# Patient Record
Sex: Female | Born: 1955 | Race: Black or African American | Hispanic: No | Marital: Single | Smoking: Former smoker
Health system: Southern US, Community
[De-identification: ages and names within clinical notes are randomized; demographics above are authoritative.]

## PROBLEM LIST (undated history)

## (undated) DIAGNOSIS — I5020 Unspecified systolic (congestive) heart failure: Secondary | ICD-10-CM

## (undated) DIAGNOSIS — K759 Inflammatory liver disease, unspecified: Secondary | ICD-10-CM

## (undated) DIAGNOSIS — H02829 Cysts of unspecified eye, unspecified eyelid: Secondary | ICD-10-CM

## (undated) DIAGNOSIS — Z992 Dependence on renal dialysis: Secondary | ICD-10-CM

## (undated) DIAGNOSIS — I429 Cardiomyopathy, unspecified: Secondary | ICD-10-CM

## (undated) DIAGNOSIS — W3400XA Accidental discharge from unspecified firearms or gun, initial encounter: Secondary | ICD-10-CM

## (undated) DIAGNOSIS — N186 End stage renal disease: Secondary | ICD-10-CM

## (undated) DIAGNOSIS — I4891 Unspecified atrial fibrillation: Secondary | ICD-10-CM

## (undated) DIAGNOSIS — M1990 Unspecified osteoarthritis, unspecified site: Secondary | ICD-10-CM

## (undated) DIAGNOSIS — M87051 Idiopathic aseptic necrosis of right femur: Secondary | ICD-10-CM

## (undated) DIAGNOSIS — I209 Angina pectoris, unspecified: Secondary | ICD-10-CM

## (undated) DIAGNOSIS — I10 Essential (primary) hypertension: Secondary | ICD-10-CM

## (undated) DIAGNOSIS — R011 Cardiac murmur, unspecified: Secondary | ICD-10-CM

## (undated) DIAGNOSIS — G8929 Other chronic pain: Secondary | ICD-10-CM

## (undated) DIAGNOSIS — G43909 Migraine, unspecified, not intractable, without status migrainosus: Secondary | ICD-10-CM

## (undated) DIAGNOSIS — F419 Anxiety disorder, unspecified: Secondary | ICD-10-CM

## (undated) DIAGNOSIS — E785 Hyperlipidemia, unspecified: Secondary | ICD-10-CM

## (undated) DIAGNOSIS — IMO0001 Reserved for inherently not codable concepts without codable children: Secondary | ICD-10-CM

## (undated) DIAGNOSIS — J40 Bronchitis, not specified as acute or chronic: Secondary | ICD-10-CM

## (undated) DIAGNOSIS — K219 Gastro-esophageal reflux disease without esophagitis: Secondary | ICD-10-CM

## (undated) DIAGNOSIS — G4730 Sleep apnea, unspecified: Secondary | ICD-10-CM

## (undated) DIAGNOSIS — R569 Unspecified convulsions: Secondary | ICD-10-CM

## (undated) DIAGNOSIS — D649 Anemia, unspecified: Secondary | ICD-10-CM

## (undated) DIAGNOSIS — M549 Dorsalgia, unspecified: Secondary | ICD-10-CM

## (undated) DIAGNOSIS — I639 Cerebral infarction, unspecified: Secondary | ICD-10-CM

## (undated) DIAGNOSIS — Z5189 Encounter for other specified aftercare: Secondary | ICD-10-CM

## (undated) HISTORY — DX: Unspecified systolic (congestive) heart failure: I50.20

## (undated) HISTORY — PX: TOTAL KNEE ARTHROPLASTY: SHX125

## (undated) HISTORY — DX: End stage renal disease: N18.6

## (undated) HISTORY — PX: PARTIAL KNEE ARTHROPLASTY: SHX2174

## (undated) HISTORY — PX: EYE SURGERY: SHX253

## (undated) HISTORY — DX: Idiopathic aseptic necrosis of right femur: M87.051

## (undated) HISTORY — PX: KNEE ARTHROPLASTY: SHX992

## (undated) HISTORY — PX: AMPUTATION FINGER / THUMB: SUR24

## (undated) HISTORY — PX: BACK SURGERY: SHX140

## (undated) HISTORY — DX: Cerebral infarction, unspecified: I63.9

## (undated) HISTORY — DX: Dependence on renal dialysis: Z99.2

---

## 1978-10-04 DIAGNOSIS — W3400XA Accidental discharge from unspecified firearms or gun, initial encounter: Secondary | ICD-10-CM

## 1978-10-04 HISTORY — DX: Accidental discharge from unspecified firearms or gun, initial encounter: W34.00XA

## 1979-06-05 HISTORY — PX: OTHER SURGICAL HISTORY: SHX169

## 1979-10-05 HISTORY — PX: PARTIAL HYSTERECTOMY: SHX80

## 2004-10-04 HISTORY — PX: OTHER SURGICAL HISTORY: SHX169

## 2006-02-22 ENCOUNTER — Emergency Department (HOSPITAL_COMMUNITY): Admission: EM | Admit: 2006-02-22 | Discharge: 2006-02-22 | Payer: Self-pay | Admitting: Emergency Medicine

## 2006-10-04 DIAGNOSIS — I639 Cerebral infarction, unspecified: Secondary | ICD-10-CM

## 2006-10-04 HISTORY — DX: Cerebral infarction, unspecified: I63.9

## 2007-07-18 ENCOUNTER — Emergency Department (HOSPITAL_COMMUNITY): Admission: EM | Admit: 2007-07-18 | Discharge: 2007-07-18 | Payer: Self-pay | Admitting: Emergency Medicine

## 2008-02-10 ENCOUNTER — Emergency Department (HOSPITAL_COMMUNITY): Admission: EM | Admit: 2008-02-10 | Discharge: 2008-02-10 | Payer: Self-pay | Admitting: Emergency Medicine

## 2008-12-02 ENCOUNTER — Emergency Department (HOSPITAL_COMMUNITY): Admission: EM | Admit: 2008-12-02 | Discharge: 2008-12-02 | Payer: Self-pay | Admitting: Emergency Medicine

## 2008-12-10 ENCOUNTER — Emergency Department (HOSPITAL_COMMUNITY): Admission: EM | Admit: 2008-12-10 | Discharge: 2008-12-10 | Payer: Self-pay | Admitting: Emergency Medicine

## 2009-01-28 ENCOUNTER — Observation Stay (HOSPITAL_COMMUNITY): Admission: EM | Admit: 2009-01-28 | Discharge: 2009-01-29 | Payer: Self-pay | Admitting: Emergency Medicine

## 2009-03-19 ENCOUNTER — Emergency Department: Payer: Self-pay | Admitting: Emergency Medicine

## 2009-03-29 ENCOUNTER — Inpatient Hospital Stay: Payer: Self-pay | Admitting: Internal Medicine

## 2009-04-29 ENCOUNTER — Emergency Department (HOSPITAL_COMMUNITY): Admission: EM | Admit: 2009-04-29 | Discharge: 2009-04-29 | Payer: Self-pay | Admitting: Emergency Medicine

## 2009-05-05 ENCOUNTER — Ambulatory Visit (HOSPITAL_COMMUNITY): Admission: RE | Admit: 2009-05-05 | Discharge: 2009-05-05 | Payer: Self-pay | Admitting: Family Medicine

## 2009-05-12 ENCOUNTER — Encounter: Payer: Self-pay | Admitting: Family Medicine

## 2009-06-25 ENCOUNTER — Ambulatory Visit: Payer: Self-pay | Admitting: Internal Medicine

## 2009-06-25 ENCOUNTER — Ambulatory Visit: Payer: Self-pay | Admitting: Orthopedic Surgery

## 2009-06-26 ENCOUNTER — Ambulatory Visit: Payer: Self-pay | Admitting: Orthopedic Surgery

## 2009-10-17 ENCOUNTER — Ambulatory Visit: Payer: Self-pay | Admitting: Orthopedic Surgery

## 2009-10-23 ENCOUNTER — Inpatient Hospital Stay: Payer: Self-pay | Admitting: Orthopedic Surgery

## 2010-01-10 ENCOUNTER — Inpatient Hospital Stay: Payer: Self-pay | Admitting: Internal Medicine

## 2010-01-13 ENCOUNTER — Ambulatory Visit: Payer: Self-pay | Admitting: Cardiovascular Disease

## 2010-02-02 ENCOUNTER — Ambulatory Visit: Payer: Self-pay | Admitting: Orthopedic Surgery

## 2010-02-06 ENCOUNTER — Encounter: Admission: RE | Admit: 2010-02-06 | Discharge: 2010-02-06 | Payer: Self-pay | Admitting: Orthopedic Surgery

## 2010-04-27 ENCOUNTER — Emergency Department (HOSPITAL_COMMUNITY): Admission: EM | Admit: 2010-04-27 | Discharge: 2010-04-27 | Payer: Self-pay | Admitting: Emergency Medicine

## 2010-06-10 ENCOUNTER — Emergency Department: Payer: Self-pay | Admitting: Emergency Medicine

## 2010-07-02 ENCOUNTER — Observation Stay: Payer: Self-pay | Admitting: Internal Medicine

## 2010-07-09 ENCOUNTER — Emergency Department: Payer: Self-pay | Admitting: Emergency Medicine

## 2010-07-26 ENCOUNTER — Emergency Department: Payer: Self-pay | Admitting: Emergency Medicine

## 2010-07-29 ENCOUNTER — Ambulatory Visit: Payer: Self-pay | Admitting: Orthopedic Surgery

## 2010-08-03 ENCOUNTER — Ambulatory Visit: Payer: Self-pay | Admitting: Anesthesiology

## 2010-08-04 ENCOUNTER — Inpatient Hospital Stay: Payer: Self-pay | Admitting: Orthopedic Surgery

## 2010-08-06 LAB — PATHOLOGY REPORT

## 2010-10-04 DIAGNOSIS — I639 Cerebral infarction, unspecified: Secondary | ICD-10-CM

## 2010-10-04 HISTORY — DX: Cerebral infarction, unspecified: I63.9

## 2010-10-11 ENCOUNTER — Inpatient Hospital Stay: Payer: Self-pay | Admitting: Internal Medicine

## 2010-10-17 LAB — PATHOLOGY REPORT

## 2010-10-20 ENCOUNTER — Emergency Department: Payer: Self-pay | Admitting: Unknown Physician Specialty

## 2010-10-24 ENCOUNTER — Emergency Department (HOSPITAL_COMMUNITY)
Admission: EM | Admit: 2010-10-24 | Discharge: 2010-10-24 | Payer: Self-pay | Source: Home / Self Care | Admitting: Emergency Medicine

## 2010-10-25 ENCOUNTER — Emergency Department: Payer: Self-pay | Admitting: Emergency Medicine

## 2010-10-27 LAB — POCT CARDIAC MARKERS
CKMB, poc: <1 ng/mL — ABNORMAL LOW (ref 1.0–8.0)
CKMB, poc: <1 ng/mL — ABNORMAL LOW (ref 1.0–8.0)
Myoglobin, poc: 124 ng/mL (ref 12–200)
Troponin i, poc: <0.05 ng/mL (ref 0.00–0.09)

## 2010-10-27 LAB — BASIC METABOLIC PANEL
BUN: 7 mg/dL (ref 6–23)
Chloride: 108 mEq/L (ref 96–112)
Creatinine, Ser: 1.09 mg/dL (ref 0.4–1.2)
GFR calc Af Amer: >60 mL/min (ref >60)

## 2010-10-27 LAB — DIFFERENTIAL
Eosinophils Relative: 1 % (ref 0–5)
Lymphocytes Relative: 20 % (ref 12–46)
Lymphs Abs: 2.3 10*3/uL (ref 0.7–4.0)
Monocytes Absolute: 0.7 10*3/uL (ref 0.1–1.0)
Neutro Abs: 8.4 10*3/uL — ABNORMAL HIGH (ref 1.7–7.7)

## 2010-10-27 LAB — CBC
HCT: 39.4 % (ref 36.0–46.0)
Hemoglobin: 13.2 g/dL (ref 12.0–15.0)
Platelets: 266 10*3/uL (ref 150–400)
RBC: 5.06 MIL/uL (ref 3.87–5.11)

## 2010-11-04 ENCOUNTER — Emergency Department: Payer: Self-pay | Admitting: Emergency Medicine

## 2011-01-09 LAB — LIPID PANEL
HDL: 49 mg/dL (ref >39)
Total CHOL/HDL Ratio: 2.1 RATIO
Triglycerides: 63 mg/dL (ref <150)
VLDL: 13 mg/dL (ref 0–40)

## 2011-01-09 LAB — COMPREHENSIVE METABOLIC PANEL
ALT: 15 U/L (ref 0–35)
AST: 24 U/L (ref 0–37)
Albumin: 4.2 g/dL (ref 3.5–5.2)
CO2: 30 mEq/L (ref 19–32)
Calcium: 9.6 mg/dL (ref 8.4–10.5)
Creatinine, Ser: 1.11 mg/dL (ref 0.4–1.2)
GFR calc Af Amer: >60 mL/min (ref >60)
GFR calc non Af Amer: 51 mL/min — ABNORMAL LOW (ref >60)
Sodium: 142 mEq/L (ref 135–145)
Total Protein: 8 g/dL (ref 6.0–8.3)

## 2011-01-09 LAB — TSH: TSH: 0.993 u[IU]/mL (ref 0.350–4.500)

## 2011-01-09 LAB — CBC
MCHC: 33.3 g/dL (ref 30.0–36.0)
MCV: 80.4 fL (ref 78.0–100.0)
Platelets: 173 10*3/uL (ref 150–400)
RBC: 4.58 MIL/uL (ref 3.87–5.11)

## 2011-01-13 LAB — DIFFERENTIAL
Basophils Relative: 1 % (ref 0–1)
Eosinophils Relative: 2 % (ref 0–5)
Monocytes Absolute: 0.1 10*3/uL (ref 0.1–1.0)
Monocytes Relative: 3 % (ref 3–12)
Neutro Abs: 2.8 10*3/uL (ref 1.7–7.7)

## 2011-01-13 LAB — CBC
HCT: 31 % — ABNORMAL LOW (ref 36.0–46.0)
HCT: 34.3 % — ABNORMAL LOW (ref 36.0–46.0)
Hemoglobin: 10.3 g/dL — ABNORMAL LOW (ref 12.0–15.0)
MCV: 79.7 fL (ref 78.0–100.0)
Platelets: 196 10*3/uL (ref 150–400)
RDW: 17 % — ABNORMAL HIGH (ref 11.5–15.5)
WBC: 4.6 10*3/uL (ref 4.0–10.5)
WBC: 4.8 10*3/uL (ref 4.0–10.5)

## 2011-01-13 LAB — COMPREHENSIVE METABOLIC PANEL
AST: 23 U/L (ref 0–37)
Albumin: 3.7 g/dL (ref 3.5–5.2)
Alkaline Phosphatase: 71 U/L (ref 39–117)
Alkaline Phosphatase: 78 U/L (ref 39–117)
BUN: 12 mg/dL (ref 6–23)
BUN: 14 mg/dL (ref 6–23)
CO2: 26 mEq/L (ref 19–32)
Chloride: 109 mEq/L (ref 96–112)
Chloride: 110 mEq/L (ref 96–112)
GFR calc Af Amer: >60 mL/min (ref >60)
GFR calc non Af Amer: 58 mL/min — ABNORMAL LOW (ref >60)
Glucose, Bld: 105 mg/dL — ABNORMAL HIGH (ref 70–99)
Potassium: 3.4 mEq/L — ABNORMAL LOW (ref 3.5–5.1)
Potassium: 3.8 mEq/L (ref 3.5–5.1)
Sodium: 142 mEq/L (ref 135–145)
Total Bilirubin: 0.2 mg/dL — ABNORMAL LOW (ref 0.3–1.2)
Total Bilirubin: 0.5 mg/dL (ref 0.3–1.2)
Total Protein: 7.2 g/dL (ref 6.0–8.3)

## 2011-01-13 LAB — POCT CARDIAC MARKERS
Myoglobin, poc: 57.8 ng/mL (ref 12–200)
Myoglobin, poc: 63.9 ng/mL (ref 12–200)
Troponin i, poc: <0.05 ng/mL (ref 0.00–0.09)
Troponin i, poc: <0.05 ng/mL (ref 0.00–0.09)
Troponin i, poc: <0.05 ng/mL (ref 0.00–0.09)

## 2011-01-13 LAB — TROPONIN I: Troponin I: <0.01 ng/mL (ref 0.00–0.06)

## 2011-01-14 LAB — DIFFERENTIAL
Eosinophils Absolute: 0 10*3/uL (ref 0.0–0.7)
Lymphocytes Relative: 8 % — ABNORMAL LOW (ref 12–46)
Lymphs Abs: 0.7 10*3/uL (ref 0.7–4.0)
Neutro Abs: 7 10*3/uL (ref 1.7–7.7)
Neutrophils Relative %: 87 % — ABNORMAL HIGH (ref 43–77)

## 2011-01-14 LAB — POCT CARDIAC MARKERS
CKMB, poc: <1 ng/mL — ABNORMAL LOW (ref 1.0–8.0)
Myoglobin, poc: 122 ng/mL (ref 12–200)

## 2011-01-14 LAB — CBC
MCV: 80.7 fL (ref 78.0–100.0)
Platelets: 156 10*3/uL (ref 150–400)
WBC: 8.1 10*3/uL (ref 4.0–10.5)

## 2011-01-22 ENCOUNTER — Emergency Department: Payer: Self-pay | Admitting: Emergency Medicine

## 2011-01-25 ENCOUNTER — Observation Stay: Payer: Self-pay | Admitting: Internal Medicine

## 2011-02-16 NOTE — Discharge Summary (Signed)
NAMESURYA, Audrey Hill           ACCOUNT NO.:  [id number]   MEDICAL RECORD NO.:  [id number]          PATIENT TYPE:  OBV   LOCATION:  3033                         FACILITY:  [facility]   PHYSICIAN:  Helen Hashimoto, MD    DATE OF BIRTH:  09/09/1956   DATE OF ADMISSION:  01/27/2009  DATE OF DISCHARGE:  01/29/2009                               DISCHARGE SUMMARY   DISCHARGE DIAGNOSES:  1. Neck pain most likely secondary to musculoskeletal causes.  2. Depression.  3. Hyperlipidemia.  4. Hypertension.  5. Gastroesophageal reflux disease.  6. History of asthma.   DISCHARGE MEDICATIONS:  1. Vicodin 1-2 tablets p.o. q.4 hours as needed.  2. Prilosec 20 mg once a day.  3. Singulair 10 mg p.o. once daily.  4. Lisinopril same medication same dose taken at home.  5. Lipitor same dose taken at home.  6. Lopressor same dose taken at home.  7. Zoloft same dose taken at home.   CONSULTATIONS:  None.   PROCEDURES:  None.   DIAGNOSTIC STUDIES:  1. Chest x-ray on January 27, 2009 showed cardiomegaly without acute      disease.  2. CT scan of the head without contrast showed no acute problem.  3. MRI of the cervical spine without contrast showed mild to moderate      cervical facet hypertrophy, most pronounced at C3-C4 and C4-C5 and      mild disk degeneration for age and mild-to-moderate multifactorial      neural foraminal stenosis most pronounced at C5 on the left      bilaterally and on C6.  Follow up with primary doctor within 1-2      weeks.   COURSE OF HOSPITALIZATION:  Neck pain.  This is a 55 year old female who  presented to the hospital with diffuse body aches, especially in her  neck.  The patient was admitted to the hospital for further evaluation.  She was started on pain medicine.  The patient improved during her  hospitalization.  At the present time at the time of discharge her pain  is around 2/10 compared to 9/10 at the time of discharge.  The patient  underwent MRI of the  neck and results were mentioned above.  Her current  neck pain might be related to the cervical hypertrophy seen in her MRI.  At the present time, we will treat with oral Vicodin as needed in  addition to physical therapy to her neck.  I discussed those findings  with her and advised her to follow with her primary doctor for further  assessment.   Otherwise, other medical conditions remained stable during this  hospitalization.  The patient was continued on all preadmission  medications.  She was given prescription for 20 tablets of Vicodin..  The patient does not know most of the doses of her medications.  I tried  to get the number of her pharmacy but she does not know which ones she  goes to.  She does not have any bottles with her medications.  At the  present time, I just advised her to continue her current medications in  the same way that prescribed by her physician.   TOTAL ASSESSMENT TIME:  40 minutes.      Helen Hashimoto, MD  Electronically Signed     NAE/MEDQ  D:  01/29/2009  T:  01/29/2009  Job:  [id number]

## 2011-02-16 NOTE — H&P (Signed)
NAMEJAYLINN, CICCARELLI NO.:  [id number]   MEDICAL RECORD NO.:  [id number]          PATIENT TYPE:  OBV   LOCATION:  3033                         FACILITY:  [facility]   PHYSICIAN:  Orpah Melter, MD       DATE OF BIRTH:  Jul 12, 1956   DATE OF ADMISSION:  01/27/2009  DATE OF DISCHARGE:                              HISTORY & PHYSICAL   The patient is unassigned.  She is out of town visiting a relative.   HISTORY OF PRESENT ILLNESS:  The patient is a 55 year old female with  complaints of left neck, left chest, and left leg pain x48 hours prior  to admission.  The patient also reports left leg weakness and  paresthesias intermittently in the 1-2 months prior to admission.  The  patient did have a previous incident of chest pain last year, which was  similar to this, although did not involve her leg.  At that time, the  patient was thought to be suffering from gastroesophageal reflux disease  and therefore did not undergo stress testing.  The patient states they  thought about it.  The patient has had negative enzymes, negative EKG,  negative chest x-ray with the exception of cardiomegaly.  Her symptoms  are more consistent with a radiculopathy than acute coronary syndrome.  I do not believe the patient is suffering from acute coronary syndrome.  The patient will be admitted for observation and to receive a cervical  MRI in a.m.   PAST MEDICAL HISTORY:  1. Obesity.  2. Depression.  3. Prior back surgery in the lumbar area.  4. Gastroesophageal reflux disease.  5. Hyperlipidemia.  6. Hypertension.  7. Anxiety.  8. Asthma.   SOCIAL HISTORY:  The patient continues to smoke and is a current smoker,  but denies alcohol or drug use.   FAMILY HISTORY:  Not available.   REVIEW OF SYSTEMS:  All other comprehensive review of systems are  negative.   MEDICATIONS:  The patient reports allergies to ASPIRIN, which causes GI  upset and NITROGLYCERIN, which causes swelling.   CURRENT MEDICATIONS:  1. Prilosec 20 mg p.o. daily.  2. Singulair 10 mg p.o. daily.  3. Lisinopril.  4. Lipitor.  5. Lopressor.  6. Zoloft, those dosages and frequencies or not available.   PHYSICAL EXAMINATION:  GENERAL:  This is a morbidly obese black female,  currently in no apparent distress.  VITAL SIGNS:  Blood pressure 130/65, heart rate 62, respiratory rate 22,  and temperature 97.  HEENT:  No jugular venous distention or lymphadenopathy.  Oropharynx is  clear.  Mucous membranes pink and moist.  TMs clear bilaterally.  Pupils  equal and reactive to light and accommodation.  Extraocular muscles are  intact.  CARDIOVASCULAR:  Regular rate and rhythm without murmurs, rubs, or  gallops.  PULMONARY:  Lungs are clear to auscultation bilaterally.  ABDOMEN:  Obese, nontender, and nondistended without hepatosplenomegaly.  Bowel sounds are present.  She has no rebound or guarding.  EXTREMITIES:  She has no clubbing, cyanosis, or edema.  She has good  peripheral pulses in dorsalis pedis and radial arteries.  She  is able to  move all extremities.  NEUROLOGIC:  Cranial nerves II-XII are grossly intact.  She has no focal  neurological deficit.  She does have slightly increased reflexes at the  patella with left in comparison to right; however, she has no evidence  of clonus and she has no evidence of weakness.   STUDIES:  1. EKG shows normal sinus rhythm with increased QTc interval and      premature ventricular contraction.  2. Chest x-ray shows no acute disease with the exception of chronic      cardiomegaly.  3. CT of the head shows no acute disease.   LABORATORY DATA:  White count 4.8, H and H 11/34, MCV 80, platelets 196.  Sodium 142, potassium 3.8, chloride 110, bicarb 28, BUN 14, creatinine  0.9, glucose 112.  Troponin I less than 0.05.   ASSESSMENT AND PLAN:  1. Neck, chest, and leg pain x48 hours prior to admission.  I do not      believe this represents acute coronary  syndrome.  We will check      cervical spine in a.m.  2. Depression.  We will hold Zoloft till dosage is available.  The      patient does not know what that is.  3. Hyperlipidemia.  We will hold statin therapy until a.m. until      dosage and frequency are available.  4. Hypertension.  We will hold her Lopressor and lisinopril until a.m.  5. Gastroesophageal reflux disease.  We will give the patient      Protonix.  6. Asthma.  Continue Singulair.   DISPOSITION:  Likely discharge in a.m. after MRI.  I believe the patient  is fixating predominantly on her pain symptoms, which seem to be out of  proportion to examination and physical findings, and this may be a  secondary reason why the patient wishes to be admitted.  We will  reevaluate after further testing in a.m.   H and P was constructed by reviewing past medical history, conferring  with emergency medical room physician, and reviewing the emergency  medical record.  Time spent 1 hour.      Orpah Melter, MD  Electronically Signed     JF/MEDQ  D:  01/28/2009  T:  01/28/2009  Job:  [id number]

## 2011-02-22 ENCOUNTER — Other Ambulatory Visit: Payer: Self-pay | Admitting: Family Medicine

## 2011-02-22 DIAGNOSIS — Z1231 Encounter for screening mammogram for malignant neoplasm of breast: Secondary | ICD-10-CM

## 2011-02-26 ENCOUNTER — Ambulatory Visit: Payer: Self-pay

## 2011-03-09 ENCOUNTER — Ambulatory Visit
Admission: RE | Admit: 2011-03-09 | Discharge: 2011-03-09 | Disposition: A | Discharge status: Home / Self Care | Payer: Medicare Other | Source: Ambulatory Visit | Attending: Family Medicine | Admitting: Family Medicine

## 2011-03-09 DIAGNOSIS — Z1231 Encounter for screening mammogram for malignant neoplasm of breast: Secondary | ICD-10-CM

## 2011-07-05 ENCOUNTER — Ambulatory Visit: Payer: Self-pay | Admitting: Orthopedic Surgery

## 2011-07-15 LAB — I-STAT 8, (EC8 V) (CONVERTED LAB)
Acid-Base Excess: 2
Chloride: 108
HCT: 38
Potassium: 4.3
pH, Ven: 7.524 — ABNORMAL HIGH

## 2011-07-15 LAB — POCT CARDIAC MARKERS
Operator id: 272551
Troponin i, poc: <0.05

## 2011-07-15 LAB — D-DIMER, QUANTITATIVE: D-Dimer, Quant: <0.22

## 2011-07-15 LAB — POCT I-STAT CREATININE: Creatinine, Ser: 1.1

## 2011-08-23 ENCOUNTER — Other Ambulatory Visit: Payer: Self-pay

## 2011-08-23 ENCOUNTER — Encounter: Payer: Self-pay | Admitting: *Deleted

## 2011-08-23 ENCOUNTER — Inpatient Hospital Stay (HOSPITAL_COMMUNITY)
Admission: EM | Admit: 2011-08-23 | Discharge: 2011-08-24 | DRG: 313 | Disposition: A | Discharge status: Home / Self Care | Payer: Medicare Other | Attending: Family Medicine | Admitting: Family Medicine

## 2011-08-23 ENCOUNTER — Emergency Department (HOSPITAL_COMMUNITY): Payer: Medicare Other

## 2011-08-23 DIAGNOSIS — R072 Precordial pain: Principal | ICD-10-CM | POA: Diagnosis present

## 2011-08-23 DIAGNOSIS — H02829 Cysts of unspecified eye, unspecified eyelid: Secondary | ICD-10-CM

## 2011-08-23 DIAGNOSIS — E785 Hyperlipidemia, unspecified: Secondary | ICD-10-CM | POA: Insufficient documentation

## 2011-08-23 DIAGNOSIS — I10 Essential (primary) hypertension: Secondary | ICD-10-CM | POA: Diagnosis present

## 2011-08-23 DIAGNOSIS — F419 Anxiety disorder, unspecified: Secondary | ICD-10-CM | POA: Insufficient documentation

## 2011-08-23 DIAGNOSIS — F17200 Nicotine dependence, unspecified, uncomplicated: Secondary | ICD-10-CM

## 2011-08-23 DIAGNOSIS — R0602 Shortness of breath: Secondary | ICD-10-CM

## 2011-08-23 DIAGNOSIS — J45909 Unspecified asthma, uncomplicated: Secondary | ICD-10-CM | POA: Insufficient documentation

## 2011-08-23 DIAGNOSIS — E039 Hypothyroidism, unspecified: Secondary | ICD-10-CM | POA: Diagnosis present

## 2011-08-23 DIAGNOSIS — E669 Obesity, unspecified: Secondary | ICD-10-CM

## 2011-08-23 DIAGNOSIS — M25569 Pain in unspecified knee: Secondary | ICD-10-CM | POA: Diagnosis present

## 2011-08-23 DIAGNOSIS — R569 Unspecified convulsions: Secondary | ICD-10-CM | POA: Insufficient documentation

## 2011-08-23 DIAGNOSIS — K219 Gastro-esophageal reflux disease without esophagitis: Secondary | ICD-10-CM | POA: Insufficient documentation

## 2011-08-23 DIAGNOSIS — R0789 Other chest pain: Secondary | ICD-10-CM | POA: Diagnosis present

## 2011-08-23 DIAGNOSIS — K759 Inflammatory liver disease, unspecified: Secondary | ICD-10-CM | POA: Insufficient documentation

## 2011-08-23 DIAGNOSIS — Z79899 Other long term (current) drug therapy: Secondary | ICD-10-CM

## 2011-08-23 HISTORY — DX: Migraine, unspecified, not intractable, without status migrainosus: G43.909

## 2011-08-23 HISTORY — DX: Anemia, unspecified: D64.9

## 2011-08-23 HISTORY — DX: Essential (primary) hypertension: I10

## 2011-08-23 HISTORY — DX: Cysts of unspecified eye, unspecified eyelid: H02.829

## 2011-08-23 HISTORY — DX: Encounter for other specified aftercare: Z51.89

## 2011-08-23 HISTORY — DX: Gastro-esophageal reflux disease without esophagitis: K21.9

## 2011-08-23 HISTORY — DX: Inflammatory liver disease, unspecified: K75.9

## 2011-08-23 HISTORY — DX: Anxiety disorder, unspecified: F41.9

## 2011-08-23 HISTORY — DX: Bronchitis, not specified as acute or chronic: J40

## 2011-08-23 HISTORY — DX: Reserved for inherently not codable concepts without codable children: IMO0001

## 2011-08-23 HISTORY — DX: Hyperlipidemia, unspecified: E78.5

## 2011-08-23 HISTORY — DX: Unspecified convulsions: R56.9

## 2011-08-23 HISTORY — DX: Accidental discharge from unspecified firearms or gun, initial encounter: W34.00XA

## 2011-08-23 HISTORY — DX: Angina pectoris, unspecified: I20.9

## 2011-08-23 LAB — CARDIAC PANEL(CRET KIN+CKTOT+MB+TROPI)
CK, MB: 2.1 ng/mL (ref 0.3–4.0)
Total CK: 198 U/L — ABNORMAL HIGH (ref 7–177)
Troponin I: <0.3 ng/mL (ref <0.30)

## 2011-08-23 LAB — POCT I-STAT, CHEM 8
BUN: 8 mg/dL (ref 6–23)
Calcium, Ion: 1.16 mmol/L (ref 1.12–1.32)
Chloride: 107 mEq/L (ref 96–112)
Creatinine, Ser: 0.9 mg/dL (ref 0.50–1.10)
Glucose, Bld: 77 mg/dL (ref 70–99)

## 2011-08-23 LAB — CBC
HCT: 36.8 % (ref 36.0–46.0)
MCV: 80 fL (ref 78.0–100.0)
RBC: 4.6 MIL/uL (ref 3.87–5.11)
WBC: 4.8 10*3/uL (ref 4.0–10.5)

## 2011-08-23 LAB — DIFFERENTIAL
Eosinophils Relative: 2 % (ref 0–5)
Lymphocytes Relative: 39 % (ref 12–46)
Lymphs Abs: 1.9 10*3/uL (ref 0.7–4.0)
Monocytes Absolute: 0.2 10*3/uL (ref 0.1–1.0)
Monocytes Relative: 5 % (ref 3–12)

## 2011-08-23 MED ORDER — FUROSEMIDE 20 MG PO TABS
20.0000 mg | ORAL_TABLET | Freq: Every day | ORAL | Status: DC
  Administered 2011-08-24: 20 mg via ORAL
  Filled 2011-08-23: qty 1

## 2011-08-23 MED ORDER — SODIUM CHLORIDE 0.9 % IJ SOLN: 3.0000 mL | INTRAMUSCULAR | Status: DC | PRN

## 2011-08-23 MED ORDER — SODIUM CHLORIDE 0.9 % IV SOLN
250.0000 mL | INTRAVENOUS | Status: DC
  Administered 2011-08-23: 250 mL via INTRAVENOUS

## 2011-08-23 MED ORDER — METOPROLOL TARTRATE 1 MG/ML IV SOLN: 5.0000 mg | INTRAVENOUS | Status: DC | PRN

## 2011-08-23 MED ORDER — POTASSIUM CHLORIDE CRYS ER 20 MEQ PO TBCR
40.0000 meq | EXTENDED_RELEASE_TABLET | Freq: Once | ORAL | Status: AC
  Administered 2011-08-23: 40 meq via ORAL
  Filled 2011-08-23: qty 1

## 2011-08-23 MED ORDER — MORPHINE SULFATE 4 MG/ML IJ SOLN: 4.0000 mg | Freq: Once | INTRAMUSCULAR | Status: DC

## 2011-08-23 MED ORDER — SODIUM CHLORIDE 0.9 % IJ SOLN
3.0000 mL | Freq: Two times a day (BID) | INTRAMUSCULAR | Status: DC
  Administered 2011-08-23 – 2011-08-24 (×2): 3 mL via INTRAVENOUS

## 2011-08-23 MED ORDER — AMLODIPINE BESYLATE 10 MG PO TABS
10.0000 mg | ORAL_TABLET | Freq: Every day | ORAL | Status: DC
  Administered 2011-08-24: 10 mg via ORAL
  Filled 2011-08-23: qty 1

## 2011-08-23 MED ORDER — HEPARIN BOLUS VIA INFUSION
4000.0000 [IU] | Freq: Once | INTRAVENOUS | Status: AC
  Administered 2011-08-23: 4000 [IU] via INTRAVENOUS
  Filled 2011-08-23: qty 4000

## 2011-08-23 MED ORDER — NITROGLYCERIN 2 % TD OINT: 0.5000 [in_us] | TOPICAL_OINTMENT | Freq: Four times a day (QID) | TRANSDERMAL | Status: DC

## 2011-08-23 MED ORDER — SIMVASTATIN 20 MG PO TABS
20.0000 mg | ORAL_TABLET | Freq: Every day | ORAL | Status: DC
  Filled 2011-08-23: qty 1

## 2011-08-23 MED ORDER — ONDANSETRON HCL 4 MG/2ML IJ SOLN
4.0000 mg | Freq: Four times a day (QID) | INTRAMUSCULAR | Status: DC | PRN
  Administered 2011-08-24 (×2): 4 mg via INTRAVENOUS
  Filled 2011-08-23 (×2): qty 2

## 2011-08-23 MED ORDER — TEMAZEPAM 15 MG PO CAPS
15.0000 mg | ORAL_CAPSULE | Freq: Every evening | ORAL | Status: DC | PRN
  Administered 2011-08-23: 15 mg via ORAL
  Filled 2011-08-23: qty 1

## 2011-08-23 MED ORDER — ONDANSETRON HCL 4 MG/2ML IJ SOLN
4.0000 mg | INTRAMUSCULAR | Status: DC | PRN
  Administered 2011-08-23: 4 mg via INTRAVENOUS
  Filled 2011-08-23: qty 2

## 2011-08-23 MED ORDER — MORPHINE SULFATE 4 MG/ML IJ SOLN
4.0000 mg | INTRAMUSCULAR | Status: DC | PRN
  Administered 2011-08-23 – 2011-08-24 (×4): 4 mg via INTRAVENOUS
  Filled 2011-08-23 (×4): qty 1

## 2011-08-23 MED ORDER — MORPHINE SULFATE 4 MG/ML IJ SOLN
6.0000 mg | INTRAMUSCULAR | Status: AC | PRN
  Administered 2011-08-23: 6 mg via INTRAVENOUS
  Administered 2011-08-23: 4 mg via INTRAVENOUS
  Filled 2011-08-23: qty 1
  Filled 2011-08-23: qty 2

## 2011-08-23 MED ORDER — POTASSIUM CHLORIDE 20 MEQ PO PACK: 40.0000 meq | PACK | Freq: Once | ORAL | Status: DC

## 2011-08-23 MED ORDER — HEPARIN (PORCINE) IN NACL 100-0.45 UNIT/ML-% IJ SOLN
1000.0000 [IU]/h | INTRAMUSCULAR | Status: DC
  Administered 2011-08-23 (×2): 1000 [IU]/h via INTRAVENOUS
  Filled 2011-08-23: qty 250

## 2011-08-23 MED ORDER — NICOTINE 7 MG/24HR TD PT24
7.0000 mg | MEDICATED_PATCH | Freq: Every day | TRANSDERMAL | Status: DC
  Administered 2011-08-23 – 2011-08-24 (×2): 7 mg via TRANSDERMAL
  Filled 2011-08-23 (×2): qty 1

## 2011-08-23 MED ORDER — ACETAMINOPHEN 325 MG PO TABS
650.0000 mg | ORAL_TABLET | ORAL | Status: DC | PRN
  Filled 2011-08-23: qty 2

## 2011-08-23 MED ORDER — GI COCKTAIL ~~LOC~~
30.0000 mL | Freq: Once | ORAL | Status: AC
  Administered 2011-08-23: 30 mL via ORAL
  Filled 2011-08-23: qty 30

## 2011-08-23 NOTE — ED Notes (Signed)
Pt here with c/o left knee pain that started last night and has become worse.  Pt reports knee replacement 1 yr pain and ago.  Since her arrival, pt also c/o center chest pain as well as sob.  Pt demies n/v and diaphoresis.  Pt rates pain 8/10.
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
## 2011-08-23 NOTE — ED Notes (Signed)
To ed for eval of cp and left knee pain. Pt was coming to the hospital for the knee pain when she became sob and her chest started to hurt.

## 2011-08-23 NOTE — ED Notes (Signed)
Pt. Placed on pulse ox and heart monitor
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
## 2011-08-23 NOTE — Progress Notes (Signed)
*  PRELIMINARY RESULTS*  Left Lower Extremity Venous Duplex has been performed. No obvious DVT seen in Left leg.  Landry Mellow 08/23/2011, 2:05 PM

## 2011-08-23 NOTE — ED Provider Notes (Signed)
History     CSN: [id number] Arrival date & time: 08/23/2011 11:41 AM   First MD Initiated Contact with Patient 08/23/11 1207      Chief Complaint  Patient presents with  . Chest Pain  . Knee Pain    (Consider location/radiation/quality/duration/timing/severity/associated sxs/prior treatment) HPI Comments: The patient presents with chief complaint of chest pain with a history significant of hypertension, hyperlipidemia, and smoking x30 years. Patient states her chest pain started earlier today about an hour before arrival. The pain has been constant and squeezing in nature. The pain radiates down her left arm. The patient states she's never had pain like this before. Patient denies having a cardiac workup in the past. She's never had a stress test or echo. Patient's primary care doctor is Dr. Kennon Holter but the patient does not have a cardiologist. Patient reports new onset of shortness of breath, dyspnea on exertion, diaphoresis but denies palpitations, syncope, PND, hemoptysis, abdominal pain or history of COPD. She also denies a history of unilateral leg swelling any peripheral edema and history of clotting. In addition the patient complains of left knee pain. 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 She states that she's had pain since the surgery however in addition to this she is having some lower leg calf pain that is sharp. Patient has no other chief complaints.  Patient is a 55 y.o. female presenting with chest pain and knee pain. The history is provided by the patient.  Chest Pain Primary symptoms include shortness of breath. Pertinent negatives for primary symptoms include no fever, no fatigue, no cough, no wheezing, no palpitations, no abdominal pain, no nausea, no vomiting and no dizziness.  Pertinent negatives for associated symptoms include no diaphoresis.    Knee Pain Associated symptoms include chest pain. Pertinent negatives include no abdominal pain, chills, congestion,  coughing, diaphoresis, fatigue, fever, headaches, myalgias, nausea, neck pain or vomiting.    Past Medical History  Diagnosis Date  . Hypertension     History reviewed. No pertinent past surgical history.  History reviewed. No pertinent family history.  History  Substance Use Topics  . Smoking status: Not on file  . Smokeless tobacco: Not on file  . Alcohol Use:     OB History    Grav Para Term Preterm Abortions TAB SAB Ect Mult Living                  Review of Systems  Constitutional: Positive for activity change. Negative for fever, chills, diaphoresis, fatigue and unexpected weight change.  HENT: Negative for congestion, neck pain and neck stiffness.   Eyes: Negative for visual disturbance.  Respiratory: Positive for shortness of breath. Negative for apnea, cough, chest tightness, wheezing and stridor.   Cardiovascular: Positive for chest pain. Negative for palpitations and leg swelling.  Gastrointestinal: Negative for nausea, vomiting, abdominal pain, diarrhea and blood in stool.  Genitourinary: Negative for dysuria, urgency, hematuria and flank pain.  Musculoskeletal: Positive for gait problem. Negative for myalgias and back pain.  Skin: Negative for pallor.  Neurological: Negative for dizziness, syncope, light-headedness and headaches.  Hematological: Does not bruise/bleed easily.  All other systems reviewed and are negative.    Allergies  Benadryl; Ibuprofen; and Nitroglycerin  Home Medications   Current Outpatient Rx  Name Route Sig Dispense Refill  . AMLODIPINE BESYLATE 10 MG PO TABS Oral Take 10 mg by mouth daily.      . FUROSEMIDE 20 MG PO TABS Oral Take 20 mg by mouth  daily.      Marland Kitchen NABUMETONE 500 MG PO TABS Oral Take 500 mg by mouth 2 (two) times daily.      Marland Kitchen OMEPRAZOLE 20 MG PO CPDR Oral Take 20 mg by mouth daily.      Marland Kitchen PRAVASTATIN SODIUM 40 MG PO TABS Oral Take 40 mg by mouth daily.      Marland Kitchen TEMAZEPAM 15 MG PO CAPS Oral Take 15 mg by mouth at  bedtime as needed. For sleep     . TRAMADOL HCL 50 MG PO TABS Oral Take 50 mg by mouth at bedtime as needed. For sleep. Maximum dose= 8 tablets per day       BP 143/70  Pulse 84  Temp(Src) 98.4 F (36.9 C) (Oral)  Resp 15  SpO2 97%  Physical Exam  Nursing note and vitals reviewed. Constitutional: She is oriented to person, place, and time. She appears well-developed and well-nourished. No distress.  HENT:  Head: Normocephalic and atraumatic.  Eyes: Conjunctivae and EOM are normal.       Normal appearance  Neck: Normal range of motion.  Cardiovascular: Normal rate, regular rhythm, normal heart sounds and intact distal pulses.   Pulmonary/Chest: Effort normal and breath sounds normal. No respiratory distress. She exhibits no tenderness.  Abdominal: Soft. Bowel sounds are normal. She exhibits no distension. There is no tenderness. There is no guarding.  Musculoskeletal: She exhibits no edema and no tenderness.       Left knee: She exhibits decreased range of motion. She exhibits no swelling, no effusion and no ecchymosis. tenderness found.       Legs:      2+ Dorsalis Pedis pulses  Neurological: She is alert and oriented to person, place, and time.  Skin: Skin is warm and dry. No rash noted. She is not diaphoretic.  Psychiatric: She has a normal mood and affect. Her behavior is normal.    ED Course  Procedures (including critical care time)   Labs Reviewed  CBC  DIFFERENTIAL  TROPONIN I  I-STAT TROPONIN I  I-STAT, CHEM 8   Dg Chest 2 View  08/23/2011  *RADIOLOGY REPORT*  Clinical Data: Chest pain and weakness.  CHEST - 2 VIEW  Comparison: 10/24/2010.  Findings: Trachea is midline.  Heart is mildly enlarged, stable. Lungs are clear.  No pleural fluid.  Metallic densities project over the upper chest, as before.  IMPRESSION: No acute findings.  Original Report Authenticated By: Luretha Rued, M.D.   Patient's blood hemolyzed in the lab 2 times before being able to results  of the troponin and the i-STAT chem 8. This is prolonged the patient's ED visit and my ability to admit the patient. This has been discussed with many ladders while the lab downstairs.  Note the patient has a family history significant for multiple cardiac ischemic attacks. Her mother had her first heart attack at 70 being killed by a massive MI at 84 and her brother age 74 just had a triple bypass surgery  Patient is currently hemodynamically stable, not in pain, and has no complaints  Admit pt to family practice; Spoke w Dr.Williamson telemetry bed, CP/SOB, in pt   MDM  Chest pain  SOB        [place], [place] 08/23/11 1757

## 2011-08-23 NOTE — ED Notes (Signed)
Ordered pt a dinner tray, heart healthy

## 2011-08-24 ENCOUNTER — Other Ambulatory Visit: Payer: Self-pay

## 2011-08-24 DIAGNOSIS — E669 Obesity, unspecified: Secondary | ICD-10-CM

## 2011-08-24 DIAGNOSIS — R0789 Other chest pain: Secondary | ICD-10-CM

## 2011-08-24 LAB — CBC
HCT: 34.1 % — ABNORMAL LOW (ref 36.0–46.0)
Hemoglobin: 11.2 g/dL — ABNORMAL LOW (ref 12.0–15.0)
MCH: 26.6 pg (ref 26.0–34.0)
MCHC: 32.8 g/dL (ref 30.0–36.0)

## 2011-08-24 LAB — CARDIAC PANEL(CRET KIN+CKTOT+MB+TROPI)
CK, MB: 2.3 ng/mL (ref 0.3–4.0)
CK, MB: 2.3 ng/mL (ref 0.3–4.0)
Relative Index: 1.4 (ref 0.0–2.5)
Total CK: 180 U/L — ABNORMAL HIGH (ref 7–177)

## 2011-08-24 LAB — DRUGS OF ABUSE SCREEN W/O ALC, ROUTINE URINE
Amphetamine Screen, Ur: NEGATIVE
Benzodiazepines.: NEGATIVE
Marijuana Metabolite: NEGATIVE
Methadone: NEGATIVE
Opiate Screen, Urine: POSITIVE — AB
Phencyclidine (PCP): NEGATIVE

## 2011-08-24 LAB — BASIC METABOLIC PANEL
BUN: 13 mg/dL (ref 6–23)
Chloride: 106 mEq/L (ref 96–112)
Glucose, Bld: 99 mg/dL (ref 70–99)
Potassium: 3.4 mEq/L — ABNORMAL LOW (ref 3.5–5.1)

## 2011-08-24 LAB — TSH: TSH: 4.797 u[IU]/mL — ABNORMAL HIGH (ref 0.350–4.500)

## 2011-08-24 LAB — LIPID PANEL
Cholesterol: 170 mg/dL (ref 0–200)
VLDL: 36 mg/dL (ref 0–40)

## 2011-08-24 LAB — HEMOGLOBIN A1C: Hgb A1c MFr Bld: 5.7 % — ABNORMAL HIGH (ref <5.7)

## 2011-08-24 NOTE — Discharge Summary (Signed)
I have seen and examined this patient. I have discussed with Dr Williamson.  I agree with their findings and plans as documented in their discharge note for today.  

## 2011-08-24 NOTE — Consult Note (Signed)
Pt smokes 5 cigarettes per day. She is in contemplation stage and  says she'll eventually quit on her own but not setting a quit date now. Pt voices understanding of risk factors. Referred to 1-800 quit now for f/u and support. Discussed oral fixation substitutes, second hand smoke and in home smoking policy. Reviewed and gave pt Written education/contact information.

## 2011-08-24 NOTE — Progress Notes (Signed)
Daily Progress Note Audrey Hill. Maricela Bo, M.D., M.B.A  Family Medicine PGY-1     Subjective: Denies chest pain, shortness of breath; Endorses left knee pain that is acute on chronic, mild nausea; patient not using supplemental oxygen   Objective: Vital signs in last 24 hours: Temp:  [98.1 F (36.7 C)-98.4 F (36.9 C)] 98.1 F (36.7 C) (11/20 0515) Pulse Rate:  [69-85] 69  (11/20 0515) Resp:  [15-20] 20  (11/20 0515) BP: (114-145)/(64-98) 135/79 mmHg (11/20 0515) SpO2:  [94 %-100 %] 94 % (11/20 0515) Weight:  [222 lb 0.1 oz (100.7 kg)-222 lb 3.6 oz (100.8 kg)] 222 lb 0.1 oz (100.7 kg) (11/20 0515) Weight change:  Last BM Date: 08/22/11   Intake/Output from previous day: 11/19 0701 - 11/20 0700 In: 422.3 [P.O.:240; I.V.:182.3] Out: -  Intake/Output this shift:    General appearance: alert, cooperative, no distress and mildly obese Resp: clear to auscultation bilaterally Chest wall: no tenderness Cardio: regular rate and rhythm, S1, S2 normal, no murmur, click, rub or gallop GI: soft, non-tender; bowel sounds normal; no masses,  no organomegaly Extremities: edema none and left knee with large lateral scar, crepitus, and pain with AROM, no edema or erythema or redness Throat: no thyromegaly or nodules appreciated   Lab Results:  Basename 08/24/11 0640 08/23/11 1711 08/23/11 1247  WBC 4.1 -- 4.8  HGB 11.2* 13.3 --  HCT 34.1* 39.0 --  PLT 200 -- 222   Trop I < 0.30 x 3 TSH: 4.797 - high  UDS - pending  HbA1C - 5.7  Lipid Panel     Component Value Date/Time   CHOL 170 08/24/2011 0640   TRIG 182* 08/24/2011 0640   HDL 56 08/24/2011 0640   CHOLHDL 3.0 08/24/2011 0640   VLDL 36 08/24/2011 0640   LDLCALC 78 08/24/2011 0640      Studies/Results:  EKG from 11/19 - regular rate, occassional PVC's, non-specific ST changes  EKG 08/24/11 - rate 64 BPM. Sinus rhythm, LVH, non-specific ST changes   Medications:  Scheduled:   . amLODipine  10 mg Oral Daily  .  furosemide  20 mg Oral Daily  . gi cocktail  30 mL Oral Once  . heparin  4,000 Units Intravenous Once  . nicotine  7 mg Transdermal Daily  . potassium chloride  40 mEq Oral Once  . simvastatin  20 mg Oral q1800  . sodium chloride  3 mL Intravenous Q12H  . DISCONTD:  morphine injection  4 mg Intravenous Once  . DISCONTD: nitroGLYCERIN  0.5 inch Topical Q6H  . DISCONTD: potassium chloride  40 mEq Oral Once   [id number], metoprolol, morphine injection, morphine injection, ondansetron (ZOFRAN) IV, sodium chloride, temazepam, DISCONTD: ondansetron  Assessment/Plan: 55 year old female with multiple cardiac disease risk factors who presented after acute-onset crushing chest pain that is being evaluated to ACS.   1. Chest Pain: negative troponin x 3, no evidence of MI on EKG; pain likely angina; [place] 10 year  risk of developing CV disease is approximately 12%; patient likely needs outpatient cardiology follow-up   - D/C heparin drip  - d/c morphine  2. Hypothyroidism: consider starting levothyroxine  3. HTN - continue home doses of amlodipine and lasix; Metoprolol PRN for SBP > 180; HTN well controlled 4. HLD - continue home crestor ; lipids WNL  5. Left Knee Pain - likely long standing 2/2 surgery, encouraged to follow-up with ortho surgeon in [place]  6. Dispo - likely d/c this afternoon after cardiology follow-up  established      LOS: 1 day   Dewain Penning 08/24/2011, 7:57 AM

## 2011-08-24 NOTE — Progress Notes (Signed)
D/c pt's tele, d/c pt's IV. Patient verbalizes understanding of discharge instructions and medications.___________________________________________________________________________________________________D. Owens Shark RN

## 2011-08-24 NOTE — Discharge Summary (Signed)
Physician Discharge Summary  Patient ID: Audrey Hill MRN: [id number] DOB/AGE: Mar 09, 1956 55 y.o. PCP: Lindwood Qua, M.D. Cardiologist: Lendell Caprice, M.D.    Admit date: 08/23/2011 Discharge date: 08/24/2011  Admission Diagnoses: Chest Pain   Discharge Diagnoses:  Principal Problem:  *Chest pain, midsternal Active Problems:  Hypertension  Hyperlipidemia  Active smoker  Obesity (BMI 30-39.9)   Discharged Condition: good  Hospital Course: Audrey Hill is a 55 year old female who had acute onset chest pain that radiated to her jaw, left arm and left leg. It was accompanied by shortness of breath, diaphoresis, and vomiting. She denies syncope, but says she stumbled. She came to the ED at [facility] and was treated wit morphine for pain, but was not given nitroglycerin or aspirin secondary to allergies. An ECG did not show any evidence of acute ischemia. An spiral CT ruled out pulmonary embolism. The patient was admitted to the family medicine teaching service and treated with an IV heparin drip given persistent chest pain. Additionally, the patient was given a GI cocktail. A work-up for acute coronary syndrome included Troponin I negative x 3. A follow-up ultrasound demonstrated lateral ST wave inversions. Her lipid panel was within normal limits. However, her TSH was elevated to 4.797. A free T-4 was ordered, but no results was present upon discharge. The patient also met with a smoking cessation counselor. Audrey Hill was pain free upon hospital day 2 and was appropriate for discharge.   Given her concerning presentation as well as her risk facts of obesity, hypertension, hyperlipidemia, 35 pack year smoking history, family history of MI, and possible hypothyroidism, it is appropriate that Audrey Hill follow-up with a cardiologist. She has been scheduled to see Dr. Irish Lack at [facility] on 09/10/11. She will follow-up with Dr. Kennon Holter. However, if he is not available, then she  call [facility] to schedule follow-up.   Consults: Smoking Cessation Counselor   Significant Diagnostic Studies:  Troponin I , 0.30 TSH: 4.797 Free T-4: Pending (needs follow-up)   Lower Extremity Dopplers Summary: No evidence of deep vein or superficial thrombosis involving the left lower extremity and right common femoral vein.  CT ANGIOGRAPHY CHEST WITH CONTRAST   IMPRESSION:  1. No pulmonary emboli.  2. Cardiomegaly.   Treatments: please read above   Discharge Exam: Blood pressure 123/72, pulse 69, temperature 98.3 F (36.8 C), temperature source Oral, resp. rate 18, height 5\' 7"  (1.702 m), weight 222 lb 0.1 oz (100.7 kg), SpO2 98.00%. General appearance: alert, cooperative, no distress and mildly obese  Resp: clear to auscultation bilaterally  Chest wall: no tenderness  Cardio: regular rate and rhythm, S1, S2 normal, no murmur, click, rub or gallop  GI: soft, non-tender; bowel sounds normal; no masses, no organomegaly  Extremities: edema none and left knee with large lateral scar, crepitus, and pain with AROM, no edema or erythema or redness  Throat: no thyromegaly or nodules appreciated   Disposition:   Discharge Orders    Future Orders Please Complete By Expires   Diet - low sodium heart healthy      Increase activity slowly        Current Discharge Medication List    CONTINUE these medications which have NOT CHANGED   Details  amLODipine (NORVASC) 10 MG tablet Take 10 mg by mouth daily.      furosemide (LASIX) 20 MG tablet Take 20 mg by mouth daily.      omeprazole (PRILOSEC) 20 MG capsule Take 20 mg by mouth  daily.      pravastatin (PRAVACHOL) 40 MG tablet Take 40 mg by mouth daily.      traMADol (ULTRAM) 50 MG tablet Take 50 mg by mouth at bedtime as needed. For sleep. Maximum dose= 8 tablets per day     temazepam (RESTORIL) 15 MG capsule Take 15 mg by mouth at bedtime as needed. For sleep       STOP taking these medications      nabumetone (RELAFEN) 500 MG tablet        Follow-up Information    Follow up with [facility]. Make an appointment in 2 weeks.   Contact information:   Individual [address] [address] [place] [phone]       Follow up with [facility] on 09/10/2011. (appt at 10:00 AM - if you cannot make it, then please cancel)    Contact information:   [place] [phone]         Signed: Dewain Penning 08/24/2011, 3:57 PM

## 2011-08-24 NOTE — ED Provider Notes (Signed)
Medical screening examination/treatment/procedure(s) were performed by non-physician practitioner and as supervising physician I was immediately available for consultation/collaboration.  Jasper Riling. Alvino Chapel, MD 08/24/11 AN:[id number]

## 2011-08-28 LAB — OPIATE, QUANTITATIVE, URINE
Hydrocodone: NEGATIVE NG/ML
Hydromorphone GC/MS Conf: NEGATIVE NG/ML
Oxycodone, ur: NEGATIVE NG/ML
Oxymorphone: NEGATIVE NG/ML

## 2011-09-14 ENCOUNTER — Other Ambulatory Visit: Payer: Self-pay | Admitting: Interventional Cardiology

## 2011-09-14 ENCOUNTER — Encounter (HOSPITAL_COMMUNITY): Payer: Self-pay

## 2011-09-15 ENCOUNTER — Encounter (HOSPITAL_COMMUNITY): Admission: RE | Payer: Self-pay | Source: Ambulatory Visit

## 2011-09-15 ENCOUNTER — Ambulatory Visit (HOSPITAL_COMMUNITY)
Admission: RE | Admit: 2011-09-15 | Payer: Medicare Other | Source: Ambulatory Visit | Admitting: Interventional Cardiology

## 2011-09-15 ENCOUNTER — Encounter (HOSPITAL_COMMUNITY): Payer: Self-pay | Admitting: General Practice

## 2011-09-15 ENCOUNTER — Ambulatory Visit (HOSPITAL_COMMUNITY)
Admission: RE | Admit: 2011-09-15 | Discharge: 2011-09-16 | Disposition: A | Discharge status: Home / Self Care | Payer: Medicare Other | Source: Ambulatory Visit | Attending: Interventional Cardiology | Admitting: Interventional Cardiology

## 2011-09-15 ENCOUNTER — Encounter (HOSPITAL_COMMUNITY)
Admission: RE | Disposition: A | Discharge status: Home / Self Care | Payer: Self-pay | Source: Ambulatory Visit | Attending: Interventional Cardiology

## 2011-09-15 DIAGNOSIS — F17200 Nicotine dependence, unspecified, uncomplicated: Secondary | ICD-10-CM | POA: Insufficient documentation

## 2011-09-15 DIAGNOSIS — G8929 Other chronic pain: Secondary | ICD-10-CM | POA: Insufficient documentation

## 2011-09-15 DIAGNOSIS — E669 Obesity, unspecified: Secondary | ICD-10-CM | POA: Insufficient documentation

## 2011-09-15 DIAGNOSIS — I10 Essential (primary) hypertension: Secondary | ICD-10-CM | POA: Insufficient documentation

## 2011-09-15 DIAGNOSIS — R079 Chest pain, unspecified: Secondary | ICD-10-CM | POA: Insufficient documentation

## 2011-09-15 DIAGNOSIS — Z23 Encounter for immunization: Secondary | ICD-10-CM | POA: Insufficient documentation

## 2011-09-15 HISTORY — DX: Cardiac murmur, unspecified: R01.1

## 2011-09-15 HISTORY — DX: Unspecified osteoarthritis, unspecified site: M19.90

## 2011-09-15 HISTORY — PX: CARDIAC CATHETERIZATION: SHX172

## 2011-09-15 HISTORY — PX: LEFT HEART CATHETERIZATION WITH CORONARY ANGIOGRAM: SHX5451

## 2011-09-15 SURGERY — LEFT HEART CATHETERIZATION WITH CORONARY ANGIOGRAM
Anesthesia: LOCAL

## 2011-09-15 SURGERY — LEFT HEART CATHETERIZATION WITH CORONARY ANGIOGRAM
Anesthesia: Moderate Sedation

## 2011-09-15 MED ORDER — MORPHINE SULFATE 2 MG/ML IJ SOLN
1.0000 mg | INTRAMUSCULAR | Status: DC | PRN
  Administered 2011-09-15 (×2): 1 mg via INTRAVENOUS

## 2011-09-15 MED ORDER — INFLUENZA VIRUS VACC SPLIT PF IM SUSP
0.5000 mL | INTRAMUSCULAR | Status: AC
  Administered 2011-09-16: 0.5 mL via INTRAMUSCULAR
  Filled 2011-09-15: qty 0.5

## 2011-09-15 MED ORDER — PANTOPRAZOLE SODIUM 40 MG PO TBEC
40.0000 mg | DELAYED_RELEASE_TABLET | Freq: Every day | ORAL | Status: DC
  Administered 2011-09-15 – 2011-09-16 (×2): 40 mg via ORAL
  Filled 2011-09-15: qty 1

## 2011-09-15 MED ORDER — PNEUMOCOCCAL VAC POLYVALENT 25 MCG/0.5ML IJ INJ
0.5000 mL | INJECTION | INTRAMUSCULAR | Status: AC
  Administered 2011-09-16: 0.5 mL via INTRAMUSCULAR
  Filled 2011-09-15: qty 0.5

## 2011-09-15 MED ORDER — MORPHINE SULFATE 4 MG/ML IJ SOLN
INTRAMUSCULAR | Status: AC
  Filled 2011-09-15: qty 1

## 2011-09-15 MED ORDER — LIDOCAINE HCL (PF) 1 % IJ SOLN
INTRAMUSCULAR | Status: AC
  Filled 2011-09-15: qty 30

## 2011-09-15 MED ORDER — MORPHINE SULFATE 2 MG/ML IJ SOLN: 1.0000 mg | INTRAMUSCULAR | Status: DC | PRN

## 2011-09-15 MED ORDER — SODIUM CHLORIDE 0.9 % IJ SOLN: 3.0000 mL | INTRAMUSCULAR | Status: DC | PRN

## 2011-09-15 MED ORDER — HEPARIN (PORCINE) IN NACL 2-0.9 UNIT/ML-% IJ SOLN
INTRAMUSCULAR | Status: AC
  Filled 2011-09-15: qty 2000

## 2011-09-15 MED ORDER — SODIUM CHLORIDE 0.9 % IJ SOLN: 3.0000 mL | Freq: Two times a day (BID) | INTRAMUSCULAR | Status: DC

## 2011-09-15 MED ORDER — SODIUM CHLORIDE 0.9 % IV SOLN: 250.0000 mL | INTRAVENOUS | Status: DC | PRN

## 2011-09-15 MED ORDER — OXYCODONE-ACETAMINOPHEN 5-325 MG PO TABS
1.0000 | ORAL_TABLET | ORAL | Status: DC | PRN
  Administered 2011-09-15 – 2011-09-16 (×4): 1 via ORAL
  Filled 2011-09-15 (×4): qty 1

## 2011-09-15 MED ORDER — LOSARTAN POTASSIUM 25 MG PO TABS
25.0000 mg | ORAL_TABLET | Freq: Every day | ORAL | Status: DC
  Administered 2011-09-15 – 2011-09-16 (×2): 25 mg via ORAL
  Filled 2011-09-15 (×2): qty 1

## 2011-09-15 MED ORDER — ONDANSETRON HCL 4 MG/2ML IJ SOLN
4.0000 mg | Freq: Four times a day (QID) | INTRAMUSCULAR | Status: DC | PRN
  Administered 2011-09-16: 4 mg via INTRAVENOUS
  Filled 2011-09-15: qty 2

## 2011-09-15 MED ORDER — NITROGLYCERIN 0.2 MG/ML ON CALL CATH LAB
INTRAVENOUS | Status: AC
  Filled 2011-09-15: qty 1

## 2011-09-15 MED ORDER — ONDANSETRON HCL 4 MG/2ML IJ SOLN: 4.0000 mg | Freq: Four times a day (QID) | INTRAMUSCULAR | Status: DC | PRN

## 2011-09-15 MED ORDER — SODIUM CHLORIDE 0.9 % IV SOLN: 1.0000 mL/kg/h | INTRAVENOUS | Status: AC

## 2011-09-15 MED ORDER — AMLODIPINE BESYLATE 10 MG PO TABS
10.0000 mg | ORAL_TABLET | Freq: Every day | ORAL | Status: DC
  Administered 2011-09-15 – 2011-09-16 (×2): 10 mg via ORAL
  Filled 2011-09-15 (×2): qty 1

## 2011-09-15 MED ORDER — SIMVASTATIN 5 MG PO TABS
5.0000 mg | ORAL_TABLET | Freq: Every day | ORAL | Status: DC
  Administered 2011-09-15: 5 mg via ORAL
  Filled 2011-09-15 (×2): qty 1

## 2011-09-15 MED ORDER — DIAZEPAM 5 MG PO TABS
5.0000 mg | ORAL_TABLET | ORAL | Status: AC
  Administered 2011-09-15: 5 mg via ORAL
  Filled 2011-09-15: qty 1

## 2011-09-15 MED ORDER — MIDAZOLAM HCL 2 MG/2ML IJ SOLN
INTRAMUSCULAR | Status: AC
  Filled 2011-09-15: qty 2

## 2011-09-15 MED ORDER — FENTANYL CITRATE 0.05 MG/ML IJ SOLN
INTRAMUSCULAR | Status: AC
  Filled 2011-09-15: qty 2

## 2011-09-15 MED ORDER — TEMAZEPAM 15 MG PO CAPS
15.0000 mg | ORAL_CAPSULE | Freq: Every evening | ORAL | Status: DC | PRN
  Administered 2011-09-15: 15 mg via ORAL
  Filled 2011-09-15: qty 1

## 2011-09-15 MED ORDER — SODIUM CHLORIDE 0.9 % IV SOLN
INTRAVENOUS | Status: DC
  Administered 2011-09-15: 08:00:00 via INTRAVENOUS

## 2011-09-15 MED ORDER — SODIUM CHLORIDE 0.9 % IV SOLN: 1.0000 mL/kg/h | INTRAVENOUS | Status: DC

## 2011-09-15 NOTE — H&P (Signed)
  Date of Initial H&P: 09/10/11 History reviewed, patient examined, no change in status, stable for cath.  Patient with persistent chest pain.

## 2011-09-15 NOTE — Op Note (Signed)
PROCEDURE:  Left heart catheterization with selective coronary angiography, left ventriculogram.  INDICATIONS:    The risks, benefits, and details of the procedure were explained to the patient.  The patient verbalized understanding and wanted to proceed.  Informed written consent was obtained.  PROCEDURE TECHNIQUE:  After Xylocaine anesthesia a 29F sheath was placed in the right femoral artery with a single anterior needle wall stick.   Left coronary angiography was done using a Judkins L4 guide catheter.  Right coronary angiography was done using a Judkins R4 guide catheter.  Left ventriculography was done using a pigtail catheter.  The catheter was withdrawn to the abdominal aorta a power injection of contrast was done in the AP projection to image the abdominal aorta.   CONTRAST:  Total of 90 cc.  COMPLICATIONS:  None.    HEMODYNAMICS:  Aortic pressure was 156/87, mean aortic pressure 116; LV pressure was 154/8; LVEDP 11.  There was no gradient between the left ventricle and aorta.    ANGIOGRAPHIC DATA:   The left main coronary artery is a large vessel and widely patent..  The left anterior descending artery is a large vessel which wraps around the apex.  There are 2 medium-sized diagonals both of which are widely patent.  The LAD is also quite tortuous. .  The left circumflex artery is a large, codominant vessel which is quite tortuous.  There is a medium-sized OM 1 which appears patent.  There is a medium-sized OM 2 which is widely patent.  There is a large OM 3 which is widely patent.  The right coronary artery is large, dominant vessel which is widely patent.  The posterior lateral artery is very small.  The posterior descending artery is a large artery which is widely patent.  LEFT VENTRICULOGRAM:  Left ventricular angiogram was done in the 30 RAO projection and revealed normal left ventricular wall motion and systolic function with an estimated ejection fraction of 60 %.  LVEDP was  11 mmHg.  ABDOMINAL AORTOGRAM: No abdominal aortic aneurysm.  Single, bilateral renal arteries which appear widely patent.  The aortoiliac bifurcation appears widely patent.  IMPRESSIONS:  1. Normal left main coronary artery. 2. Normal left anterior descending artery and its branches. 3. Normal left circumflex artery and its branches. 4. Normal right coronary artery. 5. Normal left ventricular systolic function.  LVEDP 11 mmHg.  Ejection fraction 60 %. 6.  Tortuous coronary arteries. 7.  No abdominal aortic aneurysm or renal artery stenosis.  RECOMMENDATION:  She needs to stop smoking and continue with aggressive risk factor modification.  She will be discharged later today.

## 2011-09-16 LAB — CBC
HCT: 32.8 % — ABNORMAL LOW (ref 36.0–46.0)
Hemoglobin: 10.7 g/dL — ABNORMAL LOW (ref 12.0–15.0)
MCV: 82.4 fL (ref 78.0–100.0)
RBC: 3.98 MIL/uL (ref 3.87–5.11)
WBC: 4.3 10*3/uL (ref 4.0–10.5)

## 2011-09-16 LAB — BASIC METABOLIC PANEL
BUN: 14 mg/dL (ref 6–23)
CO2: 25 mEq/L (ref 19–32)
Chloride: 108 mEq/L (ref 96–112)
Creatinine, Ser: 0.88 mg/dL (ref 0.50–1.10)
Glucose, Bld: 120 mg/dL — ABNORMAL HIGH (ref 70–99)

## 2011-09-16 NOTE — Progress Notes (Signed)
Clinical Social Worker assisted patient with receiving bus passes needed to return home.   Leandro Reasoner MSW, [place]

## 2011-09-16 NOTE — Progress Notes (Signed)
Pt states she smokes 1/4 ppd and has made a decision to quit on [birth year] next [birth year]. Pt in action stage and eager to quit. Congratulated pt on her decision to quit. Referred to 1-800 quit now for f/u and support. Discussed oral fixation substitutes, second hand smoke and in home smoking policy. Reviewed and gave pt Written education/contact information.

## 2011-09-16 NOTE — Discharge Summary (Addendum)
Patient ID: Audrey Hill MRN: [id number] DOB/AGE: 05-17-1956 55 y.o.  Admit date: 09/15/2011 Discharge date: 09/16/2011  Primary Discharge Diagnosis chest pain, Secondary Discharge Diagnosis  hypertension, obesity, chronic pain  Significant Diagnostic Studies: angiography: Cardiac cath showing no significant coronary artery disease: 09/15/11.  Normal left ventricular function  Consults: none  Hospital Course: Patient underwent diagnostic cardiac catheterization.  There were no significant abnormalities found.  The procedure was uncomplicated.  She stated that she could not get a ride home due to her daughter's car breaking down.  We obtained a cab voucher for her.  She then stated she had no one that could stay over with her.  She would not state and anyone else's house.  She wanted to spend one night in the hospital.  While in the hospital, she had persistent knee pain.  She took Percocet and also received morphine for pain.  She has been on Percocet at home.  However Tylenol is listed as an allergy for her.  Due to the fact that she was tolerating a Percocet at home, we continued Percocet in the hospital.  The following day, her groin was stable.  Lab results were stable.  She requested additional pain medicines but was willing to go home.  She stated that she did not have any more pain medicines at home.  Blood pressure was stable while she was in the hospital.  A prescription for 20 Percocet (7.5/500) tablets was given to the patient.   Discharge Exam: Blood pressure 117/72, pulse 57, temperature 98.5 F (36.9 C), temperature source Oral, resp. rate 20, height 5\' 6"  (1.676 m), weight 104.327 kg (230 lb), SpO2 98.00%.   RRR CTA bilaterally Soft, NT, ND, obese No right groin hematoma 3+ right posterior tibial pulse Labs:   Lab Results  Component Value Date   WBC 4.3 09/16/2011   HGB 10.7* 09/16/2011   HCT 32.8* 09/16/2011   MCV 82.4 09/16/2011   PLT 198 09/16/2011    Lab  09/16/11 0547  NA 140  K 3.5  CL 108  CO2 25  BUN 14  CREATININE 0.88  CALCIUM 8.5  PROT --  BILITOT --  ALKPHOS --  ALT --  AST --  GLUCOSE 120*   Lab Results  Component Value Date   CKTOTAL 163 08/24/2011   CKMB 2.3 08/24/2011   TROPONINI <0.30 08/24/2011    Lab Results  Component Value Date   CHOL 170 08/24/2011   CHOL  Value: 105        ATP III CLASSIFICATION:  <200     mg/dL   Desirable  200-239  mg/dL   Borderline High  >=240    mg/dL   High        05/05/2009   Lab Results  Component Value Date   HDL 56 08/24/2011   HDL 49 05/05/2009   Lab Results  Component Value Date   LDLCALC 78 08/24/2011   LDLCALC  Value: 43        Total Cholesterol/HDL:CHD Risk Coronary Heart Disease Risk Table                     Men   Women  1/2 Average Risk   3.4   3.3  Average Risk       5.0   4.4  2 X Average Risk   9.6   7.1  3 X Average Risk  23.4   11.0        Use the calculated  Patient Ratio above and the CHD Risk Table to determine the patient's CHD Risk.        ATP III CLASSIFICATION (LDL):  <100     mg/dL   Optimal  100-129  mg/dL   Near or Above                    Optimal  130-159  mg/dL   Borderline  160-189  mg/dL   High  >190     mg/dL   Very High 05/05/2009   Lab Results  Component Value Date   TRIG 182* 08/24/2011   TRIG 63 05/05/2009   Lab Results  Component Value Date   CHOLHDL 3.0 08/24/2011   CHOLHDL 2.1 05/05/2009   No results found for this basename: LDLDIRECT       FOLLOW UP PLANS AND APPOINTMENTS  Current Discharge Medication List    CONTINUE these medications which have NOT CHANGED   Details  amLODipine (NORVASC) 10 MG tablet Take 10 mg by mouth daily.      furosemide (LASIX) 20 MG tablet Take 20 mg by mouth daily.      losartan (COZAAR) 25 MG tablet Take 25 mg by mouth daily.      omeprazole (PRILOSEC) 20 MG capsule Take 20 mg by mouth daily.      pravastatin (PRAVACHOL) 40 MG tablet Take 40 mg by mouth daily.      oxyCODONE-acetaminophen (PERCOCET)  7.5-500 MG per tablet Take 1 tablet by mouth every 4 (four) hours as needed.      temazepam (RESTORIL) 15 MG capsule Take 15 mg by mouth at bedtime as needed. For sleep        Follow-up Information    Follow up with Audrey Hill S. in 2 weeks. (groin check with Audrey Hill)    Contact information:   [address] [place] [phone]          BRING ALL MEDICATIONS WITH YOU TO FOLLOW UP APPOINTMENTS  Time spent with patient to include physician time: 25 minutes Signed: Maliyah Hill S. 09/16/2011, 8:36 AM

## 2011-09-16 NOTE — Progress Notes (Signed)
   CARE MANAGEMENT NOTE 09/16/2011  Patient:  ELIABETH, PLAUTZ   Account Number:  [id number]  Date Initiated:  09/16/2011  Documentation initiated by:  GRAVES-BIGELOW,Militza Devery  Subjective/Objective Assessment:   Pt admitted  with angina. Planned for d/c today.     Action/Plan:   Pt needed bus pass for d/c and SW provided that for pt.   Anticipated DC Date:  09/16/2011   Anticipated DC Plan:  HOME/SELF CARE  In-house referral  Clinical Social Worker      DC Planning Services  CM consult      Choice offered to / List presented to:             Status of service:  Completed, signed off Medicare Important Message given?   (If response is "NO", the following Medicare IM given date fields will be blank) Date Medicare IM given:   Date Additional Medicare IM given:    Discharge Disposition:  HOME/SELF CARE  Per UR Regulation:    Comments:

## 2011-10-14 ENCOUNTER — Emergency Department (HOSPITAL_COMMUNITY)
Admission: EM | Admit: 2011-10-14 | Discharge: 2011-10-14 | Disposition: A | Discharge status: Home / Self Care | Payer: Medicare Other | Attending: Emergency Medicine | Admitting: Emergency Medicine

## 2011-10-14 ENCOUNTER — Emergency Department (HOSPITAL_COMMUNITY): Payer: Medicare Other

## 2011-10-14 ENCOUNTER — Other Ambulatory Visit: Payer: Self-pay

## 2011-10-14 ENCOUNTER — Encounter (HOSPITAL_COMMUNITY): Payer: Self-pay

## 2011-10-14 DIAGNOSIS — R079 Chest pain, unspecified: Secondary | ICD-10-CM | POA: Insufficient documentation

## 2011-10-14 DIAGNOSIS — R0602 Shortness of breath: Secondary | ICD-10-CM | POA: Insufficient documentation

## 2011-10-14 DIAGNOSIS — K219 Gastro-esophageal reflux disease without esophagitis: Secondary | ICD-10-CM | POA: Insufficient documentation

## 2011-10-14 DIAGNOSIS — R05 Cough: Secondary | ICD-10-CM | POA: Insufficient documentation

## 2011-10-14 DIAGNOSIS — R110 Nausea: Secondary | ICD-10-CM | POA: Insufficient documentation

## 2011-10-14 DIAGNOSIS — J069 Acute upper respiratory infection, unspecified: Secondary | ICD-10-CM

## 2011-10-14 DIAGNOSIS — I10 Essential (primary) hypertension: Secondary | ICD-10-CM | POA: Insufficient documentation

## 2011-10-14 DIAGNOSIS — G40909 Epilepsy, unspecified, not intractable, without status epilepticus: Secondary | ICD-10-CM | POA: Insufficient documentation

## 2011-10-14 DIAGNOSIS — J45909 Unspecified asthma, uncomplicated: Secondary | ICD-10-CM | POA: Insufficient documentation

## 2011-10-14 DIAGNOSIS — E785 Hyperlipidemia, unspecified: Secondary | ICD-10-CM | POA: Insufficient documentation

## 2011-10-14 DIAGNOSIS — Z79899 Other long term (current) drug therapy: Secondary | ICD-10-CM | POA: Insufficient documentation

## 2011-10-14 DIAGNOSIS — R059 Cough, unspecified: Secondary | ICD-10-CM | POA: Insufficient documentation

## 2011-10-14 DIAGNOSIS — M1990 Unspecified osteoarthritis, unspecified site: Secondary | ICD-10-CM | POA: Insufficient documentation

## 2011-10-14 LAB — DIFFERENTIAL
Basophils Absolute: 0 10*3/uL (ref 0.0–0.1)
Basophils Relative: 0 % (ref 0–1)
Eosinophils Relative: 0 % (ref 0–5)
Lymphocytes Relative: 13 % (ref 12–46)
Monocytes Absolute: 0.4 10*3/uL (ref 0.1–1.0)
Monocytes Relative: 6 % (ref 3–12)

## 2011-10-14 LAB — COMPREHENSIVE METABOLIC PANEL
AST: 25 U/L (ref 0–37)
BUN: 14 mg/dL (ref 6–23)
CO2: 30 mEq/L (ref 19–32)
Calcium: 9.1 mg/dL (ref 8.4–10.5)
Creatinine, Ser: 0.94 mg/dL (ref 0.50–1.10)
GFR calc non Af Amer: 66 mL/min — ABNORMAL LOW (ref >90)

## 2011-10-14 LAB — CBC
HCT: 36.6 % (ref 36.0–46.0)
MCHC: 33.9 g/dL (ref 30.0–36.0)
MCV: 80.8 fL (ref 78.0–100.0)
RDW: 14.6 % (ref 11.5–15.5)

## 2011-10-14 LAB — TROPONIN I: Troponin I: <0.3 ng/mL (ref <0.30)

## 2011-10-14 MED ORDER — IPRATROPIUM BROMIDE 0.02 % IN SOLN
RESPIRATORY_TRACT | Status: AC
  Administered 2011-10-14: 0.5 mg via RESPIRATORY_TRACT
  Filled 2011-10-14: qty 2.5

## 2011-10-14 MED ORDER — PROMETHAZINE-DM 6.25-15 MG/5ML PO SYRP: 5.0000 mL | ORAL_SOLUTION | Freq: Four times a day (QID) | ORAL | Status: AC | PRN

## 2011-10-14 MED ORDER — ALBUTEROL SULFATE (5 MG/ML) 0.5% IN NEBU
INHALATION_SOLUTION | RESPIRATORY_TRACT | Status: AC
  Administered 2011-10-14: 2.5 mg via RESPIRATORY_TRACT
  Filled 2011-10-14: qty 1

## 2011-10-14 MED ORDER — ALBUTEROL SULFATE HFA 108 (90 BASE) MCG/ACT IN AERS
2.0000 | INHALATION_SPRAY | RESPIRATORY_TRACT | Status: DC | PRN
  Administered 2011-10-14: 2 via RESPIRATORY_TRACT
  Filled 2011-10-14: qty 6.7

## 2011-10-14 MED ORDER — IPRATROPIUM BROMIDE 0.02 % IN SOLN
0.5000 mg | Freq: Once | RESPIRATORY_TRACT | Status: AC
  Administered 2011-10-14: 0.5 mg via RESPIRATORY_TRACT
  Filled 2011-10-14: qty 2.5

## 2011-10-14 MED ORDER — ALBUTEROL SULFATE (5 MG/ML) 0.5% IN NEBU
5.0000 mg | INHALATION_SOLUTION | Freq: Once | RESPIRATORY_TRACT | Status: AC
  Administered 2011-10-14: 5 mg via RESPIRATORY_TRACT
  Filled 2011-10-14: qty 1

## 2011-10-14 MED ORDER — ALBUTEROL SULFATE (5 MG/ML) 0.5% IN NEBU
5.0000 mg | INHALATION_SOLUTION | Freq: Once | RESPIRATORY_TRACT | Status: AC
  Administered 2011-10-14: 2.5 mg via RESPIRATORY_TRACT
  Filled 2011-10-14: qty 0.5
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
## 2011-10-14 NOTE — ED Notes (Signed)
Pt states that for the past few days she has been having a severe cough, shortness of breath, and chest pain from extensive coughing. Pt has audible wheezing in triage.

## 2011-10-14 NOTE — ED Notes (Signed)
Respiratory notified of pt. SOB and distress.

## 2011-11-23 ENCOUNTER — Encounter: Payer: Self-pay | Admitting: Vascular Surgery

## 2011-11-24 ENCOUNTER — Other Ambulatory Visit (INDEPENDENT_AMBULATORY_CARE_PROVIDER_SITE_OTHER): Payer: Medicare Other | Admitting: Vascular Surgery

## 2011-11-24 ENCOUNTER — Ambulatory Visit (INDEPENDENT_AMBULATORY_CARE_PROVIDER_SITE_OTHER): Payer: Medicare Other | Admitting: Vascular Surgery

## 2011-11-24 ENCOUNTER — Encounter: Payer: Self-pay | Admitting: Vascular Surgery

## 2011-11-24 VITALS — BP 155/96 | HR 85 | Resp 16 | Ht 66.0 in | Wt 241.0 lb

## 2011-11-24 DIAGNOSIS — I6529 Occlusion and stenosis of unspecified carotid artery: Secondary | ICD-10-CM

## 2011-11-24 NOTE — Assessment & Plan Note (Signed)
Carotid duplex scan in our office shows a less than 39% carotid stenosis bilaterally. The slightly higher velocities seen and [place] may have been the external carotid artery and not the internal carotid artery on the left. We did show some moderate left external carotid artery stenosis on the left which is not clinically significant. I do not think her symptoms can be attributed to carotid disease. She's having neck pain and some paresthesias in her arms and could potentially have some cervical disc disease. In addition she's had some migraine headaches which I explained would not be related to carotid disease. I have ordered a follow up carotid duplex scan in 1 year and I'll see her back at that time. I've explained it for headaches persisted her neck pain persists consideration could be given to a neurologic evaluation.
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
## 2011-11-25 NOTE — Procedures (Unsigned)
CAROTID DUPLEX EXAM  INDICATION:  Carotid stenosis.  HISTORY: Diabetes:  No Cardiac:  Heart murmur Hypertension:  Yes Smoking:  Currently Previous Surgery:  No carotid intervention. CV History:  History of TIA, currently experiencing right-sided weakness. Amaurosis Fugax No, Paresthesias Yes, Hemiparesis No                                      RIGHT             LEFT Brachial systolic pressure:         148               148 Brachial Doppler waveforms:         WNL               WNL Vertebral direction of flow:        Antegrade         Antegrade DUPLEX VELOCITIES (cm/sec) CCA peak systolic                   93                [id number] ECA peak systolic                   92                [id number] ICA peak systolic                   58                78 ICA end diastolic                   27                30 PLAQUE MORPHOLOGY:                  Heterogenous      Heterogenous PLAQUE AMOUNT:                      Mild              Mild PLAQUE LOCATION:                    CCA/ICA/ECA       CCA/ICA/ECA  IMPRESSION:  Bilateral internal carotid artery stenosis present in the 1% to 39% range.  Bilateral external carotid arteries are patent.  Bilateral vertebral arteries are patent and antegrade.  ___________________________________________ Judeth Cornfield. Scot Dock, M.D.  SH/MEDQ  D:  11/24/2011  T:  11/24/2011  Job:  [id number]

## 2011-12-13 ENCOUNTER — Emergency Department (HOSPITAL_COMMUNITY): Payer: Medicare Other

## 2011-12-13 ENCOUNTER — Emergency Department (HOSPITAL_COMMUNITY)
Admission: EM | Admit: 2011-12-13 | Discharge: 2011-12-13 | Disposition: A | Discharge status: Home / Self Care | Payer: Medicare Other | Attending: Emergency Medicine | Admitting: Emergency Medicine

## 2011-12-13 ENCOUNTER — Encounter (HOSPITAL_COMMUNITY): Payer: Self-pay

## 2011-12-13 ENCOUNTER — Other Ambulatory Visit: Payer: Self-pay

## 2011-12-13 DIAGNOSIS — M25579 Pain in unspecified ankle and joints of unspecified foot: Secondary | ICD-10-CM | POA: Insufficient documentation

## 2011-12-13 DIAGNOSIS — W19XXXA Unspecified fall, initial encounter: Secondary | ICD-10-CM

## 2011-12-13 DIAGNOSIS — I10 Essential (primary) hypertension: Secondary | ICD-10-CM | POA: Insufficient documentation

## 2011-12-13 DIAGNOSIS — M129 Arthropathy, unspecified: Secondary | ICD-10-CM | POA: Insufficient documentation

## 2011-12-13 DIAGNOSIS — W010XXA Fall on same level from slipping, tripping and stumbling without subsequent striking against object, initial encounter: Secondary | ICD-10-CM | POA: Insufficient documentation

## 2011-12-13 DIAGNOSIS — R110 Nausea: Secondary | ICD-10-CM | POA: Insufficient documentation

## 2011-12-13 DIAGNOSIS — Z79899 Other long term (current) drug therapy: Secondary | ICD-10-CM | POA: Insufficient documentation

## 2011-12-13 DIAGNOSIS — R071 Chest pain on breathing: Secondary | ICD-10-CM | POA: Insufficient documentation

## 2011-12-13 DIAGNOSIS — E785 Hyperlipidemia, unspecified: Secondary | ICD-10-CM | POA: Insufficient documentation

## 2011-12-13 DIAGNOSIS — J45909 Unspecified asthma, uncomplicated: Secondary | ICD-10-CM | POA: Insufficient documentation

## 2011-12-13 DIAGNOSIS — M79609 Pain in unspecified limb: Secondary | ICD-10-CM | POA: Insufficient documentation

## 2011-12-13 DIAGNOSIS — M25569 Pain in unspecified knee: Secondary | ICD-10-CM | POA: Insufficient documentation

## 2011-12-13 DIAGNOSIS — F411 Generalized anxiety disorder: Secondary | ICD-10-CM | POA: Insufficient documentation

## 2011-12-13 DIAGNOSIS — Z8673 Personal history of transient ischemic attack (TIA), and cerebral infarction without residual deficits: Secondary | ICD-10-CM | POA: Insufficient documentation

## 2011-12-13 DIAGNOSIS — M25559 Pain in unspecified hip: Secondary | ICD-10-CM | POA: Insufficient documentation

## 2011-12-13 DIAGNOSIS — R42 Dizziness and giddiness: Secondary | ICD-10-CM | POA: Insufficient documentation

## 2011-12-13 DIAGNOSIS — K219 Gastro-esophageal reflux disease without esophagitis: Secondary | ICD-10-CM | POA: Insufficient documentation

## 2011-12-13 DIAGNOSIS — M542 Cervicalgia: Secondary | ICD-10-CM | POA: Insufficient documentation

## 2011-12-13 LAB — BASIC METABOLIC PANEL
CO2: 26 mEq/L (ref 19–32)
Calcium: 9.6 mg/dL (ref 8.4–10.5)
Creatinine, Ser: 0.88 mg/dL (ref 0.50–1.10)
GFR calc Af Amer: 83 mL/min — ABNORMAL LOW (ref >90)
Sodium: 143 mEq/L (ref 135–145)

## 2011-12-13 LAB — DIFFERENTIAL
Basophils Absolute: 0 10*3/uL (ref 0.0–0.1)
Basophils Relative: 0 % (ref 0–1)
Eosinophils Relative: 3 % (ref 0–5)
Lymphocytes Relative: 32 % (ref 12–46)
Monocytes Absolute: 0.2 10*3/uL (ref 0.1–1.0)
Neutro Abs: 2.7 10*3/uL (ref 1.7–7.7)

## 2011-12-13 LAB — POCT I-STAT TROPONIN I: Troponin i, poc: 0 ng/mL (ref 0.00–0.08)

## 2011-12-13 LAB — CBC
MCHC: 34.4 g/dL (ref 30.0–36.0)
MCV: 79.7 fL (ref 78.0–100.0)
Platelets: 188 10*3/uL (ref 150–400)
RDW: 14.7 % (ref 11.5–15.5)
WBC: 4.5 10*3/uL (ref 4.0–10.5)

## 2011-12-13 MED ORDER — ASPIRIN 81 MG PO CHEW
324.0000 mg | CHEWABLE_TABLET | Freq: Once | ORAL | Status: DC
  Filled 2011-12-13: qty 4

## 2011-12-13 MED ORDER — HYDROCODONE-ACETAMINOPHEN 5-325 MG PO TABS
1.0000 | ORAL_TABLET | Freq: Once | ORAL | Status: AC
  Administered 2011-12-13: 1 via ORAL
  Filled 2011-12-13: qty 1

## 2011-12-13 MED ORDER — MORPHINE SULFATE 4 MG/ML IJ SOLN
4.0000 mg | Freq: Once | INTRAMUSCULAR | Status: AC
  Administered 2011-12-13: 4 mg via INTRAVENOUS
  Filled 2011-12-13: qty 1

## 2011-12-13 NOTE — ED Notes (Signed)
Patient remains on monitor and resting with NAD at this time.

## 2011-12-13 NOTE — ED Notes (Signed)
EDP at bedside  
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
## 2011-12-13 NOTE — ED Notes (Signed)
Patient transported to x-ray. ?

## 2011-12-13 NOTE — ED Notes (Signed)
Patient remains on monitor and denies cheat pain.

## 2011-12-13 NOTE — Discharge Instructions (Signed)
Read instructions below for reasons to return to the Emergency Department. It is recommended that your follow up with your Primary Care Doctor in regards to today's visit. If you do not have a doctor, use the resource guide listed below to help you find one. Begin taking over the counter Prilosec or Zegrid as directed.   Chest Pain (Nonspecific)  HOME CARE INSTRUCTIONS  For the next few days, avoid physical activities that bring on chest pain. Continue physical activities as directed.  Do not smoke cigarettes or drink alcohol until your symptoms are gone.  Only take over-the-counter or prescription medicine for pain, discomfort, or fever as directed by your caregiver.  Follow your caregiver's suggestions for further testing if your chest pain does not go away.  Keep any follow-up appointments you made. If you do not go to an appointment, you could develop lasting (chronic) problems with pain. If there is any problem keeping an appointment, you must call to reschedule.  SEEK MEDICAL CARE IF:  You think you are having problems from the medicine you are taking. Read your medicine instructions carefully.  Your chest pain does not go away, even after treatment.  You develop a rash with blisters on your chest.  SEEK IMMEDIATE MEDICAL CARE IF:  You have increased chest pain or pain that spreads to your arm, neck, jaw, back, or belly (abdomen).  You develop shortness of breath, an increasing cough, or you are coughing up blood.  You have severe back or abdominal pain, feel sick to your stomach (nauseous) or throw up (vomit).  You develop severe weakness, fainting, or chills.  You have an oral temperature above 102 F (38.9 C), not controlled by medicine.   THIS IS AN EMERGENCY. Do not wait to see if the pain will go away. Get medical help at once. Call your local emergency services (911 in [place] Do not drive yourself to the hospital.   RESOURCE GUIDE  Dental Problems  Patients with  Medicaid: [facility]                     [facility] [address]                                           [address] Phone:  [phone]                                                  Phone:  [phone]  If unable to pay or uninsured, contact:  [facility] or [facility] to become qualified for the adult dental clinic.  Chronic Pain Problems Contact [facility]  [phone] Patients need to be referred by their primary care doctor.  Insufficient Money for Medicine Contact [employer]:  call "211" or [facility] 271-5999.  No Primary Care Doctor Call [facility]  832-8000 Other agencies that provide inexpensive medical care    [facility]  832-8035    [facility]  832-7272    [facility]  [phone]    Women's Clinic  832-4777    [facility]  [phone]    [facility]  [phone]  Psychological Services   [facility]  [phone] [facility]  [phone] [facility]   800 853-5163 (emergency services 641-4993)  Substance Abuse Resources [facility]  336-882-2125 [facility] 336-784-9470 [facility] 336-285-9073 [facility] 336-845-3988 Residential & Outpatient Substance Abuse Program  800-659-3381  Abuse/Neglect [place] Child Abuse Hotline (336) 641-3795 [place] Child Abuse Hotline 800-378-5315 (After Hours)  Emergency Shelter [facility] (336) 271-5985  [facility] (336) 275-9566 [facility] (704) 372-4663  MRSA Hotline #:   832-7006    [place] Resources  [facility] [place]     [employer]                          [facility] [address] [place]                       [address]      [address]  [place]                                                 [place]                            [place] Phone:  [phone]                                   Phone:  [phone]                 Phone:  [phone]  [facility] Phone:  [phone]  [place] Child Abuse Hotline (336) 342-1394 (336) 342-3537 (After Hours)   
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
## 2011-12-19 NOTE — ED Provider Notes (Signed)
Medical screening examination/treatment/procedure(s) were conducted as a shared visit with non-physician practitioner(s) and myself.  I personally evaluated the patient during the encounter.  56 year old female with chest pain. Onset of pain was after a fall. Consider ACS but doubt. Patient recently had a cardiac catheterization this past December. Workup today pre-unremarkable. The patient is appropriate for outpatient followup. Return precautions were discussed. Doubt infectious. Doubt pulmonary embolism.  Virgel Manifold, MD 12/19/11 1143

## 2012-08-24 ENCOUNTER — Institutional Professional Consult (permissible substitution): Payer: Medicare Other | Admitting: Internal Medicine

## 2012-08-29 ENCOUNTER — Institutional Professional Consult (permissible substitution): Payer: Medicare Other | Admitting: Internal Medicine

## 2012-08-30 ENCOUNTER — Ambulatory Visit (INDEPENDENT_AMBULATORY_CARE_PROVIDER_SITE_OTHER): Payer: Medicare Other | Admitting: Internal Medicine

## 2012-08-30 ENCOUNTER — Encounter: Payer: Self-pay | Admitting: Internal Medicine

## 2012-08-30 VITALS — BP 140/90 | HR 95 | Temp 98.1°F | Ht 66.0 in | Wt 248.2 lb

## 2012-08-30 DIAGNOSIS — F17200 Nicotine dependence, unspecified, uncomplicated: Secondary | ICD-10-CM

## 2012-08-30 DIAGNOSIS — R059 Cough, unspecified: Secondary | ICD-10-CM

## 2012-08-30 DIAGNOSIS — R05 Cough: Secondary | ICD-10-CM

## 2012-08-30 MED ORDER — OXYCODONE-ACETAMINOPHEN 5-325 MG PO TABS: 1.0000 | ORAL_TABLET | ORAL | Status: DC | PRN

## 2012-08-30 MED ORDER — OMEPRAZOLE 20 MG PO CPDR: DELAYED_RELEASE_CAPSULE | ORAL | Status: DC

## 2012-08-30 MED ORDER — BUDESONIDE-FORMOTEROL FUMARATE 80-4.5 MCG/ACT IN AERO: 2.0000 | INHALATION_SPRAY | Freq: Two times a day (BID) | RESPIRATORY_TRACT | Status: DC

## 2012-08-30 NOTE — Assessment & Plan Note (Signed)
>   3 min discussion  I emphasized that although we never turn away smokers from the pulmonary clinic, we do ask that they understand that the recommendations that we make  won't work nearly as well in the presence of continued cigarette exposure.  In fact, we may very well  reach a point where we can't promise to help the patient if he/she can't quit smoking. (We can and will promise to try to help, we just can't promise what we recommend will really work - especially if she can't afford the medicines, which she says is a concern, but can still afford the cigarettes)

## 2012-08-30 NOTE — Patient Instructions (Addendum)
Stop advair and only symbicort 80 2 puffs every 12 hours if needed for breathing  Work on inhaler technique:  relax and gently blow all the way out then take a nice smooth deep breath back in, triggering the inhaler at same time you start breathing in.  Hold for up to 5 seconds if you can.  Rinse and gargle with water when done   If your mouth or throat starts to bother you,   I suggest you time the inhaler to your dental care and after using the inhaler(s) brush teeth and tongue with a baking soda containing toothpaste and when you rinse this out, gargle with it first to see if this helps your mouth and throat.     Try prilosec 20mg   Take 30-60 min before first meal of the day and Pepcid 20 mg one bedtime until return  GERD (REFLUX)  is an extremely common cause of respiratory symptoms, many times with no significant heartburn at all.    It can be treated with medication, but also with lifestyle changes including avoidance of late meals, excessive alcohol, smoking cessation, and avoid fatty foods, chocolate, peppermint, colas, red wine, and acidic juices such as orange juice.  NO MINT OR MENTHOL PRODUCTS SO NO COUGH DROPS  USE SUGARLESS CANDY INSTEAD (jolley ranchers or Stover's)  NO OIL BASED VITAMINS - use powdered substitutes.    Take delsym two tsp every 12 hours and supplement if needed with  roxicet one every  4 hours to suppress the urge to cough. Swallowing water or using ice chips/non mint and menthol containing candies (such as lifesavers or sugarless jolly ranchers) are also effective.  You should rest your voice and avoid activities that you know make you cough.  Once you have eliminated the cough for 3 straight days try reducing the roxicet first,  then the delsym as tolerated.    Please schedule a follow up office visit in 3 weeks, sooner if needed.

## 2012-08-30 NOTE — Progress Notes (Signed)
  Subjective:    Patient ID: Audrey Hill, female    DOB: 05/13/1956  MRN: [id number]  HPI  72 yobf smoker with new onset persistent cough starting winter of 2013 referred 08/30/2012 by Dr Kennon Holter to pulmonary clinic.   08/30/2012 1st pulmonary eval cc indolent onset persistent cough x a year comes and goes in terms of severity much worse x 3 weeks then breathing got worseand  not really better even p prednisone.  Worse when lie down at night, wakes up coughing > min green mucus, occ blood tinged > to ER at [facility] with neg CT chest first of November. Has gen bilateral ant cp ontly with coughing.- on advair but not helping either, best is hydromet but can't afford it.  Only really sob when coughing.  No assoc obvious HB or unusual exposure hx  Sleeping ok without nocturnal  or early am exacerbation  of respiratory  c/o's or need for noct saba. Also denies any obvious fluctuation of symptoms with weather or environmental changes or other aggravating or alleviating factors except as outlined above   Review of Systems  Constitutional: Negative for fever, chills and unexpected weight change.  HENT: Positive for congestion, sore throat, sneezing and dental problem. Negative for ear pain, nosebleeds, rhinorrhea, trouble swallowing, voice change, postnasal drip and sinus pressure.   Eyes: Negative for visual disturbance.  Respiratory: Positive for cough and shortness of breath. Negative for choking.   Cardiovascular: Negative for chest pain and leg swelling.  Gastrointestinal: Negative for vomiting, abdominal pain and diarrhea.  Genitourinary: Negative for difficulty urinating.  Musculoskeletal: Positive for arthralgias.  Skin: Negative for rash.  Neurological: Negative for tremors, syncope and headaches.  Hematological: Does not bruise/bleed easily.       Objective:   Physical Exam  Amb obese hoarse bf nad Wt Readings from Last 3 Encounters:  08/30/12 248 lb 3.2 oz (112.583 kg)  11/24/11  241 lb (109.317 kg)  09/15/11 230 lb (104.327 kg)     Gen: No acute distress. Well nourished and well groomed.  Neurological: Alert and oriented to person, place, and time. Coordination normal.  Head: Normocephalic and atraumatic.  Eyes: Conjunctivae are normal. Pupils are equal, round, and reactive to light. No scleral icterus.  Neck: Normal range of motion. Neck supple. No tracheal deviation or thyromegaly present. No cervical lymphadenopathy.  Cardiovascular: Normal rate, regular rhythm, normal heart sounds and intact distal pulses. Exam reveals no gallop and no friction rub. No murmur heard.  Respiratory: Effort normal. No respiratory distress. No chest wall tenderness. Breath sounds normal. No wheezes, rales or rhonchi.  GI: Soft. Bowel sounds are normal. The abdomen is soft and nontender. There is no rebound and no guarding.  Musculoskeletal: Normal range of motion. Extremities are nontender.  Skin: Skin is warm and dry. No rash noted. No diaphoresis. No erythema. No pallor. No clubbing, cyanosis, or edema.  Psychiatric: Normal mood and affect. Behavior is normal. Judgment and thought content normal.    CTa [facility] Nov 2013 reported nl > requested copy 08/30/2012       Assessment & Plan:

## 2012-08-30 NOTE — Assessment & Plan Note (Addendum)
The most common causes of chronic cough in immunocompetent adults include the following: upper airway cough syndrome (UACS), previously referred to as postnasal drip syndrome (PNDS), which is caused by variety of rhinosinus conditions; (2) asthma; (3) GERD; (4) chronic bronchitis from cigarette smoking or other inhaled environmental irritants; (5) nonasthmatic eosinophilic bronchitis; and (6) bronchiectasis.   These conditions, singly or in combination, have accounted for up to 94% of the causes of chronic cough in prospective studies.   Other conditions have constituted no >6% of the causes in prospective studies These have included bronchogenic carcinoma, chronic interstitial pneumonia, sarcoidosis, left ventricular failure, ACEI-induced cough, and aspiration from a condition associated with pharyngeal dysfunction. Chronic cough is often simultaneously caused by more than one condition. A single cause has been found from 38 to 82% of the time, multiple causes from 18 to 62%. Multiply caused cough has been the result of three diseases up to 42% of the time.   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   These are the same pts (now being labeled as having "irritable larynx syndrome" by some cough centers) who not infrequently have a history of having failed to tolerate ace inhibitors,  dry powder inhalers or biphosphonates or report having atypical reflux symptoms that don't respond to standard doses of PPI , and are easily confused as having aecopd or asthma  flares by even experienced allergists/ pulmonologists.   For now max rx for gerd/cyclical cough then regroup  In case she does have an asthmatic component with cough aggravated by dpi try symbicort 80 2 q12h prn   The proper method of use, as well as anticipated side effects, of a metered-dose inhaler are discussed and demonstrated to the patient. Improved effectiveness after extensive coaching during this visit to a level of approximately  75%

## 2012-09-19 ENCOUNTER — Encounter: Payer: Self-pay | Admitting: Internal Medicine

## 2012-09-19 ENCOUNTER — Ambulatory Visit (INDEPENDENT_AMBULATORY_CARE_PROVIDER_SITE_OTHER): Payer: Medicare Other | Admitting: Internal Medicine

## 2012-09-19 VITALS — BP 140/98 | HR 107 | Temp 98.1°F | Ht 66.0 in | Wt 247.6 lb

## 2012-09-19 DIAGNOSIS — R059 Cough, unspecified: Secondary | ICD-10-CM

## 2012-09-19 DIAGNOSIS — J45909 Unspecified asthma, uncomplicated: Secondary | ICD-10-CM

## 2012-09-19 DIAGNOSIS — R05 Cough: Secondary | ICD-10-CM

## 2012-09-19 MED ORDER — MEPERIDINE HCL 50 MG PO TABS: 50.0000 mg | ORAL_TABLET | ORAL | Status: DC | PRN

## 2012-09-19 NOTE — Progress Notes (Signed)
Subjective:    Patient ID: Audrey Hill, female    DOB: 06-01-1956  MRN: [id number]  HPI  83 yobf smoker with new onset persistent cough starting winter of 2013 referred 08/30/2012 by Dr Kennon Holter to pulmonary clinic.   08/30/2012 1st pulmonary eval cc indolent onset persistent cough x a year comes and goes in terms of severity much worse x 3 weeks then breathing got worse and  not really better even p prednisone.  Worse when lie down at night, wakes up coughing > min green mucus, occ blood tinged > to ER at [facility] with neg CT chest first of November. Has gen bilateral ant cp only with coughing.- on advair but not helping either, best is hydromet but can't afford it.  Only really sob when coughing.   rec Stop advair and only symbicort 80 2 puffs every 12 hours if needed for breathing Work on inhaler technique .    Try prilosec 20mg   Take 30-60 min before first meal of the day and Pepcid 20 mg one bedtime until return GERD  Diet reviewed  Take delsym two tsp every 12 hours and supplement if needed with  roxicet one every  4 hours to suppress the urge to cough.    09/19/2012 f/u ov/Jaquelyne Firkus cc confused with inhalers, no change cough, not sure she followed any of the above instructions, requesting narcotics for cough. No purulent sections, cough worse at hs   Sleeping ok without  early am exacerbation  of respiratory  c/o's or need for noct saba. Also denies any obvious fluctuation of symptoms with weather or environmental changes or other aggravating or alleviating factors except as outlined above   ROS  The following are not active complaints unless bolded sore throat, dysphagia, dental problems, itching, sneezing,  nasal congestion or excess/ purulent secretions, ear ache,   fever, chills, sweats, unintended wt loss, pleuritic or exertional cp, hemoptysis,  orthopnea pnd or leg swelling, presyncope, palpitations, heartburn, abdominal pain, anorexia, nausea, vomiting, diarrhea  or change in bowel or  urinary habits, change in stools or urine, dysuria,hematuria,  rash, arthralgias, visual complaints, headache, numbness weakness or ataxia or problems with walking or coordination,  change in mood/affect or memory.            Objective:   Physical Exam  Amb obese min hoarse bf nad 09/19/2012 247 Wt Readings from Last 3 Encounters:  08/30/12 248 lb 3.2 oz (112.583 kg)  11/24/11 241 lb (109.317 kg)  09/15/11 230 lb (104.327 kg)     Gen: No acute distress. Well nourished and well groomed.  Neurological: Alert and oriented to person, place, and time. Coordination normal.  Head: Normocephalic and atraumatic.  Eyes: Conjunctivae are normal. Pupils are equal, round, and reactive to light. No scleral icterus.  Neck: Normal range of motion. Neck supple. No tracheal deviation or thyromegaly present. No cervical lymphadenopathy.  Cardiovascular: Normal rate, regular rhythm, normal heart sounds and intact distal pulses. Exam reveals no gallop and no friction rub. No murmur heard.  Respiratory: Effort normal. No respiratory distress. No chest wall tenderness. Breath sounds normal. No wheezes, rales or rhonchi.  GI: Soft. Bowel sounds are normal. The abdomen is soft and nontender. There is no rebound and no guarding.  Musculoskeletal: Normal range of motion. Extremities are nontender.  Skin: Skin is warm and dry. No rash noted. No diaphoresis. No erythema. No pallor. No clubbing, cyanosis, or edema.  Psychiatric: Normal mood and affect. Behavior is normal. Judgment and thought content normal.  CTa [facility] Nov 2013 reported nl > requested copy 08/30/2012 and again 09/19/2012       Assessment & Plan:

## 2012-09-19 NOTE — Patient Instructions (Addendum)
Please see patient coordinator before you leave today  to schedule sinus ct and get your ct report of chest from [facility]   Work on inhaler technique:  relax and gently blow all the way out then take a nice smooth deep breath back in, triggering the inhaler at same time you start breathing in.  Hold for up to 5 seconds if you can.  Rinse and gargle with water when done   If your mouth or throat starts to bother you,   I suggest you time the inhaler to your dental care and after using the inhaler(s) brush teeth and tongue with a baking soda containing toothpaste and when you rinse this out, gargle with it first to see if this helps your mouth and throat.     For cough or wheeze or shortness of breath > symbicort 80 2 every 12 hours  If still coughing on delsym 2 tsp every 12 hours ok to supplement with demerol 50 mg up to one every 4 hours but may cause drowsiness   See Tammy NP w/in 2 weeks with all your medications, even over the counter meds, separated in two separate bags, the ones you take no matter what vs the ones you stop once you feel better and take only as needed when you feel you need them.   Tammy  will generate for you a new user friendly medication calendar that will put [place] all on the same page re: your medication use.     Without this process, it simply isn't possible to assure that we are providing  your outpatient care  with  the attention to detail we feel you deserve.   If we cannot assure that you're getting that kind of care,  then we cannot manage your problem effectively from this clinic.  Once you have seen Tammy and we are sure that we're all on the same page with your medication use she will arrange follow up with me.

## 2012-09-19 NOTE — Assessment & Plan Note (Signed)
Clinical dx only - hfa 75% p extensive coaching 09/19/2012

## 2012-09-19 NOTE — Assessment & Plan Note (Signed)
Most likely this is  Classic Upper airway cough syndrome, so named because it's frequently impossible to sort out how much is  CR/sinusitis with freq throat clearing (which can be related to primary GERD)   vs  causing  secondary (" extra esophageal")  GERD from wide swings in gastric pressure that occur with throat clearing, often  promoting self use of mint and menthol lozenges that reduce the lower esophageal sphincter tone and exacerbate the problem further in a cyclical fashion.   These are the same pts (now being labeled as having "irritable larynx syndrome" by some cough centers) who not infrequently have a history of having failed to tolerate ace inhibitors,  dry powder inhalers or biphosphonates or report having atypical reflux symptoms that don't respond to standard doses of PPI , and are easily confused as having aecopd or asthma flares by even experienced allergists/ pulmonologists.  Also explained  The standardized cough guidelines recently published in Chest by Lissa Morales in 2006  are a multiple step process (up to 12!) , not a single office visit,  and are intended  to address this problem logically,  with an alogrithm dependent on response to empiric treatment at  each progressive step  to determine a specific diagnosis with  minimal addtional testing needed. Therefore if compliance is an issue or can't be accurately verified then it's very unlikely the standard evaluation and treatment will be successful here.    Furthermore, response to therapy (other than acute cough suppression, which should only be used short term with avoidance of narcotic containing cough syrups if possible), can be a gradual process for which the patient may not receive immediate benefit.  Unlike going to an eye doctor where the right rx is almost always the first one and is immediately effective, this is almost never the case in the management of chronic cough syndromes and the patient needs to commit up front  to compliance with recommendations and have the patience to wait out a response for up to 6 weeks of therapy directed at the likely underlying problem(s).    For now no change rx  And return w/in two weeks for a trust but verify ov = med reconciliation.  To keep things simple, I have asked the patient to first separate medicines that are perceived as maintenance, that is to be taken daily "no matter what", from those medicines that are taken on only on an as-needed basis and I have given the patient examples of both, and then return to see our NP to generate a  detailed  medication calendar which should be followed until the next physician sees the patient and updates it.

## 2012-09-20 ENCOUNTER — Encounter: Payer: Self-pay | Admitting: Internal Medicine

## 2012-09-22 ENCOUNTER — Inpatient Hospital Stay: Admission: RE | Admit: 2012-09-22 | Payer: Medicare Other | Source: Ambulatory Visit

## 2012-09-29 ENCOUNTER — Ambulatory Visit (INDEPENDENT_AMBULATORY_CARE_PROVIDER_SITE_OTHER)
Admission: RE | Admit: 2012-09-29 | Discharge: 2012-09-29 | Disposition: A | Discharge status: Home / Self Care | Payer: Medicare Other | Source: Ambulatory Visit | Attending: Internal Medicine | Admitting: Internal Medicine

## 2012-09-29 ENCOUNTER — Encounter: Payer: Self-pay | Admitting: Internal Medicine

## 2012-09-29 DIAGNOSIS — R05 Cough: Secondary | ICD-10-CM

## 2012-10-03 NOTE — Progress Notes (Signed)
Quick Note:  Called, spoke with pt. Informed her of CT results and recs per Dr. Melvyn Novas. She verbalized understanding and voiced no further questions or concerns at this time. She is aware of pending OV with TP on Jan 6 at 11 am. ______

## 2012-10-09 ENCOUNTER — Ambulatory Visit (INDEPENDENT_AMBULATORY_CARE_PROVIDER_SITE_OTHER): Payer: Medicare Other | Admitting: Adult Health

## 2012-10-09 ENCOUNTER — Encounter: Payer: Self-pay | Admitting: Adult Health

## 2012-10-09 VITALS — BP 104/64 | HR 97 | Temp 97.1°F | Ht 66.0 in | Wt 247.4 lb

## 2012-10-09 DIAGNOSIS — R059 Cough, unspecified: Secondary | ICD-10-CM

## 2012-10-09 DIAGNOSIS — R05 Cough: Secondary | ICD-10-CM

## 2012-10-09 MED ORDER — MEPERIDINE HCL 50 MG PO TABS: 50.0000 mg | ORAL_TABLET | ORAL | Status: DC | PRN

## 2012-10-09 NOTE — Patient Instructions (Addendum)
Follow med calendar closely and bring to each visit  follow up Dr. Melvyn Novas  In 6 weeks and As needed

## 2012-10-12 ENCOUNTER — Encounter: Payer: Self-pay | Admitting: Adult Health

## 2012-10-12 NOTE — Progress Notes (Signed)
Subjective:    Patient ID: Unknown Audrey Hill, female    DOB: 1956-05-28  MRN: [id number]  HPI  22 yobf smoker with new onset persistent cough starting winter of 2013 referred 08/30/2012 by Dr Kennon Holter to pulmonary clinic.   08/30/2012 1st pulmonary eval cc indolent onset persistent cough x a year comes and goes in terms of severity much worse x 3 weeks then breathing got worse and  not really better even p prednisone.  Worse when lie down at night, wakes up coughing > min green mucus, occ blood tinged > to ER at [facility] with neg CT chest first of November. Has gen bilateral ant cp only with coughing.- on advair but not helping either, best is hydromet but can't afford it.  Only really sob when coughing.   rec Stop advair and only symbicort 80 2 puffs every 12 hours if needed for breathing Work on inhaler technique .    Try prilosec 20mg   Take 30-60 min before first meal of the day and Pepcid 20 mg one bedtime until return GERD  Diet reviewed  Take delsym two tsp every 12 hours and supplement if needed with  roxicet one every  4 hours to suppress the urge to cough.    09/19/2012 f/u ov/Wert cc confused with inhalers, no change cough, not sure she followed any of the above instructions, requesting narcotics for cough. No purulent sections, cough worse at hs  >cough suppression , CT sinus neg   10/10/11 Follow up and medication calendar  new med calendar - pt brought her daily meds with her today.   Informs up she  was in [facility] for influenza and bronchitis - still c/o hoarseness, SOB/burning in chest at night.. Records requested.  We reviewed all her medications and organized them into a medication calendar with patient education. Patient does request additional Demerol for cough control as she said this helped her. The most of all. She denies any hemoptysis, orthopnea, PND, or leg swelling  ROS :  Constitutional:   No  weight loss, night sweats,  Fevers, chills,  +fatigue, or   lassitude.  HEENT:   No headaches,  Difficulty swallowing,  Tooth/dental problems, or  Sore throat,                No sneezing, itching, ear ache,  +nasal congestion, post nasal drip,   CV:  No chest pain,  Orthopnea, PND, swelling in lower extremities, anasarca, dizziness, palpitations, syncope.   GI  No heartburn, indigestion, abdominal pain, nausea, vomiting, diarrhea, change in bowel habits, loss of appetite, bloody stools.   Resp:    No coughing up of blood.   No chest wall deformity  Skin: no rash or lesions.  GU: no dysuria, change in color of urine, no urgency or frequency.  No flank pain, no hematuria   MS:  No joint pain or swelling.  No decreased range of motion.  No back pain.  Psych:  No change in mood or affect. No depression or anxiety.  No memory loss.            Objective:   Physical Exam  Amb obese min hoarse bf nad  Gen: No acute distress. Well nourished and well groomed.  Neurological: Alert and oriented to person, place, and time. Coordination normal.  Head: Normocephalic and atraumatic.  Eyes: Conjunctivae are normal. Pupils are equal, round, and reactive to light. No scleral icterus.  Neck: Normal range of motion. Neck supple. No tracheal deviation or thyromegaly  present. No cervical lymphadenopathy.  Cardiovascular: Normal rate, regular rhythm, normal heart sounds and intact distal pulses. Exam reveals no gallop and no friction rub. No murmur heard.  Respiratory: Effort normal. No respiratory distress. No chest wall tenderness. Breath sounds normal. No wheezes, rales or rhonchi.  GI: Soft. Bowel sounds are normal. The abdomen is soft and nontender. There is no rebound and no guarding.  Musculoskeletal: Normal range of motion. Extremities are nontender.  Skin: Skin is warm and dry. No rash noted. No diaphoresis. No erythema. No pallor. No clubbing, cyanosis, or edema.  Psychiatric: Normal mood and affect. Behavior is normal. Judgment and thought  content normal.    CTa [facility] Nov 2013 reported nl > requested copy 08/30/2012 and again 09/19/2012  CT sinus 09/2012 neg for acute infxn      Assessment & Plan:

## 2012-10-12 NOTE — Assessment & Plan Note (Signed)
Continue on current regimen. For cough control. Demerol was refilled.  Patient's medications were reviewed today and patient education was given. Computerized medication calendar was adjusted/completed

## 2012-10-18 ENCOUNTER — Telehealth: Payer: Self-pay | Admitting: Internal Medicine

## 2012-10-18 NOTE — Telephone Encounter (Signed)
Prednisone 10 mg take  4 each am x 2 days,   2 each am x 2 days,  1 each am x2days and stop then ov at end of ov

## 2012-10-18 NOTE — Telephone Encounter (Signed)
Called and spoke with pt and she stated that she has had a non productive cough with some wheezing  x 2 days that delsym has not been helping.  Denies any fever or any other symptoms.  Pt is requesting that something be called to the pharmacy.  MW please advise. Thanks   Allergies  Allergen Reactions  . Ibuprofen (Ibuprofen) Other (See Comments)    Upset gi  . Nitroglycerin Hives  . Aspirin Rash

## 2012-10-19 MED ORDER — PREDNISONE 10 MG PO TABS: ORAL_TABLET | ORAL | Status: DC

## 2012-10-19 NOTE — Telephone Encounter (Signed)
Pt aware of MW recs. Nothing further was needed

## 2012-10-24 ENCOUNTER — Telehealth: Payer: Self-pay | Admitting: Internal Medicine

## 2012-10-24 NOTE — Telephone Encounter (Signed)
I spoke with pt. She stated she is currently over at [facility]. She stated she had to be rushed there for increase SOB and cough. She is not getting any better and does not geel like they are helping her at all there. Feels like she is just getting worse. The doctors does not seems to be listening to her nor wanting to help her. Pt is requesting a transfer to [facility] or [facility]. Please advise Dr. Melvyn Novas thanks

## 2012-10-24 NOTE — Telephone Encounter (Signed)
She will need to speak to her doctors there to request it and if on a floor a [facility] will be admitted to our hospital service called [facility] (the hospital service at [facility] knows how to accomplish this transfer if she'll request it) and I will see in consultation

## 2012-10-24 NOTE — Telephone Encounter (Signed)
lmomtcb at # provided above to inform pt of below

## 2012-10-25 ENCOUNTER — Ambulatory Visit: Payer: Medicare Other | Admitting: Internal Medicine

## 2012-10-25 NOTE — Telephone Encounter (Signed)
The pt is aware of MW recs and she says that Dr. Glade Lloyd was called in to see her yesterday. He will be doing some tests but she says her daughter would still like for her to be transferred to [facility]. The pt and daughter will speak with the physicians caring for her at [place] about this matter.

## 2012-11-10 ENCOUNTER — Ambulatory Visit (INDEPENDENT_AMBULATORY_CARE_PROVIDER_SITE_OTHER): Payer: Medicare Other | Admitting: Internal Medicine

## 2012-11-10 ENCOUNTER — Encounter: Payer: Self-pay | Admitting: Internal Medicine

## 2012-11-10 VITALS — BP 160/100 | HR 101 | Temp 97.0°F | Ht 66.0 in | Wt 251.6 lb

## 2012-11-10 DIAGNOSIS — F17200 Nicotine dependence, unspecified, uncomplicated: Secondary | ICD-10-CM

## 2012-11-10 DIAGNOSIS — R05 Cough: Secondary | ICD-10-CM

## 2012-11-10 NOTE — Patient Instructions (Addendum)
Stop all smoking now  Add Pepcid 20 mg one at bedtime   Delsym 2 tsp every 12 hours if coughing   GERD (REFLUX)  is an extremely common cause of respiratory symptoms, many times with no significant heartburn at all.    It can be treated with medication, but also with lifestyle changes including avoidance of late meals, excessive alcohol, smoking cessation, and avoid fatty foods, chocolate, peppermint, colas, red wine, and acidic juices such as orange juice.  NO MINT OR MENTHOL PRODUCTS SO NO COUGH DROPS  USE SUGARLESS CANDY INSTEAD (jolley ranchers or Stover's)  NO OIL BASED VITAMINS - use powdered substitutes.    See Tammy NP w/in 2 weeks with all your medications, even over the counter meds, separated in two separate bags, the ones you take no matter what vs the ones you stop once you feel better and take only as needed when you feel you need them.   Tammy  will generate for you a new user friendly medication calendar that will put [place] all on the same page re: your medication use.     Without this process, it simply isn't possible to assure that we are providing  your outpatient care  with  the attention to detail we feel you deserve.   If we cannot assure that you're getting that kind of care,  then we cannot manage your problem effectively from this clinic.  Once you have seen Tammy and we are sure that we're all on the same page with your medication use she will arrange follow up with me with PFTs

## 2012-11-10 NOTE — Progress Notes (Signed)
Subjective:    Patient ID: Audrey Hill, female    DOB: 22-Mar-1956  MRN: [id number]  HPI  61 yobf smoker with new onset persistent cough starting winter of 2013 referred 08/30/2012 by Dr Kennon Holter to pulmonary clinic.   08/30/2012 1st pulmonary eval cc indolent onset persistent cough x a year comes and goes in terms of severity much worse x 3 weeks then breathing got worse and  not really better even p prednisone.  Worse when lie down at night, wakes up coughing > min green mucus, occ blood tinged > to ER at [facility] with neg CT chest first of November. Has gen bilateral ant cp only with coughing.- on advair but not helping either, best is hydromet but can't afford it.  Only really sob when coughing.   rec Stop advair and only symbicort 80 2 puffs every 12 hours if needed for breathing Work on inhaler technique .    Try prilosec 20mg   Take 30-60 min before first meal of the day and Pepcid 20 mg one bedtime until return GERD  Diet reviewed  Take delsym two tsp every 12 hours and supplement if needed with  roxicet one every  4 hours to suppress the urge to cough.    09/19/2012 f/u ov/Wert cc confused with inhalers, no change cough, not sure she followed any of the above instructions, requesting narcotics for cough. No purulent sections, cough worse at hs  >cough suppression , CT sinus neg   10/10/11 Follow up and medication calendar  new med calendar - pt brought her daily meds with her today.   Informs up she  was in [facility] for influenza and bronchitis - still c/o hoarseness, SOB/burning in chest at night.. Records requested.  We reviewed all her medications and organized them into a medication calendar with patient education. Patient does request additional Demerol for cough control as she said this helped her. The most of all. She denies any hemoptysis, orthopnea, PND, or leg swelling rec No change in medications > follow med calendar and bring to each ov    11/10/2012 f/u ov/Wert  Still  smoking cc no change in symptoms off prednisone x 3 weeks dry cough worse when lie down assoc with generalized chest aching only better with narcotics, says she's using med calendar but didn't bring as instructed.  No obvious daytime variabilty to chronic cough or cp or chest tightness, subjective wheeze overt sinus or hb symptoms. No unusual exp hx or h/o childhood pna/ asthma or premature birth to her knowledge.   . Also denies any obvious fluctuation of symptoms with weather or environmental changes or other aggravating or alleviating factors except as outlined above    ROS  The following are not active complaints unless bolded sore throat, dysphagia, dental problems, itching, sneezing,  nasal congestion or excess/ purulent secretions, ear ache,   fever, chills, sweats, unintended wt loss, pleuritic or exertional cp, hemoptysis,  orthopnea pnd or leg swelling, presyncope, palpitations, heartburn, abdominal pain, anorexia, nausea, vomiting, diarrhea  or change in bowel or urinary habits, change in stools or urine, dysuria,hematuria,  rash, arthralgias, visual complaints, headache, numbness weakness or ataxia or problems with walking or coordination,  change in mood/affect or memory.                     Objective:   Physical Exam  Wt Readings from Last 3 Encounters:  11/10/12 251 lb 9.6 oz (114.125 kg)  10/09/12 247 lb 6.4 oz (112.22 kg)  09/19/12 247 lb 9.6 oz (112.311 kg)    Amb obese min hoarse bf nad  Gen: No acute distress. Well nourished and well groomed.  Neurological: Alert and oriented to person, place, and time. Coordination normal.  Head: Normocephalic and atraumatic.  Eyes: Conjunctivae are normal. Pupils are equal, round, and reactive to light. No scleral icterus.  Neck: Normal range of motion. Neck supple. No tracheal deviation or thyromegaly present. No cervical lymphadenopathy.  Cardiovascular: Normal rate, regular rhythm, normal heart sounds and intact distal  pulses. Exam reveals no gallop and no friction rub. No murmur heard.  Respiratory: Effort normal. No respiratory distress. No chest wall tenderness. Breath sounds normal. No wheezes, rales or rhonchi.  GI: Soft. Bowel sounds are normal. The abdomen is soft and nontender. There is no rebound and no guarding.  Musculoskeletal: Normal range of motion. Extremities are nontender.  Skin: Skin is warm and dry. No rash noted. No diaphoresis. No erythema. No pallor. No clubbing, cyanosis, or edema.  Psychiatric: Normal mood and affect. Behavior is normal. Judgment and thought content normal.    CTa [facility] Aug 13 2012 > report in epic copied:  Neg pe/ no abnormality CT sinus 09/2012 neg for acute infxn      Assessment & Plan:

## 2012-11-11 ENCOUNTER — Telehealth: Payer: Self-pay | Admitting: Critical Care Medicine

## 2012-11-11 NOTE — Assessment & Plan Note (Addendum)
>   3 min  > 3 min discussion  I emphasized that although we never turn away smokers from the pulmonary clinic, we do ask that they understand that the recommendations that we make  won't work nearly as well in the presence of continued cigarette exposure.  In fact, we may very well  reach a point where we can't promise to help the patient if he/she can't quit smoking. (We can and will promise to try to help, we just can't promise what we recommend will really work - and also not willing to use narcotic cough meds in an active smoker with cough - the equivalent of "two wrongs does not make a right"

## 2012-11-11 NOTE — Assessment & Plan Note (Addendum)
-   sinus ct 09/29/2012 > Minimal mucosal thickening involving lateral aspect of left maxillary sinus with small oval density which may reflect small retention cyst or polyp but is very small. No other evidence of sinusitis. No air fluid levels are seen in the paranasal sinuses  Symptoms are markedly disproportionate to objective findings and not clear this is even  a lung problem but pt does appear to have difficult airway management issues.  DDX of  difficult airways managment all start with A and  include Adherence, Ace Inhibitors, Acid Reflux, Active Sinus Disease, Alpha 1 Antitripsin deficiency, Anxiety masquerading as Airways dz,  ABPA,  allergy(esp in young), Aspiration (esp in elderly), Adverse effects of DPI,  Active smokers, plus two Bs  = Bronchiectasis and Beta blocker use..and one C= CHF  Adherence is always the initial "prime suspect" and is a multilayered concern that requires a "trust but verify" approach in every patient - starting with knowing how to use medications, especially inhalers, correctly, keeping up with refills and understanding the fundamental difference between maintenance and prns vs those medications only taken for a very short course and then stopped and not refilled. Tried to do this via med calendar but didn't bring it so need a trust but verify approach here.  ? Acid reflux > max rx  Plus diet reviewed  Active smoking > discussed separately  No further narcotics recommended at this point

## 2012-11-11 NOTE — Telephone Encounter (Signed)
Received phone call from patient c/o of ongoing pain associated with chronic cough.  Patient states the cough is dry, nonproductive and unrelenting.  She was just seen by Dr. Melvyn Novas on 2/7 for this c/o and at that time the demerol was d/ced and she was prescribed delsyn and pepcid.  She states she has been using these regularly but this does not help the pain.  She describes the pain as coughing that causes her head to hurt radiating to her neck and shoulders.  She states the pain has been ongoing for 3 weeks.  Tylenol does not help.  She has no fevers/chills/N/V or chest pain.  She does state she has some wheezing.  On the phone the patient never coughed, talked in complete sentences and had no audible wheezing.   Assessment:  Chronic cough/Pain syndrome Plan:  I offered ultram for pain but the pain stated this medication causes N/V.  At that point I stated I could not call in narcotics for pain and if she felt she needed those she would need to go to the emergency room.  She stated she would go there for an evaluation.

## 2012-11-20 ENCOUNTER — Ambulatory Visit: Payer: Medicare Other | Admitting: Internal Medicine

## 2012-11-21 ENCOUNTER — Encounter: Payer: Self-pay | Admitting: Vascular Surgery

## 2012-11-22 ENCOUNTER — Other Ambulatory Visit: Payer: Self-pay | Admitting: *Deleted

## 2012-11-22 ENCOUNTER — Encounter: Payer: Self-pay | Admitting: Neurosurgery

## 2012-11-22 ENCOUNTER — Ambulatory Visit (INDEPENDENT_AMBULATORY_CARE_PROVIDER_SITE_OTHER): Payer: Medicare Other | Admitting: Neurosurgery

## 2012-11-22 ENCOUNTER — Other Ambulatory Visit (INDEPENDENT_AMBULATORY_CARE_PROVIDER_SITE_OTHER): Payer: Medicare Other | Admitting: *Deleted

## 2012-11-22 VITALS — BP 138/86 | HR 96 | Resp 16 | Ht 66.0 in | Wt 243.0 lb

## 2012-11-22 DIAGNOSIS — I6529 Occlusion and stenosis of unspecified carotid artery: Secondary | ICD-10-CM

## 2012-11-22 DIAGNOSIS — M79605 Pain in left leg: Secondary | ICD-10-CM | POA: Insufficient documentation
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
## 2012-11-24 ENCOUNTER — Encounter: Payer: Self-pay | Admitting: Adult Health

## 2012-11-24 ENCOUNTER — Ambulatory Visit (INDEPENDENT_AMBULATORY_CARE_PROVIDER_SITE_OTHER): Payer: Medicare Other | Admitting: Adult Health

## 2012-11-24 ENCOUNTER — Telehealth: Payer: Self-pay | Admitting: Internal Medicine

## 2012-11-24 VITALS — BP 146/92 | HR 93 | Temp 98.4°F

## 2012-11-24 MED ORDER — AMOXICILLIN-POT CLAVULANATE 875-125 MG PO TABS: 1.0000 | ORAL_TABLET | Freq: Two times a day (BID) | ORAL | Status: AC

## 2012-11-24 NOTE — Patient Instructions (Addendum)
Begin Augmentin 875mg  Twice daily  For 10 days  Mucinex DM Twice daily  As needed  Cough/congestion  Saline nasal rinses As needed   follow up Dr. Melvyn Novas  In 6 weeks with PFT  Please contact office for sooner follow up if symptoms do not improve or worsen or seek emergency care

## 2012-11-24 NOTE — Progress Notes (Signed)
Subjective:    Patient ID: Audrey Hill, female    DOB: [birth year]  MRN: [id number]  HPI  40 yobf smoker with new onset persistent cough starting winter of 2013 referred 08/30/2012 by Dr Kennon Holter to pulmonary clinic.   08/30/2012 1st pulmonary eval cc indolent onset persistent cough x a year comes and goes in terms of severity much worse x 3 weeks then breathing got worse and  not really better even p prednisone.  Worse when lie down at night, wakes up coughing > min green mucus, occ blood tinged > to ER at [facility] with neg CT chest first of November. Has gen bilateral ant cp only with coughing.- on advair but not helping either, best is hydromet but can't afford it.  Only really sob when coughing.   rec Stop advair and only symbicort 80 2 puffs every 12 hours if needed for breathing Work on inhaler technique .    Try prilosec 20mg   Take 30-60 min before first meal of the day and Pepcid 20 mg one bedtime until return GERD  Diet reviewed  Take delsym two tsp every 12 hours and supplement if needed with  roxicet one every  4 hours to suppress the urge to cough.    09/19/2012 f/u ov/Wert cc confused with inhalers, no change cough, not sure she followed any of the above instructions, requesting narcotics for cough. No purulent sections, cough worse at hs  >cough suppression , CT sinus neg   10/10/11 Follow up and medication calendar  new med calendar - pt brought her daily meds with her today.   Informs up she  was in [facility] for influenza and bronchitis - still c/o hoarseness, SOB/burning in chest at night.. Records requested.  We reviewed all her medications and organized them into a medication calendar with patient education. Patient does request additional Demerol for cough control as she said this helped her. The most of all. She denies any hemoptysis, orthopnea, PND, or leg swelling rec No change in medications > follow med calendar and bring to each ov    11/10/2012 f/u ov/Wert  Still  smoking cc no change in symptoms off prednisone x 3 weeks dry cough worse when lie down assoc with generalized chest aching only better with narcotics, says she's using med calendar but didn't bring as instructed.  No obvious daytime variabilty to chronic cough or cp or chest tightness, subjective wheeze overt sinus or hb symptoms. No unusual exp hx or h/o childhood pna/ asthma or premature birth to her knowledge.   .   11/24/2012 Follow up and med review     Patient returns for a two-week followup and medication review. unfortunately patient did not make her medications and review today. Patient complains that she has sinus congestion, sinus pain, and pressure along with discolored mucus, with green, thick discharge Patient request a refill of her off OxyContin. I explained that we would not be able to refill her narcotic request. Today. Patient denies any hemoptysis, orthopnea, PND, or leg swelling.     ROS:  Constitutional:   No  weight loss, night sweats,  Fevers, chills, fatigue, or  lassitude.  HEENT:   No headaches,  Difficulty swallowing,  Tooth/dental problems, or  Sore throat,                No sneezing, itching, ear ache,  +nasal congestion, post nasal drip,   CV:  No chest pain,  Orthopnea, PND, swelling in lower extremities, anasarca, dizziness, palpitations, syncope.  GI  No heartburn, indigestion, abdominal pain, nausea, vomiting, diarrhea, change in bowel habits, loss of appetite, bloody stools.   Resp:   No chest wall deformity  Skin: no rash or lesions.  GU: no dysuria, change in color of urine, no urgency or frequency.  No flank pain, no hematuria   MS:  No joint pain or swelling.  No decreased range of motion.  No back pain.  Psych:  No change in mood or affect. No depression or anxiety.  No memory loss.               Objective:   Physical Exam     Amb obese min hoarse bf nad  Gen: No acute distress. Well nourished and well groomed.   Neurological: Alert and oriented to person, place, and time. Coordination normal.  Head: Normocephalic and atraumatic. Max sinus tenderness  Eyes: Conjunctivae are normal. Pupils are equal, round, and reactive to light. No scleral icterus.  Neck: Normal range of motion. Neck supple. No tracheal deviation or thyromegaly present. No cervical lymphadenopathy.  Cardiovascular: Normal rate, regular rhythm, normal heart sounds and intact distal pulses. Exam reveals no gallop and no friction rub. No murmur heard.  Respiratory: Effort normal. No respiratory distress. No chest wall tenderness. Breath sounds normal. No wheezes, rales or rhonchi.  GI: Soft. Bowel sounds are normal. The abdomen is soft and nontender. There is no rebound and no guarding.  Musculoskeletal: Normal range of motion. Extremities are nontender.  Skin: Skin is warm and dry. No rash noted. No diaphoresis. No erythema. No pallor. No clubbing, cyanosis, or edema.  Psychiatric: Normal mood and affect. Behavior is normal. Judgment and thought content normal.    CTa [facility] Aug 13 2012 > report in epic copied:  Neg pe/ no abnormality CT sinus 09/2012 neg for acute infxn      Assessment & Plan:

## 2012-11-24 NOTE — Assessment & Plan Note (Signed)
Mild flare w/ sinusitis    Plan  Begin Augmentin 875mg  Twice daily  For 10 days  Mucinex DM Twice daily  As needed  Cough/congestion  Saline nasal rinses As needed   follow up Dr. Melvyn Novas  In 6 weeks with PFT  Please contact office for sooner follow up if symptoms do not improve or worsen or seek emergency care  No narcotics given

## 2012-11-24 NOTE — Telephone Encounter (Signed)
Spoke with pt's daughter She states that the pt needs something to help with her cough She was just seen by TP today, and has not started on any meds that TP rec as of yet I went over all of the AVS instructions with her and advised she needs to get started on her abx asap If gets worse over the w/e go to ED and if she is not improving at all, should give [place] a call next wk She verbalized understanding and states nothing further needed

## 2013-01-05 ENCOUNTER — Ambulatory Visit: Payer: Medicare Other | Admitting: Internal Medicine

## 2013-01-15 ENCOUNTER — Ambulatory Visit: Payer: Self-pay | Admitting: Pain Medicine

## 2013-02-06 ENCOUNTER — Ambulatory Visit: Payer: Medicare Other | Admitting: Adult Health

## 2013-02-06 ENCOUNTER — Telehealth: Payer: Self-pay | Admitting: Internal Medicine

## 2013-02-06 NOTE — Telephone Encounter (Signed)
i spoke with pt. She is scheduled to come in and see TP at 2:45. Nothing further was needed

## 2013-02-14 ENCOUNTER — Encounter (HOSPITAL_COMMUNITY): Payer: Self-pay | Admitting: Emergency Medicine

## 2013-02-14 ENCOUNTER — Emergency Department (HOSPITAL_COMMUNITY): Payer: Medicare Other

## 2013-02-14 ENCOUNTER — Encounter: Payer: Self-pay | Admitting: Adult Health

## 2013-02-14 ENCOUNTER — Emergency Department (HOSPITAL_COMMUNITY)
Admission: EM | Admit: 2013-02-14 | Discharge: 2013-02-14 | Disposition: A | Discharge status: Home / Self Care | Payer: Medicare Other | Attending: Emergency Medicine | Admitting: Emergency Medicine

## 2013-02-14 ENCOUNTER — Ambulatory Visit (INDEPENDENT_AMBULATORY_CARE_PROVIDER_SITE_OTHER): Payer: Medicare Other | Admitting: Adult Health

## 2013-02-14 VITALS — BP 122/74 | HR 85 | Temp 98.0°F | Ht 64.5 in | Wt 246.0 lb

## 2013-02-14 DIAGNOSIS — Z8739 Personal history of other diseases of the musculoskeletal system and connective tissue: Secondary | ICD-10-CM | POA: Insufficient documentation

## 2013-02-14 DIAGNOSIS — I10 Essential (primary) hypertension: Secondary | ICD-10-CM | POA: Insufficient documentation

## 2013-02-14 DIAGNOSIS — Z765 Malingerer [conscious simulation]: Secondary | ICD-10-CM

## 2013-02-14 DIAGNOSIS — Z87891 Personal history of nicotine dependence: Secondary | ICD-10-CM | POA: Insufficient documentation

## 2013-02-14 DIAGNOSIS — Z8639 Personal history of other endocrine, nutritional and metabolic disease: Secondary | ICD-10-CM | POA: Insufficient documentation

## 2013-02-14 DIAGNOSIS — J45909 Unspecified asthma, uncomplicated: Secondary | ICD-10-CM

## 2013-02-14 DIAGNOSIS — M542 Cervicalgia: Secondary | ICD-10-CM | POA: Insufficient documentation

## 2013-02-14 DIAGNOSIS — Z7902 Long term (current) use of antithrombotics/antiplatelets: Secondary | ICD-10-CM | POA: Insufficient documentation

## 2013-02-14 DIAGNOSIS — Z8673 Personal history of transient ischemic attack (TIA), and cerebral infarction without residual deficits: Secondary | ICD-10-CM | POA: Insufficient documentation

## 2013-02-14 DIAGNOSIS — G40909 Epilepsy, unspecified, not intractable, without status epilepticus: Secondary | ICD-10-CM | POA: Insufficient documentation

## 2013-02-14 DIAGNOSIS — R011 Cardiac murmur, unspecified: Secondary | ICD-10-CM | POA: Insufficient documentation

## 2013-02-14 DIAGNOSIS — Z8709 Personal history of other diseases of the respiratory system: Secondary | ICD-10-CM | POA: Insufficient documentation

## 2013-02-14 DIAGNOSIS — K219 Gastro-esophageal reflux disease without esophagitis: Secondary | ICD-10-CM | POA: Insufficient documentation

## 2013-02-14 DIAGNOSIS — Z79899 Other long term (current) drug therapy: Secondary | ICD-10-CM | POA: Insufficient documentation

## 2013-02-14 DIAGNOSIS — F411 Generalized anxiety disorder: Secondary | ICD-10-CM | POA: Insufficient documentation

## 2013-02-14 DIAGNOSIS — R071 Chest pain on breathing: Secondary | ICD-10-CM | POA: Insufficient documentation

## 2013-02-14 DIAGNOSIS — Z862 Personal history of diseases of the blood and blood-forming organs and certain disorders involving the immune mechanism: Secondary | ICD-10-CM | POA: Insufficient documentation

## 2013-02-14 DIAGNOSIS — F1910 Other psychoactive substance abuse, uncomplicated: Secondary | ICD-10-CM | POA: Insufficient documentation

## 2013-02-14 DIAGNOSIS — Z8679 Personal history of other diseases of the circulatory system: Secondary | ICD-10-CM | POA: Insufficient documentation

## 2013-02-14 DIAGNOSIS — R0789 Other chest pain: Secondary | ICD-10-CM

## 2013-02-14 LAB — COMPREHENSIVE METABOLIC PANEL
AST: 18 U/L (ref 0–37)
Albumin: 3.7 g/dL (ref 3.5–5.2)
Chloride: 108 mEq/L (ref 96–112)
Creatinine, Ser: 0.91 mg/dL (ref 0.50–1.10)
Potassium: 3.6 mEq/L (ref 3.5–5.1)
Total Bilirubin: 0.3 mg/dL (ref 0.3–1.2)
Total Protein: 6.8 g/dL (ref 6.0–8.3)

## 2013-02-14 LAB — APTT: aPTT: 30 seconds (ref 24–37)

## 2013-02-14 LAB — CBC WITH DIFFERENTIAL/PLATELET
Basophils Absolute: 0 10*3/uL (ref 0.0–0.1)
Basophils Relative: 0 % (ref 0–1)
Eosinophils Absolute: 0.1 10*3/uL (ref 0.0–0.7)
MCH: 26 pg (ref 26.0–34.0)
MCHC: 33.2 g/dL (ref 30.0–36.0)
Monocytes Absolute: 0.4 10*3/uL (ref 0.1–1.0)
Neutro Abs: 2.6 10*3/uL (ref 1.7–7.7)
Neutrophils Relative %: 52 % (ref 43–77)
RDW: 13.9 % (ref 11.5–15.5)

## 2013-02-14 LAB — TROPONIN I: Troponin I: <0.3 ng/mL (ref <0.30)

## 2013-02-14 LAB — PROTIME-INR
INR: 0.92 (ref 0.00–1.49)
Prothrombin Time: 12.3 seconds (ref 11.6–15.2)

## 2013-02-14 MED ORDER — CYCLOBENZAPRINE HCL 10 MG PO TABS: 10.0000 mg | ORAL_TABLET | Freq: Three times a day (TID) | ORAL | Status: DC | PRN

## 2013-02-14 MED ORDER — FENTANYL CITRATE 0.05 MG/ML IJ SOLN
50.0000 ug | Freq: Once | INTRAMUSCULAR | Status: AC
  Administered 2013-02-14: 14:00:00 via INTRAVENOUS
  Filled 2013-02-14: qty 2

## 2013-02-14 MED ORDER — CEFDINIR 300 MG PO CAPS: 300.0000 mg | ORAL_CAPSULE | Freq: Two times a day (BID) | ORAL | Status: DC

## 2013-02-14 MED ORDER — CYCLOBENZAPRINE HCL 10 MG PO TABS
10.0000 mg | ORAL_TABLET | Freq: Once | ORAL | Status: AC
  Administered 2013-02-14: 10 mg via ORAL
  Filled 2013-02-14: qty 1

## 2013-02-14 NOTE — Patient Instructions (Addendum)
Omnicef Twice daily  For 7 days -take with food.  Mucinex DM Twice daily  As needed  Cough/congestion  Follow up with Family doctor, Dr. Kennon Holter regarding pain management issues.  Follow up Dr. Melvyn Novas  In 6 weeks with PFT  Please contact office for sooner follow up if symptoms do not improve or worsen or seek emergency care

## 2013-02-14 NOTE — ED Notes (Signed)
Pt here for c/o chest pain x2 days with increase pain pt thought it was gas. pain radiates down lt arm pt stated take she took some tylenol no relief.Pt has an allergy to AsA positive for n/v sweating,dizziness,

## 2013-02-14 NOTE — Progress Notes (Signed)
*  PRELIMINARY RESULTS* Vascular Ultrasound Lower extremity venous duplex has been completed.  Preliminary findings: Negative for DVT.  Landry Mellow, RDMS, RVT  02/14/2013, 1:51 PM

## 2013-02-14 NOTE — Progress Notes (Signed)
Subjective:    Patient ID: Audrey Hill, female    DOB: 1956/07/10  MRN: [id number]  HPI 57 yobf smoker with new onset persistent cough starting winter of 2013 referred 08/30/2012 by Dr Kennon Holter to pulmonary clinic.   08/30/2012 1st pulmonary eval cc indolent onset persistent cough x a year comes and goes in terms of severity much worse x 3 weeks then breathing got worse and  not really better even p prednisone.  Worse when lie down at night, wakes up coughing > min green mucus, occ blood tinged > to ER at [facility] with neg CT chest first of November. Has gen bilateral ant cp only with coughing.- on advair but not helping either, best is hydromet but can't afford it.  Only really sob when coughing.   rec Stop advair and only symbicort 80 2 puffs every 12 hours if needed for breathing Work on inhaler technique .    Try prilosec 20mg   Take 30-60 min before first meal of the day and Pepcid 20 mg one bedtime until return GERD  Diet reviewed  Take delsym two tsp every 12 hours and supplement if needed with  roxicet one every  4 hours to suppress the urge to cough.    09/19/2012 f/u ov/Wert cc confused with inhalers, no change cough, not sure she followed any of the above instructions, requesting narcotics for cough. No purulent sections, cough worse at hs  >cough suppression , CT sinus neg   10/10/11 Follow up and medication calendar  new med calendar - pt brought her daily meds with her today.   Informs up she  was in [facility] for influenza and bronchitis - still c/o hoarseness, SOB/burning in chest at night.. Records requested.  We reviewed all her medications and organized them into a medication calendar with patient education. Patient does request additional Demerol for cough control as she said this helped her. The most of all. She denies any hemoptysis, orthopnea, PND, or leg swelling rec No change in medications > follow med calendar and bring to each ov    11/10/2012 f/u ov/Wert  Still  smoking cc no change in symptoms off prednisone x 3 weeks dry cough worse when lie down assoc with generalized chest aching only better with narcotics, says she's using med calendar but didn't bring as instructed.     11/24/2012 Follow up and med review     Patient returns for a two-week followup and medication review. Unfortunately patient did not bring her medications and review today. Patient complains that she has sinus congestion, sinus pain, and pressure along with discolored mucus, with green, thick discharge Patient request a refill of her off OxyContin. I explained that we would not be able to refill her narcotic request. Today. Patient denies any hemoptysis, orthopnea, PND, or leg swelling. >>augmentin x 10 d   02/14/2013 Acute OV  Complains of prod cough with green mucus, increased SOB, wheezing, chest tightness, sweats x2days.  Denies hemoptysis , chest pain, edema, orthopnea, fever or calf pain.  Pt did not return for follow up with PFT as recommended. Discussed importance of follow up and  Need for pulmonary fxn test. She said she will reschedule.     She complains of neck and ear pain. No ear drainage or change in hearing. No neck stiffness or fever. No rash. She request pain medications.  I offered rx for cough control -she became very angry -demanding that pain meds be given.  I explained that we would be glad  to care for her respiratory symptoms but did not feel narcotics were indicated for today's visit. Suggested we get her an appointment with her PCP for pain management.  She said she would go to ER for pain meds  On return to exam room to discuss discharge instructions, pt had left office.    ROS:  Constitutional:   No  weight loss, night sweats,  Fevers, chills, fatigue, or  lassitude.  HEENT:   No headaches,  Difficulty swallowing,  Tooth/dental problems, or  Sore throat,                No sneezing, itching, ear ache,  +nasal congestion, post nasal drip,   CV:   No chest pain,  Orthopnea, PND, swelling in lower extremities, anasarca, dizziness, palpitations, syncope.   GI  No heartburn, indigestion, abdominal pain, nausea, vomiting, diarrhea, change in bowel habits, loss of appetite, bloody stools.   Resp:   No chest wall deformity  Skin: no rash or lesions.  GU: no dysuria, change in color of urine, no urgency or frequency.  No flank pain, no hematuria   MS:     No back pain.+neck pain   Psych:  No change in mood or affect. No depression or anxiety.  No memory loss.               Objective:   Physical Exam     Amb obese min hoarse bf nad  Gen: No acute distress. Well nourished Neurological: Alert and oriented to person, place, and time. Coordination normal.  Head: Normocephalic and atraumatic. Non tender sinus tenderness EAC clear, TM normal  Eyes: Conjunctivae are normal. Pupils are equal, round, and reactive to light. No scleral icterus.  Neck: Normal range of motion. Neck supple. No tracheal deviation or thyromegaly present. No cervical lymphadenopathy. No deformity noted.  Cardiovascular: Normal rate, regular rhythm, normal heart sounds and intact distal pulses. Exam reveals no gallop and no friction rub. No murmur heard.  Respiratory: Effort normal. No respiratory distress. No chest wall tenderness. Breath sounds normal. No wheezes, rales or rhonchi.  GI: Soft. Bowel sounds are normal. The abdomen is soft and nontender. There is no rebound and no guarding.  Musculoskeletal: Normal range of motion. Extremities are nontender.  Skin: Skin is warm and dry. No rash noted. No diaphoresis. No erythema. No pallor. No clubbing, cyanosis, or edema.  Psychiatric: angry , no focal deficits noted. .    CTa [facility] Aug 13 2012 > report in epic copied:  Neg pe/ no abnormality CT sinus 09/2012 neg for acute infxn      Assessment & Plan:
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
## 2013-02-15 NOTE — Assessment & Plan Note (Addendum)
URI w/ out apparent asthma flare  Needs to return for PFT  Advised for PCP regarding pain management  Will discuss with Dr. Melvyn Novas  Regarding narcotic requests.   Plan  Omnicef Twice daily  For 7 days -take with food.  Mucinex DM Twice daily  As needed  Cough/congestion  Follow up with Family doctor, Dr. Kennon Holter regarding pain management issues.  Follow up Dr. Melvyn Novas  In 6 weeks with PFT  Please contact office for sooner follow up if symptoms do not improve or worsen or seek emergency care

## 2013-03-05 ENCOUNTER — Ambulatory Visit: Payer: Self-pay | Admitting: Orthopedic Surgery

## 2013-03-05 LAB — CBC
HCT: 38 % (ref 35.0–47.0)
MCH: 26.8 pg (ref 26.0–34.0)
MCHC: 33.9 g/dL (ref 32.0–36.0)
MCV: 79 fL — ABNORMAL LOW (ref 80–100)
RBC: 4.8 10*6/uL (ref 3.80–5.20)
RDW: 15.4 % — ABNORMAL HIGH (ref 11.5–14.5)

## 2013-03-05 LAB — PROTIME-INR
INR: 0.9
Prothrombin Time: 12 secs (ref 11.5–14.7)

## 2013-03-05 LAB — BASIC METABOLIC PANEL
Calcium, Total: 9.1 mg/dL (ref 8.5–10.1)
Creatinine: 0.9 mg/dL (ref 0.60–1.30)
EGFR (African American): >60
EGFR (Non-African Amer.): >60
Osmolality: 287 (ref 275–301)
Potassium: 3.5 mmol/L (ref 3.5–5.1)
Sodium: 144 mmol/L (ref 136–145)

## 2013-03-05 LAB — APTT: Activated PTT: 30 secs (ref 23.6–35.9)

## 2013-03-20 ENCOUNTER — Inpatient Hospital Stay: Payer: Self-pay | Admitting: Orthopedic Surgery

## 2013-03-20 LAB — URINALYSIS, COMPLETE
Blood: NEGATIVE
Glucose,UR: NEGATIVE mg/dL (ref 0–75)
Nitrite: NEGATIVE
Ph: 5 (ref 4.5–8.0)
Protein: NEGATIVE
Specific Gravity: 1.015 (ref 1.003–1.030)
Squamous Epithelial: 3
WBC UR: <1 /HPF (ref 0–5)

## 2013-03-21 LAB — BASIC METABOLIC PANEL
Chloride: 110 mmol/L — ABNORMAL HIGH (ref 98–107)
Co2: 27 mmol/L (ref 21–32)
EGFR (African American): >60
EGFR (Non-African Amer.): 54 — ABNORMAL LOW
Potassium: 3.6 mmol/L (ref 3.5–5.1)
Sodium: 142 mmol/L (ref 136–145)

## 2013-03-21 LAB — PLATELET COUNT: Platelet: 198 10*3/uL (ref 150–440)

## 2013-03-22 LAB — URINALYSIS, COMPLETE
Leukocyte Esterase: NEGATIVE
Nitrite: NEGATIVE
Protein: 30
WBC UR: 3 /HPF (ref 0–5)

## 2013-03-23 LAB — CBC WITH DIFFERENTIAL/PLATELET
Basophil %: 0.8 %
Eosinophil #: 0.1 10*3/uL (ref 0.0–0.7)
HCT: 32.8 % — ABNORMAL LOW (ref 35.0–47.0)
HGB: 11 g/dL — ABNORMAL LOW (ref 12.0–16.0)
Lymphocyte #: 1.3 10*3/uL (ref 1.0–3.6)
MCH: 26.7 pg (ref 26.0–34.0)
MCHC: 33.6 g/dL (ref 32.0–36.0)
MCV: 80 fL (ref 80–100)
Monocyte %: 9 %
Neutrophil %: 67 %
Platelet: 173 10*3/uL (ref 150–440)
RBC: 4.12 10*6/uL (ref 3.80–5.20)
RDW: 15.6 % — ABNORMAL HIGH (ref 11.5–14.5)

## 2013-03-23 LAB — TROPONIN I
Troponin-I: 0.04 ng/mL
Troponin-I: 0.04 ng/mL

## 2013-03-24 LAB — URINE CULTURE

## 2013-03-26 LAB — COMPREHENSIVE METABOLIC PANEL
Anion Gap: 5 — ABNORMAL LOW (ref 7–16)
BUN: 7 mg/dL (ref 7–18)
Bilirubin,Total: 0.4 mg/dL (ref 0.2–1.0)
Calcium, Total: 8.8 mg/dL (ref 8.5–10.1)
Co2: 32 mmol/L (ref 21–32)
Creatinine: 1.07 mg/dL (ref 0.60–1.30)
EGFR (Non-African Amer.): 57 — ABNORMAL LOW
Glucose: 148 mg/dL — ABNORMAL HIGH (ref 65–99)
Potassium: 3.1 mmol/L — ABNORMAL LOW (ref 3.5–5.1)
SGOT(AST): 43 U/L — ABNORMAL HIGH (ref 15–37)
Total Protein: 6.6 g/dL (ref 6.4–8.2)

## 2013-05-10 ENCOUNTER — Encounter: Payer: Self-pay | Admitting: Gastroenterology

## 2013-05-10 ENCOUNTER — Other Ambulatory Visit: Payer: Self-pay | Admitting: Family Medicine

## 2013-05-10 DIAGNOSIS — Z1231 Encounter for screening mammogram for malignant neoplasm of breast: Secondary | ICD-10-CM

## 2013-05-23 ENCOUNTER — Ambulatory Visit: Payer: Medicare Other

## 2013-05-24 ENCOUNTER — Ambulatory Visit: Payer: Self-pay | Admitting: Orthopedic Surgery

## 2013-06-18 ENCOUNTER — Ambulatory Visit (INDEPENDENT_AMBULATORY_CARE_PROVIDER_SITE_OTHER): Payer: Medicare Other | Admitting: Gastroenterology

## 2013-06-18 ENCOUNTER — Emergency Department (HOSPITAL_COMMUNITY): Payer: Medicare Other

## 2013-06-18 ENCOUNTER — Telehealth: Payer: Self-pay

## 2013-06-18 ENCOUNTER — Encounter: Payer: Self-pay | Admitting: Gastroenterology

## 2013-06-18 ENCOUNTER — Emergency Department (HOSPITAL_COMMUNITY)
Admission: EM | Admit: 2013-06-18 | Discharge: 2013-06-18 | Disposition: A | Discharge status: Home / Self Care | Payer: Medicare Other | Attending: Emergency Medicine | Admitting: Emergency Medicine

## 2013-06-18 ENCOUNTER — Encounter (HOSPITAL_COMMUNITY): Payer: Self-pay

## 2013-06-18 VITALS — BP 116/70 | HR 88 | Ht 66.0 in | Wt 237.4 lb

## 2013-06-18 DIAGNOSIS — W010XXA Fall on same level from slipping, tripping and stumbling without subsequent striking against object, initial encounter: Secondary | ICD-10-CM | POA: Insufficient documentation

## 2013-06-18 DIAGNOSIS — Z8669 Personal history of other diseases of the nervous system and sense organs: Secondary | ICD-10-CM | POA: Insufficient documentation

## 2013-06-18 DIAGNOSIS — S59909A Unspecified injury of unspecified elbow, initial encounter: Secondary | ICD-10-CM | POA: Insufficient documentation

## 2013-06-18 DIAGNOSIS — I10 Essential (primary) hypertension: Secondary | ICD-10-CM | POA: Insufficient documentation

## 2013-06-18 DIAGNOSIS — Z862 Personal history of diseases of the blood and blood-forming organs and certain disorders involving the immune mechanism: Secondary | ICD-10-CM | POA: Insufficient documentation

## 2013-06-18 DIAGNOSIS — Z1211 Encounter for screening for malignant neoplasm of colon: Secondary | ICD-10-CM | POA: Insufficient documentation

## 2013-06-18 DIAGNOSIS — F17200 Nicotine dependence, unspecified, uncomplicated: Secondary | ICD-10-CM | POA: Insufficient documentation

## 2013-06-18 DIAGNOSIS — F411 Generalized anxiety disorder: Secondary | ICD-10-CM | POA: Insufficient documentation

## 2013-06-18 DIAGNOSIS — Z7902 Long term (current) use of antithrombotics/antiplatelets: Secondary | ICD-10-CM | POA: Insufficient documentation

## 2013-06-18 DIAGNOSIS — Z8673 Personal history of transient ischemic attack (TIA), and cerebral infarction without residual deficits: Secondary | ICD-10-CM | POA: Insufficient documentation

## 2013-06-18 DIAGNOSIS — M79631 Pain in right forearm: Secondary | ICD-10-CM

## 2013-06-18 DIAGNOSIS — K219 Gastro-esophageal reflux disease without esophagitis: Secondary | ICD-10-CM | POA: Insufficient documentation

## 2013-06-18 DIAGNOSIS — Z79899 Other long term (current) drug therapy: Secondary | ICD-10-CM | POA: Insufficient documentation

## 2013-06-18 DIAGNOSIS — Z8739 Personal history of other diseases of the musculoskeletal system and connective tissue: Secondary | ICD-10-CM | POA: Insufficient documentation

## 2013-06-18 DIAGNOSIS — Z7189 Other specified counseling: Secondary | ICD-10-CM | POA: Insufficient documentation

## 2013-06-18 DIAGNOSIS — E785 Hyperlipidemia, unspecified: Secondary | ICD-10-CM | POA: Insufficient documentation

## 2013-06-18 DIAGNOSIS — Y929 Unspecified place or not applicable: Secondary | ICD-10-CM | POA: Insufficient documentation

## 2013-06-18 DIAGNOSIS — M25562 Pain in left knee: Secondary | ICD-10-CM

## 2013-06-18 DIAGNOSIS — S6990XA Unspecified injury of unspecified wrist, hand and finger(s), initial encounter: Secondary | ICD-10-CM | POA: Insufficient documentation

## 2013-06-18 DIAGNOSIS — S8990XA Unspecified injury of unspecified lower leg, initial encounter: Secondary | ICD-10-CM | POA: Insufficient documentation

## 2013-06-18 DIAGNOSIS — Z0000 Encounter for general adult medical examination without abnormal findings: Secondary | ICD-10-CM | POA: Insufficient documentation

## 2013-06-18 DIAGNOSIS — R011 Cardiac murmur, unspecified: Secondary | ICD-10-CM | POA: Insufficient documentation

## 2013-06-18 DIAGNOSIS — J45909 Unspecified asthma, uncomplicated: Secondary | ICD-10-CM | POA: Insufficient documentation

## 2013-06-18 DIAGNOSIS — Y9389 Activity, other specified: Secondary | ICD-10-CM | POA: Insufficient documentation

## 2013-06-18 DIAGNOSIS — Z8619 Personal history of other infectious and parasitic diseases: Secondary | ICD-10-CM | POA: Insufficient documentation

## 2013-06-18 MED ORDER — ONDANSETRON HCL 4 MG PO TABS: 4.0000 mg | ORAL_TABLET | Freq: Four times a day (QID) | ORAL | Status: DC

## 2013-06-18 MED ORDER — HYDROCODONE-ACETAMINOPHEN 5-325 MG PO TABS: 1.0000 | ORAL_TABLET | ORAL | Status: DC | PRN

## 2013-06-18 MED ORDER — HYDROCODONE-ACETAMINOPHEN 5-325 MG PO TABS
1.0000 | ORAL_TABLET | Freq: Once | ORAL | Status: AC
  Administered 2013-06-18: 1 via ORAL
  Filled 2013-06-18: qty 1

## 2013-06-18 MED ORDER — MOVIPREP 100 G PO SOLR: 1.0000 | Freq: Once | ORAL | Status: DC

## 2013-06-18 NOTE — ED Provider Notes (Signed)
Medical screening examination/treatment/procedure(s) were performed by non-physician practitioner and as supervising physician I was immediately available for consultation/collaboration.   Charles B. Karle Starch, MD 06/18/13 2011
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
## 2013-06-18 NOTE — Telephone Encounter (Signed)
06/18/2013    RE: Audrey Hill DOB: Jun 24, 1956 MRN: [id number]   Dear Dr. Kennon Holter,    We have scheduled the above patient for an endoscopic procedure. Our records show that she is on anticoagulation therapy.   Please advise as to how long the patient may come off her therapy of Plavix prior to the procedure, which is scheduled for 07/02/2013.  Please fax back/ or route the completed form to [facility] at [phone].   Sincerely,  Phillis Haggis
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
## 2013-06-18 NOTE — Telephone Encounter (Signed)
  06/18/2013    RE: Lorae Ibsen DOB: 1955/11/19 MRN: [id number]   Dear Dr. Kennon Holter,    We have scheduled the above patient for an endoscopic procedure. Our records show that she is on anticoagulation therapy.   Please advise as to how long the patient may come off her therapy of Plaavix prior to the procedure, which is scheduled for 07/02/2013.  Please fax back/ or route the completed form to [facility] at [phone].   Sincerely,  Phillis Haggis

## 2013-06-18 NOTE — Patient Instructions (Addendum)
You have been given a separate informational sheet regarding your tobacco use, the importance of quitting and local resources to help you quit. You have been scheduled for a colonoscopy with propofol. Please follow written instructions given to you at your visit today.  Please pick up your prep kit at the pharmacy within the next 1-3 days. If you use inhalers (even only as needed), please bring them with you on the day of your procedure. Your physician has requested that you go to www.startemmi.com and enter the access code given to you at your visit today. This web site gives a general overview about your procedure. However, you should still follow specific instructions given to you by our office regarding your preparation for the procedure. 
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
## 2013-07-02 ENCOUNTER — Encounter: Payer: Medicare Other | Admitting: Gastroenterology

## 2013-07-17 ENCOUNTER — Encounter: Payer: Self-pay | Admitting: Gastroenterology

## 2013-07-17 ENCOUNTER — Ambulatory Visit (AMBULATORY_SURGERY_CENTER): Payer: Medicare Other | Admitting: Gastroenterology

## 2013-07-17 VITALS — BP 157/87 | HR 72 | Temp 96.4°F | Resp 28 | Ht 66.0 in | Wt 251.0 lb

## 2013-07-17 DIAGNOSIS — K648 Other hemorrhoids: Secondary | ICD-10-CM

## 2013-07-17 DIAGNOSIS — Z1211 Encounter for screening for malignant neoplasm of colon: Secondary | ICD-10-CM

## 2013-07-17 MED ORDER — SODIUM CHLORIDE 0.9 % IV SOLN: 500.0000 mL | INTRAVENOUS | Status: DC

## 2013-07-17 NOTE — Patient Instructions (Addendum)
Resume Plavix today  Discharge instructions given with verbal understanding. Handouts on hemorrhoids and a high fiber diet. Resume previous medications. YOU HAD AN ENDOSCOPIC PROCEDURE TODAY AT [facility]: Refer to the procedure report that was given to you for any specific questions about what was found during the examination.  If the procedure report does not answer your questions, please call your gastroenterologist to clarify.  If you requested that your care partner not be given the details of your procedure findings, then the procedure report has been included in a sealed envelope for you to review at your convenience later.  YOU SHOULD EXPECT: Some feelings of bloating in the abdomen. Passage of more gas than usual.  Walking can help get rid of the air that was put into your GI tract during the procedure and reduce the bloating. If you had a lower endoscopy (such as a colonoscopy or flexible sigmoidoscopy) you may notice spotting of blood in your stool or on the toilet paper. If you underwent a bowel prep for your procedure, then you may not have a normal bowel movement for a few days.  DIET: Your first meal following the procedure should be a light meal and then it is ok to progress to your normal diet.  A half-sandwich or bowl of soup is an example of a good first meal.  Heavy or fried foods are harder to digest and may make you feel nauseous or bloated.  Likewise meals heavy in dairy and vegetables can cause extra gas to form and this can also increase the bloating.  Drink plenty of fluids but you should avoid alcoholic beverages for 24 hours.  ACTIVITY: Your care partner should take you home directly after the procedure.  You should plan to take it easy, moving slowly for the rest of the day.  You can resume normal activity the day after the procedure however you should NOT DRIVE or use heavy machinery for 24 hours (because of the sedation medicines used during the test).     SYMPTOMS TO REPORT IMMEDIATELY: A gastroenterologist can be reached at any hour.  During normal business hours, 8:30 AM to 5:00 PM Monday through Friday, call [phone].  After hours and on weekends, please call the GI answering service at [phone] who will take a message and have the physician on call contact you.   Following lower endoscopy (colonoscopy or flexible sigmoidoscopy):  Excessive amounts of blood in the stool  Significant tenderness or worsening of abdominal pains  Swelling of the abdomen that is new, acute  Fever of 100F or higher  FOLLOW UP: If any biopsies were taken you will be contacted by phone or by letter within the next 1-3 weeks.  Call your gastroenterologist if you have not heard about the biopsies in 3 weeks.  Our staff will call the home number listed on your records the next business day following your procedure to check on you and address any questions or concerns that you may have at that time regarding the information given to you following your procedure. This is a courtesy call and so if there is no answer at the home number and we have not heard from you through the emergency physician on call, we will assume that you have returned to your regular daily activities without incident.  SIGNATURES/CONFIDENTIALITY: You and/or your care partner have signed paperwork which will be entered into your electronic medical record.  These signatures attest to the fact that that the  information above on your After Visit Summary has been reviewed and is understood.  Full responsibility of the confidentiality of this discharge information lies with you and/or your care-partner.
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
## 2013-07-17 NOTE — Op Note (Signed)
[facility]  [address]. [place], [id number]   COLONOSCOPY PROCEDURE REPORT  PATIENT: Audrey, Hill  MR#: [id number] BIRTHDATE: 07/14/1956 , 56  yrs. old GENDER: Female ENDOSCOPIST: Inda Castle, MD REFERRED [id number] Blount, M.D. PROCEDURE DATE:  07/17/2013 PROCEDURE:   Colonoscopy, diagnostic First Screening Colonoscopy - Avg.  risk and is 50 yrs.  old or older Yes.  Prior Negative Screening - Now for repeat screening. N/A  History of Adenoma - Now for follow-up colonoscopy & has been > or = to 3 yrs.  N/A  Polyps Removed Today? No.  Recommend repeat exam, <10 yrs? No. ASA CLASS:   Class II INDICATIONS:average risk screening. MEDICATIONS: MAC sedation, administered by CRNA and propofol (Diprivan) 300mg  IV  DESCRIPTION OF PROCEDURE:   After the risks benefits and alternatives of the procedure were thoroughly explained, informed consent was obtained.  A digital rectal exam revealed no abnormalities of the rectum.   The [id number]  endoscope was introduced through the anus and advanced to the cecum, which was identified by both the appendix and ileocecal valve. No adverse events experienced.   The quality of the prep was Suprep good  The instrument was then slowly withdrawn as the colon was fully examined.      COLON FINDINGS: Internal hemorrhoids were found.   The colon was otherwise normal.  There was no diverticulosis, inflammation, polyps or cancers unless previously stated.  Retroflexed views revealed no abnormalities. The time to cecum=2 minutes 56 seconds. Withdrawal time=7 minutes 22 seconds.  The scope was withdrawn and the procedure completed. COMPLICATIONS: There were no complications.  ENDOSCOPIC IMPRESSION: 1.   Internal hemorrhoids 2.   The colon was otherwise normal  RECOMMENDATIONS: Continue current colorectal screening recommendations for "routine risk" patients with a repeat colonoscopy in 10 years.   eSigned:   Inda Castle, MD 07/17/2013 3:20 PM   cc:   PATIENT NAME:  Audrey, Hill MR#: [id number]

## 2013-07-18 ENCOUNTER — Telehealth: Payer: Self-pay | Admitting: *Deleted

## 2013-07-18 NOTE — Telephone Encounter (Signed)
No answer, left message to call if questions or concerns. 

## 2013-09-29 ENCOUNTER — Emergency Department (HOSPITAL_COMMUNITY): Payer: Medicare Other

## 2013-09-29 ENCOUNTER — Inpatient Hospital Stay (HOSPITAL_COMMUNITY)
Admission: EM | Admit: 2013-09-29 | Discharge: 2013-10-01 | DRG: 202 | Disposition: A | Discharge status: Home / Self Care | Payer: Medicare Other | Attending: Internal Medicine | Admitting: Internal Medicine

## 2013-09-29 ENCOUNTER — Encounter (HOSPITAL_COMMUNITY): Payer: Self-pay | Admitting: Emergency Medicine

## 2013-09-29 DIAGNOSIS — K759 Inflammatory liver disease, unspecified: Secondary | ICD-10-CM

## 2013-09-29 DIAGNOSIS — I10 Essential (primary) hypertension: Secondary | ICD-10-CM | POA: Diagnosis present

## 2013-09-29 DIAGNOSIS — Z79899 Other long term (current) drug therapy: Secondary | ICD-10-CM

## 2013-09-29 DIAGNOSIS — R509 Fever, unspecified: Secondary | ICD-10-CM | POA: Diagnosis present

## 2013-09-29 DIAGNOSIS — K219 Gastro-esophageal reflux disease without esophagitis: Secondary | ICD-10-CM | POA: Diagnosis present

## 2013-09-29 DIAGNOSIS — Z7902 Long term (current) use of antithrombotics/antiplatelets: Secondary | ICD-10-CM

## 2013-09-29 DIAGNOSIS — J45901 Unspecified asthma with (acute) exacerbation: Principal | ICD-10-CM | POA: Diagnosis present

## 2013-09-29 DIAGNOSIS — E785 Hyperlipidemia, unspecified: Secondary | ICD-10-CM | POA: Diagnosis present

## 2013-09-29 DIAGNOSIS — J111 Influenza due to unidentified influenza virus with other respiratory manifestations: Secondary | ICD-10-CM | POA: Diagnosis present

## 2013-09-29 DIAGNOSIS — R059 Cough, unspecified: Secondary | ICD-10-CM | POA: Diagnosis present

## 2013-09-29 DIAGNOSIS — R5381 Other malaise: Secondary | ICD-10-CM | POA: Diagnosis present

## 2013-09-29 DIAGNOSIS — R531 Weakness: Secondary | ICD-10-CM

## 2013-09-29 DIAGNOSIS — M129 Arthropathy, unspecified: Secondary | ICD-10-CM | POA: Diagnosis present

## 2013-09-29 DIAGNOSIS — F17200 Nicotine dependence, unspecified, uncomplicated: Secondary | ICD-10-CM | POA: Diagnosis present

## 2013-09-29 DIAGNOSIS — R569 Unspecified convulsions: Secondary | ICD-10-CM | POA: Diagnosis present

## 2013-09-29 DIAGNOSIS — R05 Cough: Secondary | ICD-10-CM | POA: Diagnosis present

## 2013-09-29 DIAGNOSIS — J189 Pneumonia, unspecified organism: Secondary | ICD-10-CM | POA: Diagnosis present

## 2013-09-29 DIAGNOSIS — J45909 Unspecified asthma, uncomplicated: Secondary | ICD-10-CM | POA: Diagnosis present

## 2013-09-29 DIAGNOSIS — R6889 Other general symptoms and signs: Secondary | ICD-10-CM

## 2013-09-29 DIAGNOSIS — Z96659 Presence of unspecified artificial knee joint: Secondary | ICD-10-CM

## 2013-09-29 DIAGNOSIS — F419 Anxiety disorder, unspecified: Secondary | ICD-10-CM | POA: Diagnosis present

## 2013-09-29 DIAGNOSIS — Z8673 Personal history of transient ischemic attack (TIA), and cerebral infarction without residual deficits: Secondary | ICD-10-CM

## 2013-09-29 DIAGNOSIS — S68118A Complete traumatic metacarpophalangeal amputation of other finger, initial encounter: Secondary | ICD-10-CM

## 2013-09-29 LAB — POCT I-STAT TROPONIN I: Troponin i, poc: 0.01 ng/mL (ref 0.00–0.08)

## 2013-09-29 LAB — CBC WITH DIFFERENTIAL/PLATELET
Basophils Absolute: 0 10*3/uL (ref 0.0–0.1)
Basophils Relative: 0 % (ref 0–1)
HCT: 36.3 % (ref 36.0–46.0)
Lymphocytes Relative: 30 % (ref 12–46)
Lymphs Abs: 2 10*3/uL (ref 0.7–4.0)
MCHC: 34.2 g/dL (ref 30.0–36.0)
Monocytes Relative: 4 % (ref 3–12)
Neutro Abs: 4.5 10*3/uL (ref 1.7–7.7)
Neutrophils Relative %: 66 % (ref 43–77)
RBC: 4.56 MIL/uL (ref 3.87–5.11)
RDW: 14.8 % (ref 11.5–15.5)
WBC: 6.8 10*3/uL (ref 4.0–10.5)

## 2013-09-29 LAB — POCT I-STAT, CHEM 8
BUN: 13 mg/dL (ref 6–23)
Chloride: 108 mEq/L (ref 96–112)
Creatinine, Ser: 1.1 mg/dL (ref 0.50–1.10)
HCT: 40 % (ref 36.0–46.0)
Potassium: 3.1 mEq/L — ABNORMAL LOW (ref 3.5–5.1)
Sodium: 146 mEq/L — ABNORMAL HIGH (ref 135–145)
TCO2: 24 mmol/L (ref 0–100)

## 2013-09-29 MED ORDER — TEMAZEPAM 15 MG PO CAPS
15.0000 mg | ORAL_CAPSULE | Freq: Every evening | ORAL | Status: DC | PRN
  Administered 2013-09-30 (×2): 15 mg via ORAL
  Filled 2013-09-29 (×2): qty 1

## 2013-09-29 MED ORDER — MORPHINE SULFATE 4 MG/ML IJ SOLN
4.0000 mg | Freq: Once | INTRAMUSCULAR | Status: AC
  Administered 2013-09-29: 4 mg via INTRAVENOUS
  Filled 2013-09-29: qty 1

## 2013-09-29 MED ORDER — CLOPIDOGREL BISULFATE 75 MG PO TABS
75.0000 mg | ORAL_TABLET | Freq: Every day | ORAL | Status: DC
  Administered 2013-09-30 – 2013-10-01 (×2): 75 mg via ORAL
  Filled 2013-09-29 (×2): qty 1

## 2013-09-29 MED ORDER — ALBUTEROL (5 MG/ML) CONTINUOUS INHALATION SOLN
10.0000 mg/h | INHALATION_SOLUTION | Freq: Once | RESPIRATORY_TRACT | Status: AC
  Administered 2013-09-29: 10 mg/h via RESPIRATORY_TRACT
  Filled 2013-09-29: qty 40

## 2013-09-29 MED ORDER — METHYLPREDNISOLONE SODIUM SUCC 125 MG IJ SOLR
60.0000 mg | Freq: Two times a day (BID) | INTRAMUSCULAR | Status: DC
  Administered 2013-09-30: 60 mg via INTRAVENOUS
  Filled 2013-09-29 (×3): qty 0.96

## 2013-09-29 MED ORDER — HYDROCODONE-ACETAMINOPHEN 5-325 MG PO TABS
1.0000 | ORAL_TABLET | Freq: Four times a day (QID) | ORAL | Status: DC | PRN
  Administered 2013-09-30 – 2013-10-01 (×5): 2 via ORAL
  Filled 2013-09-29 (×5): qty 2

## 2013-09-29 MED ORDER — TEMAZEPAM 7.5 MG PO CAPS: 7.5000 mg | ORAL_CAPSULE | Freq: Every evening | ORAL | Status: DC | PRN

## 2013-09-29 MED ORDER — ONDANSETRON HCL 4 MG/2ML IJ SOLN
4.0000 mg | Freq: Once | INTRAMUSCULAR | Status: AC
  Administered 2013-09-29: 4 mg via INTRAVENOUS
  Filled 2013-09-29: qty 2

## 2013-09-29 MED ORDER — LOSARTAN POTASSIUM 25 MG PO TABS
25.0000 mg | ORAL_TABLET | Freq: Every day | ORAL | Status: DC
  Administered 2013-09-30 – 2013-10-01 (×2): 25 mg via ORAL
  Filled 2013-09-29 (×2): qty 1

## 2013-09-29 MED ORDER — ALBUTEROL SULFATE (5 MG/ML) 0.5% IN NEBU
5.0000 mg | INHALATION_SOLUTION | Freq: Once | RESPIRATORY_TRACT | Status: AC
  Administered 2013-09-29: 5 mg via RESPIRATORY_TRACT
  Filled 2013-09-29: qty 1

## 2013-09-29 MED ORDER — HEPARIN SODIUM (PORCINE) 5000 UNIT/ML IJ SOLN
5000.0000 [IU] | Freq: Three times a day (TID) | INTRAMUSCULAR | Status: DC
  Administered 2013-09-30: 5000 [IU] via SUBCUTANEOUS
  Filled 2013-09-29 (×4): qty 1

## 2013-09-29 MED ORDER — SIMVASTATIN 20 MG PO TABS
20.0000 mg | ORAL_TABLET | Freq: Every day | ORAL | Status: DC
  Administered 2013-09-30: 20 mg via ORAL
  Filled 2013-09-29 (×2): qty 1

## 2013-09-29 MED ORDER — AMLODIPINE BESYLATE 10 MG PO TABS
10.0000 mg | ORAL_TABLET | Freq: Every day | ORAL | Status: DC
  Administered 2013-09-30 – 2013-10-01 (×2): 10 mg via ORAL
  Filled 2013-09-29 (×2): qty 1

## 2013-09-29 MED ORDER — IPRATROPIUM BROMIDE 0.02 % IN SOLN
0.5000 mg | Freq: Once | RESPIRATORY_TRACT | Status: AC
  Administered 2013-09-29: 0.5 mg via RESPIRATORY_TRACT
  Filled 2013-09-29: qty 2.5

## 2013-09-29 MED ORDER — GABAPENTIN 300 MG PO CAPS
300.0000 mg | ORAL_CAPSULE | Freq: Three times a day (TID) | ORAL | Status: DC | PRN
  Administered 2013-09-30: 300 mg via ORAL
  Filled 2013-09-29 (×2): qty 1

## 2013-09-29 MED ORDER — OSELTAMIVIR PHOSPHATE 75 MG PO CAPS
75.0000 mg | ORAL_CAPSULE | Freq: Two times a day (BID) | ORAL | Status: DC
  Administered 2013-09-30 (×2): 75 mg via ORAL
  Filled 2013-09-29 (×3): qty 1

## 2013-09-29 MED ORDER — POTASSIUM CHLORIDE CRYS ER 20 MEQ PO TBCR
40.0000 meq | EXTENDED_RELEASE_TABLET | Freq: Once | ORAL | Status: AC
  Administered 2013-09-29: 40 meq via ORAL
  Filled 2013-09-29: qty 2

## 2013-09-29 NOTE — ED Notes (Signed)
Pt states feeling bad for two days,  Pt has pain behind left ear,  Pt is tearful  ,  Expiratory wheezing,  Feels warm to touch,  But afebrile

## 2013-09-29 NOTE — ED Notes (Signed)
While ambulating pt O2 was 86%.  Pt remained with unsteady gait.  Upheld by with 2 assist.  Audible wheezing
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
## 2013-09-29 NOTE — ED Notes (Signed)
Pt states she still feels horrible,  Pt is on cell phone upon entering room,  Pt begins to moan and rock back and forth,  Pt advised of admission

## 2013-09-29 NOTE — ED Notes (Signed)
She c/o cough/various aches x 3 days.  She has had Albuterol/Atrovent h.h.n. En route; also, she received IV Solu Medrol en route to hospital.
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
## 2013-09-29 NOTE — ED Notes (Signed)
Pt was not able to ambulate.   Pt did not have a steady gait.  Pt was not able to verbally communicate.   Pt could not focus while walking. Had to hold pt up to get back to bed.

## 2013-09-30 ENCOUNTER — Encounter (HOSPITAL_COMMUNITY): Payer: Self-pay

## 2013-09-30 DIAGNOSIS — R5381 Other malaise: Secondary | ICD-10-CM

## 2013-09-30 DIAGNOSIS — K219 Gastro-esophageal reflux disease without esophagitis: Secondary | ICD-10-CM

## 2013-09-30 LAB — INFLUENZA PANEL BY PCR (TYPE A & B)
H1N1 flu by pcr: NOT DETECTED
Influenza A By PCR: NEGATIVE
Influenza B By PCR: NEGATIVE

## 2013-09-30 MED ORDER — HYDROMORPHONE HCL PF 1 MG/ML IJ SOLN
0.5000 mg | Freq: Four times a day (QID) | INTRAMUSCULAR | Status: DC | PRN
  Administered 2013-09-30 – 2013-10-01 (×4): 0.5 mg via INTRAVENOUS
  Filled 2013-09-30 (×4): qty 1

## 2013-09-30 MED ORDER — LEVOFLOXACIN IN D5W 750 MG/150ML IV SOLN
750.0000 mg | INTRAVENOUS | Status: DC
  Administered 2013-09-30: 750 mg via INTRAVENOUS
  Filled 2013-09-30 (×2): qty 150

## 2013-09-30 MED ORDER — OXYCODONE HCL 5 MG PO TABS
5.0000 mg | ORAL_TABLET | Freq: Once | ORAL | Status: AC
  Administered 2013-09-30: 5 mg via ORAL
  Filled 2013-09-30: qty 1

## 2013-09-30 MED ORDER — ALBUTEROL SULFATE (2.5 MG/3ML) 0.083% IN NEBU
5.0000 mg | INHALATION_SOLUTION | RESPIRATORY_TRACT | Status: DC
  Administered 2013-09-30: 5 mg via RESPIRATORY_TRACT
  Administered 2013-09-30: 16:00:00 via RESPIRATORY_TRACT
  Administered 2013-10-01 (×4): 5 mg via RESPIRATORY_TRACT
  Filled 2013-09-30 (×2): qty 6
  Filled 2013-09-30: qty 1
  Filled 2013-09-30 (×2): qty 6
  Filled 2013-09-30: qty 1
  Filled 2013-09-30: qty 6
  Filled 2013-09-30 (×2): qty 1

## 2013-09-30 MED ORDER — METHYLPREDNISOLONE SODIUM SUCC 125 MG IJ SOLR
60.0000 mg | INTRAMUSCULAR | Status: DC
  Administered 2013-10-01: 60 mg via INTRAVENOUS
  Filled 2013-09-30 (×2): qty 0.96

## 2013-09-30 MED ORDER — IPRATROPIUM BROMIDE 0.02 % IN SOLN
0.5000 mg | RESPIRATORY_TRACT | Status: DC
  Administered 2013-09-30 – 2013-10-01 (×6): 0.5 mg via RESPIRATORY_TRACT
  Filled 2013-09-30 (×6): qty 2.5

## 2013-09-30 NOTE — Progress Notes (Signed)
[facility] PROGRESS NOTE  Audrey Hill [id number] DOB: 03-15-1956 DOA: 09/29/2013 PCP: Elizabeth Palau, MD  Brief narrative: 57 y.o. female with past medical history of asthma, GERD who presented to [facility] ED 09/29/2013 with fever, cough, generalzied malaise and congestion for past 1-2 days prior to this admission. Pt was started empirically on tamiflu but her influenza PCR was negative. Her oxygen saturation was 94% on room air on admission and T max was 73F. She was given nebulizer treatments in ED but continued to feel short of breath and had wheezing.   Assessment/Plan:  Principal Problem:   Influenza-like illness - Influenza PCR negative - D/C tamiflu - continue supportive care with IV fluids, antiemetics Active Problems:   Community acquired pneumonia - has low grade fever, productive cough and h/o asthma - will treat with Levaquin for total of 7 days   Acute asthma exacerbation - continue bronchodilator treatments - continue solumedrol once a day 60 mg IV - oxygen support via nasal canula to keep O2 saturation above 90%   Hypertension - continue losartan and norvasc   Hyperlipidemia - continue statin therapy   H/O Stroke - on plavix  Code Status: full code Family Communication: no family at the bedside Disposition Plan: home when stable   Leisa Lenz, MD  [facility] Pager [phone]  If 7PM-7AM, please contact night-coverage [email or web address] Password [email or web address] 09/30/2013, 7:18 AM   LOS: 1 day   Consultants:  None   Procedures:  None   Antibiotics:  Tamiflu 09/29/2013 --> 09/30/2013   HPI/Subjective: No acute overnight events.  Objective: Filed Vitals:   09/29/13 1949 09/30/13 0014 09/30/13 0059 09/30/13 0551  BP: 149/80 135/67 133/65 123/58  Pulse: 90 93 85 82  Temp: 99 F (37.2 C) 98.3 F (36.8 C) 98.3 F (36.8 C) 98.3 F (36.8 C)  TempSrc: Oral Oral Oral Oral  Resp: 21 21 28 16   Height:   5\' 6"  (1.676 m)   Weight:    102.2 kg (225 lb 5 oz)   SpO2: 95% 94% 98% 97%   No intake or output data in the 24 hours ending 09/30/13 0718  Exam:   General:  Pt is alert, follows commands appropriately, not in acute distress  Cardiovascular: Regular rate and rhythm, S1/S2, no murmurs, no rubs, no gallops  Respiratory: inspiratory crackles, coarse breath sounds, wheezing in upper lung lobes   Abdomen: Soft, non tender, non distended, bowel sounds present, no guarding  Extremities: No edema, pulses DP and PT palpable bilaterally  Neuro: Grossly nonfocal  Data Reviewed: Basic Metabolic Panel:  Recent Labs Lab 09/29/13 1954  NA 146*  K 3.1*  CL 108  GLUCOSE 110*  BUN 13  CREATININE 1.10   Liver Function Tests: No results found for this basename: AST, ALT, ALKPHOS, BILITOT, PROT, ALBUMIN,  in the last 168 hours No results found for this basename: LIPASE, AMYLASE,  in the last 168 hours No results found for this basename: AMMONIA,  in the last 168 hours CBC:  Recent Labs Lab 09/29/13 1930 09/29/13 1954  WBC 6.8  --   NEUTROABS 4.5  --   HGB 12.4 13.6  HCT 36.3 40.0  MCV 79.6  --   PLT 196  --    Cardiac Enzymes: No results found for this basename: CKTOTAL, CKMB, CKMBINDEX, TROPONINI,  in the last 168 hours BNP: No components found with this basename: POCBNP,  CBG: No results found for this basename: GLUCAP,  in the last 168 hours  No results found  for this or any previous visit (from the past 240 hour(s)).   Studies: Dg Chest 2 View 09/29/2013    IMPRESSION: No acute cardiopulmonary disease.   Electronically Signed   By: Lajean Manes M.D.   On: 09/29/2013 19:52    Scheduled Meds: . amLODipine  10 mg Oral Daily  . clopidogrel  75 mg Oral Daily  . losartan  25 mg Oral Daily  . methylPREDNISolone  60 mg Intravenous Q24H  . simvastatin  20 mg Oral q1800

## 2013-09-30 NOTE — ED Provider Notes (Signed)
Medical screening examination/treatment/procedure(s) were performed by non-physician practitioner and as supervising physician I was immediately available for consultation/collaboration.  EKG Interpretation   None         Blanchard Kelch, MD 09/30/13 [phone]

## 2013-10-01 LAB — BASIC METABOLIC PANEL
BUN: 16 mg/dL (ref 6–23)
CO2: 25 mEq/L (ref 19–32)
Calcium: 8.8 mg/dL (ref 8.4–10.5)
Chloride: 107 mEq/L (ref 96–112)
Creatinine, Ser: 0.94 mg/dL (ref 0.50–1.10)
GFR calc Af Amer: 75 mL/min — ABNORMAL LOW (ref >90)
GFR calc non Af Amer: 65 mL/min — ABNORMAL LOW (ref >90)
Glucose, Bld: 130 mg/dL — ABNORMAL HIGH (ref 70–99)
Potassium: 3.5 mEq/L (ref 3.5–5.1)

## 2013-10-01 LAB — CBC
HCT: 33.4 % — ABNORMAL LOW (ref 36.0–46.0)
Hemoglobin: 11.1 g/dL — ABNORMAL LOW (ref 12.0–15.0)
Platelets: 197 10*3/uL (ref 150–400)
RBC: 4.2 MIL/uL (ref 3.87–5.11)
WBC: 8.4 10*3/uL (ref 4.0–10.5)

## 2013-10-01 MED ORDER — GUAIFENESIN 100 MG/5ML PO SOLN: 200.0000 mg | ORAL | Status: DC | PRN

## 2013-10-01 MED ORDER — ALBUTEROL SULFATE HFA 108 (90 BASE) MCG/ACT IN AERS: 2.0000 | INHALATION_SPRAY | Freq: Four times a day (QID) | RESPIRATORY_TRACT | Status: DC | PRN

## 2013-10-01 MED ORDER — PREDNISONE 50 MG PO TABS: ORAL_TABLET | ORAL | Status: DC

## 2013-10-01 MED ORDER — DOCUSATE SODIUM 100 MG PO CAPS
100.0000 mg | ORAL_CAPSULE | Freq: Two times a day (BID) | ORAL | Status: DC
  Administered 2013-10-01: 100 mg via ORAL
  Filled 2013-10-01 (×2): qty 1

## 2013-10-01 MED ORDER — HYDROCODONE-ACETAMINOPHEN 5-325 MG PO TABS: 1.0000 | ORAL_TABLET | Freq: Four times a day (QID) | ORAL | Status: DC | PRN

## 2013-10-01 MED ORDER — DM-GUAIFENESIN ER 30-600 MG PO TB12
1.0000 | ORAL_TABLET | Freq: Two times a day (BID) | ORAL | Status: DC
  Administered 2013-10-01: 1 via ORAL
  Filled 2013-10-01 (×2): qty 1

## 2013-10-01 MED ORDER — DSS 100 MG PO CAPS: 100.0000 mg | ORAL_CAPSULE | Freq: Two times a day (BID) | ORAL | Status: DC

## 2013-10-01 MED ORDER — DM-GUAIFENESIN ER 30-600 MG PO TB12: 1.0000 | ORAL_TABLET | Freq: Two times a day (BID) | ORAL | Status: DC | PRN

## 2013-10-01 NOTE — Care Management Note (Signed)
   CARE MANAGEMENT NOTE 10/01/2013  Patient:  KELCI, SCHOENBACHLER   Account Number:  [id number]  Date Initiated:  10/01/2013  Documentation initiated by:  Blayn Whetsell  Subjective/Objective Assessment:   57 yo female admitted with Influenza-like illness and CAP.PCP: Elizabeth Palau, MD     Action/Plan:   Home when stable   Anticipated DC Date:  10/01/2013   Anticipated DC Plan:  [place]  CM consult      Choice offered to / List presented to:  NA   DME arranged  NA      DME agency  NA     [place] arranged  NA      [place] agency  NA   Status of service:  Completed, signed off Medicare Important Message given?   (If response is "NO", the following Medicare IM given date fields will be blank) Date Medicare IM given:   Date Additional Medicare IM given:    Discharge Disposition:    Per UR Regulation:  Reviewed for med. necessity/level of care/duration of stay  If discussed at [place] of Stay Meetings, dates discussed:    Comments:  10/01/13 1333 Diva Lemberger,MSN,RN [id number] Chart reviewed for utilization of services. Identified PCP and pharmacy. No needs identified.

## 2013-10-01 NOTE — Discharge Summary (Signed)
Physician Discharge Summary  Audrey Hill [id number] DOB: October 15, 1955 DOA: 09/29/2013  PCP: Elizabeth Palau, MD  Admit date: 09/29/2013 Discharge date: 10/01/2013  Recommendations for Outpatient Follow-up:  1. Please note that your test was negative for influenza 2. You do not need antibiotics at the time of discharge as your chest x ray did not reveal pneumonia 3. Please get rest until your viral illness resolves 4. Take prednisone for 5 days on discharge, 50 mg a day and then stop. This is for asthma exacerbation 5. Take Proventil inhaler as needed every 4-6 hours for shortness of breath 6. Follow up with PCP in 1-2 weeks after discharge to make sure symptoms are controlled   Discharge Diagnoses:  Principal Problem:   Influenza-like illness Active Problems:   Hypertension   Hyperlipidemia   GERD (gastroesophageal reflux disease)    Discharge Condition: medically stable for discharge home today  Diet recommendation: as tolerated  History of present illness:  57 y.o. female with past medical history of asthma, GERD who presented to [facility] ED 09/29/2013 with fever, cough, generalzied malaise and congestion for past 1-2 days prior to this admission. Pt was started empirically on tamiflu but her influenza PCR was negative. Her oxygen saturation was 94% on room air on admission and T max was 58F. She was given nebulizer treatments in ED but continued to feel short of breath and had wheezing.   Assessment/Plan:   Principal Problem:  Influenza-like illness  - Influenza PCR negative  - D/C'ed tamiflu   Active Problems:  Community acquired pneumonia  - we gave 1 dose of Levaquin but no findings on CXR for pneumonia so will d/c Levaquin today  Acute asthma exacerbation  - given bronchodilator treatments in hospital; will prescribe Proventil on discharge - solumedrol in hospital; prednisone for 5 days on discharge  Hypertension  - continue losartan and norvasc   Hyperlipidemia  - continue statin therapy  H/O Stroke  - on plavix   Code Status: full code  Family Communication: no family at the bedside  Disposition Plan: home today  Consultants:  None  Procedures:  None  Antibiotics:  Tamiflu 09/29/2013 --> 09/30/2013     Signed:  Leisa Lenz, MD  [facility] 10/01/2013, 10:32 AM  Pager #: [phone]    Discharge Exam: Filed Vitals:   10/01/13 0510  BP: 131/67  Pulse: 84  Temp: 98.2 F (36.8 C)  Resp: 20   Filed Vitals:   09/30/13 2035 10/01/13 0403 10/01/13 0510 10/01/13 0804  BP: 129/71  131/67   Pulse: 82  84   Temp: 98.2 F (36.8 C)  98.2 F (36.8 C)   TempSrc: Oral  Oral   Resp: 18  20   Height:      Weight:      SpO2: 99% 98% 98% 93%    General: Pt is alert, follows commands appropriately, not in acute distress Cardiovascular: Regular rate and rhythm, S1/S2 +, no murmurs, no rubs, no gallops Respiratory: wheezing in upper lung lobes Abdominal: Soft, non tender, non distended, bowel sounds +, no guarding Extremities: no edema, no cyanosis, pulses palpable bilaterally DP and PT Neuro: Grossly nonfocal  Discharge Instructions  Discharge Orders   Future Appointments Provider Department Dept Phone   11/28/2013 11:00 AM [facility] [place] [phone]   11/28/2013 12:00 PM Sharmon Leyden Nickel, NP [facility] [place] [phone]   Future Orders Complete By Expires   Call MD for:  difficulty breathing, headache or  visual disturbances  As directed    Call MD for:  persistant dizziness or light-headedness  As directed    Call MD for:  persistant nausea and vomiting  As directed    Call MD for:  severe uncontrolled pain  As directed    Diet - low sodium heart healthy  As directed    Discharge instructions  As directed    Comments:     1. Please note that your test was negative for influenza 2. You do not need antibiotics at the time of discharge  as your chest x ray did not reveal pneumonia 3. Please get rest until your viral illness resolves 4. Take prednisone for 5 days on discharge, 50 mg a day and then stop. This is for asthma exacerbation 5. Take Proventil inhaler as needed every 4-6 hours for shortness of breath 6. Follow up with PCP in 1-2 weeks after discharge to make sure symptoms are controlled.   Increase activity slowly  As directed        Medication List         albuterol 108 (90 BASE) MCG/ACT inhaler  Commonly known as:  PROVENTIL HFA;VENTOLIN HFA  Inhale 2 puffs into the lungs every 6 (six) hours as needed for wheezing or shortness of breath.     amLODipine 10 MG tablet  Commonly known as:  NORVASC  Take 10 mg by mouth daily.     clopidogrel 75 MG tablet  Commonly known as:  PLAVIX  Take 75 mg by mouth daily.     dextromethorphan-guaiFENesin 30-600 MG per 12 hr tablet  Commonly known as:  MUCINEX DM  Take 1 tablet by mouth 2 (two) times daily as needed for cough.     DSS 100 MG Caps  Take 100 mg by mouth 2 (two) times daily.     gabapentin 300 MG capsule  Commonly known as:  NEURONTIN  Take 300 mg by mouth 3 (three) times daily as needed (pain).     HYDROcodone-acetaminophen 5-325 MG per tablet  Commonly known as:  NORCO/VICODIN  Take 1-2 tablets by mouth every 6 (six) hours as needed for moderate pain.     losartan 25 MG tablet  Commonly known as:  COZAAR  Take 25 mg by mouth daily.     pravastatin 40 MG tablet  Commonly known as:  PRAVACHOL  Take 40 mg by mouth at bedtime.     predniSONE 50 MG tablet  Commonly known as:  DELTASONE  Take 50 mg a day for 5 days and then stop     temazepam 7.5 MG capsule  Commonly known as:  RESTORIL  Take 7.5 mg by mouth at bedtime as needed for sleep.           Follow-up Information   Follow up with Elizabeth Palau, MD. Schedule an appointment as soon as possible for a visit in 2 weeks.   Specialty:  Family Medicine   Contact information:    [place] [address] [phone]        The results of significant diagnostics from this hospitalization (including imaging, microbiology, ancillary and laboratory) are listed below for reference.    Significant Diagnostic Studies: Dg Chest 2 View  09/29/2013   CLINICAL DATA:  Cough.  Aches.  EXAM: CHEST  2 VIEW  COMPARISON:  08/07/2013  FINDINGS: Lung volumes are low. Allowing for this, lungs are clear. No pleural effusion or pneumothorax.  The cardiac silhouette is  mildly enlarged. Mediastinum is normal in contour.  The bony thorax is intact.  IMPRESSION: No acute cardiopulmonary disease.   Electronically Signed   By: Lajean Manes M.D.   On: 09/29/2013 19:52    Microbiology: No results found for this or any previous visit (from the past 240 hour(s)).   Labs: Basic Metabolic Panel:  Recent Labs Lab 09/29/13 1954 10/01/13 0423  NA 146* 140  K 3.1* 3.5  CL 108 107  CO2  --  25  GLUCOSE 110* 130*  BUN 13 16  CREATININE 1.10 0.94  CALCIUM  --  8.8   Liver Function Tests: No results found for this basename: AST, ALT, ALKPHOS, BILITOT, PROT, ALBUMIN,  in the last 168 hours No results found for this basename: LIPASE, AMYLASE,  in the last 168 hours No results found for this basename: AMMONIA,  in the last 168 hours CBC:  Recent Labs Lab 09/29/13 1930 09/29/13 1954 10/01/13 0423  WBC 6.8  --  8.4  NEUTROABS 4.5  --   --   HGB 12.4 13.6 11.1*  HCT 36.3 40.0 33.4*  MCV 79.6  --  79.5  PLT 196  --  197   Cardiac Enzymes: No results found for this basename: CKTOTAL, CKMB, CKMBINDEX, TROPONINI,  in the last 168 hours BNP: BNP (last 3 results) No results found for this basename: PROBNP,  in the last 8760 hours CBG: No results found for this basename: GLUCAP,  in the last 168 hours  Time coordinating discharge: Over 30 minutes

## 2013-10-01 NOTE — Progress Notes (Signed)
Nutrition Brief Note  Patient identified on the Malnutrition Screening Tool (MST) Report  Wt Readings from Last 15 Encounters:  09/30/13 225 lb 5 oz (102.2 kg)  07/17/13 251 lb (113.853 kg)  06/18/13 237 lb 6.4 oz (107.684 kg)  02/14/13 246 lb (111.585 kg)  11/22/12 243 lb (110.224 kg)  11/10/12 251 lb 9.6 oz (114.125 kg)  10/09/12 247 lb 6.4 oz (112.22 kg)  09/19/12 247 lb 9.6 oz (112.311 kg)  08/30/12 248 lb 3.2 oz (112.583 kg)  11/24/11 241 lb (109.317 kg)  09/15/11 230 lb (104.327 kg)  09/15/11 230 lb (104.327 kg)  08/24/11 222 lb 0.1 oz (100.7 kg)    Body mass index is 36.38 kg/(m^2). Patient meets criteria for Obesity based on current BMI. Pt asleep at time of visit. Weight history shows 21 lb weight loss since May 2014- 9% weight loss is not significant.  Current diet order is Regular, patient is consuming approximately 100% of meals at this time. Labs and medications reviewed.   No nutrition interventions warranted at this time. If nutrition issues arise, please consult RD.   Pryor Ochoa RD, LDN Inpatient Clinical Dietitian Pager: [phone] After Hours Pager: [phone]'

## 2013-11-27 ENCOUNTER — Encounter: Payer: Self-pay | Admitting: Family

## 2013-11-28 ENCOUNTER — Other Ambulatory Visit (HOSPITAL_COMMUNITY): Payer: Medicare Other

## 2013-11-28 ENCOUNTER — Ambulatory Visit: Payer: Medicare Other | Admitting: Family

## 2013-12-26 ENCOUNTER — Ambulatory Visit: Payer: Medicare Other | Admitting: Family

## 2013-12-26 ENCOUNTER — Other Ambulatory Visit (HOSPITAL_COMMUNITY): Payer: Medicare Other

## 2014-02-12 ENCOUNTER — Ambulatory Visit: Payer: Medicare Other | Attending: Anesthesiology | Admitting: Rehabilitation

## 2014-02-12 DIAGNOSIS — M545 Low back pain, unspecified: Secondary | ICD-10-CM | POA: Diagnosis not present

## 2014-02-12 DIAGNOSIS — I10 Essential (primary) hypertension: Secondary | ICD-10-CM | POA: Diagnosis not present

## 2014-02-12 DIAGNOSIS — Z9889 Other specified postprocedural states: Secondary | ICD-10-CM | POA: Diagnosis not present

## 2014-02-12 DIAGNOSIS — IMO0001 Reserved for inherently not codable concepts without codable children: Secondary | ICD-10-CM | POA: Diagnosis not present

## 2014-02-20 ENCOUNTER — Ambulatory Visit: Payer: Medicare Other | Admitting: Rehabilitation

## 2014-02-20 DIAGNOSIS — IMO0001 Reserved for inherently not codable concepts without codable children: Secondary | ICD-10-CM | POA: Diagnosis not present

## 2014-03-18 ENCOUNTER — Emergency Department (HOSPITAL_BASED_OUTPATIENT_CLINIC_OR_DEPARTMENT_OTHER)
Admission: EM | Admit: 2014-03-18 | Discharge: 2014-03-18 | Disposition: A | Discharge status: Home / Self Care | Payer: Medicare Other | Attending: Emergency Medicine | Admitting: Emergency Medicine

## 2014-03-18 ENCOUNTER — Emergency Department (HOSPITAL_BASED_OUTPATIENT_CLINIC_OR_DEPARTMENT_OTHER): Payer: Medicare Other

## 2014-03-18 DIAGNOSIS — F411 Generalized anxiety disorder: Secondary | ICD-10-CM | POA: Insufficient documentation

## 2014-03-18 DIAGNOSIS — E785 Hyperlipidemia, unspecified: Secondary | ICD-10-CM | POA: Insufficient documentation

## 2014-03-18 DIAGNOSIS — Z8719 Personal history of other diseases of the digestive system: Secondary | ICD-10-CM | POA: Insufficient documentation

## 2014-03-18 DIAGNOSIS — R079 Chest pain, unspecified: Secondary | ICD-10-CM | POA: Insufficient documentation

## 2014-03-18 DIAGNOSIS — I10 Essential (primary) hypertension: Secondary | ICD-10-CM | POA: Insufficient documentation

## 2014-03-18 DIAGNOSIS — F17200 Nicotine dependence, unspecified, uncomplicated: Secondary | ICD-10-CM | POA: Insufficient documentation

## 2014-03-18 DIAGNOSIS — R51 Headache: Secondary | ICD-10-CM

## 2014-03-18 DIAGNOSIS — Z7902 Long term (current) use of antithrombotics/antiplatelets: Secondary | ICD-10-CM | POA: Insufficient documentation

## 2014-03-18 DIAGNOSIS — J45909 Unspecified asthma, uncomplicated: Secondary | ICD-10-CM | POA: Insufficient documentation

## 2014-03-18 DIAGNOSIS — M129 Arthropathy, unspecified: Secondary | ICD-10-CM | POA: Insufficient documentation

## 2014-03-18 DIAGNOSIS — Z8673 Personal history of transient ischemic attack (TIA), and cerebral infarction without residual deficits: Secondary | ICD-10-CM | POA: Insufficient documentation

## 2014-03-18 DIAGNOSIS — G43909 Migraine, unspecified, not intractable, without status migrainosus: Secondary | ICD-10-CM | POA: Insufficient documentation

## 2014-03-18 DIAGNOSIS — I209 Angina pectoris, unspecified: Secondary | ICD-10-CM | POA: Insufficient documentation

## 2014-03-18 DIAGNOSIS — Z79899 Other long term (current) drug therapy: Secondary | ICD-10-CM | POA: Insufficient documentation

## 2014-03-18 DIAGNOSIS — Z87828 Personal history of other (healed) physical injury and trauma: Secondary | ICD-10-CM | POA: Insufficient documentation

## 2014-03-18 DIAGNOSIS — R011 Cardiac murmur, unspecified: Secondary | ICD-10-CM | POA: Insufficient documentation

## 2014-03-18 DIAGNOSIS — R519 Headache, unspecified: Secondary | ICD-10-CM

## 2014-03-18 DIAGNOSIS — Z8669 Personal history of other diseases of the nervous system and sense organs: Secondary | ICD-10-CM | POA: Insufficient documentation

## 2014-03-18 DIAGNOSIS — Z7901 Long term (current) use of anticoagulants: Secondary | ICD-10-CM | POA: Insufficient documentation

## 2014-03-18 DIAGNOSIS — Z862 Personal history of diseases of the blood and blood-forming organs and certain disorders involving the immune mechanism: Secondary | ICD-10-CM | POA: Insufficient documentation

## 2014-03-18 DIAGNOSIS — G40909 Epilepsy, unspecified, not intractable, without status epilepticus: Secondary | ICD-10-CM | POA: Insufficient documentation

## 2014-03-18 LAB — PROTIME-INR
INR: 2.03 — ABNORMAL HIGH (ref 0.00–1.49)
PROTHROMBIN TIME: 22.3 s — AB (ref 11.6–15.2)

## 2014-03-18 LAB — COMPREHENSIVE METABOLIC PANEL
ALBUMIN: 3.6 g/dL (ref 3.5–5.2)
ALT: 18 U/L (ref 0–35)
AST: 25 U/L (ref 0–37)
Alkaline Phosphatase: 94 U/L (ref 39–117)
BUN: 15 mg/dL (ref 6–23)
CHLORIDE: 110 meq/L (ref 96–112)
CO2: 25 mEq/L (ref 19–32)
CREATININE: 1.2 mg/dL — AB (ref 0.50–1.10)
Calcium: 9.1 mg/dL (ref 8.4–10.5)
GFR calc Af Amer: 56 mL/min — ABNORMAL LOW (ref >90)
GFR calc non Af Amer: 48 mL/min — ABNORMAL LOW (ref >90)
GLUCOSE: 132 mg/dL — AB (ref 70–99)
Potassium: 3.7 mEq/L (ref 3.7–5.3)
Sodium: 146 mEq/L (ref 137–147)
Total Bilirubin: 0.2 mg/dL — ABNORMAL LOW (ref 0.3–1.2)
Total Protein: 7.1 g/dL (ref 6.0–8.3)

## 2014-03-18 LAB — CBC WITH DIFFERENTIAL/PLATELET
BASOS PCT: 0 % (ref 0–1)
Basophils Absolute: 0 10*3/uL (ref 0.0–0.1)
EOS ABS: 0.1 10*3/uL (ref 0.0–0.7)
Eosinophils Relative: 2 % (ref 0–5)
HEMATOCRIT: 35.9 % — AB (ref 36.0–46.0)
HEMOGLOBIN: 12 g/dL (ref 12.0–15.0)
LYMPHS ABS: 1.6 10*3/uL (ref 0.7–4.0)
Lymphocytes Relative: 34 % (ref 12–46)
MCH: 26.4 pg (ref 26.0–34.0)
MCHC: 33.4 g/dL (ref 30.0–36.0)
MCV: 78.9 fL (ref 78.0–100.0)
MONO ABS: 0.3 10*3/uL (ref 0.1–1.0)
MONOS PCT: 7 % (ref 3–12)
Neutro Abs: 2.6 10*3/uL (ref 1.7–7.7)
Neutrophils Relative %: 56 % (ref 43–77)
Platelets: 200 10*3/uL (ref 150–400)
RBC: 4.55 MIL/uL (ref 3.87–5.11)
RDW: 15 % (ref 11.5–15.5)
WBC: 4.6 10*3/uL (ref 4.0–10.5)

## 2014-03-18 LAB — TROPONIN I: Troponin I: <0.3 ng/mL (ref <0.30)

## 2014-03-18 MED ORDER — OXYCODONE-ACETAMINOPHEN 5-325 MG PO TABS: 2.0000 | ORAL_TABLET | ORAL | Status: DC | PRN

## 2014-03-18 MED ORDER — OXYCODONE-ACETAMINOPHEN 5-325 MG PO TABS
2.0000 | ORAL_TABLET | Freq: Once | ORAL | Status: AC
  Administered 2014-03-18: 2 via ORAL
  Filled 2014-03-18: qty 2

## 2014-03-18 MED ORDER — DIPHENHYDRAMINE HCL 50 MG/ML IJ SOLN
25.0000 mg | Freq: Once | INTRAMUSCULAR | Status: AC
  Administered 2014-03-18: 25 mg via INTRAMUSCULAR
  Filled 2014-03-18: qty 1

## 2014-03-18 MED ORDER — METOCLOPRAMIDE HCL 5 MG/ML IJ SOLN
10.0000 mg | Freq: Once | INTRAMUSCULAR | Status: AC
  Administered 2014-03-18: 10 mg via INTRAMUSCULAR
  Filled 2014-03-18: qty 2

## 2014-03-18 NOTE — ED Notes (Signed)
Headache starting 0300 while sleeping, woke her from sleeping. Also c/o intermittent chest pain with dizziness.

## 2014-03-18 NOTE — Discharge Instructions (Signed)
Migraine Headache A migraine headache is an intense, throbbing pain on one or both sides of your head. A migraine can last for 30 minutes to several hours. CAUSES  The exact cause of a migraine headache is not always known. However, a migraine may be caused when nerves in the brain become irritated and release chemicals that cause inflammation. This causes pain. Certain things may also trigger migraines, such as:  Alcohol.  Smoking.  Stress.  Menstruation.  Aged cheeses.  Foods or drinks that contain nitrates, glutamate, aspartame, or tyramine.  Lack of sleep.  Chocolate.  Caffeine.  Hunger.  Physical exertion.  Fatigue.  Medicines used to treat chest pain (nitroglycerine), birth control pills, estrogen, and some blood pressure medicines. SIGNS AND SYMPTOMS  Pain on one or both sides of your head.  Pulsating or throbbing pain.  Severe pain that prevents daily activities.  Pain that is aggravated by any physical activity.  Nausea, vomiting, or both.  Dizziness.  Pain with exposure to bright lights, loud noises, or activity.  General sensitivity to bright lights, loud noises, or smells. Before you get a migraine, you may get warning signs that a migraine is coming (aura). An aura may include:  Seeing flashing lights.  Seeing bright spots, halos, or zig-zag lines.  Having tunnel vision or blurred vision.  Having feelings of numbness or tingling.  Having trouble talking.  Having muscle weakness. DIAGNOSIS  A migraine headache is often diagnosed based on:  Symptoms.  Physical exam.  A CT scan or MRI of your head. These imaging tests cannot diagnose migraines, but they can help rule out other causes of headaches. TREATMENT Medicines may be given for pain and nausea. Medicines can also be given to help prevent recurrent migraines.  HOME CARE INSTRUCTIONS  Only take over-the-counter or prescription medicines for pain or discomfort as directed by your  health care provider. The use of long-term narcotics is not recommended.  Lie down in a dark, quiet room when you have a migraine.  Keep a journal to find out what may trigger your migraine headaches. For example, write down:  What you eat and drink.  How much sleep you get.  Any change to your diet or medicines.  Limit alcohol consumption.  Quit smoking if you smoke.  Get 7 9 hours of sleep, or as recommended by your health care provider.  Limit stress.  Keep lights dim if bright lights bother you and make your migraines worse. SEEK IMMEDIATE MEDICAL CARE IF:   Your migraine becomes severe.  You have a fever.  You have a stiff neck.  You have vision loss.  You have muscular weakness or loss of muscle control.  You start losing your balance or have trouble walking.  You feel faint or pass out.  You have severe symptoms that are different from your first symptoms. MAKE SURE YOU:   Understand these instructions.  Will watch your condition.  Will get help right away if you are not doing well or get worse. Document Released: 09/20/2005 Document Revised: 07/11/2013 Document Reviewed: 05/28/2013 [employer] Patient Information 2014 [employer].  
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
## 2014-03-18 NOTE — ED Notes (Signed)
D/c home with ride- directed to pharmacy to pick prescriptions

## 2014-03-18 NOTE — ED Provider Notes (Signed)
Medical screening examination/treatment/procedure(s) were conducted as a shared visit with non-physician practitioner(s) and myself.  I personally evaluated the patient during the encounter.   EKG Interpretation None      Patient here with headache. Sudden onset, improving. No mental status changes, no neurological changes. Has CP also - has cards f/u tomorrow. Headache improved after vicodin and reglan/benadryl. Ambulating without difficulty. CT normal, doubt SAH. Stable for discharge.  Osvaldo Shipper, MD 03/18/14 [phone]

## 2014-04-01 ENCOUNTER — Encounter (HOSPITAL_BASED_OUTPATIENT_CLINIC_OR_DEPARTMENT_OTHER): Payer: Self-pay | Admitting: Emergency Medicine

## 2014-04-01 ENCOUNTER — Emergency Department (HOSPITAL_BASED_OUTPATIENT_CLINIC_OR_DEPARTMENT_OTHER)
Admission: EM | Admit: 2014-04-01 | Discharge: 2014-04-01 | Disposition: A | Discharge status: Home / Self Care | Payer: Medicare Other | Attending: Emergency Medicine | Admitting: Emergency Medicine

## 2014-04-01 DIAGNOSIS — R011 Cardiac murmur, unspecified: Secondary | ICD-10-CM | POA: Insufficient documentation

## 2014-04-01 DIAGNOSIS — S1096XA Insect bite of unspecified part of neck, initial encounter: Secondary | ICD-10-CM | POA: Diagnosis not present

## 2014-04-01 DIAGNOSIS — Z8619 Personal history of other infectious and parasitic diseases: Secondary | ICD-10-CM | POA: Insufficient documentation

## 2014-04-01 DIAGNOSIS — M129 Arthropathy, unspecified: Secondary | ICD-10-CM | POA: Diagnosis not present

## 2014-04-01 DIAGNOSIS — Z7901 Long term (current) use of anticoagulants: Secondary | ICD-10-CM | POA: Insufficient documentation

## 2014-04-01 DIAGNOSIS — G43909 Migraine, unspecified, not intractable, without status migrainosus: Secondary | ICD-10-CM | POA: Diagnosis not present

## 2014-04-01 DIAGNOSIS — Z8673 Personal history of transient ischemic attack (TIA), and cerebral infarction without residual deficits: Secondary | ICD-10-CM | POA: Insufficient documentation

## 2014-04-01 DIAGNOSIS — F411 Generalized anxiety disorder: Secondary | ICD-10-CM | POA: Insufficient documentation

## 2014-04-01 DIAGNOSIS — I10 Essential (primary) hypertension: Secondary | ICD-10-CM | POA: Diagnosis not present

## 2014-04-01 DIAGNOSIS — Y9389 Activity, other specified: Secondary | ICD-10-CM | POA: Diagnosis not present

## 2014-04-01 DIAGNOSIS — Z79899 Other long term (current) drug therapy: Secondary | ICD-10-CM | POA: Insufficient documentation

## 2014-04-01 DIAGNOSIS — Z862 Personal history of diseases of the blood and blood-forming organs and certain disorders involving the immune mechanism: Secondary | ICD-10-CM | POA: Insufficient documentation

## 2014-04-01 DIAGNOSIS — E785 Hyperlipidemia, unspecified: Secondary | ICD-10-CM | POA: Insufficient documentation

## 2014-04-01 DIAGNOSIS — Y9289 Other specified places as the place of occurrence of the external cause: Secondary | ICD-10-CM | POA: Insufficient documentation

## 2014-04-01 DIAGNOSIS — J45909 Unspecified asthma, uncomplicated: Secondary | ICD-10-CM | POA: Insufficient documentation

## 2014-04-01 DIAGNOSIS — Z8719 Personal history of other diseases of the digestive system: Secondary | ICD-10-CM | POA: Insufficient documentation

## 2014-04-01 DIAGNOSIS — G40909 Epilepsy, unspecified, not intractable, without status epilepticus: Secondary | ICD-10-CM | POA: Insufficient documentation

## 2014-04-01 DIAGNOSIS — Z7902 Long term (current) use of antithrombotics/antiplatelets: Secondary | ICD-10-CM | POA: Diagnosis not present

## 2014-04-01 DIAGNOSIS — Z87891 Personal history of nicotine dependence: Secondary | ICD-10-CM | POA: Insufficient documentation

## 2014-04-01 DIAGNOSIS — W57XXXA Bitten or stung by nonvenomous insect and other nonvenomous arthropods, initial encounter: Secondary | ICD-10-CM | POA: Diagnosis present

## 2014-04-01 MED ORDER — DIPHENHYDRAMINE HCL 25 MG PO CAPS
25.0000 mg | ORAL_CAPSULE | Freq: Once | ORAL | Status: AC
  Administered 2014-04-01: 25 mg via ORAL
  Filled 2014-04-01: qty 1
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
## 2014-04-01 NOTE — ED Provider Notes (Signed)
Medical screening examination/treatment/procedure(s) were performed by non-physician practitioner and as supervising physician I was immediately available for consultation/collaboration.   EKG Interpretation None       Threasa Beards, MD 04/01/14 1526

## 2014-04-01 NOTE — ED Notes (Signed)
Thinks she got bite by spider on right side of face.

## 2014-04-01 NOTE — Discharge Instructions (Signed)
Follow up as discussed for increased redness or warmth Insect Bite Mosquitoes, flies, fleas, bedbugs, and other insects can bite. Insect bites are different from insect stings. The bite may be red, puffy (swollen), and itchy for 2 to 4 days. Most bites get better on their own. HOME CARE   Do not scratch the bite.  Keep the bite clean and dry. Wash the bite with soap and water.  Put ice on the bite.  Put ice in a plastic bag.  Place a towel between your skin and the bag.  Leave the ice on for 20 minutes, 4 times a day. Do this for the first 2 to 3 days, or as told by your doctor.  You may use medicated lotions or creams to lessen itching as told by your doctor.  Only take medicines as told by your doctor.  If you are given medicines (antibiotics), take them as told. Finish them even if you start to feel better. You may need a tetanus shot if:  You cannot remember when you had your last tetanus shot.  You have never had a tetanus shot.  The injury broke your skin. If you need a tetanus shot and you choose not to have one, you may get tetanus. Sickness from tetanus can be serious. GET HELP RIGHT AWAY IF:   You have more pain, redness, or puffiness.  You see a red line on the skin coming from the bite.  You have a fever.  You have joint pain.  You have a headache or neck pain.  You feel weak.  You have a rash.  You have chest pain, or you are short of breath.  You have belly (abdominal) pain.  You feel sick to your stomach (nauseous) or throw up (vomit).  You feel very tired or sleepy. MAKE SURE YOU:   Understand these instructions.  Will watch your condition.  Will get help right away if you are not doing well or get worse. Document Released: 09/17/2000 Document Revised: 12/13/2011 Document Reviewed: 04/21/2011 [facility] Patient Information 2015 [place], [place]. This information is not intended to replace advice given to you by your health care provider. Make  sure you discuss any questions you have with your health care provider.

## 2014-05-09 ENCOUNTER — Encounter: Payer: Self-pay | Admitting: *Deleted

## 2014-05-16 ENCOUNTER — Emergency Department (HOSPITAL_BASED_OUTPATIENT_CLINIC_OR_DEPARTMENT_OTHER): Payer: Medicare Other

## 2014-05-16 ENCOUNTER — Encounter (HOSPITAL_BASED_OUTPATIENT_CLINIC_OR_DEPARTMENT_OTHER): Payer: Self-pay | Admitting: Emergency Medicine

## 2014-05-16 ENCOUNTER — Emergency Department (HOSPITAL_BASED_OUTPATIENT_CLINIC_OR_DEPARTMENT_OTHER)
Admission: EM | Admit: 2014-05-16 | Discharge: 2014-05-17 | Disposition: A | Discharge status: Home / Self Care | Payer: Medicare Other | Attending: Emergency Medicine | Admitting: Emergency Medicine

## 2014-05-16 DIAGNOSIS — R0789 Other chest pain: Secondary | ICD-10-CM | POA: Diagnosis not present

## 2014-05-16 DIAGNOSIS — I209 Angina pectoris, unspecified: Secondary | ICD-10-CM | POA: Diagnosis not present

## 2014-05-16 DIAGNOSIS — J45909 Unspecified asthma, uncomplicated: Secondary | ICD-10-CM | POA: Insufficient documentation

## 2014-05-16 DIAGNOSIS — Z7902 Long term (current) use of antithrombotics/antiplatelets: Secondary | ICD-10-CM | POA: Insufficient documentation

## 2014-05-16 DIAGNOSIS — Z8669 Personal history of other diseases of the nervous system and sense organs: Secondary | ICD-10-CM | POA: Diagnosis not present

## 2014-05-16 DIAGNOSIS — IMO0002 Reserved for concepts with insufficient information to code with codable children: Secondary | ICD-10-CM | POA: Insufficient documentation

## 2014-05-16 DIAGNOSIS — M129 Arthropathy, unspecified: Secondary | ICD-10-CM | POA: Diagnosis not present

## 2014-05-16 DIAGNOSIS — Z9889 Other specified postprocedural states: Secondary | ICD-10-CM | POA: Insufficient documentation

## 2014-05-16 DIAGNOSIS — Z8719 Personal history of other diseases of the digestive system: Secondary | ICD-10-CM | POA: Diagnosis not present

## 2014-05-16 DIAGNOSIS — R079 Chest pain, unspecified: Secondary | ICD-10-CM | POA: Diagnosis present

## 2014-05-16 DIAGNOSIS — Z87828 Personal history of other (healed) physical injury and trauma: Secondary | ICD-10-CM | POA: Diagnosis not present

## 2014-05-16 DIAGNOSIS — Z7901 Long term (current) use of anticoagulants: Secondary | ICD-10-CM | POA: Diagnosis not present

## 2014-05-16 DIAGNOSIS — R011 Cardiac murmur, unspecified: Secondary | ICD-10-CM | POA: Insufficient documentation

## 2014-05-16 DIAGNOSIS — Z79899 Other long term (current) drug therapy: Secondary | ICD-10-CM | POA: Diagnosis not present

## 2014-05-16 DIAGNOSIS — E785 Hyperlipidemia, unspecified: Secondary | ICD-10-CM | POA: Insufficient documentation

## 2014-05-16 DIAGNOSIS — I10 Essential (primary) hypertension: Secondary | ICD-10-CM | POA: Diagnosis not present

## 2014-05-16 DIAGNOSIS — Z8673 Personal history of transient ischemic attack (TIA), and cerebral infarction without residual deficits: Secondary | ICD-10-CM | POA: Diagnosis not present

## 2014-05-16 LAB — COMPREHENSIVE METABOLIC PANEL
ALBUMIN: 4 g/dL (ref 3.5–5.2)
ALT: 29 U/L (ref 0–35)
ANION GAP: 12 (ref 5–15)
AST: 33 U/L (ref 0–37)
Alkaline Phosphatase: 76 U/L (ref 39–117)
BUN: 12 mg/dL (ref 6–23)
CHLORIDE: 107 meq/L (ref 96–112)
CO2: 25 mEq/L (ref 19–32)
Calcium: 9.8 mg/dL (ref 8.4–10.5)
Creatinine, Ser: 1 mg/dL (ref 0.50–1.10)
GFR calc Af Amer: 70 mL/min — ABNORMAL LOW (ref >90)
GFR calc non Af Amer: 60 mL/min — ABNORMAL LOW (ref >90)
Glucose, Bld: 101 mg/dL — ABNORMAL HIGH (ref 70–99)
POTASSIUM: 3.5 meq/L — AB (ref 3.7–5.3)
SODIUM: 144 meq/L (ref 137–147)
TOTAL PROTEIN: 8.1 g/dL (ref 6.0–8.3)
Total Bilirubin: 0.6 mg/dL (ref 0.3–1.2)

## 2014-05-16 LAB — CBC
HCT: 36.3 % (ref 36.0–46.0)
HEMOGLOBIN: 12.1 g/dL (ref 12.0–15.0)
MCH: 25.6 pg — ABNORMAL LOW (ref 26.0–34.0)
MCHC: 33.3 g/dL (ref 30.0–36.0)
MCV: 76.7 fL — ABNORMAL LOW (ref 78.0–100.0)
Platelets: 223 10*3/uL (ref 150–400)
RBC: 4.73 MIL/uL (ref 3.87–5.11)
RDW: 15.4 % (ref 11.5–15.5)
WBC: 6.3 10*3/uL (ref 4.0–10.5)

## 2014-05-16 LAB — TROPONIN I

## 2014-05-16 NOTE — ED Notes (Signed)
Patient transported to X-ray 

## 2014-05-16 NOTE — ED Notes (Signed)
Pt c/o chest pain that began 30 minutes to 1 hour ago. Pt very vague with description and answers "yep" to all questions regarding CP. Pt also c/o HA.

## 2014-05-17 DIAGNOSIS — R0789 Other chest pain: Secondary | ICD-10-CM | POA: Diagnosis not present

## 2014-05-17 LAB — PROTIME-INR
INR: 1.68 — AB (ref 0.00–1.49)
Prothrombin Time: 19.8 seconds — ABNORMAL HIGH (ref 11.6–15.2)

## 2014-05-17 LAB — TROPONIN I: Troponin I: <0.3 ng/mL (ref <0.30)

## 2014-05-17 MED ORDER — MORPHINE SULFATE 4 MG/ML IJ SOLN
4.0000 mg | Freq: Once | INTRAMUSCULAR | Status: AC
  Administered 2014-05-17: 4 mg via INTRAVENOUS
  Filled 2014-05-17: qty 1

## 2014-05-17 NOTE — Discharge Instructions (Signed)
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
## 2014-05-28 ENCOUNTER — Encounter (HOSPITAL_BASED_OUTPATIENT_CLINIC_OR_DEPARTMENT_OTHER): Payer: Self-pay | Admitting: Emergency Medicine

## 2014-05-28 ENCOUNTER — Emergency Department (HOSPITAL_BASED_OUTPATIENT_CLINIC_OR_DEPARTMENT_OTHER): Payer: Medicare Other

## 2014-05-28 ENCOUNTER — Emergency Department (HOSPITAL_BASED_OUTPATIENT_CLINIC_OR_DEPARTMENT_OTHER)
Admission: EM | Admit: 2014-05-28 | Discharge: 2014-05-29 | Disposition: A | Discharge status: Home / Self Care | Payer: Medicare Other | Attending: Emergency Medicine | Admitting: Emergency Medicine

## 2014-05-28 DIAGNOSIS — R011 Cardiac murmur, unspecified: Secondary | ICD-10-CM | POA: Diagnosis not present

## 2014-05-28 DIAGNOSIS — Z862 Personal history of diseases of the blood and blood-forming organs and certain disorders involving the immune mechanism: Secondary | ICD-10-CM | POA: Insufficient documentation

## 2014-05-28 DIAGNOSIS — Z79899 Other long term (current) drug therapy: Secondary | ICD-10-CM | POA: Diagnosis not present

## 2014-05-28 DIAGNOSIS — Z87891 Personal history of nicotine dependence: Secondary | ICD-10-CM | POA: Insufficient documentation

## 2014-05-28 DIAGNOSIS — F411 Generalized anxiety disorder: Secondary | ICD-10-CM | POA: Insufficient documentation

## 2014-05-28 DIAGNOSIS — I10 Essential (primary) hypertension: Secondary | ICD-10-CM | POA: Insufficient documentation

## 2014-05-28 DIAGNOSIS — J45909 Unspecified asthma, uncomplicated: Secondary | ICD-10-CM | POA: Insufficient documentation

## 2014-05-28 DIAGNOSIS — E785 Hyperlipidemia, unspecified: Secondary | ICD-10-CM | POA: Diagnosis not present

## 2014-05-28 DIAGNOSIS — Z87828 Personal history of other (healed) physical injury and trauma: Secondary | ICD-10-CM | POA: Insufficient documentation

## 2014-05-28 DIAGNOSIS — R071 Chest pain on breathing: Secondary | ICD-10-CM | POA: Diagnosis not present

## 2014-05-28 DIAGNOSIS — Z9889 Other specified postprocedural states: Secondary | ICD-10-CM | POA: Insufficient documentation

## 2014-05-28 DIAGNOSIS — R110 Nausea: Secondary | ICD-10-CM | POA: Insufficient documentation

## 2014-05-28 DIAGNOSIS — R079 Chest pain, unspecified: Secondary | ICD-10-CM | POA: Insufficient documentation

## 2014-05-28 DIAGNOSIS — Z7901 Long term (current) use of anticoagulants: Secondary | ICD-10-CM | POA: Diagnosis not present

## 2014-05-28 DIAGNOSIS — Z8669 Personal history of other diseases of the nervous system and sense organs: Secondary | ICD-10-CM | POA: Diagnosis not present

## 2014-05-28 DIAGNOSIS — Z8673 Personal history of transient ischemic attack (TIA), and cerebral infarction without residual deficits: Secondary | ICD-10-CM | POA: Diagnosis not present

## 2014-05-28 DIAGNOSIS — R0789 Other chest pain: Secondary | ICD-10-CM

## 2014-05-28 DIAGNOSIS — G43909 Migraine, unspecified, not intractable, without status migrainosus: Secondary | ICD-10-CM | POA: Diagnosis not present

## 2014-05-28 DIAGNOSIS — Z7902 Long term (current) use of antithrombotics/antiplatelets: Secondary | ICD-10-CM | POA: Diagnosis not present

## 2014-05-28 HISTORY — DX: Unspecified atrial fibrillation: I48.91

## 2014-05-28 LAB — CBC WITH DIFFERENTIAL/PLATELET
BASOS ABS: 0 10*3/uL (ref 0.0–0.1)
Basophils Relative: 0 % (ref 0–1)
EOS PCT: 3 % (ref 0–5)
Eosinophils Absolute: 0.1 10*3/uL (ref 0.0–0.7)
HCT: 36.5 % (ref 36.0–46.0)
HEMOGLOBIN: 12.2 g/dL (ref 12.0–15.0)
LYMPHS ABS: 1.6 10*3/uL (ref 0.7–4.0)
Lymphocytes Relative: 31 % (ref 12–46)
MCH: 25.7 pg — ABNORMAL LOW (ref 26.0–34.0)
MCHC: 33.4 g/dL (ref 30.0–36.0)
MCV: 76.8 fL — AB (ref 78.0–100.0)
MONO ABS: 0.4 10*3/uL (ref 0.1–1.0)
MONOS PCT: 7 % (ref 3–12)
NEUTROS ABS: 3.1 10*3/uL (ref 1.7–7.7)
Neutrophils Relative %: 59 % (ref 43–77)
Platelets: 210 10*3/uL (ref 150–400)
RBC: 4.75 MIL/uL (ref 3.87–5.11)
RDW: 15 % (ref 11.5–15.5)
WBC: 5.2 10*3/uL (ref 4.0–10.5)

## 2014-05-28 NOTE — ED Notes (Signed)
MD at bedside. 

## 2014-05-28 NOTE — ED Notes (Signed)
Patient transported to X-ray via wheelchair per tech.
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
## 2014-05-28 NOTE — ED Notes (Signed)
Pt. Is reporting chest pain on Left and Right side of chest.  Pt. Reports nausea and no vomiting.  Pt. Reports her R arm is numb and she feels heaviness in her chest.

## 2014-05-29 ENCOUNTER — Emergency Department (HOSPITAL_BASED_OUTPATIENT_CLINIC_OR_DEPARTMENT_OTHER): Payer: Medicare Other

## 2014-05-29 ENCOUNTER — Encounter (HOSPITAL_BASED_OUTPATIENT_CLINIC_OR_DEPARTMENT_OTHER): Payer: Self-pay | Admitting: Emergency Medicine

## 2014-05-29 DIAGNOSIS — R071 Chest pain on breathing: Secondary | ICD-10-CM | POA: Diagnosis not present

## 2014-05-29 LAB — BASIC METABOLIC PANEL
Anion gap: 12 (ref 5–15)
BUN: 12 mg/dL (ref 6–23)
CHLORIDE: 104 meq/L (ref 96–112)
CO2: 26 meq/L (ref 19–32)
Calcium: 9.6 mg/dL (ref 8.4–10.5)
Creatinine, Ser: 0.9 mg/dL (ref 0.50–1.10)
GFR, EST AFRICAN AMERICAN: 80 mL/min — AB (ref >90)
GFR, EST NON AFRICAN AMERICAN: 69 mL/min — AB (ref >90)
GLUCOSE: 111 mg/dL — AB (ref 70–99)
Potassium: 3.6 mEq/L — ABNORMAL LOW (ref 3.7–5.3)
Sodium: 142 mEq/L (ref 137–147)

## 2014-05-29 LAB — TROPONIN I: Troponin I: <0.3 ng/mL (ref <0.30)

## 2014-05-29 LAB — PROTIME-INR
INR: 1.81 — ABNORMAL HIGH (ref 0.00–1.49)
PROTHROMBIN TIME: 21 s — AB (ref 11.6–15.2)

## 2014-05-29 MED ORDER — IOHEXOL 350 MG/ML SOLN
80.0000 mL | Freq: Once | INTRAVENOUS | Status: AC | PRN
  Administered 2014-05-29: 80 mL via INTRAVENOUS

## 2014-05-29 MED ORDER — MORPHINE SULFATE 2 MG/ML IJ SOLN
2.0000 mg | Freq: Once | INTRAMUSCULAR | Status: AC
  Administered 2014-05-29: 2 mg via INTRAVENOUS

## 2014-05-29 MED ORDER — ERYTHROMYCIN 5 MG/GM OP OINT: TOPICAL_OINTMENT | OPHTHALMIC | Status: DC

## 2014-05-29 MED ORDER — OXYCODONE-ACETAMINOPHEN 5-325 MG PO TABS
2.0000 | ORAL_TABLET | Freq: Once | ORAL | Status: AC
  Administered 2014-05-29: 2 via ORAL
  Filled 2014-05-29: qty 2

## 2014-05-29 MED ORDER — FLUTICASONE PROPIONATE 50 MCG/ACT NA SUSP: 2.0000 | Freq: Every day | NASAL | Status: DC

## 2014-05-29 MED ORDER — OXYCODONE-ACETAMINOPHEN 5-325 MG PO TABS: 1.0000 | ORAL_TABLET | Freq: Four times a day (QID) | ORAL | Status: DC | PRN

## 2014-05-29 MED ORDER — MORPHINE SULFATE 2 MG/ML IJ SOLN
INTRAMUSCULAR | Status: AC
  Filled 2014-05-29: qty 1

## 2014-05-29 MED ORDER — WARFARIN SODIUM 5 MG PO TABS
5.0000 mg | ORAL_TABLET | Freq: Once | ORAL | Status: AC
  Administered 2014-05-29: 5 mg via ORAL
  Filled 2014-05-29: qty 1

## 2014-05-29 MED ORDER — MORPHINE SULFATE 2 MG/ML IJ SOLN
2.0000 mg | Freq: Once | INTRAMUSCULAR | Status: AC
  Administered 2014-05-29: 2 mg via INTRAVENOUS
  Filled 2014-05-29: qty 1

## 2014-05-29 NOTE — Discharge Instructions (Signed)

## 2014-05-29 NOTE — ED Provider Notes (Signed)
345 called by flow manager to contact [facility] pharmacist regarding patient.   349 am Spoke with pharmacist at [facility]' on [address] in [place].  Pharmacist does not want to fill rx for percocet as patient filled RX for #120 percocet 10/325 prescribed by Dr. Melina Fiddler of [facility] on 05/22/14.  RX to be destroyed.    EDP had not seen this RX prior to prescribing the medication.    Carlisle Beers, MD 05/29/14 [phone]

## 2014-05-29 NOTE — ED Notes (Signed)
Patient transported to CT via stretcher per tech. 

## 2014-06-13 ENCOUNTER — Encounter (HOSPITAL_BASED_OUTPATIENT_CLINIC_OR_DEPARTMENT_OTHER): Payer: Self-pay | Admitting: Emergency Medicine

## 2014-06-13 DIAGNOSIS — Z8673 Personal history of transient ischemic attack (TIA), and cerebral infarction without residual deficits: Secondary | ICD-10-CM | POA: Diagnosis not present

## 2014-06-13 DIAGNOSIS — K219 Gastro-esophageal reflux disease without esophagitis: Secondary | ICD-10-CM | POA: Diagnosis not present

## 2014-06-13 DIAGNOSIS — G43909 Migraine, unspecified, not intractable, without status migrainosus: Secondary | ICD-10-CM | POA: Insufficient documentation

## 2014-06-13 DIAGNOSIS — Z7901 Long term (current) use of anticoagulants: Secondary | ICD-10-CM | POA: Diagnosis not present

## 2014-06-13 DIAGNOSIS — R51 Headache: Secondary | ICD-10-CM | POA: Insufficient documentation

## 2014-06-13 DIAGNOSIS — G8929 Other chronic pain: Secondary | ICD-10-CM | POA: Insufficient documentation

## 2014-06-13 DIAGNOSIS — F411 Generalized anxiety disorder: Secondary | ICD-10-CM | POA: Insufficient documentation

## 2014-06-13 DIAGNOSIS — Z79899 Other long term (current) drug therapy: Secondary | ICD-10-CM | POA: Diagnosis not present

## 2014-06-13 DIAGNOSIS — J45909 Unspecified asthma, uncomplicated: Secondary | ICD-10-CM | POA: Insufficient documentation

## 2014-06-13 DIAGNOSIS — Z87891 Personal history of nicotine dependence: Secondary | ICD-10-CM | POA: Insufficient documentation

## 2014-06-13 DIAGNOSIS — R011 Cardiac murmur, unspecified: Secondary | ICD-10-CM | POA: Insufficient documentation

## 2014-06-13 DIAGNOSIS — Z8739 Personal history of other diseases of the musculoskeletal system and connective tissue: Secondary | ICD-10-CM | POA: Diagnosis not present

## 2014-06-13 DIAGNOSIS — E785 Hyperlipidemia, unspecified: Secondary | ICD-10-CM | POA: Diagnosis not present

## 2014-06-13 DIAGNOSIS — Z87828 Personal history of other (healed) physical injury and trauma: Secondary | ICD-10-CM | POA: Insufficient documentation

## 2014-06-13 DIAGNOSIS — I10 Essential (primary) hypertension: Secondary | ICD-10-CM | POA: Insufficient documentation

## 2014-06-13 NOTE — ED Notes (Signed)
Headache all day

## 2014-06-14 ENCOUNTER — Emergency Department (HOSPITAL_BASED_OUTPATIENT_CLINIC_OR_DEPARTMENT_OTHER)
Admission: EM | Admit: 2014-06-14 | Discharge: 2014-06-14 | Disposition: A | Discharge status: Home / Self Care | Payer: Medicare Other | Attending: Emergency Medicine | Admitting: Emergency Medicine

## 2014-06-14 ENCOUNTER — Encounter (HOSPITAL_BASED_OUTPATIENT_CLINIC_OR_DEPARTMENT_OTHER): Payer: Self-pay | Admitting: Emergency Medicine

## 2014-06-14 DIAGNOSIS — R519 Headache, unspecified: Secondary | ICD-10-CM

## 2014-06-14 DIAGNOSIS — G43909 Migraine, unspecified, not intractable, without status migrainosus: Secondary | ICD-10-CM | POA: Diagnosis not present

## 2014-06-14 DIAGNOSIS — R51 Headache: Secondary | ICD-10-CM

## 2014-06-14 HISTORY — DX: Other chronic pain: G89.29

## 2014-06-14 HISTORY — DX: Dorsalgia, unspecified: M54.9

## 2014-06-14 MED ORDER — DIPHENHYDRAMINE HCL 50 MG/ML IJ SOLN
25.0000 mg | Freq: Once | INTRAMUSCULAR | Status: AC
  Administered 2014-06-14: 25 mg via INTRAVENOUS
  Filled 2014-06-14: qty 1

## 2014-06-14 MED ORDER — METOCLOPRAMIDE HCL 5 MG/ML IJ SOLN
10.0000 mg | Freq: Once | INTRAMUSCULAR | Status: AC
  Administered 2014-06-14: 10 mg via INTRAVENOUS
  Filled 2014-06-14: qty 2

## 2014-06-14 MED ORDER — SODIUM CHLORIDE 0.9 % IV BOLUS (SEPSIS)
1000.0000 mL | Freq: Once | INTRAVENOUS | Status: AC
  Administered 2014-06-14: 1000 mL via INTRAVENOUS

## 2014-06-14 NOTE — ED Notes (Signed)
States took 2 tylenol today and a percocet about 6 hrs ago

## 2014-06-14 NOTE — ED Notes (Signed)
Pt c/o ha x 2 hours w n/v  Has not taken anything for pain
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
## 2014-09-11 ENCOUNTER — Encounter (HOSPITAL_COMMUNITY): Payer: Self-pay | Admitting: Interventional Cardiology

## 2014-09-14 ENCOUNTER — Encounter (HOSPITAL_BASED_OUTPATIENT_CLINIC_OR_DEPARTMENT_OTHER): Payer: Self-pay | Admitting: *Deleted

## 2014-09-14 ENCOUNTER — Emergency Department (HOSPITAL_BASED_OUTPATIENT_CLINIC_OR_DEPARTMENT_OTHER)
Admission: EM | Admit: 2014-09-14 | Discharge: 2014-09-15 | Disposition: A | Discharge status: Home / Self Care | Payer: Medicare Other | Source: Home / Self Care | Attending: Emergency Medicine | Admitting: Emergency Medicine

## 2014-09-14 ENCOUNTER — Emergency Department (HOSPITAL_BASED_OUTPATIENT_CLINIC_OR_DEPARTMENT_OTHER): Payer: Medicare Other

## 2014-09-14 ENCOUNTER — Emergency Department (HOSPITAL_BASED_OUTPATIENT_CLINIC_OR_DEPARTMENT_OTHER)
Admission: EM | Admit: 2014-09-14 | Discharge: 2014-09-14 | Disposition: A | Discharge status: Home / Self Care | Payer: Medicare Other | Attending: Emergency Medicine | Admitting: Emergency Medicine

## 2014-09-14 ENCOUNTER — Encounter (HOSPITAL_BASED_OUTPATIENT_CLINIC_OR_DEPARTMENT_OTHER): Payer: Self-pay | Admitting: Emergency Medicine

## 2014-09-14 DIAGNOSIS — Z862 Personal history of diseases of the blood and blood-forming organs and certain disorders involving the immune mechanism: Secondary | ICD-10-CM | POA: Insufficient documentation

## 2014-09-14 DIAGNOSIS — I209 Angina pectoris, unspecified: Secondary | ICD-10-CM | POA: Diagnosis not present

## 2014-09-14 DIAGNOSIS — J45901 Unspecified asthma with (acute) exacerbation: Secondary | ICD-10-CM

## 2014-09-14 DIAGNOSIS — J45909 Unspecified asthma, uncomplicated: Secondary | ICD-10-CM | POA: Insufficient documentation

## 2014-09-14 DIAGNOSIS — G43909 Migraine, unspecified, not intractable, without status migrainosus: Secondary | ICD-10-CM | POA: Diagnosis not present

## 2014-09-14 DIAGNOSIS — G8929 Other chronic pain: Secondary | ICD-10-CM | POA: Insufficient documentation

## 2014-09-14 DIAGNOSIS — Z79899 Other long term (current) drug therapy: Secondary | ICD-10-CM

## 2014-09-14 DIAGNOSIS — Z87828 Personal history of other (healed) physical injury and trauma: Secondary | ICD-10-CM

## 2014-09-14 DIAGNOSIS — Z8673 Personal history of transient ischemic attack (TIA), and cerebral infarction without residual deficits: Secondary | ICD-10-CM | POA: Insufficient documentation

## 2014-09-14 DIAGNOSIS — F419 Anxiety disorder, unspecified: Secondary | ICD-10-CM | POA: Insufficient documentation

## 2014-09-14 DIAGNOSIS — D689 Coagulation defect, unspecified: Secondary | ICD-10-CM | POA: Diagnosis not present

## 2014-09-14 DIAGNOSIS — Z7902 Long term (current) use of antithrombotics/antiplatelets: Secondary | ICD-10-CM | POA: Insufficient documentation

## 2014-09-14 DIAGNOSIS — Z9889 Other specified postprocedural states: Secondary | ICD-10-CM | POA: Insufficient documentation

## 2014-09-14 DIAGNOSIS — E785 Hyperlipidemia, unspecified: Secondary | ICD-10-CM | POA: Insufficient documentation

## 2014-09-14 DIAGNOSIS — R079 Chest pain, unspecified: Secondary | ICD-10-CM

## 2014-09-14 DIAGNOSIS — R51 Headache: Secondary | ICD-10-CM

## 2014-09-14 DIAGNOSIS — R011 Cardiac murmur, unspecified: Secondary | ICD-10-CM | POA: Insufficient documentation

## 2014-09-14 DIAGNOSIS — I10 Essential (primary) hypertension: Secondary | ICD-10-CM | POA: Insufficient documentation

## 2014-09-14 DIAGNOSIS — Z87891 Personal history of nicotine dependence: Secondary | ICD-10-CM | POA: Insufficient documentation

## 2014-09-14 DIAGNOSIS — I4891 Unspecified atrial fibrillation: Secondary | ICD-10-CM | POA: Insufficient documentation

## 2014-09-14 DIAGNOSIS — R519 Headache, unspecified: Secondary | ICD-10-CM

## 2014-09-14 DIAGNOSIS — G40909 Epilepsy, unspecified, not intractable, without status epilepticus: Secondary | ICD-10-CM | POA: Insufficient documentation

## 2014-09-14 DIAGNOSIS — R52 Pain, unspecified: Secondary | ICD-10-CM

## 2014-09-14 DIAGNOSIS — Z8719 Personal history of other diseases of the digestive system: Secondary | ICD-10-CM | POA: Insufficient documentation

## 2014-09-14 DIAGNOSIS — M1990 Unspecified osteoarthritis, unspecified site: Secondary | ICD-10-CM | POA: Insufficient documentation

## 2014-09-14 DIAGNOSIS — Z7901 Long term (current) use of anticoagulants: Secondary | ICD-10-CM

## 2014-09-14 DIAGNOSIS — K219 Gastro-esophageal reflux disease without esophagitis: Secondary | ICD-10-CM

## 2014-09-14 LAB — CBC WITH DIFFERENTIAL/PLATELET
BASOS ABS: 0 10*3/uL (ref 0.0–0.1)
Basophils Relative: 0 % (ref 0–1)
Eosinophils Absolute: 0 10*3/uL (ref 0.0–0.7)
Eosinophils Relative: 0 % (ref 0–5)
HCT: 37.4 % (ref 36.0–46.0)
Hemoglobin: 12.4 g/dL (ref 12.0–15.0)
LYMPHS PCT: 13 % (ref 12–46)
Lymphs Abs: 1.5 10*3/uL (ref 0.7–4.0)
MCH: 25.2 pg — ABNORMAL LOW (ref 26.0–34.0)
MCHC: 33.2 g/dL (ref 30.0–36.0)
MCV: 76 fL — AB (ref 78.0–100.0)
Monocytes Absolute: 0.7 10*3/uL (ref 0.1–1.0)
Monocytes Relative: 6 % (ref 3–12)
NEUTROS PCT: 81 % — AB (ref 43–77)
Neutro Abs: 8.8 10*3/uL — ABNORMAL HIGH (ref 1.7–7.7)
PLATELETS: 304 10*3/uL (ref 150–400)
RBC: 4.92 MIL/uL (ref 3.87–5.11)
RDW: 16.1 % — AB (ref 11.5–15.5)
WBC: 11 10*3/uL — AB (ref 4.0–10.5)

## 2014-09-14 LAB — COMPREHENSIVE METABOLIC PANEL
ALK PHOS: 75 U/L (ref 39–117)
ALT: 29 U/L (ref 0–35)
AST: 37 U/L (ref 0–37)
Albumin: 4.1 g/dL (ref 3.5–5.2)
Anion gap: 17 — ABNORMAL HIGH (ref 5–15)
BUN: 20 mg/dL (ref 6–23)
CO2: 21 mEq/L (ref 19–32)
Calcium: 9.2 mg/dL (ref 8.4–10.5)
Chloride: 105 mEq/L (ref 96–112)
Creatinine, Ser: 1 mg/dL (ref 0.50–1.10)
GFR calc Af Amer: 70 mL/min — ABNORMAL LOW (ref >90)
GFR calc non Af Amer: 60 mL/min — ABNORMAL LOW (ref >90)
Glucose, Bld: 134 mg/dL — ABNORMAL HIGH (ref 70–99)
POTASSIUM: 3.9 meq/L (ref 3.7–5.3)
SODIUM: 143 meq/L (ref 137–147)
TOTAL PROTEIN: 8.3 g/dL (ref 6.0–8.3)
Total Bilirubin: 0.3 mg/dL (ref 0.3–1.2)

## 2014-09-14 LAB — PROTIME-INR
INR: 2.02 — ABNORMAL HIGH (ref 0.00–1.49)
PROTHROMBIN TIME: 22.9 s — AB (ref 11.6–15.2)

## 2014-09-14 LAB — TROPONIN I: Troponin I: <0.3 ng/mL (ref <0.30)

## 2014-09-14 LAB — PRO B NATRIURETIC PEPTIDE: PRO B NATRI PEPTIDE: 136.1 pg/mL — AB (ref 0–125)

## 2014-09-14 MED ORDER — SODIUM CHLORIDE 0.9 % IV BOLUS (SEPSIS)
1000.0000 mL | Freq: Once | INTRAVENOUS | Status: AC
  Administered 2014-09-14: 1000 mL via INTRAVENOUS

## 2014-09-14 MED ORDER — METOCLOPRAMIDE HCL 5 MG/ML IJ SOLN
10.0000 mg | Freq: Once | INTRAMUSCULAR | Status: AC
  Administered 2014-09-14: 10 mg via INTRAVENOUS
  Filled 2014-09-14: qty 2

## 2014-09-14 MED ORDER — DIPHENHYDRAMINE HCL 50 MG/ML IJ SOLN
25.0000 mg | Freq: Once | INTRAMUSCULAR | Status: AC
  Administered 2014-09-14: 25 mg via INTRAVENOUS
  Filled 2014-09-14: qty 1

## 2014-09-14 MED ORDER — DEXAMETHASONE SODIUM PHOSPHATE 10 MG/ML IJ SOLN
10.0000 mg | Freq: Once | INTRAMUSCULAR | Status: AC
  Administered 2014-09-14: 10 mg via INTRAVENOUS
  Filled 2014-09-14: qty 1

## 2014-09-14 MED ORDER — METOCLOPRAMIDE HCL 5 MG/ML IJ SOLN
10.0000 mg | Freq: Once | INTRAMUSCULAR | Status: AC
Start: 2014-09-14 — End: 2014-09-14
  Administered 2014-09-14: 10 mg via INTRAVENOUS
  Filled 2014-09-14: qty 2

## 2014-09-14 MED ORDER — DIPHENHYDRAMINE HCL 50 MG/ML IJ SOLN
12.5000 mg | Freq: Once | INTRAMUSCULAR | Status: AC
  Administered 2014-09-14: 12.5 mg via INTRAVENOUS
  Filled 2014-09-14: qty 1

## 2014-09-14 MED ORDER — CLOPIDOGREL BISULFATE 75 MG PO TABS: 75.0000 mg | ORAL_TABLET | Freq: Once | ORAL | Status: DC

## 2014-09-14 MED ORDER — PROMETHAZINE HCL 25 MG/ML IJ SOLN
12.5000 mg | Freq: Once | INTRAMUSCULAR | Status: AC
  Administered 2014-09-14: 12.5 mg via INTRAVENOUS
  Filled 2014-09-14: qty 1

## 2014-09-14 MED ORDER — MORPHINE SULFATE 4 MG/ML IJ SOLN
4.0000 mg | Freq: Once | INTRAMUSCULAR | Status: AC
  Administered 2014-09-14: 4 mg via INTRAVENOUS
  Filled 2014-09-14: qty 1

## 2014-09-14 NOTE — ED Notes (Signed)
Initial vital sign/blood pressure was a with large cuff to the left arm, I got reading of 193/132.  I took BP again with smaller cuff at left forearm with result of 220/117. Nurse made aware.

## 2014-09-14 NOTE — Discharge Instructions (Signed)
New your medications as before.  Return to the emergency department if you develop any new and concerning symptoms.   General Headache Without Cause A headache is pain or discomfort felt around the head or neck area. The specific cause of a headache may not be found. There are many causes and types of headaches. A few common ones are:  Tension headaches.  Migraine headaches.  Cluster headaches.  Chronic daily headaches. HOME CARE INSTRUCTIONS   Keep all follow-up appointments with your caregiver or any specialist referral.  Only take over-the-counter or prescription medicines for pain or discomfort as directed by your caregiver.  Lie down in a dark, quiet room when you have a headache.  Keep a headache journal to find out what may trigger your migraine headaches. For example, write down:  What you eat and drink.  How much sleep you get.  Any change to your diet or medicines.  Try massage or other relaxation techniques.  Put ice packs or heat on the head and neck. Use these 3 to 4 times per day for 15 to 20 minutes each time, or as needed.  Limit stress.  Sit up straight, and do not tense your muscles.  Quit smoking if you smoke.  Limit alcohol use.  Decrease the amount of caffeine you drink, or stop drinking caffeine.  Eat and sleep on a regular schedule.  Get 7 to 9 hours of sleep, or as recommended by your caregiver.  Keep lights dim if bright lights bother you and make your headaches worse. SEEK MEDICAL CARE IF:   You have problems with the medicines you were prescribed.  Your medicines are not working.  You have a change from the usual headache.  You have nausea or vomiting. SEEK IMMEDIATE MEDICAL CARE IF:   Your headache becomes severe.  You have a fever.  You have a stiff neck.  You have loss of vision.  You have muscular weakness or loss of muscle control.  You start losing your balance or have trouble walking.  You feel faint or pass  out.  You have severe symptoms that are different from your first symptoms. MAKE SURE YOU:   Understand these instructions.  Will watch your condition.  Will get help right away if you are not doing well or get worse. Document Released: 09/20/2005 Document Revised: 12/13/2011 Document Reviewed: 10/06/2011 [facility] Patient Information 2015 [place], [place]. This information is not intended to replace advice given to you by your health care provider. Make sure you discuss any questions you have with your health care provider.

## 2014-09-14 NOTE — ED Notes (Signed)
Patient stated in waiting room, that she had just developed chest pain. She stated she has pain to center of chest, left arm numbness, back and jaw pain along with head ache. I took patient by wheelchair to triage, then completed ECG as nurse triaged patient. I took patient to room, took vitals, then brought blood samples to lab for nurse Kaila.
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
## 2014-09-14 NOTE — ED Notes (Addendum)
Was here yesterday for HA. Here tonight for HA and also reports CP onset PTA, radiation down L arm, nausea, sob and dizziness. Took night ly meds and tylenol PTA. "Cannot take nig or ASA".  Alert, NAD, calm, interactive, speech clear. HA 10/10. CP 8/10. States, "took her Plavix this am and her warfarin tonight".

## 2014-09-14 NOTE — ED Notes (Signed)
Severe HA since 3pm. "Its got my whole body feeling crazy."

## 2014-09-15 ENCOUNTER — Encounter (HOSPITAL_BASED_OUTPATIENT_CLINIC_OR_DEPARTMENT_OTHER): Payer: Self-pay | Admitting: Emergency Medicine

## 2014-09-15 LAB — URINALYSIS, ROUTINE W REFLEX MICROSCOPIC
Bilirubin Urine: NEGATIVE
GLUCOSE, UA: NEGATIVE mg/dL
HGB URINE DIPSTICK: NEGATIVE
Ketones, ur: NEGATIVE mg/dL
Leukocytes, UA: NEGATIVE
Nitrite: NEGATIVE
PH: 5.5 (ref 5.0–8.0)
Protein, ur: NEGATIVE mg/dL
SPECIFIC GRAVITY, URINE: 1.011 (ref 1.005–1.030)
Urobilinogen, UA: 0.2 mg/dL (ref 0.0–1.0)

## 2014-09-15 LAB — TROPONIN I: Troponin I: <0.3 ng/mL (ref <0.30)

## 2014-09-15 LAB — RAPID URINE DRUG SCREEN, HOSP PERFORMED
AMPHETAMINES: NOT DETECTED
BENZODIAZEPINES: NOT DETECTED
Barbiturates: NOT DETECTED
Cocaine: NOT DETECTED
OPIATES: NOT DETECTED
Tetrahydrocannabinol: NOT DETECTED

## 2014-09-15 MED ORDER — PROMETHAZINE HCL 12.5 MG RE SUPP: 12.5000 mg | Freq: Three times a day (TID) | RECTAL | Status: DC | PRN

## 2014-09-15 MED ORDER — DIVALPROEX SODIUM 250 MG PO DR TAB
500.0000 mg | DELAYED_RELEASE_TABLET | Freq: Two times a day (BID) | ORAL | Status: DC
  Administered 2014-09-15: 500 mg via ORAL
  Filled 2014-09-15: qty 2

## 2014-09-16 ENCOUNTER — Encounter (HOSPITAL_COMMUNITY): Payer: Self-pay | Admitting: Emergency Medicine

## 2014-09-16 ENCOUNTER — Emergency Department (HOSPITAL_COMMUNITY)
Admission: EM | Admit: 2014-09-16 | Discharge: 2014-09-16 | Disposition: A | Discharge status: Home / Self Care | Payer: Medicare Other | Attending: Emergency Medicine | Admitting: Emergency Medicine

## 2014-09-16 DIAGNOSIS — G40909 Epilepsy, unspecified, not intractable, without status epilepticus: Secondary | ICD-10-CM | POA: Diagnosis not present

## 2014-09-16 DIAGNOSIS — E785 Hyperlipidemia, unspecified: Secondary | ICD-10-CM | POA: Insufficient documentation

## 2014-09-16 DIAGNOSIS — G8929 Other chronic pain: Secondary | ICD-10-CM | POA: Diagnosis not present

## 2014-09-16 DIAGNOSIS — I4891 Unspecified atrial fibrillation: Secondary | ICD-10-CM | POA: Diagnosis not present

## 2014-09-16 DIAGNOSIS — R011 Cardiac murmur, unspecified: Secondary | ICD-10-CM | POA: Diagnosis not present

## 2014-09-16 DIAGNOSIS — F419 Anxiety disorder, unspecified: Secondary | ICD-10-CM | POA: Insufficient documentation

## 2014-09-16 DIAGNOSIS — Z862 Personal history of diseases of the blood and blood-forming organs and certain disorders involving the immune mechanism: Secondary | ICD-10-CM | POA: Insufficient documentation

## 2014-09-16 DIAGNOSIS — Z87891 Personal history of nicotine dependence: Secondary | ICD-10-CM | POA: Diagnosis not present

## 2014-09-16 DIAGNOSIS — G44209 Tension-type headache, unspecified, not intractable: Secondary | ICD-10-CM | POA: Insufficient documentation

## 2014-09-16 DIAGNOSIS — J45909 Unspecified asthma, uncomplicated: Secondary | ICD-10-CM | POA: Insufficient documentation

## 2014-09-16 DIAGNOSIS — Z8719 Personal history of other diseases of the digestive system: Secondary | ICD-10-CM | POA: Diagnosis not present

## 2014-09-16 DIAGNOSIS — M1990 Unspecified osteoarthritis, unspecified site: Secondary | ICD-10-CM | POA: Insufficient documentation

## 2014-09-16 DIAGNOSIS — Z9889 Other specified postprocedural states: Secondary | ICD-10-CM | POA: Diagnosis not present

## 2014-09-16 DIAGNOSIS — R51 Headache: Secondary | ICD-10-CM | POA: Diagnosis present

## 2014-09-16 DIAGNOSIS — I10 Essential (primary) hypertension: Secondary | ICD-10-CM | POA: Insufficient documentation

## 2014-09-16 DIAGNOSIS — Z7902 Long term (current) use of antithrombotics/antiplatelets: Secondary | ICD-10-CM | POA: Diagnosis not present

## 2014-09-16 DIAGNOSIS — Z8673 Personal history of transient ischemic attack (TIA), and cerebral infarction without residual deficits: Secondary | ICD-10-CM | POA: Diagnosis not present

## 2014-09-16 DIAGNOSIS — I209 Angina pectoris, unspecified: Secondary | ICD-10-CM | POA: Insufficient documentation

## 2014-09-16 DIAGNOSIS — Z79899 Other long term (current) drug therapy: Secondary | ICD-10-CM | POA: Insufficient documentation

## 2014-09-16 DIAGNOSIS — Z7901 Long term (current) use of anticoagulants: Secondary | ICD-10-CM | POA: Diagnosis not present

## 2014-09-16 MED ORDER — SODIUM CHLORIDE 0.9 % IV BOLUS (SEPSIS)
500.0000 mL | Freq: Once | INTRAVENOUS | Status: AC
  Administered 2014-09-16: 500 mL via INTRAVENOUS

## 2014-09-16 MED ORDER — ONDANSETRON HCL 4 MG/2ML IJ SOLN
4.0000 mg | Freq: Once | INTRAMUSCULAR | Status: AC
  Administered 2014-09-16: 4 mg via INTRAVENOUS
  Filled 2014-09-16: qty 2

## 2014-09-16 MED ORDER — PROCHLORPERAZINE EDISYLATE 5 MG/ML IJ SOLN
10.0000 mg | Freq: Once | INTRAMUSCULAR | Status: AC
  Administered 2014-09-16: 10 mg via INTRAVENOUS
  Filled 2014-09-16: qty 2

## 2014-09-16 MED ORDER — MORPHINE SULFATE 4 MG/ML IJ SOLN
4.0000 mg | Freq: Once | INTRAMUSCULAR | Status: AC
  Administered 2014-09-16: 4 mg via INTRAVENOUS
  Filled 2014-09-16: qty 1

## 2014-09-16 NOTE — ED Notes (Signed)
Pt has a hx of hypertension. Pt has had a headache x3 days . Pt just started complaining of chest pressure. Pt was seen at [facility] with similar complaints.  EMS 199/108 EMS states no neuro deficiencies. Patient is on blood thinners.
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
## 2014-09-16 NOTE — Discharge Instructions (Signed)
Your MRI is ordered for the morning - come back at the correct time - nurse will tell you when to come back - your doctor will have to follow up your results with you.  Please call your doctor for a followup appointment within 24-48 hours. When you talk to your doctor please let them know that you were seen in the emergency department and have them acquire all of your records so that they can discuss the findings with you and formulate a treatment plan to fully care for your new and ongoing problems.

## 2014-09-17 ENCOUNTER — Inpatient Hospital Stay (HOSPITAL_COMMUNITY): Admit: 2014-09-17 | Payer: Medicare Other

## 2014-10-15 ENCOUNTER — Encounter (HOSPITAL_BASED_OUTPATIENT_CLINIC_OR_DEPARTMENT_OTHER): Payer: Self-pay | Admitting: *Deleted

## 2014-10-15 ENCOUNTER — Emergency Department (HOSPITAL_BASED_OUTPATIENT_CLINIC_OR_DEPARTMENT_OTHER): Payer: Medicare Other

## 2014-10-15 ENCOUNTER — Emergency Department (HOSPITAL_BASED_OUTPATIENT_CLINIC_OR_DEPARTMENT_OTHER)
Admission: EM | Admit: 2014-10-15 | Discharge: 2014-10-16 | Disposition: A | Discharge status: Home / Self Care | Payer: Medicare Other | Attending: Emergency Medicine | Admitting: Emergency Medicine

## 2014-10-15 DIAGNOSIS — M1990 Unspecified osteoarthritis, unspecified site: Secondary | ICD-10-CM | POA: Insufficient documentation

## 2014-10-15 DIAGNOSIS — R079 Chest pain, unspecified: Secondary | ICD-10-CM | POA: Diagnosis not present

## 2014-10-15 DIAGNOSIS — I209 Angina pectoris, unspecified: Secondary | ICD-10-CM | POA: Diagnosis not present

## 2014-10-15 DIAGNOSIS — Z8673 Personal history of transient ischemic attack (TIA), and cerebral infarction without residual deficits: Secondary | ICD-10-CM | POA: Insufficient documentation

## 2014-10-15 DIAGNOSIS — Z8619 Personal history of other infectious and parasitic diseases: Secondary | ICD-10-CM | POA: Diagnosis not present

## 2014-10-15 DIAGNOSIS — F419 Anxiety disorder, unspecified: Secondary | ICD-10-CM | POA: Diagnosis not present

## 2014-10-15 DIAGNOSIS — I10 Essential (primary) hypertension: Secondary | ICD-10-CM | POA: Insufficient documentation

## 2014-10-15 DIAGNOSIS — Z79899 Other long term (current) drug therapy: Secondary | ICD-10-CM | POA: Diagnosis not present

## 2014-10-15 DIAGNOSIS — J45901 Unspecified asthma with (acute) exacerbation: Secondary | ICD-10-CM | POA: Diagnosis not present

## 2014-10-15 DIAGNOSIS — Z862 Personal history of diseases of the blood and blood-forming organs and certain disorders involving the immune mechanism: Secondary | ICD-10-CM | POA: Insufficient documentation

## 2014-10-15 DIAGNOSIS — R51 Headache: Secondary | ICD-10-CM

## 2014-10-15 DIAGNOSIS — Z7902 Long term (current) use of antithrombotics/antiplatelets: Secondary | ICD-10-CM | POA: Diagnosis not present

## 2014-10-15 DIAGNOSIS — G8929 Other chronic pain: Secondary | ICD-10-CM | POA: Insufficient documentation

## 2014-10-15 DIAGNOSIS — Z9889 Other specified postprocedural states: Secondary | ICD-10-CM | POA: Insufficient documentation

## 2014-10-15 DIAGNOSIS — K219 Gastro-esophageal reflux disease without esophagitis: Secondary | ICD-10-CM | POA: Insufficient documentation

## 2014-10-15 DIAGNOSIS — E785 Hyperlipidemia, unspecified: Secondary | ICD-10-CM | POA: Insufficient documentation

## 2014-10-15 DIAGNOSIS — Z87828 Personal history of other (healed) physical injury and trauma: Secondary | ICD-10-CM | POA: Diagnosis not present

## 2014-10-15 DIAGNOSIS — R519 Headache, unspecified: Secondary | ICD-10-CM

## 2014-10-15 DIAGNOSIS — R011 Cardiac murmur, unspecified: Secondary | ICD-10-CM | POA: Insufficient documentation

## 2014-10-15 DIAGNOSIS — I4891 Unspecified atrial fibrillation: Secondary | ICD-10-CM | POA: Insufficient documentation

## 2014-10-15 DIAGNOSIS — Z87891 Personal history of nicotine dependence: Secondary | ICD-10-CM | POA: Diagnosis not present

## 2014-10-15 DIAGNOSIS — G40909 Epilepsy, unspecified, not intractable, without status epilepticus: Secondary | ICD-10-CM | POA: Insufficient documentation

## 2014-10-15 LAB — CBC
HCT: 38.7 % (ref 36.0–46.0)
Hemoglobin: 12.8 g/dL (ref 12.0–15.0)
MCH: 25.3 pg — ABNORMAL LOW (ref 26.0–34.0)
MCHC: 33.1 g/dL (ref 30.0–36.0)
MCV: 76.6 fL — ABNORMAL LOW (ref 78.0–100.0)
Platelets: 274 10*3/uL (ref 150–400)
RBC: 5.05 MIL/uL (ref 3.87–5.11)
RDW: 15.1 % (ref 11.5–15.5)
WBC: 6.8 10*3/uL (ref 4.0–10.5)

## 2014-10-15 LAB — COMPREHENSIVE METABOLIC PANEL
ALT: 29 U/L (ref 0–35)
AST: 34 U/L (ref 0–37)
Albumin: 4.3 g/dL (ref 3.5–5.2)
Alkaline Phosphatase: 72 U/L (ref 39–117)
Anion gap: 8 (ref 5–15)
BUN: 14 mg/dL (ref 6–23)
CALCIUM: 9 mg/dL (ref 8.4–10.5)
CO2: 26 mmol/L (ref 19–32)
CREATININE: 0.94 mg/dL (ref 0.50–1.10)
Chloride: 108 mEq/L (ref 96–112)
GFR calc non Af Amer: 65 mL/min — ABNORMAL LOW (ref >90)
GFR, EST AFRICAN AMERICAN: 75 mL/min — AB (ref >90)
GLUCOSE: 167 mg/dL — AB (ref 70–99)
Potassium: 3.1 mmol/L — ABNORMAL LOW (ref 3.5–5.1)
SODIUM: 142 mmol/L (ref 135–145)
TOTAL PROTEIN: 7.8 g/dL (ref 6.0–8.3)
Total Bilirubin: 0.7 mg/dL (ref 0.3–1.2)

## 2014-10-15 LAB — TROPONIN I: Troponin I: <0.03 ng/mL (ref <0.031)

## 2014-10-15 MED ORDER — OXYCODONE-ACETAMINOPHEN 5-325 MG PO TABS
1.0000 | ORAL_TABLET | Freq: Once | ORAL | Status: AC
  Administered 2014-10-15: 1 via ORAL
  Filled 2014-10-15: qty 1

## 2014-10-15 MED ORDER — METOCLOPRAMIDE HCL 5 MG/ML IJ SOLN
10.0000 mg | Freq: Once | INTRAMUSCULAR | Status: AC
  Administered 2014-10-16: 10 mg via INTRAVENOUS
  Filled 2014-10-15: qty 2

## 2014-10-15 MED ORDER — DIPHENHYDRAMINE HCL 50 MG/ML IJ SOLN
25.0000 mg | Freq: Once | INTRAMUSCULAR | Status: AC
  Administered 2014-10-16: 25 mg via INTRAVENOUS
  Filled 2014-10-15: qty 1

## 2014-10-15 NOTE — ED Notes (Signed)
Pt c/o CP which radiates down left arm  x 2 days also c/o SOB dizziness
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
## 2014-10-16 DIAGNOSIS — R079 Chest pain, unspecified: Secondary | ICD-10-CM | POA: Diagnosis not present

## 2014-10-16 LAB — TROPONIN I: Troponin I: <0.03 ng/mL (ref <0.031)

## 2014-10-16 NOTE — Discharge Instructions (Signed)
Hypertension °Hypertension, commonly called high blood pressure, is when the force of blood pumping through your arteries is too strong. Your arteries are the blood vessels that carry blood from your heart throughout your body. A blood pressure reading consists of a higher number over a lower number, such as 110/72. The higher number (systolic) is the pressure inside your arteries when your heart pumps. The lower number (diastolic) is the pressure inside your arteries when your heart relaxes. Ideally you want your blood pressure below 120/80. °Hypertension forces your heart to work harder to pump blood. Your arteries may become narrow or stiff. Having hypertension puts you at risk for heart disease, stroke, and other problems.  °RISK FACTORS °Some risk factors for high blood pressure are controllable. Others are not.  °Risk factors you cannot control include:  °· Race. You may be at higher risk if you are African American. °· Age. Risk increases with age. °· Gender. Men are at higher risk than women before age 45 years. After age 65, women are at higher risk than men. °Risk factors you can control include: °· Not getting enough exercise or physical activity. °· Being overweight. °· Getting too much fat, sugar, calories, or salt in your diet. °· Drinking too much alcohol. °SIGNS AND SYMPTOMS °Hypertension does not usually cause signs or symptoms. Extremely high blood pressure (hypertensive crisis) may cause headache, anxiety, shortness of breath, and nosebleed. °DIAGNOSIS  °To check if you have hypertension, your health care provider will measure your blood pressure while you are seated, with your arm held at the level of your heart. It should be measured at least twice using the same arm. Certain conditions can cause a difference in blood pressure between your right and left arms. A blood pressure reading that is higher than normal on one occasion does not mean that you need treatment. If one blood pressure reading  is high, ask your health care provider about having it checked again. °TREATMENT  °Treating high blood pressure includes making lifestyle changes and possibly taking medicine. Living a healthy lifestyle can help lower high blood pressure. You may need to change some of your habits. °Lifestyle changes may include: °· Following the DASH diet. This diet is high in fruits, vegetables, and whole grains. It is low in salt, red meat, and added sugars. °· Getting at least 2½ hours of brisk physical activity every week. °· Losing weight if necessary. °· Not smoking. °· Limiting alcoholic beverages. °· Learning ways to reduce stress. ° If lifestyle changes are not enough to get your blood pressure under control, your health care provider may prescribe medicine. You may need to take more than one. Work closely with your health care provider to understand the risks and benefits. °HOME CARE INSTRUCTIONS °· Have your blood pressure rechecked as directed by your health care provider.   °· Take medicines only as directed by your health care provider. Follow the directions carefully. Blood pressure medicines must be taken as prescribed. The medicine does not work as well when you skip doses. Skipping doses also puts you at risk for problems.   °· Do not smoke.   °· Monitor your blood pressure at home as directed by your health care provider.  °SEEK MEDICAL CARE IF:  °· You think you are having a reaction to medicines taken. °· You have recurrent headaches or feel dizzy. °· You have swelling in your ankles. °· You have trouble with your vision. °SEEK IMMEDIATE MEDICAL CARE IF: °· You develop a severe headache or confusion. °·   You have unusual weakness, numbness, or feel faint.  You have severe chest or abdominal pain.  You vomit repeatedly.  You have trouble breathing. MAKE SURE YOU:   Understand these instructions.  Will watch your condition.  Will get help right away if you are not doing well or get worse. Document  Released: 09/20/2005 Document Revised: 02/04/2014 Document Reviewed: 07/13/2013 [facility] Patient Information 2015 [place], [place]. This information is not intended to replace advice given to you by your health care provider. Make sure you discuss any questions you have with your health care provider. General Headache Without Cause A headache is pain or discomfort felt around the head or neck area. The specific cause of a headache may not be found. There are many causes and types of headaches. A few common ones are:  Tension headaches.  Migraine headaches.  Cluster headaches.  Chronic daily headaches. HOME CARE INSTRUCTIONS   Keep all follow-up appointments with your caregiver or any specialist referral.  Only take over-the-counter or prescription medicines for pain or discomfort as directed by your caregiver.  Lie down in a dark, quiet room when you have a headache.  Keep a headache journal to find out what may trigger your migraine headaches. For example, write down:  What you eat and drink.  How much sleep you get.  Any change to your diet or medicines.  Try massage or other relaxation techniques.  Put ice packs or heat on the head and neck. Use these 3 to 4 times per day for 15 to 20 minutes each time, or as needed.  Limit stress.  Sit up straight, and do not tense your muscles.  Quit smoking if you smoke.  Limit alcohol use.  Decrease the amount of caffeine you drink, or stop drinking caffeine.  Eat and sleep on a regular schedule.  Get 7 to 9 hours of sleep, or as recommended by your caregiver.  Keep lights dim if bright lights bother you and make your headaches worse. SEEK MEDICAL CARE IF:   You have problems with the medicines you were prescribed.  Your medicines are not working.  You have a change from the usual headache.  You have nausea or vomiting. SEEK IMMEDIATE MEDICAL CARE IF:   Your headache becomes severe.  You have a fever.  You have a  stiff neck.  You have loss of vision.  You have muscular weakness or loss of muscle control.  You start losing your balance or have trouble walking.  You feel faint or pass out.  You have severe symptoms that are different from your first symptoms. MAKE SURE YOU:   Understand these instructions.  Will watch your condition.  Will get help right away if you are not doing well or get worse. Document Released: 09/20/2005 Document Revised: 12/13/2011 Document Reviewed: 10/06/2011 [facility] Patient Information 2015 [place], [place]. This information is not intended to replace advice given to you by your health care provider. Make sure you discuss any questions you have with your health care provider. Chest Pain (Nonspecific) It is often hard to give a specific diagnosis for the cause of chest pain. There is always a chance that your pain could be related to something serious, such as a heart attack or a blood clot in the lungs. You need to follow up with your health care provider for further evaluation. CAUSES   Heartburn.  Pneumonia or bronchitis.  Anxiety or stress.  Inflammation around your heart (pericarditis) or lung (pleuritis or pleurisy).  A blood  clot in the lung.  A collapsed lung (pneumothorax). It can develop suddenly on its own (spontaneous pneumothorax) or from trauma to the chest.  Shingles infection (herpes zoster virus). The chest wall is composed of bones, muscles, and cartilage. Any of these can be the source of the pain.  The bones can be bruised by injury.  The muscles or cartilage can be strained by coughing or overwork.  The cartilage can be affected by inflammation and become sore (costochondritis). DIAGNOSIS  Lab tests or other studies may be needed to find the cause of your pain. Your health care provider may have you take a test called an ambulatory electrocardiogram (ECG). An ECG records your heartbeat patterns over a 24-hour period. You may also have  other tests, such as:  Transthoracic echocardiogram (TTE). During echocardiography, sound waves are used to evaluate how blood flows through your heart.  Transesophageal echocardiogram (TEE).  Cardiac monitoring. This allows your health care provider to monitor your heart rate and rhythm in real time.  Holter monitor. This is a portable device that records your heartbeat and can help diagnose heart arrhythmias. It allows your health care provider to track your heart activity for several days, if needed.  Stress tests by exercise or by giving medicine that makes the heart beat faster. TREATMENT   Treatment depends on what may be causing your chest pain. Treatment may include:  Acid blockers for heartburn.  Anti-inflammatory medicine.  Pain medicine for inflammatory conditions.  Antibiotics if an infection is present.  You may be advised to change lifestyle habits. This includes stopping smoking and avoiding alcohol, caffeine, and chocolate.  You may be advised to keep your head raised (elevated) when sleeping. This reduces the chance of acid going backward from your stomach into your esophagus. Most of the time, nonspecific chest pain will improve within 2-3 days with rest and mild pain medicine.  HOME CARE INSTRUCTIONS   If antibiotics were prescribed, take them as directed. Finish them even if you start to feel better.  For the next few days, avoid physical activities that bring on chest pain. Continue physical activities as directed.  Do not use any tobacco products, including cigarettes, chewing tobacco, or electronic cigarettes.  Avoid drinking alcohol.  Only take medicine as directed by your health care provider.  Follow your health care provider's suggestions for further testing if your chest pain does not go away.  Keep any follow-up appointments you made. If you do not go to an appointment, you could develop lasting (chronic) problems with pain. If there is any  problem keeping an appointment, call to reschedule. SEEK MEDICAL CARE IF:   Your chest pain does not go away, even after treatment.  You have a rash with blisters on your chest.  You have a fever. SEEK IMMEDIATE MEDICAL CARE IF:   You have increased chest pain or pain that spreads to your arm, neck, jaw, back, or abdomen.  You have shortness of breath.  You have an increasing cough, or you cough up blood.  You have severe back or abdominal pain.  You feel nauseous or vomit.  You have severe weakness.  You faint.  You have chills. This is an emergency. Do not wait to see if the pain will go away. Get medical help at once. Call your local emergency services (911 in [place] Do not drive yourself to the hospital. MAKE SURE YOU:   Understand these instructions.  Will watch your condition.  Will get help right away  if you are not doing well or get worse. Document Released: 06/30/2005 Document Revised: 09/25/2013 Document Reviewed: 04/25/2008 [facility] Patient Information 2015 [place], [place]. This information is not intended to replace advice given to you by your health care provider. Make sure you discuss any questions you have with your health care provider.

## 2014-10-21 ENCOUNTER — Encounter (HOSPITAL_BASED_OUTPATIENT_CLINIC_OR_DEPARTMENT_OTHER): Payer: Self-pay | Admitting: *Deleted

## 2014-10-21 ENCOUNTER — Emergency Department (HOSPITAL_BASED_OUTPATIENT_CLINIC_OR_DEPARTMENT_OTHER): Payer: Medicare Other

## 2014-10-21 ENCOUNTER — Emergency Department (HOSPITAL_BASED_OUTPATIENT_CLINIC_OR_DEPARTMENT_OTHER)
Admission: EM | Admit: 2014-10-21 | Discharge: 2014-10-22 | Disposition: A | Discharge status: Home / Self Care | Payer: Medicare Other | Attending: Emergency Medicine | Admitting: Emergency Medicine

## 2014-10-21 DIAGNOSIS — M1990 Unspecified osteoarthritis, unspecified site: Secondary | ICD-10-CM | POA: Diagnosis not present

## 2014-10-21 DIAGNOSIS — G8929 Other chronic pain: Secondary | ICD-10-CM | POA: Insufficient documentation

## 2014-10-21 DIAGNOSIS — Z7901 Long term (current) use of anticoagulants: Secondary | ICD-10-CM | POA: Diagnosis not present

## 2014-10-21 DIAGNOSIS — I4891 Unspecified atrial fibrillation: Secondary | ICD-10-CM | POA: Diagnosis not present

## 2014-10-21 DIAGNOSIS — Z8673 Personal history of transient ischemic attack (TIA), and cerebral infarction without residual deficits: Secondary | ICD-10-CM | POA: Insufficient documentation

## 2014-10-21 DIAGNOSIS — R112 Nausea with vomiting, unspecified: Secondary | ICD-10-CM | POA: Diagnosis not present

## 2014-10-21 DIAGNOSIS — R51 Headache: Secondary | ICD-10-CM | POA: Insufficient documentation

## 2014-10-21 DIAGNOSIS — R011 Cardiac murmur, unspecified: Secondary | ICD-10-CM | POA: Diagnosis not present

## 2014-10-21 DIAGNOSIS — Z79899 Other long term (current) drug therapy: Secondary | ICD-10-CM | POA: Diagnosis not present

## 2014-10-21 DIAGNOSIS — F419 Anxiety disorder, unspecified: Secondary | ICD-10-CM | POA: Insufficient documentation

## 2014-10-21 DIAGNOSIS — Z7902 Long term (current) use of antithrombotics/antiplatelets: Secondary | ICD-10-CM | POA: Insufficient documentation

## 2014-10-21 DIAGNOSIS — I10 Essential (primary) hypertension: Secondary | ICD-10-CM | POA: Diagnosis not present

## 2014-10-21 DIAGNOSIS — E785 Hyperlipidemia, unspecified: Secondary | ICD-10-CM | POA: Insufficient documentation

## 2014-10-21 DIAGNOSIS — M542 Cervicalgia: Secondary | ICD-10-CM | POA: Insufficient documentation

## 2014-10-21 DIAGNOSIS — J45909 Unspecified asthma, uncomplicated: Secondary | ICD-10-CM | POA: Insufficient documentation

## 2014-10-21 DIAGNOSIS — Z87828 Personal history of other (healed) physical injury and trauma: Secondary | ICD-10-CM | POA: Diagnosis not present

## 2014-10-21 DIAGNOSIS — R079 Chest pain, unspecified: Secondary | ICD-10-CM | POA: Diagnosis not present

## 2014-10-21 DIAGNOSIS — R519 Headache, unspecified: Secondary | ICD-10-CM

## 2014-10-21 LAB — BASIC METABOLIC PANEL
ANION GAP: 3 — AB (ref 5–15)
BUN: 15 mg/dL (ref 6–23)
CO2: 32 mmol/L (ref 19–32)
Calcium: 8.5 mg/dL (ref 8.4–10.5)
Chloride: 106 mEq/L (ref 96–112)
Creatinine, Ser: 1.04 mg/dL (ref 0.50–1.10)
GFR calc Af Amer: 66 mL/min — ABNORMAL LOW (ref >90)
GFR, EST NON AFRICAN AMERICAN: 57 mL/min — AB (ref >90)
GLUCOSE: 95 mg/dL (ref 70–99)
Potassium: 3.5 mmol/L (ref 3.5–5.1)
Sodium: 141 mmol/L (ref 135–145)

## 2014-10-21 LAB — CBC
HEMATOCRIT: 35.2 % — AB (ref 36.0–46.0)
HEMOGLOBIN: 11.1 g/dL — AB (ref 12.0–15.0)
MCH: 24.9 pg — ABNORMAL LOW (ref 26.0–34.0)
MCHC: 31.5 g/dL (ref 30.0–36.0)
MCV: 78.9 fL (ref 78.0–100.0)
PLATELETS: 240 10*3/uL (ref 150–400)
RBC: 4.46 MIL/uL (ref 3.87–5.11)
RDW: 15.1 % (ref 11.5–15.5)
WBC: 4.4 10*3/uL (ref 4.0–10.5)

## 2014-10-21 LAB — TROPONIN I: Troponin I: <0.03 ng/mL (ref <0.031)

## 2014-10-21 LAB — PROTIME-INR
INR: 1.98 — ABNORMAL HIGH (ref 0.00–1.49)
PROTHROMBIN TIME: 22.5 s — AB (ref 11.6–15.2)

## 2014-10-21 MED ORDER — HYDRALAZINE HCL 25 MG PO TABS
25.0000 mg | ORAL_TABLET | Freq: Three times a day (TID) | ORAL | Status: DC
  Filled 2014-10-21: qty 1

## 2014-10-21 MED ORDER — DIPHENHYDRAMINE HCL 50 MG/ML IJ SOLN
25.0000 mg | Freq: Once | INTRAMUSCULAR | Status: AC
  Administered 2014-10-21: 25 mg via INTRAVENOUS
  Filled 2014-10-21: qty 1

## 2014-10-21 MED ORDER — GABAPENTIN 300 MG PO CAPS: 300.0000 mg | ORAL_CAPSULE | Freq: Three times a day (TID) | ORAL | Status: DC

## 2014-10-21 MED ORDER — CLOPIDOGREL BISULFATE 75 MG PO TABS: 75.0000 mg | ORAL_TABLET | Freq: Every day | ORAL | Status: DC

## 2014-10-21 MED ORDER — DILTIAZEM HCL ER COATED BEADS 120 MG PO CP24: 240.0000 mg | ORAL_CAPSULE | Freq: Every day | ORAL | Status: DC

## 2014-10-21 MED ORDER — MAGNESIUM SULFATE 2 GM/50ML IV SOLN
2.0000 g | Freq: Once | INTRAVENOUS | Status: AC
  Administered 2014-10-21: 2 g via INTRAVENOUS
  Filled 2014-10-21: qty 50

## 2014-10-21 MED ORDER — WARFARIN SODIUM 5 MG PO TABS: 5.0000 mg | ORAL_TABLET | Freq: Once | ORAL | Status: DC

## 2014-10-21 MED ORDER — KETOROLAC TROMETHAMINE 30 MG/ML IJ SOLN
30.0000 mg | Freq: Once | INTRAMUSCULAR | Status: AC
  Administered 2014-10-21: 30 mg via INTRAVENOUS
  Filled 2014-10-21: qty 1

## 2014-10-21 MED ORDER — METOCLOPRAMIDE HCL 5 MG/ML IJ SOLN
10.0000 mg | Freq: Once | INTRAMUSCULAR | Status: AC
  Administered 2014-10-21: 10 mg via INTRAVENOUS
  Filled 2014-10-21: qty 2

## 2014-10-21 NOTE — ED Notes (Addendum)
Dizziness, lightheaded last night. Headache. She vomited last night. This am she continued with dizziness when she woke up. Chest pain started 10 minutes ago.

## 2014-10-21 NOTE — ED Notes (Signed)
Patient transported to CT 

## 2014-10-21 NOTE — ED Notes (Signed)
Patient transported to X-ray 
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
## 2014-10-22 DIAGNOSIS — R079 Chest pain, unspecified: Secondary | ICD-10-CM | POA: Diagnosis not present

## 2014-10-22 LAB — TROPONIN I: Troponin I: <0.03 ng/mL (ref <0.031)

## 2014-10-22 MED ORDER — WARFARIN SODIUM 5 MG PO TABS
5.0000 mg | ORAL_TABLET | Freq: Once | ORAL | Status: AC
  Administered 2014-10-22: 5 mg via ORAL
  Filled 2014-10-22: qty 1

## 2014-10-22 NOTE — ED Provider Notes (Signed)
  Physical Exam  BP 130/67 mmHg  Pulse 65  Temp(Src) 97.9 F (36.6 C) (Oral)  Resp 17  Ht 5\' 6"  (1.676 m)  Wt 240 lb (108.863 kg)  BMI 38.76 kg/m2  SpO2 94%  Physical Exam  Cardiovascular:  Normal cardiac exam, no murmurs rubs or gallops, normal pulses, no tachycardia or arrhythmia  Pulmonary/Chest:  No wheezing rhonchi or rales, normal work of breathing, speaks in full sentences    ED Course  Procedures  MDM The patient was reexamined, she has not had any significant pain other than fleeting pain while she is laying down, she denies any exertional symptoms, the EKGs were reviewed over the last 2 days as well as over the last several years dating back to her totally normal heart catheterization in 2012. I find abnormal T waves on almost all of them including the inferior leads and some of the precordial leads. Her repeat EKG at 10:12 AM this morning shows no significant changes, she is pain-free, she is low risk given recent heart catheterizations in the last several years and has a cardiologist with whom she can follow-up with in the community and already has an appointment within the coming 1-2 weeks. At this time the patient appears stable for discharge. She has had multiple troponin which of all been normal.  ED ECG REPORT  I personally interpreted this EKG   Date: 10/22/2014   Rate: 73  Rhythm: normal sinus rhythm  QRS Axis: normal  Intervals: normal  ST/T Wave abnormalities: nonspecific T wave changes  Conduction Disutrbances:none  Narrative Interpretation:   Old EKG Reviewed: unchanged       Audrey Acosta, MD 10/22/14 1019

## 2014-10-22 NOTE — ED Notes (Signed)
Report received, pt care assumed. Pt sleeping with resp deep and regular.

## 2014-10-22 NOTE — Discharge Instructions (Signed)
Your caregiver has diagnosed you as having chest pain that is nonspecific for one problem. This means that after looking at you and examining you and ordering tests (such as blood work, chest x-rays and EKG), your caregiver does not believe that the problem is serious enough to need watching in the hospital. This judgment is often made after testing shows no acute heart attack and you are at low risk for sudden acute heart condition. Chest pain comes from many different causes. ° °Seek immediate medical attention if: ° °You have severe chest pain, especially if the pain is crushing or pressure-like and spreads to the arms, back, neck, or jaw, or if you have sweating, nausea, shortness of breath. This is an emergency. Don't wait to see if the pain will go away. Get medical help at once. Call 911 immediately. Do not drive herself to the hospital. ° °Your chest pain gets worse and does not go away with rest. ° °You have an attack of chest pain lasting longer than usual, despite rest and treatment with the medications your caregiver has prescribed ° °You awaken from sleep with chest pain or shortness of breath. ° °You feel faint or dizzy ° °You have chest pain not typical of your usual pain for which you originally saw your caregiver. ° °You must have a repeat evaluation within 24 hours for a recheck of your heart.  Please call your doctor this morning to schedule this appointment. If you do not have a family doctor, please see the list of doctors below. ° ° °

## 2014-11-12 ENCOUNTER — Emergency Department (HOSPITAL_BASED_OUTPATIENT_CLINIC_OR_DEPARTMENT_OTHER)
Admission: EM | Admit: 2014-11-12 | Discharge: 2014-11-12 | Disposition: A | Discharge status: Home / Self Care | Payer: Medicare Other | Source: Home / Self Care | Attending: Emergency Medicine | Admitting: Emergency Medicine

## 2014-11-12 ENCOUNTER — Encounter (HOSPITAL_BASED_OUTPATIENT_CLINIC_OR_DEPARTMENT_OTHER): Payer: Self-pay | Admitting: *Deleted

## 2014-11-12 ENCOUNTER — Emergency Department (HOSPITAL_BASED_OUTPATIENT_CLINIC_OR_DEPARTMENT_OTHER): Payer: Medicare Other

## 2014-11-12 DIAGNOSIS — R011 Cardiac murmur, unspecified: Secondary | ICD-10-CM | POA: Insufficient documentation

## 2014-11-12 DIAGNOSIS — Z87891 Personal history of nicotine dependence: Secondary | ICD-10-CM | POA: Insufficient documentation

## 2014-11-12 DIAGNOSIS — J45909 Unspecified asthma, uncomplicated: Secondary | ICD-10-CM

## 2014-11-12 DIAGNOSIS — I4891 Unspecified atrial fibrillation: Secondary | ICD-10-CM | POA: Insufficient documentation

## 2014-11-12 DIAGNOSIS — F419 Anxiety disorder, unspecified: Secondary | ICD-10-CM | POA: Insufficient documentation

## 2014-11-12 DIAGNOSIS — R1084 Generalized abdominal pain: Secondary | ICD-10-CM

## 2014-11-12 DIAGNOSIS — Z79899 Other long term (current) drug therapy: Secondary | ICD-10-CM | POA: Insufficient documentation

## 2014-11-12 DIAGNOSIS — Z7901 Long term (current) use of anticoagulants: Secondary | ICD-10-CM | POA: Insufficient documentation

## 2014-11-12 DIAGNOSIS — I10 Essential (primary) hypertension: Secondary | ICD-10-CM | POA: Insufficient documentation

## 2014-11-12 DIAGNOSIS — N390 Urinary tract infection, site not specified: Secondary | ICD-10-CM | POA: Insufficient documentation

## 2014-11-12 DIAGNOSIS — I209 Angina pectoris, unspecified: Secondary | ICD-10-CM

## 2014-11-12 DIAGNOSIS — G43909 Migraine, unspecified, not intractable, without status migrainosus: Secondary | ICD-10-CM | POA: Insufficient documentation

## 2014-11-12 DIAGNOSIS — Z7902 Long term (current) use of antithrombotics/antiplatelets: Secondary | ICD-10-CM

## 2014-11-12 DIAGNOSIS — Z87828 Personal history of other (healed) physical injury and trauma: Secondary | ICD-10-CM

## 2014-11-12 DIAGNOSIS — Z862 Personal history of diseases of the blood and blood-forming organs and certain disorders involving the immune mechanism: Secondary | ICD-10-CM | POA: Insufficient documentation

## 2014-11-12 DIAGNOSIS — K219 Gastro-esophageal reflux disease without esophagitis: Secondary | ICD-10-CM | POA: Insufficient documentation

## 2014-11-12 DIAGNOSIS — G8929 Other chronic pain: Secondary | ICD-10-CM

## 2014-11-12 DIAGNOSIS — Z8673 Personal history of transient ischemic attack (TIA), and cerebral infarction without residual deficits: Secondary | ICD-10-CM | POA: Insufficient documentation

## 2014-11-12 DIAGNOSIS — Z8619 Personal history of other infectious and parasitic diseases: Secondary | ICD-10-CM

## 2014-11-12 DIAGNOSIS — G40909 Epilepsy, unspecified, not intractable, without status epilepticus: Secondary | ICD-10-CM | POA: Insufficient documentation

## 2014-11-12 DIAGNOSIS — M1990 Unspecified osteoarthritis, unspecified site: Secondary | ICD-10-CM

## 2014-11-12 DIAGNOSIS — R112 Nausea with vomiting, unspecified: Secondary | ICD-10-CM | POA: Insufficient documentation

## 2014-11-12 DIAGNOSIS — Z9889 Other specified postprocedural states: Secondary | ICD-10-CM | POA: Insufficient documentation

## 2014-11-12 DIAGNOSIS — R197 Diarrhea, unspecified: Secondary | ICD-10-CM

## 2014-11-12 DIAGNOSIS — K2980 Duodenitis without bleeding: Secondary | ICD-10-CM | POA: Diagnosis not present

## 2014-11-12 DIAGNOSIS — E785 Hyperlipidemia, unspecified: Secondary | ICD-10-CM | POA: Insufficient documentation

## 2014-11-12 LAB — CBC WITH DIFFERENTIAL/PLATELET
BASOS PCT: 0 % (ref 0–1)
Basophils Absolute: 0 10*3/uL (ref 0.0–0.1)
EOS ABS: 0.1 10*3/uL (ref 0.0–0.7)
EOS PCT: 1 % (ref 0–5)
HCT: 43.9 % (ref 36.0–46.0)
Hemoglobin: 14.6 g/dL (ref 12.0–15.0)
Lymphocytes Relative: 16 % (ref 12–46)
Lymphs Abs: 1.5 10*3/uL (ref 0.7–4.0)
MCH: 25 pg — ABNORMAL LOW (ref 26.0–34.0)
MCHC: 33.3 g/dL (ref 30.0–36.0)
MCV: 75.2 fL — AB (ref 78.0–100.0)
MONO ABS: 0.3 10*3/uL (ref 0.1–1.0)
Monocytes Relative: 4 % (ref 3–12)
Neutro Abs: 7.2 10*3/uL (ref 1.7–7.7)
Neutrophils Relative %: 79 % — ABNORMAL HIGH (ref 43–77)
Platelets: 330 10*3/uL (ref 150–400)
RBC: 5.84 MIL/uL — ABNORMAL HIGH (ref 3.87–5.11)
RDW: 15 % (ref 11.5–15.5)
WBC: 9.1 10*3/uL (ref 4.0–10.5)

## 2014-11-12 LAB — COMPREHENSIVE METABOLIC PANEL
ALK PHOS: 75 U/L (ref 39–117)
ALT: 31 U/L (ref 0–35)
AST: 33 U/L (ref 0–37)
Albumin: 5 g/dL (ref 3.5–5.2)
Anion gap: 10 (ref 5–15)
BUN: 13 mg/dL (ref 6–23)
CHLORIDE: 106 mmol/L (ref 96–112)
CO2: 26 mmol/L (ref 19–32)
CREATININE: 1.08 mg/dL (ref 0.50–1.10)
Calcium: 9.7 mg/dL (ref 8.4–10.5)
GFR, EST AFRICAN AMERICAN: 63 mL/min — AB (ref >90)
GFR, EST NON AFRICAN AMERICAN: 55 mL/min — AB (ref >90)
Glucose, Bld: 129 mg/dL — ABNORMAL HIGH (ref 70–99)
Potassium: 3.3 mmol/L — ABNORMAL LOW (ref 3.5–5.1)
SODIUM: 142 mmol/L (ref 135–145)
Total Bilirubin: 0.6 mg/dL (ref 0.3–1.2)
Total Protein: 9.4 g/dL — ABNORMAL HIGH (ref 6.0–8.3)

## 2014-11-12 LAB — URINALYSIS, ROUTINE W REFLEX MICROSCOPIC
GLUCOSE, UA: NEGATIVE mg/dL
Ketones, ur: NEGATIVE mg/dL
Nitrite: NEGATIVE
PH: 8 (ref 5.0–8.0)
Protein, ur: NEGATIVE mg/dL
Specific Gravity, Urine: 1.006 (ref 1.005–1.030)
Urobilinogen, UA: 0.2 mg/dL (ref 0.0–1.0)

## 2014-11-12 LAB — URINE MICROSCOPIC-ADD ON

## 2014-11-12 LAB — I-STAT CG4 LACTIC ACID, ED: Lactic Acid, Venous: 1.15 mmol/L (ref 0.5–2.0)

## 2014-11-12 LAB — LIPASE, BLOOD: LIPASE: 47 U/L (ref 11–59)

## 2014-11-12 MED ORDER — IOHEXOL 300 MG/ML  SOLN
25.0000 mL | Freq: Once | INTRAMUSCULAR | Status: AC | PRN
  Administered 2014-11-12: 25 mL via ORAL

## 2014-11-12 MED ORDER — MORPHINE SULFATE 2 MG/ML IJ SOLN
2.0000 mg | Freq: Once | INTRAMUSCULAR | Status: AC
  Administered 2014-11-12: 2 mg via INTRAVENOUS
  Filled 2014-11-12: qty 1

## 2014-11-12 MED ORDER — SODIUM CHLORIDE 0.9 % IV SOLN
1000.0000 mL | INTRAVENOUS | Status: DC
  Administered 2014-11-12: 1000 mL via INTRAVENOUS

## 2014-11-12 MED ORDER — ONDANSETRON HCL 4 MG/2ML IJ SOLN
4.0000 mg | Freq: Once | INTRAMUSCULAR | Status: AC
  Administered 2014-11-12: 4 mg via INTRAVENOUS
  Filled 2014-11-12: qty 2

## 2014-11-12 MED ORDER — SODIUM CHLORIDE 0.9 % IV SOLN
1000.0000 mL | Freq: Once | INTRAVENOUS | Status: AC
  Administered 2014-11-12: 1000 mL via INTRAVENOUS

## 2014-11-12 MED ORDER — OXYCODONE-ACETAMINOPHEN 5-325 MG PO TABS: 2.0000 | ORAL_TABLET | ORAL | Status: DC | PRN

## 2014-11-12 MED ORDER — HYDROMORPHONE HCL 1 MG/ML IJ SOLN
0.5000 mg | Freq: Once | INTRAMUSCULAR | Status: AC
  Administered 2014-11-12: 0.5 mg via INTRAVENOUS
  Filled 2014-11-12: qty 1

## 2014-11-12 MED ORDER — IOHEXOL 300 MG/ML  SOLN
100.0000 mL | Freq: Once | INTRAMUSCULAR | Status: AC | PRN
  Administered 2014-11-12: 100 mL via INTRAVENOUS

## 2014-11-12 MED ORDER — CEFTRIAXONE SODIUM 1 G IJ SOLR
INTRAMUSCULAR | Status: AC
  Filled 2014-11-12: qty 10

## 2014-11-12 MED ORDER — CEPHALEXIN 500 MG PO CAPS: 500.0000 mg | ORAL_CAPSULE | Freq: Four times a day (QID) | ORAL | Status: DC

## 2014-11-12 MED ORDER — DEXTROSE 5 % IV SOLN
1.0000 g | INTRAVENOUS | Status: DC
  Administered 2014-11-12: 1 g via INTRAVENOUS

## 2014-11-12 NOTE — Discharge Instructions (Signed)
Abdominal Pain °Many things can cause abdominal pain. Usually, abdominal pain is not caused by a disease and will improve without treatment. It can often be observed and treated at home. Your health care provider will do a physical exam and possibly order blood tests and X-rays to help determine the seriousness of your pain. However, in many cases, more time must pass before a clear cause of the pain can be found. Before that point, your health care provider may not know if you need more testing or further treatment. °HOME CARE INSTRUCTIONS  °Monitor your abdominal pain for any changes. The following actions may help to alleviate any discomfort you are experiencing: °· Only take over-the-counter or prescription medicines as directed by your health care provider. °· Do not take laxatives unless directed to do so by your health care provider. °· Try a clear liquid diet (broth, tea, or water) as directed by your health care provider. Slowly move to a bland diet as tolerated. °SEEK MEDICAL CARE IF: °· You have unexplained abdominal pain. °· You have abdominal pain associated with nausea or diarrhea. °· You have pain when you urinate or have a bowel movement. °· You experience abdominal pain that wakes you in the night. °· You have abdominal pain that is worsened or improved by eating food. °· You have abdominal pain that is worsened with eating fatty foods. °· You have a fever. °SEEK IMMEDIATE MEDICAL CARE IF:  °· Your pain does not go away within 2 hours. °· You keep throwing up (vomiting). °· Your pain is felt only in portions of the abdomen, such as the right side or the left lower portion of the abdomen. °· You pass bloody or black tarry stools. °MAKE SURE YOU: °· Understand these instructions.   °· Will watch your condition.   °· Will get help right away if you are not doing well or get worse.   °Document Released: 06/30/2005 Document Revised: 09/25/2013 Document Reviewed: 05/30/2013 °[employer]® Patient Information  ©2015 [employer]. This information is not intended to replace advice given to you by your health care provider. Make sure you discuss any questions you have with your health care provider. °Urinary Tract Infection °Urinary tract infections (UTIs) can develop anywhere along your urinary tract. Your urinary tract is your body's drainage system for removing wastes and extra water. Your urinary tract includes two kidneys, two ureters, a bladder, and a urethra. Your kidneys are a pair of bean-shaped organs. Each kidney is about the size of your fist. They are located below your ribs, one on each side of your spine. °CAUSES °Infections are caused by microbes, which are microscopic organisms, including fungi, viruses, and bacteria. These organisms are so small that they can only be seen through a microscope. Bacteria are the microbes that most commonly cause UTIs. °SYMPTOMS  °Symptoms of UTIs may vary by age and gender of the patient and by the location of the infection. Symptoms in young women typically include a frequent and intense urge to urinate and a painful, burning feeling in the bladder or urethra during urination. Older women and men are more likely to be tired, shaky, and weak and have muscle aches and abdominal pain. A fever may mean the infection is in your kidneys. Other symptoms of a kidney infection include pain in your back or sides below the ribs, nausea, and vomiting. °DIAGNOSIS °To diagnose a UTI, your caregiver will ask you about your symptoms. Your caregiver also will ask to provide a urine sample. The urine sample   will be tested for bacteria and white blood cells. White blood cells are made by your body to help fight infection. °TREATMENT  °Typically, UTIs can be treated with medication. Because most UTIs are caused by a bacterial infection, they usually can be treated with the use of antibiotics. The choice of antibiotic and length of treatment depend on your symptoms and the type of bacteria  causing your infection. °HOME CARE INSTRUCTIONS °· If you were prescribed antibiotics, take them exactly as your caregiver instructs you. Finish the medication even if you feel better after you have only taken some of the medication. °· Drink enough water and fluids to keep your urine clear or pale yellow. °· Avoid caffeine, tea, and carbonated beverages. They tend to irritate your bladder. °· Empty your bladder often. Avoid holding urine for long periods of time. °· Empty your bladder before and after sexual intercourse. °· After a bowel movement, women should cleanse from front to back. Use each tissue only once. °SEEK MEDICAL CARE IF:  °· You have back pain. °· You develop a fever. °· Your symptoms do not begin to resolve within 3 days. °SEEK IMMEDIATE MEDICAL CARE IF:  °· You have severe back pain or lower abdominal pain. °· You develop chills. °· You have nausea or vomiting. °· You have continued burning or discomfort with urination. °MAKE SURE YOU:  °· Understand these instructions. °· Will watch your condition. °· Will get help right away if you are not doing well or get worse. °Document Released: 06/30/2005 Document Revised: 03/21/2012 Document Reviewed: 10/29/2011 °ExitCare® Patient Information ©2015 [employer]. This information is not intended to replace advice given to you by your health care provider. Make sure you discuss any questions you have with your health care provider. ° °
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
## 2014-11-12 NOTE — ED Notes (Signed)
Pt c/o abd pain with n/v/d x 12 hrs

## 2014-11-13 ENCOUNTER — Inpatient Hospital Stay (HOSPITAL_BASED_OUTPATIENT_CLINIC_OR_DEPARTMENT_OTHER)
Admission: EM | Admit: 2014-11-13 | Discharge: 2014-11-18 | DRG: 392 | Disposition: A | Discharge status: Home / Self Care | Payer: Medicare Other | Attending: Internal Medicine | Admitting: Internal Medicine

## 2014-11-13 ENCOUNTER — Encounter (HOSPITAL_BASED_OUTPATIENT_CLINIC_OR_DEPARTMENT_OTHER): Payer: Self-pay | Admitting: *Deleted

## 2014-11-13 DIAGNOSIS — I10 Essential (primary) hypertension: Secondary | ICD-10-CM | POA: Diagnosis present

## 2014-11-13 DIAGNOSIS — R519 Headache, unspecified: Secondary | ICD-10-CM

## 2014-11-13 DIAGNOSIS — N179 Acute kidney failure, unspecified: Secondary | ICD-10-CM | POA: Diagnosis present

## 2014-11-13 DIAGNOSIS — I482 Chronic atrial fibrillation: Secondary | ICD-10-CM | POA: Diagnosis not present

## 2014-11-13 DIAGNOSIS — Z7902 Long term (current) use of antithrombotics/antiplatelets: Secondary | ICD-10-CM

## 2014-11-13 DIAGNOSIS — Z888 Allergy status to other drugs, medicaments and biological substances status: Secondary | ICD-10-CM

## 2014-11-13 DIAGNOSIS — Z90710 Acquired absence of both cervix and uterus: Secondary | ICD-10-CM

## 2014-11-13 DIAGNOSIS — F419 Anxiety disorder, unspecified: Secondary | ICD-10-CM | POA: Diagnosis present

## 2014-11-13 DIAGNOSIS — Z803 Family history of malignant neoplasm of breast: Secondary | ICD-10-CM | POA: Diagnosis not present

## 2014-11-13 DIAGNOSIS — A084 Viral intestinal infection, unspecified: Secondary | ICD-10-CM | POA: Diagnosis present

## 2014-11-13 DIAGNOSIS — Z885 Allergy status to narcotic agent status: Secondary | ICD-10-CM | POA: Diagnosis not present

## 2014-11-13 DIAGNOSIS — R609 Edema, unspecified: Secondary | ICD-10-CM | POA: Diagnosis present

## 2014-11-13 DIAGNOSIS — K920 Hematemesis: Secondary | ICD-10-CM | POA: Diagnosis present

## 2014-11-13 DIAGNOSIS — Z792 Long term (current) use of antibiotics: Secondary | ICD-10-CM

## 2014-11-13 DIAGNOSIS — Z886 Allergy status to analgesic agent status: Secondary | ICD-10-CM

## 2014-11-13 DIAGNOSIS — D649 Anemia, unspecified: Secondary | ICD-10-CM | POA: Diagnosis present

## 2014-11-13 DIAGNOSIS — M549 Dorsalgia, unspecified: Secondary | ICD-10-CM | POA: Diagnosis present

## 2014-11-13 DIAGNOSIS — N390 Urinary tract infection, site not specified: Secondary | ICD-10-CM | POA: Diagnosis present

## 2014-11-13 DIAGNOSIS — J45909 Unspecified asthma, uncomplicated: Secondary | ICD-10-CM | POA: Diagnosis present

## 2014-11-13 DIAGNOSIS — Z87891 Personal history of nicotine dependence: Secondary | ICD-10-CM | POA: Diagnosis not present

## 2014-11-13 DIAGNOSIS — E785 Hyperlipidemia, unspecified: Secondary | ICD-10-CM | POA: Diagnosis present

## 2014-11-13 DIAGNOSIS — Z96652 Presence of left artificial knee joint: Secondary | ICD-10-CM | POA: Diagnosis present

## 2014-11-13 DIAGNOSIS — R110 Nausea: Secondary | ICD-10-CM

## 2014-11-13 DIAGNOSIS — J4520 Mild intermittent asthma, uncomplicated: Secondary | ICD-10-CM | POA: Diagnosis not present

## 2014-11-13 DIAGNOSIS — Z833 Family history of diabetes mellitus: Secondary | ICD-10-CM | POA: Diagnosis not present

## 2014-11-13 DIAGNOSIS — I4891 Unspecified atrial fibrillation: Secondary | ICD-10-CM | POA: Diagnosis present

## 2014-11-13 DIAGNOSIS — R112 Nausea with vomiting, unspecified: Secondary | ICD-10-CM | POA: Diagnosis present

## 2014-11-13 DIAGNOSIS — Z79891 Long term (current) use of opiate analgesic: Secondary | ICD-10-CM | POA: Diagnosis not present

## 2014-11-13 DIAGNOSIS — R0789 Other chest pain: Secondary | ICD-10-CM | POA: Diagnosis present

## 2014-11-13 DIAGNOSIS — Z79899 Other long term (current) drug therapy: Secondary | ICD-10-CM | POA: Diagnosis not present

## 2014-11-13 DIAGNOSIS — K2980 Duodenitis without bleeding: Secondary | ICD-10-CM | POA: Diagnosis present

## 2014-11-13 DIAGNOSIS — T45515A Adverse effect of anticoagulants, initial encounter: Secondary | ICD-10-CM | POA: Diagnosis present

## 2014-11-13 DIAGNOSIS — E860 Dehydration: Secondary | ICD-10-CM | POA: Diagnosis present

## 2014-11-13 DIAGNOSIS — I480 Paroxysmal atrial fibrillation: Secondary | ICD-10-CM | POA: Diagnosis not present

## 2014-11-13 DIAGNOSIS — E669 Obesity, unspecified: Secondary | ICD-10-CM | POA: Diagnosis present

## 2014-11-13 DIAGNOSIS — R1013 Epigastric pain: Secondary | ICD-10-CM | POA: Diagnosis not present

## 2014-11-13 DIAGNOSIS — K219 Gastro-esophageal reflux disease without esophagitis: Secondary | ICD-10-CM | POA: Diagnosis present

## 2014-11-13 DIAGNOSIS — R51 Headache: Secondary | ICD-10-CM

## 2014-11-13 DIAGNOSIS — G8929 Other chronic pain: Secondary | ICD-10-CM | POA: Diagnosis present

## 2014-11-13 DIAGNOSIS — Z8249 Family history of ischemic heart disease and other diseases of the circulatory system: Secondary | ICD-10-CM

## 2014-11-13 DIAGNOSIS — Z8673 Personal history of transient ischemic attack (TIA), and cerebral infarction without residual deficits: Secondary | ICD-10-CM | POA: Diagnosis not present

## 2014-11-13 DIAGNOSIS — J329 Chronic sinusitis, unspecified: Secondary | ICD-10-CM | POA: Diagnosis present

## 2014-11-13 DIAGNOSIS — M1990 Unspecified osteoarthritis, unspecified site: Secondary | ICD-10-CM | POA: Diagnosis present

## 2014-11-13 DIAGNOSIS — Z7901 Long term (current) use of anticoagulants: Secondary | ICD-10-CM | POA: Diagnosis not present

## 2014-11-13 DIAGNOSIS — G2581 Restless legs syndrome: Secondary | ICD-10-CM | POA: Diagnosis present

## 2014-11-13 DIAGNOSIS — I481 Persistent atrial fibrillation: Secondary | ICD-10-CM | POA: Diagnosis not present

## 2014-11-13 DIAGNOSIS — R748 Abnormal levels of other serum enzymes: Secondary | ICD-10-CM | POA: Diagnosis present

## 2014-11-13 DIAGNOSIS — R197 Diarrhea, unspecified: Secondary | ICD-10-CM

## 2014-11-13 DIAGNOSIS — R1011 Right upper quadrant pain: Secondary | ICD-10-CM

## 2014-11-13 DIAGNOSIS — K210 Gastro-esophageal reflux disease with esophagitis: Secondary | ICD-10-CM

## 2014-11-13 LAB — COMPREHENSIVE METABOLIC PANEL
ALBUMIN: 4.5 g/dL (ref 3.5–5.2)
ALT: 25 U/L (ref 0–35)
ANION GAP: 4 — AB (ref 5–15)
AST: 22 U/L (ref 0–37)
Alkaline Phosphatase: 71 U/L (ref 39–117)
BUN: 27 mg/dL — AB (ref 6–23)
CALCIUM: 8.7 mg/dL (ref 8.4–10.5)
CO2: 25 mmol/L (ref 19–32)
CREATININE: 2 mg/dL — AB (ref 0.50–1.10)
Chloride: 110 mmol/L (ref 96–112)
GFR calc Af Amer: 30 mL/min — ABNORMAL LOW (ref >90)
GFR calc non Af Amer: 26 mL/min — ABNORMAL LOW (ref >90)
Glucose, Bld: 88 mg/dL (ref 70–99)
Potassium: 3.5 mmol/L (ref 3.5–5.1)
Sodium: 139 mmol/L (ref 135–145)
Total Bilirubin: 0.2 mg/dL — ABNORMAL LOW (ref 0.3–1.2)
Total Protein: 8.4 g/dL — ABNORMAL HIGH (ref 6.0–8.3)

## 2014-11-13 LAB — CBC WITH DIFFERENTIAL/PLATELET
Basophils Absolute: 0 10*3/uL (ref 0.0–0.1)
Basophils Relative: 0 % (ref 0–1)
EOS PCT: 2 % (ref 0–5)
Eosinophils Absolute: 0.1 10*3/uL (ref 0.0–0.7)
HEMATOCRIT: 39.1 % (ref 36.0–46.0)
Hemoglobin: 12.7 g/dL (ref 12.0–15.0)
Lymphocytes Relative: 36 % (ref 12–46)
Lymphs Abs: 2.3 10*3/uL (ref 0.7–4.0)
MCH: 25.1 pg — ABNORMAL LOW (ref 26.0–34.0)
MCHC: 32.5 g/dL (ref 30.0–36.0)
MCV: 77.4 fL — ABNORMAL LOW (ref 78.0–100.0)
MONO ABS: 0.4 10*3/uL (ref 0.1–1.0)
Monocytes Relative: 7 % (ref 3–12)
Neutro Abs: 3.5 10*3/uL (ref 1.7–7.7)
Neutrophils Relative %: 55 % (ref 43–77)
Platelets: 269 10*3/uL (ref 150–400)
RBC: 5.05 MIL/uL (ref 3.87–5.11)
RDW: 15.1 % (ref 11.5–15.5)
WBC: 6.4 10*3/uL (ref 4.0–10.5)

## 2014-11-13 LAB — LIPASE, BLOOD: Lipase: 62 U/L — ABNORMAL HIGH (ref 11–59)

## 2014-11-13 LAB — TROPONIN I: Troponin I: <0.03 ng/mL (ref <0.031)

## 2014-11-13 LAB — PROTIME-INR
INR: 1.9 — ABNORMAL HIGH (ref 0.00–1.49)
Prothrombin Time: 21.8 seconds — ABNORMAL HIGH (ref 11.6–15.2)

## 2014-11-13 LAB — OCCULT BLOOD X 1 CARD TO LAB, STOOL: FECAL OCCULT BLD: NEGATIVE

## 2014-11-13 MED ORDER — SUCRALFATE 1 GM/10ML PO SUSP
1.0000 g | Freq: Three times a day (TID) | ORAL | Status: DC
  Administered 2014-11-13 – 2014-11-17 (×14): 1 g via ORAL
  Filled 2014-11-13 (×19): qty 10

## 2014-11-13 MED ORDER — HYDRALAZINE HCL 25 MG PO TABS
25.0000 mg | ORAL_TABLET | Freq: Three times a day (TID) | ORAL | Status: DC
Start: 2014-11-13 — End: 2014-11-18
  Administered 2014-11-13 – 2014-11-18 (×13): 25 mg via ORAL
  Filled 2014-11-13 (×16): qty 1

## 2014-11-13 MED ORDER — HYDROMORPHONE HCL 1 MG/ML IJ SOLN
1.0000 mg | INTRAMUSCULAR | Status: DC | PRN
  Administered 2014-11-13 – 2014-11-15 (×9): 1 mg via INTRAVENOUS
  Filled 2014-11-13 (×9): qty 1

## 2014-11-13 MED ORDER — ONDANSETRON HCL 4 MG/2ML IJ SOLN
4.0000 mg | Freq: Once | INTRAMUSCULAR | Status: AC
  Administered 2014-11-13: 4 mg via INTRAVENOUS
  Filled 2014-11-13: qty 2

## 2014-11-13 MED ORDER — SODIUM CHLORIDE 0.9 % IV SOLN
INTRAVENOUS | Status: DC
  Administered 2014-11-13 – 2014-11-17 (×6): via INTRAVENOUS

## 2014-11-13 MED ORDER — GABAPENTIN 300 MG PO CAPS
300.0000 mg | ORAL_CAPSULE | Freq: Three times a day (TID) | ORAL | Status: DC
  Administered 2014-11-13 – 2014-11-18 (×13): 300 mg via ORAL
  Filled 2014-11-13 (×17): qty 1

## 2014-11-13 MED ORDER — FENTANYL CITRATE 0.05 MG/ML IJ SOLN
50.0000 ug | Freq: Once | INTRAMUSCULAR | Status: AC
  Administered 2014-11-13: 50 ug via INTRAVENOUS
  Filled 2014-11-13: qty 2

## 2014-11-13 MED ORDER — SODIUM CHLORIDE 0.9 % IV BOLUS (SEPSIS)
1000.0000 mL | Freq: Once | INTRAVENOUS | Status: AC
  Administered 2014-11-13: 1000 mL via INTRAVENOUS

## 2014-11-13 MED ORDER — ONDANSETRON HCL 4 MG/2ML IJ SOLN
4.0000 mg | Freq: Three times a day (TID) | INTRAMUSCULAR | Status: DC | PRN
  Administered 2014-11-14 (×2): 4 mg via INTRAVENOUS
  Filled 2014-11-13 (×3): qty 2

## 2014-11-13 MED ORDER — PANTOPRAZOLE SODIUM 40 MG IV SOLR
40.0000 mg | Freq: Two times a day (BID) | INTRAVENOUS | Status: DC
Start: 2014-11-13 — End: 2014-11-17
  Administered 2014-11-13 – 2014-11-16 (×7): 40 mg via INTRAVENOUS
  Filled 2014-11-13 (×9): qty 40

## 2014-11-13 MED ORDER — OXYCODONE-ACETAMINOPHEN 5-325 MG PO TABS
2.0000 | ORAL_TABLET | ORAL | Status: DC | PRN
  Administered 2014-11-14 – 2014-11-15 (×4): 2 via ORAL
  Filled 2014-11-13 (×4): qty 2

## 2014-11-13 MED ORDER — PANTOPRAZOLE SODIUM 40 MG IV SOLR
40.0000 mg | Freq: Once | INTRAVENOUS | Status: AC
  Administered 2014-11-13: 40 mg via INTRAVENOUS
  Filled 2014-11-13: qty 40

## 2014-11-13 MED ORDER — SODIUM CHLORIDE 0.9 % IJ SOLN
3.0000 mL | Freq: Two times a day (BID) | INTRAMUSCULAR | Status: DC
  Administered 2014-11-13 – 2014-11-16 (×3): 3 mL via INTRAVENOUS

## 2014-11-13 MED ORDER — PRAVASTATIN SODIUM 20 MG PO TABS
20.0000 mg | ORAL_TABLET | Freq: Every day | ORAL | Status: DC
  Administered 2014-11-13 – 2014-11-18 (×6): 20 mg via ORAL
  Filled 2014-11-13 (×7): qty 1

## 2014-11-13 MED ORDER — DILTIAZEM HCL ER COATED BEADS 240 MG PO CP24
240.0000 mg | ORAL_CAPSULE | Freq: Every day | ORAL | Status: DC
  Administered 2014-11-13 – 2014-11-18 (×6): 240 mg via ORAL
  Filled 2014-11-13 (×6): qty 1

## 2014-11-13 MED ORDER — CEPHALEXIN 500 MG PO CAPS
500.0000 mg | ORAL_CAPSULE | Freq: Four times a day (QID) | ORAL | Status: DC
  Administered 2014-11-13 – 2014-11-14 (×5): 500 mg via ORAL
  Filled 2014-11-13 (×8): qty 1

## 2014-11-13 MED ORDER — TEMAZEPAM 7.5 MG PO CAPS
7.5000 mg | ORAL_CAPSULE | Freq: Every evening | ORAL | Status: DC | PRN
  Administered 2014-11-13 – 2014-11-17 (×4): 7.5 mg via ORAL
  Filled 2014-11-13 (×4): qty 1

## 2014-11-13 NOTE — ED Notes (Signed)
Pt placed on cardiac monitor 

## 2014-11-13 NOTE — ED Notes (Signed)
Dr. Regenia Skeeter at bedside to collect occult blood sample.
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
## 2014-11-13 NOTE — ED Notes (Signed)
Pt was seen here yesterday and dx with UTI. She was placed on antibiotics. She sts she is no better today and is now vomiting and diarrhea. She sts the pain is radiating from her abdomen up into her chest. She reported no chest discomfort to EMS but is doing so now in triage.

## 2014-11-14 ENCOUNTER — Encounter (HOSPITAL_COMMUNITY): Payer: Self-pay | Admitting: *Deleted

## 2014-11-14 ENCOUNTER — Inpatient Hospital Stay (HOSPITAL_COMMUNITY): Payer: Medicare Other

## 2014-11-14 ENCOUNTER — Encounter (HOSPITAL_COMMUNITY)
Admission: EM | Disposition: A | Discharge status: Home / Self Care | Payer: Self-pay | Source: Home / Self Care | Attending: Internal Medicine

## 2014-11-14 DIAGNOSIS — R748 Abnormal levels of other serum enzymes: Secondary | ICD-10-CM

## 2014-11-14 DIAGNOSIS — K2980 Duodenitis without bleeding: Secondary | ICD-10-CM

## 2014-11-14 DIAGNOSIS — I4891 Unspecified atrial fibrillation: Secondary | ICD-10-CM

## 2014-11-14 HISTORY — DX: Duodenitis without bleeding: K29.80

## 2014-11-14 HISTORY — PX: ESOPHAGOGASTRODUODENOSCOPY: SHX5428

## 2014-11-14 LAB — CBC
HCT: 37.1 % (ref 36.0–46.0)
HCT: 34.2 % — ABNORMAL LOW (ref 36.0–46.0)
HEMATOCRIT: 34.7 % — AB (ref 36.0–46.0)
Hemoglobin: 12.1 g/dL (ref 12.0–15.0)
Hemoglobin: 10.9 g/dL — ABNORMAL LOW (ref 12.0–15.0)
Hemoglobin: 11 g/dL — ABNORMAL LOW (ref 12.0–15.0)
MCH: 25.9 pg — AB (ref 26.0–34.0)
MCH: 24.9 pg — ABNORMAL LOW (ref 26.0–34.0)
MCH: 25.6 pg — ABNORMAL LOW (ref 26.0–34.0)
MCHC: 32.6 g/dL (ref 30.0–36.0)
MCHC: 31.4 g/dL (ref 30.0–36.0)
MCHC: 32.2 g/dL (ref 30.0–36.0)
MCV: 79.3 fL (ref 78.0–100.0)
MCV: 79.4 fL (ref 78.0–100.0)
MCV: 79.5 fL (ref 78.0–100.0)
PLATELETS: 251 10*3/uL (ref 150–400)
PLATELETS: 228 10*3/uL (ref 150–400)
Platelets: 219 10*3/uL (ref 150–400)
RBC: 4.68 MIL/uL (ref 3.87–5.11)
RBC: 4.37 MIL/uL (ref 3.87–5.11)
RBC: 4.3 MIL/uL (ref 3.87–5.11)
RDW: 15 % (ref 11.5–15.5)
RDW: 14.9 % (ref 11.5–15.5)
RDW: 15 % (ref 11.5–15.5)
WBC: 6.2 10*3/uL (ref 4.0–10.5)
WBC: 5.1 10*3/uL (ref 4.0–10.5)
WBC: 4.9 10*3/uL (ref 4.0–10.5)

## 2014-11-14 LAB — CREATININE, URINE, RANDOM: CREATININE, URINE: 148.5 mg/dL

## 2014-11-14 LAB — TYPE AND SCREEN
ABO/RH(D): O POS
Antibody Screen: NEGATIVE

## 2014-11-14 LAB — COMPREHENSIVE METABOLIC PANEL
ALBUMIN: 3.5 g/dL (ref 3.5–5.2)
ALK PHOS: 55 U/L (ref 39–117)
ALT: 19 U/L (ref 0–35)
ANION GAP: 4 — AB (ref 5–15)
AST: 20 U/L (ref 0–37)
BILIRUBIN TOTAL: 0.6 mg/dL (ref 0.3–1.2)
BUN: 21 mg/dL (ref 6–23)
CO2: 23 mmol/L (ref 19–32)
CREATININE: 1.5 mg/dL — AB (ref 0.50–1.10)
Calcium: 8.3 mg/dL — ABNORMAL LOW (ref 8.4–10.5)
Chloride: 115 mmol/L — ABNORMAL HIGH (ref 96–112)
GFR, EST AFRICAN AMERICAN: 43 mL/min — AB (ref >90)
GFR, EST NON AFRICAN AMERICAN: 37 mL/min — AB (ref >90)
GLUCOSE: 92 mg/dL (ref 70–99)
Potassium: 3.4 mmol/L — ABNORMAL LOW (ref 3.5–5.1)
Sodium: 142 mmol/L (ref 135–145)
Total Protein: 6.4 g/dL (ref 6.0–8.3)

## 2014-11-14 LAB — ABO/RH: ABO/RH(D): O POS

## 2014-11-14 LAB — GLUCOSE, CAPILLARY: GLUCOSE-CAPILLARY: 84 mg/dL (ref 70–99)

## 2014-11-14 LAB — TROPONIN I
Troponin I: <0.03 ng/mL (ref <0.031)
Troponin I: <0.03 ng/mL (ref <0.031)

## 2014-11-14 LAB — SODIUM, URINE, RANDOM: Sodium, Ur: 38 mEq/L

## 2014-11-14 LAB — PROTIME-INR
INR: 2.12 — AB (ref 0.00–1.49)
Prothrombin Time: 24 seconds — ABNORMAL HIGH (ref 11.6–15.2)

## 2014-11-14 LAB — APTT: aPTT: 37 seconds (ref 24–37)

## 2014-11-14 SURGERY — EGD (ESOPHAGOGASTRODUODENOSCOPY)
Anesthesia: Moderate Sedation

## 2014-11-14 MED ORDER — MIDAZOLAM HCL 10 MG/2ML IJ SOLN
INTRAMUSCULAR | Status: AC
  Filled 2014-11-14: qty 2

## 2014-11-14 MED ORDER — SODIUM CHLORIDE 0.9 % IV SOLN
INTRAVENOUS | Status: DC
  Administered 2014-11-14: 500 mL via INTRAVENOUS

## 2014-11-14 MED ORDER — MIDAZOLAM HCL 10 MG/2ML IJ SOLN
INTRAMUSCULAR | Status: DC | PRN
  Administered 2014-11-14: 2 mg via INTRAVENOUS
  Administered 2014-11-14: 1 mg via INTRAVENOUS

## 2014-11-14 MED ORDER — DIPHENHYDRAMINE HCL 50 MG/ML IJ SOLN
INTRAMUSCULAR | Status: AC
  Filled 2014-11-14: qty 1

## 2014-11-14 MED ORDER — FENTANYL CITRATE 0.05 MG/ML IJ SOLN
INTRAMUSCULAR | Status: AC
  Filled 2014-11-14: qty 2

## 2014-11-14 MED ORDER — CARVEDILOL 12.5 MG PO TABS
12.5000 mg | ORAL_TABLET | Freq: Two times a day (BID) | ORAL | Status: DC
  Administered 2014-11-14 – 2014-11-18 (×7): 12.5 mg via ORAL
  Filled 2014-11-14 (×8): qty 1

## 2014-11-14 MED ORDER — DIPHENHYDRAMINE HCL 50 MG/ML IJ SOLN
INTRAMUSCULAR | Status: DC | PRN
  Administered 2014-11-14: 25 mg via INTRAVENOUS

## 2014-11-14 MED ORDER — FENTANYL CITRATE 0.05 MG/ML IJ SOLN
INTRAMUSCULAR | Status: DC | PRN
  Administered 2014-11-14: 25 ug via INTRAVENOUS

## 2014-11-14 MED ORDER — BUTAMBEN-TETRACAINE-BENZOCAINE 2-2-14 % EX AERO
INHALATION_SPRAY | CUTANEOUS | Status: DC | PRN
  Administered 2014-11-14: 2 via TOPICAL

## 2014-11-14 NOTE — Evaluation (Signed)
Physical Therapy Evaluation Patient Details Name: Audrey Hill MRN: [id number] DOB: 1956-07-21 Today's Date: 11/14/2014   History of Present Illness  Pt admitted with Nausea, vomiting, diarrhea, abdominal pain, hematemesis, increased urinary frequency  Clinical Impression  On eval, pt was Min guard for mobility-able to ambulate ~50 feet with straight cane. Pt reports not feeling well. Recommend daily mobility/ambulation with nursing supervision    Follow Up Recommendations No PT follow up    Equipment Recommendations  None recommended by PT    Recommendations for Other Services OT consult     Precautions / Restrictions Precautions Precautions: None Restrictions Weight Bearing Restrictions: No      Mobility  Bed Mobility Overal bed mobility: Needs Assistance Bed Mobility: Supine to Sit     Supine to sit: Modified independent (Device/Increase time)     General bed mobility comments: back to bed  Transfers Overall transfer level: Needs assistance Equipment used: Rolling walker (2 wheeled) Transfers: Sit to/from Stand Sit to Stand: Min guard         General transfer comment: clsoe guard for  safety  Ambulation/Gait Ambulation/Gait assistance: Min guard Ambulation Distance (Feet): 50 Feet Assistive device: Straight cane       General Gait Details: close guard for safety. No LOB  Stairs            Wheelchair Mobility    Modified Rankin (Stroke Patients Only)       Balance                                             Pertinent Vitals/Pain Pain Assessment: Faces Pain Score: 4  Faces Pain Scale: Hurts even more Pain Location: abdomen Pain Descriptors / Indicators: Sore Pain Intervention(s): Monitored during session    Home Living Family/patient expects to be discharged to:: Private residence Living Arrangements: Children Available Help at Discharge: Family Type of Home: Apartment Home Access: Stairs to enter    Technical brewer of Steps: 2 Home Layout: One level        Prior Function Level of Independence: Independent               Hand Dominance        Extremity/Trunk Assessment   Upper Extremity Assessment: Defer to OT evaluation           Lower Extremity Assessment: Generalized weakness      Cervical / Trunk Assessment: Normal  Communication   Communication: No difficulties  Cognition Arousal/Alertness: Awake/alert Behavior During Therapy: WFL for tasks assessed/performed Overall Cognitive Status: Within Functional Limits for tasks assessed                      General Comments      Exercises        Assessment/Plan    PT Assessment Patient needs continued PT services  PT Diagnosis Generalized weakness;Acute pain;Difficulty walking   PT Problem List Decreased activity tolerance;Decreased balance;Decreased mobility;Obesity;Pain;Decreased strength  PT Treatment Interventions DME instruction;Gait training;Functional mobility training;Therapeutic activities;Therapeutic exercise;Patient/family education;Balance training   PT Goals (Current goals can be found in the Care Plan section) Acute Rehab PT Goals Patient Stated Goal: back home to apartment PT Goal Formulation: With patient Time For Goal Achievement: 11/28/14 Potential to Achieve Goals: Good    Frequency Min 3X/week   Barriers to discharge        Co-evaluation  End of Session   Activity Tolerance: Patient tolerated treatment well Patient left: in bed;with call bell/phone within reach           Time: 1117-1131 PT Time Calculation (min) (ACUTE ONLY): 14 min   Charges:   PT Evaluation $Initial PT Evaluation Tier I: 1 Procedure     PT G Codes:        Audrey Hill, MPT Pager: [phone]

## 2014-11-14 NOTE — Op Note (Signed)
[facility] [place], [address]   ENDOSCOPY PROCEDURE REPORT  PATIENT: Audrey Hill, Audrey Hill  MR#: [id number] BIRTHDATE: 1956/07/22 , 58  yrs. old GENDER: female ENDOSCOPIST: Lafayette Dragon, MD REFERRED BY:  Dr Wynelle Cleveland PROCEDURE DATE:  11/14/2014 PROCEDURE:  EGD w/ biopsy ASA CLASS:     Class II INDICATIONS:  hematemesis, vomiting, epigastric abdominal pain, and acute nausea and vomiting with the low volume hematemesis. Hemoglobin 12.7.  Patient has been on Coumadin INR 2.1.  History of alcohol use.  Patient has been recently in the emergency room for urinary tract infection and has been on K flax.  She is Hemoccult-negative. MEDICATIONS: Fentanyl 25 mcg IV, Benadryl 25 mg IV, and Versed 4 mg IV TOPICAL ANESTHETIC: Cetacaine Spray  DESCRIPTION OF PROCEDURE: After the risks benefits and alternatives of the procedure were thoroughly explained, informed consent was obtained.  The Pentax Gastroscope [id number] endoscope was introduced through the mouth and advanced to the second portion of the duodenum , Without limitations.  The instrument was slowly withdrawn as the mucosa was fully examined.    Esophagus: proximal and mid esophageal mucosa was normal. There was no evidence of esophagitis. In distal esophagus squamocolumnar junction was unremarkable. There were no esophageal varices,no evidence of Mallory-Weiss tear  Stomach: there was small amount of bile along the greater curvature of the stomach. Gastric folds were normal. Gastric outlet was unremarkable. Reflexion of endoscope revealed normal fundus and cardia  Duodenum: there were multiple erosions in the duodenal bulb and descending duodenum consistent with moderately severe duodenitis. There was no active bleeding and no evidence of duodenal outlet obstruction. Biopsies were obtained from duodenal bulb to rule out H. pylori[          The scope was then withdrawn from the patient and the  procedure completed.  COMPLICATIONS: There were no immediate complications.  ENDOSCOPIC IMPRESSION: 1. nothing to account for hematemesis. No varices esophagitis or Mallory-Weiss tear 2. Moderately severe duodenitis. Rule out H. pylori rule out peptic rule out alcohol induced that is post biopsies.  RECOMMENDATIONS: 1. resume Coumadin 2. Use anti-emetics to keep the patient from vomiting 3. Advance diet 4. Continue PPI 5. Await results of the H. pylori and duodenal biopsies  REPEAT EXAM: for EGD pending biopsy results.  eSigned:  Lafayette Dragon, MD 11/14/2014 3:53 PM    CC:  PATIENT NAME:  Audrey Hill, Audrey Hill MR#: [id number]
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
## 2014-11-14 NOTE — Progress Notes (Addendum)
[facility] Progress Note   Audrey Hill [id number] DOB: Oct 30, 1955 DOA: 11/13/2014 PCP: Elizabeth Palau, MD  Brief narrative: Audrey Hill is a 59 y.o. female with past medical history of atrial fibrillation on Coumadin, hypertension, hyperlipidemia, GERD, asthma, hepatitis, hx of alcohol induced seizure, history of stroke without deficit, who presents with nausea, vomiting, diarrhea, abdominal pain, hematemesis and chest discomfort.   Subjective: No further GI symptoms. Will undergo an EGD today.   Assessment/Plan: Principal Problem:   Hematemesis- anemia (likely chronic)  - possible mallory weiss tear in setting of persistant vomiting with an elevated INR due to coumadin use - she does have a h/o GERD listed in the chartg but is not on a PPI at home - NPO for now until EGD later today - note stool guaiac was negative - has had a minor drop in Hb which could be attributed to dilution  Active Problems:    Nausea vomiting and diarrhea - suspecting gastroenteritis- symptoms appear to be resolved - diet per GI -will be decided after EGD today  Chest discomfort - likely secondary to vomiting and has resolved - troponin normal - EKG unchanged from prior with inverted T waves in 2,3, aVf, V5 and V6    A-fib CHA2DS2-VASc Score is 4 - in sinus rhythm- cont Cardizem and Coreg - coumadin on hold due to hematemesis   UTI - on Keflex as outpt - started on 2/9-     AKI (acute kidney injury) - due to dehydration- improving with IVF    Elevated lipase - likely a result of above mentioned GI issues and not due to acute pancreatitis    Hypertension - cont CArdizem, Coreg and Hydalazine   Code Status: full code Family Communication:  Disposition Plan: home in 1-2 days DVT prophylaxis: on Coumadin with elevated INR  Consultants: GI  Procedures: ECG today  Antibiotics: Anti-infectives    Start     Dose/Rate Route Frequency Ordered Stop    11/13/14 2200  cephALEXin (KEFLEX) capsule 500 mg     500 mg Oral 4 times daily 11/13/14 2023           Objective: Filed Weights   11/13/14 1446  Weight: 113.399 kg (250 lb)    Intake/Output Summary (Last 24 hours) at 11/14/14 1142 Last data filed at 11/14/14 0809  Gross per 24 hour  Intake 208.33 ml  Output    950 ml  Net -741.67 ml     Vitals Filed Vitals:   11/13/14 1858 11/13/14 2215 11/14/14 0542 11/14/14 1000  BP: 134/68 108/38 118/53 127/56  Pulse: 52 61 55   Temp: 98.1 F (36.7 C) 97.7 F (36.5 C) 98.4 F (36.9 C)   TempSrc: Oral Oral Oral   Resp: 18 18 20    Height:      Weight:      SpO2: 97% 94% 96%     Exam: General: AAO x3, No acute respiratory distress Lungs: Clear to auscultation bilaterally without wheezes or crackles Cardiovascular: Regular rate and rhythm without murmur gallop or rub normal S1 and S2 Abdomen: mild tenderness in upper abdomen, nondistended, soft, bowel sounds positive, no rebound, no ascites, no appreciable mass Extremities: No significant cyanosis, clubbing, or edema bilateral lower extremities  Data Reviewed: Basic Metabolic Panel:  Recent Labs Lab 11/12/14 1810 11/13/14 1606 11/14/14 0254  NA 142 139 142  K 3.3* 3.5 3.4*  CL 106 110 115*  CO2 26 25 23   GLUCOSE 129* 88 92  BUN 13 27*  21  CREATININE 1.08 2.00* 1.50*  CALCIUM 9.7 8.7 8.3*   Liver Function Tests:  Recent Labs Lab 11/12/14 1810 11/13/14 1606 11/14/14 0254  AST 33 22 20  ALT 31 25 19   ALKPHOS 75 71 55  BILITOT 0.6 0.2* 0.6  PROT 9.4* 8.4* 6.4  ALBUMIN 5.0 4.5 3.5    Recent Labs Lab 11/12/14 1810 11/13/14 1606  LIPASE 47 62*   No results for input(s): AMMONIA in the last 168 hours. CBC:  Recent Labs Lab 11/12/14 1810 11/13/14 1606 11/13/14 2024 11/14/14 0224 11/14/14 0810  WBC 9.1 6.4 6.2 5.1 4.9  NEUTROABS 7.2 3.5  --   --   --   HGB 14.6 12.7 12.1 10.9* 11.0*  HCT 43.9 39.1 37.1 34.7* 34.2*  MCV 75.2* 77.4* 79.3 79.4 79.5   PLT 330 269 251 228 219   Cardiac Enzymes:  Recent Labs Lab 11/13/14 1606 11/13/14 2024 11/14/14 0224 11/14/14 0810  TROPONINI <0.03 <0.03 <0.03 <0.03   BNP (last 3 results) No results for input(s): BNP in the last 8760 hours.  ProBNP (last 3 results)  Recent Labs  09/14/14 2240  PROBNP 136.1*    CBG: No results for input(s): GLUCAP in the last 168 hours.  No results found for this or any previous visit (from the past 240 hour(s)).   Studies:  Recent x-ray studies have been reviewed in detail by the Attending Physician  Scheduled Meds:  Scheduled Meds: . cephALEXin  500 mg Oral QID  . diltiazem  240 mg Oral Daily  . gabapentin  300 mg Oral TID  . hydrALAZINE  25 mg Oral TID  . pantoprazole (PROTONIX) IV  40 mg Intravenous Q12H  . pravastatin  20 mg Oral Daily  . sodium chloride  3 mL Intravenous Q12H  . sucralfate  1 g Oral TID WC & HS   Continuous Infusions: . sodium chloride 125 mL/hr at 11/14/14 0500    Time spent on care of this patient: 58min   Imogen Maddalena, MD 11/14/2014, 11:42 AM  LOS: 1 day   [facility] Office  [phone] Pager - Text Page per [email or web address]  If 7PM-7AM, please contact night-coverage [email or web address]

## 2014-11-14 NOTE — Evaluation (Signed)
Occupational Therapy Evaluation Patient Details Name: Audrey Hill MRN: [id number] DOB: Feb 06, 1956 Today's Date: 11/14/2014    History of Present Illness Pt admitted with Nausea, vomiting, diarrhea, abdominal pain, hematemesis, increased urinary frequency   Clinical Impression   Pt admitted with n/v. Pt currently with functional limitations due to the deficits listed below (see OT Problem List).  Pt will benefit from skilled OT to increase their safety and independence with ADL and functional mobility for ADL to facilitate discharge to venue listed below.      Follow Up Recommendations  No OT follow up    Equipment Recommendations  None recommended by OT       Precautions / Restrictions Precautions Precautions: None Restrictions Weight Bearing Restrictions: No      Mobility Bed Mobility Overal bed mobility: Needs Assistance Bed Mobility: Supine to Sit     Supine to sit: Min assist        Transfers Overall transfer level: Needs assistance Equipment used: 1 person hand held assist Transfers: Sit to/from Stand Sit to Stand: Min assist                   ADL Overall ADL's : Needs assistance/impaired     Grooming: Sitting;Set up   Upper Body Bathing: Set up;Sitting   Lower Body Bathing: Moderate assistance;Sit to/from stand   Upper Body Dressing : Set up;Sitting   Lower Body Dressing: Sit to/from stand;Moderate assistance   Toilet Transfer: Minimal assistance Toilet Transfer Details (indicate cue type and reason): bed to chair     Tub/ Shower Transfer: Moderate assistance   Functional mobility during ADLs: Minimal assistance                 Pertinent Vitals/Pain Pain Assessment: 0-10 Pain Score: 4  Pain Location: abdomen Pain Descriptors / Indicators: Sore Pain Intervention(s): Repositioned;Premedicated before session     Hand Dominance     Extremity/Trunk Assessment Upper Extremity Assessment Upper Extremity Assessment:  Generalized weakness           Communication Communication Communication: No difficulties   Cognition Arousal/Alertness: Awake/alert Behavior During Therapy: WFL for tasks assessed/performed Overall Cognitive Status: Within Functional Limits for tasks assessed                                Home Living Family/patient expects to be discharged to:: Private residence Living Arrangements: Children Available Help at Discharge: Family Type of Home: Apartment Home Access: Stairs to enter Technical brewer of Steps: 2   Home Layout: One level     Bathroom Shower/Tub: Teacher, early years/pre: Standard                Prior Functioning/Environment Level of Independence: Independent             OT Diagnosis: Generalized weakness   OT Problem List: Decreased strength;Decreased activity tolerance   OT Treatment/Interventions: Self-care/ADL training;DME and/or AE instruction;Patient/family education    OT Goals(Current goals can be found in the care plan section) Acute Rehab OT Goals Patient Stated Goal: back home to apartment OT Goal Formulation: With patient Time For Goal Achievement: 11/28/14 Potential to Achieve Goals: Good  OT Frequency: Min 2X/week   Barriers to D/C:            Co-evaluation              End of Session Nurse Communication: Mobility status  Activity Tolerance: Patient  tolerated treatment well Patient left: in chair;with call bell/phone within reach   Time: 0957-1025 OT Time Calculation (min): 28 min Charges:  OT General Charges $OT Visit: 1 Procedure OT Evaluation $Initial OT Evaluation Tier I: 1 Procedure OT Treatments $Self Care/Home Management : 8-22 mins G-Codes:    Audrey Hill D 2014/12/13, 10:31 AM

## 2014-11-14 NOTE — Care Management Note (Addendum)
    Page 1 of 1   11/18/2014     12:04:28 PM CARE MANAGEMENT NOTE 11/18/2014  Patient:  Audrey Hill, Audrey Hill   Account Number:  [id number]  Date Initiated:  11/14/2014  Documentation initiated by:  Dessa Phi  Subjective/Objective Assessment:   59 y/o f admitted w/Hematemesis.     Action/Plan:   From home.   Anticipated DC Date:  11/18/2014   Anticipated DC Plan:  [place]  CM consult      Choice offered to / List presented to:             Status of service:  Completed, signed off Medicare Important Message given?   (If response is "NO", the following Medicare IM given date fields will be blank) Date Medicare IM given:   Medicare IM given by:   Date Additional Medicare IM given:   Additional Medicare IM given by:    Discharge Disposition:  HOME/SELF CARE  Per UR Regulation:  Reviewed for med. necessity/level of care/duration of stay  If discussed at [place] of Stay Meetings, dates discussed:    Comments:  11/18/14 Dessa Phi RN BSN NCM [phone] d/c home no needs or orders.  11/14/14 Dessa Phi RN BSN NCM [id number] egd today.No anticipated d/c needs.

## 2014-11-15 ENCOUNTER — Encounter (HOSPITAL_COMMUNITY): Payer: Self-pay | Admitting: Internal Medicine

## 2014-11-15 DIAGNOSIS — K2980 Duodenitis without bleeding: Principal | ICD-10-CM

## 2014-11-15 DIAGNOSIS — R1013 Epigastric pain: Secondary | ICD-10-CM | POA: Insufficient documentation

## 2014-11-15 DIAGNOSIS — I481 Persistent atrial fibrillation: Secondary | ICD-10-CM

## 2014-11-15 LAB — CBC
HCT: 32.6 % — ABNORMAL LOW (ref 36.0–46.0)
HCT: 34.7 % — ABNORMAL LOW (ref 36.0–46.0)
HCT: 31.2 % — ABNORMAL LOW (ref 36.0–46.0)
HCT: 31.4 % — ABNORMAL LOW (ref 36.0–46.0)
HEMOGLOBIN: 11 g/dL — AB (ref 12.0–15.0)
HEMOGLOBIN: 9.9 g/dL — AB (ref 12.0–15.0)
Hemoglobin: 10.3 g/dL — ABNORMAL LOW (ref 12.0–15.0)
Hemoglobin: 10 g/dL — ABNORMAL LOW (ref 12.0–15.0)
MCH: 25.1 pg — ABNORMAL LOW (ref 26.0–34.0)
MCH: 25.2 pg — AB (ref 26.0–34.0)
MCH: 25.4 pg — ABNORMAL LOW (ref 26.0–34.0)
MCH: 24.6 pg — AB (ref 26.0–34.0)
MCHC: 31.6 g/dL (ref 30.0–36.0)
MCHC: 31.7 g/dL (ref 30.0–36.0)
MCHC: 32.1 g/dL (ref 30.0–36.0)
MCHC: 31.5 g/dL (ref 30.0–36.0)
MCV: 79.3 fL (ref 78.0–100.0)
MCV: 79.6 fL (ref 78.0–100.0)
MCV: 79.2 fL (ref 78.0–100.0)
MCV: 78.1 fL (ref 78.0–100.0)
PLATELETS: 207 10*3/uL (ref 150–400)
Platelets: 211 10*3/uL (ref 150–400)
Platelets: 206 10*3/uL (ref 150–400)
Platelets: 195 10*3/uL (ref 150–400)
RBC: 4.11 MIL/uL (ref 3.87–5.11)
RBC: 4.36 MIL/uL (ref 3.87–5.11)
RBC: 3.94 MIL/uL (ref 3.87–5.11)
RBC: 4.02 MIL/uL (ref 3.87–5.11)
RDW: 14.8 % (ref 11.5–15.5)
RDW: 14.8 % (ref 11.5–15.5)
RDW: 14.7 % (ref 11.5–15.5)
RDW: 14.6 % (ref 11.5–15.5)
WBC: 5.1 10*3/uL (ref 4.0–10.5)
WBC: 4.6 10*3/uL (ref 4.0–10.5)
WBC: 4.6 10*3/uL (ref 4.0–10.5)
WBC: 4.2 10*3/uL (ref 4.0–10.5)

## 2014-11-15 LAB — BASIC METABOLIC PANEL
Anion gap: 6 (ref 5–15)
BUN: 13 mg/dL (ref 6–23)
CALCIUM: 8.1 mg/dL — AB (ref 8.4–10.5)
CHLORIDE: 115 mmol/L — AB (ref 96–112)
CO2: 20 mmol/L (ref 19–32)
Creatinine, Ser: 1.16 mg/dL — ABNORMAL HIGH (ref 0.50–1.10)
GFR calc non Af Amer: 50 mL/min — ABNORMAL LOW (ref >90)
GFR, EST AFRICAN AMERICAN: 58 mL/min — AB (ref >90)
GLUCOSE: 117 mg/dL — AB (ref 70–99)
POTASSIUM: 3.9 mmol/L (ref 3.5–5.1)
Sodium: 141 mmol/L (ref 135–145)

## 2014-11-15 LAB — HEPATIC FUNCTION PANEL
ALBUMIN: 3.5 g/dL (ref 3.5–5.2)
ALK PHOS: 55 U/L (ref 39–117)
ALT: 19 U/L (ref 0–35)
AST: 23 U/L (ref 0–37)
Bilirubin, Direct: 0.2 mg/dL (ref 0.0–0.5)
Indirect Bilirubin: 0.5 mg/dL (ref 0.3–0.9)
TOTAL PROTEIN: 6.4 g/dL (ref 6.0–8.3)
Total Bilirubin: 0.7 mg/dL (ref 0.3–1.2)

## 2014-11-15 LAB — URINE CULTURE
COLONY COUNT: NO GROWTH
CULTURE: NO GROWTH

## 2014-11-15 LAB — GLUCOSE, CAPILLARY: GLUCOSE-CAPILLARY: 82 mg/dL (ref 70–99)

## 2014-11-15 LAB — CLOSTRIDIUM DIFFICILE BY PCR: Toxigenic C. Difficile by PCR: NEGATIVE

## 2014-11-15 LAB — LIPASE, BLOOD: Lipase: 64 U/L — ABNORMAL HIGH (ref 11–59)

## 2014-11-15 MED ORDER — ONDANSETRON HCL 4 MG/2ML IJ SOLN
4.0000 mg | Freq: Four times a day (QID) | INTRAMUSCULAR | Status: DC | PRN
  Administered 2014-11-15 – 2014-11-16 (×2): 4 mg via INTRAVENOUS
  Filled 2014-11-15 (×3): qty 2

## 2014-11-15 MED ORDER — WARFARIN - PHARMACIST DOSING INPATIENT
Freq: Every day | Status: DC
  Administered 2014-11-15: 1
  Administered 2014-11-16: 18:00:00

## 2014-11-15 MED ORDER — LEVOFLOXACIN IN D5W 250 MG/50ML IV SOLN
250.0000 mg | Freq: Every day | INTRAVENOUS | Status: DC
  Administered 2014-11-15: 250 mg via INTRAVENOUS
  Filled 2014-11-15 (×2): qty 50

## 2014-11-15 MED ORDER — HYDROMORPHONE HCL 2 MG/ML IJ SOLN
2.0000 mg | INTRAMUSCULAR | Status: DC | PRN
  Administered 2014-11-15 – 2014-11-18 (×7): 2 mg via INTRAVENOUS
  Filled 2014-11-15 (×7): qty 1

## 2014-11-15 MED ORDER — LEVOFLOXACIN 250 MG PO TABS: 250.0000 mg | ORAL_TABLET | Freq: Every day | ORAL | Status: DC

## 2014-11-15 MED ORDER — WARFARIN SODIUM 7.5 MG PO TABS
7.5000 mg | ORAL_TABLET | Freq: Once | ORAL | Status: AC
  Administered 2014-11-15: 7.5 mg via ORAL
  Filled 2014-11-15 (×2): qty 1

## 2014-11-15 MED ORDER — ONDANSETRON HCL 4 MG/2ML IJ SOLN
4.0000 mg | Freq: Once | INTRAMUSCULAR | Status: AC
  Administered 2014-11-15: 4 mg via INTRAVENOUS
  Filled 2014-11-15: qty 2

## 2014-11-15 NOTE — Progress Notes (Signed)
Pt c/o "achy head and my rt eye aches". Swelling noted to lower outer aspect of rt eye. Cold pack offered but pt deferred at this time. Dr Wynelle Cleveland paged and made aware. No new orders at present. Pt has drifted off to sleep and appears comfortable as present. Will monitor.
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
## 2014-11-15 NOTE — Progress Notes (Signed)
[facility] Progress Note   Audrey Hill [id number] DOB: [birth year] DOA: 11/13/2014 PCP: Elizabeth Palau, MD  Brief narrative: Audrey Hill is a 59 y.o. female with past medical history of atrial fibrillation on Coumadin, hypertension, hyperlipidemia, GERD, asthma, hepatitis, hx of alcohol induced seizure, history of stroke without deficit, who presents with nausea, vomiting, diarrhea, abdominal pain, hematemesis and chest discomfort.   Subjective: Continues to have severe nausea, low grade fevers and body aches. No diarrhea.   Assessment/Plan: Principal Problem:   Hematemesis- anemia (likely chronic)  - n setting of persistant vomiting with an elevated INR due to coumadin use - she does have a h/o GERD listed in the chartg but is not on a PPI at home -  EGD reveals duodenal erosion- biopsy pending cont PPI - note stool guaiac was negative - has had a minor drop in Hb from 12 to 11 which could be attributed to dilution  Active Problems:    Nausea vomiting and diarrhea - suspecting gastroenteritis- symptoms persist- cont liquid diet and conservative management   Chest discomfort - likely secondary to vomiting and has resolved - troponin normal - EKG unchanged from prior with inverted T waves in 2,3, aVf, V5 and V6    A-fib CHA2DS2-VASc Score is 4 - in sinus rhythm- cont Cardizem and Coreg - coumadin can be resumed per GI recommendation- I have resumed it today  UTI - on Keflex as outpt - started on 2/9- now asking for IV antibiotic as she is having difficulty tolerating orals- will change to levaquin IV- culture result negative as she never had a culture sent on 2/9 and the culture from 2/11 is sterile as she was already on antibiotics     AKI (acute kidney injury) - due to dehydration- improving with IVF    Elevated lipase -mild elevated at 64-  likely a result of above mentioned GI issues and not due to acute pancreatitis    Hypertension -  cont CArdizem, Coreg and Hydalazine   Code Status: full code Family Communication:  Disposition Plan: home in 1-2 days DVT prophylaxis: on Coumadin with elevated INR  Consultants: GI  Procedures: ECG today  Antibiotics: Anti-infectives    Start     Dose/Rate Route Frequency Ordered Stop   11/15/14 1030  Levofloxacin (LEVAQUIN) IVPB 250 mg     250 mg 50 mL/hr over 60 Minutes Intravenous Daily 11/15/14 0919     11/15/14 1000  levofloxacin (LEVAQUIN) tablet 250 mg  Status:  Discontinued     250 mg Oral Daily 11/15/14 0914 11/15/14 0919   11/13/14 2200  cephALEXin (KEFLEX) capsule 500 mg  Status:  Discontinued     500 mg Oral 4 times daily 11/13/14 2023 11/15/14 0914         Objective: Filed Weights   11/13/14 1446  Weight: 113.399 kg (250 lb)    Intake/Output Summary (Last 24 hours) at 11/15/14 1011 Last data filed at 11/15/14 0744  Gross per 24 hour  Intake   2605 ml  Output   1350 ml  Net   1255 ml     Vitals Filed Vitals:   11/14/14 1620 11/14/14 2220 11/15/14 0501 11/15/14 0821  BP:  114/52 125/49 165/76  Pulse:  69 75 78  Temp:  98 F (36.7 C) 99.6 F (37.6 C) 100.2 F (37.9 C)  TempSrc:  Oral Oral Oral  Resp:  22 23 20   Height:      Weight:      SpO2:  99% 91% 96% 100%    Exam: General: AAO x3, No acute respiratory distress Lungs: Clear to auscultation bilaterally without wheezes or crackles Cardiovascular: Regular rate and rhythm without murmur gallop or rub normal S1 and S2 Abdomen: mild tenderness in upper abdomen, nondistended, soft, bowel sounds positive, no rebound, no ascites, no appreciable mass Extremities: No significant cyanosis, clubbing, or edema bilateral lower extremities  Data Reviewed: Basic Metabolic Panel:  Recent Labs Lab 11/12/14 1810 11/13/14 1606 11/14/14 0254 11/15/14 0445  NA 142 139 142 141  K 3.3* 3.5 3.4* 3.9  CL 106 110 115* 115*  CO2 26 25 23 20   GLUCOSE 129* 88 92 117*  BUN 13 27* 21 13  CREATININE 1.08  2.00* 1.50* 1.16*  CALCIUM 9.7 8.7 8.3* 8.1*   Liver Function Tests:  Recent Labs Lab 11/12/14 1810 11/13/14 1606 11/14/14 0254 11/15/14 0445  AST 33 22 20 23   ALT 31 25 19 19   ALKPHOS 75 71 55 55  BILITOT 0.6 0.2* 0.6 0.7  PROT 9.4* 8.4* 6.4 6.4  ALBUMIN 5.0 4.5 3.5 3.5    Recent Labs Lab 11/12/14 1810 11/13/14 1606 11/15/14 0445  LIPASE 47 62* 64*   No results for input(s): AMMONIA in the last 168 hours. CBC:  Recent Labs Lab 11/12/14 1810 11/13/14 1606 11/13/14 2024 11/14/14 0224 11/14/14 0810 11/14/14 2326 11/15/14 0445  WBC 9.1 6.4 6.2 5.1 4.9 5.1 4.6  NEUTROABS 7.2 3.5  --   --   --   --   --   HGB 14.6 12.7 12.1 10.9* 11.0* 10.3* 11.0*  HCT 43.9 39.1 37.1 34.7* 34.2* 32.6* 34.7*  MCV 75.2* 77.4* 79.3 79.4 79.5 79.3 79.6  PLT 330 269 251 228 219 207 211   Cardiac Enzymes:  Recent Labs Lab 11/13/14 1606 11/13/14 2024 11/14/14 0224 11/14/14 0810  TROPONINI <0.03 <0.03 <0.03 <0.03   BNP (last 3 results) No results for input(s): BNP in the last 8760 hours.  ProBNP (last 3 results)  Recent Labs  09/14/14 2240  PROBNP 136.1*    CBG:  Recent Labs Lab 11/14/14 1219 11/15/14 0815  GLUCAP 84 82    Recent Results (from the past 240 hour(s))  Culture, blood (routine x 2)     Status: None (Preliminary result)   Collection Time: 11/13/14  8:24 PM  Result Value Ref Range Status   Specimen Description BLOOD RIGHT WRIST  Final   Special Requests BOTTLES DRAWN AEROBIC ONLY 2CC  Final   Culture   Final           BLOOD CULTURE RECEIVED NO GROWTH TO DATE CULTURE WILL BE HELD FOR 5 DAYS BEFORE ISSUING A FINAL NEGATIVE REPORT Performed at [facility]    Report Status PENDING  Incomplete  Culture, blood (routine x 2)     Status: None (Preliminary result)   Collection Time: 11/13/14  8:29 PM  Result Value Ref Range Status   Specimen Description BLOOD RIGHT ARM  Final   Special Requests BOTTLES DRAWN AEROBIC AND ANAEROBIC 8CC  Final    Culture   Final           BLOOD CULTURE RECEIVED NO GROWTH TO DATE CULTURE WILL BE HELD FOR 5 DAYS BEFORE ISSUING A FINAL NEGATIVE REPORT Performed at [facility]    Report Status PENDING  Incomplete  Urine culture     Status: None   Collection Time: 11/13/14  9:30 PM  Result Value Ref Range Status   Specimen Description URINE,  RANDOM  Final   Special Requests NONE  Final   Colony Count NO GROWTH Performed at [facility]   Final   Culture NO GROWTH Performed at [facility]   Final   Report Status 11/15/2014 FINAL  Final     Studies:  Recent x-ray studies have been reviewed in detail by the Attending Physician  Scheduled Meds:  Scheduled Meds: . carvedilol  12.5 mg Oral BID WC  . diltiazem  240 mg Oral Daily  . gabapentin  300 mg Oral TID  . hydrALAZINE  25 mg Oral TID  . levofloxacin (LEVAQUIN) IV  250 mg Intravenous Daily  . pantoprazole (PROTONIX) IV  40 mg Intravenous Q12H  . pravastatin  20 mg Oral Daily  . sodium chloride  3 mL Intravenous Q12H  . sucralfate  1 g Oral TID WC & HS  . warfarin  7.5 mg Oral ONCE-1800  . Warfarin - Pharmacist Dosing Inpatient   Does not apply q1800   Continuous Infusions: . sodium chloride 125 mL/hr at 11/15/14 0150    Time spent on care of this patient: 28min   Jiovanni Heeter, MD 11/15/2014, 10:11 AM  LOS: 2 days   [facility] Office  [phone] Pager - Text Page per [email or web address]  If 7PM-7AM, please contact night-coverage [email or web address]

## 2014-11-15 NOTE — Progress Notes (Addendum)
Pt continues to c/o abdominal pain ,nausea and decrease appetite. EGD yesterday showed duodenitis, not likely to account for her pain. Ultrsound of the upper abdomen normal gall bladder. Pancreas appears normal on the CT scan but lipase has been elevated. I wonder if patient  having low grade pancreatitis. Will recheck amylase/lipase in am..

## 2014-11-16 ENCOUNTER — Inpatient Hospital Stay (HOSPITAL_COMMUNITY): Payer: Medicare Other

## 2014-11-16 DIAGNOSIS — I482 Chronic atrial fibrillation: Secondary | ICD-10-CM

## 2014-11-16 LAB — CBC
HEMATOCRIT: 33.5 % — AB (ref 36.0–46.0)
Hemoglobin: 10.6 g/dL — ABNORMAL LOW (ref 12.0–15.0)
MCH: 25 pg — ABNORMAL LOW (ref 26.0–34.0)
MCHC: 31.6 g/dL (ref 30.0–36.0)
MCV: 79 fL (ref 78.0–100.0)
PLATELETS: 221 10*3/uL (ref 150–400)
RBC: 4.24 MIL/uL (ref 3.87–5.11)
RDW: 14.7 % (ref 11.5–15.5)
WBC: 3.7 10*3/uL — ABNORMAL LOW (ref 4.0–10.5)

## 2014-11-16 LAB — BASIC METABOLIC PANEL
Anion gap: 3 — ABNORMAL LOW (ref 5–15)
BUN: 6 mg/dL (ref 6–23)
CO2: 26 mmol/L (ref 19–32)
CREATININE: 0.95 mg/dL (ref 0.50–1.10)
Calcium: 8.4 mg/dL (ref 8.4–10.5)
Chloride: 115 mmol/L — ABNORMAL HIGH (ref 96–112)
GFR calc Af Amer: 74 mL/min — ABNORMAL LOW (ref >90)
GFR calc non Af Amer: 64 mL/min — ABNORMAL LOW (ref >90)
Glucose, Bld: 104 mg/dL — ABNORMAL HIGH (ref 70–99)
Potassium: 3.8 mmol/L (ref 3.5–5.1)
Sodium: 144 mmol/L (ref 135–145)

## 2014-11-16 LAB — URINALYSIS, ROUTINE W REFLEX MICROSCOPIC
Bilirubin Urine: NEGATIVE
Glucose, UA: NEGATIVE mg/dL
Hgb urine dipstick: NEGATIVE
Ketones, ur: NEGATIVE mg/dL
LEUKOCYTES UA: NEGATIVE
Nitrite: NEGATIVE
PH: 5 (ref 5.0–8.0)
PROTEIN: NEGATIVE mg/dL
SPECIFIC GRAVITY, URINE: 1.011 (ref 1.005–1.030)
UROBILINOGEN UA: 0.2 mg/dL (ref 0.0–1.0)

## 2014-11-16 LAB — LIPASE, BLOOD: Lipase: 29 U/L (ref 11–59)

## 2014-11-16 LAB — GLUCOSE, CAPILLARY: Glucose-Capillary: 102 mg/dL — ABNORMAL HIGH (ref 70–99)

## 2014-11-16 LAB — TROPONIN I: Troponin I: <0.03 ng/mL (ref <0.031)

## 2014-11-16 LAB — PROTIME-INR
INR: 1.97 — ABNORMAL HIGH (ref 0.00–1.49)
Prothrombin Time: 22.6 seconds — ABNORMAL HIGH (ref 11.6–15.2)

## 2014-11-16 LAB — AMYLASE: Amylase: 101 U/L (ref 0–105)

## 2014-11-16 MED ORDER — FLUTICASONE PROPIONATE 50 MCG/ACT NA SUSP
2.0000 | Freq: Every day | NASAL | Status: DC
  Administered 2014-11-16 – 2014-11-18 (×3): 2 via NASAL
  Filled 2014-11-16: qty 16

## 2014-11-16 MED ORDER — OXYCODONE HCL 5 MG PO TABS
10.0000 mg | ORAL_TABLET | Freq: Four times a day (QID) | ORAL | Status: DC | PRN
  Administered 2014-11-16 – 2014-11-18 (×5): 10 mg via ORAL
  Filled 2014-11-16 (×5): qty 2

## 2014-11-16 MED ORDER — OXYCODONE-ACETAMINOPHEN 5-325 MG PO TABS: 2.0000 | ORAL_TABLET | Freq: Four times a day (QID) | ORAL | Status: DC

## 2014-11-16 MED ORDER — OXYCODONE HCL 5 MG PO TABS: 10.0000 mg | ORAL_TABLET | Freq: Four times a day (QID) | ORAL | Status: DC | PRN

## 2014-11-16 MED ORDER — WARFARIN SODIUM 7.5 MG PO TABS
7.5000 mg | ORAL_TABLET | Freq: Once | ORAL | Status: AC
  Administered 2014-11-16: 7.5 mg via ORAL
  Filled 2014-11-16: qty 1

## 2014-11-16 MED ORDER — OXYMETAZOLINE HCL 0.05 % NA SOLN: 1.0000 | NASAL | Status: DC

## 2014-11-16 MED ORDER — OXYMETAZOLINE HCL 0.05 % NA SOLN: 1.0000 | Freq: Two times a day (BID) | NASAL | Status: DC

## 2014-11-16 MED ORDER — HYDROMORPHONE HCL 2 MG/ML IJ SOLN
2.0000 mg | Freq: Once | INTRAMUSCULAR | Status: AC
  Administered 2014-11-16: 2 mg via INTRAVENOUS

## 2014-11-16 MED ORDER — OXYMETAZOLINE HCL 0.05 % NA SOLN
1.0000 | Freq: Two times a day (BID) | NASAL | Status: DC
  Administered 2014-11-16 – 2014-11-18 (×5): 1 via NASAL
  Filled 2014-11-16: qty 15

## 2014-11-16 MED ORDER — LORATADINE 10 MG PO TABS
10.0000 mg | ORAL_TABLET | Freq: Every day | ORAL | Status: DC
  Administered 2014-11-16 – 2014-11-18 (×3): 10 mg via ORAL
  Filled 2014-11-16 (×3): qty 1

## 2014-11-16 MED ORDER — POLYETHYLENE GLYCOL 3350 17 G PO PACK
17.0000 g | PACK | Freq: Every day | ORAL | Status: DC
  Administered 2014-11-16 – 2014-11-18 (×3): 17 g via ORAL
  Filled 2014-11-16 (×3): qty 1

## 2014-11-16 MED ORDER — ACETAMINOPHEN 325 MG PO TABS
650.0000 mg | ORAL_TABLET | ORAL | Status: DC
  Administered 2014-11-16 – 2014-11-18 (×13): 650 mg via ORAL
  Filled 2014-11-16 (×22): qty 2

## 2014-11-16 NOTE — Progress Notes (Signed)
Daily Rounding Note  11/16/2014, 9:14 AM  LOS: 3 days   SUBJECTIVE:       Multifocal pain, neck, head, back, thighs.  Her back/thigh pain is such that it is hard to sit to urinate.  Some nausea, no emesis. Soft, formed stool x one yesterday, none today.   Restless legs.    OBJECTIVE:         Vital signs in last 24 hours:    Temp:  [98.2 F (36.8 C)-98.9 F (37.2 C)] 98.4 F (36.9 C) (02/13 0500) Pulse Rate:  [68-77] 72 (02/13 0849) Resp:  [14-18] 18 (02/13 0849) BP: (123-168)/(54-85) 135/61 mmHg (02/13 0849) SpO2:  [95 %-99 %] 97 % (02/13 0849) Last BM Date: 11/15/14 Filed Weights   11/13/14 1446  Weight: 250 lb (113.399 kg)   General: anxious, moaning at times.  Able to redirect pt   Heart: RRR Chest: clear bil.  No sob or cough Abdomen: soft, NT, ND, obese.  Active BS  Extremities: no edema, + obese Neuro/Psych:  Anxious, tremors of LE.  No limb weakness.   Intake/Output from previous day: 02/12 0701 - 02/13 0700 In: 3167.1 [P.O.:240; I.V.:2877.1; IV Piggyback:50] Out: [id number] [Urine:5110]  Intake/Output this shift:    Lab Results:  Recent Labs  11/15/14 1040 11/15/14 1639 11/16/14 0555  WBC 4.6 4.2 3.7*  HGB 10.0* 9.9* 10.6*  HCT 31.2* 31.4* 33.5*  PLT 206 195 221   BMET  Recent Labs  11/13/14 1606 11/14/14 0254 11/15/14 0445  NA 139 142 141  K 3.5 3.4* 3.9  CL 110 115* 115*  CO2 25 23 20   GLUCOSE 88 92 117*  BUN 27* 21 13  CREATININE 2.00* 1.50* 1.16*  CALCIUM 8.7 8.3* 8.1*   LFT  Recent Labs  11/13/14 1606 11/14/14 0254 11/15/14 0445  PROT 8.4* 6.4 6.4  ALBUMIN 4.5 3.5 3.5  AST 22 20 23   ALT 25 19 19   ALKPHOS 71 55 55  BILITOT 0.2* 0.6 0.7  BILIDIR  --   --  0.2  IBILI  --   --  0.5   Lipase     Component Value Date/Time   LIPASE 29 11/16/2014 0555   Amylase    Component Value Date/Time   AMYLASE 101 11/16/2014 0555     PT/INR  Recent Labs  11/14/14 0254  11/16/14 0555  LABPROT 24.0* 22.6*  INR 2.12* 1.97*   Hepatitis Panel No results for input(s): HEPBSAG, HCVAB, HEPAIGM, HEPBIGM in the last 72 hours.  Studies/Results: US Abdomen Complete  11/15/2014   CLINICAL DATA:  Abdominal pain, nausea, vomiting and diarrhea since 11/12/2014.  EXAM: ULTRASOUND ABDOMEN COMPLETE  COMPARISON:  CT abdomen and pelvis 11/12/2014.  FINDINGS: Gallbladder: No gallstones or wall thickening visualized. No sonographic Murphy sign noted.  Common bile duct: Diameter: 0.5 cm.  Liver: No focal lesion identified. Within normal limits in parenchymal echogenicity.  IVC: No abnormality visualized.  Pancreas: Visualized portion unremarkable.  Spleen: Size and appearance within normal limits. Small cysts or hemangiomas in the spleen seen on prior CT scan are not visible on this examination.  Right Kidney: Length: 11.3 cm. Echogenicity within normal limits. No mass or hydronephrosis visualized.  Left Kidney: Length: 14.3 cm. Echogenicity within normal limits. 8.4 cm simple cyst is seen as on CT scan. No hydronephrosis visualized.  Abdominal aorta: No aneurysm visualized.  Other findings: None.  IMPRESSION: Negative for gallstones.  No acute abnormality.   Electronically Signed  By: Inge Rise M.D.   On: 11/15/2014 09:02   Scheduled Meds: . acetaminophen  650 mg Oral Q4H  . carvedilol  12.5 mg Oral BID WC  . diltiazem  240 mg Oral Daily  . fluticasone  2 spray Each Nare Daily  . gabapentin  300 mg Oral TID  . hydrALAZINE  25 mg Oral TID  . levofloxacin (LEVAQUIN) IV  250 mg Intravenous Daily  . loratadine  10 mg Oral Daily  . oxymetazoline  1 spray Each Nare Q4H  . pantoprazole (PROTONIX) IV  40 mg Intravenous Q12H  . polyethylene glycol  17 g Oral Daily  . pravastatin  20 mg Oral Daily  . sodium chloride  3 mL Intravenous Q12H  . sucralfate  1 g Oral TID WC & HS  . warfarin  7.5 mg Oral ONCE-1800  . Warfarin - Pharmacist Dosing Inpatient   Does not apply q1800    Continuous Infusions: . sodium chloride 125 mL/hr at 11/15/14 1143   PRN Meds:.HYDROmorphone (DILAUDID) injection, ondansetron, oxyCODONE, temazepam   ASSESMENT:   *  N/V/D, abdominal pain, hematemesis.  Duodenitis on 11/14/14 EGD.  On BID IV PPI, Carafate qid.  Lipase elevated to 62 but now normal.  Normal looking pancreas on CT.  C diff negative.  Difficult to sort out pt who has chronic pain, takes 10 of percocet q 6 hours, neurontin, at home.  Pain is all over.   Has ruled out for ACS with multiple negative troponins.   Think anxiety and underlying psych issues are playing prominent role here Also may have narcotics induced gastroparesis.   *  ARF, improved.   *  "UTI" 2/9, treated with Keflex initially, now on Levaquin: day 5 total abx. . No growth on urine or blood clx of 2/10. The initial U/A with few bacteria, 3 to 6 WBCs, negative nitrites, no leukocytes.   *  Anemia.  FOBT negative on 11/13/14.   *  Chronic Coumadin, hx afib and CVA .  INR subtherapeutic.   PLAN   *  Stop abx. Start scheduled oxy per home routine.  *  Stop Carafate. Continue the PPI.  *  Stop contact precautions, no diarrhea/c diff negative *  Heart diet. *  Consider antianxiety meds or psych eval.     Azucena Freed  11/16/2014, 9:14 AM Pager: [phone]

## 2014-11-16 NOTE — Progress Notes (Signed)
Evening dose of Coreg held. Pt is SB with HR 57-60.
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
## 2014-11-16 NOTE — Progress Notes (Signed)
[facility] Progress Note   Audrey Hill [id number] DOB: [birth year] DOA: 11/13/2014 PCP: Elizabeth Palau, MD  Brief narrative: Audrey Hill is a 59 y.o. female with past medical history of atrial fibrillation on Coumadin, hypertension, hyperlipidemia, GERD, asthma, hepatitis, hx of alcohol induced seizure, history of stroke without deficit, who presents with nausea, vomiting, diarrhea, abdominal pain, hematemesis and chest discomfort.   Subjective: Nausea improved- started having swelling of right lower eyelid and a "wooshing" sound in her left ear yesterday. She has a headache and pain in her face (points to maxillary areas bilaterally ) and continues to have body aches.   Assessment/Plan: Principal Problem:   Hematemesis- anemia (likely chronic)  - in setting of persistant vomiting with an elevated INR due to coumadin use - she does have a h/o GERD listed in the chart but is not on a PPI at home -  EGD reveals duodenal erosion- biopsy reveals chronic duodenitis -cont PPI - note stool guaiac was negative - has had a minor drop in Hb from 12 to 11 which could be attributed to dilution  Active Problems:    Nausea vomiting and diarrhea - suspecting gastroenteritis- symptoms improving- advance to solids today   Swelling right eye - noted to have lower eyelid edema, have taken pictures and sent to Dr Prudencio Burly who is on call for opthalmology today- he feels this is a non-urgent issue- he suspects she may be developing a chalazion and recommends warm compresses-  allergies is also in the differentia and he recommends an antihistamine- if no improvement seen by Monday, the patient can see him in the office.   Headache/ body aches/ left ear fullness and pain in b/l maxilla  -  possibly due to a viral infection vs sinusitis vs allergies - will cont conservative management - added Claritin in addition to pain medications - try afrin and Nasonex for sinus pain and ear  complaints.   Chest discomfort - likely secondary to vomiting - troponin normal - EKG unchanged from prior with inverted T waves in 2,3, aVf, V5 and V6    A-fib CHA2DS2-VASc Score is 4 - in sinus rhythm- cont Cardizem and Coreg - coumadin can be resumed per GI recommendation- I have resumed it today  UTI - on Keflex as outpt - started on 2/9- now asking for IV antibiotic as she is having difficulty tolerating orals-  changed to levaquin IV- culture result negative as she never had a culture sent on 2/9 and the culture from 2/11 is sterile as she was already on antibiotics when it was obtained    AKI (acute kidney injury) - due to dehydration- improving with IVF    Elevated lipase -mild elevated at 64-  likely a result of above mentioned GI issues and not due to acute pancreatitis - lipase has normalized today    Hypertension - cont CArdizem, Coreg and Hydalazine   Code Status: full code Family Communication:  Disposition Plan: home in 1-2 days DVT prophylaxis: on Coumadin   Consultants: GI  Procedures: ECG today  Antibiotics: Anti-infectives    Start     Dose/Rate Route Frequency Ordered Stop   11/15/14 1030  Levofloxacin (LEVAQUIN) IVPB 250 mg  Status:  Discontinued     250 mg 50 mL/hr over 60 Minutes Intravenous Daily 11/15/14 0919 11/16/14 0925   11/15/14 1000  levofloxacin (LEVAQUIN) tablet 250 mg  Status:  Discontinued     250 mg Oral Daily 11/15/14 0914 11/15/14 0919   11/13/14 2200  cephALEXin (KEFLEX) capsule 500 mg  Status:  Discontinued     500 mg Oral 4 times daily 11/13/14 2023 11/15/14 0914         Objective: Filed Weights   11/13/14 1446  Weight: 113.399 kg (250 lb)    Intake/Output Summary (Last 24 hours) at 11/16/14 0947 Last data filed at 11/16/14 0700  Gross per 24 hour  Intake 3167.08 ml  Output   4610 ml  Net -1442.92 ml     Vitals Filed Vitals:   11/15/14 2100 11/16/14 0404 11/16/14 0500 11/16/14 0849  BP: 147/68 147/77 168/85  135/61  Pulse: 71 68 77 72  Temp: 98.9 F (37.2 C) 98.2 F (36.8 C) 98.4 F (36.9 C)   TempSrc: Oral Oral Oral   Resp: 18 18 18 18   Height:      Weight:      SpO2: 99% 99% 99% 97%    Exam: General: AAO x3, No acute respiratory distress, swelling or right lower eyelid noted, no conjunctival abnormalities Lungs: Clear to auscultation bilaterally without wheezes or crackles Cardiovascular: Regular rate and rhythm without murmur gallop or rub normal S1 and S2 Abdomen: mild tenderness in upper abdomen, nondistended, soft, bowel sounds positive, no rebound, no ascites, no appreciable mass Extremities: No significant cyanosis, clubbing, or edema bilateral lower extremities  Data Reviewed: Basic Metabolic Panel:  Recent Labs Lab 11/12/14 1810 11/13/14 1606 11/14/14 0254 11/15/14 0445  NA 142 139 142 141  K 3.3* 3.5 3.4* 3.9  CL 106 110 115* 115*  CO2 26 25 23 20   GLUCOSE 129* 88 92 117*  BUN 13 27* 21 13  CREATININE 1.08 2.00* 1.50* 1.16*  CALCIUM 9.7 8.7 8.3* 8.1*   Liver Function Tests:  Recent Labs Lab 11/12/14 1810 11/13/14 1606 11/14/14 0254 11/15/14 0445  AST 33 22 20 23   ALT 31 25 19 19   ALKPHOS 75 71 55 55  BILITOT 0.6 0.2* 0.6 0.7  PROT 9.4* 8.4* 6.4 6.4  ALBUMIN 5.0 4.5 3.5 3.5    Recent Labs Lab 11/12/14 1810 11/13/14 1606 11/15/14 0445 11/16/14 0555  LIPASE 47 62* 64* 29  AMYLASE  --   --   --  101   No results for input(s): AMMONIA in the last 168 hours. CBC:  Recent Labs Lab 11/12/14 1810 11/13/14 1606  11/14/14 2326 11/15/14 0445 11/15/14 1040 11/15/14 1639 11/16/14 0555  WBC 9.1 6.4  < > 5.1 4.6 4.6 4.2 3.7*  NEUTROABS 7.2 3.5  --   --   --   --   --   --   HGB 14.6 12.7  < > 10.3* 11.0* 10.0* 9.9* 10.6*  HCT 43.9 39.1  < > 32.6* 34.7* 31.2* 31.4* 33.5*  MCV 75.2* 77.4*  < > 79.3 79.6 79.2 78.1 79.0  PLT 330 269  < > 207 211 206 195 221  < > = values in this interval not displayed. Cardiac Enzymes:  Recent Labs Lab  11/13/14 1606 11/13/14 2024 11/14/14 0224 11/14/14 0810 11/16/14 0555  TROPONINI <0.03 <0.03 <0.03 <0.03 <0.03   BNP (last 3 results) No results for input(s): BNP in the last 8760 hours.  ProBNP (last 3 results)  Recent Labs  09/14/14 2240  PROBNP 136.1*    CBG:  Recent Labs Lab 11/14/14 1219 11/15/14 0815  GLUCAP 84 82    Recent Results (from the past 240 hour(s))  Culture, blood (routine x 2)     Status: None (Preliminary result)   Collection Time: 11/13/14  8:24 PM  Result Value Ref Range Status   Specimen Description BLOOD RIGHT WRIST  Final   Special Requests BOTTLES DRAWN AEROBIC ONLY 2CC  Final   Culture   Final           BLOOD CULTURE RECEIVED NO GROWTH TO DATE CULTURE WILL BE HELD FOR 5 DAYS BEFORE ISSUING A FINAL NEGATIVE REPORT Performed at [facility]    Report Status PENDING  Incomplete  Culture, blood (routine x 2)     Status: None (Preliminary result)   Collection Time: 11/13/14  8:29 PM  Result Value Ref Range Status   Specimen Description BLOOD RIGHT ARM  Final   Special Requests BOTTLES DRAWN AEROBIC AND ANAEROBIC 8CC  Final   Culture   Final           BLOOD CULTURE RECEIVED NO GROWTH TO DATE CULTURE WILL BE HELD FOR 5 DAYS BEFORE ISSUING A FINAL NEGATIVE REPORT Performed at [facility]    Report Status PENDING  Incomplete  Urine culture     Status: None   Collection Time: 11/13/14  9:30 PM  Result Value Ref Range Status   Specimen Description URINE, RANDOM  Final   Special Requests NONE  Final   Colony Count NO GROWTH Performed at [facility]   Final   Culture NO GROWTH Performed at [facility]   Final   Report Status 11/15/2014 FINAL  Final  Clostridium Difficile by PCR     Status: None   Collection Time: 11/15/14  2:07 PM  Result Value Ref Range Status   C difficile by pcr NEGATIVE NEGATIVE Final    Comment: Performed at [facility]     Studies:  Recent x-ray studies have been  reviewed in detail by the Attending Physician  Scheduled Meds:  Scheduled Meds: . acetaminophen  650 mg Oral Q4H  . carvedilol  12.5 mg Oral BID WC  . diltiazem  240 mg Oral Daily  . fluticasone  2 spray Each Nare Daily  . gabapentin  300 mg Oral TID  . hydrALAZINE  25 mg Oral TID  . loratadine  10 mg Oral Daily  . oxyCODONE-acetaminophen  2 tablet Oral 4 times per day  . oxymetazoline  1 spray Each Nare Q4H  . pantoprazole (PROTONIX) IV  40 mg Intravenous Q12H  . polyethylene glycol  17 g Oral Daily  . pravastatin  20 mg Oral Daily  . sodium chloride  3 mL Intravenous Q12H  . sucralfate  1 g Oral TID WC & HS  . warfarin  7.5 mg Oral ONCE-1800  . Warfarin - Pharmacist Dosing Inpatient   Does not apply q1800   Continuous Infusions: . sodium chloride 125 mL/hr at 11/15/14 1143    Time spent on care of this patient: 2min   Roczen Waymire, MD 11/16/2014, 9:47 AM  LOS: 3 days   [facility] Office  [phone] Pager - Text Page per [email or web address]  If 7PM-7AM, please contact night-coverage [email or web address]

## 2014-11-17 ENCOUNTER — Inpatient Hospital Stay (HOSPITAL_COMMUNITY): Payer: Medicare Other

## 2014-11-17 ENCOUNTER — Encounter (HOSPITAL_COMMUNITY): Payer: Self-pay | Admitting: Radiology

## 2014-11-17 DIAGNOSIS — R1013 Epigastric pain: Secondary | ICD-10-CM

## 2014-11-17 LAB — PROTIME-INR
INR: 1.8 — ABNORMAL HIGH (ref 0.00–1.49)
Prothrombin Time: 21.1 seconds — ABNORMAL HIGH (ref 11.6–15.2)

## 2014-11-17 LAB — BASIC METABOLIC PANEL
Anion gap: 3 — ABNORMAL LOW (ref 5–15)
BUN: 8 mg/dL (ref 6–23)
CO2: 27 mmol/L (ref 19–32)
Calcium: 8.5 mg/dL (ref 8.4–10.5)
Chloride: 111 mmol/L (ref 96–112)
Creatinine, Ser: 0.88 mg/dL (ref 0.50–1.10)
GFR calc Af Amer: 81 mL/min — ABNORMAL LOW (ref >90)
GFR, EST NON AFRICAN AMERICAN: 70 mL/min — AB (ref >90)
Glucose, Bld: 116 mg/dL — ABNORMAL HIGH (ref 70–99)
Potassium: 3.6 mmol/L (ref 3.5–5.1)
Sodium: 141 mmol/L (ref 135–145)

## 2014-11-17 LAB — GLUCOSE, CAPILLARY: Glucose-Capillary: 103 mg/dL — ABNORMAL HIGH (ref 70–99)

## 2014-11-17 MED ORDER — SODIUM CHLORIDE 0.9 % IV SOLN: INTRAVENOUS | Status: DC

## 2014-11-17 MED ORDER — ONDANSETRON HCL 4 MG/2ML IJ SOLN
4.0000 mg | Freq: Four times a day (QID) | INTRAMUSCULAR | Status: DC
  Administered 2014-11-17 – 2014-11-18 (×4): 4 mg via INTRAVENOUS
  Filled 2014-11-17 (×4): qty 2

## 2014-11-17 MED ORDER — PANTOPRAZOLE SODIUM 40 MG PO TBEC
40.0000 mg | DELAYED_RELEASE_TABLET | Freq: Every day | ORAL | Status: DC
  Administered 2014-11-17 – 2014-11-18 (×2): 40 mg via ORAL
  Filled 2014-11-17 (×2): qty 1

## 2014-11-17 MED ORDER — WARFARIN SODIUM 10 MG PO TABS
10.0000 mg | ORAL_TABLET | Freq: Once | ORAL | Status: AC
  Administered 2014-11-17: 10 mg via ORAL
  Filled 2014-11-17: qty 1

## 2014-11-17 MED ORDER — ONDANSETRON HCL 4 MG/2ML IJ SOLN: 8.0000 mg | Freq: Four times a day (QID) | INTRAMUSCULAR | Status: DC

## 2014-11-17 NOTE — Progress Notes (Addendum)
[facility] Progress Note   Audrey Hill [id number] DOB: Jun 16, 1956 DOA: 11/13/2014 PCP: Elizabeth Palau, MD  Brief narrative: Audrey Hill is a 59 y.o. female with past medical history of atrial fibrillation on Coumadin, hypertension, hyperlipidemia, GERD, asthma, hepatitis, hx of alcohol induced seizure, history of stroke without deficit, who presents with nausea, vomiting, diarrhea, abdominal pain, hematemesis and chest discomfort.   Subjective: Still having a great deal of body aches and a headache. feels she is not ready to go home- needs the IV Dilaudid to keep her pain controlled.   Assessment/Plan: Principal Problem:   Hematemesis- anemia (likely chronic)  - in setting of persistant vomiting with an elevated INR due to coumadin use - she does have a h/o GERD listed in the chart but is not on a PPI at home -  EGD reveals duodenal erosion- biopsy reveals chronic duodenitis -cont PPI - note stool guaiac was negative - has had a minor drop in Hb from 12 to 11 which could be attributed to dilution  Active Problems:    Nausea vomiting and diarrhea - suspecting gastroenteritis- symptoms improving- advanced to solids- tolerating a diet now   Swelling right eye - noted to have lower eyelid edema, have taken pictures and sent to Dr Prudencio Burly who is on call for opthalmology today- he feels this is a non-urgent issue- he suspects she may be developing a chalazion and recommends warm compresses-  allergies is also in the differentia and he recommends an antihistamine- if no improvement seen by Monday, the patient can see him in the office.  - on exam today, the swelling is noted to be improving  Headache/ body aches/ left ear fullness and pain in b/l maxilla  -  possibly due to a viral infection vs sinusitis vs allergies - will cont conservative management - added Claritin in addition to pain medications - cont afrin and Nasonex for sinus pain and ear complaints.   - as patient is essentially begging for Dilaudid to not be discontinued as it is the only medication that will control her pain, I will continue it for now - obtain CT head for ongoing headache.   Chest discomfort - likely secondary to vomiting - troponin normal - EKG unchanged from prior with inverted T waves in 2,3, aVf, V5 and V6    A-fib CHA2DS2-VASc Score is 4 - in sinus rhythm- cont Cardizem and Coreg - coumadin can be resumed per GI recommendation- I have resumed it today  UTI - on Keflex as outpt - started on 2/9- now asking for IV antibiotic as she is having difficulty tolerating orals-  changed to levaquin IV- culture result negative as she never had a culture sent on 2/9 and the culture from 2/11 is sterile as she was already on antibiotics when it was obtained - repeat UA negative- Levaquin discontinued    AKI (acute kidney injury) - due to dehydration- improving with IVF    Elevated lipase -mild elevated at 64-  likely a result of above mentioned GI issues and not due to acute pancreatitis - lipase has normalized today    Hypertension - cont CArdizem, Coreg and Hydalazine   Code Status: full code Family Communication:  Disposition Plan: home in 1-2 days DVT prophylaxis: on Coumadin   Consultants: GI  Procedures: ECG today  Antibiotics: Anti-infectives    Start     Dose/Rate Route Frequency Ordered Stop   11/15/14 1030  Levofloxacin (LEVAQUIN) IVPB 250 mg  Status:  Discontinued  250 mg 50 mL/hr over 60 Minutes Intravenous Daily 11/15/14 0919 11/16/14 0925   11/15/14 1000  levofloxacin (LEVAQUIN) tablet 250 mg  Status:  Discontinued     250 mg Oral Daily 11/15/14 0914 11/15/14 0919   11/13/14 2200  cephALEXin (KEFLEX) capsule 500 mg  Status:  Discontinued     500 mg Oral 4 times daily 11/13/14 2023 11/15/14 0914         Objective: Filed Weights   11/13/14 1446  Weight: 113.399 kg (250 lb)    Intake/Output Summary (Last 24 hours) at 11/17/14  1145 Last data filed at 11/17/14 1125  Gross per 24 hour  Intake   2600 ml  Output   4802 ml  Net  -2202 ml     Vitals Filed Vitals:   11/16/14 1758 11/16/14 2100 11/17/14 0431 11/17/14 0959  BP: 124/50 134/62 144/73 152/80  Pulse: 57 64 63   Temp:  98.4 F (36.9 C) 98 F (36.7 C)   TempSrc:  Oral Oral   Resp: 14 20 20    Height:      Weight:      SpO2: 96% 98% 94%     Exam: General: AAO x3, No acute respiratory distress, swelling or right lower eyelid noted, no conjunctival abnormalities Lungs: Clear to auscultation bilaterally without wheezes or crackles Cardiovascular: Regular rate and rhythm without murmur gallop or rub normal S1 and S2 Abdomen: mild tenderness in upper abdomen, nondistended, soft, bowel sounds positive, no rebound, no ascites, no appreciable mass Extremities: No significant cyanosis, clubbing, or edema bilateral lower extremities  Data Reviewed: Basic Metabolic Panel:  Recent Labs Lab 11/13/14 1606 11/14/14 0254 11/15/14 0445 11/16/14 0908 11/17/14 0439  NA 139 142 141 144 141  K 3.5 3.4* 3.9 3.8 3.6  CL 110 115* 115* 115* 111  CO2 25 23 20 26 27   GLUCOSE 88 92 117* 104* 116*  BUN 27* 21 13 6 8   CREATININE 2.00* 1.50* 1.16* 0.95 0.88  CALCIUM 8.7 8.3* 8.1* 8.4 8.5   Liver Function Tests:  Recent Labs Lab 11/12/14 1810 11/13/14 1606 11/14/14 0254 11/15/14 0445  AST 33 22 20 23   ALT 31 25 19 19   ALKPHOS 75 71 55 55  BILITOT 0.6 0.2* 0.6 0.7  PROT 9.4* 8.4* 6.4 6.4  ALBUMIN 5.0 4.5 3.5 3.5    Recent Labs Lab 11/12/14 1810 11/13/14 1606 11/15/14 0445 11/16/14 0555  LIPASE 47 62* 64* 29  AMYLASE  --   --   --  101   No results for input(s): AMMONIA in the last 168 hours. CBC:  Recent Labs Lab 11/12/14 1810 11/13/14 1606  11/14/14 2326 11/15/14 0445 11/15/14 1040 11/15/14 1639 11/16/14 0555  WBC 9.1 6.4  < > 5.1 4.6 4.6 4.2 3.7*  NEUTROABS 7.2 3.5  --   --   --   --   --   --   HGB 14.6 12.7  < > 10.3* 11.0* 10.0*  9.9* 10.6*  HCT 43.9 39.1  < > 32.6* 34.7* 31.2* 31.4* 33.5*  MCV 75.2* 77.4*  < > 79.3 79.6 79.2 78.1 79.0  PLT 330 269  < > 207 211 206 195 221  < > = values in this interval not displayed. Cardiac Enzymes:  Recent Labs Lab 11/13/14 1606 11/13/14 2024 11/14/14 0224 11/14/14 0810 11/16/14 0555  TROPONINI <0.03 <0.03 <0.03 <0.03 <0.03   BNP (last 3 results) No results for input(s): BNP in the last 8760 hours.  ProBNP (last 3 results)  Recent Labs  09/14/14 2240  PROBNP 136.1*    CBG:  Recent Labs Lab 11/14/14 1219 11/15/14 0815 11/16/14 0756  GLUCAP 84 82 102*    Recent Results (from the past 240 hour(s))  Culture, blood (routine x 2)     Status: None (Preliminary result)   Collection Time: 11/13/14  8:24 PM  Result Value Ref Range Status   Specimen Description BLOOD RIGHT WRIST  Final   Special Requests BOTTLES DRAWN AEROBIC ONLY 2CC  Final   Culture   Final           BLOOD CULTURE RECEIVED NO GROWTH TO DATE CULTURE WILL BE HELD FOR 5 DAYS BEFORE ISSUING A FINAL NEGATIVE REPORT Performed at [facility]    Report Status PENDING  Incomplete  Culture, blood (routine x 2)     Status: None (Preliminary result)   Collection Time: 11/13/14  8:29 PM  Result Value Ref Range Status   Specimen Description BLOOD RIGHT ARM  Final   Special Requests BOTTLES DRAWN AEROBIC AND ANAEROBIC 8CC  Final   Culture   Final           BLOOD CULTURE RECEIVED NO GROWTH TO DATE CULTURE WILL BE HELD FOR 5 DAYS BEFORE ISSUING A FINAL NEGATIVE REPORT Performed at [facility]    Report Status PENDING  Incomplete  Urine culture     Status: None   Collection Time: 11/13/14  9:30 PM  Result Value Ref Range Status   Specimen Description URINE, RANDOM  Final   Special Requests NONE  Final   Colony Count NO GROWTH Performed at [facility]   Final   Culture NO GROWTH Performed at [facility]   Final   Report Status 11/15/2014 FINAL  Final   Clostridium Difficile by PCR     Status: None   Collection Time: 11/15/14  2:07 PM  Result Value Ref Range Status   C difficile by pcr NEGATIVE NEGATIVE Final    Comment: Performed at [facility]     Studies:  Recent x-ray studies have been reviewed in detail by the Attending Physician  Scheduled Meds:  Scheduled Meds: . acetaminophen  650 mg Oral Q4H  . carvedilol  12.5 mg Oral BID WC  . diltiazem  240 mg Oral Daily  . fluticasone  2 spray Each Nare Daily  . gabapentin  300 mg Oral TID  . hydrALAZINE  25 mg Oral TID  . loratadine  10 mg Oral Daily  . ondansetron  4 mg Intravenous 4 times per day  . oxymetazoline  1 spray Each Nare BID  . pantoprazole  40 mg Oral Daily  . polyethylene glycol  17 g Oral Daily  . pravastatin  20 mg Oral Daily  . sodium chloride  3 mL Intravenous Q12H  . warfarin  10 mg Oral ONCE-1800  . Warfarin - Pharmacist Dosing Inpatient   Does not apply q1800   Continuous Infusions: . sodium chloride 50 mL/hr at 11/17/14 1003    Time spent on care of this patient: 36min   Dariane Natzke, MD 11/17/2014, 11:45 AM  LOS: 4 days   [facility] Office  [phone] Pager - Text Page per [email or web address]  If 7PM-7AM, please contact night-coverage [email or web address]

## 2014-11-17 NOTE — Progress Notes (Signed)
ANTICOAGULATION CONSULT NOTE - follow up  Pharmacy Consult for warfarin Indication: atrial fibrillation  Allergies  Allergen Reactions  . Ultram [Tramadol] Nausea And Vomiting  . Ibuprofen Other (See Comments)    Upset gi  . Nitroglycerin Hives  . Norvasc [Amlodipine Besylate] Swelling  . Aspirin Rash  . Lisinopril Rash    Patient Measurements: Height: 5\' 6"  (167.6 cm) Weight: 250 lb (113.399 kg) IBW/kg (Calculated) : 59.3   Vital Signs: Temp: 98 F (36.7 C) (02/14 0431) Temp Source: Oral (02/14 0431) BP: 144/73 mmHg (02/14 0431) Pulse Rate: 63 (02/14 0431)  Labs:  Recent Labs  11/15/14 0445 11/15/14 1040 11/15/14 1639 11/16/14 0555 11/16/14 0908 11/17/14 0439  HGB 11.0* 10.0* 9.9* 10.6*  --   --   HCT 34.7* 31.2* 31.4* 33.5*  --   --   PLT 211 206 195 221  --   --   LABPROT  --   --   --  22.6*  --  21.1*  INR  --   --   --  1.97*  --  1.80*  CREATININE 1.16*  --   --   --  0.95 0.88  TROPONINI  --   --   --  <0.03  --   --     Estimated Creatinine Clearance: 86.8 mL/min (by C-G formula based on Cr of 0.88).   Medications:  Scheduled:  . acetaminophen  650 mg Oral Q4H  . carvedilol  12.5 mg Oral BID WC  . diltiazem  240 mg Oral Daily  . fluticasone  2 spray Each Nare Daily  . gabapentin  300 mg Oral TID  . hydrALAZINE  25 mg Oral TID  . loratadine  10 mg Oral Daily  . oxymetazoline  1 spray Each Nare BID  . pantoprazole (PROTONIX) IV  40 mg Intravenous Q12H  . polyethylene glycol  17 g Oral Daily  . pravastatin  20 mg Oral Daily  . sodium chloride  3 mL Intravenous Q12H  . sucralfate  1 g Oral TID WC & HS  . Warfarin - Pharmacist Dosing Inpatient   Does not apply q1800    Assessment: 59 y.o. female presented 2/10 with N/V/D, abdominal pain, hematemesis and chest discomfort.  PMH includes atrial fibrillation on warfarin, hypertension, hyperlipidemia, GERD, asthma, hepatitis, alcohol induced seizure, and stroke w/o residual deficit.  Warfarin was  initially held for hematemesis.  EGD was w/o bleeding and GI recommends restarting anticoagulation.  Pharmacy was consulted to resume dosing on 2/12.  Home regimen: 7.5mg  M,W,F,Sat and 5mg  Tue,Thur,Sun (Last dose 2/9 PTA) INR 1.9 on admission  Today, 11/17/2014 :  INR 1.8, Subtherapeutic   Held doses on 2/10-2/11  CBC: Hgb 10.6, Plt 221 (2/13)  No bleeding or complications reported  Diet: Cardiac  Drug interactions: None (Levaquin given 2/12 - 2/13)   Goal of Therapy:  INR 2-3   Plan:   Warfarin 10 mg po x 1 @ 1800 Boosted dose today, likely return to PTA dosage when INR > 2  Daily INR   CBC at least q72h  For discharge, recommend resuming home regimen with INR recheck in 3-4 days.   Gretta Arab PharmD, BCPS Pager [phone] 11/17/2014 9:04 AM

## 2014-11-17 NOTE — Progress Notes (Signed)
   Subjective  Says  She is having lot of pain,  tolarated diet, asking for Dilaudid IV   Objective  Gi evaluation included EGD, ultrasound, CT scan, labs- mild elevation of amylase. Pt's complains are out of proportion to objective findigns. Will stop IV Dilaudid, use oral meds.  Vital signs in last 24 hours: Temp:  [98 F (36.7 C)-98.4 F (36.9 C)] 98 F (36.7 C) (02/14 0431) Pulse Rate:  [57-67] 63 (02/14 0431) Resp:  [14-20] 20 (02/14 0431) BP: (124-165)/(50-79) 144/73 mmHg (02/14 0431) SpO2:  [94 %-98 %] 94 % (02/14 0431) Last BM Date: 11/16/14 General:    AA female icomplaining of  Body pains Heart:  Regular rate and rhythm; no murmurs Lungs: Respirations even and unlabored, lungs CTA bilaterally Abdomen:  Soft,diffuse mildnondistended. Normal bowel sounds. Extremities:  Without edema. Neurologic:  Alert and oriented,  grossly normal neurologically. Psych:  Cooperative. Appears mildly distressed  Intake/Output from previous day: 02/13 0701 - 02/14 0700 In: 2360 [P.O.:360; I.V.:2000] Out: 4252 [Urine:4250; Stool:2] Intake/Output this shift:    Lab Results:  Recent Labs  11/15/14 1040 11/15/14 1639 11/16/14 0555  WBC 4.6 4.2 3.7*  HGB 10.0* 9.9* 10.6*  HCT 31.2* 31.4* 33.5*  PLT 206 195 221   BMET  Recent Labs  11/15/14 0445 11/16/14 0908 11/17/14 0439  NA 141 144 141  K 3.9 3.8 3.6  CL 115* 115* 111  CO2 20 26 27   GLUCOSE 117* 104* 116*  BUN 13 6 8   CREATININE 1.16* 0.95 0.88  CALCIUM 8.1* 8.4 8.5   LFT  Recent Labs  11/15/14 0445  PROT 6.4  ALBUMIN 3.5  AST 23  ALT 19  ALKPHOS 55  BILITOT 0.7  BILIDIR 0.2  IBILI 0.5   PT/INR  Recent Labs  11/16/14 0555 11/17/14 0439  LABPROT 22.6* 21.1*  INR 1.97* 1.80*    Studies/Results: Dg Abd 1 View  11/16/2014   CLINICAL DATA:  Right upper quadrant abdominal pain this morning. No nausea or vomiting.  EXAM: ABDOMEN - 1 VIEW  COMPARISON:  CT of the abdomen and pelvis 11/12/2014  FINDINGS:  There is minimal residual contrast in the colon following recent CT exam. Bowel gas pattern is nonobstructive. There is minimal degenerative change in the spine.  IMPRESSION: Nonobstructive bowel gas pattern.   Electronically Signed   By: Nolon Nations M.D.   On: 11/16/2014 14:00    Assessment / Plan:     Principal Problem:   Hematemesis Active Problems:   Hypertension   Hyperlipidemia   GERD (gastroesophageal reflux disease)   Asthma, chronic   Nausea vomiting and diarrhea   History of stroke   A-fib   AKI (acute kidney injury)   Elevated lipase   Duodenitis   Epigastric abdominal pain  Pt may have reached maximum hospital benefit. D/C IV Dilaudid Continue Protonix po Oral pain meds  no further GI work up. Will sign off   LOS: 4 days   Delfin Edis  11/17/2014, 9:53 AM

## 2014-11-18 LAB — CBC
HEMATOCRIT: 33.3 % — AB (ref 36.0–46.0)
HEMOGLOBIN: 10.7 g/dL — AB (ref 12.0–15.0)
MCH: 25.3 pg — AB (ref 26.0–34.0)
MCHC: 32.1 g/dL (ref 30.0–36.0)
MCV: 78.7 fL (ref 78.0–100.0)
Platelets: 228 10*3/uL (ref 150–400)
RBC: 4.23 MIL/uL (ref 3.87–5.11)
RDW: 14.6 % (ref 11.5–15.5)
WBC: 3.9 10*3/uL — ABNORMAL LOW (ref 4.0–10.5)

## 2014-11-18 LAB — PROTIME-INR
INR: 2.07 — AB (ref 0.00–1.49)
PROTHROMBIN TIME: 23.4 s — AB (ref 11.6–15.2)

## 2014-11-18 LAB — GLUCOSE, CAPILLARY: GLUCOSE-CAPILLARY: 79 mg/dL (ref 70–99)

## 2014-11-18 MED ORDER — PANTOPRAZOLE SODIUM 40 MG PO TBEC: 40.0000 mg | DELAYED_RELEASE_TABLET | Freq: Every day | ORAL | Status: DC

## 2014-11-18 MED ORDER — WARFARIN SODIUM 7.5 MG PO TABS
7.5000 mg | ORAL_TABLET | Freq: Once | ORAL | Status: DC
  Filled 2014-11-18: qty 1

## 2014-11-18 MED ORDER — OXYCODONE-ACETAMINOPHEN 5-325 MG PO TABS: 1.0000 | ORAL_TABLET | ORAL | Status: DC | PRN

## 2014-11-18 MED ORDER — OXYCODONE HCL 5 MG PO TABS
10.0000 mg | ORAL_TABLET | Freq: Once | ORAL | Status: AC
  Administered 2014-11-18: 10 mg via ORAL
  Filled 2014-11-18: qty 2

## 2014-11-18 MED ORDER — LORATADINE 10 MG PO TABS: 10.0000 mg | ORAL_TABLET | Freq: Every day | ORAL | Status: DC | PRN

## 2014-11-18 MED ORDER — ONDANSETRON HCL 4 MG PO TABS: 4.0000 mg | ORAL_TABLET | Freq: Three times a day (TID) | ORAL | Status: DC | PRN

## 2014-11-18 MED ORDER — HYDROMORPHONE HCL 2 MG/ML IJ SOLN
2.0000 mg | Freq: Once | INTRAMUSCULAR | Status: AC
Start: 2014-11-18 — End: 2014-11-18
  Administered 2014-11-18: 2 mg via INTRAVENOUS
  Filled 2014-11-18: qty 1
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
## 2014-11-18 NOTE — Progress Notes (Signed)
ANTICOAGULATION CONSULT NOTE - follow up  Pharmacy Consult for warfarin Indication: atrial fibrillation  Allergies  Allergen Reactions  . Ultram [Tramadol] Nausea And Vomiting  . Ibuprofen Other (See Comments)    Upset gi  . Nitroglycerin Hives  . Norvasc [Amlodipine Besylate] Swelling  . Aspirin Rash  . Lisinopril Rash    Patient Measurements: Height: 5\' 6"  (167.6 cm) Weight: 250 lb (113.399 kg) IBW/kg (Calculated) : 59.3   Vital Signs: Temp: 98.7 F (37.1 C) (02/15 0557) Temp Source: Oral (02/15 0557) BP: 152/79 mmHg (02/15 0557) Pulse Rate: 63 (02/15 0557)  Labs:  Recent Labs  11/15/14 1639 11/16/14 0555 11/16/14 0908 11/17/14 0439 11/18/14 0438  HGB 9.9* 10.6*  --   --  10.7*  HCT 31.4* 33.5*  --   --  33.3*  PLT 195 221  --   --  228  LABPROT  --  22.6*  --  21.1* 23.4*  INR  --  1.97*  --  1.80* 2.07*  CREATININE  --   --  0.95 0.88  --   TROPONINI  --  <0.03  --   --   --     Estimated Creatinine Clearance: 86.8 mL/min (by C-G formula based on Cr of 0.88).   Medications:  Scheduled:  . acetaminophen  650 mg Oral Q4H  . carvedilol  12.5 mg Oral BID WC  . diltiazem  240 mg Oral Daily  . fluticasone  2 spray Each Nare Daily  . gabapentin  300 mg Oral TID  . hydrALAZINE  25 mg Oral TID  . loratadine  10 mg Oral Daily  . ondansetron  4 mg Intravenous 4 times per day  . oxymetazoline  1 spray Each Nare BID  . pantoprazole  40 mg Oral Daily  . polyethylene glycol  17 g Oral Daily  . pravastatin  20 mg Oral Daily  . sodium chloride  3 mL Intravenous Q12H  . Warfarin - Pharmacist Dosing Inpatient   Does not apply q1800    Assessment: 59 y.o. female presented 2/10 with N/V/D, abdominal pain, hematemesis and chest discomfort.  PMH includes atrial fibrillation on warfarin, hypertension, hyperlipidemia, GERD, asthma, hepatitis, alcohol induced seizure, and stroke w/o residual deficit.  Warfarin was initially held for hematemesis.  EGD was w/o bleeding  but revealed doudenal erosion.  GI recommends restarting anticoagulation.  Pharmacy was consulted to resume dosing on 2/12.  Home regimen: 7.5mg  M,W,F,Sat and 5mg  Tue,Thur,Sun (Last dose 2/9 PTA) INR 1.9 on admission  Today, 11/18/2014 :  INR 2.07 (therapeutic) - boosted dose of 10mg  given last evening  Held doses on 2/10-2/11  CBC: Hgb 10.7, WNL  No bleeding or complications reported  Diet: Cardiac  Drug interactions: None (Levaquin given 2/12 - 2/13)  Goal of Therapy:  INR 2-3   Plan:   Warfarin 7.5mg  po x 1 @ 1800  Daily INR   For discharge, recommend resuming home regimen with INR recheck in 3-4 days.  Doreene Eland, PharmD, BCPS.   Pager: [phone] 11/18/2014 8:55 AM

## 2014-11-20 LAB — CULTURE, BLOOD (ROUTINE X 2)
Culture: NO GROWTH
Culture: NO GROWTH

## 2014-12-15 ENCOUNTER — Other Ambulatory Visit: Payer: Self-pay | Admitting: Internal Medicine

## 2015-01-24 NOTE — Discharge Summary (Signed)
PATIENT NAME:  Audrey Hill, Audrey Hill MR#:  [id number] DATE OF BIRTH:  1955/11/02  DATE OF ADMISSION:  03/20/2013 DATE OF DISCHARGE:    ADMITTING DIAGNOSIS: Unstable left total knee.   DISCHARGE DIAGNOSIS: Unstable left total knee.  OPERATION: On 03/20/2013, she had a left revision tibial polyethylene component.   SURGEON: Hessie Knows, MD.   ANESTHESIA: Spinal.   ESTIMATED BLOOD LOSS: 250 mL.  TOURNIQUET TIME: 37 minutes at 300 mmHg.  IMPLANTS: Stryker #4 PS, 19 mm.   COMPLICATIONS: None.   HISTORY: Audrey Hill is a 59 year old African American female, who underwent a left total knee and began having instability. She consented to revision of her knee with tibial polyethylene insert revision.   PHYSICAL EXAMINATION:  GENERAL: Well-developed, well-nourished. Alert and x3. No acute distress.  NEUROLOGIC: Sensation intact in the left lower extremity.  HEENT: Normocephalic, atraumatic. PERRLA. Mallampati score 2. She has upper dentures.  CARDIOVASCULAR: No edema or effusion of the left lower extremity. Left DP pulse +2.  HEART: Regular rate and rhythm. No murmurs.  RESPIRATORY: Lungs clear to auscultation. No wheezes, rhonchi or crackles. Chest expansion symmetric bilaterally.  ABDOMEN: No hepatomegaly. No splenomegaly. Soft. Nontender throughout entire exam and positive bowel sounds in all four quadrants.  EXTREMITEIS: Left knee range of motion 0 to 110. She is tender to palpation over the  medial and lateral joint lines and patellar tendon. She has slight antalgic gait from her left knee and using a cane in clinic. Hip flexion muscle strength 5/5. Skin incision consistent with previous total knee arthroplasty is well-healed without surrounding erythema or drainage.   HOSPITAL COURSE: After initial admission on 03/20/2013, she underwent a revision of her left total knee. Her postoperative day 1, 03/21/2013, her hemoglobin was 11.1 and physical therapy was begun on this day. On  postoperative day 2, 03/22/2013, she continued with physical therapy. but had significant pain. Adjustments were made to her pain medicines including the addition of Nucynta. On postoperative day 3, 03/23/2013, she began complaining of chest pain overnight. Medicine was consulted. She had a chest x-ray. EKG showed sinus tachycardia with PACs. She had urinalysis and chemistries. On postoperative day 4, she continued to complain of mild chest pain and had slow progress with physical therapy. On postoperative day 5, her chest pain was not as severe as it was the previous couple of days. She continues to have slow progress with physical therapy. On postoperative day 6, 03/26/2013, she continued to have slow progress with physical therapy. No complaints of chest pain. She is stable and ready for discharge. Per medicine note from 03/25/2013, she is stable to be and ready to be discharged to rehab.   CONDITION AT DISCHARGE: Stable.   DISPOSITION: The patient was sent to rehab.   DISCHARGE INSTRUCTIONS: The patient will follow up in [place] in 2 weeks for staple removal. She will do physical therapy and may be weight-bearing as tolerated on the left lower extremity. Her diet is regular. TED hose knee-high bilaterally. Dressing will be changed when she returns to clinic.   DISCHARGE MEDICATIONS:  1.  Pravastatin 20 mg 1 tab p.o. at bedtime.  2.  Clopidogrel 75 mg 1 tab p.o. once daily.  3.  Amlodipine 10 mg 1 tab p.o. q.a.m.  4.  Losartan 25 mg 1 tab p.o. q.a.m.  5.  Omeprazole 20 mg delayed release 1 tab p.o. q.a.m.  6.  Temazepam 22.5 mg 1 cap p.o. at bedtime.  7.  Gabapentin 300 mg 1  cap p.o. t.i.d. p.r.n. pain.  8.  Oxycodone 5 mg 1 tab p.o. q.4 hours p.r.n. pain.  9.  Tapentadol 50 mg 1 tab p.o. q.4 hours p.r.n. pain.  10. Magnesium hydroxide 8% oral suspension 30 mL p.o. b.i.d. p.r.n. constipation.  11. Enoxaparin 40 mg subcutaneous once daily x14 days.  12. Bisacodyl 10 mg  rectal suppository 1 suppository per rectum once daily p.r.n. constipation.  13. Methocarbamol 1 tab p.o. q.6 hours p.r.n. pain.   ____________________________ Charlene Cowdrey M. Tretha Sciara, NP amb:aw D: 03/26/2013 07:57:08 ET T: 03/26/2013 08:18:16 ET JOB#: [id number]  cc: Wilberth Damon M. Tretha Sciara, NP, <Dictator> Kem Kays Gabor Lusk FNP ELECTRONICALLY SIGNED 04/10/2013 13:07
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
## 2015-01-24 NOTE — Op Note (Signed)
PATIENT NAME:  Audrey Hill, Audrey Hill MR#:  [id number] DATE OF BIRTH:  March 27, 1956  DATE OF PROCEDURE:  03/20/2013  PREOPERATIVE DIAGNOSIS: Unstable left total knee.   POSTOPERATIVE DIAGNOSIS: Unstable left total knee.   PROCEDURE: Revision left knee, tibial component.   ANESTHESIA: Spinal.   SURGEON: Laurene Footman, M.D.   ASSISTANT: Rachelle Hora, PA-C.   DESCRIPTION OF PROCEDURE: The patient was brought to the operating room and after adequate anesthesia was obtained, the left leg was prepped and draped in the usual sterile fashion. After patient identification and timeout procedures were completed, the leg was exsanguinated with an Esmarch and the tourniquet raised to 300 mmHg. The prior arthrotomy was opened with a medial arthrotomy skin incision followed by deep incision and removal of prior Ethibond sutures. Inspection revealed some mild synovitis, particularly impinging into the joint, and it was apparent that there was some synovitis which was impinging in the femorotibial articulation. The proximal tibia was adequately exposed for removal of the implant. After removal of the implant, a 19 mm trial was inserted and there was excellent stability. The knee was then infiltrated with Exparel to aid in postop analgesia in the periarticular surfaces. The 19 mm PS4 Stryker insert was then placed and locked into position with full extension and flexion, excellent patellofemoral tracking and excellent stability in mid flexion and extension. The arthrotomy was then repaired using a heavy quill suture with additional Ethibond medial to the patella to help maintain alignment, 2-0 quill subcutaneously, staples, Xeroform, 4 x 4's, Webril, Ace wrap and Polar Care device, and the patient was sent to the recovery room in stable condition.   ESTIMATED BLOOD LOSS: 10 mL.   COMPLICATIONS: None.   SPECIMENS: None. Prior implant was not defective and was discarded.   CONDITION: To recovery room stable.     ____________________________ Laurene Footman, MD mjm:gb D: 03/20/2013 22:34:10 ET T: 03/20/2013 23:40:43 ET JOB#: [id number]  cc: Laurene Footman, MD, <Dictator> Laurene Footman MD ELECTRONICALLY SIGNED 03/21/2013 8:25

## 2015-05-09 ENCOUNTER — Emergency Department (HOSPITAL_BASED_OUTPATIENT_CLINIC_OR_DEPARTMENT_OTHER): Payer: Medicare Other

## 2015-05-09 ENCOUNTER — Emergency Department (HOSPITAL_BASED_OUTPATIENT_CLINIC_OR_DEPARTMENT_OTHER)
Admission: EM | Admit: 2015-05-09 | Discharge: 2015-05-10 | Disposition: A | Discharge status: Home / Self Care | Payer: Medicare Other | Attending: Emergency Medicine | Admitting: Emergency Medicine

## 2015-05-09 DIAGNOSIS — I4891 Unspecified atrial fibrillation: Secondary | ICD-10-CM | POA: Diagnosis not present

## 2015-05-09 DIAGNOSIS — Y9289 Other specified places as the place of occurrence of the external cause: Secondary | ICD-10-CM | POA: Diagnosis not present

## 2015-05-09 DIAGNOSIS — J45909 Unspecified asthma, uncomplicated: Secondary | ICD-10-CM | POA: Diagnosis not present

## 2015-05-09 DIAGNOSIS — W109XXA Fall (on) (from) unspecified stairs and steps, initial encounter: Secondary | ICD-10-CM | POA: Insufficient documentation

## 2015-05-09 DIAGNOSIS — M25521 Pain in right elbow: Secondary | ICD-10-CM

## 2015-05-09 DIAGNOSIS — E785 Hyperlipidemia, unspecified: Secondary | ICD-10-CM | POA: Insufficient documentation

## 2015-05-09 DIAGNOSIS — Z8739 Personal history of other diseases of the musculoskeletal system and connective tissue: Secondary | ICD-10-CM | POA: Insufficient documentation

## 2015-05-09 DIAGNOSIS — K219 Gastro-esophageal reflux disease without esophagitis: Secondary | ICD-10-CM | POA: Diagnosis not present

## 2015-05-09 DIAGNOSIS — S4991XA Unspecified injury of right shoulder and upper arm, initial encounter: Secondary | ICD-10-CM | POA: Insufficient documentation

## 2015-05-09 DIAGNOSIS — W19XXXA Unspecified fall, initial encounter: Secondary | ICD-10-CM

## 2015-05-09 DIAGNOSIS — Z862 Personal history of diseases of the blood and blood-forming organs and certain disorders involving the immune mechanism: Secondary | ICD-10-CM | POA: Insufficient documentation

## 2015-05-09 DIAGNOSIS — Z8619 Personal history of other infectious and parasitic diseases: Secondary | ICD-10-CM | POA: Diagnosis not present

## 2015-05-09 DIAGNOSIS — Y9301 Activity, walking, marching and hiking: Secondary | ICD-10-CM | POA: Diagnosis not present

## 2015-05-09 DIAGNOSIS — G8929 Other chronic pain: Secondary | ICD-10-CM | POA: Diagnosis not present

## 2015-05-09 DIAGNOSIS — G40909 Epilepsy, unspecified, not intractable, without status epilepticus: Secondary | ICD-10-CM | POA: Diagnosis not present

## 2015-05-09 DIAGNOSIS — I10 Essential (primary) hypertension: Secondary | ICD-10-CM | POA: Diagnosis not present

## 2015-05-09 DIAGNOSIS — Y998 Other external cause status: Secondary | ICD-10-CM | POA: Diagnosis not present

## 2015-05-09 DIAGNOSIS — M545 Low back pain, unspecified: Secondary | ICD-10-CM

## 2015-05-09 DIAGNOSIS — S3992XA Unspecified injury of lower back, initial encounter: Secondary | ICD-10-CM | POA: Diagnosis not present

## 2015-05-09 DIAGNOSIS — R011 Cardiac murmur, unspecified: Secondary | ICD-10-CM | POA: Insufficient documentation

## 2015-05-09 DIAGNOSIS — Z87891 Personal history of nicotine dependence: Secondary | ICD-10-CM | POA: Diagnosis not present

## 2015-05-09 DIAGNOSIS — Z8659 Personal history of other mental and behavioral disorders: Secondary | ICD-10-CM | POA: Insufficient documentation

## 2015-05-09 DIAGNOSIS — G43909 Migraine, unspecified, not intractable, without status migrainosus: Secondary | ICD-10-CM | POA: Diagnosis not present

## 2015-05-09 DIAGNOSIS — Z8673 Personal history of transient ischemic attack (TIA), and cerebral infarction without residual deficits: Secondary | ICD-10-CM | POA: Diagnosis not present

## 2015-05-09 DIAGNOSIS — Z79899 Other long term (current) drug therapy: Secondary | ICD-10-CM | POA: Insufficient documentation

## 2015-05-09 DIAGNOSIS — S59901A Unspecified injury of right elbow, initial encounter: Secondary | ICD-10-CM | POA: Insufficient documentation

## 2015-05-09 DIAGNOSIS — Z7901 Long term (current) use of anticoagulants: Secondary | ICD-10-CM | POA: Insufficient documentation

## 2015-05-09 MED ORDER — HYDROMORPHONE HCL 1 MG/ML IJ SOLN
1.0000 mg | Freq: Once | INTRAMUSCULAR | Status: AC
  Administered 2015-05-09: 1 mg via INTRAMUSCULAR
  Filled 2015-05-09: qty 1

## 2015-05-09 NOTE — ED Notes (Signed)
Pt reports fall to concrete surface causing pain to right arm and elbow reports Hx of chronic back pain and felt back give out causing fall today
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
## 2015-05-09 NOTE — ED Notes (Signed)
Pt states back gave out causing her to fall,  C/o rt arm, shoulder and elbow pain  No deformity noted

## 2015-05-10 DIAGNOSIS — S3992XA Unspecified injury of lower back, initial encounter: Secondary | ICD-10-CM | POA: Diagnosis not present

## 2015-05-10 MED ORDER — HYDROCODONE-ACETAMINOPHEN 5-325 MG PO TABS
1.0000 | ORAL_TABLET | Freq: Once | ORAL | Status: AC
  Administered 2015-05-10: 1 via ORAL
  Filled 2015-05-10: qty 1

## 2015-05-10 NOTE — Discharge Instructions (Signed)

## 2015-06-18 ENCOUNTER — Encounter (HOSPITAL_BASED_OUTPATIENT_CLINIC_OR_DEPARTMENT_OTHER): Payer: Self-pay | Admitting: Emergency Medicine

## 2015-06-18 ENCOUNTER — Emergency Department (HOSPITAL_BASED_OUTPATIENT_CLINIC_OR_DEPARTMENT_OTHER)
Admission: EM | Admit: 2015-06-18 | Discharge: 2015-06-18 | Disposition: A | Discharge status: Home / Self Care | Payer: Medicare Other | Attending: Emergency Medicine | Admitting: Emergency Medicine

## 2015-06-18 ENCOUNTER — Emergency Department (HOSPITAL_BASED_OUTPATIENT_CLINIC_OR_DEPARTMENT_OTHER): Payer: Medicare Other

## 2015-06-18 DIAGNOSIS — Z8619 Personal history of other infectious and parasitic diseases: Secondary | ICD-10-CM | POA: Insufficient documentation

## 2015-06-18 DIAGNOSIS — R0789 Other chest pain: Secondary | ICD-10-CM | POA: Insufficient documentation

## 2015-06-18 DIAGNOSIS — M1990 Unspecified osteoarthritis, unspecified site: Secondary | ICD-10-CM | POA: Insufficient documentation

## 2015-06-18 DIAGNOSIS — G43909 Migraine, unspecified, not intractable, without status migrainosus: Secondary | ICD-10-CM | POA: Insufficient documentation

## 2015-06-18 DIAGNOSIS — Z79899 Other long term (current) drug therapy: Secondary | ICD-10-CM | POA: Insufficient documentation

## 2015-06-18 DIAGNOSIS — I4891 Unspecified atrial fibrillation: Secondary | ICD-10-CM | POA: Diagnosis not present

## 2015-06-18 DIAGNOSIS — Z7901 Long term (current) use of anticoagulants: Secondary | ICD-10-CM | POA: Diagnosis not present

## 2015-06-18 DIAGNOSIS — Z8669 Personal history of other diseases of the nervous system and sense organs: Secondary | ICD-10-CM | POA: Diagnosis not present

## 2015-06-18 DIAGNOSIS — G40909 Epilepsy, unspecified, not intractable, without status epilepticus: Secondary | ICD-10-CM | POA: Insufficient documentation

## 2015-06-18 DIAGNOSIS — R011 Cardiac murmur, unspecified: Secondary | ICD-10-CM | POA: Diagnosis not present

## 2015-06-18 DIAGNOSIS — Z8673 Personal history of transient ischemic attack (TIA), and cerebral infarction without residual deficits: Secondary | ICD-10-CM | POA: Diagnosis not present

## 2015-06-18 DIAGNOSIS — F419 Anxiety disorder, unspecified: Secondary | ICD-10-CM | POA: Diagnosis not present

## 2015-06-18 DIAGNOSIS — Z87828 Personal history of other (healed) physical injury and trauma: Secondary | ICD-10-CM | POA: Insufficient documentation

## 2015-06-18 DIAGNOSIS — R079 Chest pain, unspecified: Secondary | ICD-10-CM | POA: Diagnosis present

## 2015-06-18 DIAGNOSIS — I209 Angina pectoris, unspecified: Secondary | ICD-10-CM | POA: Insufficient documentation

## 2015-06-18 DIAGNOSIS — E785 Hyperlipidemia, unspecified: Secondary | ICD-10-CM | POA: Diagnosis not present

## 2015-06-18 DIAGNOSIS — G8929 Other chronic pain: Secondary | ICD-10-CM | POA: Diagnosis not present

## 2015-06-18 DIAGNOSIS — R1013 Epigastric pain: Secondary | ICD-10-CM | POA: Insufficient documentation

## 2015-06-18 DIAGNOSIS — M5412 Radiculopathy, cervical region: Secondary | ICD-10-CM

## 2015-06-18 DIAGNOSIS — Z87891 Personal history of nicotine dependence: Secondary | ICD-10-CM | POA: Diagnosis not present

## 2015-06-18 DIAGNOSIS — I10 Essential (primary) hypertension: Secondary | ICD-10-CM | POA: Insufficient documentation

## 2015-06-18 DIAGNOSIS — K219 Gastro-esophageal reflux disease without esophagitis: Secondary | ICD-10-CM | POA: Diagnosis not present

## 2015-06-18 DIAGNOSIS — J45909 Unspecified asthma, uncomplicated: Secondary | ICD-10-CM | POA: Diagnosis not present

## 2015-06-18 LAB — BASIC METABOLIC PANEL
ANION GAP: 5 (ref 5–15)
BUN: 18 mg/dL (ref 6–20)
CHLORIDE: 109 mmol/L (ref 101–111)
CO2: 28 mmol/L (ref 22–32)
Calcium: 8.6 mg/dL — ABNORMAL LOW (ref 8.9–10.3)
Creatinine, Ser: 1.42 mg/dL — ABNORMAL HIGH (ref 0.44–1.00)
GFR calc non Af Amer: 39 mL/min — ABNORMAL LOW (ref >60)
GFR, EST AFRICAN AMERICAN: 45 mL/min — AB (ref >60)
GLUCOSE: 101 mg/dL — AB (ref 65–99)
POTASSIUM: 3.6 mmol/L (ref 3.5–5.1)
Sodium: 142 mmol/L (ref 135–145)

## 2015-06-18 LAB — TROPONIN I: Troponin I: <0.03 ng/mL (ref <0.031)

## 2015-06-18 LAB — CBC
HEMATOCRIT: 34.9 % — AB (ref 36.0–46.0)
HEMOGLOBIN: 11.5 g/dL — AB (ref 12.0–15.0)
MCH: 26.6 pg (ref 26.0–34.0)
MCHC: 33 g/dL (ref 30.0–36.0)
MCV: 80.8 fL (ref 78.0–100.0)
Platelets: 237 10*3/uL (ref 150–400)
RBC: 4.32 MIL/uL (ref 3.87–5.11)
RDW: 14.8 % (ref 11.5–15.5)
WBC: 5.4 10*3/uL (ref 4.0–10.5)

## 2015-06-18 LAB — DIFFERENTIAL
Basophils Absolute: 0 10*3/uL (ref 0.0–0.1)
Basophils Relative: 0 %
Eosinophils Absolute: 0.1 10*3/uL (ref 0.0–0.7)
Eosinophils Relative: 2 %
LYMPHS ABS: 1.8 10*3/uL (ref 0.7–4.0)
LYMPHS PCT: 34 %
MONO ABS: 0.5 10*3/uL (ref 0.1–1.0)
Monocytes Relative: 8 %
NEUTROS ABS: 3 10*3/uL (ref 1.7–7.7)
Neutrophils Relative %: 56 %

## 2015-06-18 LAB — BRAIN NATRIURETIC PEPTIDE: B NATRIURETIC PEPTIDE 5: 13.9 pg/mL (ref 0.0–100.0)

## 2015-06-18 LAB — URINALYSIS, ROUTINE W REFLEX MICROSCOPIC
Bilirubin Urine: NEGATIVE
Glucose, UA: NEGATIVE mg/dL
Hgb urine dipstick: NEGATIVE
KETONES UR: NEGATIVE mg/dL
LEUKOCYTES UA: NEGATIVE
NITRITE: NEGATIVE
PH: 5.5 (ref 5.0–8.0)
PROTEIN: NEGATIVE mg/dL
Specific Gravity, Urine: 1.015 (ref 1.005–1.030)
Urobilinogen, UA: 0.2 mg/dL (ref 0.0–1.0)

## 2015-06-18 MED ORDER — ONDANSETRON HCL 8 MG PO TABS: 8.0000 mg | ORAL_TABLET | Freq: Three times a day (TID) | ORAL | Status: DC | PRN

## 2015-06-18 MED ORDER — ONDANSETRON HCL 4 MG/2ML IJ SOLN
4.0000 mg | Freq: Once | INTRAMUSCULAR | Status: AC
  Administered 2015-06-18: 4 mg via INTRAVENOUS
  Filled 2015-06-18: qty 2

## 2015-06-18 MED ORDER — FENTANYL CITRATE (PF) 100 MCG/2ML IJ SOLN
100.0000 ug | Freq: Once | INTRAMUSCULAR | Status: AC
  Administered 2015-06-18: 100 ug via INTRAVENOUS
  Filled 2015-06-18: qty 2

## 2015-06-18 MED ORDER — LORAZEPAM 1 MG PO TABS
1.0000 mg | ORAL_TABLET | Freq: Once | ORAL | Status: AC
  Administered 2015-06-18: 1 mg via ORAL
  Filled 2015-06-18: qty 1

## 2015-06-18 MED ORDER — OXYCODONE-ACETAMINOPHEN 5-325 MG PO TABS: 1.0000 | ORAL_TABLET | Freq: Four times a day (QID) | ORAL | Status: DC | PRN

## 2015-06-18 NOTE — ED Notes (Signed)
Patient states that she woke up about an hour ago with a HA and chest pain to her generalized chest. Patient reports that she has had right arm numbness intermittently x weeks. Patient reports that she is SOB, and dizzy and lightheaded upon waking up with the headache.
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
## 2015-07-30 ENCOUNTER — Emergency Department (HOSPITAL_BASED_OUTPATIENT_CLINIC_OR_DEPARTMENT_OTHER): Payer: Medicare Other

## 2015-07-30 ENCOUNTER — Emergency Department (HOSPITAL_BASED_OUTPATIENT_CLINIC_OR_DEPARTMENT_OTHER)
Admission: EM | Admit: 2015-07-30 | Discharge: 2015-07-30 | Disposition: A | Discharge status: Home / Self Care | Payer: Medicare Other | Attending: Emergency Medicine | Admitting: Emergency Medicine

## 2015-07-30 ENCOUNTER — Encounter (HOSPITAL_BASED_OUTPATIENT_CLINIC_OR_DEPARTMENT_OTHER): Payer: Self-pay

## 2015-07-30 DIAGNOSIS — Z79899 Other long term (current) drug therapy: Secondary | ICD-10-CM | POA: Diagnosis not present

## 2015-07-30 DIAGNOSIS — E785 Hyperlipidemia, unspecified: Secondary | ICD-10-CM | POA: Insufficient documentation

## 2015-07-30 DIAGNOSIS — R1010 Upper abdominal pain, unspecified: Secondary | ICD-10-CM | POA: Diagnosis present

## 2015-07-30 DIAGNOSIS — Z7901 Long term (current) use of anticoagulants: Secondary | ICD-10-CM | POA: Diagnosis not present

## 2015-07-30 DIAGNOSIS — G40909 Epilepsy, unspecified, not intractable, without status epilepticus: Secondary | ICD-10-CM | POA: Insufficient documentation

## 2015-07-30 DIAGNOSIS — G43909 Migraine, unspecified, not intractable, without status migrainosus: Secondary | ICD-10-CM | POA: Insufficient documentation

## 2015-07-30 DIAGNOSIS — M1990 Unspecified osteoarthritis, unspecified site: Secondary | ICD-10-CM | POA: Diagnosis not present

## 2015-07-30 DIAGNOSIS — R1013 Epigastric pain: Secondary | ICD-10-CM | POA: Insufficient documentation

## 2015-07-30 DIAGNOSIS — I4891 Unspecified atrial fibrillation: Secondary | ICD-10-CM | POA: Insufficient documentation

## 2015-07-30 DIAGNOSIS — Z862 Personal history of diseases of the blood and blood-forming organs and certain disorders involving the immune mechanism: Secondary | ICD-10-CM | POA: Diagnosis not present

## 2015-07-30 DIAGNOSIS — J45909 Unspecified asthma, uncomplicated: Secondary | ICD-10-CM | POA: Insufficient documentation

## 2015-07-30 DIAGNOSIS — Z8619 Personal history of other infectious and parasitic diseases: Secondary | ICD-10-CM | POA: Insufficient documentation

## 2015-07-30 DIAGNOSIS — R630 Anorexia: Secondary | ICD-10-CM | POA: Diagnosis not present

## 2015-07-30 DIAGNOSIS — Z9889 Other specified postprocedural states: Secondary | ICD-10-CM | POA: Insufficient documentation

## 2015-07-30 DIAGNOSIS — K219 Gastro-esophageal reflux disease without esophagitis: Secondary | ICD-10-CM | POA: Insufficient documentation

## 2015-07-30 DIAGNOSIS — R112 Nausea with vomiting, unspecified: Secondary | ICD-10-CM | POA: Diagnosis not present

## 2015-07-30 DIAGNOSIS — Z8673 Personal history of transient ischemic attack (TIA), and cerebral infarction without residual deficits: Secondary | ICD-10-CM | POA: Insufficient documentation

## 2015-07-30 DIAGNOSIS — Z87828 Personal history of other (healed) physical injury and trauma: Secondary | ICD-10-CM | POA: Insufficient documentation

## 2015-07-30 DIAGNOSIS — Z87891 Personal history of nicotine dependence: Secondary | ICD-10-CM | POA: Diagnosis not present

## 2015-07-30 DIAGNOSIS — I209 Angina pectoris, unspecified: Secondary | ICD-10-CM | POA: Insufficient documentation

## 2015-07-30 DIAGNOSIS — I10 Essential (primary) hypertension: Secondary | ICD-10-CM | POA: Insufficient documentation

## 2015-07-30 DIAGNOSIS — R011 Cardiac murmur, unspecified: Secondary | ICD-10-CM | POA: Insufficient documentation

## 2015-07-30 DIAGNOSIS — R059 Cough, unspecified: Secondary | ICD-10-CM

## 2015-07-30 DIAGNOSIS — G8929 Other chronic pain: Secondary | ICD-10-CM | POA: Insufficient documentation

## 2015-07-30 DIAGNOSIS — R05 Cough: Secondary | ICD-10-CM

## 2015-07-30 DIAGNOSIS — F419 Anxiety disorder, unspecified: Secondary | ICD-10-CM | POA: Insufficient documentation

## 2015-07-30 LAB — CBC WITH DIFFERENTIAL/PLATELET
Basophils Absolute: 0 10*3/uL (ref 0.0–0.1)
Basophils Relative: 0 %
EOS ABS: 0.1 10*3/uL (ref 0.0–0.7)
EOS PCT: 2 %
HCT: 35.8 % — ABNORMAL LOW (ref 36.0–46.0)
Hemoglobin: 11.6 g/dL — ABNORMAL LOW (ref 12.0–15.0)
LYMPHS ABS: 1.5 10*3/uL (ref 0.7–4.0)
Lymphocytes Relative: 30 %
MCH: 25.8 pg — AB (ref 26.0–34.0)
MCHC: 32.4 g/dL (ref 30.0–36.0)
MCV: 79.6 fL (ref 78.0–100.0)
MONO ABS: 0.4 10*3/uL (ref 0.1–1.0)
MONOS PCT: 8 %
NEUTROS ABS: 2.9 10*3/uL (ref 1.7–7.7)
NEUTROS PCT: 60 %
PLATELETS: 239 10*3/uL (ref 150–400)
RBC: 4.5 MIL/uL (ref 3.87–5.11)
RDW: 14.4 % (ref 11.5–15.5)
WBC: 4.8 10*3/uL (ref 4.0–10.5)

## 2015-07-30 LAB — COMPREHENSIVE METABOLIC PANEL
ALT: 14 U/L (ref 14–54)
ANION GAP: 2 — AB (ref 5–15)
AST: 23 U/L (ref 15–41)
Albumin: 3.8 g/dL (ref 3.5–5.0)
Alkaline Phosphatase: 52 U/L (ref 38–126)
BUN: 9 mg/dL (ref 6–20)
CHLORIDE: 112 mmol/L — AB (ref 101–111)
CO2: 27 mmol/L (ref 22–32)
CREATININE: 0.9 mg/dL (ref 0.44–1.00)
Calcium: 8.5 mg/dL — ABNORMAL LOW (ref 8.9–10.3)
Glucose, Bld: 95 mg/dL (ref 65–99)
Potassium: 3.7 mmol/L (ref 3.5–5.1)
SODIUM: 141 mmol/L (ref 135–145)
TOTAL PROTEIN: 7.2 g/dL (ref 6.5–8.1)
Total Bilirubin: 0.6 mg/dL (ref 0.3–1.2)

## 2015-07-30 LAB — LIPASE, BLOOD: Lipase: 24 U/L (ref 11–51)

## 2015-07-30 LAB — URINALYSIS, ROUTINE W REFLEX MICROSCOPIC
BILIRUBIN URINE: NEGATIVE
GLUCOSE, UA: NEGATIVE mg/dL
Hgb urine dipstick: NEGATIVE
KETONES UR: NEGATIVE mg/dL
LEUKOCYTES UA: NEGATIVE
NITRITE: NEGATIVE
PH: 7.5 (ref 5.0–8.0)
Protein, ur: NEGATIVE mg/dL
SPECIFIC GRAVITY, URINE: 1.011 (ref 1.005–1.030)
Urobilinogen, UA: 0.2 mg/dL (ref 0.0–1.0)

## 2015-07-30 MED ORDER — ONDANSETRON HCL 4 MG/2ML IJ SOLN
4.0000 mg | Freq: Once | INTRAMUSCULAR | Status: AC
  Administered 2015-07-30: 4 mg via INTRAVENOUS
  Filled 2015-07-30: qty 2

## 2015-07-30 MED ORDER — GI COCKTAIL ~~LOC~~
30.0000 mL | Freq: Once | ORAL | Status: AC
  Administered 2015-07-30: 30 mL via ORAL
  Filled 2015-07-30: qty 30

## 2015-07-30 MED ORDER — ALBUTEROL SULFATE HFA 108 (90 BASE) MCG/ACT IN AERS
2.0000 | INHALATION_SPRAY | RESPIRATORY_TRACT | Status: DC | PRN
  Administered 2015-07-30: 2 via RESPIRATORY_TRACT
  Filled 2015-07-30: qty 6.7

## 2015-07-30 MED ORDER — IPRATROPIUM BROMIDE 0.02 % IN SOLN
0.5000 mg | Freq: Once | RESPIRATORY_TRACT | Status: AC
  Administered 2015-07-30: 0.5 mg via RESPIRATORY_TRACT
  Filled 2015-07-30: qty 2.5

## 2015-07-30 MED ORDER — MORPHINE SULFATE (PF) 4 MG/ML IV SOLN
6.0000 mg | Freq: Once | INTRAVENOUS | Status: AC
  Administered 2015-07-30: 6 mg via INTRAVENOUS
  Filled 2015-07-30: qty 2

## 2015-07-30 MED ORDER — OMEPRAZOLE 20 MG PO CPDR: DELAYED_RELEASE_CAPSULE | ORAL | Status: DC

## 2015-07-30 MED ORDER — ALBUTEROL SULFATE (2.5 MG/3ML) 0.083% IN NEBU
5.0000 mg | INHALATION_SOLUTION | Freq: Once | RESPIRATORY_TRACT | Status: AC
  Administered 2015-07-30: 5 mg via RESPIRATORY_TRACT
  Filled 2015-07-30: qty 6

## 2015-07-30 NOTE — ED Notes (Signed)
Pt trying to establish ride before given pain meds

## 2015-07-30 NOTE — ED Notes (Signed)
Pa  at bedside. 

## 2015-07-30 NOTE — ED Notes (Signed)
Patient transported to X-ray 
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
## 2015-07-30 NOTE — Discharge Instructions (Signed)
Please read and follow all provided instructions.  Your diagnoses today include:  1. Epigastric abdominal pain   2. Cough     Tests performed today include:  Blood counts and electrolytes  Blood tests to check liver and kidney function  Blood tests to check pancreas function  Urine test to look for infection  X-ray of your chest and abdomen - no signs of a blockage or pneumonia  Vital signs. See below for your results today.   Medications prescribed:   Omeprazole (Prilosec) - stomach acid reducer  This medication can be found over-the-counter   Albuterol inhaler - medication that opens up your airway  Use inhaler as follows: 1-2 puffs with spacer every 4 hours as needed for wheezing, cough, or shortness of breath.   Take any prescribed medications only as directed.  Home care instructions:   Follow any educational materials contained in this packet.  Follow-up instructions: Please follow-up with your primary care provider in the next 3 days for further evaluation of your symptoms.    Return instructions:  SEEK IMMEDIATE MEDICAL ATTENTION IF:  The pain does not go away or becomes severe   A temperature above 101F develops   Repeated vomiting occurs (multiple episodes)   The pain becomes localized to portions of the abdomen. The right side could possibly be appendicitis. In an adult, the left lower portion of the abdomen could be colitis or diverticulitis.   Blood is being passed in stools or vomit (bright red or black tarry stools)   You develop chest pain, difficulty breathing, dizziness or fainting, or become confused, poorly responsive, or inconsolable (young children)  If you have any other emergent concerns regarding your health  Additional Information: Abdominal (belly) pain can be caused by many things. Your caregiver performed an examination and possibly ordered blood/urine tests and imaging (CT scan, x-rays, ultrasound). Many cases can be observed  and treated at home after initial evaluation in the emergency department. Even though you are being discharged home, abdominal pain can be unpredictable. Therefore, you need a repeated exam if your pain does not resolve, returns, or worsens. Most patients with abdominal pain don't have to be admitted to the hospital or have surgery, but serious problems like appendicitis and gallbladder attacks can start out as nonspecific pain. Many abdominal conditions cannot be diagnosed in one visit, so follow-up evaluations are very important.  Your vital signs today were: BP 195/123 mmHg   Pulse 78   Temp(Src) 98.5 F (36.9 C) (Oral)   Resp 18   Ht 5\' 6"  (1.676 m)   Wt 250 lb (113.399 kg)   BMI 40.37 kg/m2   SpO2 98% If your blood pressure (bp) was elevated above 135/85 this visit, please have this repeated by your doctor within one month. --------------

## 2015-07-30 NOTE — ED Notes (Signed)
Family at bedside. 

## 2015-07-30 NOTE — ED Notes (Signed)
C/o abd pain x "couple days"-vomited x 1 diarrhea x 2-NAD

## 2015-08-17 ENCOUNTER — Emergency Department (HOSPITAL_BASED_OUTPATIENT_CLINIC_OR_DEPARTMENT_OTHER)
Admission: EM | Admit: 2015-08-17 | Discharge: 2015-08-17 | Disposition: A | Discharge status: Home / Self Care | Payer: Medicare Other | Attending: Emergency Medicine | Admitting: Emergency Medicine

## 2015-08-17 ENCOUNTER — Emergency Department (HOSPITAL_BASED_OUTPATIENT_CLINIC_OR_DEPARTMENT_OTHER): Payer: Medicare Other

## 2015-08-17 ENCOUNTER — Encounter (HOSPITAL_BASED_OUTPATIENT_CLINIC_OR_DEPARTMENT_OTHER): Payer: Self-pay | Admitting: Emergency Medicine

## 2015-08-17 DIAGNOSIS — Z862 Personal history of diseases of the blood and blood-forming organs and certain disorders involving the immune mechanism: Secondary | ICD-10-CM | POA: Insufficient documentation

## 2015-08-17 DIAGNOSIS — R011 Cardiac murmur, unspecified: Secondary | ICD-10-CM | POA: Insufficient documentation

## 2015-08-17 DIAGNOSIS — F419 Anxiety disorder, unspecified: Secondary | ICD-10-CM | POA: Insufficient documentation

## 2015-08-17 DIAGNOSIS — E785 Hyperlipidemia, unspecified: Secondary | ICD-10-CM | POA: Insufficient documentation

## 2015-08-17 DIAGNOSIS — R51 Headache: Secondary | ICD-10-CM | POA: Diagnosis present

## 2015-08-17 DIAGNOSIS — Z8739 Personal history of other diseases of the musculoskeletal system and connective tissue: Secondary | ICD-10-CM | POA: Insufficient documentation

## 2015-08-17 DIAGNOSIS — J45909 Unspecified asthma, uncomplicated: Secondary | ICD-10-CM | POA: Diagnosis not present

## 2015-08-17 DIAGNOSIS — Z9889 Other specified postprocedural states: Secondary | ICD-10-CM | POA: Diagnosis not present

## 2015-08-17 DIAGNOSIS — Z79899 Other long term (current) drug therapy: Secondary | ICD-10-CM | POA: Diagnosis not present

## 2015-08-17 DIAGNOSIS — R519 Headache, unspecified: Secondary | ICD-10-CM

## 2015-08-17 DIAGNOSIS — M1990 Unspecified osteoarthritis, unspecified site: Secondary | ICD-10-CM | POA: Diagnosis not present

## 2015-08-17 DIAGNOSIS — G40909 Epilepsy, unspecified, not intractable, without status epilepticus: Secondary | ICD-10-CM | POA: Diagnosis not present

## 2015-08-17 DIAGNOSIS — G43909 Migraine, unspecified, not intractable, without status migrainosus: Secondary | ICD-10-CM | POA: Insufficient documentation

## 2015-08-17 DIAGNOSIS — Z85828 Personal history of other malignant neoplasm of skin: Secondary | ICD-10-CM | POA: Diagnosis not present

## 2015-08-17 DIAGNOSIS — G8929 Other chronic pain: Secondary | ICD-10-CM | POA: Insufficient documentation

## 2015-08-17 DIAGNOSIS — Z8673 Personal history of transient ischemic attack (TIA), and cerebral infarction without residual deficits: Secondary | ICD-10-CM | POA: Diagnosis not present

## 2015-08-17 DIAGNOSIS — Z87891 Personal history of nicotine dependence: Secondary | ICD-10-CM | POA: Insufficient documentation

## 2015-08-17 DIAGNOSIS — Z7901 Long term (current) use of anticoagulants: Secondary | ICD-10-CM | POA: Insufficient documentation

## 2015-08-17 DIAGNOSIS — K219 Gastro-esophageal reflux disease without esophagitis: Secondary | ICD-10-CM | POA: Diagnosis not present

## 2015-08-17 DIAGNOSIS — I10 Essential (primary) hypertension: Secondary | ICD-10-CM | POA: Insufficient documentation

## 2015-08-17 DIAGNOSIS — I209 Angina pectoris, unspecified: Secondary | ICD-10-CM | POA: Insufficient documentation

## 2015-08-17 MED ORDER — SODIUM CHLORIDE 0.9 % IV BOLUS (SEPSIS)
1000.0000 mL | Freq: Once | INTRAVENOUS | Status: AC
  Administered 2015-08-17: 1000 mL via INTRAVENOUS

## 2015-08-17 MED ORDER — KETOROLAC TROMETHAMINE 30 MG/ML IJ SOLN
30.0000 mg | Freq: Once | INTRAMUSCULAR | Status: AC
  Administered 2015-08-17: 30 mg via INTRAVENOUS
  Filled 2015-08-17: qty 1

## 2015-08-17 MED ORDER — DIPHENHYDRAMINE HCL 50 MG/ML IJ SOLN
25.0000 mg | Freq: Once | INTRAMUSCULAR | Status: AC
  Administered 2015-08-17: 25 mg via INTRAVENOUS
  Filled 2015-08-17: qty 1

## 2015-08-17 MED ORDER — PROCHLORPERAZINE EDISYLATE 5 MG/ML IJ SOLN
10.0000 mg | Freq: Once | INTRAMUSCULAR | Status: AC
  Administered 2015-08-17: 10 mg via INTRAVENOUS
  Filled 2015-08-17: qty 2

## 2015-08-17 NOTE — Discharge Instructions (Signed)
°  Take 4 over the counter ibuprofen tablets 3 times a day or 2 over-the-counter naproxen tablets twice a day for pain. Do not take these medicines if you do not have a headache.   General Headache Without Cause A headache is pain or discomfort felt around the head or neck area. There are many causes and types of headaches. In some cases, the cause may not be found.  HOME CARE  Managing Pain  Take over-the-counter and prescription medicines only as told by your doctor.  Lie down in a dark, quiet room when you have a headache.  If directed, apply ice to the head and neck area:  Put ice in a plastic bag.  Place a towel between your skin and the bag.  Leave the ice on for 20 minutes, 2-3 times per day.  Use a heating pad or hot shower to apply heat to the head and neck area as told by your doctor.  Keep lights dim if bright lights bother you or make your headaches worse. Eating and Drinking  Eat meals on a regular schedule.  Lessen how much alcohol you drink.  Lessen how much caffeine you drink, or stop drinking caffeine. General Instructions  Keep all follow-up visits as told by your doctor. This is important.  Keep a journal to find out if certain things bring on headaches. For example, write down:  What you eat and drink.  How much sleep you get.  Any change to your diet or medicines.  Relax by getting a massage or doing other relaxing activities.  Lessen stress.  Sit up straight. Do not tighten (tense) your muscles.  Do not use tobacco products. This includes cigarettes, chewing tobacco, or e-cigarettes. If you need help quitting, ask your doctor.  Exercise regularly as told by your doctor.  Get enough sleep. This often means 7-9 hours of sleep. GET HELP IF:  Your symptoms are not helped by medicine.  You have a headache that feels different than the other headaches.  You feel sick to your stomach (nauseous) or you throw up (vomit).  You have a fever. GET  HELP RIGHT AWAY IF:   Your headache becomes really bad.  You keep throwing up.  You have a stiff neck.  You have trouble seeing.  You have trouble speaking.  You have pain in the eye or ear.  Your muscles are weak or you lose muscle control.  You lose your balance or have trouble walking.  You feel like you will pass out (faint) or you pass out.  You have confusion.   This information is not intended to replace advice given to you by your health care provider. Make sure you discuss any questions you have with your health care provider.   Document Released: 06/29/2008 Document Revised: 06/11/2015 Document Reviewed: 01/13/2015 Elsevier Interactive Patient Education [employer].

## 2015-08-17 NOTE — ED Notes (Signed)
Pt in c/o migraine onset 20-30 minutes ago. Negative FAST exam. Neuro intact.
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
## 2015-08-25 ENCOUNTER — Encounter: Payer: Self-pay | Admitting: Neurology

## 2015-08-25 ENCOUNTER — Ambulatory Visit (INDEPENDENT_AMBULATORY_CARE_PROVIDER_SITE_OTHER): Payer: Medicare Other | Admitting: Neurology

## 2015-08-25 VITALS — BP 172/90 | HR 78 | Ht 66.0 in | Wt 257.0 lb

## 2015-08-25 DIAGNOSIS — I10 Essential (primary) hypertension: Secondary | ICD-10-CM | POA: Diagnosis not present

## 2015-08-25 DIAGNOSIS — R51 Headache: Secondary | ICD-10-CM

## 2015-08-25 DIAGNOSIS — E785 Hyperlipidemia, unspecified: Secondary | ICD-10-CM

## 2015-08-25 DIAGNOSIS — G43019 Migraine without aura, intractable, without status migrainosus: Secondary | ICD-10-CM | POA: Diagnosis not present

## 2015-08-25 DIAGNOSIS — Z8673 Personal history of transient ischemic attack (TIA), and cerebral infarction without residual deficits: Secondary | ICD-10-CM | POA: Diagnosis not present

## 2015-08-25 DIAGNOSIS — R519 Headache, unspecified: Secondary | ICD-10-CM

## 2015-08-25 MED ORDER — PROPRANOLOL HCL ER 80 MG PO CP24: 80.0000 mg | ORAL_CAPSULE | Freq: Every day | ORAL | Status: DC
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
## 2015-08-25 NOTE — Patient Instructions (Signed)
1.  For headache, start Inderal LA (propranolol ER) 80mg  daily.  Monitor for lightheadedness.  Call in 4 weeks with update and we can increase dose if needed. 2.  At earliest onset of headache, take ibuprofen, naproxen or Excedrin. 3.  Limit use of pain relievers to no more than 2 days out of the week 4.  Will check lipid panel and Sed Rate 5.  Will check MRI and MRA of head 6.  Follow up with Dr. Kennon Holter for blood pressure 7.  Keep headache diary 8.  Follow up in 3 months.

## 2015-08-25 NOTE — Progress Notes (Signed)
Chart forwarded.  

## 2015-09-10 ENCOUNTER — Telehealth: Payer: Self-pay

## 2015-09-10 NOTE — Telephone Encounter (Signed)
Spoke with patient. Patient verbalized understanding. Asking about MRIs. Contacted [facility] for MRI. Scheduled. They will contact pt to let her know/ask screening questions.

## 2015-09-10 NOTE — Telephone Encounter (Signed)
-----   Message from Pieter Partridge, DO sent at 09/10/2015 11:29 AM EST ----- LDL (bad cholesterol) is 105.  I would like this less than 70.  I recommend increasing pravastatin from 20mg  to 40mg  daily

## 2015-09-13 ENCOUNTER — Ambulatory Visit (HOSPITAL_BASED_OUTPATIENT_CLINIC_OR_DEPARTMENT_OTHER): Payer: Medicare Other | Attending: Neurology

## 2015-09-13 ENCOUNTER — Ambulatory Visit (HOSPITAL_BASED_OUTPATIENT_CLINIC_OR_DEPARTMENT_OTHER): Payer: Medicare Other

## 2015-10-03 ENCOUNTER — Encounter: Payer: Self-pay | Admitting: Neurology

## 2015-11-03 ENCOUNTER — Encounter (HOSPITAL_COMMUNITY): Payer: Self-pay | Admitting: Emergency Medicine

## 2015-11-03 ENCOUNTER — Emergency Department (HOSPITAL_COMMUNITY): Payer: Medicare Other

## 2015-11-03 ENCOUNTER — Emergency Department (HOSPITAL_COMMUNITY)
Admission: EM | Admit: 2015-11-03 | Discharge: 2015-11-03 | Disposition: A | Discharge status: Home / Self Care | Payer: Medicare Other | Attending: Emergency Medicine | Admitting: Emergency Medicine

## 2015-11-03 DIAGNOSIS — I209 Angina pectoris, unspecified: Secondary | ICD-10-CM | POA: Diagnosis not present

## 2015-11-03 DIAGNOSIS — R011 Cardiac murmur, unspecified: Secondary | ICD-10-CM | POA: Insufficient documentation

## 2015-11-03 DIAGNOSIS — J45909 Unspecified asthma, uncomplicated: Secondary | ICD-10-CM | POA: Insufficient documentation

## 2015-11-03 DIAGNOSIS — B349 Viral infection, unspecified: Secondary | ICD-10-CM | POA: Insufficient documentation

## 2015-11-03 DIAGNOSIS — Z79899 Other long term (current) drug therapy: Secondary | ICD-10-CM | POA: Diagnosis not present

## 2015-11-03 DIAGNOSIS — Z87891 Personal history of nicotine dependence: Secondary | ICD-10-CM | POA: Diagnosis not present

## 2015-11-03 DIAGNOSIS — G8929 Other chronic pain: Secondary | ICD-10-CM | POA: Insufficient documentation

## 2015-11-03 DIAGNOSIS — K219 Gastro-esophageal reflux disease without esophagitis: Secondary | ICD-10-CM | POA: Diagnosis not present

## 2015-11-03 DIAGNOSIS — R079 Chest pain, unspecified: Secondary | ICD-10-CM | POA: Diagnosis present

## 2015-11-03 DIAGNOSIS — E785 Hyperlipidemia, unspecified: Secondary | ICD-10-CM | POA: Insufficient documentation

## 2015-11-03 DIAGNOSIS — F419 Anxiety disorder, unspecified: Secondary | ICD-10-CM | POA: Insufficient documentation

## 2015-11-03 DIAGNOSIS — Z8619 Personal history of other infectious and parasitic diseases: Secondary | ICD-10-CM | POA: Insufficient documentation

## 2015-11-03 DIAGNOSIS — I10 Essential (primary) hypertension: Secondary | ICD-10-CM | POA: Diagnosis not present

## 2015-11-03 DIAGNOSIS — Z8673 Personal history of transient ischemic attack (TIA), and cerebral infarction without residual deficits: Secondary | ICD-10-CM | POA: Insufficient documentation

## 2015-11-03 LAB — CBC WITH DIFFERENTIAL/PLATELET
Basophils Absolute: 0 10*3/uL (ref 0.0–0.1)
Basophils Relative: 1 %
EOS ABS: 0.1 10*3/uL (ref 0.0–0.7)
EOS PCT: 2 %
HCT: 37.1 % (ref 36.0–46.0)
Hemoglobin: 12.3 g/dL (ref 12.0–15.0)
LYMPHS ABS: 1.3 10*3/uL (ref 0.7–4.0)
Lymphocytes Relative: 34 %
MCH: 26.1 pg (ref 26.0–34.0)
MCHC: 33.2 g/dL (ref 30.0–36.0)
MCV: 78.6 fL (ref 78.0–100.0)
MONO ABS: 0.4 10*3/uL (ref 0.1–1.0)
MONOS PCT: 12 %
Neutro Abs: 1.9 10*3/uL (ref 1.7–7.7)
Neutrophils Relative %: 51 %
Platelets: 207 10*3/uL (ref 150–400)
RBC: 4.72 MIL/uL (ref 3.87–5.11)
RDW: 15 % (ref 11.5–15.5)
WBC: 3.7 10*3/uL — AB (ref 4.0–10.5)

## 2015-11-03 LAB — TROPONIN I: Troponin I: <0.03 ng/mL (ref <0.031)

## 2015-11-03 LAB — COMPREHENSIVE METABOLIC PANEL
ALK PHOS: 50 U/L (ref 38–126)
ALT: 16 U/L (ref 14–54)
AST: 22 U/L (ref 15–41)
Albumin: 3.7 g/dL (ref 3.5–5.0)
Anion gap: 11 (ref 5–15)
BUN: 8 mg/dL (ref 6–20)
CALCIUM: 9.1 mg/dL (ref 8.9–10.3)
CO2: 24 mmol/L (ref 22–32)
CREATININE: 1.04 mg/dL — AB (ref 0.44–1.00)
Chloride: 110 mmol/L (ref 101–111)
GFR calc non Af Amer: 57 mL/min — ABNORMAL LOW (ref >60)
GLUCOSE: 101 mg/dL — AB (ref 65–99)
Potassium: 3.4 mmol/L — ABNORMAL LOW (ref 3.5–5.1)
SODIUM: 145 mmol/L (ref 135–145)
Total Bilirubin: 0.6 mg/dL (ref 0.3–1.2)
Total Protein: 6.9 g/dL (ref 6.5–8.1)

## 2015-11-03 LAB — LIPASE, BLOOD: Lipase: 31 U/L (ref 11–51)

## 2015-11-03 LAB — PROTIME-INR
INR: 2.99 — AB (ref 0.00–1.49)
PROTHROMBIN TIME: 30.5 s — AB (ref 11.6–15.2)

## 2015-11-03 MED ORDER — OXYCODONE-ACETAMINOPHEN 5-325 MG PO TABS
1.0000 | ORAL_TABLET | Freq: Once | ORAL | Status: AC
  Administered 2015-11-03: 1 via ORAL
  Filled 2015-11-03: qty 1

## 2015-11-03 MED ORDER — BENZONATATE 100 MG PO CAPS: 100.0000 mg | ORAL_CAPSULE | Freq: Three times a day (TID) | ORAL | Status: DC

## 2015-11-03 MED ORDER — IPRATROPIUM-ALBUTEROL 0.5-2.5 (3) MG/3ML IN SOLN
3.0000 mL | Freq: Once | RESPIRATORY_TRACT | Status: AC
  Administered 2015-11-03: 3 mL via RESPIRATORY_TRACT
  Filled 2015-11-03: qty 3

## 2015-11-03 MED ORDER — GI COCKTAIL ~~LOC~~
30.0000 mL | Freq: Once | ORAL | Status: AC
  Administered 2015-11-03: 30 mL via ORAL
  Filled 2015-11-03: qty 30

## 2015-11-03 NOTE — ED Notes (Signed)
Pt ambulated in hallway without assistance. Pt O2 remained 99-100% and hr 78-85 bpm. Pt states that she felt SOB while walking ant that pain worsened with walking.

## 2015-11-03 NOTE — ED Notes (Signed)
MD at bedside. 
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
## 2015-11-03 NOTE — Discharge Instructions (Signed)
Your INR was 2.99 today.  Please contact your coumadin clinic for adjustments to your dosing.  Drink plenty of fluids.     Viral Infections A viral infection can be caused by different types of viruses.Most viral infections are not serious and resolve on their own. However, some infections may cause severe symptoms and may lead to further complications. SYMPTOMS Viruses can frequently cause:  Minor sore throat.  Aches and pains.  Headaches.  Runny nose.  Different types of rashes.  Watery eyes.  Tiredness.  Cough.  Loss of appetite.  Gastrointestinal infections, resulting in nausea, vomiting, and diarrhea. These symptoms do not respond to antibiotics because the infection is not caused by bacteria. However, you might catch a bacterial infection following the viral infection. This is sometimes called a "superinfection." Symptoms of such a bacterial infection may include:  Worsening sore throat with pus and difficulty swallowing.  Swollen neck glands.  Chills and a high or persistent fever.  Severe headache.  Tenderness over the sinuses.  Persistent overall ill feeling (malaise), muscle aches, and tiredness (fatigue).  Persistent cough.  Yellow, green, or brown mucus production with coughing. HOME CARE INSTRUCTIONS   Only take over-the-counter or prescription medicines for pain, discomfort, diarrhea, or fever as directed by your caregiver.  Drink enough water and fluids to keep your urine clear or pale yellow. Sports drinks can provide valuable electrolytes, sugars, and hydration.  Get plenty of rest and maintain proper nutrition. Soups and broths with crackers or rice are fine. SEEK IMMEDIATE MEDICAL CARE IF:   You have severe headaches, shortness of breath, chest pain, neck pain, or an unusual rash.  You have uncontrolled vomiting, diarrhea, or you are unable to keep down fluids.  You or your child has an oral temperature above 102 F (38.9 C), not  controlled by medicine.  Your baby is older than 3 months with a rectal temperature of 102 F (38.9 C) or higher.  Your baby is 51 months old or younger with a rectal temperature of 100.4 F (38 C) or higher. MAKE SURE YOU:   Understand these instructions.  Will watch your condition.  Will get help right away if you are not doing well or get worse.   This information is not intended to replace advice given to you by your health care provider. Make sure you discuss any questions you have with your health care provider.   Document Released: 06/30/2005 Document Revised: 12/13/2011 Document Reviewed: 02/26/2015 Elsevier Interactive Patient Education [employer].

## 2015-11-03 NOTE — ED Notes (Signed)
Provided patient with a meal bag.

## 2015-11-03 NOTE — ED Notes (Signed)
DUPNEB complete

## 2015-11-03 NOTE — ED Notes (Signed)
Pt sts mid sternal CP worse with inspiration and cough; pt sts some blood tinged sputum today

## 2015-11-26 IMAGING — CT CT HEAD W/O CM
1 series · 16 of 30 positions shown, 20 images · non-contrast
Comparison: 03/18/2014

CLINICAL DATA: Severe headaches since 3 p.m..

EXAM:
CT HEAD WITHOUT CONTRAST
TECHNIQUE: Contiguous axial images were obtained from the base of the skull
through the vertex without intravenous contrast.

[Series 2: head 4.8 h37s · axial · 0.47mm/px · z∈[+1074,+1230]mm · 16 of 36 slices shown, 20 images]
[im 2/36  brain]
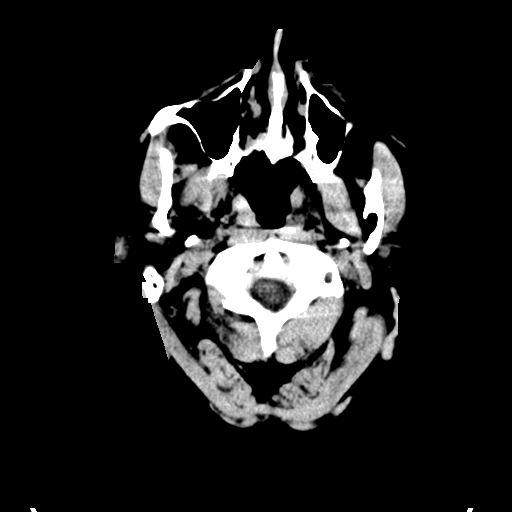
[im 2/36  bone]
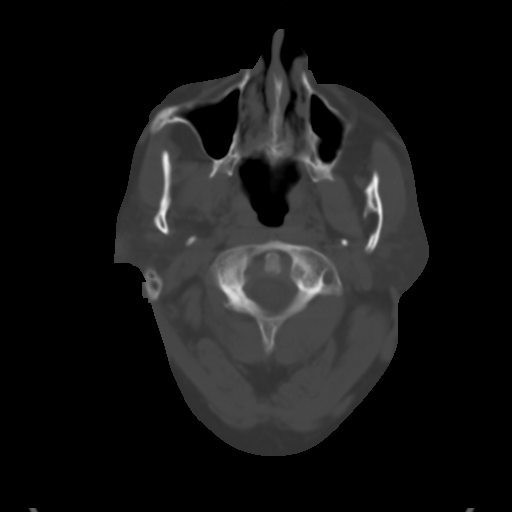
[im 4/36  brain]
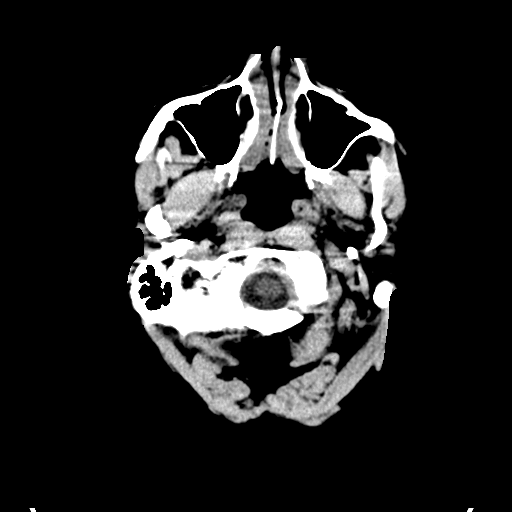
[im 7/36  brain]
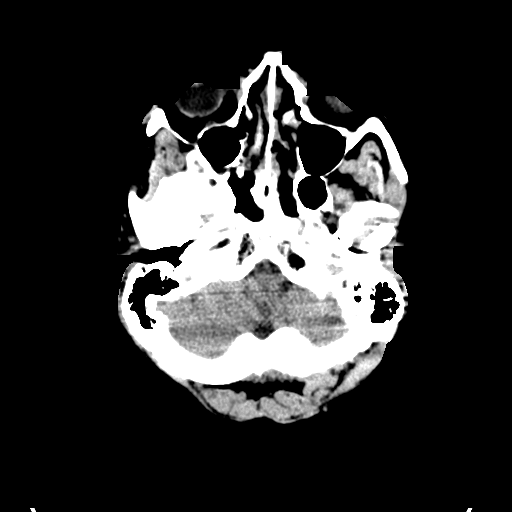
[im 9/36  brain]
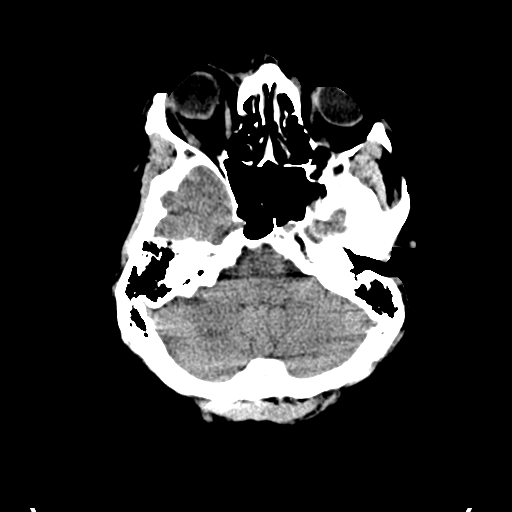
[im 10/36  brain]
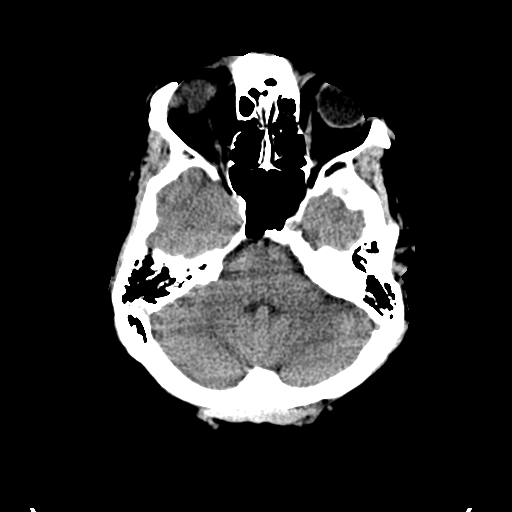
[im 10/36  bone]
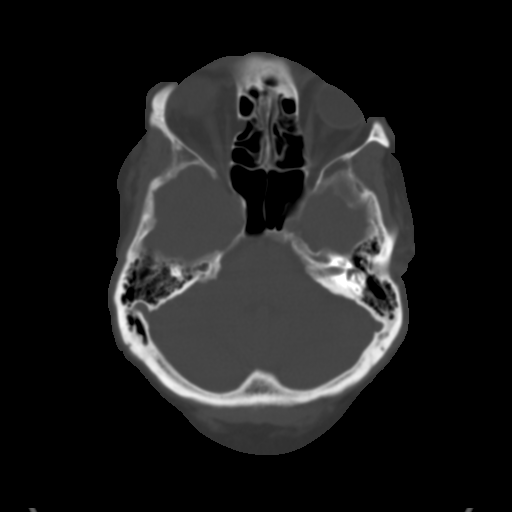
[im 13/36  brain]
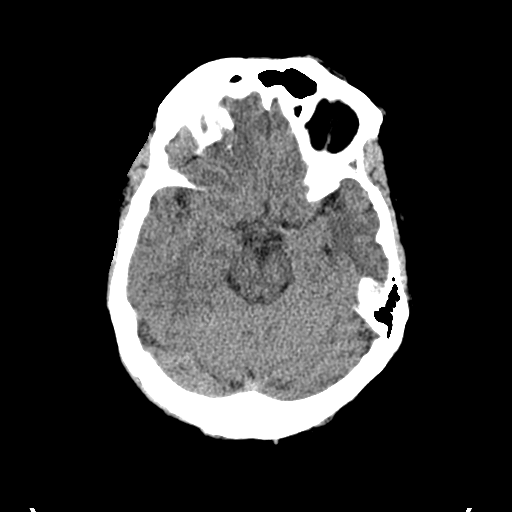
[im 15/36  brain]
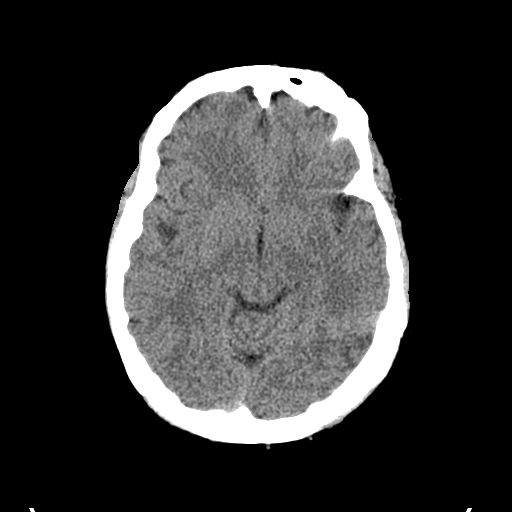
[im 17/36  brain]
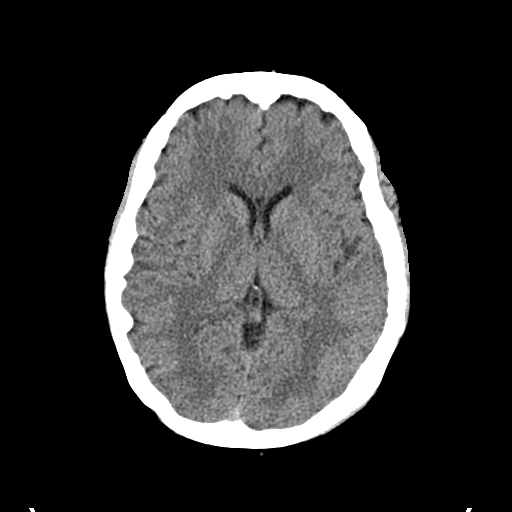
[im 19/36  brain]
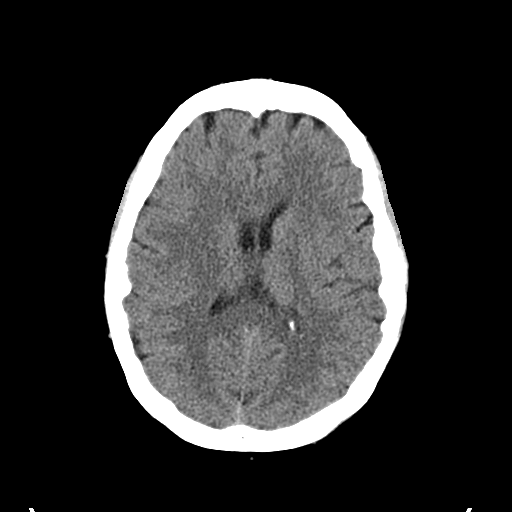
[im 19/36  bone]
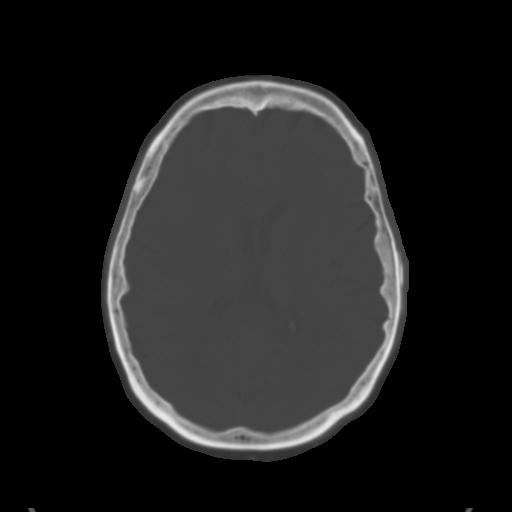
[im 21/36  brain]
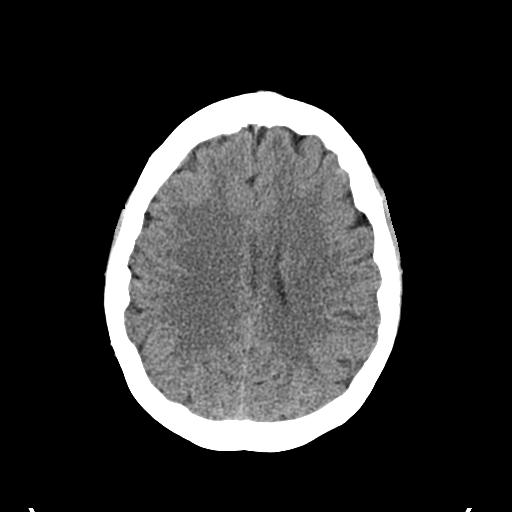
[im 23/36  brain]
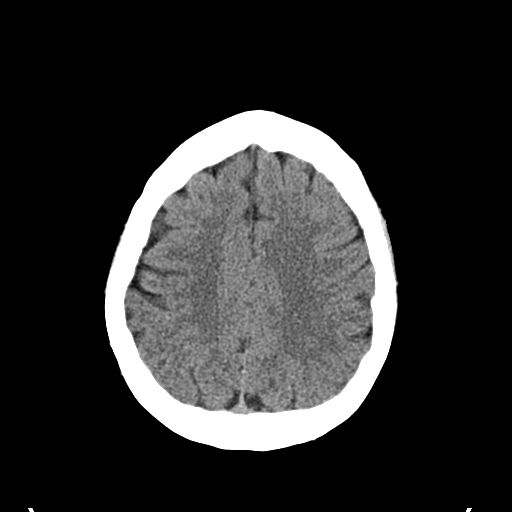
[im 26/36  brain]
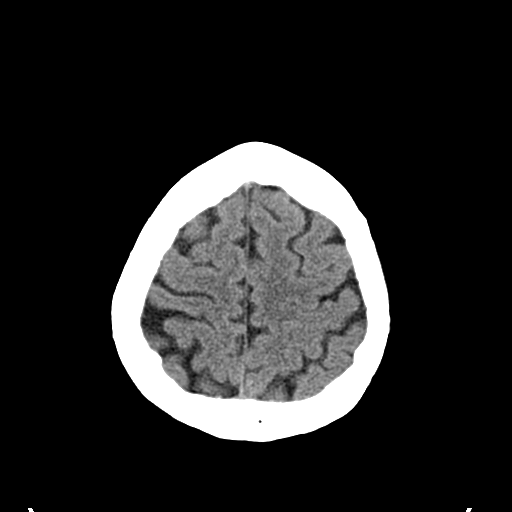
[im 27/36  brain]
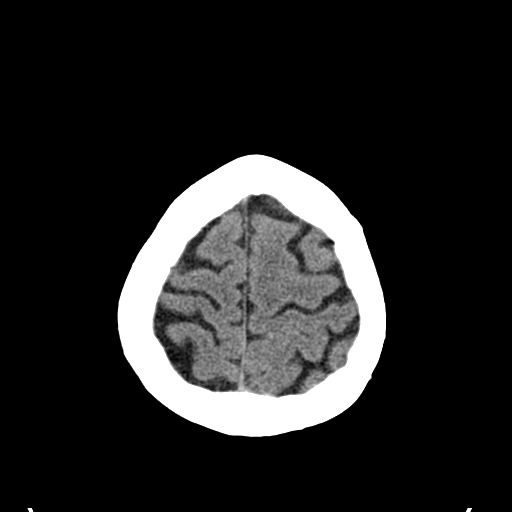
[im 27/36  bone]
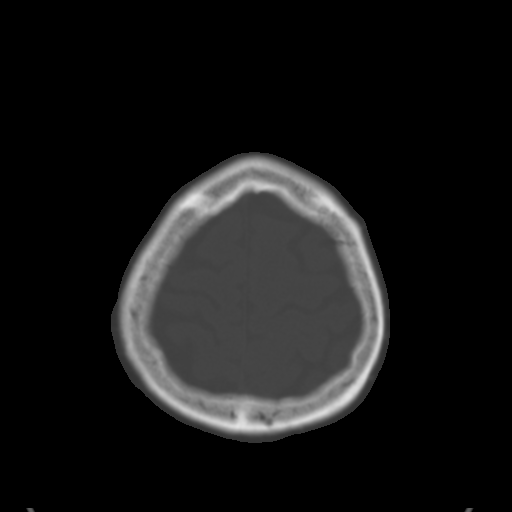
[im 29/36  brain]
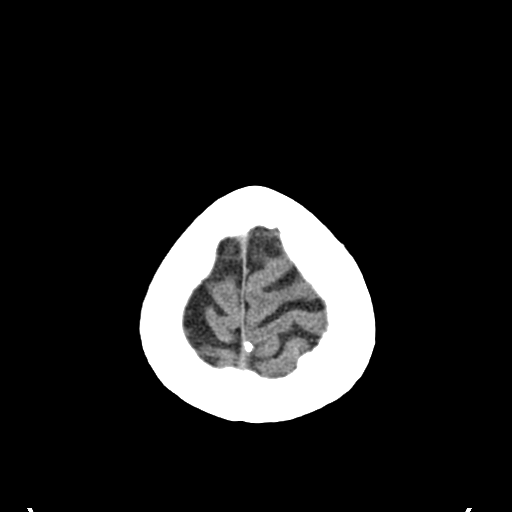
[im 32/36  brain]
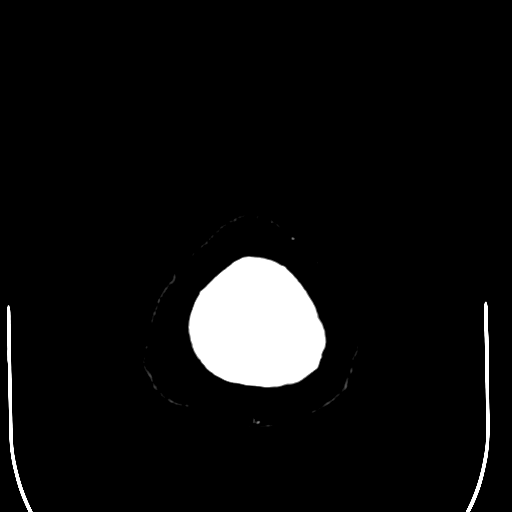
[im 34/36  brain]
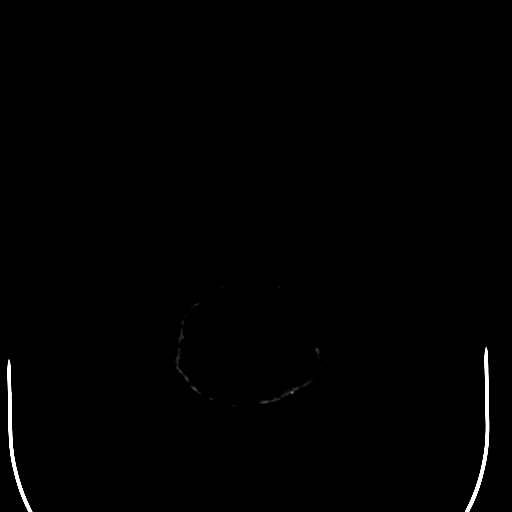

[16 of 30 positions shown; findings below may reference images not displayed]

FINDINGS: The ventricles, cisterns and other CSF spaces are within normal.
There is no mass, mass effect, shift of midline structures or acute
hemorrhage. No evidence of acute infarction. Remaining bones and
soft tissues are within normal.
IMPRESSION: No acute findings.

## 2015-12-22 ENCOUNTER — Ambulatory Visit: Payer: Medicare Other | Admitting: Neurology

## 2016-01-26 IMAGING — US US ABDOMEN COMPLETE
1 series · 14 of 25 positions shown · non-contrast
Comparison: CT abdomen and pelvis 11/12/2014.

CLINICAL DATA: Abdominal pain, nausea, vomiting and diarrhea since
11/12/2014.

EXAM:
ULTRASOUND ABDOMEN COMPLETE

[Series 1: us abdomen complete · 0.22mm/px · 14 of 95 slices shown]
[im 1/95]
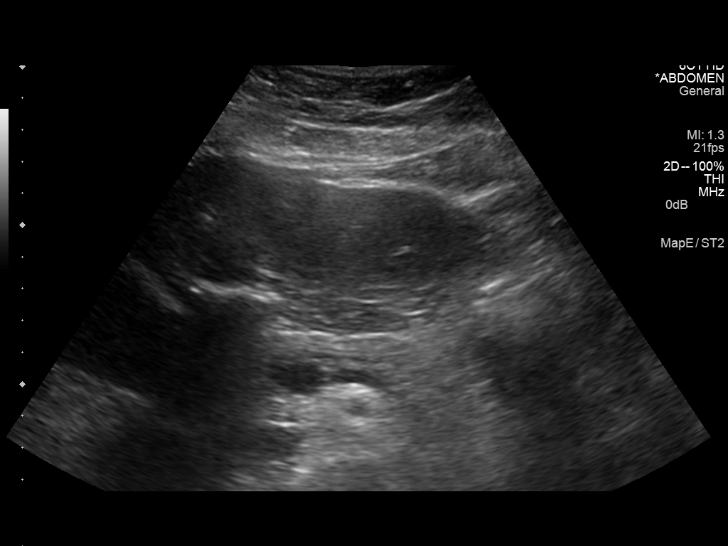
[im 8/95]
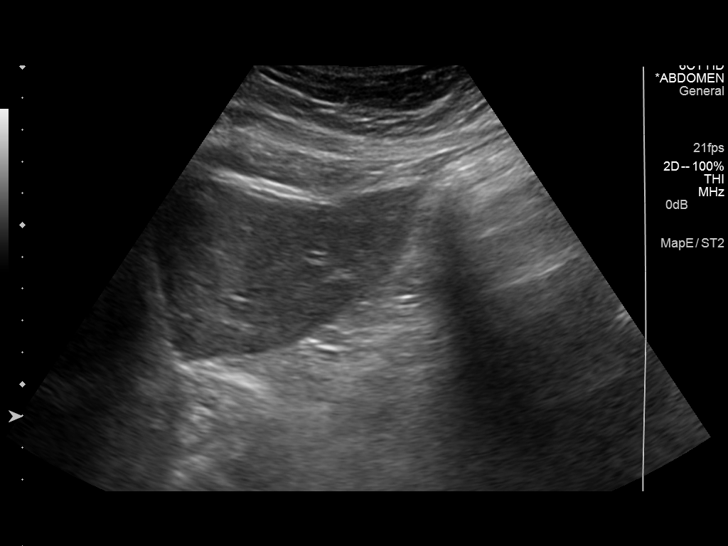
[im 16/95]
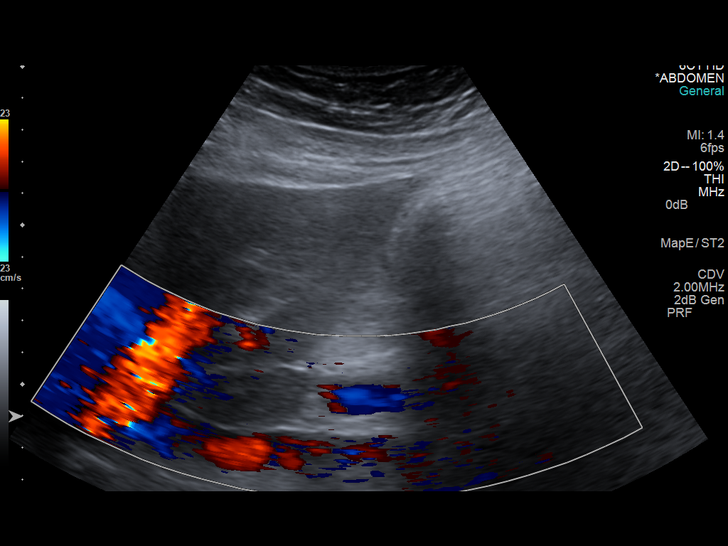
[im 24/95]
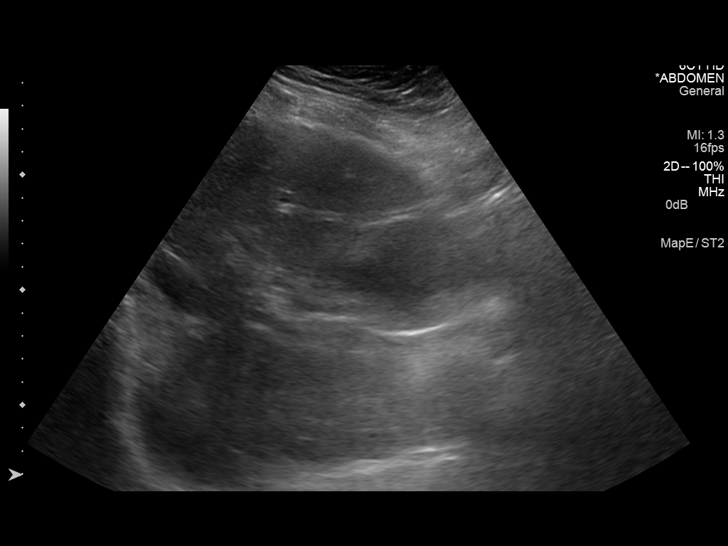
[im 32/95]
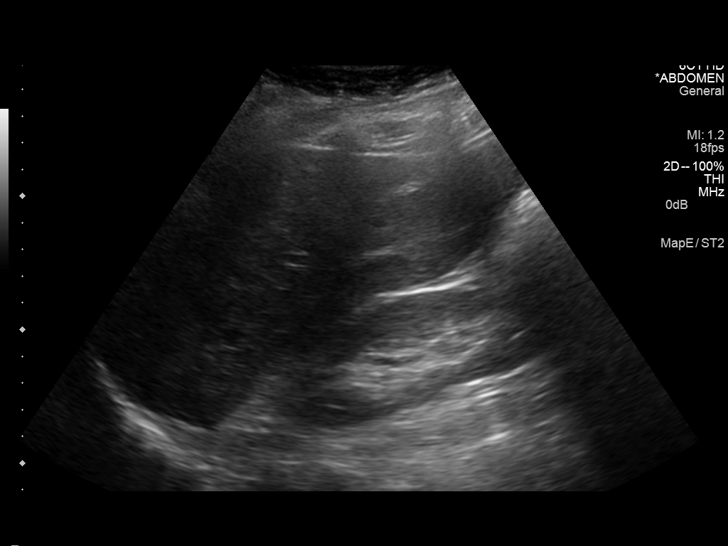
[im 36/95]
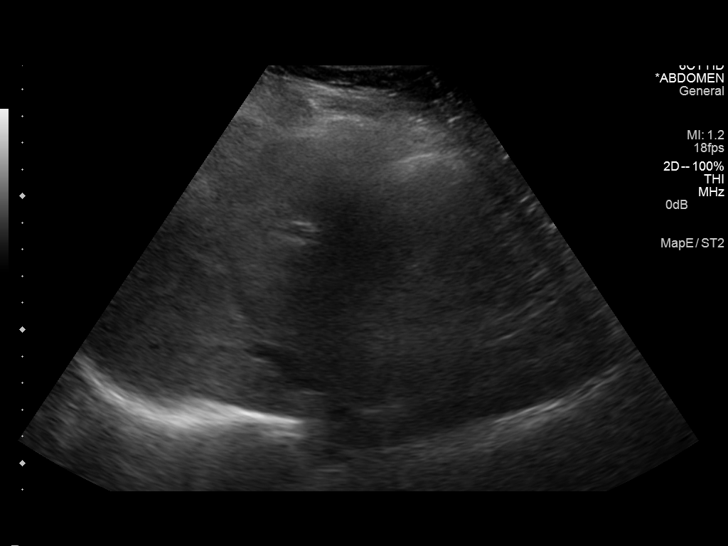
[im 44/95]
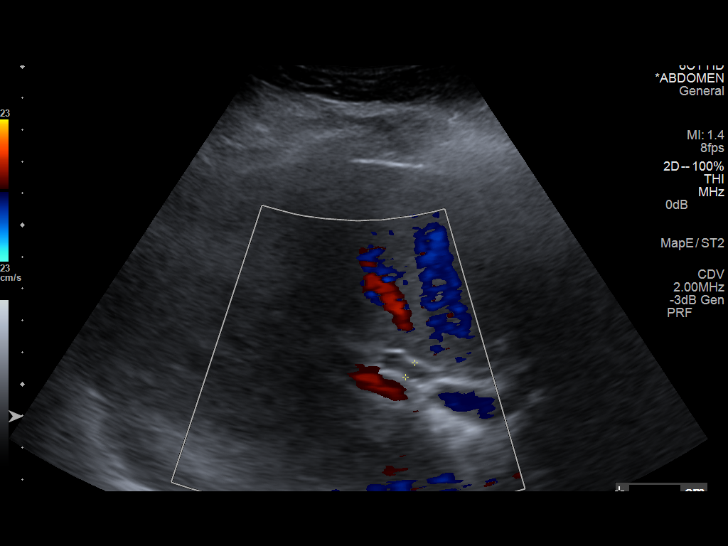
[im 51/95]
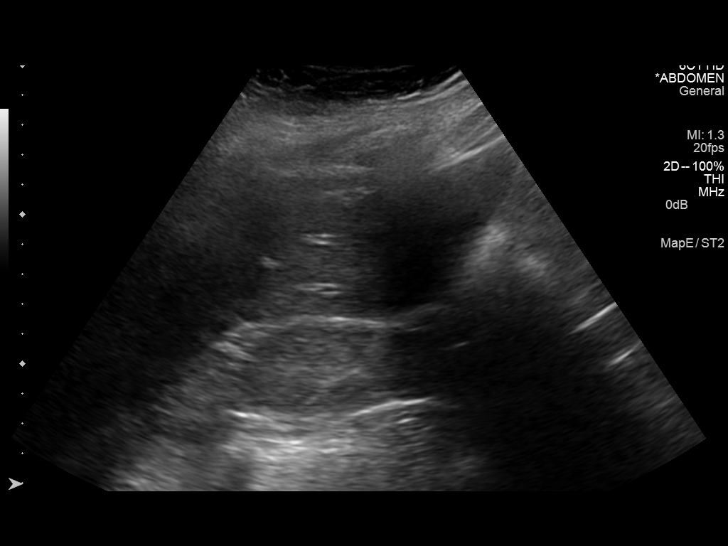
[im 59/95]
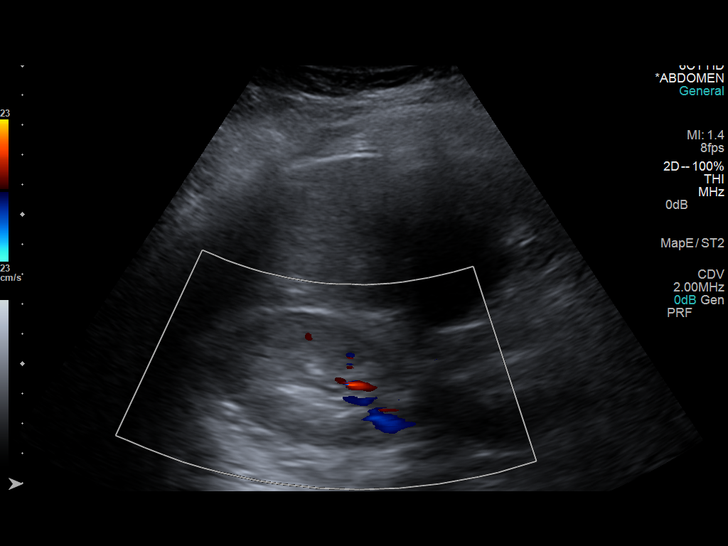
[im 63/95]
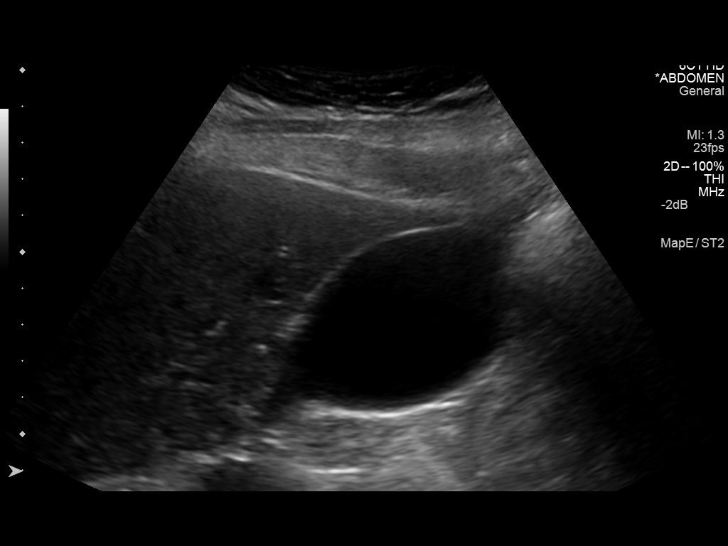
[im 71/95]
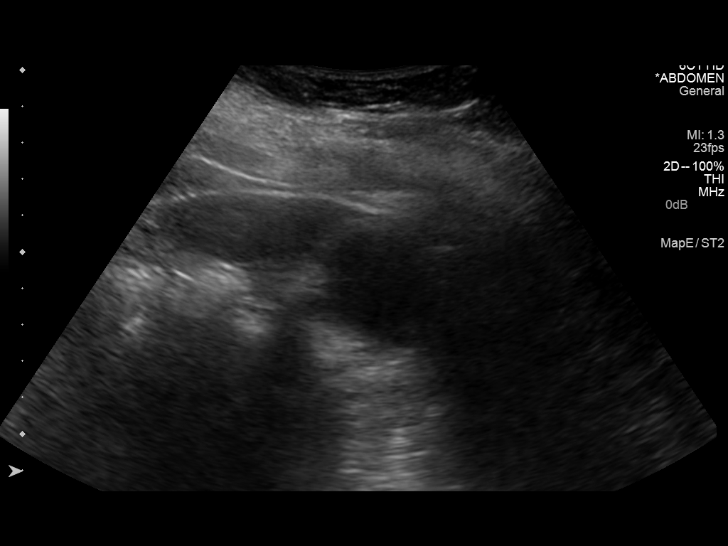
[im 79/95]
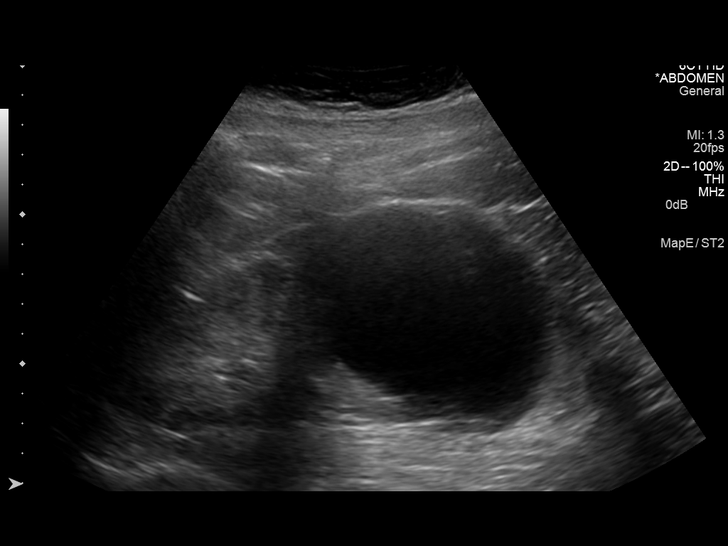
[im 87/95]
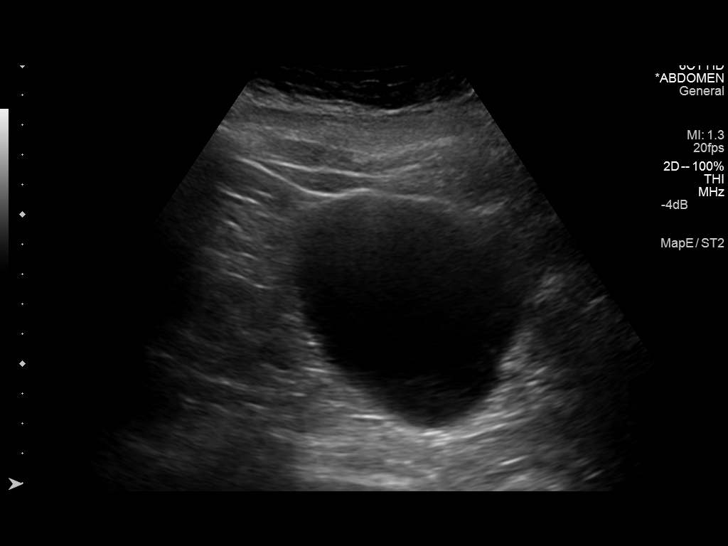
[im 95/95]
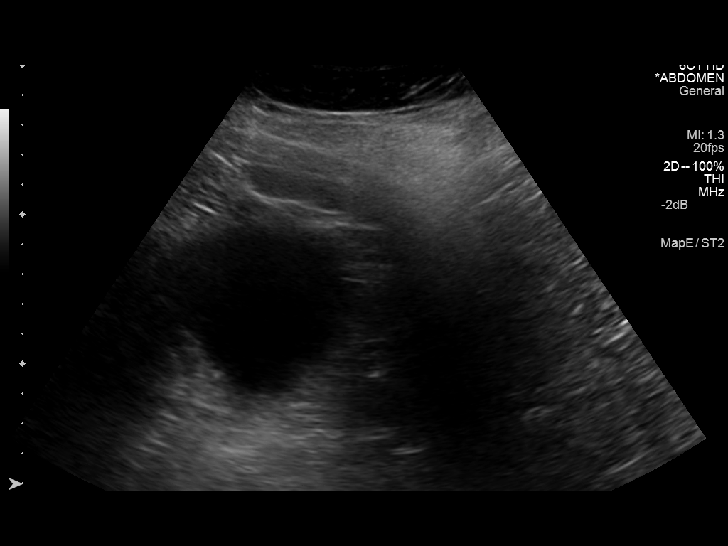

[14 of 25 positions shown; findings below may reference images not displayed]

FINDINGS: Gallbladder: No gallstones or wall thickening visualized. No
sonographic Murphy sign noted.

Common bile duct: Diameter: 0.5 cm.

Liver: No focal lesion identified. Within normal limits in
parenchymal echogenicity.

IVC: No abnormality visualized.

Pancreas: Visualized portion unremarkable.

Spleen: Size and appearance within normal limits. Small cysts or
hemangiomas in the spleen seen on prior CT scan are not visible on
this examination.

Right Kidney: Length: 11.3 cm. Echogenicity within normal limits. No
mass or hydronephrosis visualized.

Left Kidney: Length: 14.3 cm. Echogenicity within normal limits.
cm simple cyst is seen as on CT scan. No hydronephrosis visualized.

Abdominal aorta: No aneurysm visualized.

Other findings: None.
IMPRESSION: Negative for gallstones.  No acute abnormality.

## 2016-01-28 IMAGING — CR DG ABDOMEN 1V
1 series · 2 of 2 positions shown · non-contrast
Comparison: CT of the abdomen and pelvis 11/12/2014

CLINICAL DATA: Right upper quadrant abdominal pain this morning. No
nausea or vomiting.

EXAM:
ABDOMEN - 1 VIEW

[Series 1: ap (kub) · U · 2 of 2 slices shown]
[im 1/2]
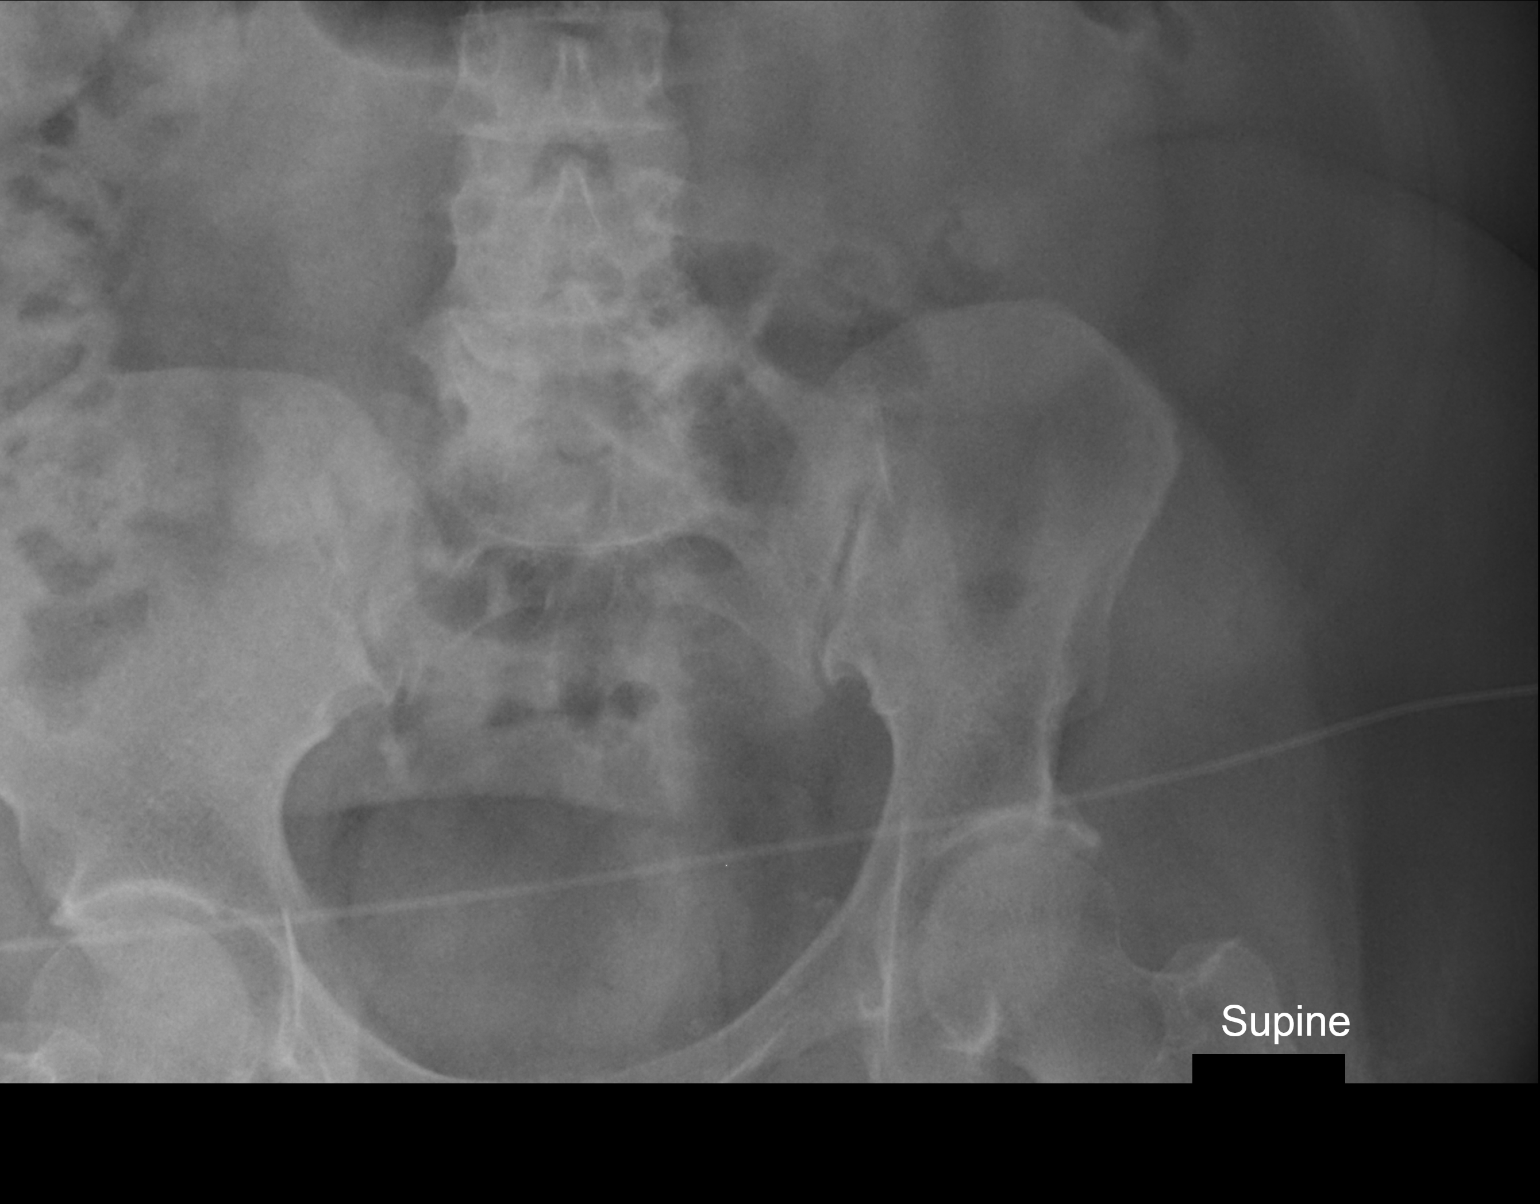
[im 2/2]
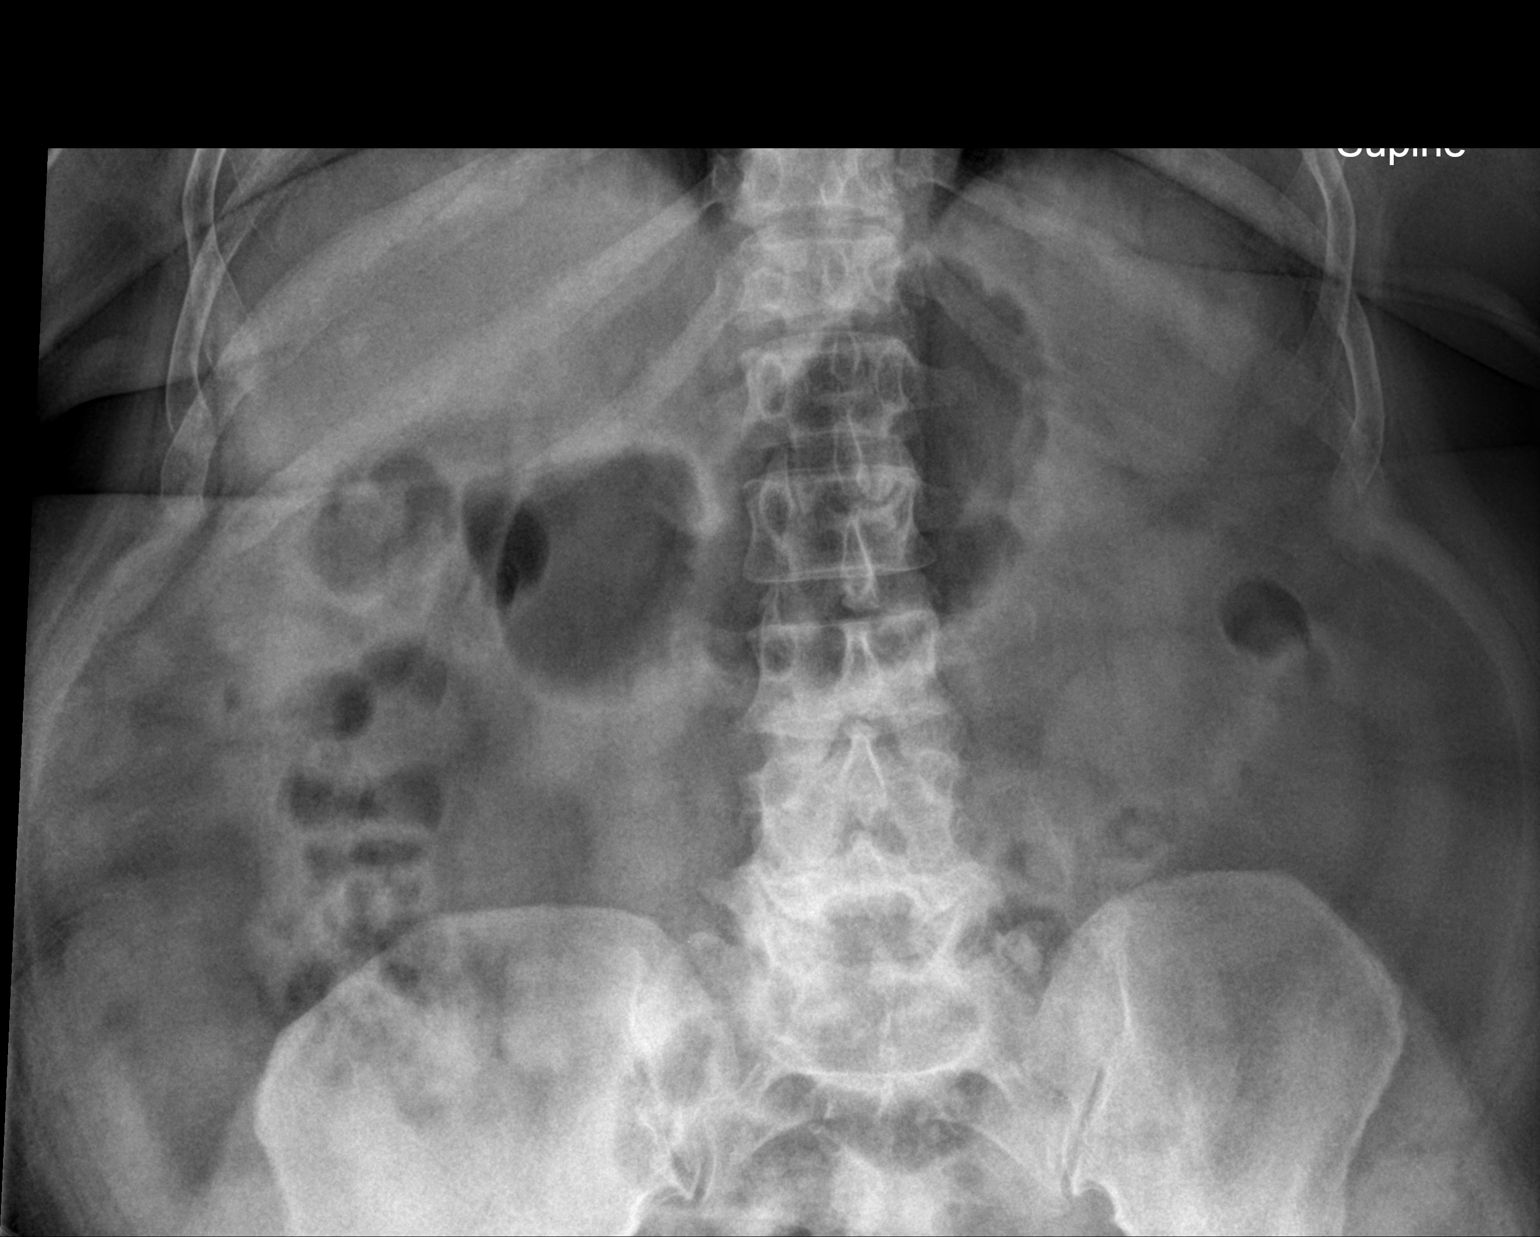

[2 of 2 positions shown; findings below may reference images not displayed]

FINDINGS: There is minimal residual contrast in the colon following recent CT
exam. Bowel gas pattern is nonobstructive. There is minimal
degenerative change in the spine.
IMPRESSION: Nonobstructive bowel gas pattern.

## 2016-02-05 DIAGNOSIS — T8484XA Pain due to internal orthopedic prosthetic devices, implants and grafts, initial encounter: Secondary | ICD-10-CM | POA: Diagnosis not present

## 2016-02-05 DIAGNOSIS — M25562 Pain in left knee: Secondary | ICD-10-CM | POA: Diagnosis not present

## 2016-02-05 DIAGNOSIS — Z96652 Presence of left artificial knee joint: Secondary | ICD-10-CM | POA: Diagnosis not present

## 2016-02-20 DIAGNOSIS — Z79891 Long term (current) use of opiate analgesic: Secondary | ICD-10-CM | POA: Diagnosis not present

## 2016-02-20 DIAGNOSIS — G541 Lumbosacral plexus disorders: Secondary | ICD-10-CM | POA: Diagnosis not present

## 2016-02-20 DIAGNOSIS — M5137 Other intervertebral disc degeneration, lumbosacral region: Secondary | ICD-10-CM | POA: Diagnosis not present

## 2016-02-20 DIAGNOSIS — M25512 Pain in left shoulder: Secondary | ICD-10-CM | POA: Diagnosis not present

## 2016-03-03 DIAGNOSIS — I10 Essential (primary) hypertension: Secondary | ICD-10-CM | POA: Diagnosis not present

## 2016-03-03 DIAGNOSIS — E7800 Pure hypercholesterolemia, unspecified: Secondary | ICD-10-CM | POA: Diagnosis not present

## 2016-03-03 DIAGNOSIS — Z7901 Long term (current) use of anticoagulants: Secondary | ICD-10-CM | POA: Diagnosis not present

## 2016-03-03 DIAGNOSIS — K219 Gastro-esophageal reflux disease without esophagitis: Secondary | ICD-10-CM | POA: Diagnosis not present

## 2016-03-03 DIAGNOSIS — I4891 Unspecified atrial fibrillation: Secondary | ICD-10-CM | POA: Diagnosis not present

## 2016-03-22 DIAGNOSIS — Z79891 Long term (current) use of opiate analgesic: Secondary | ICD-10-CM | POA: Diagnosis not present

## 2016-03-22 DIAGNOSIS — M79621 Pain in right upper arm: Secondary | ICD-10-CM | POA: Diagnosis not present

## 2016-03-22 DIAGNOSIS — M25552 Pain in left hip: Secondary | ICD-10-CM | POA: Diagnosis not present

## 2016-03-22 DIAGNOSIS — M25562 Pain in left knee: Secondary | ICD-10-CM | POA: Diagnosis not present

## 2016-03-22 DIAGNOSIS — G894 Chronic pain syndrome: Secondary | ICD-10-CM | POA: Diagnosis not present

## 2016-03-22 DIAGNOSIS — M79601 Pain in right arm: Secondary | ICD-10-CM | POA: Diagnosis not present

## 2016-03-25 DIAGNOSIS — Z96652 Presence of left artificial knee joint: Secondary | ICD-10-CM | POA: Diagnosis not present

## 2016-03-25 DIAGNOSIS — M5416 Radiculopathy, lumbar region: Secondary | ICD-10-CM | POA: Diagnosis not present

## 2016-03-25 DIAGNOSIS — T8484XA Pain due to internal orthopedic prosthetic devices, implants and grafts, initial encounter: Secondary | ICD-10-CM | POA: Diagnosis not present

## 2016-04-16 DIAGNOSIS — Z8673 Personal history of transient ischemic attack (TIA), and cerebral infarction without residual deficits: Secondary | ICD-10-CM | POA: Diagnosis not present

## 2016-04-16 DIAGNOSIS — K449 Diaphragmatic hernia without obstruction or gangrene: Secondary | ICD-10-CM | POA: Diagnosis not present

## 2016-04-16 DIAGNOSIS — D7389 Other diseases of spleen: Secondary | ICD-10-CM | POA: Diagnosis not present

## 2016-04-16 DIAGNOSIS — N281 Cyst of kidney, acquired: Secondary | ICD-10-CM | POA: Diagnosis not present

## 2016-04-16 DIAGNOSIS — Z7901 Long term (current) use of anticoagulants: Secondary | ICD-10-CM | POA: Diagnosis not present

## 2016-04-16 DIAGNOSIS — I517 Cardiomegaly: Secondary | ICD-10-CM | POA: Diagnosis not present

## 2016-04-16 DIAGNOSIS — I10 Essential (primary) hypertension: Secondary | ICD-10-CM | POA: Diagnosis not present

## 2016-04-16 DIAGNOSIS — R55 Syncope and collapse: Secondary | ICD-10-CM | POA: Diagnosis not present

## 2016-04-16 DIAGNOSIS — Z888 Allergy status to other drugs, medicaments and biological substances status: Secondary | ICD-10-CM | POA: Diagnosis not present

## 2016-04-16 DIAGNOSIS — Z87891 Personal history of nicotine dependence: Secondary | ICD-10-CM | POA: Diagnosis not present

## 2016-04-16 DIAGNOSIS — R1011 Right upper quadrant pain: Secondary | ICD-10-CM | POA: Diagnosis not present

## 2016-04-16 DIAGNOSIS — M47816 Spondylosis without myelopathy or radiculopathy, lumbar region: Secondary | ICD-10-CM | POA: Diagnosis not present

## 2016-04-16 DIAGNOSIS — R109 Unspecified abdominal pain: Secondary | ICD-10-CM | POA: Diagnosis not present

## 2016-04-16 DIAGNOSIS — R1084 Generalized abdominal pain: Secondary | ICD-10-CM | POA: Diagnosis not present

## 2016-04-16 DIAGNOSIS — I739 Peripheral vascular disease, unspecified: Secondary | ICD-10-CM | POA: Diagnosis not present

## 2016-04-16 DIAGNOSIS — Z886 Allergy status to analgesic agent status: Secondary | ICD-10-CM | POA: Diagnosis not present

## 2016-04-16 DIAGNOSIS — K5730 Diverticulosis of large intestine without perforation or abscess without bleeding: Secondary | ICD-10-CM | POA: Diagnosis not present

## 2016-04-16 DIAGNOSIS — R1013 Epigastric pain: Secondary | ICD-10-CM | POA: Diagnosis not present

## 2016-04-16 DIAGNOSIS — Z885 Allergy status to narcotic agent status: Secondary | ICD-10-CM | POA: Diagnosis not present

## 2016-04-16 DIAGNOSIS — R112 Nausea with vomiting, unspecified: Secondary | ICD-10-CM | POA: Diagnosis not present

## 2016-04-20 DIAGNOSIS — Z79891 Long term (current) use of opiate analgesic: Secondary | ICD-10-CM | POA: Diagnosis not present

## 2016-04-20 DIAGNOSIS — G894 Chronic pain syndrome: Secondary | ICD-10-CM | POA: Diagnosis not present

## 2016-04-20 DIAGNOSIS — M25562 Pain in left knee: Secondary | ICD-10-CM | POA: Diagnosis not present

## 2016-04-20 DIAGNOSIS — M79601 Pain in right arm: Secondary | ICD-10-CM | POA: Diagnosis not present

## 2016-04-20 DIAGNOSIS — M79621 Pain in right upper arm: Secondary | ICD-10-CM | POA: Diagnosis not present

## 2016-04-23 DIAGNOSIS — M4727 Other spondylosis with radiculopathy, lumbosacral region: Secondary | ICD-10-CM | POA: Diagnosis not present

## 2016-04-23 DIAGNOSIS — M4726 Other spondylosis with radiculopathy, lumbar region: Secondary | ICD-10-CM | POA: Diagnosis not present

## 2016-04-23 DIAGNOSIS — M5116 Intervertebral disc disorders with radiculopathy, lumbar region: Secondary | ICD-10-CM | POA: Diagnosis not present

## 2016-04-23 DIAGNOSIS — M5117 Intervertebral disc disorders with radiculopathy, lumbosacral region: Secondary | ICD-10-CM | POA: Diagnosis not present

## 2016-04-23 DIAGNOSIS — M25562 Pain in left knee: Secondary | ICD-10-CM | POA: Diagnosis not present

## 2016-04-23 DIAGNOSIS — Z9889 Other specified postprocedural states: Secondary | ICD-10-CM | POA: Diagnosis not present

## 2016-04-23 DIAGNOSIS — Z96652 Presence of left artificial knee joint: Secondary | ICD-10-CM | POA: Diagnosis not present

## 2016-04-24 DIAGNOSIS — Z888 Allergy status to other drugs, medicaments and biological substances status: Secondary | ICD-10-CM | POA: Diagnosis not present

## 2016-04-24 DIAGNOSIS — I739 Peripheral vascular disease, unspecified: Secondary | ICD-10-CM | POA: Diagnosis not present

## 2016-04-24 DIAGNOSIS — R609 Edema, unspecified: Secondary | ICD-10-CM | POA: Diagnosis not present

## 2016-04-24 DIAGNOSIS — Z886 Allergy status to analgesic agent status: Secondary | ICD-10-CM | POA: Diagnosis not present

## 2016-04-24 DIAGNOSIS — Z7901 Long term (current) use of anticoagulants: Secondary | ICD-10-CM | POA: Diagnosis not present

## 2016-04-24 DIAGNOSIS — M47815 Spondylosis without myelopathy or radiculopathy, thoracolumbar region: Secondary | ICD-10-CM | POA: Diagnosis not present

## 2016-04-24 DIAGNOSIS — Z8673 Personal history of transient ischemic attack (TIA), and cerebral infarction without residual deficits: Secondary | ICD-10-CM | POA: Diagnosis not present

## 2016-04-24 DIAGNOSIS — Z8249 Family history of ischemic heart disease and other diseases of the circulatory system: Secondary | ICD-10-CM | POA: Diagnosis not present

## 2016-04-24 DIAGNOSIS — I10 Essential (primary) hypertension: Secondary | ICD-10-CM | POA: Diagnosis not present

## 2016-04-24 DIAGNOSIS — R0789 Other chest pain: Secondary | ICD-10-CM | POA: Diagnosis not present

## 2016-04-24 DIAGNOSIS — Z87891 Personal history of nicotine dependence: Secondary | ICD-10-CM | POA: Diagnosis not present

## 2016-04-24 DIAGNOSIS — M7989 Other specified soft tissue disorders: Secondary | ICD-10-CM | POA: Diagnosis not present

## 2016-04-24 DIAGNOSIS — E870 Hyperosmolality and hypernatremia: Secondary | ICD-10-CM | POA: Diagnosis not present

## 2016-04-24 DIAGNOSIS — Z780 Asymptomatic menopausal state: Secondary | ICD-10-CM | POA: Diagnosis not present

## 2016-04-24 DIAGNOSIS — Z885 Allergy status to narcotic agent status: Secondary | ICD-10-CM | POA: Diagnosis not present

## 2016-04-24 DIAGNOSIS — Z79899 Other long term (current) drug therapy: Secondary | ICD-10-CM | POA: Diagnosis not present

## 2016-04-24 DIAGNOSIS — R0902 Hypoxemia: Secondary | ICD-10-CM | POA: Diagnosis not present

## 2016-04-24 DIAGNOSIS — R0602 Shortness of breath: Secondary | ICD-10-CM | POA: Diagnosis not present

## 2016-04-24 DIAGNOSIS — R079 Chest pain, unspecified: Secondary | ICD-10-CM | POA: Diagnosis not present

## 2016-04-24 DIAGNOSIS — I482 Chronic atrial fibrillation: Secondary | ICD-10-CM | POA: Diagnosis not present

## 2016-04-24 DIAGNOSIS — I480 Paroxysmal atrial fibrillation: Secondary | ICD-10-CM | POA: Diagnosis not present

## 2016-04-24 DIAGNOSIS — E7800 Pure hypercholesterolemia, unspecified: Secondary | ICD-10-CM | POA: Diagnosis not present

## 2016-04-25 DIAGNOSIS — E870 Hyperosmolality and hypernatremia: Secondary | ICD-10-CM | POA: Diagnosis not present

## 2016-04-25 DIAGNOSIS — I482 Chronic atrial fibrillation: Secondary | ICD-10-CM | POA: Diagnosis not present

## 2016-04-25 DIAGNOSIS — R079 Chest pain, unspecified: Secondary | ICD-10-CM | POA: Diagnosis not present

## 2016-04-26 DIAGNOSIS — I482 Chronic atrial fibrillation: Secondary | ICD-10-CM | POA: Diagnosis not present

## 2016-04-26 DIAGNOSIS — E870 Hyperosmolality and hypernatremia: Secondary | ICD-10-CM | POA: Diagnosis not present

## 2016-04-28 DIAGNOSIS — Z5181 Encounter for therapeutic drug level monitoring: Secondary | ICD-10-CM | POA: Diagnosis not present

## 2016-04-28 DIAGNOSIS — I4891 Unspecified atrial fibrillation: Secondary | ICD-10-CM | POA: Diagnosis not present

## 2016-04-28 DIAGNOSIS — Z7901 Long term (current) use of anticoagulants: Secondary | ICD-10-CM | POA: Diagnosis not present

## 2016-04-28 DIAGNOSIS — Z79899 Other long term (current) drug therapy: Secondary | ICD-10-CM | POA: Diagnosis not present

## 2016-04-28 DIAGNOSIS — Z8673 Personal history of transient ischemic attack (TIA), and cerebral infarction without residual deficits: Secondary | ICD-10-CM | POA: Diagnosis not present

## 2016-04-28 DIAGNOSIS — R600 Localized edema: Secondary | ICD-10-CM | POA: Diagnosis not present

## 2016-04-30 DIAGNOSIS — I10 Essential (primary) hypertension: Secondary | ICD-10-CM | POA: Diagnosis not present

## 2016-04-30 DIAGNOSIS — R600 Localized edema: Secondary | ICD-10-CM | POA: Diagnosis not present

## 2016-04-30 DIAGNOSIS — Z7901 Long term (current) use of anticoagulants: Secondary | ICD-10-CM | POA: Diagnosis not present

## 2016-04-30 DIAGNOSIS — E7800 Pure hypercholesterolemia, unspecified: Secondary | ICD-10-CM | POA: Diagnosis not present

## 2016-04-30 DIAGNOSIS — I482 Chronic atrial fibrillation: Secondary | ICD-10-CM | POA: Diagnosis not present

## 2016-05-12 DIAGNOSIS — M5416 Radiculopathy, lumbar region: Secondary | ICD-10-CM | POA: Diagnosis not present

## 2016-05-12 DIAGNOSIS — T8484XA Pain due to internal orthopedic prosthetic devices, implants and grafts, initial encounter: Secondary | ICD-10-CM | POA: Diagnosis not present

## 2016-05-12 DIAGNOSIS — Z96659 Presence of unspecified artificial knee joint: Secondary | ICD-10-CM | POA: Diagnosis not present

## 2016-05-25 DIAGNOSIS — R9431 Abnormal electrocardiogram [ECG] [EKG]: Secondary | ICD-10-CM | POA: Diagnosis not present

## 2016-05-25 DIAGNOSIS — I2510 Atherosclerotic heart disease of native coronary artery without angina pectoris: Secondary | ICD-10-CM | POA: Diagnosis not present

## 2016-05-25 DIAGNOSIS — Z7901 Long term (current) use of anticoagulants: Secondary | ICD-10-CM | POA: Diagnosis not present

## 2016-05-25 DIAGNOSIS — R0789 Other chest pain: Secondary | ICD-10-CM | POA: Diagnosis not present

## 2016-05-25 DIAGNOSIS — I517 Cardiomegaly: Secondary | ICD-10-CM | POA: Diagnosis not present

## 2016-05-25 DIAGNOSIS — K219 Gastro-esophageal reflux disease without esophagitis: Secondary | ICD-10-CM | POA: Diagnosis not present

## 2016-05-25 DIAGNOSIS — R079 Chest pain, unspecified: Secondary | ICD-10-CM | POA: Diagnosis not present

## 2016-05-25 DIAGNOSIS — I4891 Unspecified atrial fibrillation: Secondary | ICD-10-CM | POA: Diagnosis not present

## 2016-05-25 DIAGNOSIS — Z886 Allergy status to analgesic agent status: Secondary | ICD-10-CM | POA: Diagnosis not present

## 2016-05-25 DIAGNOSIS — Z888 Allergy status to other drugs, medicaments and biological substances status: Secondary | ICD-10-CM | POA: Diagnosis not present

## 2016-05-25 DIAGNOSIS — Z79899 Other long term (current) drug therapy: Secondary | ICD-10-CM | POA: Diagnosis not present

## 2016-05-25 DIAGNOSIS — E785 Hyperlipidemia, unspecified: Secondary | ICD-10-CM | POA: Diagnosis not present

## 2016-05-31 DIAGNOSIS — Z79891 Long term (current) use of opiate analgesic: Secondary | ICD-10-CM | POA: Diagnosis not present

## 2016-05-31 DIAGNOSIS — M79621 Pain in right upper arm: Secondary | ICD-10-CM | POA: Diagnosis not present

## 2016-05-31 DIAGNOSIS — G894 Chronic pain syndrome: Secondary | ICD-10-CM | POA: Diagnosis not present

## 2016-05-31 DIAGNOSIS — M79601 Pain in right arm: Secondary | ICD-10-CM | POA: Diagnosis not present

## 2016-05-31 DIAGNOSIS — M25562 Pain in left knee: Secondary | ICD-10-CM | POA: Diagnosis not present

## 2016-06-14 DIAGNOSIS — M25462 Effusion, left knee: Secondary | ICD-10-CM | POA: Diagnosis not present

## 2016-06-14 DIAGNOSIS — Z96652 Presence of left artificial knee joint: Secondary | ICD-10-CM | POA: Diagnosis not present

## 2016-06-14 DIAGNOSIS — T310 Burns involving less than 10% of body surface: Secondary | ICD-10-CM | POA: Diagnosis not present

## 2016-06-14 DIAGNOSIS — M79631 Pain in right forearm: Secondary | ICD-10-CM | POA: Diagnosis not present

## 2016-06-14 DIAGNOSIS — M25562 Pain in left knee: Secondary | ICD-10-CM | POA: Diagnosis not present

## 2016-06-14 DIAGNOSIS — T23301A Burn of third degree of right hand, unspecified site, initial encounter: Secondary | ICD-10-CM | POA: Diagnosis not present

## 2016-06-14 DIAGNOSIS — M19021 Primary osteoarthritis, right elbow: Secondary | ICD-10-CM | POA: Diagnosis not present

## 2016-06-14 DIAGNOSIS — M19011 Primary osteoarthritis, right shoulder: Secondary | ICD-10-CM | POA: Diagnosis not present

## 2016-06-14 DIAGNOSIS — M25421 Effusion, right elbow: Secondary | ICD-10-CM | POA: Diagnosis not present

## 2016-06-14 DIAGNOSIS — S59911A Unspecified injury of right forearm, initial encounter: Secondary | ICD-10-CM | POA: Diagnosis not present

## 2016-06-14 DIAGNOSIS — M25511 Pain in right shoulder: Secondary | ICD-10-CM | POA: Diagnosis not present

## 2016-06-15 DIAGNOSIS — R450 Nervousness: Secondary | ICD-10-CM | POA: Diagnosis not present

## 2016-06-15 DIAGNOSIS — Z86711 Personal history of pulmonary embolism: Secondary | ICD-10-CM | POA: Diagnosis not present

## 2016-06-15 DIAGNOSIS — R21 Rash and other nonspecific skin eruption: Secondary | ICD-10-CM | POA: Diagnosis not present

## 2016-06-15 DIAGNOSIS — R51 Headache: Secondary | ICD-10-CM | POA: Diagnosis not present

## 2016-06-15 DIAGNOSIS — Z8673 Personal history of transient ischemic attack (TIA), and cerebral infarction without residual deficits: Secondary | ICD-10-CM | POA: Diagnosis not present

## 2016-06-15 DIAGNOSIS — Z7901 Long term (current) use of anticoagulants: Secondary | ICD-10-CM | POA: Diagnosis not present

## 2016-06-15 DIAGNOSIS — H5713 Ocular pain, bilateral: Secondary | ICD-10-CM | POA: Diagnosis not present

## 2016-06-16 DIAGNOSIS — S8992XA Unspecified injury of left lower leg, initial encounter: Secondary | ICD-10-CM | POA: Diagnosis not present

## 2016-06-16 DIAGNOSIS — T8484XA Pain due to internal orthopedic prosthetic devices, implants and grafts, initial encounter: Secondary | ICD-10-CM | POA: Diagnosis not present

## 2016-06-16 DIAGNOSIS — Z96659 Presence of unspecified artificial knee joint: Secondary | ICD-10-CM | POA: Diagnosis not present

## 2016-06-16 DIAGNOSIS — M5416 Radiculopathy, lumbar region: Secondary | ICD-10-CM | POA: Diagnosis not present

## 2016-06-29 DIAGNOSIS — H539 Unspecified visual disturbance: Secondary | ICD-10-CM | POA: Diagnosis not present

## 2016-06-29 DIAGNOSIS — I10 Essential (primary) hypertension: Secondary | ICD-10-CM | POA: Diagnosis not present

## 2016-06-29 DIAGNOSIS — Z1231 Encounter for screening mammogram for malignant neoplasm of breast: Secondary | ICD-10-CM | POA: Diagnosis not present

## 2016-06-29 DIAGNOSIS — Z0000 Encounter for general adult medical examination without abnormal findings: Secondary | ICD-10-CM | POA: Diagnosis not present

## 2016-06-29 DIAGNOSIS — Z1239 Encounter for other screening for malignant neoplasm of breast: Secondary | ICD-10-CM | POA: Diagnosis not present

## 2016-06-29 DIAGNOSIS — Z23 Encounter for immunization: Secondary | ICD-10-CM | POA: Diagnosis not present

## 2016-06-30 DIAGNOSIS — G894 Chronic pain syndrome: Secondary | ICD-10-CM | POA: Diagnosis not present

## 2016-06-30 DIAGNOSIS — M5416 Radiculopathy, lumbar region: Secondary | ICD-10-CM | POA: Diagnosis not present

## 2016-06-30 DIAGNOSIS — Z79891 Long term (current) use of opiate analgesic: Secondary | ICD-10-CM | POA: Diagnosis not present

## 2016-06-30 DIAGNOSIS — M79601 Pain in right arm: Secondary | ICD-10-CM | POA: Diagnosis not present

## 2016-06-30 DIAGNOSIS — M25511 Pain in right shoulder: Secondary | ICD-10-CM | POA: Diagnosis not present

## 2016-07-06 DIAGNOSIS — E876 Hypokalemia: Secondary | ICD-10-CM | POA: Diagnosis not present

## 2016-07-06 DIAGNOSIS — Z8673 Personal history of transient ischemic attack (TIA), and cerebral infarction without residual deficits: Secondary | ICD-10-CM | POA: Diagnosis not present

## 2016-07-06 DIAGNOSIS — E870 Hyperosmolality and hypernatremia: Secondary | ICD-10-CM | POA: Diagnosis not present

## 2016-07-06 DIAGNOSIS — I10 Essential (primary) hypertension: Secondary | ICD-10-CM | POA: Diagnosis not present

## 2016-07-06 DIAGNOSIS — R062 Wheezing: Secondary | ICD-10-CM | POA: Diagnosis not present

## 2016-07-06 DIAGNOSIS — I480 Paroxysmal atrial fibrillation: Secondary | ICD-10-CM | POA: Diagnosis not present

## 2016-07-06 DIAGNOSIS — R0602 Shortness of breath: Secondary | ICD-10-CM | POA: Diagnosis not present

## 2016-07-06 DIAGNOSIS — M542 Cervicalgia: Secondary | ICD-10-CM | POA: Diagnosis not present

## 2016-07-06 DIAGNOSIS — Z9889 Other specified postprocedural states: Secondary | ICD-10-CM | POA: Diagnosis not present

## 2016-07-06 DIAGNOSIS — R079 Chest pain, unspecified: Secondary | ICD-10-CM | POA: Diagnosis not present

## 2016-07-06 DIAGNOSIS — Z96659 Presence of unspecified artificial knee joint: Secondary | ICD-10-CM | POA: Diagnosis not present

## 2016-07-06 DIAGNOSIS — M25512 Pain in left shoulder: Secondary | ICD-10-CM | POA: Diagnosis not present

## 2016-07-06 DIAGNOSIS — R0989 Other specified symptoms and signs involving the circulatory and respiratory systems: Secondary | ICD-10-CM | POA: Diagnosis not present

## 2016-07-06 DIAGNOSIS — Z87891 Personal history of nicotine dependence: Secondary | ICD-10-CM | POA: Diagnosis not present

## 2016-07-06 DIAGNOSIS — I739 Peripheral vascular disease, unspecified: Secondary | ICD-10-CM | POA: Diagnosis not present

## 2016-07-06 DIAGNOSIS — E871 Hypo-osmolality and hyponatremia: Secondary | ICD-10-CM | POA: Diagnosis not present

## 2016-07-06 DIAGNOSIS — R200 Anesthesia of skin: Secondary | ICD-10-CM | POA: Diagnosis not present

## 2016-07-06 DIAGNOSIS — R55 Syncope and collapse: Secondary | ICD-10-CM | POA: Diagnosis not present

## 2016-07-06 DIAGNOSIS — I951 Orthostatic hypotension: Secondary | ICD-10-CM | POA: Diagnosis not present

## 2016-07-06 DIAGNOSIS — E7800 Pure hypercholesterolemia, unspecified: Secondary | ICD-10-CM | POA: Diagnosis not present

## 2016-07-06 DIAGNOSIS — R002 Palpitations: Secondary | ICD-10-CM | POA: Diagnosis not present

## 2016-07-06 DIAGNOSIS — N289 Disorder of kidney and ureter, unspecified: Secondary | ICD-10-CM | POA: Diagnosis not present

## 2016-07-06 DIAGNOSIS — R0789 Other chest pain: Secondary | ICD-10-CM | POA: Diagnosis not present

## 2016-07-06 DIAGNOSIS — M25511 Pain in right shoulder: Secondary | ICD-10-CM | POA: Diagnosis not present

## 2016-07-06 DIAGNOSIS — R51 Headache: Secondary | ICD-10-CM | POA: Diagnosis not present

## 2016-07-07 DIAGNOSIS — M19012 Primary osteoarthritis, left shoulder: Secondary | ICD-10-CM | POA: Diagnosis not present

## 2016-07-07 DIAGNOSIS — E870 Hyperosmolality and hypernatremia: Secondary | ICD-10-CM | POA: Diagnosis not present

## 2016-07-07 DIAGNOSIS — I951 Orthostatic hypotension: Secondary | ICD-10-CM | POA: Diagnosis not present

## 2016-07-07 DIAGNOSIS — R0789 Other chest pain: Secondary | ICD-10-CM | POA: Diagnosis not present

## 2016-07-07 DIAGNOSIS — E876 Hypokalemia: Secondary | ICD-10-CM | POA: Diagnosis not present

## 2016-07-07 DIAGNOSIS — I480 Paroxysmal atrial fibrillation: Secondary | ICD-10-CM | POA: Diagnosis not present

## 2016-07-07 DIAGNOSIS — M19011 Primary osteoarthritis, right shoulder: Secondary | ICD-10-CM | POA: Diagnosis not present

## 2016-07-09 DIAGNOSIS — M25561 Pain in right knee: Secondary | ICD-10-CM | POA: Diagnosis not present

## 2016-07-09 DIAGNOSIS — M1711 Unilateral primary osteoarthritis, right knee: Secondary | ICD-10-CM | POA: Diagnosis not present

## 2016-07-09 DIAGNOSIS — M179 Osteoarthritis of knee, unspecified: Secondary | ICD-10-CM | POA: Diagnosis not present

## 2016-07-30 DIAGNOSIS — Z79891 Long term (current) use of opiate analgesic: Secondary | ICD-10-CM | POA: Diagnosis not present

## 2016-07-30 DIAGNOSIS — M25511 Pain in right shoulder: Secondary | ICD-10-CM | POA: Diagnosis not present

## 2016-07-30 DIAGNOSIS — M545 Low back pain: Secondary | ICD-10-CM | POA: Diagnosis not present

## 2016-07-30 DIAGNOSIS — G894 Chronic pain syndrome: Secondary | ICD-10-CM | POA: Diagnosis not present

## 2016-07-30 DIAGNOSIS — M25562 Pain in left knee: Secondary | ICD-10-CM | POA: Diagnosis not present

## 2016-07-30 DIAGNOSIS — M79601 Pain in right arm: Secondary | ICD-10-CM | POA: Diagnosis not present

## 2016-08-04 DIAGNOSIS — Z1231 Encounter for screening mammogram for malignant neoplasm of breast: Secondary | ICD-10-CM | POA: Diagnosis not present

## 2016-08-11 DIAGNOSIS — Z8249 Family history of ischemic heart disease and other diseases of the circulatory system: Secondary | ICD-10-CM | POA: Diagnosis not present

## 2016-08-11 DIAGNOSIS — Z888 Allergy status to other drugs, medicaments and biological substances status: Secondary | ICD-10-CM | POA: Diagnosis not present

## 2016-08-11 DIAGNOSIS — I739 Peripheral vascular disease, unspecified: Secondary | ICD-10-CM | POA: Diagnosis not present

## 2016-08-11 DIAGNOSIS — E7800 Pure hypercholesterolemia, unspecified: Secondary | ICD-10-CM | POA: Diagnosis not present

## 2016-08-11 DIAGNOSIS — Z886 Allergy status to analgesic agent status: Secondary | ICD-10-CM | POA: Diagnosis not present

## 2016-08-11 DIAGNOSIS — Z8673 Personal history of transient ischemic attack (TIA), and cerebral infarction without residual deficits: Secondary | ICD-10-CM | POA: Diagnosis not present

## 2016-08-11 DIAGNOSIS — Z87891 Personal history of nicotine dependence: Secondary | ICD-10-CM | POA: Diagnosis not present

## 2016-08-11 DIAGNOSIS — Z885 Allergy status to narcotic agent status: Secondary | ICD-10-CM | POA: Diagnosis not present

## 2016-08-11 DIAGNOSIS — Z86711 Personal history of pulmonary embolism: Secondary | ICD-10-CM | POA: Diagnosis not present

## 2016-08-11 DIAGNOSIS — I10 Essential (primary) hypertension: Secondary | ICD-10-CM | POA: Diagnosis not present

## 2016-08-11 DIAGNOSIS — R1084 Generalized abdominal pain: Secondary | ICD-10-CM | POA: Diagnosis not present

## 2016-08-11 DIAGNOSIS — R109 Unspecified abdominal pain: Secondary | ICD-10-CM | POA: Diagnosis not present

## 2016-08-11 DIAGNOSIS — K529 Noninfective gastroenteritis and colitis, unspecified: Secondary | ICD-10-CM | POA: Diagnosis not present

## 2016-08-11 DIAGNOSIS — R112 Nausea with vomiting, unspecified: Secondary | ICD-10-CM | POA: Diagnosis not present

## 2016-08-11 DIAGNOSIS — Z780 Asymptomatic menopausal state: Secondary | ICD-10-CM | POA: Diagnosis not present

## 2016-08-11 DIAGNOSIS — I4891 Unspecified atrial fibrillation: Secondary | ICD-10-CM | POA: Diagnosis not present

## 2016-08-31 DIAGNOSIS — M25511 Pain in right shoulder: Secondary | ICD-10-CM | POA: Diagnosis not present

## 2016-08-31 DIAGNOSIS — G894 Chronic pain syndrome: Secondary | ICD-10-CM | POA: Diagnosis not present

## 2016-08-31 DIAGNOSIS — M545 Low back pain: Secondary | ICD-10-CM | POA: Diagnosis not present

## 2016-08-31 DIAGNOSIS — M25562 Pain in left knee: Secondary | ICD-10-CM | POA: Diagnosis not present

## 2016-08-31 DIAGNOSIS — Z79891 Long term (current) use of opiate analgesic: Secondary | ICD-10-CM | POA: Diagnosis not present

## 2016-09-13 DIAGNOSIS — I480 Paroxysmal atrial fibrillation: Secondary | ICD-10-CM | POA: Diagnosis not present

## 2016-09-13 DIAGNOSIS — I119 Hypertensive heart disease without heart failure: Secondary | ICD-10-CM | POA: Diagnosis not present

## 2016-09-13 DIAGNOSIS — E7800 Pure hypercholesterolemia, unspecified: Secondary | ICD-10-CM | POA: Diagnosis not present

## 2016-09-13 DIAGNOSIS — I10 Essential (primary) hypertension: Secondary | ICD-10-CM | POA: Diagnosis not present

## 2016-09-28 DIAGNOSIS — Z79891 Long term (current) use of opiate analgesic: Secondary | ICD-10-CM | POA: Diagnosis not present

## 2016-09-28 DIAGNOSIS — M545 Low back pain: Secondary | ICD-10-CM | POA: Diagnosis not present

## 2016-09-28 DIAGNOSIS — M25562 Pain in left knee: Secondary | ICD-10-CM | POA: Diagnosis not present

## 2016-09-28 DIAGNOSIS — M25511 Pain in right shoulder: Secondary | ICD-10-CM | POA: Diagnosis not present

## 2016-09-28 DIAGNOSIS — G890 Central pain syndrome: Secondary | ICD-10-CM | POA: Diagnosis not present

## 2016-09-28 DIAGNOSIS — G894 Chronic pain syndrome: Secondary | ICD-10-CM | POA: Diagnosis not present

## 2016-10-13 DIAGNOSIS — E7800 Pure hypercholesterolemia, unspecified: Secondary | ICD-10-CM | POA: Diagnosis not present

## 2016-10-13 DIAGNOSIS — I480 Paroxysmal atrial fibrillation: Secondary | ICD-10-CM | POA: Diagnosis not present

## 2016-10-13 DIAGNOSIS — I10 Essential (primary) hypertension: Secondary | ICD-10-CM | POA: Diagnosis not present

## 2016-10-13 DIAGNOSIS — G459 Transient cerebral ischemic attack, unspecified: Secondary | ICD-10-CM | POA: Diagnosis not present

## 2016-10-15 DIAGNOSIS — M940 Chondrocostal junction syndrome [Tietze]: Secondary | ICD-10-CM | POA: Diagnosis not present

## 2016-10-15 DIAGNOSIS — R079 Chest pain, unspecified: Secondary | ICD-10-CM | POA: Diagnosis not present

## 2016-10-15 DIAGNOSIS — I480 Paroxysmal atrial fibrillation: Secondary | ICD-10-CM | POA: Diagnosis not present

## 2016-10-15 DIAGNOSIS — I10 Essential (primary) hypertension: Secondary | ICD-10-CM | POA: Diagnosis not present

## 2016-10-15 DIAGNOSIS — M545 Low back pain: Secondary | ICD-10-CM | POA: Diagnosis not present

## 2016-10-15 DIAGNOSIS — Z87891 Personal history of nicotine dependence: Secondary | ICD-10-CM | POA: Diagnosis not present

## 2016-10-15 DIAGNOSIS — Z7901 Long term (current) use of anticoagulants: Secondary | ICD-10-CM | POA: Diagnosis not present

## 2016-10-15 DIAGNOSIS — Z8673 Personal history of transient ischemic attack (TIA), and cerebral infarction without residual deficits: Secondary | ICD-10-CM | POA: Diagnosis not present

## 2016-10-15 DIAGNOSIS — D649 Anemia, unspecified: Secondary | ICD-10-CM | POA: Diagnosis not present

## 2016-10-15 DIAGNOSIS — K219 Gastro-esophageal reflux disease without esophagitis: Secondary | ICD-10-CM | POA: Diagnosis not present

## 2016-10-15 DIAGNOSIS — G894 Chronic pain syndrome: Secondary | ICD-10-CM | POA: Diagnosis not present

## 2016-10-15 DIAGNOSIS — R0602 Shortness of breath: Secondary | ICD-10-CM | POA: Diagnosis not present

## 2016-10-15 DIAGNOSIS — M549 Dorsalgia, unspecified: Secondary | ICD-10-CM | POA: Diagnosis not present

## 2016-10-15 DIAGNOSIS — R262 Difficulty in walking, not elsewhere classified: Secondary | ICD-10-CM | POA: Diagnosis not present

## 2016-10-15 DIAGNOSIS — Z79899 Other long term (current) drug therapy: Secondary | ICD-10-CM | POA: Diagnosis not present

## 2016-10-15 DIAGNOSIS — Z96659 Presence of unspecified artificial knee joint: Secondary | ICD-10-CM | POA: Diagnosis not present

## 2016-10-15 DIAGNOSIS — Z9889 Other specified postprocedural states: Secondary | ICD-10-CM | POA: Diagnosis not present

## 2016-10-15 DIAGNOSIS — I081 Rheumatic disorders of both mitral and tricuspid valves: Secondary | ICD-10-CM | POA: Diagnosis not present

## 2016-10-15 DIAGNOSIS — E7800 Pure hypercholesterolemia, unspecified: Secondary | ICD-10-CM | POA: Diagnosis not present

## 2016-10-15 DIAGNOSIS — R0789 Other chest pain: Secondary | ICD-10-CM | POA: Diagnosis not present

## 2016-10-15 DIAGNOSIS — I739 Peripheral vascular disease, unspecified: Secondary | ICD-10-CM | POA: Diagnosis not present

## 2016-10-16 DIAGNOSIS — I361 Nonrheumatic tricuspid (valve) insufficiency: Secondary | ICD-10-CM | POA: Diagnosis not present

## 2016-10-16 DIAGNOSIS — E7800 Pure hypercholesterolemia, unspecified: Secondary | ICD-10-CM | POA: Diagnosis not present

## 2016-10-16 DIAGNOSIS — I10 Essential (primary) hypertension: Secondary | ICD-10-CM | POA: Diagnosis not present

## 2016-10-16 DIAGNOSIS — D649 Anemia, unspecified: Secondary | ICD-10-CM | POA: Diagnosis not present

## 2016-10-16 DIAGNOSIS — R0789 Other chest pain: Secondary | ICD-10-CM | POA: Diagnosis not present

## 2016-10-16 DIAGNOSIS — I340 Nonrheumatic mitral (valve) insufficiency: Secondary | ICD-10-CM | POA: Diagnosis not present

## 2016-10-16 DIAGNOSIS — I480 Paroxysmal atrial fibrillation: Secondary | ICD-10-CM | POA: Diagnosis not present

## 2016-10-17 DIAGNOSIS — K219 Gastro-esophageal reflux disease without esophagitis: Secondary | ICD-10-CM | POA: Diagnosis not present

## 2016-10-17 DIAGNOSIS — Z7901 Long term (current) use of anticoagulants: Secondary | ICD-10-CM | POA: Diagnosis not present

## 2016-10-17 DIAGNOSIS — I10 Essential (primary) hypertension: Secondary | ICD-10-CM | POA: Diagnosis not present

## 2016-10-17 DIAGNOSIS — R0789 Other chest pain: Secondary | ICD-10-CM | POA: Diagnosis not present

## 2016-10-17 DIAGNOSIS — D649 Anemia, unspecified: Secondary | ICD-10-CM | POA: Diagnosis not present

## 2016-10-26 DIAGNOSIS — J9601 Acute respiratory failure with hypoxia: Secondary | ICD-10-CM | POA: Diagnosis not present

## 2016-10-26 DIAGNOSIS — R0600 Dyspnea, unspecified: Secondary | ICD-10-CM | POA: Diagnosis not present

## 2016-10-26 DIAGNOSIS — G894 Chronic pain syndrome: Secondary | ICD-10-CM | POA: Diagnosis not present

## 2016-10-26 DIAGNOSIS — R200 Anesthesia of skin: Secondary | ICD-10-CM | POA: Diagnosis not present

## 2016-10-26 DIAGNOSIS — M25551 Pain in right hip: Secondary | ICD-10-CM | POA: Diagnosis not present

## 2016-10-26 DIAGNOSIS — D649 Anemia, unspecified: Secondary | ICD-10-CM | POA: Diagnosis not present

## 2016-10-26 DIAGNOSIS — Z7901 Long term (current) use of anticoagulants: Secondary | ICD-10-CM | POA: Diagnosis not present

## 2016-10-26 DIAGNOSIS — I10 Essential (primary) hypertension: Secondary | ICD-10-CM | POA: Diagnosis not present

## 2016-10-26 DIAGNOSIS — J9602 Acute respiratory failure with hypercapnia: Secondary | ICD-10-CM | POA: Diagnosis not present

## 2016-10-26 DIAGNOSIS — Z8669 Personal history of other diseases of the nervous system and sense organs: Secondary | ICD-10-CM | POA: Diagnosis not present

## 2016-10-26 DIAGNOSIS — E785 Hyperlipidemia, unspecified: Secondary | ICD-10-CM | POA: Diagnosis not present

## 2016-10-26 DIAGNOSIS — M6282 Rhabdomyolysis: Secondary | ICD-10-CM | POA: Diagnosis not present

## 2016-10-26 DIAGNOSIS — R2689 Other abnormalities of gait and mobility: Secondary | ICD-10-CM | POA: Diagnosis not present

## 2016-10-26 DIAGNOSIS — Z86711 Personal history of pulmonary embolism: Secondary | ICD-10-CM | POA: Diagnosis not present

## 2016-10-26 DIAGNOSIS — E7800 Pure hypercholesterolemia, unspecified: Secondary | ICD-10-CM | POA: Diagnosis not present

## 2016-10-26 DIAGNOSIS — M1712 Unilateral primary osteoarthritis, left knee: Secondary | ICD-10-CM | POA: Diagnosis not present

## 2016-10-26 DIAGNOSIS — I739 Peripheral vascular disease, unspecified: Secondary | ICD-10-CM | POA: Diagnosis not present

## 2016-10-26 DIAGNOSIS — Z87891 Personal history of nicotine dependence: Secondary | ICD-10-CM | POA: Diagnosis not present

## 2016-10-26 DIAGNOSIS — Z780 Asymptomatic menopausal state: Secondary | ICD-10-CM | POA: Diagnosis not present

## 2016-10-26 DIAGNOSIS — G8191 Hemiplegia, unspecified affecting right dominant side: Secondary | ICD-10-CM | POA: Diagnosis not present

## 2016-10-26 DIAGNOSIS — J9600 Acute respiratory failure, unspecified whether with hypoxia or hypercapnia: Secondary | ICD-10-CM | POA: Diagnosis not present

## 2016-10-26 DIAGNOSIS — I6789 Other cerebrovascular disease: Secondary | ICD-10-CM | POA: Diagnosis not present

## 2016-10-26 DIAGNOSIS — N179 Acute kidney failure, unspecified: Secondary | ICD-10-CM | POA: Diagnosis not present

## 2016-10-26 DIAGNOSIS — Z79899 Other long term (current) drug therapy: Secondary | ICD-10-CM | POA: Diagnosis not present

## 2016-10-26 DIAGNOSIS — K219 Gastro-esophageal reflux disease without esophagitis: Secondary | ICD-10-CM | POA: Diagnosis not present

## 2016-10-26 DIAGNOSIS — E876 Hypokalemia: Secondary | ICD-10-CM | POA: Diagnosis not present

## 2016-10-26 DIAGNOSIS — R29818 Other symptoms and signs involving the nervous system: Secondary | ICD-10-CM | POA: Diagnosis not present

## 2016-10-26 DIAGNOSIS — Z8673 Personal history of transient ischemic attack (TIA), and cerebral infarction without residual deficits: Secondary | ICD-10-CM | POA: Diagnosis not present

## 2016-10-26 DIAGNOSIS — I480 Paroxysmal atrial fibrillation: Secondary | ICD-10-CM | POA: Diagnosis not present

## 2016-10-26 DIAGNOSIS — I639 Cerebral infarction, unspecified: Secondary | ICD-10-CM | POA: Diagnosis not present

## 2016-10-26 DIAGNOSIS — R5381 Other malaise: Secondary | ICD-10-CM | POA: Diagnosis not present

## 2016-10-26 DIAGNOSIS — R531 Weakness: Secondary | ICD-10-CM | POA: Diagnosis not present

## 2016-10-26 DIAGNOSIS — R9431 Abnormal electrocardiogram [ECG] [EKG]: Secondary | ICD-10-CM | POA: Diagnosis not present

## 2016-10-26 DIAGNOSIS — R0602 Shortness of breath: Secondary | ICD-10-CM | POA: Diagnosis not present

## 2016-10-26 DIAGNOSIS — Z7182 Exercise counseling: Secondary | ICD-10-CM | POA: Diagnosis not present

## 2016-10-26 DIAGNOSIS — G4733 Obstructive sleep apnea (adult) (pediatric): Secondary | ICD-10-CM | POA: Diagnosis not present

## 2016-11-01 DIAGNOSIS — Z7901 Long term (current) use of anticoagulants: Secondary | ICD-10-CM | POA: Diagnosis not present

## 2016-11-01 DIAGNOSIS — M6282 Rhabdomyolysis: Secondary | ICD-10-CM | POA: Diagnosis not present

## 2016-11-01 DIAGNOSIS — I10 Essential (primary) hypertension: Secondary | ICD-10-CM | POA: Diagnosis not present

## 2016-11-01 DIAGNOSIS — G894 Chronic pain syndrome: Secondary | ICD-10-CM | POA: Diagnosis not present

## 2016-11-01 DIAGNOSIS — G4733 Obstructive sleep apnea (adult) (pediatric): Secondary | ICD-10-CM | POA: Diagnosis not present

## 2016-11-01 DIAGNOSIS — G459 Transient cerebral ischemic attack, unspecified: Secondary | ICD-10-CM | POA: Diagnosis not present

## 2016-11-01 DIAGNOSIS — I480 Paroxysmal atrial fibrillation: Secondary | ICD-10-CM | POA: Diagnosis not present

## 2016-11-03 DIAGNOSIS — G894 Chronic pain syndrome: Secondary | ICD-10-CM | POA: Diagnosis not present

## 2016-11-03 DIAGNOSIS — M25562 Pain in left knee: Secondary | ICD-10-CM | POA: Diagnosis not present

## 2016-11-03 DIAGNOSIS — M25511 Pain in right shoulder: Secondary | ICD-10-CM | POA: Diagnosis not present

## 2016-11-03 DIAGNOSIS — Z79891 Long term (current) use of opiate analgesic: Secondary | ICD-10-CM | POA: Diagnosis not present

## 2016-11-03 DIAGNOSIS — G890 Central pain syndrome: Secondary | ICD-10-CM | POA: Diagnosis not present

## 2016-11-03 DIAGNOSIS — M545 Low back pain: Secondary | ICD-10-CM | POA: Diagnosis not present

## 2016-11-04 DIAGNOSIS — G894 Chronic pain syndrome: Secondary | ICD-10-CM | POA: Diagnosis not present

## 2016-11-04 DIAGNOSIS — G4733 Obstructive sleep apnea (adult) (pediatric): Secondary | ICD-10-CM | POA: Diagnosis not present

## 2016-11-04 DIAGNOSIS — I480 Paroxysmal atrial fibrillation: Secondary | ICD-10-CM | POA: Diagnosis not present

## 2016-11-04 DIAGNOSIS — Z7901 Long term (current) use of anticoagulants: Secondary | ICD-10-CM | POA: Diagnosis not present

## 2016-11-04 DIAGNOSIS — M6282 Rhabdomyolysis: Secondary | ICD-10-CM | POA: Diagnosis not present

## 2016-11-04 DIAGNOSIS — G459 Transient cerebral ischemic attack, unspecified: Secondary | ICD-10-CM | POA: Diagnosis not present

## 2016-11-04 DIAGNOSIS — I10 Essential (primary) hypertension: Secondary | ICD-10-CM | POA: Diagnosis not present

## 2016-11-09 DIAGNOSIS — G459 Transient cerebral ischemic attack, unspecified: Secondary | ICD-10-CM | POA: Diagnosis not present

## 2016-11-09 DIAGNOSIS — Z7901 Long term (current) use of anticoagulants: Secondary | ICD-10-CM | POA: Diagnosis not present

## 2016-11-09 DIAGNOSIS — G4733 Obstructive sleep apnea (adult) (pediatric): Secondary | ICD-10-CM | POA: Diagnosis not present

## 2016-11-09 DIAGNOSIS — I480 Paroxysmal atrial fibrillation: Secondary | ICD-10-CM | POA: Diagnosis not present

## 2016-11-09 DIAGNOSIS — G894 Chronic pain syndrome: Secondary | ICD-10-CM | POA: Diagnosis not present

## 2016-11-09 DIAGNOSIS — M6282 Rhabdomyolysis: Secondary | ICD-10-CM | POA: Diagnosis not present

## 2016-11-09 DIAGNOSIS — I10 Essential (primary) hypertension: Secondary | ICD-10-CM | POA: Diagnosis not present

## 2016-11-12 DIAGNOSIS — I10 Essential (primary) hypertension: Secondary | ICD-10-CM | POA: Diagnosis not present

## 2016-11-12 DIAGNOSIS — Z7901 Long term (current) use of anticoagulants: Secondary | ICD-10-CM | POA: Diagnosis not present

## 2016-11-12 DIAGNOSIS — G894 Chronic pain syndrome: Secondary | ICD-10-CM | POA: Diagnosis not present

## 2016-11-12 DIAGNOSIS — I480 Paroxysmal atrial fibrillation: Secondary | ICD-10-CM | POA: Diagnosis not present

## 2016-11-12 DIAGNOSIS — M6282 Rhabdomyolysis: Secondary | ICD-10-CM | POA: Diagnosis not present

## 2016-11-12 DIAGNOSIS — G4733 Obstructive sleep apnea (adult) (pediatric): Secondary | ICD-10-CM | POA: Diagnosis not present

## 2016-11-12 DIAGNOSIS — G459 Transient cerebral ischemic attack, unspecified: Secondary | ICD-10-CM | POA: Diagnosis not present

## 2016-11-16 DIAGNOSIS — N179 Acute kidney failure, unspecified: Secondary | ICD-10-CM | POA: Diagnosis not present

## 2016-11-16 DIAGNOSIS — J9602 Acute respiratory failure with hypercapnia: Secondary | ICD-10-CM | POA: Diagnosis not present

## 2016-11-16 DIAGNOSIS — I639 Cerebral infarction, unspecified: Secondary | ICD-10-CM | POA: Diagnosis not present

## 2016-11-19 DIAGNOSIS — R0683 Snoring: Secondary | ICD-10-CM | POA: Diagnosis not present

## 2016-11-19 DIAGNOSIS — G459 Transient cerebral ischemic attack, unspecified: Secondary | ICD-10-CM | POA: Diagnosis not present

## 2016-11-20 DIAGNOSIS — Z79899 Other long term (current) drug therapy: Secondary | ICD-10-CM | POA: Diagnosis not present

## 2016-11-20 DIAGNOSIS — Z8673 Personal history of transient ischemic attack (TIA), and cerebral infarction without residual deficits: Secondary | ICD-10-CM | POA: Diagnosis not present

## 2016-11-20 DIAGNOSIS — R112 Nausea with vomiting, unspecified: Secondary | ICD-10-CM | POA: Diagnosis not present

## 2016-11-20 DIAGNOSIS — R1110 Vomiting, unspecified: Secondary | ICD-10-CM | POA: Diagnosis not present

## 2016-11-20 DIAGNOSIS — R079 Chest pain, unspecified: Secondary | ICD-10-CM | POA: Diagnosis not present

## 2016-11-20 DIAGNOSIS — E7800 Pure hypercholesterolemia, unspecified: Secondary | ICD-10-CM | POA: Diagnosis not present

## 2016-11-20 DIAGNOSIS — I4891 Unspecified atrial fibrillation: Secondary | ICD-10-CM | POA: Diagnosis not present

## 2016-11-20 DIAGNOSIS — I739 Peripheral vascular disease, unspecified: Secondary | ICD-10-CM | POA: Diagnosis not present

## 2016-11-20 DIAGNOSIS — I10 Essential (primary) hypertension: Secondary | ICD-10-CM | POA: Diagnosis not present

## 2016-11-20 DIAGNOSIS — Z885 Allergy status to narcotic agent status: Secondary | ICD-10-CM | POA: Diagnosis not present

## 2016-11-20 DIAGNOSIS — R109 Unspecified abdominal pain: Secondary | ICD-10-CM | POA: Diagnosis not present

## 2016-11-20 DIAGNOSIS — R197 Diarrhea, unspecified: Secondary | ICD-10-CM | POA: Diagnosis not present

## 2016-11-20 DIAGNOSIS — Z886 Allergy status to analgesic agent status: Secondary | ICD-10-CM | POA: Diagnosis not present

## 2016-11-20 DIAGNOSIS — Z888 Allergy status to other drugs, medicaments and biological substances status: Secondary | ICD-10-CM | POA: Diagnosis not present

## 2016-11-20 DIAGNOSIS — Z87891 Personal history of nicotine dependence: Secondary | ICD-10-CM | POA: Diagnosis not present

## 2016-11-20 DIAGNOSIS — K529 Noninfective gastroenteritis and colitis, unspecified: Secondary | ICD-10-CM | POA: Diagnosis not present

## 2016-11-29 DIAGNOSIS — Z8673 Personal history of transient ischemic attack (TIA), and cerebral infarction without residual deficits: Secondary | ICD-10-CM | POA: Diagnosis not present

## 2016-11-29 DIAGNOSIS — I4891 Unspecified atrial fibrillation: Secondary | ICD-10-CM | POA: Diagnosis not present

## 2016-11-29 DIAGNOSIS — R0603 Acute respiratory distress: Secondary | ICD-10-CM | POA: Diagnosis not present

## 2016-11-29 DIAGNOSIS — R0602 Shortness of breath: Secondary | ICD-10-CM | POA: Diagnosis not present

## 2016-11-29 DIAGNOSIS — Z79899 Other long term (current) drug therapy: Secondary | ICD-10-CM | POA: Diagnosis not present

## 2016-11-29 DIAGNOSIS — Z87891 Personal history of nicotine dependence: Secondary | ICD-10-CM | POA: Diagnosis not present

## 2016-11-29 DIAGNOSIS — E7800 Pure hypercholesterolemia, unspecified: Secondary | ICD-10-CM | POA: Diagnosis not present

## 2016-11-29 DIAGNOSIS — I110 Hypertensive heart disease with heart failure: Secondary | ICD-10-CM | POA: Diagnosis not present

## 2016-11-29 DIAGNOSIS — Z86711 Personal history of pulmonary embolism: Secondary | ICD-10-CM | POA: Diagnosis not present

## 2016-11-29 DIAGNOSIS — Z7901 Long term (current) use of anticoagulants: Secondary | ICD-10-CM | POA: Diagnosis not present

## 2016-11-29 DIAGNOSIS — E785 Hyperlipidemia, unspecified: Secondary | ICD-10-CM | POA: Diagnosis not present

## 2016-11-29 DIAGNOSIS — J9601 Acute respiratory failure with hypoxia: Secondary | ICD-10-CM | POA: Diagnosis not present

## 2016-11-29 DIAGNOSIS — R079 Chest pain, unspecified: Secondary | ICD-10-CM | POA: Diagnosis not present

## 2016-11-29 DIAGNOSIS — I340 Nonrheumatic mitral (valve) insufficiency: Secondary | ICD-10-CM | POA: Diagnosis not present

## 2016-11-29 DIAGNOSIS — I5032 Chronic diastolic (congestive) heart failure: Secondary | ICD-10-CM | POA: Diagnosis not present

## 2016-11-29 DIAGNOSIS — I10 Essential (primary) hypertension: Secondary | ICD-10-CM | POA: Diagnosis not present

## 2016-11-29 DIAGNOSIS — I482 Chronic atrial fibrillation: Secondary | ICD-10-CM | POA: Diagnosis not present

## 2016-11-29 DIAGNOSIS — J189 Pneumonia, unspecified organism: Secondary | ICD-10-CM | POA: Diagnosis not present

## 2016-11-29 DIAGNOSIS — K219 Gastro-esophageal reflux disease without esophagitis: Secondary | ICD-10-CM | POA: Diagnosis not present

## 2016-11-29 DIAGNOSIS — J441 Chronic obstructive pulmonary disease with (acute) exacerbation: Secondary | ICD-10-CM | POA: Diagnosis not present

## 2016-11-29 DIAGNOSIS — I739 Peripheral vascular disease, unspecified: Secondary | ICD-10-CM | POA: Diagnosis not present

## 2016-11-29 DIAGNOSIS — J9801 Acute bronchospasm: Secondary | ICD-10-CM | POA: Diagnosis not present

## 2016-11-29 DIAGNOSIS — Z96659 Presence of unspecified artificial knee joint: Secondary | ICD-10-CM | POA: Diagnosis not present

## 2016-11-29 DIAGNOSIS — J440 Chronic obstructive pulmonary disease with acute lower respiratory infection: Secondary | ICD-10-CM | POA: Diagnosis not present

## 2016-11-29 DIAGNOSIS — R0902 Hypoxemia: Secondary | ICD-10-CM | POA: Diagnosis not present

## 2016-11-29 DIAGNOSIS — J9811 Atelectasis: Secondary | ICD-10-CM | POA: Diagnosis not present

## 2016-12-07 DIAGNOSIS — G894 Chronic pain syndrome: Secondary | ICD-10-CM | POA: Diagnosis not present

## 2016-12-07 DIAGNOSIS — M25561 Pain in right knee: Secondary | ICD-10-CM | POA: Diagnosis not present

## 2016-12-07 DIAGNOSIS — M25562 Pain in left knee: Secondary | ICD-10-CM | POA: Diagnosis not present

## 2016-12-07 DIAGNOSIS — M545 Low back pain: Secondary | ICD-10-CM | POA: Diagnosis not present

## 2016-12-07 DIAGNOSIS — Z79891 Long term (current) use of opiate analgesic: Secondary | ICD-10-CM | POA: Diagnosis not present

## 2016-12-07 DIAGNOSIS — M25512 Pain in left shoulder: Secondary | ICD-10-CM | POA: Diagnosis not present

## 2016-12-31 DIAGNOSIS — Z87820 Personal history of traumatic brain injury: Secondary | ICD-10-CM | POA: Diagnosis not present

## 2016-12-31 DIAGNOSIS — I10 Essential (primary) hypertension: Secondary | ICD-10-CM | POA: Diagnosis not present

## 2016-12-31 DIAGNOSIS — K219 Gastro-esophageal reflux disease without esophagitis: Secondary | ICD-10-CM | POA: Diagnosis not present

## 2016-12-31 DIAGNOSIS — I480 Paroxysmal atrial fibrillation: Secondary | ICD-10-CM | POA: Diagnosis not present

## 2016-12-31 DIAGNOSIS — I5030 Unspecified diastolic (congestive) heart failure: Secondary | ICD-10-CM | POA: Diagnosis not present

## 2016-12-31 DIAGNOSIS — Z79899 Other long term (current) drug therapy: Secondary | ICD-10-CM | POA: Diagnosis not present

## 2016-12-31 DIAGNOSIS — J9601 Acute respiratory failure with hypoxia: Secondary | ICD-10-CM | POA: Diagnosis not present

## 2016-12-31 DIAGNOSIS — R911 Solitary pulmonary nodule: Secondary | ICD-10-CM | POA: Diagnosis not present

## 2016-12-31 DIAGNOSIS — J440 Chronic obstructive pulmonary disease with acute lower respiratory infection: Secondary | ICD-10-CM | POA: Diagnosis not present

## 2016-12-31 DIAGNOSIS — R51 Headache: Secondary | ICD-10-CM | POA: Diagnosis not present

## 2016-12-31 DIAGNOSIS — Z780 Asymptomatic menopausal state: Secondary | ICD-10-CM | POA: Diagnosis not present

## 2016-12-31 DIAGNOSIS — I110 Hypertensive heart disease with heart failure: Secondary | ICD-10-CM | POA: Diagnosis not present

## 2016-12-31 DIAGNOSIS — Z86711 Personal history of pulmonary embolism: Secondary | ICD-10-CM | POA: Diagnosis not present

## 2016-12-31 DIAGNOSIS — Z7901 Long term (current) use of anticoagulants: Secondary | ICD-10-CM | POA: Diagnosis not present

## 2016-12-31 DIAGNOSIS — Z87891 Personal history of nicotine dependence: Secondary | ICD-10-CM | POA: Diagnosis not present

## 2016-12-31 DIAGNOSIS — E7800 Pure hypercholesterolemia, unspecified: Secondary | ICD-10-CM | POA: Diagnosis not present

## 2016-12-31 DIAGNOSIS — G4733 Obstructive sleep apnea (adult) (pediatric): Secondary | ICD-10-CM | POA: Diagnosis not present

## 2016-12-31 DIAGNOSIS — I517 Cardiomegaly: Secondary | ICD-10-CM | POA: Diagnosis not present

## 2016-12-31 DIAGNOSIS — J441 Chronic obstructive pulmonary disease with (acute) exacerbation: Secondary | ICD-10-CM | POA: Diagnosis not present

## 2016-12-31 DIAGNOSIS — G894 Chronic pain syndrome: Secondary | ICD-10-CM | POA: Diagnosis not present

## 2016-12-31 DIAGNOSIS — I739 Peripheral vascular disease, unspecified: Secondary | ICD-10-CM | POA: Diagnosis not present

## 2016-12-31 DIAGNOSIS — R0602 Shortness of breath: Secondary | ICD-10-CM | POA: Diagnosis not present

## 2016-12-31 DIAGNOSIS — R0789 Other chest pain: Secondary | ICD-10-CM | POA: Diagnosis not present

## 2016-12-31 DIAGNOSIS — R079 Chest pain, unspecified: Secondary | ICD-10-CM | POA: Diagnosis not present

## 2016-12-31 DIAGNOSIS — Z8673 Personal history of transient ischemic attack (TIA), and cerebral infarction without residual deficits: Secondary | ICD-10-CM | POA: Diagnosis not present

## 2016-12-31 DIAGNOSIS — R0902 Hypoxemia: Secondary | ICD-10-CM | POA: Diagnosis not present

## 2016-12-31 DIAGNOSIS — J9602 Acute respiratory failure with hypercapnia: Secondary | ICD-10-CM | POA: Diagnosis not present

## 2017-01-05 DIAGNOSIS — M5416 Radiculopathy, lumbar region: Secondary | ICD-10-CM | POA: Diagnosis not present

## 2017-01-05 DIAGNOSIS — M79601 Pain in right arm: Secondary | ICD-10-CM | POA: Diagnosis not present

## 2017-01-05 DIAGNOSIS — M25561 Pain in right knee: Secondary | ICD-10-CM | POA: Diagnosis not present

## 2017-01-05 DIAGNOSIS — G894 Chronic pain syndrome: Secondary | ICD-10-CM | POA: Diagnosis not present

## 2017-01-05 DIAGNOSIS — M25511 Pain in right shoulder: Secondary | ICD-10-CM | POA: Diagnosis not present

## 2017-01-07 DIAGNOSIS — I10 Essential (primary) hypertension: Secondary | ICD-10-CM | POA: Diagnosis not present

## 2017-01-07 DIAGNOSIS — I480 Paroxysmal atrial fibrillation: Secondary | ICD-10-CM | POA: Diagnosis not present

## 2017-01-07 DIAGNOSIS — Z7901 Long term (current) use of anticoagulants: Secondary | ICD-10-CM | POA: Diagnosis not present

## 2017-01-07 DIAGNOSIS — Z8673 Personal history of transient ischemic attack (TIA), and cerebral infarction without residual deficits: Secondary | ICD-10-CM | POA: Diagnosis not present

## 2017-01-07 DIAGNOSIS — G894 Chronic pain syndrome: Secondary | ICD-10-CM | POA: Diagnosis not present

## 2017-01-07 DIAGNOSIS — Z79891 Long term (current) use of opiate analgesic: Secondary | ICD-10-CM | POA: Diagnosis not present

## 2017-01-07 DIAGNOSIS — M6282 Rhabdomyolysis: Secondary | ICD-10-CM | POA: Diagnosis not present

## 2017-01-07 DIAGNOSIS — G4733 Obstructive sleep apnea (adult) (pediatric): Secondary | ICD-10-CM | POA: Diagnosis not present

## 2017-01-11 DIAGNOSIS — I10 Essential (primary) hypertension: Secondary | ICD-10-CM | POA: Diagnosis not present

## 2017-01-11 DIAGNOSIS — M6282 Rhabdomyolysis: Secondary | ICD-10-CM | POA: Diagnosis not present

## 2017-01-11 DIAGNOSIS — Z79891 Long term (current) use of opiate analgesic: Secondary | ICD-10-CM | POA: Diagnosis not present

## 2017-01-11 DIAGNOSIS — Z8673 Personal history of transient ischemic attack (TIA), and cerebral infarction without residual deficits: Secondary | ICD-10-CM | POA: Diagnosis not present

## 2017-01-11 DIAGNOSIS — I480 Paroxysmal atrial fibrillation: Secondary | ICD-10-CM | POA: Diagnosis not present

## 2017-01-11 DIAGNOSIS — G894 Chronic pain syndrome: Secondary | ICD-10-CM | POA: Diagnosis not present

## 2017-01-11 DIAGNOSIS — Z7901 Long term (current) use of anticoagulants: Secondary | ICD-10-CM | POA: Diagnosis not present

## 2017-01-11 DIAGNOSIS — G4733 Obstructive sleep apnea (adult) (pediatric): Secondary | ICD-10-CM | POA: Diagnosis not present

## 2017-01-13 DIAGNOSIS — I480 Paroxysmal atrial fibrillation: Secondary | ICD-10-CM | POA: Diagnosis not present

## 2017-01-13 DIAGNOSIS — Z79891 Long term (current) use of opiate analgesic: Secondary | ICD-10-CM | POA: Diagnosis not present

## 2017-01-13 DIAGNOSIS — G4733 Obstructive sleep apnea (adult) (pediatric): Secondary | ICD-10-CM | POA: Diagnosis not present

## 2017-01-13 DIAGNOSIS — I10 Essential (primary) hypertension: Secondary | ICD-10-CM | POA: Diagnosis not present

## 2017-01-13 DIAGNOSIS — M6282 Rhabdomyolysis: Secondary | ICD-10-CM | POA: Diagnosis not present

## 2017-01-13 DIAGNOSIS — G894 Chronic pain syndrome: Secondary | ICD-10-CM | POA: Diagnosis not present

## 2017-01-13 DIAGNOSIS — Z7901 Long term (current) use of anticoagulants: Secondary | ICD-10-CM | POA: Diagnosis not present

## 2017-01-13 DIAGNOSIS — Z8673 Personal history of transient ischemic attack (TIA), and cerebral infarction without residual deficits: Secondary | ICD-10-CM | POA: Diagnosis not present

## 2017-01-14 DIAGNOSIS — I480 Paroxysmal atrial fibrillation: Secondary | ICD-10-CM | POA: Diagnosis not present

## 2017-01-14 DIAGNOSIS — N183 Chronic kidney disease, stage 3 (moderate): Secondary | ICD-10-CM | POA: Diagnosis not present

## 2017-01-14 DIAGNOSIS — I10 Essential (primary) hypertension: Secondary | ICD-10-CM | POA: Diagnosis not present

## 2017-01-14 DIAGNOSIS — Z8673 Personal history of transient ischemic attack (TIA), and cerebral infarction without residual deficits: Secondary | ICD-10-CM | POA: Diagnosis not present

## 2017-01-14 DIAGNOSIS — Z7901 Long term (current) use of anticoagulants: Secondary | ICD-10-CM | POA: Diagnosis not present

## 2017-01-14 DIAGNOSIS — J9612 Chronic respiratory failure with hypercapnia: Secondary | ICD-10-CM | POA: Diagnosis not present

## 2017-01-14 DIAGNOSIS — M791 Myalgia: Secondary | ICD-10-CM | POA: Diagnosis not present

## 2017-01-14 DIAGNOSIS — I129 Hypertensive chronic kidney disease with stage 1 through stage 4 chronic kidney disease, or unspecified chronic kidney disease: Secondary | ICD-10-CM | POA: Diagnosis not present

## 2017-01-16 DIAGNOSIS — R0683 Snoring: Secondary | ICD-10-CM | POA: Diagnosis not present

## 2017-01-16 DIAGNOSIS — G459 Transient cerebral ischemic attack, unspecified: Secondary | ICD-10-CM | POA: Diagnosis not present

## 2017-01-16 DIAGNOSIS — G4733 Obstructive sleep apnea (adult) (pediatric): Secondary | ICD-10-CM | POA: Diagnosis not present

## 2017-01-18 DIAGNOSIS — J9612 Chronic respiratory failure with hypercapnia: Secondary | ICD-10-CM | POA: Diagnosis not present

## 2017-01-18 DIAGNOSIS — R5381 Other malaise: Secondary | ICD-10-CM | POA: Diagnosis not present

## 2017-01-18 DIAGNOSIS — J4520 Mild intermittent asthma, uncomplicated: Secondary | ICD-10-CM | POA: Diagnosis not present

## 2017-01-18 DIAGNOSIS — M1712 Unilateral primary osteoarthritis, left knee: Secondary | ICD-10-CM | POA: Diagnosis not present

## 2017-01-18 DIAGNOSIS — J411 Mucopurulent chronic bronchitis: Secondary | ICD-10-CM | POA: Diagnosis not present

## 2017-01-20 DIAGNOSIS — Z8673 Personal history of transient ischemic attack (TIA), and cerebral infarction without residual deficits: Secondary | ICD-10-CM | POA: Diagnosis not present

## 2017-01-20 DIAGNOSIS — M6282 Rhabdomyolysis: Secondary | ICD-10-CM | POA: Diagnosis not present

## 2017-01-20 DIAGNOSIS — I480 Paroxysmal atrial fibrillation: Secondary | ICD-10-CM | POA: Diagnosis not present

## 2017-01-20 DIAGNOSIS — G894 Chronic pain syndrome: Secondary | ICD-10-CM | POA: Diagnosis not present

## 2017-01-20 DIAGNOSIS — Z7901 Long term (current) use of anticoagulants: Secondary | ICD-10-CM | POA: Diagnosis not present

## 2017-01-20 DIAGNOSIS — G4733 Obstructive sleep apnea (adult) (pediatric): Secondary | ICD-10-CM | POA: Diagnosis not present

## 2017-01-20 DIAGNOSIS — I10 Essential (primary) hypertension: Secondary | ICD-10-CM | POA: Diagnosis not present

## 2017-01-20 DIAGNOSIS — Z79891 Long term (current) use of opiate analgesic: Secondary | ICD-10-CM | POA: Diagnosis not present

## 2017-01-31 DIAGNOSIS — I480 Paroxysmal atrial fibrillation: Secondary | ICD-10-CM | POA: Diagnosis not present

## 2017-01-31 DIAGNOSIS — Z8669 Personal history of other diseases of the nervous system and sense organs: Secondary | ICD-10-CM | POA: Diagnosis not present

## 2017-01-31 DIAGNOSIS — Z86711 Personal history of pulmonary embolism: Secondary | ICD-10-CM | POA: Diagnosis not present

## 2017-01-31 DIAGNOSIS — H1033 Unspecified acute conjunctivitis, bilateral: Secondary | ICD-10-CM | POA: Diagnosis not present

## 2017-01-31 DIAGNOSIS — H109 Unspecified conjunctivitis: Secondary | ICD-10-CM | POA: Diagnosis not present

## 2017-01-31 DIAGNOSIS — R0602 Shortness of breath: Secondary | ICD-10-CM | POA: Diagnosis not present

## 2017-01-31 DIAGNOSIS — M25511 Pain in right shoulder: Secondary | ICD-10-CM | POA: Diagnosis not present

## 2017-01-31 DIAGNOSIS — I129 Hypertensive chronic kidney disease with stage 1 through stage 4 chronic kidney disease, or unspecified chronic kidney disease: Secondary | ICD-10-CM | POA: Diagnosis not present

## 2017-01-31 DIAGNOSIS — E7800 Pure hypercholesterolemia, unspecified: Secondary | ICD-10-CM | POA: Diagnosis not present

## 2017-01-31 DIAGNOSIS — N184 Chronic kidney disease, stage 4 (severe): Secondary | ICD-10-CM | POA: Diagnosis not present

## 2017-01-31 DIAGNOSIS — E870 Hyperosmolality and hypernatremia: Secondary | ICD-10-CM | POA: Diagnosis not present

## 2017-01-31 DIAGNOSIS — E876 Hypokalemia: Secondary | ICD-10-CM | POA: Diagnosis not present

## 2017-01-31 DIAGNOSIS — G4733 Obstructive sleep apnea (adult) (pediatric): Secondary | ICD-10-CM | POA: Diagnosis not present

## 2017-01-31 DIAGNOSIS — D649 Anemia, unspecified: Secondary | ICD-10-CM | POA: Diagnosis not present

## 2017-01-31 DIAGNOSIS — E872 Acidosis: Secondary | ICD-10-CM | POA: Diagnosis not present

## 2017-01-31 DIAGNOSIS — I10 Essential (primary) hypertension: Secondary | ICD-10-CM | POA: Diagnosis not present

## 2017-01-31 DIAGNOSIS — M7062 Trochanteric bursitis, left hip: Secondary | ICD-10-CM | POA: Diagnosis not present

## 2017-01-31 DIAGNOSIS — M1990 Unspecified osteoarthritis, unspecified site: Secondary | ICD-10-CM | POA: Diagnosis not present

## 2017-01-31 DIAGNOSIS — R079 Chest pain, unspecified: Secondary | ICD-10-CM | POA: Diagnosis not present

## 2017-01-31 DIAGNOSIS — S39012A Strain of muscle, fascia and tendon of lower back, initial encounter: Secondary | ICD-10-CM | POA: Diagnosis not present

## 2017-01-31 DIAGNOSIS — Z87891 Personal history of nicotine dependence: Secondary | ICD-10-CM | POA: Diagnosis not present

## 2017-01-31 DIAGNOSIS — M6282 Rhabdomyolysis: Secondary | ICD-10-CM | POA: Diagnosis not present

## 2017-01-31 DIAGNOSIS — N179 Acute kidney failure, unspecified: Secondary | ICD-10-CM | POA: Diagnosis not present

## 2017-01-31 DIAGNOSIS — Z8673 Personal history of transient ischemic attack (TIA), and cerebral infarction without residual deficits: Secondary | ICD-10-CM | POA: Diagnosis not present

## 2017-01-31 DIAGNOSIS — R072 Precordial pain: Secondary | ICD-10-CM | POA: Diagnosis not present

## 2017-01-31 DIAGNOSIS — I739 Peripheral vascular disease, unspecified: Secondary | ICD-10-CM | POA: Diagnosis not present

## 2017-02-07 DIAGNOSIS — Z7901 Long term (current) use of anticoagulants: Secondary | ICD-10-CM | POA: Diagnosis not present

## 2017-02-07 DIAGNOSIS — I480 Paroxysmal atrial fibrillation: Secondary | ICD-10-CM | POA: Diagnosis not present

## 2017-02-07 DIAGNOSIS — Z79891 Long term (current) use of opiate analgesic: Secondary | ICD-10-CM | POA: Diagnosis not present

## 2017-02-07 DIAGNOSIS — G4733 Obstructive sleep apnea (adult) (pediatric): Secondary | ICD-10-CM | POA: Diagnosis not present

## 2017-02-07 DIAGNOSIS — I10 Essential (primary) hypertension: Secondary | ICD-10-CM | POA: Diagnosis not present

## 2017-02-07 DIAGNOSIS — M6282 Rhabdomyolysis: Secondary | ICD-10-CM | POA: Diagnosis not present

## 2017-02-07 DIAGNOSIS — Z8673 Personal history of transient ischemic attack (TIA), and cerebral infarction without residual deficits: Secondary | ICD-10-CM | POA: Diagnosis not present

## 2017-02-07 DIAGNOSIS — G894 Chronic pain syndrome: Secondary | ICD-10-CM | POA: Diagnosis not present

## 2017-02-08 DIAGNOSIS — M25562 Pain in left knee: Secondary | ICD-10-CM | POA: Diagnosis not present

## 2017-02-08 DIAGNOSIS — G894 Chronic pain syndrome: Secondary | ICD-10-CM | POA: Diagnosis not present

## 2017-02-08 DIAGNOSIS — M25511 Pain in right shoulder: Secondary | ICD-10-CM | POA: Diagnosis not present

## 2017-02-08 DIAGNOSIS — M545 Low back pain: Secondary | ICD-10-CM | POA: Diagnosis not present

## 2017-02-08 DIAGNOSIS — M25561 Pain in right knee: Secondary | ICD-10-CM | POA: Diagnosis not present

## 2017-02-11 DIAGNOSIS — M25511 Pain in right shoulder: Secondary | ICD-10-CM | POA: Diagnosis not present

## 2017-02-11 DIAGNOSIS — G8929 Other chronic pain: Secondary | ICD-10-CM | POA: Diagnosis not present

## 2017-02-14 DIAGNOSIS — J4520 Mild intermittent asthma, uncomplicated: Secondary | ICD-10-CM | POA: Diagnosis not present

## 2017-02-14 DIAGNOSIS — J9612 Chronic respiratory failure with hypercapnia: Secondary | ICD-10-CM | POA: Diagnosis not present

## 2017-02-14 DIAGNOSIS — M1712 Unilateral primary osteoarthritis, left knee: Secondary | ICD-10-CM | POA: Diagnosis not present

## 2017-02-14 DIAGNOSIS — G4733 Obstructive sleep apnea (adult) (pediatric): Secondary | ICD-10-CM | POA: Diagnosis not present

## 2017-02-14 DIAGNOSIS — J411 Mucopurulent chronic bronchitis: Secondary | ICD-10-CM | POA: Diagnosis not present

## 2017-02-15 DIAGNOSIS — N189 Chronic kidney disease, unspecified: Secondary | ICD-10-CM | POA: Diagnosis not present

## 2017-02-15 DIAGNOSIS — R1310 Dysphagia, unspecified: Secondary | ICD-10-CM | POA: Diagnosis not present

## 2017-02-15 DIAGNOSIS — Z09 Encounter for follow-up examination after completed treatment for conditions other than malignant neoplasm: Secondary | ICD-10-CM | POA: Diagnosis not present

## 2017-02-15 DIAGNOSIS — N179 Acute kidney failure, unspecified: Secondary | ICD-10-CM | POA: Diagnosis not present

## 2017-02-15 DIAGNOSIS — I129 Hypertensive chronic kidney disease with stage 1 through stage 4 chronic kidney disease, or unspecified chronic kidney disease: Secondary | ICD-10-CM | POA: Diagnosis not present

## 2017-02-15 DIAGNOSIS — I4891 Unspecified atrial fibrillation: Secondary | ICD-10-CM | POA: Diagnosis not present

## 2017-02-15 DIAGNOSIS — D649 Anemia, unspecified: Secondary | ICD-10-CM | POA: Diagnosis not present

## 2017-02-15 DIAGNOSIS — Z79899 Other long term (current) drug therapy: Secondary | ICD-10-CM | POA: Diagnosis not present

## 2017-02-15 DIAGNOSIS — J9612 Chronic respiratory failure with hypercapnia: Secondary | ICD-10-CM | POA: Diagnosis not present

## 2017-02-15 DIAGNOSIS — E870 Hyperosmolality and hypernatremia: Secondary | ICD-10-CM | POA: Diagnosis not present

## 2017-02-15 DIAGNOSIS — M79641 Pain in right hand: Secondary | ICD-10-CM | POA: Diagnosis not present

## 2017-02-15 DIAGNOSIS — M79642 Pain in left hand: Secondary | ICD-10-CM | POA: Diagnosis not present

## 2017-02-17 DIAGNOSIS — Z8673 Personal history of transient ischemic attack (TIA), and cerebral infarction without residual deficits: Secondary | ICD-10-CM | POA: Diagnosis not present

## 2017-02-17 DIAGNOSIS — I10 Essential (primary) hypertension: Secondary | ICD-10-CM | POA: Diagnosis not present

## 2017-02-17 DIAGNOSIS — I480 Paroxysmal atrial fibrillation: Secondary | ICD-10-CM | POA: Diagnosis not present

## 2017-02-17 DIAGNOSIS — R5381 Other malaise: Secondary | ICD-10-CM | POA: Diagnosis not present

## 2017-02-17 DIAGNOSIS — Z79891 Long term (current) use of opiate analgesic: Secondary | ICD-10-CM | POA: Diagnosis not present

## 2017-02-17 DIAGNOSIS — G894 Chronic pain syndrome: Secondary | ICD-10-CM | POA: Diagnosis not present

## 2017-02-17 DIAGNOSIS — G4733 Obstructive sleep apnea (adult) (pediatric): Secondary | ICD-10-CM | POA: Diagnosis not present

## 2017-02-17 DIAGNOSIS — J411 Mucopurulent chronic bronchitis: Secondary | ICD-10-CM | POA: Diagnosis not present

## 2017-02-17 DIAGNOSIS — M6282 Rhabdomyolysis: Secondary | ICD-10-CM | POA: Diagnosis not present

## 2017-02-17 DIAGNOSIS — Z7901 Long term (current) use of anticoagulants: Secondary | ICD-10-CM | POA: Diagnosis not present

## 2017-02-21 DIAGNOSIS — I480 Paroxysmal atrial fibrillation: Secondary | ICD-10-CM | POA: Diagnosis not present

## 2017-02-21 DIAGNOSIS — Z8673 Personal history of transient ischemic attack (TIA), and cerebral infarction without residual deficits: Secondary | ICD-10-CM | POA: Diagnosis not present

## 2017-02-21 DIAGNOSIS — G894 Chronic pain syndrome: Secondary | ICD-10-CM | POA: Diagnosis not present

## 2017-02-21 DIAGNOSIS — G4733 Obstructive sleep apnea (adult) (pediatric): Secondary | ICD-10-CM | POA: Diagnosis not present

## 2017-02-21 DIAGNOSIS — M6282 Rhabdomyolysis: Secondary | ICD-10-CM | POA: Diagnosis not present

## 2017-02-21 DIAGNOSIS — Z7901 Long term (current) use of anticoagulants: Secondary | ICD-10-CM | POA: Diagnosis not present

## 2017-02-21 DIAGNOSIS — I10 Essential (primary) hypertension: Secondary | ICD-10-CM | POA: Diagnosis not present

## 2017-02-21 DIAGNOSIS — Z79891 Long term (current) use of opiate analgesic: Secondary | ICD-10-CM | POA: Diagnosis not present

## 2017-02-25 DIAGNOSIS — I10 Essential (primary) hypertension: Secondary | ICD-10-CM | POA: Diagnosis not present

## 2017-02-25 DIAGNOSIS — R809 Proteinuria, unspecified: Secondary | ICD-10-CM | POA: Diagnosis not present

## 2017-02-25 DIAGNOSIS — N179 Acute kidney failure, unspecified: Secondary | ICD-10-CM | POA: Diagnosis not present

## 2017-02-25 DIAGNOSIS — R309 Painful micturition, unspecified: Secondary | ICD-10-CM | POA: Diagnosis not present

## 2017-02-25 DIAGNOSIS — G4733 Obstructive sleep apnea (adult) (pediatric): Secondary | ICD-10-CM | POA: Diagnosis not present

## 2021-08-02 ENCOUNTER — Other Ambulatory Visit: Payer: Self-pay

## 2021-08-02 ENCOUNTER — Emergency Department (HOSPITAL_COMMUNITY): Payer: Medicare HMO

## 2021-08-02 ENCOUNTER — Observation Stay (HOSPITAL_COMMUNITY): Payer: Medicare HMO

## 2021-08-02 ENCOUNTER — Inpatient Hospital Stay (HOSPITAL_COMMUNITY)
Admission: EM | Admit: 2021-08-02 | Discharge: 2021-08-12 | DRG: 264 | Disposition: A | Discharge status: Home Health | Payer: Medicare HMO | Attending: Internal Medicine | Admitting: Internal Medicine

## 2021-08-02 ENCOUNTER — Encounter (HOSPITAL_COMMUNITY): Payer: Self-pay | Admitting: Emergency Medicine

## 2021-08-02 DIAGNOSIS — Z7189 Other specified counseling: Secondary | ICD-10-CM

## 2021-08-02 DIAGNOSIS — I272 Pulmonary hypertension, unspecified: Secondary | ICD-10-CM | POA: Diagnosis present

## 2021-08-02 DIAGNOSIS — N186 End stage renal disease: Secondary | ICD-10-CM | POA: Diagnosis present

## 2021-08-02 DIAGNOSIS — Z0000 Encounter for general adult medical examination without abnormal findings: Secondary | ICD-10-CM

## 2021-08-02 DIAGNOSIS — R1013 Epigastric pain: Secondary | ICD-10-CM | POA: Diagnosis not present

## 2021-08-02 DIAGNOSIS — Z6822 Body mass index (BMI) 22.0-22.9, adult: Secondary | ICD-10-CM

## 2021-08-02 DIAGNOSIS — T82838A Hemorrhage of vascular prosthetic devices, implants and grafts, initial encounter: Secondary | ICD-10-CM | POA: Diagnosis not present

## 2021-08-02 DIAGNOSIS — Z8673 Personal history of transient ischemic attack (TIA), and cerebral infarction without residual deficits: Secondary | ICD-10-CM

## 2021-08-02 DIAGNOSIS — Z7901 Long term (current) use of anticoagulants: Secondary | ICD-10-CM

## 2021-08-02 DIAGNOSIS — Z8249 Family history of ischemic heart disease and other diseases of the circulatory system: Secondary | ICD-10-CM

## 2021-08-02 DIAGNOSIS — R197 Diarrhea, unspecified: Secondary | ICD-10-CM

## 2021-08-02 DIAGNOSIS — M549 Dorsalgia, unspecified: Secondary | ICD-10-CM | POA: Diagnosis not present

## 2021-08-02 DIAGNOSIS — I160 Hypertensive urgency: Secondary | ICD-10-CM | POA: Diagnosis not present

## 2021-08-02 DIAGNOSIS — R112 Nausea with vomiting, unspecified: Secondary | ICD-10-CM

## 2021-08-02 DIAGNOSIS — K319 Disease of stomach and duodenum, unspecified: Secondary | ICD-10-CM | POA: Diagnosis present

## 2021-08-02 DIAGNOSIS — Z803 Family history of malignant neoplasm of breast: Secondary | ICD-10-CM

## 2021-08-02 DIAGNOSIS — N2581 Secondary hyperparathyroidism of renal origin: Secondary | ICD-10-CM | POA: Diagnosis present

## 2021-08-02 DIAGNOSIS — R1310 Dysphagia, unspecified: Secondary | ICD-10-CM

## 2021-08-02 DIAGNOSIS — Z8616 Personal history of COVID-19: Secondary | ICD-10-CM

## 2021-08-02 DIAGNOSIS — I5084 End stage heart failure: Secondary | ICD-10-CM | POA: Diagnosis present

## 2021-08-02 DIAGNOSIS — I5042 Chronic combined systolic (congestive) and diastolic (congestive) heart failure: Secondary | ICD-10-CM | POA: Diagnosis present

## 2021-08-02 DIAGNOSIS — D691 Qualitative platelet defects: Secondary | ICD-10-CM | POA: Diagnosis present

## 2021-08-02 DIAGNOSIS — R64 Cachexia: Secondary | ICD-10-CM | POA: Diagnosis present

## 2021-08-02 DIAGNOSIS — Z886 Allergy status to analgesic agent status: Secondary | ICD-10-CM

## 2021-08-02 DIAGNOSIS — Z96652 Presence of left artificial knee joint: Secondary | ICD-10-CM | POA: Diagnosis present

## 2021-08-02 DIAGNOSIS — J45909 Unspecified asthma, uncomplicated: Secondary | ICD-10-CM | POA: Diagnosis present

## 2021-08-02 DIAGNOSIS — K2980 Duodenitis without bleeding: Secondary | ICD-10-CM | POA: Diagnosis present

## 2021-08-02 DIAGNOSIS — M1990 Unspecified osteoarthritis, unspecified site: Secondary | ICD-10-CM | POA: Diagnosis present

## 2021-08-02 DIAGNOSIS — G8929 Other chronic pain: Secondary | ICD-10-CM | POA: Diagnosis present

## 2021-08-02 DIAGNOSIS — F1490 Cocaine use, unspecified, uncomplicated: Secondary | ICD-10-CM | POA: Diagnosis present

## 2021-08-02 DIAGNOSIS — R1010 Upper abdominal pain, unspecified: Secondary | ICD-10-CM | POA: Diagnosis not present

## 2021-08-02 DIAGNOSIS — E785 Hyperlipidemia, unspecified: Secondary | ICD-10-CM | POA: Diagnosis present

## 2021-08-02 DIAGNOSIS — Z87441 Personal history of nephrotic syndrome: Secondary | ICD-10-CM

## 2021-08-02 DIAGNOSIS — Z9114 Patient's other noncompliance with medication regimen: Secondary | ICD-10-CM

## 2021-08-02 DIAGNOSIS — Z23 Encounter for immunization: Secondary | ICD-10-CM

## 2021-08-02 DIAGNOSIS — Z888 Allergy status to other drugs, medicaments and biological substances status: Secondary | ICD-10-CM

## 2021-08-02 DIAGNOSIS — M79602 Pain in left arm: Secondary | ICD-10-CM | POA: Diagnosis not present

## 2021-08-02 DIAGNOSIS — K224 Dyskinesia of esophagus: Secondary | ICD-10-CM | POA: Diagnosis present

## 2021-08-02 DIAGNOSIS — E440 Moderate protein-calorie malnutrition: Secondary | ICD-10-CM | POA: Insufficient documentation

## 2021-08-02 DIAGNOSIS — I3139 Other pericardial effusion (noninflammatory): Secondary | ICD-10-CM | POA: Diagnosis present

## 2021-08-02 DIAGNOSIS — K2950 Unspecified chronic gastritis without bleeding: Secondary | ICD-10-CM | POA: Diagnosis present

## 2021-08-02 DIAGNOSIS — Z87891 Personal history of nicotine dependence: Secondary | ICD-10-CM

## 2021-08-02 DIAGNOSIS — R109 Unspecified abdominal pain: Secondary | ICD-10-CM

## 2021-08-02 DIAGNOSIS — Z90711 Acquired absence of uterus with remaining cervical stump: Secondary | ICD-10-CM

## 2021-08-02 DIAGNOSIS — Z79899 Other long term (current) drug therapy: Secondary | ICD-10-CM

## 2021-08-02 DIAGNOSIS — N269 Renal sclerosis, unspecified: Secondary | ICD-10-CM | POA: Diagnosis present

## 2021-08-02 DIAGNOSIS — Z20822 Contact with and (suspected) exposure to covid-19: Secondary | ICD-10-CM | POA: Diagnosis present

## 2021-08-02 DIAGNOSIS — D631 Anemia in chronic kidney disease: Secondary | ICD-10-CM | POA: Diagnosis present

## 2021-08-02 DIAGNOSIS — K2100 Gastro-esophageal reflux disease with esophagitis, without bleeding: Secondary | ICD-10-CM | POA: Diagnosis present

## 2021-08-02 DIAGNOSIS — K209 Esophagitis, unspecified without bleeding: Secondary | ICD-10-CM

## 2021-08-02 DIAGNOSIS — I480 Paroxysmal atrial fibrillation: Secondary | ICD-10-CM | POA: Diagnosis present

## 2021-08-02 DIAGNOSIS — F419 Anxiety disorder, unspecified: Secondary | ICD-10-CM | POA: Diagnosis present

## 2021-08-02 DIAGNOSIS — Z91199 Patient's noncompliance with other medical treatment and regimen due to unspecified reason: Secondary | ICD-10-CM

## 2021-08-02 DIAGNOSIS — K3189 Other diseases of stomach and duodenum: Secondary | ICD-10-CM | POA: Diagnosis present

## 2021-08-02 DIAGNOSIS — Z83438 Family history of other disorder of lipoprotein metabolism and other lipidemia: Secondary | ICD-10-CM

## 2021-08-02 DIAGNOSIS — I132 Hypertensive heart and chronic kidney disease with heart failure and with stage 5 chronic kidney disease, or end stage renal disease: Secondary | ICD-10-CM | POA: Diagnosis present

## 2021-08-02 DIAGNOSIS — Z885 Allergy status to narcotic agent status: Secondary | ICD-10-CM

## 2021-08-02 DIAGNOSIS — Z833 Family history of diabetes mellitus: Secondary | ICD-10-CM

## 2021-08-02 LAB — BRAIN NATRIURETIC PEPTIDE: B Natriuretic Peptide: 2292.3 pg/mL — ABNORMAL HIGH (ref 0.0–100.0)

## 2021-08-02 LAB — RESPIRATORY PANEL BY PCR

## 2021-08-02 LAB — URINALYSIS, ROUTINE W REFLEX MICROSCOPIC
Bilirubin Urine: NEGATIVE
Glucose, UA: NEGATIVE mg/dL
Ketones, ur: NEGATIVE mg/dL
Nitrite: NEGATIVE
Protein, ur: 100 mg/dL — AB
Specific Gravity, Urine: 1.011 (ref 1.005–1.030)
pH: 6 (ref 5.0–8.0)

## 2021-08-02 LAB — LIPID PANEL
Cholesterol: 134 mg/dL (ref 0–200)
HDL: 49 mg/dL (ref >40)
LDL Cholesterol: 75 mg/dL (ref 0–99)
Total CHOL/HDL Ratio: 2.7 RATIO
Triglycerides: 49 mg/dL (ref <150)
VLDL: 10 mg/dL (ref 0–40)

## 2021-08-02 LAB — RESP PANEL BY RT-PCR (FLU A&B, COVID) ARPGX2
Influenza A by PCR: NEGATIVE
Influenza B by PCR: NEGATIVE
SARS Coronavirus 2 by RT PCR: NEGATIVE

## 2021-08-02 LAB — BASIC METABOLIC PANEL
Anion gap: 11 (ref 5–15)
BUN: 62 mg/dL — ABNORMAL HIGH (ref 8–23)
CO2: 20 mmol/L — ABNORMAL LOW (ref 22–32)
Calcium: 8.4 mg/dL — ABNORMAL LOW (ref 8.9–10.3)
Chloride: 108 mmol/L (ref 98–111)
Creatinine, Ser: 5.6 mg/dL — ABNORMAL HIGH (ref 0.44–1.00)
GFR, Estimated: 8 mL/min — ABNORMAL LOW (ref >60)
Glucose, Bld: 100 mg/dL — ABNORMAL HIGH (ref 70–99)
Potassium: 4.3 mmol/L (ref 3.5–5.1)
Sodium: 139 mmol/L (ref 135–145)

## 2021-08-02 LAB — TROPONIN I (HIGH SENSITIVITY)
Troponin I (High Sensitivity): 271 ng/L (ref <18)
Troponin I (High Sensitivity): 225 ng/L (ref <18)

## 2021-08-02 LAB — HEPATIC FUNCTION PANEL
ALT: 28 U/L (ref 0–44)
AST: 32 U/L (ref 15–41)
Albumin: 3.1 g/dL — ABNORMAL LOW (ref 3.5–5.0)
Alkaline Phosphatase: 64 U/L (ref 38–126)
Bilirubin, Direct: <0.1 mg/dL (ref 0.0–0.2)
Total Bilirubin: 0.7 mg/dL (ref 0.3–1.2)
Total Protein: 5.8 g/dL — ABNORMAL LOW (ref 6.5–8.1)

## 2021-08-02 LAB — LIPASE, BLOOD: Lipase: 136 U/L — ABNORMAL HIGH (ref 11–51)

## 2021-08-02 LAB — CBC
HCT: 25 % — ABNORMAL LOW (ref 36.0–46.0)
Hemoglobin: 8 g/dL — ABNORMAL LOW (ref 12.0–15.0)
MCH: 27.4 pg (ref 26.0–34.0)
MCHC: 32 g/dL (ref 30.0–36.0)
MCV: 85.6 fL (ref 80.0–100.0)
Platelets: 226 10*3/uL (ref 150–400)
RBC: 2.92 MIL/uL — ABNORMAL LOW (ref 3.87–5.11)
RDW: 13.2 % (ref 11.5–15.5)
WBC: 5.5 10*3/uL (ref 4.0–10.5)
nRBC: 0 % (ref 0.0–0.2)

## 2021-08-02 LAB — MAGNESIUM: Magnesium: 1.8 mg/dL (ref 1.7–2.4)

## 2021-08-02 LAB — RAPID URINE DRUG SCREEN, HOSP PERFORMED
Amphetamines: NOT DETECTED
Barbiturates: NOT DETECTED
Benzodiazepines: NOT DETECTED
Cocaine: POSITIVE — AB
Opiates: NOT DETECTED
Tetrahydrocannabinol: NOT DETECTED

## 2021-08-02 LAB — PHOSPHORUS: Phosphorus: 4.4 mg/dL (ref 2.5–4.6)

## 2021-08-02 LAB — HIV ANTIBODY (ROUTINE TESTING W REFLEX): HIV Screen 4th Generation wRfx: NONREACTIVE

## 2021-08-02 MED ORDER — HYDRALAZINE HCL 25 MG PO TABS
100.0000 mg | ORAL_TABLET | Freq: Three times a day (TID) | ORAL | Status: DC
  Administered 2021-08-02: 100 mg via ORAL
  Filled 2021-08-02: qty 4

## 2021-08-02 MED ORDER — ALUM & MAG HYDROXIDE-SIMETH 200-200-20 MG/5ML PO SUSP
30.0000 mL | Freq: Once | ORAL | Status: AC
  Administered 2021-08-03: 30 mL via ORAL
  Filled 2021-08-02: qty 30

## 2021-08-02 MED ORDER — FUROSEMIDE 10 MG/ML IJ SOLN
40.0000 mg | Freq: Once | INTRAMUSCULAR | Status: AC
  Administered 2021-08-02: 40 mg via INTRAVENOUS
  Filled 2021-08-02: qty 4

## 2021-08-02 MED ORDER — FAMOTIDINE IN NACL 20-0.9 MG/50ML-% IV SOLN
20.0000 mg | Freq: Once | INTRAVENOUS | Status: AC
  Administered 2021-08-02: 20 mg via INTRAVENOUS
  Filled 2021-08-02: qty 50

## 2021-08-02 MED ORDER — DILTIAZEM HCL 60 MG PO TABS
60.0000 mg | ORAL_TABLET | Freq: Three times a day (TID) | ORAL | Status: DC
  Administered 2021-08-02: 60 mg via ORAL
  Filled 2021-08-02 (×3): qty 1

## 2021-08-02 MED ORDER — TRIMETHOBENZAMIDE HCL 300 MG PO CAPS: 300.0000 mg | ORAL_CAPSULE | Freq: Three times a day (TID) | ORAL | Status: DC | PRN

## 2021-08-02 MED ORDER — LIDOCAINE VISCOUS HCL 2 % MT SOLN
15.0000 mL | Freq: Once | OROMUCOSAL | Status: AC
  Administered 2021-08-03: 15 mL via ORAL
  Filled 2021-08-02: qty 15

## 2021-08-02 MED ORDER — ATORVASTATIN CALCIUM 10 MG PO TABS
20.0000 mg | ORAL_TABLET | Freq: Every day | ORAL | Status: DC
  Administered 2021-08-02 – 2021-08-12 (×11): 20 mg via ORAL
  Filled 2021-08-02 (×11): qty 2

## 2021-08-02 MED ORDER — CLONIDINE HCL 0.2 MG PO TABS
0.2000 mg | ORAL_TABLET | Freq: Two times a day (BID) | ORAL | Status: DC
  Administered 2021-08-02: 0.2 mg via ORAL
  Filled 2021-08-02: qty 1

## 2021-08-02 MED ORDER — ALUM & MAG HYDROXIDE-SIMETH 200-200-20 MG/5ML PO SUSP
30.0000 mL | Freq: Once | ORAL | Status: AC
  Administered 2021-08-02: 30 mL via ORAL
  Filled 2021-08-02: qty 30

## 2021-08-02 MED ORDER — LIDOCAINE VISCOUS HCL 2 % MT SOLN
15.0000 mL | Freq: Once | OROMUCOSAL | Status: AC
  Administered 2021-08-02: 15 mL via ORAL
  Filled 2021-08-02: qty 15

## 2021-08-02 MED ORDER — ACETAMINOPHEN 325 MG PO TABS
650.0000 mg | ORAL_TABLET | Freq: Four times a day (QID) | ORAL | Status: DC | PRN
  Filled 2021-08-02: qty 2
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
## 2021-08-02 NOTE — ED Triage Notes (Signed)
PT to triage via [facility] from home.  Reports coughing up blood, nausea, generalized weakness, and chest pain x 3-4 days.  Out of BP medication x 2 days.    EMS- 20g LAC Zofran 4mg 

## 2021-08-02 NOTE — ED Notes (Signed)
Patient transported to CT 

## 2021-08-02 NOTE — ED Provider Notes (Signed)
Emergency Medicine Provider Triage Evaluation Note  Audrey Hill , a 65 y.o. female  was evaluated in triage.  Pt complains of central chest pain, with some radiation to neck since this morning. Stomach pain for a week, with some nausea, no vomiting. Does endorse SOB. Reports she has been coughing up blood occasionally. No recent travel, leg pain. Supposed to be taking warfarin 2/2 prior TIA, has not taken this or any medications in months.  Review of Systems  Positive: Chest pain, SOB, coughing up blood Negative: Vomiting, dysuria  Physical Exam  BP (!) 182/113 (BP Location: Left Arm)   Pulse 87   Temp 98.5 F (36.9 C)   Resp 15   SpO2 100%  Gen:   Awake, somewhat ill appearing, no acute distress Resp:  Some pain with inspiration, appropriate effort MSK:   Moves extremities without difficulty  Other:  TTP abdomen generally, no focal TTP. No rebound rigidity, guarding, no signs of DVT bilateral LE  Medical Decision Making  Medically screening exam initiated at 11:55 AM.  Appropriate orders placed.  TOMMY MINICHIELLO was informed that the remainder of the evaluation will be completed by another provider, this initial triage assessment does not replace that evaluation, and the importance of remaining in the ED until their evaluation is complete.  Chest pain, SOB, coughing up blood   Anselmo Pickler, PA-C 08/02/21 1158    Tegeler, Gwenyth Allegra, MD 08/02/21 1401
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
## 2021-08-03 ENCOUNTER — Observation Stay (HOSPITAL_BASED_OUTPATIENT_CLINIC_OR_DEPARTMENT_OTHER): Payer: Medicare HMO

## 2021-08-03 DIAGNOSIS — I272 Pulmonary hypertension, unspecified: Secondary | ICD-10-CM | POA: Diagnosis present

## 2021-08-03 DIAGNOSIS — D631 Anemia in chronic kidney disease: Secondary | ICD-10-CM | POA: Diagnosis present

## 2021-08-03 DIAGNOSIS — R079 Chest pain, unspecified: Secondary | ICD-10-CM

## 2021-08-03 DIAGNOSIS — I5042 Chronic combined systolic (congestive) and diastolic (congestive) heart failure: Secondary | ICD-10-CM | POA: Diagnosis present

## 2021-08-03 DIAGNOSIS — K209 Esophagitis, unspecified without bleeding: Secondary | ICD-10-CM | POA: Diagnosis not present

## 2021-08-03 DIAGNOSIS — T82838A Hemorrhage of vascular prosthetic devices, implants and grafts, initial encounter: Secondary | ICD-10-CM | POA: Diagnosis not present

## 2021-08-03 DIAGNOSIS — R1013 Epigastric pain: Secondary | ICD-10-CM | POA: Diagnosis present

## 2021-08-03 DIAGNOSIS — K2950 Unspecified chronic gastritis without bleeding: Secondary | ICD-10-CM | POA: Diagnosis present

## 2021-08-03 DIAGNOSIS — R1310 Dysphagia, unspecified: Secondary | ICD-10-CM | POA: Diagnosis not present

## 2021-08-03 DIAGNOSIS — N186 End stage renal disease: Secondary | ICD-10-CM | POA: Diagnosis present

## 2021-08-03 DIAGNOSIS — I480 Paroxysmal atrial fibrillation: Secondary | ICD-10-CM | POA: Diagnosis present

## 2021-08-03 DIAGNOSIS — E440 Moderate protein-calorie malnutrition: Secondary | ICD-10-CM | POA: Diagnosis present

## 2021-08-03 DIAGNOSIS — Z20822 Contact with and (suspected) exposure to covid-19: Secondary | ICD-10-CM | POA: Diagnosis present

## 2021-08-03 DIAGNOSIS — Z7189 Other specified counseling: Secondary | ICD-10-CM | POA: Diagnosis not present

## 2021-08-03 DIAGNOSIS — Z888 Allergy status to other drugs, medicaments and biological substances status: Secondary | ICD-10-CM | POA: Diagnosis not present

## 2021-08-03 DIAGNOSIS — N2581 Secondary hyperparathyroidism of renal origin: Secondary | ICD-10-CM | POA: Diagnosis present

## 2021-08-03 DIAGNOSIS — Z95828 Presence of other vascular implants and grafts: Secondary | ICD-10-CM | POA: Diagnosis not present

## 2021-08-03 DIAGNOSIS — Z8673 Personal history of transient ischemic attack (TIA), and cerebral infarction without residual deficits: Secondary | ICD-10-CM | POA: Diagnosis not present

## 2021-08-03 DIAGNOSIS — Z23 Encounter for immunization: Secondary | ICD-10-CM | POA: Diagnosis present

## 2021-08-03 DIAGNOSIS — Z8616 Personal history of COVID-19: Secondary | ICD-10-CM | POA: Diagnosis not present

## 2021-08-03 DIAGNOSIS — E785 Hyperlipidemia, unspecified: Secondary | ICD-10-CM | POA: Diagnosis present

## 2021-08-03 DIAGNOSIS — K2970 Gastritis, unspecified, without bleeding: Secondary | ICD-10-CM | POA: Diagnosis not present

## 2021-08-03 DIAGNOSIS — I160 Hypertensive urgency: Secondary | ICD-10-CM | POA: Diagnosis present

## 2021-08-03 DIAGNOSIS — I3139 Other pericardial effusion (noninflammatory): Secondary | ICD-10-CM | POA: Diagnosis present

## 2021-08-03 DIAGNOSIS — D691 Qualitative platelet defects: Secondary | ICD-10-CM | POA: Diagnosis present

## 2021-08-03 DIAGNOSIS — N051 Unspecified nephritic syndrome with focal and segmental glomerular lesions: Secondary | ICD-10-CM | POA: Diagnosis not present

## 2021-08-03 DIAGNOSIS — J45909 Unspecified asthma, uncomplicated: Secondary | ICD-10-CM | POA: Diagnosis present

## 2021-08-03 DIAGNOSIS — Z885 Allergy status to narcotic agent status: Secondary | ICD-10-CM | POA: Diagnosis not present

## 2021-08-03 DIAGNOSIS — R64 Cachexia: Secondary | ICD-10-CM | POA: Diagnosis present

## 2021-08-03 DIAGNOSIS — I161 Hypertensive emergency: Secondary | ICD-10-CM | POA: Diagnosis not present

## 2021-08-03 DIAGNOSIS — I132 Hypertensive heart and chronic kidney disease with heart failure and with stage 5 chronic kidney disease, or end stage renal disease: Secondary | ICD-10-CM | POA: Diagnosis present

## 2021-08-03 DIAGNOSIS — Z9114 Patient's other noncompliance with medication regimen: Secondary | ICD-10-CM | POA: Diagnosis not present

## 2021-08-03 DIAGNOSIS — I5084 End stage heart failure: Secondary | ICD-10-CM | POA: Diagnosis present

## 2021-08-03 LAB — CBC WITH DIFFERENTIAL/PLATELET
Abs Immature Granulocytes: 0.01 10*3/uL (ref 0.00–0.07)
Basophils Absolute: 0 10*3/uL (ref 0.0–0.1)
Basophils Relative: 1 %
Eosinophils Absolute: 0.1 10*3/uL (ref 0.0–0.5)
Eosinophils Relative: 2 %
HCT: 22.2 % — ABNORMAL LOW (ref 36.0–46.0)
Hemoglobin: 7 g/dL — ABNORMAL LOW (ref 12.0–15.0)
Immature Granulocytes: 0 %
Lymphocytes Relative: 22 %
Lymphs Abs: 0.9 10*3/uL (ref 0.7–4.0)
MCH: 27.1 pg (ref 26.0–34.0)
MCHC: 31.5 g/dL (ref 30.0–36.0)
MCV: 86 fL (ref 80.0–100.0)
Monocytes Absolute: 0.3 10*3/uL (ref 0.1–1.0)
Monocytes Relative: 7 %
Neutro Abs: 2.8 10*3/uL (ref 1.7–7.7)
Neutrophils Relative %: 68 %
Platelets: 206 10*3/uL (ref 150–400)
RBC: 2.58 MIL/uL — ABNORMAL LOW (ref 3.87–5.11)
RDW: 13.4 % (ref 11.5–15.5)
WBC: 4.1 10*3/uL (ref 4.0–10.5)
nRBC: 0 % (ref 0.0–0.2)

## 2021-08-03 LAB — BASIC METABOLIC PANEL
Anion gap: 7 (ref 5–15)
BUN: 61 mg/dL — ABNORMAL HIGH (ref 8–23)
CO2: 22 mmol/L (ref 22–32)
Calcium: 8 mg/dL — ABNORMAL LOW (ref 8.9–10.3)
Chloride: 110 mmol/L (ref 98–111)
Creatinine, Ser: 5.52 mg/dL — ABNORMAL HIGH (ref 0.44–1.00)
GFR, Estimated: 8 mL/min — ABNORMAL LOW (ref >60)
Glucose, Bld: 75 mg/dL (ref 70–99)
Potassium: 4.6 mmol/L (ref 3.5–5.1)
Sodium: 139 mmol/L (ref 135–145)

## 2021-08-03 LAB — CBC
HCT: 24.1 % — ABNORMAL LOW (ref 36.0–46.0)
Hemoglobin: 7.8 g/dL — ABNORMAL LOW (ref 12.0–15.0)
MCH: 27.6 pg (ref 26.0–34.0)
MCHC: 32.4 g/dL (ref 30.0–36.0)
MCV: 85.2 fL (ref 80.0–100.0)
Platelets: 210 10*3/uL (ref 150–400)
RBC: 2.83 MIL/uL — ABNORMAL LOW (ref 3.87–5.11)
RDW: 13.4 % (ref 11.5–15.5)
WBC: 6.2 10*3/uL (ref 4.0–10.5)
nRBC: 0.3 % — ABNORMAL HIGH (ref 0.0–0.2)

## 2021-08-03 LAB — IRON AND TIBC
Iron: 36 ug/dL (ref 28–170)
Saturation Ratios: 16 % (ref 10.4–31.8)
TIBC: 228 ug/dL — ABNORMAL LOW (ref 250–450)
UIBC: 192 ug/dL

## 2021-08-03 LAB — ECHOCARDIOGRAM COMPLETE
AV Mean grad: 5 mmHg
AV Peak grad: 11.4 mmHg
Ao pk vel: 1.69 m/s
Area-P 1/2: 6.9 cm2
Calc EF: 31.9 %
S' Lateral: 4.4 cm
Single Plane A2C EF: 32.5 %
Single Plane A4C EF: 31.7 %
Weight: 2227.2 oz

## 2021-08-03 LAB — FERRITIN: Ferritin: 119 ng/mL (ref 11–307)

## 2021-08-03 LAB — OCCULT BLOOD X 1 CARD TO LAB, STOOL: Fecal Occult Bld: NEGATIVE

## 2021-08-03 LAB — HEMOGLOBIN A1C
Hgb A1c MFr Bld: 5.6 % (ref 4.8–5.6)
Mean Plasma Glucose: 114.02 mg/dL

## 2021-08-03 MED ORDER — CEPHALEXIN 250 MG PO CAPS
250.0000 mg | ORAL_CAPSULE | Freq: Two times a day (BID) | ORAL | Status: AC
  Administered 2021-08-03 – 2021-08-07 (×10): 250 mg via ORAL
  Filled 2021-08-03 (×10): qty 1

## 2021-08-03 MED ORDER — RAMELTEON 8 MG PO TABS
8.0000 mg | ORAL_TABLET | Freq: Every evening | ORAL | Status: DC | PRN
  Administered 2021-08-03 – 2021-08-12 (×8): 8 mg via ORAL
  Filled 2021-08-03 (×11): qty 1

## 2021-08-03 MED ORDER — PENTAFLUOROPROP-TETRAFLUOROETH EX AERO: 1.0000 "application " | INHALATION_SPRAY | CUTANEOUS | Status: DC | PRN

## 2021-08-03 MED ORDER — SODIUM CHLORIDE 0.9 % IV SOLN: 100.0000 mL | INTRAVENOUS | Status: DC | PRN

## 2021-08-03 MED ORDER — SODIUM CHLORIDE 0.9 % IV SOLN: INTRAVENOUS | Status: DC | PRN

## 2021-08-03 MED ORDER — PROCHLORPERAZINE EDISYLATE 10 MG/2ML IJ SOLN: 10.0000 mg | Freq: Once | INTRAMUSCULAR | Status: DC

## 2021-08-03 MED ORDER — LIDOCAINE VISCOUS HCL 2 % MT SOLN
15.0000 mL | Freq: Once | OROMUCOSAL | Status: AC
  Administered 2021-08-03: 15 mL via ORAL
  Filled 2021-08-03: qty 15

## 2021-08-03 MED ORDER — INFLUENZA VAC A&B SA ADJ QUAD 0.5 ML IM PRSY
0.5000 mL | PREFILLED_SYRINGE | INTRAMUSCULAR | Status: AC
  Administered 2021-08-12: 0.5 mL via INTRAMUSCULAR
  Filled 2021-08-03: qty 0.5

## 2021-08-03 MED ORDER — DILTIAZEM HCL 60 MG PO TABS: 60.0000 mg | ORAL_TABLET | Freq: Every day | ORAL | Status: DC

## 2021-08-03 MED ORDER — PNEUMOCOCCAL VAC POLYVALENT 25 MCG/0.5ML IJ INJ
0.5000 mL | INJECTION | INTRAMUSCULAR | Status: AC
Start: 2021-08-04 — End: 2021-08-12
  Administered 2021-08-12: 0.5 mL via INTRAMUSCULAR
  Filled 2021-08-03 (×2): qty 0.5

## 2021-08-03 MED ORDER — DIPHENHYDRAMINE HCL 50 MG/ML IJ SOLN
12.5000 mg | Freq: Once | INTRAMUSCULAR | Status: DC
  Filled 2021-08-03 (×2): qty 1

## 2021-08-03 MED ORDER — SUCRALFATE 1 GM/10ML PO SUSP
1.0000 g | Freq: Three times a day (TID) | ORAL | Status: DC
  Administered 2021-08-03 – 2021-08-11 (×29): 1 g via ORAL
  Filled 2021-08-03 (×31): qty 10

## 2021-08-03 MED ORDER — DILTIAZEM HCL ER COATED BEADS 120 MG PO CP24
120.0000 mg | ORAL_CAPSULE | Freq: Every day | ORAL | Status: DC
  Administered 2021-08-03 – 2021-08-04 (×2): 120 mg via ORAL
  Filled 2021-08-03 (×2): qty 1

## 2021-08-03 MED ORDER — LIDOCAINE-PRILOCAINE 2.5-2.5 % EX CREA
1.0000 "application " | TOPICAL_CREAM | CUTANEOUS | Status: DC | PRN
  Filled 2021-08-03: qty 5

## 2021-08-03 MED ORDER — ALTEPLASE 2 MG IJ SOLR
2.0000 mg | Freq: Once | INTRAMUSCULAR | Status: DC | PRN
  Filled 2021-08-03: qty 2

## 2021-08-03 MED ORDER — CEFAZOLIN SODIUM-DEXTROSE 2-4 GM/100ML-% IV SOLN
2.0000 g | INTRAVENOUS | Status: AC
  Filled 2021-08-03: qty 100

## 2021-08-03 MED ORDER — PROCHLORPERAZINE EDISYLATE 10 MG/2ML IJ SOLN
10.0000 mg | Freq: Once | INTRAMUSCULAR | Status: DC
  Filled 2021-08-03 (×2): qty 2

## 2021-08-03 MED ORDER — BENZONATATE 100 MG PO CAPS
100.0000 mg | ORAL_CAPSULE | Freq: Three times a day (TID) | ORAL | Status: DC | PRN
  Administered 2021-08-03 – 2021-08-04 (×3): 100 mg via ORAL
  Filled 2021-08-03 (×3): qty 1

## 2021-08-03 MED ORDER — PERFLUTREN LIPID MICROSPHERE
1.0000 mL | INTRAVENOUS | Status: AC | PRN
  Administered 2021-08-03: 2 mL via INTRAVENOUS
  Filled 2021-08-03: qty 10

## 2021-08-03 MED ORDER — PANTOPRAZOLE SODIUM 40 MG IV SOLR
40.0000 mg | INTRAVENOUS | Status: DC
  Filled 2021-08-03: qty 40

## 2021-08-03 MED ORDER — CHLORHEXIDINE GLUCONATE CLOTH 2 % EX PADS
6.0000 | MEDICATED_PAD | Freq: Every day | CUTANEOUS | Status: DC
  Administered 2021-08-04 – 2021-08-11 (×8): 6 via TOPICAL

## 2021-08-03 MED ORDER — ALUM & MAG HYDROXIDE-SIMETH 200-200-20 MG/5ML PO SUSP
30.0000 mL | Freq: Once | ORAL | Status: AC
  Administered 2021-08-03: 30 mL via ORAL
  Filled 2021-08-03: qty 30

## 2021-08-03 MED ORDER — PANTOPRAZOLE SODIUM 40 MG IV SOLR
40.0000 mg | Freq: Once | INTRAVENOUS | Status: AC
  Administered 2021-08-03: 40 mg via INTRAVENOUS
  Filled 2021-08-03: qty 40

## 2021-08-03 MED ORDER — LIDOCAINE HCL (PF) 1 % IJ SOLN: 5.0000 mL | INTRAMUSCULAR | Status: DC | PRN

## 2021-08-03 MED ORDER — HEPARIN SODIUM (PORCINE) 1000 UNIT/ML DIALYSIS
1000.0000 [IU] | INTRAMUSCULAR | Status: DC | PRN
  Filled 2021-08-03 (×4): qty 1

## 2021-08-03 NOTE — Progress Notes (Signed)
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 Stated her headache was bothering her the most. When asked how she thought she was doing, she stated she thought she was doing pretty good. When asked about her kidneys she stated they told me I need dialysis before.  She teared up when talking about losing her brother in August 2022 and stated she would like to speak with a chaplain when I offered.    Interval Update: After giving IV Compazine, and low dose benadryl, patient stated her headache had improved. Only thing that was bothering her was abdominal pain.   Objective:  Vital signs in last 24 hours: Vitals:   08/02/21 1830 08/02/21 1845 08/02/21 2000 08/03/21 0346  BP: 110/66 (!) 142/96 (!) 160/97 (!) 149/119  Pulse: 73 78 83 93  Resp: (!) 23 (!) 25 19 19   Temp:   97.9 F (36.6 C) 98.8 F (37.1 C)  TempSrc:   Oral Oral  SpO2: 92% 95% 94% 99%  Weight:   63.1 kg    Supplemental O2: Room Air SpO2: 99 %  Physical Exam General: NAD Head: Normocephalic without scalp lesions.  Neck: Neck supple with full range of motion (ROM). No masses or tenderness. Jugular venous distension (JVD) normal. Trachea midline.  Lungs: Rales presents throughout the lung. Appreciated 3/4 of the posterior lung fields.   Cardiovascular: tachycardic, regular rhythm.  Abdomen: TTP in upper quadrants.  MSK: No asymmetry or muscle atrophy. Full range of motion (ROM) of all joints. No LE edema Skin: warm, dry good skin turgor, no lesions Neuro: Alert and oriented. CN grossly intact Psych: Normal mood and normal affect   Filed Weights   08/02/21 2000  Weight: 63.1 kg     Intake/Output Summary (Last 24 hours) at 08/03/2021 3500 Last data  filed at 08/02/2021 2328 Gross per 24 hour  Intake 530 ml  Output 300 ml  Net 230 ml   Net IO Since Admission: 230 mL [08/03/21 0632]  CBC Latest Ref Rng & Units 08/03/2021 08/02/2021 11/03/2015  WBC 4.0 - 10.5 K/uL 4.1 5.5 3.7(L)  Hemoglobin 12.0 - 15.0 g/dL 7.0(L) 8.0(L) 12.3  Hematocrit 36.0 - 46.0 % 22.2(L) 25.0(L) 37.1  Platelets 150 - 400 K/uL 206 226 207    CMP Latest Ref Rng & Units 08/03/2021 08/02/2021 11/03/2015  Glucose 70 - 99 mg/dL 75 100(H) 101(H)  BUN 8 - 23 mg/dL 61(H) 62(H) 8  Creatinine 0.44 - 1.00 mg/dL 5.52(H) 5.60(H) 1.04(H)  Sodium 135 - 145 mmol/L 139 139 145  Potassium 3.5 - 5.1 mmol/L 4.6 4.3 3.4(L)  Chloride 98 - 111 mmol/L 110 108 110  CO2 22 - 32 mmol/L 22 20(L) 24  Calcium 8.9 - 10.3 mg/dL 8.0(L) 8.4(L) 9.1  Total Protein 6.5 - 8.1 g/dL - 5.8(L) 6.9  Total Bilirubin 0.3 - 1.2 mg/dL - 0.7 0.6  Alkaline Phos 38 - 126 U/L - 64 50  AST 15 - 41 U/L - 32 22  ALT 0 - 44 U/L - 28 16    Iron/TIBC/Ferritin/ %Sat 08/03/21    Component Value Date/Time   IRON 36 08/03/2021 1456   TIBC 228 (L) 08/03/2021 1456   FERRITIN 119 08/03/2021 1456   IRONPCTSAT 16 08/03/2021 1456  Images Echo 08/03/21 1. Left ventricular ejection fraction, by estimation, is 30 to 35%. The  left ventricle has moderately decreased function. The left ventricle  demonstrates global hypokinesis. The left ventricular internal cavity size  was mildly dilated. There is severe  concentric left ventricular hypertrophy. Left ventricular diastolic  parameters are consistent with Grade III diastolic dysfunction  (restrictive). Elevated left ventricular end-diastolic pressure. The  average left ventricular global longitudinal strain is   -8.1 %. The global longitudinal strain is abnormal.   2. Right ventricular systolic function is normal. The right ventricular  size is normal. Mildly increased right ventricular wall thickness. There  is severely elevated pulmonary artery systolic pressure.  The estimated  right ventricular systolic pressure  is 26.9 mmHg.   3. Left atrial size was severely dilated.   4. Right atrial size was severely dilated.   5. The pericardial effusion is circumferential.   6. The mitral valve is degenerative. Mild to moderate mitral valve  regurgitation. No evidence of mitral stenosis.   7. The aortic valve is tricuspid. Aortic valve regurgitation is not  visualized. Mild aortic valve sclerosis is present, with no evidence of  aortic valve stenosis. Aortic valve mean gradient measures 5.0 mmHg.  Aortic valve Vmax measures 1.69 m/s.   8. The inferior vena cava is dilated in size with <50% respiratory  variability, suggesting right atrial pressure of 15 mmHg.   9. 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 Consider PYP scan or  cMRI.   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    #Hypertensive Urgency #Chest Pain #Hemoptysis #CHF Patient BP 150s/80s. She states her chest pain is improved since admission. No episodes of blood in vomit or in stool reported by the patient. BNP was 2,292 on 08/02/21. Echo showed EF is 30 to 35 %. LV cavity is dilated. LV shows a Grade III diastolic dysfunction (restrictive). Severe elevated pulmonary artery systolic pressure. Severe biatrial enlargement. Presence of pericardial effusion.  See full impressions above. 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  -Restarted Cardizem extended release 120 mg -Will order Cardiac MRI since patient ready to undergo dialysis -Palliative consult pending -Strict I/O -Daily weights     #Abdominal Pain #Nausea,  Vomiting Patient endorsed abdominal pain that is different from her pain yesterday. Yesterday she stated she had a sharp pain in the abdomen but today she is stating it is a burning quality. She states the nausea and vomiting have resolved. She endorsed a hx of GERD but has not taken any treatment for this in one year. She endorsed hx of dysphagia. Most likely has esophagitis and gastritis 2/2 to uncontrolled GERD. Her hemoglobin dropped from 8 yesterday to 7 this morning concerning for blood loss. Currently HDS. Iron studies show iron and ferritin level wnl as shown above. The saturation is on the lower side at 16%. The pain could also be ischemic in nature secondary to her cocaine use. IV 40 mg PPI started along with Carafate. DRE performed with no gross blood but specimen is at the lab for analysis.  -F/u heme occult -CBC pending this evening since hgb drop -If less than 7 than transfuse  -Palliative consult  pending -Continue IV Protonix 40 mg and Carafate 1g TID and qhs   #Hypertension #CKD Stage V Patient has hx of hypertension with repeated episodes of hypertensive urgency. Her home meds include Cardizem 240 mg qd, Hydralazine 100 mg TID, and Clonidine 0.1 mg BID. She endorses not taking any of her meds in 2-3 weeks. 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 Patient has been offered dialysis as an option but has declined it. After speaking with nephrology, patient agreed to undergo dialysis.  -Restart BP meds slowly as patient received all of them in the ED at once causing her to be normotensive from significant hypertensive pressure. We will restart Cardizem 60 mg and up-titrate.  -BMP daily -Palliative care consult pending -Nephrology consult pending  -Patient agreed to HD, scheduled tentatively for tomorrow.    #Paroxysmal Atrial Fibrillation Currently in NSR. HR <100.  -Will hold Eliquis in setting of low hgb.   #Hx of CVA #Hyperlipidemia Patient has hx of HLD and  hx of CVA. Her lipid panel on 08/03/21 showed her cholesterol to be 134, LDL to be 75. Patient is on eliquis due to her previous stroke and hx of a fib. -Continue home statin -Hold eliquis for now in setting of low hgb.    Diet: Heart Healthy VTE: SCDs IVF: None,None Code: Full   Prior to Admission Living Arrangement: Home Anticipated Discharge Location: TBD Barriers to Discharge: Medical workup      Signed: Idamae Schuller, MD Tillie Rung. [facility] Internal Medicine Residency, PGY-1  After 5 pm and on weekends, please call the on call pager: [phone]. 08/03/2021, 3:29 PM
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
## 2021-08-03 NOTE — Progress Notes (Signed)
Pt stated multiple times she has headache. I paged the MD on call. Who put order for Tylenol. Pt  refused the Tylenol stating "I can't have Tylenol. I am allergic to Tylenol"

## 2021-08-03 NOTE — Care Management (Signed)
08-03-21 1341 Case Manager spoke with the patient regarding consult for PCP needs. Patient had been going to Dr. Odie Sera office. Case Manager discussed with the  patient that she can call her [facility] Provider to find a PCP in network to call to be assigned for primary care needs. Patient verbalized understanding. Case Manager will continue to follow for additional needs.

## 2021-08-03 NOTE — Progress Notes (Signed)
Heart Failure Navigator Progress Note  Assessed for Heart & Vascular TOC clinic readiness.  Patient does not meet criteria due to ESRD, HD this admission.   Navigator available for reassessment of patient.   Pricilla Holm, MSN, RN Heart Failure Nurse Navigator [phone]

## 2021-08-03 NOTE — Progress Notes (Addendum)
PROGRESS NOTE:  Paged by RN patient reporting headache, reporting day team had mentioned IV pain medication. Dr. Vinetta Bergamo and I assessed patient at bedside. She states the day team had mentioned pain medication regarding her headache. Mentions she cannot take Tylenol due to a previous rash and cannot take NSAID's due to her kidneys. Notes she used to take dilaudid, but has not had this medication "in awhile." Currently describing bilateral frontal headache without radiation of pain or any associated symptoms. This has been intermittent since arrival in hospital.   Patient is currently hemodynamically stable, breathing well on room air. She appears to be resting comfortably in no acute distress.  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 Tylenol is listed as an allergy, although appears patient has had Tylenol within the last few years without any recorded adverse effects. Given her CKD will avoid any NSAID use. At this juncture do not believe escalating to opioid medication would be appropriate. Explained to patient the IV medication the day team had mentioned was IV pantoprazole for gastritis, which would only improve abdominal pain. Discussed turning off lights and using ice/cold compress on head tonight to minimize any disturbances. Also will prescribe a sleeping aid tonight per patient's request.  - Keep lights turned off, cold compress for HA - Avoiding NSAID's, opioids  - Initiate ramelteon 8mg  QHS PRN for insomnia  Sanjuan Dame, MD Internal Medicine PGY-2 [phone]
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
## 2021-08-04 ENCOUNTER — Inpatient Hospital Stay (HOSPITAL_COMMUNITY): Payer: Medicare HMO

## 2021-08-04 DIAGNOSIS — Z7189 Other specified counseling: Secondary | ICD-10-CM

## 2021-08-04 HISTORY — PX: IR US GUIDE VASC ACCESS RIGHT: IMG2390

## 2021-08-04 HISTORY — PX: IR FLUORO GUIDE CV LINE RIGHT: IMG2283

## 2021-08-04 LAB — CBC
HCT: 22.7 % — ABNORMAL LOW (ref 36.0–46.0)
Hemoglobin: 7.2 g/dL — ABNORMAL LOW (ref 12.0–15.0)
MCH: 26.9 pg (ref 26.0–34.0)
MCHC: 31.7 g/dL (ref 30.0–36.0)
MCV: 84.7 fL (ref 80.0–100.0)
Platelets: 204 10*3/uL (ref 150–400)
RBC: 2.68 MIL/uL — ABNORMAL LOW (ref 3.87–5.11)
RDW: 13.5 % (ref 11.5–15.5)
WBC: 4.7 10*3/uL (ref 4.0–10.5)
nRBC: 0 % (ref 0.0–0.2)

## 2021-08-04 LAB — BASIC METABOLIC PANEL WITH GFR
Anion gap: 8 (ref 5–15)
BUN: 71 mg/dL — ABNORMAL HIGH (ref 8–23)
CO2: 22 mmol/L (ref 22–32)
Calcium: 8.4 mg/dL — ABNORMAL LOW (ref 8.9–10.3)
Chloride: 109 mmol/L (ref 98–111)
Creatinine, Ser: 5.81 mg/dL — ABNORMAL HIGH (ref 0.44–1.00)
GFR, Estimated: 8 mL/min — ABNORMAL LOW
Glucose, Bld: 94 mg/dL (ref 70–99)
Potassium: 4.7 mmol/L (ref 3.5–5.1)
Sodium: 139 mmol/L (ref 135–145)

## 2021-08-04 LAB — HEPATITIS B SURFACE ANTIGEN: Hepatitis B Surface Ag: NONREACTIVE

## 2021-08-04 LAB — HEPATITIS B SURFACE ANTIBODY,QUALITATIVE: Hep B S Ab: REACTIVE — AB

## 2021-08-04 LAB — PARATHYROID HORMONE, INTACT (NO CA): PTH: 281 pg/mL — ABNORMAL HIGH (ref 15–65)

## 2021-08-04 MED ORDER — FENTANYL CITRATE (PF) 100 MCG/2ML IJ SOLN
INTRAMUSCULAR | Status: AC
  Filled 2021-08-04: qty 2

## 2021-08-04 MED ORDER — HEPARIN SODIUM (PORCINE) 1000 UNIT/ML IJ SOLN
INTRAMUSCULAR | Status: AC
  Administered 2021-08-04: 1000 [IU] via INTRAVENOUS_CENTRAL
  Filled 2021-08-04: qty 1

## 2021-08-04 MED ORDER — HYDROMORPHONE HCL 2 MG PO TABS
1.0000 mg | ORAL_TABLET | ORAL | Status: DC | PRN
  Administered 2021-08-04 – 2021-08-10 (×17): 1 mg via ORAL
  Filled 2021-08-04 (×18): qty 1

## 2021-08-04 MED ORDER — HYDRALAZINE HCL 50 MG PO TABS
50.0000 mg | ORAL_TABLET | Freq: Three times a day (TID) | ORAL | Status: DC
  Administered 2021-08-04 – 2021-08-12 (×23): 50 mg via ORAL
  Filled 2021-08-04 (×24): qty 1

## 2021-08-04 MED ORDER — FENTANYL CITRATE (PF) 100 MCG/2ML IJ SOLN
INTRAMUSCULAR | Status: AC | PRN
  Administered 2021-08-04: 25 ug via INTRAVENOUS

## 2021-08-04 MED ORDER — GELATIN ABSORBABLE 12-7 MM EX MISC
CUTANEOUS | Status: AC
  Filled 2021-08-04: qty 1

## 2021-08-04 MED ORDER — CARVEDILOL 12.5 MG PO TABS
12.5000 mg | ORAL_TABLET | Freq: Two times a day (BID) | ORAL | Status: DC
  Administered 2021-08-05 – 2021-08-12 (×14): 12.5 mg via ORAL
  Filled 2021-08-04 (×15): qty 1

## 2021-08-04 MED ORDER — MIDAZOLAM HCL 2 MG/2ML IJ SOLN
INTRAMUSCULAR | Status: AC | PRN
  Administered 2021-08-04: 1 mg via INTRAVENOUS

## 2021-08-04 MED ORDER — CEFAZOLIN SODIUM-DEXTROSE 2-4 GM/100ML-% IV SOLN
INTRAVENOUS | Status: AC
  Administered 2021-08-04: 2 g via INTRAVENOUS
  Filled 2021-08-04: qty 100

## 2021-08-04 MED ORDER — PANTOPRAZOLE SODIUM 40 MG IV SOLR
40.0000 mg | Freq: Two times a day (BID) | INTRAVENOUS | Status: DC
  Administered 2021-08-04 – 2021-08-08 (×8): 40 mg via INTRAVENOUS
  Filled 2021-08-04 (×8): qty 40

## 2021-08-04 MED ORDER — LIDOCAINE HCL (PF) 1 % IJ SOLN
INTRAMUSCULAR | Status: AC | PRN
  Administered 2021-08-04: 8 mL

## 2021-08-04 MED ORDER — LIDOCAINE HCL 1 % IJ SOLN
INTRAMUSCULAR | Status: AC
  Administered 2021-08-04: 20 mL
  Filled 2021-08-04: qty 20

## 2021-08-04 MED ORDER — MIDAZOLAM HCL 2 MG/2ML IJ SOLN
INTRAMUSCULAR | Status: AC
  Filled 2021-08-04: qty 2

## 2021-08-04 MED ORDER — LIDOCAINE VISCOUS HCL 2 % MT SOLN
15.0000 mL | Freq: Once | OROMUCOSAL | Status: AC
  Administered 2021-08-04: 15 mL via ORAL
  Filled 2021-08-04: qty 15

## 2021-08-04 MED ORDER — ALUM & MAG HYDROXIDE-SIMETH 200-200-20 MG/5ML PO SUSP
30.0000 mL | Freq: Once | ORAL | Status: AC
  Administered 2021-08-04: 30 mL via ORAL
  Filled 2021-08-04: qty 30

## 2021-08-04 MED ORDER — LIDOCAINE HCL 1 % IJ SOLN
INTRAMUSCULAR | Status: AC
  Filled 2021-08-04: qty 20

## 2021-08-04 MED ORDER — SODIUM CHLORIDE 0.9 % IV SOLN
20.0000 ug | Freq: Once | INTRAVENOUS | Status: AC
  Administered 2021-08-04: 20 ug via INTRAVENOUS
  Filled 2021-08-04: qty 5

## 2021-08-04 MED ORDER — FUROSEMIDE 10 MG/ML IJ SOLN
160.0000 mg | Freq: Once | INTRAVENOUS | Status: AC
  Administered 2021-08-04: 160 mg via INTRAVENOUS
  Filled 2021-08-04: qty 16

## 2021-08-04 MED ORDER — SODIUM CHLORIDE 0.9 % IV SOLN
250.0000 mg | Freq: Every day | INTRAVENOUS | Status: DC
  Administered 2021-08-04: 250 mg via INTRAVENOUS
  Filled 2021-08-04 (×2): qty 20

## 2021-08-04 NOTE — Procedures (Signed)
Interventional Radiology Procedure Note  Procedure: Tunneled HD catheter  Indication: Renal failure  Findings: Please refer to procedural dictation for full description.  Complications: None  EBL: < 10 mL  Wilfredo Canterbury, MD [phone]   

## 2021-08-04 NOTE — Progress Notes (Cosign Needed Addendum)
HD#1 Subjective:  Overnight Events: NAEO   Patient feeling a lot better this morning, says that she is still feeling a little bit of shortness of breath when she lays down but feels less bloated in her abdomen when she does. She says that her stomach pain has also improved and she has been able to eat a little more. Her headache comes and goes but it is also improved.  Objective:  Vital signs in last 24 hours: Vitals:   08/04/21 1630 08/04/21 1705 08/04/21 1739 08/04/21 1936  BP: (!) 146/88 (!) 161/88 (!) 165/91 (!) 174/87  Pulse: 79 79 89 76  Resp:  20 18 18   Temp:  (!) 97.5 F (36.4 C) 98.5 F (36.9 C) 97.6 F (36.4 C)  TempSrc:  Oral Oral Oral  SpO2:  99% 97% 95%  Weight:  63 kg     Supplemental O2: Room Air SpO2: 95 % O2 Flow Rate (L/min): 2 L/min   Physical Exam:  Constitutional: Elderly woman sitting upright without support in bed in NAD HENT: normocephalic atraumatic, mucous membranes moist Eyes: conjunctiva non-erythematous Neck: supple Cardiovascular: regular rate and rhythm, no m/r/g Pulmonary/Chest: normal WOB lungs CTAB Abdominal: soft, non-distended MSK: normal bulk and tone Neurological: alert & answering questions appropriately, moves all extremities spontaneously Skin: tunneled catheter in the RUE dressing c/d/i Psych: normal affect  Filed Weights   08/02/21 2000 08/04/21 1436 08/04/21 1705  Weight: 63.1 kg 63.5 kg 63 kg     Intake/Output Summary (Last 24 hours) at 08/04/2021 2149 Last data filed at 08/04/2021 1705 Gross per 24 hour  Intake 340 ml  Output 500 ml  Net -160 ml   Net IO Since Admission: 70 mL [08/04/21 2149]  Pertinent Labs: CBC Latest Ref Rng & Units 08/04/2021 08/03/2021 08/03/2021  WBC 4.0 - 10.5 K/uL 4.7 6.2 4.1  Hemoglobin 12.0 - 15.0 g/dL 7.2(L) 7.8(L) 7.0(L)  Hematocrit 36.0 - 46.0 % 22.7(L) 24.1(L) 22.2(L)  Platelets 150 - 400 K/uL 204 210 206    CMP Latest Ref Rng & Units 08/04/2021 08/03/2021 08/02/2021  Glucose  70 - 99 mg/dL 94 75 100(H)  BUN 8 - 23 mg/dL 71(H) 61(H) 62(H)  Creatinine 0.44 - 1.00 mg/dL 5.81(H) 5.52(H) 5.60(H)  Sodium 135 - 145 mmol/L 139 139 139  Potassium 3.5 - 5.1 mmol/L 4.7 4.6 4.3  Chloride 98 - 111 mmol/L 109 110 108  CO2 22 - 32 mmol/L 22 22 20(L)  Calcium 8.9 - 10.3 mg/dL 8.4(L) 8.0(L) 8.4(L)  Total Protein 6.5 - 8.1 g/dL - - 5.8(L)  Total Bilirubin 0.3 - 1.2 mg/dL - - 0.7  Alkaline Phos 38 - 126 U/L - - 64  AST 15 - 41 U/L - - 32  ALT 0 - 44 U/L - - 28    Imaging: IR Fluoro Guide CV Line Right  Result Date: 08/04/2021 INDICATION: Renal failure EXAM: Ultrasound and fluoroscopy guided tunneled dialysis catheter placement MEDICATIONS: Ancef 2 g IV; The antibiotic was administered within an appropriate time interval prior to skin puncture. ANESTHESIA/SEDATION: Moderate (conscious) sedation was employed during this procedure. A total of Versed 1 mg and Fentanyl 25 mcg was administered intravenously by the radiology nurse. Total intra-service moderate Sedation Time: 10 minutes. The patient's level of consciousness and vital signs were monitored continuously by radiology nursing throughout the procedure under my direct supervision. FLUOROSCOPY TIME:  Fluoroscopy Time: 1 minutes 0 seconds (2.8 mGy). COMPLICATIONS: None immediate. PROCEDURE: Informed written consent was obtained from the patient after a  thorough discussion of the procedural risks, benefits and alternatives. All questions were addressed. Maximal Sterile Barrier Technique was utilized including caps, mask, sterile gowns, sterile gloves, sterile drape, hand hygiene and skin antiseptic. A timeout was performed prior to the initiation of the procedure. The right internal jugular vein was evaluated with ultrasound and shown to be patent. A permanent ultrasound image was obtained and placed in the patient's medical record. Using sterile gel and a sterile probe cover, the right internal jugular vein was entered with a 21 ga needle  during real time ultrasound guidance. 0.018 inch guidewire placed and 21 ga needle exchanged for transitional dilator set. Utilizing fluoroscopy, 0.035 inch guidewire advanced through the needle without difficulty. Seriel dilation was performed and peel-away sheath was placed. Attention then turned to the right anterior upper chest. Following local lidocaine administration, the hemodialysis catheter was tunneled from the chest wall to the venotomy site. The catheter was inserted through the peel-away sheath. The tip of the catheter was positioned within the right atrium using fluoroscopic guidance. All lumens of the catheter aspirated and flushed well. The dialysis lumens were locked with Heparin. The catheter was secured to the skin with suture. The insertion site was covered with sterile dressing. IMPRESSION: 19 cm right IJ tunneled hemodialysis catheter is ready for use. Electronically Signed   By: Miachel Roux M.D.   On: 08/04/2021 11:25   IR US Guide Vasc Access Right  Result Date: 08/04/2021 INDICATION: Renal failure EXAM: Ultrasound and fluoroscopy guided tunneled dialysis catheter placement MEDICATIONS: Ancef 2 g IV; The antibiotic was administered within an appropriate time interval prior to skin puncture. ANESTHESIA/SEDATION: Moderate (conscious) sedation was employed during this procedure. A total of Versed 1 mg and Fentanyl 25 mcg was administered intravenously by the radiology nurse. Total intra-service moderate Sedation Time: 10 minutes. The patient's level of consciousness and vital signs were monitored continuously by radiology nursing throughout the procedure under my direct supervision. FLUOROSCOPY TIME:  Fluoroscopy Time: 1 minutes 0 seconds (2.8 mGy). COMPLICATIONS: None immediate. PROCEDURE: Informed written consent was obtained from the patient after a thorough discussion of the procedural risks, benefits and alternatives. All questions were addressed. Maximal Sterile Barrier Technique was  utilized including caps, mask, sterile gowns, sterile gloves, sterile drape, hand hygiene and skin antiseptic. A timeout was performed prior to the initiation of the procedure. The right internal jugular vein was evaluated with ultrasound and shown to be patent. A permanent ultrasound image was obtained and placed in the patient's medical record. Using sterile gel and a sterile probe cover, the right internal jugular vein was entered with a 21 ga needle during real time ultrasound guidance. 0.018 inch guidewire placed and 21 ga needle exchanged for transitional dilator set. Utilizing fluoroscopy, 0.035 inch guidewire advanced through the needle without difficulty. Seriel dilation was performed and peel-away sheath was placed. Attention then turned to the right anterior upper chest. Following local lidocaine administration, the hemodialysis catheter was tunneled from the chest wall to the venotomy site. The catheter was inserted through the peel-away sheath. The tip of the catheter was positioned within the right atrium using fluoroscopic guidance. All lumens of the catheter aspirated and flushed well. The dialysis lumens were locked with Heparin. The catheter was secured to the skin with suture. The insertion site was covered with sterile dressing. IMPRESSION: 19 cm right IJ tunneled hemodialysis catheter is ready for use. Electronically Signed   By: Miachel Roux M.D.   On: 08/04/2021 11:25    Assessment/Plan:  Active Problems:   Hypertensive urgency   Patient Summary: LYNORA DYMOND is a 65 y.o. 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   #Hypertensive Urgency #Headache Patient presented with hypertensive urgency after 2-3 weeks of medication non-adherence. Her home meds include Cardizem 240 mg qd, Hydralazine 100 mg TID, and  Clonidine 0.1 mg BID. Will switch from cardizem to coreg due recent HF diagnosis. With dialysis yesterday and today expect BP to continue to decrease. BPs have been running in the 140-150s, will see how her pressures are post dialysis today and increase her dose of hydralazine if she remains above 150.  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 - first dialysis session 11/1, next session 11/2 - Avoid nephrotoxic medications -Nephrology following appreciate assistance  - HF per nephrology  (11/1 and 11/2)  #Anemia Patient's Hg dropped to 6.3 on 11/2, type and screen and one unit pRBC ordered. Suspect this is related to prolonged bleeding of tunneled catheter yesterday (see below) rather than GI source given negative occult blood test, however given patient's abdominal pain this cannot be totally ruled out.  - daily CBC - transfuse <7 - f/u post transfusion H/H   #Tunneled Catheter Bleed Patient had continued oozing/trouble clotting after her catheter placement on 11/1, suspected this was related to uremic platelet dysfunction and elevated venous pressure. Dressing appears c/d/I as of 11/2 and has not had any further bleeding since 11/1 - proceed with HD - s/p furosemide 160 and 20 DDAVP on 11/1   #CHF #Concern for amyloidosis She states her chest pain is improved since admission. BNP was 2,292 on 08/02/21. Echo showed EF is 30 to 35 %. LV cavity is dilated. LV shows a Grade III diastolic dysfunction (restrictive) with findings concerning for amyloidosis. Limited in evaluation with contrast due to ESRD, however as patient is now amenable to HD can try and coordinate cardiac MRI with dialysis schedule -coreg 12.5 BID -Cardiac MRI once dialysis starts -Palliative following, appreciate assistance -Strict  I/O -Daily weights - K>4, Mg >2   #Abdominal Pain Patient has been complaining of ongoing abdominal pain, does have a history of GERD but not on treatment. Heme occult negative, do not suspect active bleed but could have some component of esophagitis/gastritis. Have tried maalox which did not helped much. Increased pain medications yesterday, patient responded well and seems a lot more comfortable today. She has also received one round of HD which has seemed to help her feeling of bloating. - consider barium swallow - CBC daily - If Hgb less than 7 than transfuse  - dilaudid 1g Q4 PRN -Continue IV Protonix 40 mg and Carafate 1g TID and qhs   #Paroxysmal Atrial Fibrillation Currently in NSR. HR <100.  -Will hold Eliquis in setting of low hgb/possible bleed   #Hx of CVA #Hyperlipidemia Patient has hx of HLD and hx of CVA. Her lipid panel on 08/03/21 showed her cholesterol to be 134, LDL to be 75. Patient is on eliquis due to her previous stroke and hx of a fib. -Continue home statin -Hold eliquis as above   Diet: Heart Healthy IVF: None,None VTE: None Code: DNI  Prior to Admission Living Arrangement: Home Anticipated Discharge: Home Barriers to Discharge:  Dispo: Anticipated discharge to Home in 3 days pending HD and medical stabilization.   [name], MD Internal Medicine Resident PGY-1 Pager [phone] Please contact the on call pager after 5 pm and on weekends at [phone].

## 2021-08-04 NOTE — Progress Notes (Signed)
HD#1 Subjective:  Overnight Events: Patient complained of headache, conservative measures taken  On interview this morning she is still complaining of a lot of abdominal pain and still has a headache. Nothing we have done so far has made her pain better. She is also having pain at the site of her catheter placement.  Objective:  Vital signs in last 24 hours: Vitals:   08/04/21 0840 08/04/21 0845 08/04/21 0920 08/04/21 1335  BP: (!) 195/121  (!) 188/105 (!) 179/101  Pulse: 96 91 91   Resp: (!) 29 19 18    Temp:   98.1 F (36.7 C)   TempSrc:   Oral   SpO2: 96% 96% 94%   Weight:       Supplemental O2: Room Air SpO2: 94 % O2 Flow Rate (L/min): 2 L/min   Physical Exam:  Constitutional: ill appearing female uncomfortable but in NAD HENT: normocephalic atraumatic, mucous membranes moist Eyes: conjunctiva non-erythematous Neck: supple Cardiovascular: regular rate and rhythm, no m/r/g Pulmonary/Chest: tachypneic, slightly increased work of breathing, lungs CTAB Abdominal: soft, tender to palpation, non-distended MSK: normal bulk and tone Neurological: alert & oriented x 3, 5/5 strength in bilateral upper and lower extremities, normal gait Skin: tunneled catheter in the RUE with blood soaked dressing and small amount of oozing Psych: normal affect  Filed Weights   08/02/21 2000  Weight: 63.1 kg     Intake/Output Summary (Last 24 hours) at 08/04/2021 1532 Last data filed at 08/04/2021 0857 Gross per 24 hour  Intake 340 ml  Output --  Net 340 ml   Net IO Since Admission: 570 mL [08/04/21 1532]  Pertinent Labs: CBC Latest Ref Rng & Units 08/04/2021 08/03/2021 08/03/2021  WBC 4.0 - 10.5 K/uL 4.7 6.2 4.1  Hemoglobin 12.0 - 15.0 g/dL 7.2(L) 7.8(L) 7.0(L)  Hematocrit 36.0 - 46.0 % 22.7(L) 24.1(L) 22.2(L)  Platelets 150 - 400 K/uL 204 210 206    CMP Latest Ref Rng & Units 08/04/2021 08/03/2021 08/02/2021  Glucose 70 - 99 mg/dL 94 75 100(H)  BUN 8 - 23 mg/dL 71(H) 61(H)  62(H)  Creatinine 0.44 - 1.00 mg/dL 5.81(H) 5.52(H) 5.60(H)  Sodium 135 - 145 mmol/L 139 139 139  Potassium 3.5 - 5.1 mmol/L 4.7 4.6 4.3  Chloride 98 - 111 mmol/L 109 110 108  CO2 22 - 32 mmol/L 22 22 20(L)  Calcium 8.9 - 10.3 mg/dL 8.4(L) 8.0(L) 8.4(L)  Total Protein 6.5 - 8.1 g/dL - - 5.8(L)  Total Bilirubin 0.3 - 1.2 mg/dL - - 0.7  Alkaline Phos 38 - 126 U/L - - 64  AST 15 - 41 U/L - - 32  ALT 0 - 44 U/L - - 28    Imaging: IR Fluoro Guide CV Line Right  Result Date: 08/04/2021 INDICATION: Renal failure EXAM: Ultrasound and fluoroscopy guided tunneled dialysis catheter placement MEDICATIONS: Ancef 2 g IV; The antibiotic was administered within an appropriate time interval prior to skin puncture. ANESTHESIA/SEDATION: Moderate (conscious) sedation was employed during this procedure. A total of Versed 1 mg and Fentanyl 25 mcg was administered intravenously by the radiology nurse. Total intra-service moderate Sedation Time: 10 minutes. The patient's level of consciousness and vital signs were monitored continuously by radiology nursing throughout the procedure under my direct supervision. FLUOROSCOPY TIME:  Fluoroscopy Time: 1 minutes 0 seconds (2.8 mGy). COMPLICATIONS: None immediate. PROCEDURE: Informed written consent was obtained from the patient after a thorough discussion of the procedural risks, benefits and alternatives. All questions were addressed. Maximal Sterile Barrier  Technique was utilized including caps, mask, sterile gowns, sterile gloves, sterile drape, hand hygiene and skin antiseptic. A timeout was performed prior to the initiation of the procedure. The right internal jugular vein was evaluated with ultrasound and shown to be patent. A permanent ultrasound image was obtained and placed in the patient's medical record. Using sterile gel and a sterile probe cover, the right internal jugular vein was entered with a 21 ga needle during real time ultrasound guidance. 0.018 inch  guidewire placed and 21 ga needle exchanged for transitional dilator set. Utilizing fluoroscopy, 0.035 inch guidewire advanced through the needle without difficulty. Seriel dilation was performed and peel-away sheath was placed. Attention then turned to the right anterior upper chest. Following local lidocaine administration, the hemodialysis catheter was tunneled from the chest wall to the venotomy site. The catheter was inserted through the peel-away sheath. The tip of the catheter was positioned within the right atrium using fluoroscopic guidance. All lumens of the catheter aspirated and flushed well. The dialysis lumens were locked with Heparin. The catheter was secured to the skin with suture. The insertion site was covered with sterile dressing. IMPRESSION: 19 cm right IJ tunneled hemodialysis catheter is ready for use. Electronically Signed   By: Miachel Roux M.D.   On: 08/04/2021 11:25   IR US Guide Vasc Access Right  Result Date: 08/04/2021 INDICATION: Renal failure EXAM: Ultrasound and fluoroscopy guided tunneled dialysis catheter placement MEDICATIONS: Ancef 2 g IV; The antibiotic was administered within an appropriate time interval prior to skin puncture. ANESTHESIA/SEDATION: Moderate (conscious) sedation was employed during this procedure. A total of Versed 1 mg and Fentanyl 25 mcg was administered intravenously by the radiology nurse. Total intra-service moderate Sedation Time: 10 minutes. The patient's level of consciousness and vital signs were monitored continuously by radiology nursing throughout the procedure under my direct supervision. FLUOROSCOPY TIME:  Fluoroscopy Time: 1 minutes 0 seconds (2.8 mGy). COMPLICATIONS: None immediate. PROCEDURE: Informed written consent was obtained from the patient after a thorough discussion of the procedural risks, benefits and alternatives. All questions were addressed. Maximal Sterile Barrier Technique was utilized including caps, mask, sterile gowns,  sterile gloves, sterile drape, hand hygiene and skin antiseptic. A timeout was performed prior to the initiation of the procedure. The right internal jugular vein was evaluated with ultrasound and shown to be patent. A permanent ultrasound image was obtained and placed in the patient's medical record. Using sterile gel and a sterile probe cover, the right internal jugular vein was entered with a 21 ga needle during real time ultrasound guidance. 0.018 inch guidewire placed and 21 ga needle exchanged for transitional dilator set. Utilizing fluoroscopy, 0.035 inch guidewire advanced through the needle without difficulty. Seriel dilation was performed and peel-away sheath was placed. Attention then turned to the right anterior upper chest. Following local lidocaine administration, the hemodialysis catheter was tunneled from the chest wall to the venotomy site. The catheter was inserted through the peel-away sheath. The tip of the catheter was positioned within the right atrium using fluoroscopic guidance. All lumens of the catheter aspirated and flushed well. The dialysis lumens were locked with Heparin. The catheter was secured to the skin with suture. The insertion site was covered with sterile dressing. IMPRESSION: 19 cm right IJ tunneled hemodialysis catheter is ready for use. Electronically Signed   By: Miachel Roux M.D.   On: 08/04/2021 11:25    Assessment/Plan:   Active Problems:   Hypertensive urgency   Patient Summary: Audrey Hill is a  65 y.o. 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  #Hypertensive Urgency Patient presented with hypertensive urgency after 2-3 weeks of medication non-adherence. Her home meds include Cardizem 240 mg qd, Hydralazine 100 mg TID, and Clonidine 0.1 mg BID. Will switch from cardizem to coreg due  recent HF diagnosis. 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 -NPO for image-guided tunneled dialysis catheter placement with IR today - Avoid nephrotoxic medications -Nephrology following appreciate assistance  - HF per nephrology once able (11/1 and 11/2)  #Tunneled Catheter Bleed Patient had continued oozing/trouble clotting after her catheter placement, suspect this is related to uremic platelet dysfunction and elevated venous pressure - proceed with HD - per nephrology furosemide 160 and 20 DDAVP   #CHF #Concern for amyloidosis She states her chest pain is improved since admission. BNP was 2,292 on 08/02/21. Echo showed EF is 30 to 35 %. LV cavity is dilated. LV shows a Grade III diastolic dysfunction (restrictive) with findings concerning for amyloidosis. Limited in evaluation with contrast due to ESRD, however as patient is now amenable to HD can try and coordinate cardiac MRI with dialysis schedule -coreg 12.5 BID -Cardiac MRI once dialysis starts -Palliative following, appreciate assistance -Strict I/O -Daily weights   #Abdominal Pain Patient has been complaining of ongoing abdominal pain, does have a history of GERD but not one treatment. Heme occult negative, do not suspect active bleed but could have some component of esophagitis/gastritis. Have tried maalox which has not helped much. - consider barium swallow - CBC daily - If Hgb less than 7 than transfuse  - dilaudid 1g Q4 PRN -Continue IV Protonix 40 mg and Carafate 1g TID and qhs  #Paroxysmal Atrial Fibrillation Currently in NSR. HR <100.  -Will hold Eliquis in setting of low hgb/possible bleed   #Hx of CVA #Hyperlipidemia Patient has hx of HLD and hx of CVA. Her lipid  panel on 08/03/21 showed her cholesterol to be 134, LDL to be 75. Patient is on eliquis due to her previous stroke and hx of a fib. -Continue home statin -Hold eliquis as above  Diet: Heart Healthy IVF: None,None VTE: None Code: DNI  Prior to Admission Living Arrangement: Home Anticipated Discharge: Home Barriers to Discharge:  Dispo: Anticipated discharge to Home in 3 days pending HD and medical stabilization.   [name], MD Internal Medicine Resident PGY-1 Pager [phone] Please contact the on call pager after 5 pm and on weekends at [phone].

## 2021-08-04 NOTE — Progress Notes (Signed)
Patient arrived from IR.  Drainage noted on HD cath dressing.  Bleeding heavily from underneath lower portion of dressing noted around 10am.  Dr. Dwaine Gale in IR notified of bleeding.  Advised to hold pressure above dressing for 10 min and he would come to assess.  Pressure held for approx 10 min and bleeding appears to have stopped.  Patient is alert and oriented sitting up in bed.  Will continue to monitor HD cath dressing for bleeding.

## 2021-08-04 NOTE — Progress Notes (Signed)
Patient RIJ HD placed today by IR. Paged by primary team due to persistent blood oozing from the catheter exit site despite Manual pressure at the venotomy site for 15 minutes.. Pressure dressing was saturated with blood. The pressure dressing was removed and the catheter site was cleaned. Thrombin and clot guaze applied to exit site. Where oozing was appreciated.  Bedside RN notified IR that the exit site began oozing again. The catheter site was once again  cleaned and 1% lidocaine applied to the exit site. 2 purse string sutures applied to the exit site by Dr. Dwaine Gale. Unfortunately the oozing was persistent.  Believe underlying cause to be a combination of the patient's high blood pressure and uremia. Team notified. Recommend Patient scheduled dialysis be moved up to today. This was communicated directly to Dr. Joylene Grapes who will also be ordering. DDAVP
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
## 2021-08-04 NOTE — Progress Notes (Signed)
Nephrology Follow-Up Consult note   Assessment/Recommendations: Audrey Hill is a/an 65 y.o. 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  She was hesitant to start dialysis but accepting now. She has had issues with compliance but because she remains fairly functional she is a suitable candidate for dialysis -Appreciate TDC placement by IR -HD today and tomorrow -Will discuss permanent access more with patient tomorrow. Looks like she was referred to vascular surgery outpatient -Continue to monitor daily Cr, Dose meds for GFR -Monitor Daily I/Os, Daily weight  -Maintain MAP>65 for optimal renal perfusion.  -Avoid nephrotoxic medications including NSAIDs and Vanc/Zosyn combo  Bleeding: likely related to high venous pressure from congestion and uremia contributing. DDAVP 43mcg now and IV lasix 160mg  x1. HD #1 today. Continue efforts with IR to stop bleeding. Appreciate help   Epigastric Pain/Nausea and Vomiting: being treated for reflux per primary. Uremia likely playing some role in nausea.   Hypertension: BP higher today. Volume removal with HD. If BP remains high after HD would start on additional meds; beta blocker would be fine.   Afib: meds per primary   Anemia of CKD: also blood loss possible. Maybe PUD with epigastric pain. Iron depleted. Start iron 250mg  x4 doses. Consider GI per primary team.   Recommendations conveyed to primary service.    [facility] 08/04/2021 12:33 PM  ___________________________________________________________  CC: ESRD  Interval History/Subjective: Patient received TDC but has had significant bleeding around the catheter site despite aggressive efforts by IR.  No  other complaints per patient today   Medications:  Current Facility-Administered Medications  Medication Dose Route Frequency Provider Last Rate Last Admin   0.9 %  sodium chloride infusion   Intravenous PRN Angelica Pou, MD 10 mL/hr at 08/03/21 0139 New Bag at 08/03/21 0139   0.9 %  sodium chloride infusion  100 mL Intravenous PRN Reesa Chew, MD       0.9 %  sodium chloride infusion  100 mL Intravenous PRN Reesa Chew, MD       acetaminophen (TYLENOL) tablet 650 mg  650 mg Oral Q6H PRN Sanjuan Dame, MD       alteplase (CATHFLO ACTIVASE) injection 2 mg  2 mg Intracatheter Once PRN Reesa Chew, MD       atorvastatin (LIPITOR) tablet 20 mg  20 mg Oral Daily Idamae Schuller, MD   20 mg at 08/04/21 0920   benzonatate (TESSALON) capsule 100 mg  100 mg Oral TID PRN Sanjuan Dame, MD   100 mg at 08/04/21 1113   cephALEXin (KEFLEX) capsule 250 mg  250 mg Oral Q12H Atway, Rayann N, DO   250 mg at 08/04/21 7001   Chlorhexidine Gluconate Cloth 2 % PADS 6 each  6 each Topical Q0600 Reesa Chew, MD   6 each at 08/04/21 7494   desmopressin (DDAVP) 20 mcg in sodium chloride 0.9 % 50 mL IVPB  20 mcg Intravenous Once Reesa Chew, MD       diltiazem (CARDIZEM CD) 24 hr capsule 120 mg  120 mg Oral Daily Idamae Schuller, MD   120 mg at 08/04/21 0920   prochlorperazine (COMPAZINE) injection 10 mg  10 mg Intravenous Once Idamae Schuller, MD  Followed by   diphenhydrAMINE (BENADRYL) injection 12.5 mg  12.5 mg Intravenous Once Idamae Schuller, MD       ferric gluconate (FERRLECIT) 250 mg in sodium chloride 0.9 % 250 mL IVPB  250 mg Intravenous Daily Reesa Chew, MD 135 mL/hr at 08/04/21 1059 250 mg at 08/04/21 1059   furosemide (LASIX) 160 mg in dextrose 5 % 50 mL IVPB  160 mg Intravenous Once Reesa Chew, MD       heparin injection 1,000 Units  1,000 Units Dialysis PRN Reesa Chew, MD       heparin sodium (porcine) 1000 UNIT/ML injection             hydrALAZINE (APRESOLINE) tablet 50 mg  50 mg Oral Q8H Demaio, Alexa, MD   50 mg at 08/04/21 0926   HYDROmorphone (DILAUDID) tablet 1 mg  1 mg Oral Q4H PRN Demaio, Alexa, MD   1 mg at 08/04/21 1110   influenza vaccine adjuvanted (FLUAD) injection 0.5 mL  0.5 mL Intramuscular Tomorrow-1000 Angelica Pou, MD       lidocaine (PF) (XYLOCAINE) 1 % injection 5 mL  5 mL Intradermal PRN Reesa Chew, MD       lidocaine (XYLOCAINE) 1 % (with pres) injection            lidocaine (XYLOCAINE) 1 % (with pres) injection            lidocaine-prilocaine (EMLA) cream 1 application  1 application Topical PRN Reesa Chew, MD       pantoprazole (PROTONIX) injection 40 mg  40 mg Intravenous Q12H Lacinda Axon, MD       pentafluoroprop-tetrafluoroeth (GEBAUERS) aerosol 1 application  1 application Topical PRN Reesa Chew, MD       pneumococcal 23 valent vaccine (PNEUMOVAX-23) injection 0.5 mL  0.5 mL Intramuscular Tomorrow-1000 Angelica Pou, MD       ramelteon (ROZEREM) tablet 8 mg  8 mg Oral QHS PRN Sanjuan Dame, MD   8 mg at 08/03/21 2347   sucralfate (CARAFATE) 1 GM/10ML suspension 1 g  1 g Oral TID WC & HS Idamae Schuller, MD   1 g at 08/04/21 0919   trimethobenzamide (TIGAN) capsule 300 mg  300 mg Oral Q8H PRN Idamae Schuller, MD          Review of Systems: 10 systems reviewed and negative except per interval history/subjective  Physical Exam: Vitals:   08/04/21 0845 08/04/21 0920  BP:  (!) 188/105  Pulse: 91 91  Resp: 19 18  Temp:  98.1 F (36.7 C)  SpO2: 96% 94%   Total I/O In: 100 [IV Piggyback:100] Out: -   Intake/Output Summary (Last 24 hours) at 08/04/2021 1233 Last data filed at 08/04/2021 0857 Gross per 24 hour  Intake 340 ml  Output --  Net 340 ml   Constitutional: Tired appearing, lying in bed, right sided TDC in place with oozing around access site ENMT: ears and nose without scars or lesions, MMM, poor dentition CV: normal rate, no  edema Respiratory: clear to auscultation, normal work of breathing Gastrointestinal: soft, non-tender, no palpable masses or hernias Skin: no visible lesions or rashes Psych: alert, judgement/insight appropriate, appropriate mood and affect   Test Results I personally reviewed new and old clinical labs and radiology tests Lab Results  Component Value Date   NA 139 08/04/2021   K 4.7 08/04/2021   CL 109 08/04/2021   CO2 22 08/04/2021   BUN 71 (H)  08/04/2021   CREATININE 5.81 (H) 08/04/2021   CALCIUM 8.4 (L) 08/04/2021   ALBUMIN 3.1 (L) 08/02/2021   PHOS 4.4 08/02/2021

## 2021-08-05 LAB — HEMOGLOBIN AND HEMATOCRIT, BLOOD
HCT: 22.9 % — ABNORMAL LOW (ref 36.0–46.0)
Hemoglobin: 7.6 g/dL — ABNORMAL LOW (ref 12.0–15.0)

## 2021-08-05 LAB — CBC
HCT: 19.5 % — ABNORMAL LOW (ref 36.0–46.0)
Hemoglobin: 6.3 g/dL — CL (ref 12.0–15.0)
MCH: 27.3 pg (ref 26.0–34.0)
MCHC: 32.3 g/dL (ref 30.0–36.0)
MCV: 84.4 fL (ref 80.0–100.0)
Platelets: 169 10*3/uL (ref 150–400)
RBC: 2.31 MIL/uL — ABNORMAL LOW (ref 3.87–5.11)
RDW: 13.4 % (ref 11.5–15.5)
WBC: 5.1 10*3/uL (ref 4.0–10.5)
nRBC: 0 % (ref 0.0–0.2)

## 2021-08-05 LAB — ABO/RH: ABO/RH(D): O POS

## 2021-08-05 LAB — MAGNESIUM: Magnesium: 1.8 mg/dL (ref 1.7–2.4)

## 2021-08-05 LAB — BASIC METABOLIC PANEL
Anion gap: 7 (ref 5–15)
BUN: 41 mg/dL — ABNORMAL HIGH (ref 8–23)
CO2: 25 mmol/L (ref 22–32)
Calcium: 8.1 mg/dL — ABNORMAL LOW (ref 8.9–10.3)
Chloride: 105 mmol/L (ref 98–111)
Creatinine, Ser: 4.13 mg/dL — ABNORMAL HIGH (ref 0.44–1.00)
GFR, Estimated: 11 mL/min — ABNORMAL LOW (ref >60)
Glucose, Bld: 100 mg/dL — ABNORMAL HIGH (ref 70–99)
Potassium: 4.1 mmol/L (ref 3.5–5.1)
Sodium: 137 mmol/L (ref 135–145)

## 2021-08-05 LAB — PREPARE RBC (CROSSMATCH)

## 2021-08-05 LAB — HEPATITIS B SURFACE ANTIBODY, QUANTITATIVE: Hep B S AB Quant (Post): 146 m[IU]/mL (ref >9.9)

## 2021-08-05 MED ORDER — LOSARTAN POTASSIUM 25 MG PO TABS
25.0000 mg | ORAL_TABLET | Freq: Every day | ORAL | Status: DC
  Administered 2021-08-05 – 2021-08-06 (×2): 25 mg via ORAL
  Filled 2021-08-05 (×2): qty 1

## 2021-08-05 MED ORDER — SODIUM CHLORIDE 0.9% IV SOLUTION: Freq: Once | INTRAVENOUS | Status: AC

## 2021-08-05 MED ORDER — MAGNESIUM SULFATE 2 GM/50ML IV SOLN
2.0000 g | Freq: Once | INTRAVENOUS | Status: AC
  Administered 2021-08-05: 2 g via INTRAVENOUS
  Filled 2021-08-05: qty 50

## 2021-08-05 MED ORDER — DARBEPOETIN ALFA 60 MCG/0.3ML IJ SOSY
60.0000 ug | PREFILLED_SYRINGE | INTRAMUSCULAR | Status: DC
Start: 2021-08-07 — End: 2021-08-06

## 2021-08-05 MED ORDER — KIDNEY FAILURE BOOK: Freq: Once | Status: AC

## 2021-08-05 NOTE — Progress Notes (Signed)
Critical lab value Hemoglobin = 6.3. Paged internal medicine on call

## 2021-08-05 NOTE — Progress Notes (Addendum)
Nephrology Follow-Up Consult note   Assessment/Recommendations: Audrey Hill is a/an 65 y.o. 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  She was hesitant to start dialysis but accepting now. She has had issues with compliance but because she remains fairly functional she is a suitable candidate for dialysis -Appreciate TDC placement by IR; bleeding has stopped -HD again today and then likely again on Friday -CLIP process underway -Will continue discussion regarding fistula placement  Bleeding: improved with HD and ddavp   Epigastric Pain/Nausea and Vomiting: being treated for reflux per primary. Uremia likely playing some role in nausea. Improving   Hypertension: BP continues to be high. Volume removal with HD. Will start losartan.   Afib: meds per primary   Anemia of CKD: also blood loss possible. Maybe PUD with epigastric pain. Iron depleted. Hgb <7 today so transfusion will be given. Hold further iron. Start aranesp 46mcg.  Secondary Hyperparathyroidism: PTH 281. Calcium and phos acceptable. No treatment   Recommendations conveyed to primary service.    [facility] 08/05/2021 1:39 PM  ___________________________________________________________  CC: ESRD  Interval History/Subjective: Patient doing much better today.  Bleeding has stopped from catheter site.  Tolerated dialysis with no significant issues yesterday.  States she overall feels better.   Medications:  Current Facility-Administered Medications  Medication Dose Route Frequency Provider Last Rate Last Admin   0.9 %  sodium chloride infusion   Intravenous PRN Angelica Pou, MD 10 mL/hr at 08/03/21 0139 New Bag at 08/03/21 0139    acetaminophen (TYLENOL) tablet 650 mg  650 mg Oral Q6H PRN Sanjuan Dame, MD       atorvastatin (LIPITOR) tablet 20 mg  20 mg Oral Daily Idamae Schuller, MD   20 mg at 08/05/21 1207   benzonatate (TESSALON) capsule 100 mg  100 mg Oral TID PRN Sanjuan Dame, MD   100 mg at 08/04/21 1113   carvedilol (COREG) tablet 12.5 mg  12.5 mg Oral BID WC Demaio, Alexa, MD   12.5 mg at 08/05/21 1207   cephALEXin (KEFLEX) capsule 250 mg  250 mg Oral Q12H Atway, Rayann N, DO   250 mg at 08/05/21 1207   Chlorhexidine Gluconate Cloth 2 % PADS 6 each  6 each Topical Q0600 Reesa Chew, MD   6 each at 08/05/21 0814   prochlorperazine (COMPAZINE) injection 10 mg  10 mg Intravenous Once Idamae Schuller, MD       Followed by   diphenhydrAMINE (BENADRYL) injection 12.5 mg  12.5 mg Intravenous Once Idamae Schuller, MD       hydrALAZINE (APRESOLINE) tablet 50 mg  50 mg Oral Q8H Demaio, Alexa, MD   50 mg at 08/05/21 0601   HYDROmorphone (DILAUDID) tablet 1 mg  1 mg Oral Q4H PRN Demaio, Alexa, MD   1 mg at 08/05/21 0914   influenza vaccine adjuvanted (FLUAD) injection 0.5 mL  0.5 mL Intramuscular Tomorrow-1000 Angelica Pou, MD       kidney failure book   Does not apply Once Reesa Chew, MD       magnesium sulfate IVPB 2 g 50 mL  2 g Intravenous Once Demaio, Alexa, MD 50 mL/hr at 08/05/21 1312 2 g at 08/05/21 1312   pantoprazole (  PROTONIX) injection 40 mg  40 mg Intravenous Q12H Lacinda Axon, MD   40 mg at 08/05/21 1207   pneumococcal 23 valent vaccine (PNEUMOVAX-23) injection 0.5 mL  0.5 mL Intramuscular Tomorrow-1000 Angelica Pou, MD       ramelteon (ROZEREM) tablet 8 mg  8 mg Oral QHS PRN Sanjuan Dame, MD   8 mg at 08/05/21 0153   sucralfate (CARAFATE) 1 GM/10ML suspension 1 g  1 g Oral TID WC & HS Idamae Schuller, MD   1 g at 08/05/21 1207   trimethobenzamide (TIGAN) capsule 300 mg  300 mg Oral Q8H PRN Idamae Schuller, MD          Review of Systems: 10 systems reviewed and negative  except per interval history/subjective  Physical Exam: Vitals:   08/05/21 1109 08/05/21 1148  BP: (!) 183/91 (!) 193/113  Pulse: 92 97  Resp: (!) 24 (!) 22  Temp:  98 F (36.7 C)  SpO2:  99%   Total I/O In: 315 [Blood:315] Out: 1000 [Other:1000]  Intake/Output Summary (Last 24 hours) at 08/05/2021 1339 Last data filed at 08/05/2021 1115 Gross per 24 hour  Intake 555 ml  Output 1500 ml  Net -945 ml   Constitutional: Sitting up in bed, eating breakfast, no concerns ENMT: ears and nose without scars or lesions, MMM, poor dentition CV: normal rate, no edema Chest: [facility] in place with no bleeding Respiratory: clear to auscultation, normal work of breathing Gastrointestinal: soft, non-tender, no palpable masses or hernias Skin: no visible lesions or rashes Psych: alert, judgement/insight appropriate, appropriate mood and affect   Test Results I personally reviewed new and old clinical labs and radiology tests Lab Results  Component Value Date   NA 137 08/05/2021   K 4.1 08/05/2021   CL 105 08/05/2021   CO2 25 08/05/2021   BUN 41 (H) 08/05/2021   CREATININE 4.13 (H) 08/05/2021   CALCIUM 8.1 (L) 08/05/2021   ALBUMIN 3.1 (L) 08/02/2021   PHOS 4.4 08/02/2021

## 2021-08-05 NOTE — Plan of Care (Signed)
  Problem: Education: Goal: Knowledge of General Education information will improve Description: Including pain rating scale, medication(s)/side effects and non-pharmacologic comfort measures 08/05/2021 2159 by Kaylyn Lim, RN Outcome: Progressing 08/05/2021 2158 by Kaylyn Lim, RN Outcome: Progressing   Problem: Health Behavior/Discharge Planning: Goal: Ability to manage health-related needs will improve 08/05/2021 2159 by Kaylyn Lim, RN Outcome: Progressing 08/05/2021 2158 by Kaylyn Lim, RN Outcome: Progressing   Problem: Clinical Measurements: Goal: Ability to maintain clinical measurements within normal limits will improve 08/05/2021 2159 by Kaylyn Lim, RN Outcome: Progressing 08/05/2021 2158 by Kaylyn Lim, RN Outcome: Progressing   Problem: Clinical Measurements: Goal: Respiratory complications will improve 08/05/2021 2159 by Kaylyn Lim, RN Outcome: Progressing 08/05/2021 2158 by Kaylyn Lim, RN Outcome: Progressing   Problem: Clinical Measurements: Goal: Cardiovascular complication will be avoided 08/05/2021 2159 by Kaylyn Lim, RN Outcome: Progressing 08/05/2021 2158 by Kaylyn Lim, RN Outcome: Progressing   Problem: Pain Managment: Goal: General experience of comfort will improve 08/05/2021 2159 by Kaylyn Lim, RN Outcome: Progressing 08/05/2021 2158 by Kaylyn Lim, RN Outcome: Progressing   Problem: Safety: Goal: Ability to remain free from injury will improve 08/05/2021 2159 by Kaylyn Lim, RN Outcome: Progressing 08/05/2021 2158 by Kaylyn Lim, RN Outcome: Progressing   Problem: Skin Integrity: Goal: Risk for impaired skin integrity will decrease 08/05/2021 2159 by Kaylyn Lim, RN Outcome: Progressing 08/05/2021 2158 by Kaylyn Lim, RN Outcome: Progressing   Problem: Fluid Volume: Goal: Compliance with measures to maintain balanced fluid volume will improve Outcome: Progressing

## 2021-08-05 NOTE — Progress Notes (Signed)
CSW awaiting confirmation from [facility] Renal Navigator on HD clinic for patient. Once confirmed will assist with setting up transportation to and from HD for patient. CSW will continue to follow.

## 2021-08-05 NOTE — Progress Notes (Signed)
Requested to see pt for out-pt HD arrangements. Met with pt at bedside while pt receiving treatment. Pt lives in [place]. Discussed with pt clinic preference. Pt prefers a clinic in [place] due to need for transportation services. Pt also prefers current renal providers. Referral will be made to [facility] [place] which is closest clinic to pt's home and will allow pt to keep a [facility] provider. Contacted TOC staff regarding pt's transportation needs to/from HD. Will follow and assist.  [facility] Navigator [phone]

## 2021-08-05 NOTE — Progress Notes (Signed)
Daily Progress Note   Patient Name: Audrey Hill       Date: 08/05/2021 DOB: 03/07/1956  Age: 65 y.o. MRN#: [id number] Attending Physician: Angelica Pou, MD Primary Care Physician: Elizabeth Palau, MD (Inactive) Admit Date: 08/02/2021  Reason for Consultation/Follow-up: Establishing goals of care  Subjective: Medical records reviewed. Patient assessed at the bedside. She is walking back to bed after using the restroom, reports feeling very tired. Her daughter is present at the bedside.  Created space and opportunity for patient's thoughts and feelings on yesterday's conversation, particularly code status. Reviewed the risks and benefits of CPR and the likelihood of causing more harm than benefit, particularly with a partial code and limitation of NO intubation. I shared my worry that this is likely to cause more suffering and that patient's wishes seem to be more aligned with a peaceful and natural death, or DNR order. Reassured that this does not affect any care that patient received and would be in the event that patient's heart and lungs stop despite all medical interventions aimed at prolonging life. Patient's daughter shares that she would prefer patient to be a full code but also respects patient's preferences. She has started having conversations about these topics and shares that patient would desire to be cremated when she passed. Ariela has a difficult time deciding, as she is still interested in CPR without intubation.   An extensive conversation was had, covering concepts specific to code status, artifical feeding and hydration, continued IV antibiotics and rehospitalization. [place] has made the following choices:  Cardiopulmonary Resuscitation: Attempt  Resuscitation (CPR)  Medical Interventions: Limited Additional Interventions: Use medical treatment, medical treatment, IV fluids, and cardiac monitoring as indicated. Do not use intubation or mechanical intubation; also provide comfort measures. Transfer to hospital if indicated. Avoid intensive care.  Antibiotics: Antibiotics if indicated  IV Fluids: IV fluids if indicated  Feeding Tube: No feeding tube    Outpatient palliative care was explained and offered. Referral to psychology for grief counseling was explained and offered.  Questions and concerns addressed. Provided patient's daughter with Hard Choices booklet for review. PMT will continue to support holistically.   Length of Stay: 2  Current Medications: Scheduled Meds:   atorvastatin  20 mg Oral Daily   carvedilol  12.5 mg Oral BID WC   cephALEXin  250 mg Oral Q12H   Chlorhexidine Gluconate Cloth  6 each Topical Q0600   [START ON 08/07/2021] darbepoetin (ARANESP) injection - DIALYSIS  60 mcg Intravenous Q Fri-HD   prochlorperazine  10 mg Intravenous Once   Followed by   diphenhydrAMINE  12.5 mg Intravenous Once   hydrALAZINE  50 mg Oral Q8H   influenza vaccine adjuvanted  0.5 mL Intramuscular Tomorrow-1000   losartan  25 mg Oral Daily   pantoprazole (PROTONIX) IV  40 mg Intravenous Q12H   pneumococcal 23 valent vaccine  0.5 mL Intramuscular Tomorrow-1000   sucralfate  1 g Oral TID WC & HS    Continuous Infusions:  sodium chloride 10 mL/hr at 08/03/21 0139    PRN Meds: sodium chloride, acetaminophen, benzonatate, HYDROmorphone, ramelteon, trimethobenzamide  Physical Exam Vitals and nursing note reviewed.  Constitutional:      General: She is not in acute distress. Cardiovascular:     Rate and Rhythm: Normal rate.  Pulmonary:     Effort: Pulmonary effort is normal.  Skin:    General: Skin is warm and dry.  Neurological:     Mental Status: She is alert and oriented to person, place, and time.            Vital  Signs: BP 139/81 (BP Location: Left Arm)   Pulse 75   Temp 98 F (36.7 C) (Oral)   Resp 19   Wt 62.6 kg   SpO2 96%   BMI 22.28 kg/m  SpO2: SpO2: 96 % O2 Device: O2 Device: Room Air O2 Flow Rate: O2 Flow Rate (L/min): 2 L/min  Intake/output summary:  Intake/Output Summary (Last 24 hours) at 08/05/2021 1759 Last data filed at 08/05/2021 1115 Gross per 24 hour  Intake 555 ml  Output 1000 ml  Net -445 ml   LBM: Last BM Date: 08/02/21 Baseline Weight: Weight: 63.1 kg Most recent weight: Weight: 62.6 kg       Palliative Assessment/Data: 70%      Patient Active Problem List   Diagnosis Date Noted   Hypertensive urgency 08/02/2021   Epigastric pain    Duodenitis 11/14/2014   Hematemesis 11/13/2014   Asthma, chronic 11/13/2014   Nausea vomiting and diarrhea 11/13/2014   History of stroke 11/13/2014   A-fib ([place]) 11/13/2014   AKI (acute kidney injury) ([place]) 11/13/2014   Elevated lipase 11/13/2014   Influenza-like illness 09/29/2013   Goals of care, counseling/discussion 06/18/2013   Hypertension    Hyperlipidemia    GERD (gastroesophageal reflux disease)    Hepatitis     Palliative Care Assessment & Plan   Patient Profile: 65 y.o. 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    Patient arrived in the ED coughing up blood, along with nausea and weakness. CHF exacerbation 2/2 to hypertensive urgency in setting of noncompliance with her medications. She initially declined HD and desired to remain a full code. PMT has been consulted to assist with goals of care conversation.  Assessment: ESRD CHF Goals of care conversation  Recommendations/Plan: Patient desires to remain a PARTIAL code (NO intubation) MOST form completed as above; provided patient with copy of form and placed original  in hard chart. Will scan copy into EMR Spiritual care consult at patient's request Patient and daughter are agreeable to outpatient palliative care referral for ongoing goc discussions, TOC assistance  appreciated Will also refer patient to behavioral medicine for grief and palliative support with Dr. Elias Else  PMT remains available acutely on an as-needed basis. Please secure chat with urgent needs.  Goals of Care and Additional Recommendations: Limitations on Scope of Treatment: No Artificial Feeding and No Tracheostomy   Prognosis:  Unable to determine  Discharge Planning: Home with Palliative Services  Care plan was discussed with patient' patient's daughter  Total time: 45 minutes Greater than 50% of this time was spent in counseling and coordinating care related to the above assessment and plan.  Dorthy Cooler, PA-C Palliative Medicine Team Team phone # [phone]  Thank you for allowing the Palliative Medicine Team to assist in the care of this patient. Please utilize secure chat with additional questions, if there is no response within 30 minutes please call the above phone number.  Palliative Medicine Team providers are available by phone from 7am to 7pm daily and can be reached through the team cell phone.  Should this patient require assistance outside of these hours, please call the patient's attending physician.

## 2021-08-05 NOTE — Progress Notes (Signed)
Round on patient today secondary to necessity to start HD. Patient is in and out of sleep at this time. Handouts and Kidney Failure book left with patient. Plan to re-round on patient tomorrow for further questions. Will follow as appropriate.  Dorthey Sawyer, RN  Dialysis Nurse Coordinator [phone]

## 2021-08-06 LAB — MAGNESIUM: Magnesium: 2.4 mg/dL (ref 1.7–2.4)

## 2021-08-06 LAB — TYPE AND SCREEN
ABO/RH(D): O POS
Antibody Screen: NEGATIVE
Unit division: 0

## 2021-08-06 LAB — COMPREHENSIVE METABOLIC PANEL
ALT: 12 U/L (ref 0–44)
AST: 17 U/L (ref 15–41)
Albumin: 2.8 g/dL — ABNORMAL LOW (ref 3.5–5.0)
Alkaline Phosphatase: 69 U/L (ref 38–126)
Anion gap: 6 (ref 5–15)
BUN: 28 mg/dL — ABNORMAL HIGH (ref 8–23)
CO2: 26 mmol/L (ref 22–32)
Calcium: 8.6 mg/dL — ABNORMAL LOW (ref 8.9–10.3)
Chloride: 105 mmol/L (ref 98–111)
Creatinine, Ser: 3.38 mg/dL — ABNORMAL HIGH (ref 0.44–1.00)
GFR, Estimated: 14 mL/min — ABNORMAL LOW (ref >60)
Glucose, Bld: 110 mg/dL — ABNORMAL HIGH (ref 70–99)
Potassium: 4.1 mmol/L (ref 3.5–5.1)
Sodium: 137 mmol/L (ref 135–145)
Total Bilirubin: 0.7 mg/dL (ref 0.3–1.2)
Total Protein: 5.5 g/dL — ABNORMAL LOW (ref 6.5–8.1)

## 2021-08-06 LAB — CBC
HCT: 24.1 % — ABNORMAL LOW (ref 36.0–46.0)
Hemoglobin: 7.7 g/dL — ABNORMAL LOW (ref 12.0–15.0)
MCH: 27.2 pg (ref 26.0–34.0)
MCHC: 32 g/dL (ref 30.0–36.0)
MCV: 85.2 fL (ref 80.0–100.0)
Platelets: 175 10*3/uL (ref 150–400)
RBC: 2.83 MIL/uL — ABNORMAL LOW (ref 3.87–5.11)
RDW: 13.7 % (ref 11.5–15.5)
WBC: 4.9 10*3/uL (ref 4.0–10.5)
nRBC: 0 % (ref 0.0–0.2)

## 2021-08-06 LAB — BPAM RBC
Blood Product Expiration Date: 202211272359
ISSUE DATE / TIME: 202211020623
Unit Type and Rh: 5100

## 2021-08-06 MED ORDER — DARBEPOETIN ALFA 60 MCG/0.3ML IJ SOSY
60.0000 ug | PREFILLED_SYRINGE | INTRAMUSCULAR | Status: DC
Start: 2021-08-08 — End: 2021-08-10
  Administered 2021-08-08: 60 ug via INTRAVENOUS
  Filled 2021-08-06 (×2): qty 0.3

## 2021-08-06 MED ORDER — SENNA 8.6 MG PO TABS
1.0000 | ORAL_TABLET | Freq: Every day | ORAL | Status: DC
  Administered 2021-08-10: 8.6 mg via ORAL
  Filled 2021-08-06: qty 1

## 2021-08-06 MED ORDER — POLYETHYLENE GLYCOL 3350 17 G PO PACK: 17.0000 g | PACK | Freq: Every day | ORAL | Status: DC | PRN

## 2021-08-06 NOTE — Progress Notes (Signed)
Initial Nutrition Assessment  DOCUMENTATION CODES:   Non-severe (moderate) malnutrition in context of chronic illness  INTERVENTION:   Renal Multivitamin w/ minerals daily  When diet is appropriate provide Ensure Enlive po BID, each supplement provides 350 kcal and 20 grams of protein  Advance diet as medially appropriate per MD Encourage good PO intake  Diet Education for new HD   NUTRITION DIAGNOSIS:   Moderate Malnutrition related to chronic illness (CHF, ESRD) as evidenced by severe muscle depletion, moderate muscle depletion.  GOAL:   Patient will meet greater than or equal to 90% of their needs  MONITOR:   PO intake, Labs, Supplement acceptance, I & O's  REASON FOR ASSESSMENT:   Consult Diet education  ASSESSMENT:   65 y.o. female presented to the ED with epigastric pain radiating into chest, N/V, and SOB. PMH includes HTN, A. Fib, CHF, polysubstance abuse, and GERD. Pt admitted with CHF, HTN, CKD IV, and N/V.   10/30: Heart Healthy diet 11/04: NPO; CLD  Per Nephrology consult, pt previously was CKD V and is now ESRD. Pt was hesitant to start HD and still hesitant about fistula placement.  Pt reports that her appetite has been terrible for about 7-8 days prior to admission. Reports that it is slowly getting better but is not fully back. Pt reports a typical intake of: Breakfast: cereal, Lunch: sandwich and soup; Dinner: nothing.    Pt is unsure of her UBW, but was able to state that she has lost a lot of weight. Weight loss has occurred since the passing of her brother in August. No recent weight history per EMR or Care Everywhere. Pt reports that sometimes she uses a walker or cane.   PLDN received a consult for diet education; provided pt with multiple handouts for nutrition therapy with people on dialysis. PLDN unable to go over handout with pt due to vascular team being in room to talk to her. Told pt that if she has any question that she can let the nurse  know to contact me and that she will be seeing a dietitian regularly at dialysis.  Pt went for an esophogram and showed mild-moderate esophageal dysmotility w/ narrowing. Plan EGD tomorrow with possible dilation.  Discussed ONS with pt; pt agreeable to Ensure when diet is appropriate.  Medications reviewed and include: Keflex, Aranesp, Protonix, Senokot, Carafate Labs reviewed.   Admission Weight: 63.1 kg Current Weight: 62.6 kg  HD on 11/2 Net UF: 1000 mL  NUTRITION - FOCUSED PHYSICAL EXAM:  Flowsheet Row Most Recent Value  Orbital Region Moderate depletion  Upper Arm Region Mild depletion  Thoracic and Lumbar Region Mild depletion  Buccal Region Moderate depletion  Temple Region Moderate depletion  Clavicle Bone Region Moderate depletion  Clavicle and Acromion Bone Region Moderate depletion  Scapular Bone Region Moderate depletion  Dorsal Hand Moderate depletion  Patellar Region Severe depletion  Anterior Thigh Region Severe depletion  Posterior Calf Region Severe depletion  Edema (RD Assessment) None  Hair Reviewed  Eyes Reviewed  Mouth Reviewed  Skin Reviewed  Nails Reviewed  [Poor capillary refill]       Diet Order:   Diet Order             Diet NPO time specified  Diet effective midnight           Diet clear liquid Room service appropriate? Yes; Fluid consistency: Thin  Diet effective now  EDUCATION NEEDS:   Education needs have been addressed  Skin:  Skin Assessment: Reviewed RN Assessment  Last BM:  08/06/2021  Height:   Ht Readings from Last 1 Encounters:  08/07/21 5\' 6"  (1.676 m)    Weight:   Wt Readings from Last 1 Encounters:  08/07/21 62.6 kg    Ideal Body Weight:  59.1 kg  BMI:  Body mass index is 22.28 kg/m.  Estimated Nutritional Needs:   Kcal:  1900-2100  Protein:  95-110 grams  Fluid:  UOP + 1 L    Omunique Pederson BS, PLDN Clinical Dietitian See AMiON for contact information.

## 2021-08-06 NOTE — Progress Notes (Signed)
Nephrology Follow-Up Consult note   Assessment/Recommendations: Audrey Hill is a/an 65 y.o. 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  She was hesitant to start dialysis but accepting now. She has had issues with compliance but because she remains fairly functional she is a suitable candidate for dialysis -Appreciate TDC placement by IR; bleeding has stopped -She will be [facility] outpatient [facility] -Next HD on saturday and then likely stable for DC from nephrology stand point -Will continue discussion regarding fistula placement; patient is fairly hesitant at this time   Epigastric Pain/Nausea and Vomiting: being treated for reflux per primary. Uremia likely playing some role in nausea. Persistent symptoms today   Hypertension: Blood pressure continues to be slightly high but overall improved.  Continue current medications.  We started losartan.   Afib: meds per primary   Anemia of CKD: also blood loss possible. Maybe PUD with epigastric pain. Iron depleted.  Required transfusion on 11/2.  Continue aranesp 39mcg.  Secondary Hyperparathyroidism: PTH 281. Calcium and phos acceptable. No treatment   Recommendations conveyed to primary service.    [facility] 08/06/2021 12:20 PM  ___________________________________________________________  CC: ESRD  Interval History/Subjective: Patient feels well today with no complaints.  Tolerated dialysis   Medications:  Current Facility-Administered Medications  Medication Dose Route Frequency Provider Last Rate Last Admin   0.9 %  sodium chloride infusion   Intravenous PRN Angelica Pou, MD 10 mL/hr at 08/03/21 0139 New Bag at 08/03/21 0139    acetaminophen (TYLENOL) tablet 650 mg  650 mg Oral Q6H PRN Sanjuan Dame, MD       atorvastatin (LIPITOR) tablet 20 mg  20 mg Oral Daily Idamae Schuller, MD   20 mg at 08/06/21 0817   benzonatate (TESSALON) capsule 100 mg  100 mg Oral TID PRN Sanjuan Dame, MD   100 mg at 08/04/21 1113   carvedilol (COREG) tablet 12.5 mg  12.5 mg Oral BID WC Demaio, Alexa, MD   12.5 mg at 08/06/21 0817   cephALEXin (KEFLEX) capsule 250 mg  250 mg Oral Q12H [place], Rayann N, DO   250 mg at 08/06/21 0818   Chlorhexidine Gluconate Cloth 2 % PADS 6 each  6 each Topical Q0600 Reesa Chew, MD   6 each at 08/06/21 0510   [START ON 08/07/2021] Darbepoetin Alfa (ARANESP) injection 60 mcg  60 mcg Intravenous Q Fri-HD Reesa Chew, MD       prochlorperazine (COMPAZINE) injection 10 mg  10 mg Intravenous Once Idamae Schuller, MD       Followed by   diphenhydrAMINE (BENADRYL) injection 12.5 mg  12.5 mg Intravenous Once Idamae Schuller, MD       hydrALAZINE (APRESOLINE) tablet 50 mg  50 mg Oral Q8H Demaio, Alexa, MD   50 mg at 08/06/21 0427   HYDROmorphone (DILAUDID) tablet 1 mg  1 mg Oral Q4H PRN Demaio, Alexa, MD   1 mg at 08/06/21 0427   influenza vaccine adjuvanted (FLUAD) injection 0.5 mL  0.5 mL Intramuscular Tomorrow-1000 Angelica Pou, MD       losartan (COZAAR) tablet 25 mg  25 mg Oral Daily Reesa Chew, MD   25 mg at 08/06/21 0816   pantoprazole ([place])  injection 40 mg  40 mg Intravenous Q12H Lacinda Axon, MD   40 mg at 08/06/21 0818   pneumococcal 23 valent vaccine (PNEUMOVAX-23) injection 0.5 mL  0.5 mL Intramuscular Tomorrow-1000 Angelica Pou, MD       polyethylene glycol (MIRALAX / GLYCOLAX) packet 17 g  17 g Oral Daily PRN Demaio, Alexa, MD       ramelteon (ROZEREM) tablet 8 mg  8 mg Oral QHS PRN Sanjuan Dame, MD   8 mg at 08/05/21 2048   senna (SENOKOT) tablet 8.6 mg  1 tablet Oral Daily Demaio, Alexa, MD       sucralfate (CARAFATE) 1 GM/10ML suspension 1 g  1 g Oral  TID WC & HS Idamae Schuller, MD   1 g at 08/06/21 0644   trimethobenzamide (TIGAN) capsule 300 mg  300 mg Oral Q8H PRN Idamae Schuller, MD          Review of Systems: 10 systems reviewed and negative except per interval history/subjective  Physical Exam: Vitals:   08/06/21 0817 08/06/21 1106  BP: (!) 165/96 (!) 154/82  Pulse: 80 77  Resp:  18  Temp:  97.9 F (36.6 C)  SpO2:     No intake/output data recorded.  Intake/Output Summary (Last 24 hours) at 08/06/2021 1220 Last data filed at 08/06/2021 0600 Gross per 24 hour  Intake 360 ml  Output 700 ml  Net -340 ml   Constitutional: Lying in bed, no concerns ENMT: ears and nose without scars or lesions, MMM, poor dentition CV: normal rate, no edema Chest: [facility] in place with no bleeding Respiratory: Bilateral chest rise, normal work of breathing Gastrointestinal: soft, non-tender, no palpable masses or hernias Skin: no visible lesions or rashes Psych: alert, judgement/insight appropriate, appropriate mood and affect   Test Results I personally reviewed new and old clinical labs and radiology tests Lab Results  Component Value Date   NA 137 08/06/2021   K 4.1 08/06/2021   CL 105 08/06/2021   CO2 26 08/06/2021   BUN 28 (H) 08/06/2021   CREATININE 3.38 (H) 08/06/2021   CALCIUM 8.6 (L) 08/06/2021   ALBUMIN 2.8 (L) 08/06/2021   PHOS 4.4 08/02/2021

## 2021-08-06 NOTE — Discharge Summary (Signed)
Name: Audrey Hill MRN: [id number] DOB: 11-16-1955 65 y.o. PCP: Elizabeth Palau, MD (Inactive)  Date of Admission: 08/02/2021 11:36 AM Date of Discharge: 08/11/21 Attending Physician: Angelica Pou, MD  Discharge Diagnosis: 1. Hypertensive Urgency 2. 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 Chronic gastritis 5. Atrial Fibrillation 6. Hyperlipidemia 7. Anemia  Discharge Medications: Allergies as of 08/12/2021       Reactions   Lisinopril Rash, Swelling   Metoclopramide    Other reaction(s): Other Patient has dystonia with administration of drug.  Other reaction(s): Other (See Comments) Patient has dystonia with administration of drug.  Patient has dystonia with administration of drug.    Nortriptyline    Other reaction(s): Other (See Comments) unknown "Mini stroke"   Tramadol Nausea And Vomiting, Other (See Comments)   Unknown Unknown   Acetaminophen Rash, Other (See Comments)   .   Nitroglycerin Hives, Rash   Ibuprofen Other (See Comments)   Upset gi   Norvasc [amlodipine Besylate] Swelling        Medication List     TAKE these medications    acetaminophen 325 MG tablet Commonly known as: TYLENOL Take 2 tablets (650 mg total) by mouth every 6 (six) hours as needed for headache, moderate pain or mild pain.   apixaban 2.5 MG Tabs tablet Commonly known as: Eliquis Take 1 tablet (2.5 mg total) by mouth 2 (two) times daily.   atorvastatin 20 MG tablet Commonly known as: LIPITOR Take 1 tablet (20 mg total) by mouth daily.   carvedilol 12.5 MG tablet Commonly known as: COREG Take 1 tablet (12.5 mg total) by mouth 2 (two) times daily with a meal.   Darbepoetin Alfa 100 MCG/0.5ML Sosy injection Commonly known as: ARANESP Inject 0.5 mLs (100 mcg total) into the vein every Saturday with hemodialysis. Start taking on: August 15, 2021   hydrALAZINE 25 MG tablet Commonly known as: APRESOLINE Take 1 tablet (25 mg  total) by mouth every 8 (eight) hours.   lidocaine 5 % Commonly known as: LIDODERM Place 1 patch onto the skin daily. Remove & Discard patch within 12 hours or as directed by MD Start taking on: August 13, 2021   losartan 50 MG tablet Commonly known as: COZAAR Take 1 tablet (50 mg total) by mouth daily.   multivitamin Tabs tablet Take 1 tablet by mouth at bedtime.   oxyCODONE-acetaminophen 5-325 MG tablet Commonly known as: Percocet Take 1 tablet by mouth every 4 (four) hours as needed for up to 3 days for severe pain.   pantoprazole 40 MG tablet Commonly known as: PROTONIX Take 1 tablet (40 mg total) by mouth 2 (two) times daily.   ramelteon 8 MG tablet Commonly known as: ROZEREM Take 1 tablet (8 mg total) by mouth at bedtime as needed (insomnia).               Discharge Care Instructions  (From admission, onward)           Start     Ordered   08/12/21 0000  Leave dressing on - Keep it clean, dry, and intact until clinic visit        08/12/21 1247   08/12/21 0000  Discharge wound care:       Comments: See instructions on AVS   08/12/21 1247            Disposition and follow-up:   Audrey Hill was discharged from [facility] in Stable condition. At  the hospital follow up visit please address:  1.  Hypertension- patient initially non-adherent to medications as an outpatient as she ran out of her medications. Reassess her how BP is doing in the outpatient setting with her new regimen.   2.  ESRD now on HD TTS- Patient initially hesitant but eventually agreed to dialysis, discuss how patient is tolerating dialysis now that she is had several sessions.  3.  CHF- Concern for amyloidosis. Ensure patient has outpatient cardiology appointment scheduled  4. Gastritis-Found on EGD during admission. Symptoms improved with PPI.  Follow up to see how PPI is helping her pain  5. Paroxysmal atrial fibrillation- Eliquis was held in the  setting of concern for bleed, ensure patient restarted this medication.   6.  Labs / imaging needed at time of follow-up: CBC, BMP  7.  Pending labs/ test needing follow-up: Surgical pathology from EGD biopsy  Follow-up Appointments:  Follow-up Information     [facility] Follow up.   Why: Schedule is Tuesday, Thursday, Saturday Arrive at 6:00 for 6:20 chair time. Contact information: [address] [place] [address] [phone]         [facility] Follow up.   Specialty: PALLIATIVE CARE Why: Outpatient Palliative Services-Office to call the patient once she gets home for visit times. Contact information: [place] [address] [phone]        [facility] Follow up.   Specialty: [place] Why: the office will call to schedule home health visits - please allow 48 hours after discharge for follow up Contact information: [place] [address] [phone]         [facility] Follow up.   Contact information: [address] [place] Hospital Course by problem list: 1. 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 Initial BP were as high as the 694W systolic. She was experiencing a headache which was thought to be partially related to her high blood pressure. Her BP was slowly decreased with medications as well as through hemodialysis. Her blood pressure medications were titrated and her final regimen was losartan 50 daily, hydralazine 25 TID, and coreg 12.5 BID. BP at discharge was 134/88.  2. ESRD on TTS HD [place] Platelet dysfunction Patient initially presented with CKD5 and was hesitant to start dialysis but ultimately decided to pursue HD. Patient underwent tunneled catheter  placement. After this procedure patient had a difficulty clotting which was thought to be secondary to high venous pressures and uremic platelet dysfunction. Patient was given 160 lasix and 20 DDAVP and bleeding was controlled. First dialysis was done on 11/1 and then again on 11/2. Permanent access was obtained on 11/8 with [facility] and patient will continue with dialysis as an outpatient. Patient will be going to dialysis on Tuesdays, Thursdays and Saturdays at [facility].   3. Combined systolic and diastolic heart failure (EF 30-35%) Volume overload Concern for cardiac amyloidosis. Patient presented with CP and BNP was elevated to 2292 on 08/02/21 with diffuse edema on CXR and bilateral rales. Troponins were negative and EKG was normal and patient was hemodynamically stable. Echo was obtained showing EF of 30 to 35 %, LV cavity was dilated with Grade III diastolic dysfunction (restrictive), severe  elevated pulmonary artery systolic pressures, severe biatrial enlargement, and the presence of pericardial effusion. Cardiology was consulted and outpatient follow up will be scheduled with the daughter as transportation.   4. Gastritis Patient was complaining of diffuse abdominal pain not relived by IV PPI, sulcrafate, or maalox. Even with the addition of oral dilaudid 1mg  Q4 patient was still in significant pain and discomfort. Esophogram was obtained with oral contrast which showed concern for esophageal dysmotility and mild narrowing. Patient then had EGD with GI who found gastritis. Recommended outpatient follow up for further work-up of dysmotility disorder, has an appointment scheduled for 12/8 with [place].  5. Anemia Patient came in with an initial Hgb of 8, thought to be related to AoCD  from ESRD. On 11/2 patient's hemoglobin dropped to 6.3 and she received 1 unit of pRBCs. Her hemoglobin responded appropriately to 7.6. Her hemoglobin slowly decreased and she received another unit  on 11/8. Nephrology also oversaw the addition of aranest during dialysis. Hemoglobin improved to 8.6 on day of discharge.   6. Atrial fibrillation Eliquis was initially held due to concern for bleed.  Patient was in and out of A. fib with heart rate in the 60s to 90s throughout hospitalization.  Eliquis was restarted on discharge.  7. Hyperlipidemia Home atorvastatin 20 daily was continued.   8. Left chest/side pain While planning for discharge on 11/08, patient reported an episode of left chest, arm and side pain. The pain was dull in nature and radiated to her left arm and back. Given ASA x1 dose as patient reported she gets an allergic reaction from Nitro. Pain was thought to be due to muscle pain following the fistula placement in her left arm that morning. EKG was unremarkable but troponins were slightly elevated so patient was kept overnight. Troponins trended down and patient was discharged the next day on 11/09.   Discharge Exam:   BP 131/79   Pulse 82   Temp 98.1 F (36.7 C) (Oral)   Resp 17   Ht 5\' 6"  (1.676 m)   Wt 63.6 kg   SpO2 98%   BMI 22.63 kg/m  Discharge exam:  Constitutional: elderly woman sleeping in bed in no acute distress HENT: normocephalic atraumatic, mucous membranes moist Eyes: conjunctiva non-erythematous Neck: supple Cardiovascular: regular rate and rhythm Pulmonary/Chest: CTAB. Normal work of breathing on room air lungs. Abdominal: soft, non-distended, nontender  MSK: normal bulk and tone Neurological: alert & answering questions appropriately Skin: warm and dry, left arm bandage c/d/I, no erythema. Mild tenderness around the site. A few scratch marks on her back Psych: normal affect  Pertinent Labs, Studies, and Procedures:  CT ABDOMEN PELVIS WO CONTRAST  Result Date: 08/02/2021 CLINICAL DATA:  Abdominal pain. EXAM: CT ABDOMEN AND PELVIS WITHOUT CONTRAST TECHNIQUE: Multidetector CT imaging of the abdomen and pelvis was performed following the  standard protocol without IV contrast. COMPARISON:  CT scan 09/15/2019 FINDINGS: Lower chest: Bilateral pleural effusions and bibasilar atelectasis. Moderate cardiac enlargement. No pericardial effusion. Hepatobiliary: No obvious hepatic lesions without contrast. No intrahepatic biliary dilatation. The gallbladder is grossly normal. No common bile duct dilatation. Pancreas: No obvious mass, inflammation or ductal dilatation. Spleen: Normal size.  No focal lesions. Adrenals/Urinary Tract: Adrenal glands and kidneys are grossly normal. Stable large left renal cyst. The bladder is grossly normal. Stomach/Bowel: The stomach, duodenum, small bowel and colon are grossly. Vascular/Lymphatic: The aorta is normal in caliber. No atheroscerlotic calcifications. No mesenteric of retroperitoneal mass or adenopathy. Small scattered lymph nodes are  noted. Reproductive: Surgically absent. Other: No pelvic mass or adenopathy. No free pelvic fluid collections. No inguinal mass or adenopathy. No abdominal wall hernia or subcutaneous lesions. Musculoskeletal: No significant bony findings. IMPRESSION: 1. Limited examination without IV and oral contrast. No gross acute abdominal/pelvic findings, mass lesions or adenopathy. 2. Bilateral pleural effusions and bibasilar atelectasis. 3. Moderate cardiac enlargement. Electronically Signed   By: Marijo Sanes M.D.   On: 08/02/2021 18:03   DG Chest 2 View  Result Date: 08/02/2021 CLINICAL DATA:  Chest pain. Additional history provided: Patient reports "coughing up blood," nausea, generalized weakness, chest pain, symptoms for 3-4 days. EXAM: CHEST - 2 VIEW COMPARISON:  Prior chest radiographs 11/03/2015. FINDINGS: Cardiomegaly, progressed as compared to the prior examination of 11/03/2015. New from this prior exam, there is notable asymmetric prominence of the right hilum. Central pulmonary vascular congestion. Patchy perihilar opacities. No evidence of pleural effusion or pneumothorax. No  acute bony abnormality identified. Tiny metallic foreign bodies project in the region of the left lower neck/upper left chest. IMPRESSION: Cardiomegaly, progressed from the prior chest radiographs of 11/03/2015. Associated pulmonary vascular congestion. New from the prior exam, there is notable asymmetric prominence of the right hilum. This may reflect asymmetric prominence of the pulmonary arteries. However, a contrast-enhanced chest CT is recommended to exclude a hilar mass (particularly given the provided history of hemoptysis). Patchy perihilar opacities bilaterally, nonspecific but favored to reflect pulmonary edema. Atypical/viral pneumonia cannot be excluded. Electronically Signed   By: Kellie Simmering D.O.   On: 08/02/2021 13:00   CT Chest Wo Contrast  Result Date: 08/02/2021 CLINICAL DATA:  Chest pain, cough. EXAM: CT CHEST WITHOUT CONTRAST TECHNIQUE: Multidetector CT imaging of the chest was performed following the standard protocol without IV contrast. COMPARISON:  March 17, 2019. FINDINGS: Cardiovascular: Moderate cardiomegaly is noted. No evidence of thoracic aortic aneurysm is noted. No pericardial effusion is noted. Mediastinum/Nodes: No enlarged mediastinal or axillary lymph nodes. Thyroid gland, trachea, and esophagus demonstrate no significant findings. Lungs/Pleura: No pneumothorax is noted. Minimal bilateral pleural effusions are noted. Minimal bibasilar interstitial densities are noted concerning for pulmonary edema. Septal thickening is also noted in both lung apices concerning for edema. Upper Abdomen: No acute abnormality. Musculoskeletal: No chest wall mass or suspicious bone lesions identified. IMPRESSION: Moderate cardiomegaly. Minimal bibasilar interstitial densities are noted concerning for pulmonary edema, as well as probable bilateral upper lobe edema. Minimal bilateral pleural effusions are noted. Electronically Signed   By: Marijo Conception M.D.   On: 08/02/2021 15:40   IR Fluoro  Guide CV Line Right  Result Date: 08/04/2021 INDICATION: Renal failure EXAM: Ultrasound and fluoroscopy guided tunneled dialysis catheter placement MEDICATIONS: Ancef 2 g IV; The antibiotic was administered within an appropriate time interval prior to skin puncture. ANESTHESIA/SEDATION: Moderate (conscious) sedation was employed during this procedure. A total of Versed 1 mg and Fentanyl 25 mcg was administered intravenously by the radiology nurse. Total intra-service moderate Sedation Time: 10 minutes. The patient's level of consciousness and vital signs were monitored continuously by radiology nursing throughout the procedure under my direct supervision. FLUOROSCOPY TIME:  Fluoroscopy Time: 1 minutes 0 seconds (2.8 mGy). COMPLICATIONS: None immediate. PROCEDURE: Informed written consent was obtained from the patient after a thorough discussion of the procedural risks, benefits and alternatives. All questions were addressed. Maximal Sterile Barrier Technique was utilized including caps, mask, sterile gowns, sterile gloves, sterile drape, hand hygiene and skin antiseptic. A timeout was performed prior to the initiation of the procedure. The right  internal jugular vein was evaluated with ultrasound and shown to be patent. A permanent ultrasound image was obtained and placed in the patient's medical record. Using sterile gel and a sterile probe cover, the right internal jugular vein was entered with a 21 ga needle during real time ultrasound guidance. 0.018 inch guidewire placed and 21 ga needle exchanged for transitional dilator set. Utilizing fluoroscopy, 0.035 inch guidewire advanced through the needle without difficulty. Seriel dilation was performed and peel-away sheath was placed. Attention then turned to the right anterior upper chest. Following local lidocaine administration, the hemodialysis catheter was tunneled from the chest wall to the venotomy site. The catheter was inserted through the peel-away  sheath. The tip of the catheter was positioned within the right atrium using fluoroscopic guidance. All lumens of the catheter aspirated and flushed well. The dialysis lumens were locked with Heparin. The catheter was secured to the skin with suture. The insertion site was covered with sterile dressing. IMPRESSION: 19 cm right IJ tunneled hemodialysis catheter is ready for use. Electronically Signed   By: Miachel Roux M.D.   On: 08/04/2021 11:25   IR US Guide Vasc Access Right  Result Date: 08/04/2021 INDICATION: Renal failure EXAM: Ultrasound and fluoroscopy guided tunneled dialysis catheter placement MEDICATIONS: Ancef 2 g IV; The antibiotic was administered within an appropriate time interval prior to skin puncture. ANESTHESIA/SEDATION: Moderate (conscious) sedation was employed during this procedure. A total of Versed 1 mg and Fentanyl 25 mcg was administered intravenously by the radiology nurse. Total intra-service moderate Sedation Time: 10 minutes. The patient's level of consciousness and vital signs were monitored continuously by radiology nursing throughout the procedure under my direct supervision. FLUOROSCOPY TIME:  Fluoroscopy Time: 1 minutes 0 seconds (2.8 mGy). COMPLICATIONS: None immediate. PROCEDURE: Informed written consent was obtained from the patient after a thorough discussion of the procedural risks, benefits and alternatives. All questions were addressed. Maximal Sterile Barrier Technique was utilized including caps, mask, sterile gowns, sterile gloves, sterile drape, hand hygiene and skin antiseptic. A timeout was performed prior to the initiation of the procedure. The right internal jugular vein was evaluated with ultrasound and shown to be patent. A permanent ultrasound image was obtained and placed in the patient's medical record. Using sterile gel and a sterile probe cover, the right internal jugular vein was entered with a 21 ga needle during real time ultrasound guidance. 0.018 inch  guidewire placed and 21 ga needle exchanged for transitional dilator set. Utilizing fluoroscopy, 0.035 inch guidewire advanced through the needle without difficulty. Seriel dilation was performed and peel-away sheath was placed. Attention then turned to the right anterior upper chest. Following local lidocaine administration, the hemodialysis catheter was tunneled from the chest wall to the venotomy site. The catheter was inserted through the peel-away sheath. The tip of the catheter was positioned within the right atrium using fluoroscopic guidance. All lumens of the catheter aspirated and flushed well. The dialysis lumens were locked with Heparin. The catheter was secured to the skin with suture. The insertion site was covered with sterile dressing. IMPRESSION: 19 cm right IJ tunneled hemodialysis catheter is ready for use. Electronically Signed   By: Miachel Roux M.D.   On: 08/04/2021 11:25   ECHOCARDIOGRAM COMPLETE  Result Date: 08/03/2021    ECHOCARDIOGRAM REPORT   Patient Name:   Audrey Hill Date of Exam: 08/03/2021 Medical Rec #:  [id number]           Height:       66.0 in Accession #:  [id number]          Weight:       139.2 lb Date of Birth:  [birth year]          BSA:          1.714 m Patient Age:    69 years            BP:           160/97 mmHg Patient Gender: F                   HR:           88 bpm. Exam Location:  Inpatient Procedure: 2D Echo, Cardiac Doppler, Color Doppler and Intracardiac            Opacification Agent Indications:    R07.9* Chest pain, unspecified  History:        Patient has no prior history of Echocardiogram examinations.                 Signs/Symptoms:Chest Pain.  Sonographer:    Glo Herring Referring Phys: [place]  1. Left ventricular ejection fraction, by estimation, is 30 to 35%. The left ventricle has moderately decreased function. The left ventricle demonstrates global hypokinesis. The left ventricular internal cavity size was  mildly dilated. There is severe concentric left ventricular hypertrophy. Left ventricular diastolic parameters are consistent with Grade III diastolic dysfunction (restrictive). Elevated left ventricular end-diastolic pressure. The average left ventricular global longitudinal strain is  -8.1 %. The global longitudinal strain is abnormal.  2. Right ventricular systolic function is normal. The right ventricular size is normal. Mildly increased right ventricular wall thickness. There is severely elevated pulmonary artery systolic pressure. The estimated right ventricular systolic pressure is 08.1 mmHg.  3. Left atrial size was severely dilated.  4. Right atrial size was severely dilated.  5. The pericardial effusion is circumferential.  6. The mitral valve is degenerative. Mild to moderate mitral valve regurgitation. No evidence of mitral stenosis.  7. The aortic valve is tricuspid. Aortic valve regurgitation is not visualized. Mild aortic valve sclerosis is present, with no evidence of aortic valve stenosis. Aortic valve mean gradient measures 5.0 mmHg. Aortic valve Vmax measures 1.69 m/s.  8. The inferior vena cava is dilated in size with <50% respiratory variability, suggesting right atrial pressure of 15 mmHg.  9. 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 Consider PYP scan or cMRI. FINDINGS  Left Ventricle: Left ventricular ejection fraction, by estimation, is 30 to 35%. The left ventricle has moderately decreased function. The left ventricle demonstrates global hypokinesis. The average left ventricular global longitudinal strain is -8.1 %.  The global longitudinal strain is abnormal. The left ventricular internal cavity size was mildly dilated. There is severe concentric left ventricular hypertrophy. Left ventricular diastolic  parameters are consistent with Grade III diastolic dysfunction (restrictive). Elevated left ventricular end-diastolic pressure. Right Ventricle: The right ventricular size is normal. Mildly increased right ventricular wall thickness. Right ventricular systolic function is normal. There is severely elevated pulmonary artery systolic pressure. The tricuspid regurgitant velocity is 3.83 m/s, and with an assumed right atrial pressure of 15 mmHg, the estimated right ventricular systolic pressure is 44.8 mmHg. Left Atrium: Left atrial size was severely dilated. Right Atrium: Right atrial size was severely dilated. Pericardium: Trivial pericardial effusion is present. The  pericardial effusion is circumferential. Mitral Valve: The mitral valve is degenerative in appearance. There is mild thickening of the mitral valve leaflet(s). Mild to moderate mitral valve regurgitation. No evidence of mitral valve stenosis. Tricuspid Valve: The tricuspid valve is normal in structure. Tricuspid valve regurgitation is mild . No evidence of tricuspid stenosis. Aortic Valve: The aortic valve is tricuspid. Aortic valve regurgitation is not visualized. Mild aortic valve sclerosis is present, with no evidence of aortic valve stenosis. Aortic valve mean gradient measures 5.0 mmHg. Aortic valve peak gradient measures 11.4 mmHg. Pulmonic Valve: The pulmonic valve was normal in structure. Pulmonic valve regurgitation is trivial. No evidence of pulmonic stenosis. Aorta: The aortic root is normal in size and structure. Venous: The inferior vena cava is dilated in size with less than 50% respiratory variability, suggesting right atrial pressure of 15 mmHg. IAS/Shunts: No atrial level shunt detected by color flow Doppler.  LEFT VENTRICLE PLAX 2D LVIDd:         5.40 cm      Diastology LVIDs:         4.40 cm      LV e' medial:    4.79 cm/s LV PW:         1.90 cm      LV E/e' medial:  29.4 LV IVS:        1.90 cm      LV e' lateral:   6.96 cm/s                              LV E/e' lateral: 20.3  LV Volumes (MOD)            2D Longitudinal Strain LV vol d, MOD A2C: 228.0 ml 2D Strain GLS (A2C):   -8.4 % LV vol d, MOD A4C: 246.0 ml 2D Strain GLS (A3C):   -8.9 % LV vol s, MOD A2C: 154.0 ml 2D Strain GLS (A4C):   -7.1 % LV vol s, MOD A4C: 168.0 ml 2D Strain GLS Avg:     -8.1 % LV SV MOD A2C:     74.0 ml LV SV MOD A4C:     246.0 ml LV SV MOD BP:      76.8 ml RIGHT VENTRICLE            IVC RV Basal diam:  3.90 cm    IVC diam: 2.90 cm RV S prime:     9.14 cm/s LEFT ATRIUM              Index        RIGHT ATRIUM           Index LA diam:        6.50 cm  3.79 cm/m   RA Area:     27.80 cm LA Vol (A2C):   132.0 ml 77.00 ml/m  RA Volume:   95.70 ml  55.82 ml/m LA Vol (A4C):   103.0 ml 60.08 ml/m LA Biplane Vol: 116.0 ml 67.66 ml/m  AORTIC VALVE                    PULMONIC VALVE AV Vmax:           169.00 cm/s  PV Vmax:       0.88 m/s AV Vmean:          108.000 cm/s PV Peak grad:  3.1 mmHg AV VTI:  0.264 m AV Peak Grad:      11.4 mmHg AV Mean Grad:      5.0 mmHg LVOT Vmax:         110.00 cm/s LVOT Vmean:        70.300 cm/s LVOT VTI:          0.160 m LVOT/AV VTI ratio: 0.61  AORTA Ao Root diam: 3.20 cm Ao Asc diam:  3.20 cm MITRAL VALVE                TRICUSPID VALVE MV Area (PHT): 6.90 cm     TR Peak grad:   58.7 mmHg MV Decel Time: 110 msec     TR Vmax:        383.00 cm/s MV E velocity: 141.00 cm/s MV A velocity: 50.80 cm/s   SHUNTS MV E/A ratio:  2.78         Systemic VTI: 0.16 m Fransico Him MD Electronically signed by Fransico Him MD Signature Date/Time: 08/03/2021/11:12:00 AM    Final    VAS [place] UPPER EXT VEIN MAPPING (PRE-OP AVF)  Result Date: 08/07/2021 UPPER EXTREMITY VEIN MAPPING Patient Name:  Audrey Hill  Date of Exam:   08/07/2021 Medical Rec #: [id number]            Accession #:    [id number] Date of Birth: 27-Jun-1956           Patient Gender: F Patient Age:   7 years Exam Location:  [facility] Procedure:      VAS [place] UPPER EXT VEIN  MAPPING (PRE-OP AVF) Referring Phys: Aldona Bar RHYNE --------------------------------------------------------------------------------  Indications: Pre-access. Comparison Study: No prior study Performing Technologist: Maudry Mayhew MHA, RDMS, RVT, RDCS  Examination Guidelines: A complete evaluation includes B-mode imaging, spectral Doppler, color Doppler, and power Doppler as needed of all accessible portions of each vessel. Bilateral testing is considered an integral part of a complete examination. Limited examinations for reoccurring indications may be performed as noted. +-----------------+-------------+----------+--------------+ Right Cephalic   Diameter (cm)Depth (cm)   Findings    +-----------------+-------------+----------+--------------+ Prox upper arm       0.08                              +-----------------+-------------+----------+--------------+ Mid upper arm                           not visualized +-----------------+-------------+----------+--------------+ Dist upper arm                          not visualized +-----------------+-------------+----------+--------------+ Antecubital fossa                       not visualized +-----------------+-------------+----------+--------------+ Prox forearm                            not visualized +-----------------+-------------+----------+--------------+ Mid forearm                             not visualized +-----------------+-------------+----------+--------------+ Dist forearm                            not visualized +-----------------+-------------+----------+--------------+ Wrist  not visualized +-----------------+-------------+----------+--------------+ +--------------+-------------+----------+--------------+ Right Basilic Diameter (cm)Depth (cm)   Findings    +--------------+-------------+----------+--------------+ Prox upper arm                       not  visualized +--------------+-------------+----------+--------------+ +-----------------+-------------+----------+----------+ Left Cephalic    Diameter (cm)Depth (cm) Findings  +-----------------+-------------+----------+----------+ Shoulder             0.20                          +-----------------+-------------+----------+----------+ Prox upper arm       0.17                          +-----------------+-------------+----------+----------+ Mid upper arm        0.20                          +-----------------+-------------+----------+----------+ Dist upper arm       0.37               thrombosed +-----------------+-------------+----------+----------+ Antecubital fossa    0.35                          +-----------------+-------------+----------+----------+ Prox forearm         0.22               branching  +-----------------+-------------+----------+----------+ Mid forearm          0.24                          +-----------------+-------------+----------+----------+ Dist forearm         0.13               branching  +-----------------+-------------+----------+----------+ Wrist                0.15                          +-----------------+-------------+----------+----------+ +-----------------+-------------+----------+---------+ Left Basilic     Diameter (cm)Depth (cm)Findings  +-----------------+-------------+----------+---------+ Prox upper arm       0.30                         +-----------------+-------------+----------+---------+ Mid upper arm        0.22               branching +-----------------+-------------+----------+---------+ Dist upper arm       0.22                         +-----------------+-------------+----------+---------+ Antecubital fossa    0.22                         +-----------------+-------------+----------+---------+ Prox forearm         0.17                          +-----------------+-------------+----------+---------+ Mid forearm          0.10                         +-----------------+-------------+----------+---------+ Distal forearm       0.12                         +-----------------+-------------+----------+---------+ *  See table(s) above for measurements and observations. Left cephalic vein acutely thrombosed at distal upper arm.  Diagnosing physician: Monica Martinez MD Electronically signed by Monica Martinez MD on 08/07/2021 at 5:19:14 PM.    Final    DG ESOPHAGUS W SINGLE CM (SOL OR THIN BA)  Result Date: 08/07/2021 CLINICAL DATA:  Inpatient.  Dysphagia.  CHF. EXAM: ESOPHAGUS/BARIUM SWALLOW/TABLET STUDY TECHNIQUE: Single contrast examination was performed using thin liquid barium. This exam was performed by Rushie Nyhan, and was supervised and interpreted by Salvatore Marvel. FLUOROSCOPY TIME:  Fluoroscopy Time:  1 minute 42 seconds Number of Acquired Images:  4 COMPARISON:  08/02/2021 CT chest, abdomen and pelvis. FINDINGS: Limited study performed with the patient lying supine. Swallowing: No frank tracheobronchial aspiration. Pharynx: No gross abnormality. Esophagus: Mild smooth narrowing at the esophagogastric junction. Otherwise normal caliber of the thoracic esophagus. No discrete esophageal masses. Esophageal motility: Mild-to-moderate esophageal dysmotility, characterized by proximal escape and intermittent weakening of primary peristalsis throughout the thoracic esophagus. Hiatal Hernia: None. Gastroesophageal reflux: Unable to assess. Ingested 31mm barium tablet: Became lodged in the lower thoracic esophagus for several minutes despite multiple barium and water swallows. Other: None. IMPRESSION: 1. Mild-to-moderate esophageal dysmotility. 2. Mild smooth narrowing at the esophagogastric junction. Ingested 13 mm barium tablet remained lodged in the lower thoracic esophagus. A mild peptic stricture cannot be excluded in this location. No  discrete esophageal mass. Electronically Signed   By: Ilona Sorrel M.D.   On: 08/07/2021 11:43     CBC Latest Ref Rng & Units 08/11/2021 08/10/2021 08/08/2021  WBC 4.0 - 10.5 K/uL 4.9 4.5 -  Hemoglobin 12.0 - 15.0 g/dL 7.2(L) 7.4(L) 7.8(L)  Hematocrit 36.0 - 46.0 % 23.0(L) 23.6(L) 23.0(L)  Platelets 150 - 400 K/uL 171 177 -   BMP Latest Ref Rng & Units 08/11/2021 08/10/2021 08/08/2021  Glucose 70 - 99 mg/dL 109(H) 86 84  BUN 8 - 23 mg/dL 20 34(H) 19  Creatinine 0.44 - 1.00 mg/dL 3.28(H) 4.50(H) 2.70(H)  Sodium 135 - 145 mmol/L 137 138 137  Potassium 3.5 - 5.1 mmol/L 3.8 3.8 3.5  Chloride 98 - 111 mmol/L 103 107 101  CO2 22 - 32 mmol/L 28 25 -  Calcium 8.9 - 10.3 mg/dL 8.3(L) 8.3(L) -     Discharge Instructions: Discharge Instructions     Ambulatory referral to Psychology   Complete by: As directed    Palliative / grief     Discharge instructions  Audrey Hill,  It was a pleasure taking care of you at [facility] were admitted for chest pain and atrial fibrillation.  We found that you have gastritis on the scope of your stomach and have also started you on dialysis.  We are discharging you home now that you are doing better. Please follow the following instructions.  1) Start taking losartan 50 daily, hydralazine 25 TID, and coreg 12.5 BID.  2) Go to your dialysis sessions on Tuesday, Thursday and Saturday  3) Make sure to attend your follow-up appointments   Take care,  Dr. Linwood Dibbles, MD, MPH  Signed: Lacinda Axon, MD 08/12/2021, 4:00 PM   Pager: [phone]

## 2021-08-06 NOTE — Hospital Course (Addendum)
Patinent is sitting on edge of bed, toppled over in abd pain. Patient c/o abd pain, however able to tolerate foods. Consider  States her breathing is worse when lying flat  Telemtry dicontinued Lungs sounds clear   11/5  Pt is in dialysis. Waiting for long term dialysis access,  Per nephro -She is agreeable to AVF/AVG; Dr. Carlis Abbott w/ [facility] seeing today; appreciate help -If she is stable to DC after HD tomorrow then it is okay if she goes by my standpoint to start HD outpatient on Tuesday. If she is to stay inpatient hopefully she could get AVF/G sometime early next week. Don't think she has to stay just for the surgery; could be arranged outpatient if needed

## 2021-08-06 NOTE — Progress Notes (Signed)
This chaplain responded to PMT consult for spiritual care.  The Pt. is awake at the time of the visit and requested a warm blanket. The chaplain brought her blanket and covered her up before the Pt. chose to take a nap.  The chaplain shared prayer with the Pt. and will F/U.  Chaplain Sallyanne Kuster [phone]

## 2021-08-06 NOTE — Plan of Care (Signed)
  Problem: Education: Goal: Knowledge of General Education information will improve Description: Including pain rating scale, medication(s)/side effects and non-pharmacologic comfort measures Outcome: Progressing   Problem: Health Behavior/Discharge Planning: Goal: Ability to manage health-related needs will improve Outcome: Progressing   Problem: Clinical Measurements: Goal: Ability to maintain clinical measurements within normal limits will improve Outcome: Progressing   Problem: Clinical Measurements: Goal: Diagnostic test results will improve Outcome: Progressing   Problem: Clinical Measurements: Goal: Cardiovascular complication will be avoided Outcome: Progressing   Problem: Pain Managment: Goal: General experience of comfort will improve Outcome: Progressing   Problem: Safety: Goal: Ability to remain free from injury will improve Outcome: Progressing   Problem: Skin Integrity: Goal: Risk for impaired skin integrity will decrease Outcome: Progressing   Problem: Fluid Volume: Goal: Compliance with measures to maintain balanced fluid volume will improve Outcome: Progressing   Problem: Clinical Measurements: Goal: Complications related to the disease process, condition or treatment will be avoided or minimized Outcome: Progressing

## 2021-08-06 NOTE — Clinical Social Work CHF Assess (Signed)
CSW started application for transportation at [employer], [place] emailed the application to [employer] to submit application.

## 2021-08-06 NOTE — Progress Notes (Signed)
Met with pt at bedside to discuss pt's out-pt HD arrangements. Pt has been accepted at [facility] on a TTS schedule. Pt will need to arrive at 6:00 for 6:20 chair time. Pt can start Saturday if pt stable for d/c by then. If pt starts on Tuesday, pt will need to complete paperwork at clinic on Monday and pt will need to call clinc for appt time. Pt provided resource sheet with out-pt HD information documented. Left with pt at bedside. Pt has no questions or concerns at this time. Inquired of pt if I could speak to any family member regarding details. Pt requests I call dtr, P[place] [phone]). Attempted to call dtr but there was no answer and unable to leave message. Will continue efforts to reach dtr. Pt is in need of transportation to/from HD at d/c. TOC staff aware of this need and to assist pt. Renal Navigator provided pt's out-pt HD info to [facility] staff this am when received. Will follow and assist.  Melven Sartorius Renal Navigator [phone]

## 2021-08-06 NOTE — Progress Notes (Signed)
HD#3 Subjective:  Overnight Events: NAEO  Patient says that he abdominal pain is worse today, she says the medication helps her stomach but she still has pain when she is laying down and feels like her stomach is full. The medication only lasts for like an hour and she wishes it were IV as she says the pills are big. Denies any difficulty or painful swallowing. Wonders if her stomach pain might be an ulcer. She says that she has never had anything like this before. Because of the pain she is having trouble sleeping.   Objective:  Vital signs in last 24 hours: Vitals:   08/05/21 1438 08/05/21 1957 08/06/21 0426 08/06/21 0817  BP: 139/81 120/75 (!) 158/84 (!) 165/96  Pulse: 75 76 85 80  Resp: 19 19 19    Temp: 98 F (36.7 C) 98.3 F (36.8 C) 98.7 F (37.1 C)   TempSrc: Oral Oral Oral   SpO2: 96% 96% 97%   Weight:       Supplemental O2: Room Air SpO2: 97 % O2 Flow Rate (L/min): 2 L/min   Physical Exam:  Constitutional: Uncomfortable elderly woman sitting upright at the edge of bed in no acute distress HENT: normocephalic atraumatic, mucous membranes moist Eyes: conjunctiva non-erythematous Neck: supple Cardiovascular: regular rate and rhythm Pulmonary/Chest: normal work of breathing on room air, bibasilar rales Abdominal: soft, non-distended, mildly tender to deep palpation diffusely MSK: normal bulk and tone Neurological: alert & answering questions appropriately Skin: warm and dry Psych: sad affect  Filed Weights   08/04/21 1436 08/04/21 1705 08/05/21 0827  Weight: 63.5 kg 63 kg 62.6 kg     Intake/Output Summary (Last 24 hours) at 08/06/2021 0957 Last data filed at 08/06/2021 0600 Gross per 24 hour  Intake 675 ml  Output 1700 ml  Net -1025 ml   Net IO Since Admission: -715 mL [08/06/21 0957]  Pertinent Labs: CBC Latest Ref Rng & Units 08/06/2021 08/05/2021 08/05/2021  WBC 4.0 - 10.5 K/uL 4.9 - 5.1  Hemoglobin 12.0 - 15.0 g/dL 7.7(L) 7.6(L) 6.3(LL)  Hematocrit  36.0 - 46.0 % 24.1(L) 22.9(L) 19.5(L)  Platelets 150 - 400 K/uL 175 - 169    CMP Latest Ref Rng & Units 08/06/2021 08/05/2021 08/04/2021  Glucose 70 - 99 mg/dL 110(H) 100(H) 94  BUN 8 - 23 mg/dL 28(H) 41(H) 71(H)  Creatinine 0.44 - 1.00 mg/dL 3.38(H) 4.13(H) 5.81(H)  Sodium 135 - 145 mmol/L 137 137 139  Potassium 3.5 - 5.1 mmol/L 4.1 4.1 4.7  Chloride 98 - 111 mmol/L 105 105 109  CO2 22 - 32 mmol/L 26 25 22   Calcium 8.9 - 10.3 mg/dL 8.6(L) 8.1(L) 8.4(L)  Total Protein 6.5 - 8.1 g/dL 5.5(L) - -  Total Bilirubin 0.3 - 1.2 mg/dL 0.7 - -  Alkaline Phos 38 - 126 U/L 69 - -  AST 15 - 41 U/L 17 - -  ALT 0 - 44 U/L 12 - -    Imaging: No results found.  Assessment/Plan:   Active Problems:   Goals of care, counseling/discussion   Epigastric pain   Hypertensive urgency   Patient Summary: Audrey Hill is a 65 y.o.  65 y.o. 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   #Hypertensive Urgency #Headache Patient presented with hypertensive urgency after 2-3 weeks of medication non-adherence. Bps were  still elevated after dialysis though slightly improved. Losartan 25 added yesterday by nephrology.  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 Now s/p  - first dialysis session 11/1, next session 11/2 - Avoid nephrotoxic medications -Nephrology following appreciate assistance  - HF per nephrology  (11/1 and 11/2)   #Abdominal Pain Patient has been complaining of ongoing abdominal pain, does have a history of GERD but not on treatment. Heme occult negative, do not suspect active bleed but could have some component of esophagitis/gastritis. Increased pain medications on  11/1, patient responded well  initially but complaining of more discomfort today. Will obtain more imaging to try and evaluate the source of her pain. - Will obtain barium swallow; consider further contrasted imaging now that she is on dialysis - CBC daily - dilaudid 1g Q4 PRN -Continue IV Protonix 40 mg and Carafate 1g TID and qhs  #Anemia Patient's Hg dropped to 6.3 on 11/2, type and screen and one unit pRBC ordered. Responded appropriately after transfusion and remained stable today. - aranesp 60 per nephro - daily CBC - transfuse <7  #CHF #Concern for amyloidosis She states her chest pain is improved since admission. BNP was 2,292 on 08/02/21. Echo showed EF is 30 to 35 %. LV cavity is dilated. LV shows a Grade III diastolic dysfunction (restrictive) with findings concerning for amyloidosis. Limited in evaluation with contrast due to ESRD, however as patient is now on HD can try and coordinate cardiac MRI with dialysis schedule -coreg 12.5 BID, losartan 25 -Cardiac MRI once dialysis starts -Strict I/O -Daily weights - K>4, Mg >2   #Paroxysmal Atrial Fibrillation Currently in NSR. HR <100.  -Will hold Eliquis in setting of low hgb/possible bleed - CHA2DS2-VASc Score = 4   #Hx of CVA #Hyperlipidemia Patient has hx of HLD and hx of CVA. Her lipid panel on 08/03/21 showed her cholesterol to be 134, LDL to be 75. Patient is on eliquis due to her previous stroke and hx of a fib. -Continue home statin -Hold eliquis as above  #Tunneled Catheter Bleed; resolved   Diet: Heart Healthy IVF: None,None VTE: None Code: DNI  Prior to Admission Living Arrangement: Home Anticipated Discharge: Home Barriers to Discharge:  Dispo: Anticipated discharge to Home in 3 days pending HD and medical stabilization.   [name], MD Internal Medicine Resident PGY-1 Pager [phone] Please contact the on call pager after 5 pm and on weekends at [phone].

## 2021-08-06 NOTE — Progress Notes (Signed)
Patient is s/p [facility] placement with Dr. Dwaine Gale on 18/5/90, complicated by persistent bleeding from the [facility] exit site. S/p purse string suture placement with Dr. Dwaine Gale on 08/04/21.   The purse string suture was removed at bedside without difficulty.  Asked RN to place a new dressing and monitor the site for further bleeding.   Please call IR for questions and concerns regarding TDC.   Armando Gang Marykatherine Sherwood PA-C 08/06/2021 12:06 PM

## 2021-08-07 ENCOUNTER — Inpatient Hospital Stay (HOSPITAL_COMMUNITY): Payer: Medicare HMO

## 2021-08-07 DIAGNOSIS — Z803 Family history of malignant neoplasm of breast: Secondary | ICD-10-CM

## 2021-08-07 DIAGNOSIS — Z833 Family history of diabetes mellitus: Secondary | ICD-10-CM

## 2021-08-07 DIAGNOSIS — N186 End stage renal disease: Secondary | ICD-10-CM

## 2021-08-07 DIAGNOSIS — N051 Unspecified nephritic syndrome with focal and segmental glomerular lesions: Secondary | ICD-10-CM | POA: Diagnosis not present

## 2021-08-07 DIAGNOSIS — Z87891 Personal history of nicotine dependence: Secondary | ICD-10-CM

## 2021-08-07 DIAGNOSIS — K209 Esophagitis, unspecified without bleeding: Secondary | ICD-10-CM

## 2021-08-07 DIAGNOSIS — Z95828 Presence of other vascular implants and grafts: Secondary | ICD-10-CM | POA: Diagnosis not present

## 2021-08-07 DIAGNOSIS — I160 Hypertensive urgency: Secondary | ICD-10-CM | POA: Diagnosis not present

## 2021-08-07 DIAGNOSIS — Z8249 Family history of ischemic heart disease and other diseases of the circulatory system: Secondary | ICD-10-CM

## 2021-08-07 LAB — BASIC METABOLIC PANEL
Anion gap: 7 (ref 5–15)
BUN: 38 mg/dL — ABNORMAL HIGH (ref 8–23)
CO2: 24 mmol/L (ref 22–32)
Calcium: 8.5 mg/dL — ABNORMAL LOW (ref 8.9–10.3)
Chloride: 106 mmol/L (ref 98–111)
Creatinine, Ser: 4.3 mg/dL — ABNORMAL HIGH (ref 0.44–1.00)
GFR, Estimated: 11 mL/min — ABNORMAL LOW (ref >60)
Glucose, Bld: 82 mg/dL (ref 70–99)
Potassium: 4.2 mmol/L (ref 3.5–5.1)
Sodium: 137 mmol/L (ref 135–145)

## 2021-08-07 LAB — CBC
HCT: 24 % — ABNORMAL LOW (ref 36.0–46.0)
Hemoglobin: 7.5 g/dL — ABNORMAL LOW (ref 12.0–15.0)
MCH: 27.1 pg (ref 26.0–34.0)
MCHC: 31.3 g/dL (ref 30.0–36.0)
MCV: 86.6 fL (ref 80.0–100.0)
Platelets: 178 10*3/uL (ref 150–400)
RBC: 2.77 MIL/uL — ABNORMAL LOW (ref 3.87–5.11)
RDW: 13.6 % (ref 11.5–15.5)
WBC: 4.9 10*3/uL (ref 4.0–10.5)
nRBC: 0 % (ref 0.0–0.2)

## 2021-08-07 LAB — MAGNESIUM: Magnesium: 2.3 mg/dL (ref 1.7–2.4)

## 2021-08-07 MED ORDER — MELATONIN 3 MG PO TABS
3.0000 mg | ORAL_TABLET | Freq: Once | ORAL | Status: AC
  Administered 2021-08-07: 3 mg via ORAL
  Filled 2021-08-07: qty 1

## 2021-08-07 MED ORDER — LOSARTAN POTASSIUM 50 MG PO TABS
50.0000 mg | ORAL_TABLET | Freq: Every day | ORAL | Status: DC
  Administered 2021-08-07 – 2021-08-12 (×6): 50 mg via ORAL
  Filled 2021-08-07 (×6): qty 1

## 2021-08-07 MED ORDER — RENA-VITE PO TABS
1.0000 | ORAL_TABLET | Freq: Every day | ORAL | Status: DC
  Administered 2021-08-07 – 2021-08-11 (×5): 1 via ORAL
  Filled 2021-08-07 (×5): qty 1
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
## 2021-08-07 NOTE — Progress Notes (Signed)
Called to room to assess HD catheter. Blood clot noted around site. Dressing changed and no active bleeding noted. Dressing clean dry and intact upon leaving patient room.
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
## 2021-08-07 NOTE — Progress Notes (Signed)
CSW met with patient to discuss Medicaid transportation; patient does not qualify for [facility] [place] as she is in [place]. Patient says she has used Medicaid transportation before but it's annoying because she has to keep calling back to schedule. CSW informed patient that she will have to do that to get it scheduled on a regular basis after discharge or she can have family help her. Patient said she can have family help her call to schedule. CSW provided patient with the number to call to schedule Medicaid transportation. At this time, unsure of when patient will be medically stable for discharge, unable to schedule until a discharge date is known.  Laveda Abbe, [place] Clinical Social Worker [phone]

## 2021-08-07 NOTE — Progress Notes (Signed)
HD#4 Subjective:  Overnight Events: NAEO   Patient resting in bed, still complaining of abdominal pain. She has been able to rest some but thinks that the pain is interfering with this.   Objective:  Vital signs in last 24 hours: Vitals:   08/07/21 0432 08/07/21 0603 08/07/21 0907 08/07/21 1126  BP: (!) 162/100 (!) 150/85 (!) 161/96 (!) 161/96  Pulse: 83  84 84  Resp: 18   18  Temp: 98.1 F (36.7 C)   98.1 F (36.7 C)  TempSrc: Oral   Oral  SpO2: 99%     Weight:    62.6 kg  Height:    5\' 6"  (1.676 m)   Supplemental O2: Room Air SpO2: 99 % O2 Flow Rate (L/min): 2 L/min   Physical Exam:  Constitutional: elderly woman resting in bed on her side, in no acute distress HENT: normocephalic atraumatic, mucous membranes moist Eyes: conjunctiva non-erythematous Neck: supple Cardiovascular: regular rate and rhythm, no m/r/g Pulmonary/Chest: normal work of breathing on room air, lungs clear to auscultation bilaterally Abdominal: soft, mildly tender to deep palpation, non-distended MSK: normal bulk and tone Neurological: alert & answering questions appropriately Skin: warm and dry Psych: normal affect  Filed Weights   08/04/21 1705 08/05/21 0827 08/07/21 1126  Weight: 63 kg 62.6 kg 62.6 kg     Intake/Output Summary (Last 24 hours) at 08/07/2021 1216 Last data filed at 08/07/2021 0440 Gross per 24 hour  Intake 240 ml  Output 1050 ml  Net -810 ml   Net IO Since Admission: -1,525 mL [08/07/21 1216]  Pertinent Labs: CBC Latest Ref Rng & Units 08/07/2021 08/06/2021 08/05/2021  WBC 4.0 - 10.5 K/uL 4.9 4.9 -  Hemoglobin 12.0 - 15.0 g/dL 7.5(L) 7.7(L) 7.6(L)  Hematocrit 36.0 - 46.0 % 24.0(L) 24.1(L) 22.9(L)  Platelets 150 - 400 K/uL 178 175 -    CMP Latest Ref Rng & Units 08/07/2021 08/06/2021 08/05/2021  Glucose 70 - 99 mg/dL 82 110(H) 100(H)  BUN 8 - 23 mg/dL 38(H) 28(H) 41(H)  Creatinine 0.44 - 1.00 mg/dL 4.30(H) 3.38(H) 4.13(H)  Sodium 135 - 145 mmol/L 137 137 137   Potassium 3.5 - 5.1 mmol/L 4.2 4.1 4.1  Chloride 98 - 111 mmol/L 106 105 105  CO2 22 - 32 mmol/L 24 26 25   Calcium 8.9 - 10.3 mg/dL 8.5(L) 8.6(L) 8.1(L)  Total Protein 6.5 - 8.1 g/dL - 5.5(L) -  Total Bilirubin 0.3 - 1.2 mg/dL - 0.7 -  Alkaline Phos 38 - 126 U/L - 69 -  AST 15 - 41 U/L - 17 -  ALT 0 - 44 U/L - 12 -    Imaging: DG ESOPHAGUS W SINGLE CM (SOL OR THIN BA)  Result Date: 08/07/2021 CLINICAL DATA:  Inpatient.  Dysphagia.  CHF. EXAM: ESOPHAGUS/BARIUM SWALLOW/TABLET STUDY TECHNIQUE: Single contrast examination was performed using thin liquid barium. This exam was performed by Rushie Nyhan, and was supervised and interpreted by Salvatore Marvel. FLUOROSCOPY TIME:  Fluoroscopy Time:  1 minute 42 seconds Number of Acquired Images:  4 COMPARISON:  08/02/2021 CT chest, abdomen and pelvis. FINDINGS: Limited study performed with the patient lying supine. Swallowing: No frank tracheobronchial aspiration. Pharynx: No gross abnormality. Esophagus: Mild smooth narrowing at the esophagogastric junction. Otherwise normal caliber of the thoracic esophagus. No discrete esophageal masses. Esophageal motility: Mild-to-moderate esophageal dysmotility, characterized by proximal escape and intermittent weakening of primary peristalsis throughout the thoracic esophagus. Hiatal Hernia: None. Gastroesophageal reflux: Unable to assess. Ingested 56mm barium tablet: Became lodged  in the lower thoracic esophagus for several minutes despite multiple barium and water swallows. Other: None. IMPRESSION: 1. Mild-to-moderate esophageal dysmotility. 2. Mild smooth narrowing at the esophagogastric junction. Ingested 13 mm barium tablet remained lodged in the lower thoracic esophagus. A mild peptic stricture cannot be excluded in this location. No discrete esophageal mass. Electronically Signed   By: Ilona Sorrel M.D.   On: 08/07/2021 11:43    Assessment/Plan:   Active Problems:   Goals of care, counseling/discussion    Epigastric pain   Hypertensive urgency   Patient Summary: Audrey Hill is a 65 y.o.  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   #Hypertensive Urgency #Headache Patient presented with hypertensive urgency after 2-3 weeks of medication non-adherence. Bps were still elevated after dialysis though slightly improved. 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 Now s/p  - Avoid nephrotoxic medications -Nephrology following appreciate assistance  - HF per nephrology  (next session Saturday)   #Abdominal Pain Patient has been complaining of ongoing abdominal pain, does have a history of GERD but not on treatment. Heme occult negative, do not suspect active bleed but could have some component of esophagitis/gastritis. Esophogram showed mild-moderate esophageal dysmotility with a mild narrowing at the EG junction. Cannot rule old mild stricture - Gi consulted, appreciate assistance  -f/u GI recs - CBC daily - dilaudid 1g Q4 PRN -Continue IV Protonix 40 mg and Carafate 1g TID and qhs   #Anemia Patient's Hg dropped to 6.3 on 11/2, type and screen and one unit pRBC ordered. Responded appropriately after transfusion and remained stable. - aranesp 60 per nephro - daily CBC - transfuse <7   #CHF #Concern for amyloidosis She states her chest pain is improved since admission. BNP was 2,292 on 08/02/21. Echo showed EF is 30 to 35 %. LV cavity is dilated. LV shows a Grade III diastolic dysfunction (restrictive) with findings concerning for amyloidosis. Limited in evaluation with contrast due to ESRD, however as patient is now on HD can try and  coordinate cardiac MRI with dialysis schedule -coreg 12.5 BID, losartan 25 -Cardiac MRI once dialysis starts -Strict I/O -Daily weights - K>4, Mg >2   #Paroxysmal Atrial Fibrillation Currently in NSR. HR <100.  -Will hold Eliquis in setting of low hgb/possible bleed - CHA2DS2-VASc Score = 4    #Hx of CVA #Hyperlipidemia Patient has hx of HLD and hx of CVA. Her lipid panel on 08/03/21 showed her cholesterol to be 134, LDL to be 75. Patient is on eliquis due to her previous stroke and hx of a fib. -Continue home statin -Hold eliquis as above   #Tunneled Catheter Bleed; resolved   Diet: Heart Healthy IVF: None,None VTE: None Code: DNI  Prior to Admission Living Arrangement: Home Anticipated Discharge: Home Barriers to Discharge:  Dispo: Anticipated discharge to Home in 2 days pending GI workup   [name], MD Internal Medicine Resident PGY-1 Pager [phone] Please contact the on call pager after 5 pm and on weekends at [phone].

## 2021-08-07 NOTE — Progress Notes (Signed)
Mobility Specialist Progress Note    08/07/21 1627  Mobility  Activity Refused mobility   Pt stated she is too tired.   Hildred Alamin Mobility Specialist  Mobility Specialist Phone: [phone]

## 2021-08-07 NOTE — Progress Notes (Signed)
[facility] ([facility])  Hospital Liaison RN note         Notified by TOC manager of patient/family request for [facility] Palliative services at home after discharge.              [facility] Palliative team will follow up with patient after discharge.         Please call with any hospice or palliative related questions.         Thank you for the opportunity to participate in this patient's care.     Chrislyn King, BSN, RN [facility] Hospital Liaison (listed on [facility] under Hospice/[facility])    [phone] (24h on call)    

## 2021-08-07 NOTE — Progress Notes (Signed)
Upper extremity vein mapping completed. Refer to "CV Proc" under chart review to view preliminary results.  08/07/2021 2:49 PM Kelby Aline., MHA, RVT, RDCS, RDMS

## 2021-08-07 NOTE — Care Management (Signed)
08-07-21 1425 Case Manager spoke with patient on 08-06-21 regarding outpatient palliative services. Patient is agreeable to services. Patient provided verbal permission for Case Manager to reach out to daughter and the number in Epic is incorrect. Case Manager submitted referral to [facility] to contact the patient in the home. Transitions of Care team will continue to follow the patient for disposition needs.
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
## 2021-08-07 NOTE — Progress Notes (Signed)
Daily Progress Note   Patient Name: Audrey Hill       Date: 08/07/2021 DOB: December 08, 1955  Age: 65 y.o. MRN#: [id number] Attending Physician: Angelica Pou, MD Primary Care Physician: Elizabeth Palau, MD (Inactive) Admit Date: 08/02/2021  Reason for Consultation/Follow-up: Establishing goals of care  Subjective: Medical records reviewed. Patient assessed at the bedside. She reports abdominal pain.  Discussed interval history since our last conversation and patient's overall thoughts and feelings on her current illness. She is worried about going home before her abdominal pain resolves, as she does not want to come right back. She is also concerned about discharging before getting permanent HD access, citing transportation difficulties and not wanting to burden her daughter. We discussed the importance of advance care planning and her thoughts on naming a HCPOA. Counseled on ACP documentation, Living Will and provided with a copy. She feels that her children will all work together and this may not be beneficial for her. Encouraged patient to consider completing and offered assistance with coordination of notary during admission.  Discussed referral to behavioral health for grief counseling and patient's loss of her brother. She feels it would be beneficial to wait at least a month to give her time to settle in at home and get accustomed to her new routine with dialysis treatments.  Questions and concerns addressed. PMT will continue to support holistically.   Length of Stay: 4  Current Medications: Scheduled Meds:   atorvastatin  20 mg Oral Daily   carvedilol  12.5 mg Oral BID WC   Chlorhexidine Gluconate Cloth  6 each Topical Q0600   [START ON 08/08/2021] darbepoetin  (ARANESP) injection - DIALYSIS  60 mcg Intravenous Q Sat-HD   prochlorperazine  10 mg Intravenous Once   Followed by   diphenhydrAMINE  12.5 mg Intravenous Once   hydrALAZINE  50 mg Oral Q8H   influenza vaccine adjuvanted  0.5 mL Intramuscular Tomorrow-1000   losartan  50 mg Oral Daily   pantoprazole (PROTONIX) IV  40 mg Intravenous Q12H   pneumococcal 23 valent vaccine  0.5 mL Intramuscular Tomorrow-1000   senna  1 tablet Oral Daily   sucralfate  1 g Oral TID WC & HS    Continuous Infusions:  sodium chloride 10 mL/hr at 08/03/21 0139    PRN Meds: sodium chloride,  acetaminophen, benzonatate, HYDROmorphone, polyethylene glycol, ramelteon, trimethobenzamide  Physical Exam Vitals and nursing note reviewed.  Constitutional:      General: She is not in acute distress. Cardiovascular:     Rate and Rhythm: Normal rate.  Pulmonary:     Effort: Pulmonary effort is normal.  Skin:    General: Skin is warm and dry.  Neurological:     Mental Status: She is alert and oriented to person, place, and time.            Vital Signs: BP (!) 156/111   Pulse 84   Temp 98.1 F (36.7 C) (Oral)   Resp 18   Ht 5\' 6"  (1.676 m)   Wt 62.6 kg   SpO2 99%   BMI 22.28 kg/m  SpO2: SpO2: 99 % O2 Device: O2 Device: Room Air O2 Flow Rate: O2 Flow Rate (L/min): 2 L/min  Intake/output summary:  Intake/Output Summary (Last 24 hours) at 08/07/2021 1454 Last data filed at 08/07/2021 0440 Gross per 24 hour  Intake 240 ml  Output 1050 ml  Net -810 ml    LBM: Last BM Date: 08/06/21 Baseline Weight: Weight: 63.1 kg Most recent weight: Weight: 62.6 kg       Palliative Assessment/Data: 70%      Patient Active Problem List   Diagnosis Date Noted   Hypertensive urgency 08/02/2021   Epigastric pain    Duodenitis 11/14/2014   Hematemesis 11/13/2014   Asthma, chronic 11/13/2014   Nausea vomiting and diarrhea 11/13/2014   History of stroke 11/13/2014   A-fib ([place]) 11/13/2014   AKI (acute kidney  injury) ([place]) 11/13/2014   Elevated lipase 11/13/2014   Influenza-like illness 09/29/2013   Goals of care, counseling/discussion 06/18/2013   Hypertension    Hyperlipidemia    GERD (gastroesophageal reflux disease)    Hepatitis     Palliative Care Assessment & Plan   Patient Profile: 65 y.o. 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    Patient arrived in the ED coughing up blood, along with nausea and weakness. CHF exacerbation 2/2 to hypertensive urgency in setting of noncompliance with her medications. She initially declined HD and desired to remain a full code. PMT has been consulted to assist with goals of care conversation.  Assessment: ESRD CHF Goals of care conversation  Recommendations/Plan: Patient provided with ACP documentation and encouraged to consider completion of HCPOA/Living Will Psychosocial and emotional support provided PMT remains available acutely on an as-needed basis. Please secure chat with urgent needs.  Goals of Care and Additional Recommendations: Limitations on Scope of Treatment: No Artificial Feeding and No Tracheostomy   Prognosis:  Unable to determine  Discharge Planning: Home with Palliative Services  Care plan was discussed with patient  Total time: 35 minutes Greater than 50% of this time was spent in counseling and coordinating care related to the above assessment and plan.  Dorthy Cooler, PA-C Palliative Medicine Team Team phone # [phone]  Thank you for allowing the Palliative Medicine Team to assist in the care of this patient. Please utilize secure chat with additional questions, if there is no response within 30 minutes please call the above phone number.  Palliative Medicine Team providers are available by phone from 7am to 7pm daily  and can be reached through the team cell phone.  Should this patient require assistance  outside of these hours, please call the patient's attending physician.

## 2021-08-07 NOTE — Progress Notes (Addendum)
Navigator has attempted to reach pt's dtr to discuss out-pt HD arrangements but have been unsuccessful. Spoke to pt who states dtr needs to be called after 2:00 when dtr gets off work [phone]). Will attempt to call dtr after 2:00 today. Spoke to [place] at [facility] to advise clinic pt to start on Tuesday. Explained that pt may have a difficult time coming Monday for paperwork due to transportation. Clinic agreeable to pt starting Tuesday morning and staff will complete needed paperwork prior to start of treatment and then complete the rest once staff arrive at clinic. Pt made aware of this info. Will follow and assist.   Melven Sartorius Renal Navigator [phone]    Addendum at 2:10 pm: Attempted to reach pt's dtr, [place]. Call goes directly to voicemail and unable to leave a message due to voicemail being full. Will continue efforts.
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
## 2021-08-08 ENCOUNTER — Inpatient Hospital Stay (HOSPITAL_COMMUNITY): Payer: Medicare HMO | Admitting: Certified Registered"

## 2021-08-08 ENCOUNTER — Encounter (HOSPITAL_COMMUNITY)
Admission: EM | Disposition: A | Discharge status: Home Health | Payer: Self-pay | Source: Home / Self Care | Attending: Internal Medicine

## 2021-08-08 ENCOUNTER — Encounter (HOSPITAL_COMMUNITY): Payer: Self-pay | Admitting: Internal Medicine

## 2021-08-08 DIAGNOSIS — K2970 Gastritis, unspecified, without bleeding: Secondary | ICD-10-CM

## 2021-08-08 DIAGNOSIS — I161 Hypertensive emergency: Secondary | ICD-10-CM | POA: Diagnosis not present

## 2021-08-08 DIAGNOSIS — R1310 Dysphagia, unspecified: Secondary | ICD-10-CM

## 2021-08-08 DIAGNOSIS — N186 End stage renal disease: Secondary | ICD-10-CM | POA: Diagnosis not present

## 2021-08-08 DIAGNOSIS — R109 Unspecified abdominal pain: Secondary | ICD-10-CM

## 2021-08-08 DIAGNOSIS — E440 Moderate protein-calorie malnutrition: Secondary | ICD-10-CM | POA: Insufficient documentation

## 2021-08-08 HISTORY — PX: BIOPSY: SHX5522

## 2021-08-08 HISTORY — PX: ESOPHAGOGASTRODUODENOSCOPY (EGD) WITH PROPOFOL: SHX5813

## 2021-08-08 LAB — BASIC METABOLIC PANEL
Anion gap: 6 (ref 5–15)
BUN: 42 mg/dL — ABNORMAL HIGH (ref 8–23)
CO2: 22 mmol/L (ref 22–32)
Calcium: 8.5 mg/dL — ABNORMAL LOW (ref 8.9–10.3)
Chloride: 104 mmol/L (ref 98–111)
Creatinine, Ser: 4.54 mg/dL — ABNORMAL HIGH (ref 0.44–1.00)
GFR, Estimated: 10 mL/min — ABNORMAL LOW (ref >60)
Glucose, Bld: 79 mg/dL (ref 70–99)
Potassium: 4.3 mmol/L (ref 3.5–5.1)
Sodium: 132 mmol/L — ABNORMAL LOW (ref 135–145)

## 2021-08-08 LAB — CBC
HCT: 25.9 % — ABNORMAL LOW (ref 36.0–46.0)
Hemoglobin: 8.3 g/dL — ABNORMAL LOW (ref 12.0–15.0)
MCH: 27.9 pg (ref 26.0–34.0)
MCHC: 32 g/dL (ref 30.0–36.0)
MCV: 86.9 fL (ref 80.0–100.0)
Platelets: 207 10*3/uL (ref 150–400)
RBC: 2.98 MIL/uL — ABNORMAL LOW (ref 3.87–5.11)
RDW: 13.6 % (ref 11.5–15.5)
WBC: 5.3 10*3/uL (ref 4.0–10.5)
nRBC: 0 % (ref 0.0–0.2)

## 2021-08-08 LAB — POCT I-STAT, CHEM 8
BUN: 19 mg/dL (ref 8–23)
Calcium, Ion: 1.06 mmol/L — ABNORMAL LOW (ref 1.15–1.40)
Chloride: 101 mmol/L (ref 98–111)
Creatinine, Ser: 2.7 mg/dL — ABNORMAL HIGH (ref 0.44–1.00)
Glucose, Bld: 84 mg/dL (ref 70–99)
HCT: 23 % — ABNORMAL LOW (ref 36.0–46.0)
Hemoglobin: 7.8 g/dL — ABNORMAL LOW (ref 12.0–15.0)
Potassium: 3.5 mmol/L (ref 3.5–5.1)
Sodium: 137 mmol/L (ref 135–145)
TCO2: 26 mmol/L (ref 22–32)

## 2021-08-08 SURGERY — ESOPHAGOGASTRODUODENOSCOPY (EGD) WITH PROPOFOL
Anesthesia: Monitor Anesthesia Care

## 2021-08-08 MED ORDER — SODIUM CHLORIDE 0.9 % IV SOLN: INTRAVENOUS | Status: DC | PRN

## 2021-08-08 MED ORDER — LIDOCAINE 2% (20 MG/ML) 5 ML SYRINGE
INTRAMUSCULAR | Status: DC | PRN
  Administered 2021-08-08: 40 mg via INTRAVENOUS

## 2021-08-08 MED ORDER — PANTOPRAZOLE SODIUM 40 MG PO TBEC
40.0000 mg | DELAYED_RELEASE_TABLET | Freq: Two times a day (BID) | ORAL | Status: DC
  Administered 2021-08-08 – 2021-08-12 (×8): 40 mg via ORAL
  Filled 2021-08-08 (×8): qty 1

## 2021-08-08 MED ORDER — PROPOFOL 500 MG/50ML IV EMUL
INTRAVENOUS | Status: DC | PRN
  Administered 2021-08-08: 100 ug/kg/min via INTRAVENOUS

## 2021-08-08 SURGICAL SUPPLY — 15 items

## 2021-08-08 NOTE — Interval H&P Note (Signed)
History and Physical Interval Note:  08/08/2021 1:44 PM  Audrey Hill  has presented today for surgery, with the diagnosis of Epigastric abdominal pain and abnormal esophagram.  The various methods of treatment have been discussed with the patient and family. After consideration of risks, benefits and other options for treatment, the patient has consented to  Procedure(s): ESOPHAGOGASTRODUODENOSCOPY (EGD) WITH PROPOFOL (N/A) as a surgical intervention.  The patient's history has been reviewed, patient examined, no change in status, stable for surgery.  I have reviewed the patient's chart and labs.  Questions were answered to the patient's satisfaction.     Sharyn Creamer

## 2021-08-08 NOTE — Progress Notes (Addendum)
HD#5 Subjective:  Overnight Events: NAEO  Patient was evaluated during her HD session this morning. Patient reports that the HD bed is uncomfortable and states her abdominal pain has not improved significantly. She denies any shortness of breath, chest pain or palpitations. When discussing plans for possible discharge today, patient states she was informed that she will need a permanent HD access and EGD. Patient states she would like to get everything done during this hospitalization before going home.  Objective:  Vital signs in last 24 hours: Vitals:   08/08/21 1030 08/08/21 1121 08/08/21 1204 08/08/21 1333  BP: (!) 174/91 (!) 153/92 (!) 141/65 (!) 196/82  Pulse: 67 79 80 75  Resp:  16  (!) 21  Temp:  98.2 F (36.8 C)  98.6 F (37 C)  TempSrc:  Oral  Temporal  SpO2:  100%  96%  Weight:  61.5 kg    Height:       Supplemental O2: Room Air SpO2: 96 % O2 Flow Rate (L/min): 2 L/min   Physical Exam:  Constitutional: Well-appearing but mildly cachectic elderly woman in HD bed. NAD. Cardiovascular: RRR. No m/r/g. No LE edema. Pulmonary/Chest: Lungs CTAB. No wheezing or rales. Abdominal: Soft.  Mildly tender to palpation. No distention. Normal bowel sounds. MSK: normal bulk and tone Neurological: Alert and oriented x3. No focal deficits. Skin: warm and dry Psych: Appropriate mood and affect.  Filed Weights   08/07/21 1126 08/08/21 0735 08/08/21 1121  Weight: 62.6 kg 62.5 kg 61.5 kg     Intake/Output Summary (Last 24 hours) at 08/08/2021 1357 Last data filed at 08/08/2021 1053 Gross per 24 hour  Intake 250 ml  Output 1000 ml  Net -750 ml   Net IO Since Admission: -2,275 mL [08/08/21 1357]  Pertinent Labs: CBC Latest Ref Rng & Units 08/08/2021 08/07/2021 08/06/2021  WBC 4.0 - 10.5 K/uL 5.3 4.9 4.9  Hemoglobin 12.0 - 15.0 g/dL 8.3(L) 7.5(L) 7.7(L)  Hematocrit 36.0 - 46.0 % 25.9(L) 24.0(L) 24.1(L)  Platelets 150 - 400 K/uL 207 178 175    CMP Latest Ref Rng & Units  08/08/2021 08/07/2021 08/06/2021  Glucose 70 - 99 mg/dL 79 82 110(H)  BUN 8 - 23 mg/dL 42(H) 38(H) 28(H)  Creatinine 0.44 - 1.00 mg/dL 4.54(H) 4.30(H) 3.38(H)  Sodium 135 - 145 mmol/L 132(L) 137 137  Potassium 3.5 - 5.1 mmol/L 4.3 4.2 4.1  Chloride 98 - 111 mmol/L 104 106 105  CO2 22 - 32 mmol/L 22 24 26   Calcium 8.9 - 10.3 mg/dL 8.5(L) 8.5(L) 8.6(L)  Total Protein 6.5 - 8.1 g/dL - - 5.5(L)  Total Bilirubin 0.3 - 1.2 mg/dL - - 0.7  Alkaline Phos 38 - 126 U/L - - 69  AST 15 - 41 U/L - - 17  ALT 0 - 44 U/L - - 12    Imaging: VAS [place] UPPER EXT VEIN MAPPING (PRE-OP AVF)  Result Date: 08/07/2021 [place] Patient Name:  TEEGHAN Hill  Date of Exam:   08/07/2021 Medical Rec #: [id number]            Accession #:    [id number] Date of Birth: 12-10-1955           Patient Gender: F Patient Age:   65 years Exam Location:  [facility] Procedure:      VAS [place] UPPER EXT VEIN MAPPING (PRE-OP AVF) Referring Phys: Aldona Bar RHYNE --------------------------------------------------------------------------------  Indications: Pre-access. Comparison Study: No prior study Performing Technologist: Maudry Mayhew MHA, RDMS,  RVT, RDCS  Examination Guidelines: A complete evaluation includes B-mode imaging, spectral Doppler, color Doppler, and power Doppler as needed of all accessible portions of each vessel. Bilateral testing is considered an integral part of a complete examination. Limited examinations for reoccurring indications may be performed as noted. +-----------------+-------------+----------+--------------+ Right Cephalic   Diameter (cm)Depth (cm)   Findings    +-----------------+-------------+----------+--------------+ Prox upper arm       0.08                              +-----------------+-------------+----------+--------------+ Mid upper arm                           not visualized +-----------------+-------------+----------+--------------+ Dist upper arm                           not visualized +-----------------+-------------+----------+--------------+ Antecubital fossa                       not visualized +-----------------+-------------+----------+--------------+ Prox forearm                            not visualized +-----------------+-------------+----------+--------------+ Mid forearm                             not visualized +-----------------+-------------+----------+--------------+ Dist forearm                            not visualized +-----------------+-------------+----------+--------------+ Wrist                                   not visualized +-----------------+-------------+----------+--------------+ +--------------+-------------+----------+--------------+ Right Basilic Diameter (cm)Depth (cm)   Findings    +--------------+-------------+----------+--------------+ Prox upper arm                       not visualized +--------------+-------------+----------+--------------+ +-----------------+-------------+----------+----------+ Left Cephalic    Diameter (cm)Depth (cm) Findings  +-----------------+-------------+----------+----------+ Shoulder             0.20                          +-----------------+-------------+----------+----------+ Prox upper arm       0.17                          +-----------------+-------------+----------+----------+ Mid upper arm        0.20                          +-----------------+-------------+----------+----------+ Dist upper arm       0.37               thrombosed +-----------------+-------------+----------+----------+ Antecubital fossa    0.35                          +-----------------+-------------+----------+----------+ Prox forearm         0.22               branching  +-----------------+-------------+----------+----------+ Mid forearm          0.24                          +-----------------+-------------+----------+----------+  Dist forearm          0.13               branching  +-----------------+-------------+----------+----------+ Wrist                0.15                          +-----------------+-------------+----------+----------+ +-----------------+-------------+----------+---------+ Left Basilic     Diameter (cm)Depth (cm)Findings  +-----------------+-------------+----------+---------+ Prox upper arm       0.30                         +-----------------+-------------+----------+---------+ Mid upper arm        0.22               branching +-----------------+-------------+----------+---------+ Dist upper arm       0.22                         +-----------------+-------------+----------+---------+ Antecubital fossa    0.22                         +-----------------+-------------+----------+---------+ Prox forearm         0.17                         +-----------------+-------------+----------+---------+ Mid forearm          0.10                         +-----------------+-------------+----------+---------+ Distal forearm       0.12                         +-----------------+-------------+----------+---------+ *See table(s) above for measurements and observations. Left cephalic vein acutely thrombosed at distal upper arm.  Diagnosing physician: Monica Martinez MD Electronically signed by Monica Martinez MD on 08/07/2021 at 5:19:14 PM.    Final     Assessment/Plan:   Active Problems:   Goals of care, counseling/discussion   Epigastric pain   Hypertensive urgency   Esophagitis   Malnutrition of moderate degree  Patient Summary: Audrey Hill is a 65 y.o.  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  Patient has been accepted at [facility] on the TuThuSat schedule.  Vein mapping showed Left cephalic vein acutely thrombosed at distal upper arm.  Findings suggest patient will be at high risk of requiring graft placement per vascular surgery. Patient tolerated HD session well today.  BP improved status post HD this morning.  Patient remains adamant about staying for all her procedures before leaving the hospital. --Nephro following, cleared for discharge after HD today --Scheduled for left arm AV fistula versus graft on Tuesday in OR with Dr. Unk Lightning --Emanuel working on transportation to/from HD in the outpatient --Avoid nephrotoxic agents.   #Gastritis Patient has been complaining of ongoing abdominal pain, does have a history of GERD but not on treatment. Esophogram showed mild-moderate esophageal dysmotility with a mild narrowing at the EG junction. Cannot rule old mild stricture.  EGD on  11/5 showed some gastritis and duodenal nodules that were biopsied no obvious cause of dysphagia rec PPI BID for 8 weeks. GI will sign off. --GI signing off today      --GI recommending PPI twice daily for 8 weeks      --Plan to schedule outpatient follow-up for further evaluation of possible dysmotility disorder --Continue Dilaudid 1 mg q4h prn --Continue Carafate 1g TID and qhs  #Hypertensive Urgency #Headache Patient presented with hypertensive urgency after 2-3 weeks of medication non-adherence. Headache improving. BP stable after dialysis today. BP of 135/78.  --Continue Coreg 12.5 mg twice daily --Continue hydralazine 50 mg q8hr --Continue losartan 50 mg daily  #Anemia Patient's Hg dropped to 6.3 on 11/2, type and screen and one unit pRBC ordered. Responded appropriately after transfusion and remained stable.  Hemoglobin 8.3 today. --Aranesp 60 per nephro --Daily CBC --Transfuse <7   #CHF #Concern for amyloidosis She states her chest pain is improved since admission. BNP was 2,292 on  08/02/21. Echo showed EF is 30 to 35 %. LV cavity is dilated. LV shows a Grade III diastolic dysfunction (restrictive) with findings concerning for amyloidosis. Limited in evaluation with contrast due to ESRD, however as patient is now on HD can try and coordinate cardiac MRI with dialysis schedule --Coreg 12.5 BID, losartan 25 --Cardiology referral at discharge --Strict I/O --Daily weights --K>4, Mg >2   #Paroxysmal Atrial Fibrillation Stable. CHA2DS2-VASc Score = 4. Continues to be in normal sinus with heart rate in the 60s to 80s.   --Continue to hold Eliquis, can restart if hemoglobin remains stable  #Hx of CVA #Hyperlipidemia Patient has hx of HLD and hx of CVA. Her lipid panel on 08/03/21 showed her cholesterol to be 134, LDL to be 75. Patient is on eliquis due to her previous stroke and hx of a fib. --Continue home statin --Hold eliquis as above  #Goals of care Patient with end-stage heart failure and end-stage renal disease. Patient has established with [facility] in the outpatient. Remains DNI. --[facility] Palliative team will follow up with patient after discharge.   #Tunneled Catheter Bleed; resolved   Diet: Advance as tolerated IVF: None,None VTE: None Code: DNI  Prior to Admission Living Arrangement: Home Anticipated Discharge: Home Barriers to Discharge: Scheduled permanent HD access Dispo: Anticipated discharge to Home in 2 to 3 days pending permanent access for HD   Lacinda Axon, MD 08/08/2021, 3:11 PM IM Resident, PGY-2 Pager: [phone] Internal [place] 41:10  Please contact the on call pager after 5 pm and on weekends at [phone].

## 2021-08-08 NOTE — Transfer of Care (Signed)
Immediate Anesthesia Transfer of Care Note  Patient: Audrey Hill  Procedure(s) Performed: ESOPHAGOGASTRODUODENOSCOPY (EGD) WITH PROPOFOL BIOPSY  Patient Location: PACU  Anesthesia Type:MAC  Level of Consciousness: drowsy and patient cooperative  Airway & Oxygen Therapy: Patient Spontanous Breathing and Patient connected to nasal cannula oxygen  Post-op Assessment: Report given to RN  Post vital signs: Reviewed and stable  Last Vitals:  Vitals Value Taken Time  BP 128/72 08/08/21 1427  Temp    Pulse 66 08/08/21 1427  Resp 26 08/08/21 1427  SpO2 97 % 08/08/21 1427  Vitals shown include unvalidated device data.  Last Pain:  Vitals:   08/08/21 1333  TempSrc: Temporal  PainSc: 7       Patients Stated Pain Goal: 0 (01/74/94 4967)  Complications: No notable events documented.

## 2021-08-08 NOTE — Progress Notes (Signed)
Pacu RN Report to floor given  Gave report to [employer], rm [phone].  Discussed surgery, meds given in OR and Pacu, VS, IV fluids given, EBL, urine output, pain and other pertinent information. Also discussed if pt had any family or friends here or belongings with them. Pt had 2 biopsies. No reason found for trouble swallowing. Dr would like to follow up in her clinic r/t it being a motility issue. Reminded RN she may need additional speech and swallow eval.   Pt exits my care.

## 2021-08-08 NOTE — Anesthesia Preprocedure Evaluation (Signed)
Anesthesia Evaluation  Patient identified by MRN, date of birth, ID band Patient awake    Reviewed: Allergy & Precautions, NPO status , Patient's Chart, lab work & pertinent test results  Airway Mallampati: II  TM Distance: >3 FB Neck ROM: Full    Dental  (+) Edentulous Upper, Edentulous Lower   Pulmonary asthma , former smoker,    Pulmonary exam normal        Cardiovascular hypertension, + angina with exertion + dysrhythmias Atrial Fibrillation  Rhythm:Regular Rate:Normal     Neuro/Psych  Headaches, Seizures - (2012), Well Controlled,  Anxiety CVA (2008)    GI/Hepatic GERD  Medicated,(+) Hepatitis -Abdominal pain   Endo/Other    Renal/GU ESRF and DialysisRenal disease  negative genitourinary   Musculoskeletal  (+) Arthritis , Osteoarthritis,    Abdominal Normal abdominal exam  (+)   Peds  Hematology  (+) anemia ,   Anesthesia Other Findings   Reproductive/Obstetrics                             Anesthesia Physical Anesthesia Plan  ASA: 3  Anesthesia Plan: MAC   Post-op Pain Management:    Induction: Intravenous  PONV Risk Score and Plan: 2 and Propofol infusion and Treatment may vary due to age or medical condition  Airway Management Planned: Simple Face Mask, Natural Airway and Nasal Cannula  Additional Equipment: None  Intra-op Plan:   Post-operative Plan:   Informed Consent: I have reviewed the patients History and Physical, chart, labs and discussed the procedure including the risks, benefits and alternatives for the proposed anesthesia with the patient or authorized representative who has indicated his/her understanding and acceptance.     Dental advisory given  Plan Discussed with: CRNA  Anesthesia Plan Comments: (Lab Results      Component                Value               Date                      WBC                      5.3                 08/08/2021                 HGB                      7.8 (L)             08/08/2021                HCT                      23.0 (L)            08/08/2021                MCV                      86.9                08/08/2021                PLT  207                 08/08/2021           Lab Results      Component                Value               Date                      NA                       137                 08/08/2021                K                        3.5                 08/08/2021                CO2                      22                  08/08/2021                GLUCOSE                  84                  08/08/2021                BUN                      19                  08/08/2021                CREATININE               2.70 (H)            08/08/2021                CALCIUM                  8.5 (L)             08/08/2021                GFRNONAA                 10 (L)              08/08/2021          )        Anesthesia Quick Evaluation

## 2021-08-08 NOTE — Progress Notes (Signed)
Nephrology Follow-Up Consult note   Assessment/Recommendations: Audrey Hill is a/an 65 y.o. 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  She was hesitant to start dialysis but accepting now. She has had issues with compliance but because she remains fairly functional she is a suitable candidate for dialysis -Appreciate TDC placement by IR -She will be TTS outpatient at [facility] -Next HD on saturday -She is agreeable to AVF/AVG; Dr. Carlis Abbott w/ [facility] seeing today; appreciate help -Patient adamant she wants AVF/G before she leaves hospital; hopefully we can get this done. Appreciate help from [facility]   Epigastric Pain/Nausea and Vomiting: being treated for reflux per primary. Uremia likely playing some role in nausea. EGD today   Hypertension: Blood pressure continues to be slightly high but overall improved.  Continue current medications.    Afib: meds per primary   Anemia of CKD: also blood loss possible. Maybe PUD with epigastric pain. Iron depleted.  Required transfusion on 11/2.  Continue aranesp 92mcg.  Secondary Hyperparathyroidism: PTH 281. Calcium and phos acceptable. No treatment  Concern for amyloid: on Echo. W/u per primary   Recommendations conveyed to primary service.    [facility] 08/08/2021 10:24 AM  ___________________________________________________________  CC: ESRD  Interval History/Subjective: Patient states she feels well with no complaints. Getting EGD today.   Medications:  Current Facility-Administered Medications  Medication Dose Route Frequency Provider Last Rate Last Admin   0.9 %  sodium chloride infusion   Intravenous PRN Angelica Pou, MD 10 mL/hr at 08/03/21 0139  New Bag at 08/03/21 0139   acetaminophen (TYLENOL) tablet 650 mg  650 mg Oral Q6H PRN Sanjuan Dame, MD       atorvastatin (LIPITOR) tablet 20 mg  20 mg Oral Daily Idamae Schuller, MD   20 mg at 08/07/21 1345   benzonatate (TESSALON) capsule 100 mg  100 mg Oral TID PRN Sanjuan Dame, MD   100 mg at 08/04/21 1113   carvedilol (COREG) tablet 12.5 mg  12.5 mg Oral BID WC Demaio, Alexa, MD   12.5 mg at 08/07/21 1757   Chlorhexidine Gluconate Cloth 2 % PADS 6 each  6 each Topical Q0600 Reesa Chew, MD   6 each at 08/08/21 0612   Darbepoetin Alfa (ARANESP) injection 60 mcg  60 mcg Intravenous Q Sat-HD Reesa Chew, MD       prochlorperazine (COMPAZINE) injection 10 mg  10 mg Intravenous Once Idamae Schuller, MD       Followed by   diphenhydrAMINE (BENADRYL) injection 12.5 mg  12.5 mg Intravenous Once Idamae Schuller, MD       hydrALAZINE (APRESOLINE) tablet 50 mg  50 mg Oral Q8H Demaio, Alexa, MD   50 mg at 08/08/21 0610   HYDROmorphone (DILAUDID) tablet 1 mg  1 mg Oral Q4H PRN Demaio, Alexa, MD   1 mg at 08/08/21 0242   influenza vaccine adjuvanted (FLUAD) injection 0.5 mL  0.5 mL Intramuscular Tomorrow-1000 Angelica Pou, MD       losartan (COZAAR) tablet 50 mg  50 mg Oral Daily Reesa Chew, MD   50 mg at 08/07/21 0908   multivitamin (RENA-VIT) tablet 1 tablet  1 tablet Oral QHS Angelica Pou, MD  1 tablet at 08/07/21 2140   pantoprazole (PROTONIX) injection 40 mg  40 mg Intravenous Q12H Lacinda Axon, MD   40 mg at 08/07/21 2139   pneumococcal 23 valent vaccine (PNEUMOVAX-23) injection 0.5 mL  0.5 mL Intramuscular Tomorrow-1000 Angelica Pou, MD       polyethylene glycol (MIRALAX / GLYCOLAX) packet 17 g  17 g Oral Daily PRN Demaio, Alexa, MD       ramelteon (ROZEREM) tablet 8 mg  8 mg Oral QHS PRN Sanjuan Dame, MD   8 mg at 08/07/21 0030   senna (SENOKOT) tablet 8.6 mg  1 tablet Oral Daily Demaio, Alexa, MD       sucralfate (CARAFATE) 1 GM/10ML  suspension 1 g  1 g Oral TID WC & HS Idamae Schuller, MD   1 g at 08/07/21 2140   trimethobenzamide (TIGAN) capsule 300 mg  300 mg Oral Q8H PRN Idamae Schuller, MD          Review of Systems: 10 systems reviewed and negative except per interval history/subjective  Physical Exam: Vitals:   08/08/21 0930 08/08/21 1000  BP: (!) 163/93 (!) 169/85  Pulse: 72 77  Resp:    Temp:    SpO2:     No intake/output data recorded.  Intake/Output Summary (Last 24 hours) at 08/08/2021 1024 Last data filed at 08/08/2021 0600 Gross per 24 hour  Intake 250 ml  Output --  Net 250 ml   Constitutional: Lying in bed, no distress ENMT: ears and nose without scars or lesions, MMM, poor dentition CV: normal rate, no edema Chest: [facility] in place with no bleeding Respiratory: Bilateral chest rise, normal work of breathing Gastrointestinal: soft, nondistended Skin: no visible lesions or rashes Psych: alert, judgement/insight appropriate, appropriate mood and affect   Test Results I personally reviewed new and old clinical labs and radiology tests Lab Results  Component Value Date   NA 132 (L) 08/08/2021   K 4.3 08/08/2021   CL 104 08/08/2021   CO2 22 08/08/2021   BUN 42 (H) 08/08/2021   CREATININE 4.54 (H) 08/08/2021   CALCIUM 8.5 (L) 08/08/2021   ALBUMIN 2.8 (L) 08/06/2021   PHOS 4.4 08/02/2021

## 2021-08-08 NOTE — Anesthesia Postprocedure Evaluation (Signed)
Anesthesia Post Note  Patient: DONALEE GAUMOND  Procedure(s) Performed: ESOPHAGOGASTRODUODENOSCOPY (EGD) WITH PROPOFOL BIOPSY     Patient location during evaluation: Endoscopy Anesthesia Type: MAC Level of consciousness: awake and alert Pain management: pain level controlled Vital Signs Assessment: post-procedure vital signs reviewed and stable Respiratory status: spontaneous breathing, nonlabored ventilation, respiratory function stable and patient connected to nasal cannula oxygen Cardiovascular status: stable and blood pressure returned to baseline Postop Assessment: no apparent nausea or vomiting Anesthetic complications: no   No notable events documented.  Last Vitals:  Vitals:   08/08/21 1440 08/08/21 1445  BP:  135/78  Pulse: 68 69  Resp: 18 18  Temp:    SpO2: 96% 99%    Last Pain:  Vitals:   08/08/21 1445  TempSrc:   PainSc: 0-No pain                 Belenda Cruise P Darus Hershman

## 2021-08-08 NOTE — Anesthesia Procedure Notes (Signed)
Procedure Name: MAC Date/Time: 08/08/2021 2:05 PM Performed by: Barrington Ellison, CRNA Pre-anesthesia Checklist: Patient identified, Emergency Drugs available, Suction available, Patient being monitored and Timeout performed Patient Re-evaluated:Patient Re-evaluated prior to induction Oxygen Delivery Method: Nasal cannula

## 2021-08-08 NOTE — Op Note (Signed)
[facility] Patient Name: Audrey Hill Procedure Date : 08/08/2021 MRN: [id number] Attending MD: Georgian Co ,  Date of Birth: [birth year] CSN: [id number] Age: 65 Admit Type: Inpatient Procedure:                Upper GI endoscopy Indications:              Abdominal pain, Dysphagia Providers:                Adline Mango" Carmin Richmond, RN,                            Ladona Ridgel, Technician, Lodema Hong                            Technician, Technician Referring MD:             Hospitalist team Medicines:                Monitored Anesthesia Care Complications:            No immediate complications. Estimated Blood Loss:     Estimated blood loss was minimal. Procedure:                Pre-Anesthesia Assessment:                           - Prior to the procedure, a History and Physical                            was performed, and patient medications and                            allergies were reviewed. The patient's tolerance of                            previous anesthesia was also reviewed. The risks                            and benefits of the procedure and the sedation                            options and risks were discussed with the patient.                            All questions were answered, and informed consent                            was obtained. Prior Anticoagulants: The patient has                            taken Eliquis (apixaban). ASA Grade Assessment: III                            - A patient with severe systemic disease. After  reviewing the risks and benefits, the patient was                            deemed in satisfactory condition to undergo the                            procedure.                           After obtaining informed consent, the endoscope was                            passed under direct vision. Throughout the                            procedure, the patient's  blood pressure, pulse, and                            oxygen saturations were monitored continuously. The                            [id number]-H190 ([id number] Olympus endoscope was introduced                            through the mouth, and advanced to the second part                            of duodenum. The upper GI endoscopy was                            accomplished without difficulty. The patient                            tolerated the procedure well. Scope In: Scope Out: Findings:      The examined esophagus was normal.      Localized inflammation characterized by erosions and erythema was found       at the pylorus. Biopsies were taken with a cold forceps for histology.      Localized nodular mucosa was found in the duodenal bulb. Biopsies were       taken with a cold forceps for histology. Impression:               - Normal esophagus.                           - Gastritis. Biopsied.                           - Nodular mucosa in the duodenal bulb. Biopsied. Recommendation:           - Return patient to hospital ward for ongoing care.                           - No obvious anatomic cause for her dysphagia was  found. Her gastritis could be contributing to some                            abdominal pain issues.                           - Use a proton pump inhibitor PO BID for 8 weeks.                           - Await pathology results.                           - Return to GI clinic at appointment to be                            scheduled.                           - The findings and recommendations were discussed                            with the patient and/or primary team Procedure Code(s):        --- Professional ---                           [phone], Esophagogastroduodenoscopy, flexible,                            transoral; with biopsy, single or multiple Diagnosis Code(s):        --- Professional ---                           K29.70, Gastritis,  unspecified, without bleeding                           K31.89, Other diseases of stomach and duodenum                           R10.9, Unspecified abdominal pain                           R13.10, Dysphagia, unspecified CPT copyright 2019 American Medical Association. All rights reserved. The codes documented in this report are preliminary and upon coder review may  be revised to meet current compliance requirements. [address]Christia Reading,  08/08/2021 2:28:02 PM Number of Addenda: 0

## 2021-08-08 NOTE — Progress Notes (Signed)
[facility] [place]  Subjective  -seen in dialysis.  No complaints.   Objective (!) 169/85 77 98.2 F (36.8 C) (Oral) 16 100%  Intake/Output Summary (Last 24 hours) at 08/08/2021 1026 Last data filed at 08/08/2021 0600 Gross per 24 hour  Intake 250 ml  Output --  Net 250 ml    Bilateral radial brachial pulses palpable  Laboratory Lab Results: Recent Labs    08/07/21 0632 08/08/21 0200  WBC 4.9 5.3  HGB 7.5* 8.3*  HCT 24.0* 25.9*  PLT 178 207   BMET Recent Labs    08/07/21 0632 08/08/21 0200  NA 137 132*  K 4.2 4.3  CL 106 104  CO2 24 22  GLUCOSE 82 79  BUN 38* 42*  CREATININE 4.30* 4.54*  CALCIUM 8.5* 8.5*    COAG Lab Results  Component Value Date   INR 2.99 (H) 11/03/2015   INR 2.07 (H) 11/18/2014   INR 1.80 (H) 11/17/2014   No results found for: PTT  Assessment/Planning:  65 year old female admitted with hypertensive emergency with 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  Vascular surgery was consulted for dialysis access placement.  Plan left arm AV fistula versus graft on Tuesday in the OR with Dr. Virl Cagey.  Her vein mapping would suggest she will be at high risk for requiring graft placement which we discussed.  We will certainly reevaluate with ultrasound to the OR to see if she has any other options for vein.  Marty Heck 08/08/2021 10:26 AM --

## 2021-08-09 ENCOUNTER — Encounter (HOSPITAL_COMMUNITY): Payer: Self-pay | Admitting: Internal Medicine

## 2021-08-09 DIAGNOSIS — I160 Hypertensive urgency: Principal | ICD-10-CM

## 2021-08-09 DIAGNOSIS — R1310 Dysphagia, unspecified: Secondary | ICD-10-CM

## 2021-08-09 DIAGNOSIS — R1013 Epigastric pain: Secondary | ICD-10-CM

## 2021-08-09 MED ORDER — DIPHENHYDRAMINE HCL 25 MG PO CAPS
25.0000 mg | ORAL_CAPSULE | Freq: Four times a day (QID) | ORAL | Status: DC | PRN
  Administered 2021-08-09 – 2021-08-12 (×2): 25 mg via ORAL
  Filled 2021-08-09 (×2): qty 1

## 2021-08-09 MED ORDER — HYDROCORTISONE 1 % EX CREA
TOPICAL_CREAM | CUTANEOUS | Status: DC | PRN
  Administered 2021-08-09: 1 via TOPICAL
  Filled 2021-08-09: qty 28

## 2021-08-09 NOTE — Progress Notes (Signed)
Nephrology Follow-Up Consult note   Assessment/Recommendations: Audrey Hill is a/an 65 y.o. 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  She was hesitant to start dialysis but accepting now. She has had issues with compliance but because she remains fairly functional she is a suitable candidate for dialysis -Appreciate TDC placement by IR -She will be TTS outpatient at [facility] -Next HD on Tuesday -She is agreeable to AVF/AVG; appreciate help from Dr. Carlis Abbott; tentative plan for AVF/G on Tuesday. Likely remain inpatient until after she has this done.   Epigastric Pain/Nausea and Vomiting: being treated for reflux per primary. Uremia likely playing some role in nausea. EGD today   Hypertension: Blood pressure continues to be slightly high but overall improved.  Continue current medications.    Afib: meds per primary   Anemia of CKD: also blood loss possible. Maybe PUD with epigastric pain. Iron depleted.  Required transfusion on 11/2.  Continue aranesp 75mcg.  Secondary Hyperparathyroidism: PTH 281. Calcium and phos acceptable. No treatment  Concern for amyloid: on Echo. W/u per primary   Recommendations conveyed to primary service.    [facility] 08/09/2021 9:12 AM  ___________________________________________________________  CC: ESRD  Interval History/Subjective: Patient states she feels better today.  Abdominal pain has improved.  EGD with gastritis and nodular mucosa in the duodenum.   Medications:  Current Facility-Administered Medications  Medication Dose Route Frequency Provider Last Rate Last Admin   0.9 %  sodium chloride infusion   Intravenous PRN Angelica Pou, MD 10 mL/hr at  08/03/21 0139 New Bag at 08/03/21 0139   acetaminophen (TYLENOL) tablet 650 mg  650 mg Oral Q6H PRN Sanjuan Dame, MD       atorvastatin (LIPITOR) tablet 20 mg  20 mg Oral Daily Idamae Schuller, MD   20 mg at 08/09/21 0843   benzonatate (TESSALON) capsule 100 mg  100 mg Oral TID PRN Sanjuan Dame, MD   100 mg at 08/04/21 1113   carvedilol (COREG) tablet 12.5 mg  12.5 mg Oral BID WC Demaio, Alexa, MD   12.5 mg at 08/09/21 0843   Chlorhexidine Gluconate Cloth 2 % PADS 6 each  6 each Topical Q0600 Reesa Chew, MD   6 each at 08/09/21 0846   Darbepoetin Alfa (ARANESP) injection 60 mcg  60 mcg Intravenous Q Sat-HD Reesa Chew, MD   60 mcg at 08/08/21 1114   prochlorperazine (COMPAZINE) injection 10 mg  10 mg Intravenous Once Idamae Schuller, MD       Followed by   diphenhydrAMINE (BENADRYL) injection 12.5 mg  12.5 mg Intravenous Once Idamae Schuller, MD       hydrALAZINE (APRESOLINE) tablet 50 mg  50 mg Oral Q8H Demaio, Alexa, MD   50 mg at 08/09/21 0655   hydrocortisone cream 1 %   Topical PRN Angelica Pou, MD   1 application at 14/48/18 0656   HYDROmorphone (DILAUDID) tablet 1 mg  1 mg Oral Q4H PRN Demaio, Alexa, MD   1 mg at 08/09/21 0538   influenza vaccine adjuvanted (FLUAD) injection 0.5 mL  0.5 mL Intramuscular Tomorrow-1000 Angelica Pou, MD       losartan (COZAAR) tablet 50 mg  50 mg Oral Daily Santiago Bumpers  J, MD   50 mg at 08/09/21 0843   multivitamin (RENA-VIT) tablet 1 tablet  1 tablet Oral QHS Angelica Pou, MD   1 tablet at 08/08/21 2255   pantoprazole (PROTONIX) EC tablet 40 mg  40 mg Oral BID Lacinda Axon, MD   40 mg at 08/09/21 0844   pneumococcal 23 valent vaccine (PNEUMOVAX-23) injection 0.5 mL  0.5 mL Intramuscular Tomorrow-1000 Angelica Pou, MD       polyethylene glycol (MIRALAX / GLYCOLAX) packet 17 g  17 g Oral Daily PRN Demaio, Alexa, MD       ramelteon (ROZEREM) tablet 8 mg  8 mg Oral QHS PRN Sanjuan Dame, MD   8 mg at  08/08/21 2255   senna (SENOKOT) tablet 8.6 mg  1 tablet Oral Daily Demaio, Alexa, MD       sucralfate (CARAFATE) 1 GM/10ML suspension 1 g  1 g Oral TID WC & HS Idamae Schuller, MD   1 g at 08/09/21 0843   trimethobenzamide (TIGAN) capsule 300 mg  300 mg Oral Q8H PRN Idamae Schuller, MD          Review of Systems: 10 systems reviewed and negative except per interval history/subjective  Physical Exam: Vitals:   08/09/21 0843 08/09/21 0844  BP: (!) 150/87   Pulse: 74 74  Resp:  18  Temp:  97.9 F (36.6 C)  SpO2:     No intake/output data recorded.  Intake/Output Summary (Last 24 hours) at 08/09/2021 0912 Last data filed at 08/09/2021 0354 Gross per 24 hour  Intake 410 ml  Output 1800 ml  Net -1390 ml   Constitutional: Lying in bed, no distress ENMT: ears and nose without scars or lesions, MMM, poor dentition CV: normal rate, no edema Chest: [facility] in place with no bleeding Respiratory: Bilateral chest rise, normal work of breathing Gastrointestinal: soft, nondistended Skin: no visible lesions or rashes Psych: alert, judgement/insight appropriate, appropriate mood and affect   Test Results I personally reviewed new and old clinical labs and radiology tests Lab Results  Component Value Date   NA 137 08/08/2021   K 3.5 08/08/2021   CL 101 08/08/2021   CO2 22 08/08/2021   BUN 19 08/08/2021   CREATININE 2.70 (H) 08/08/2021   CALCIUM 8.5 (L) 08/08/2021   ALBUMIN 2.8 (L) 08/06/2021   PHOS 4.4 08/02/2021

## 2021-08-09 NOTE — Progress Notes (Signed)
HD#6 Subjective:  Overnight Events: NAEO  Patient was evaluated at the bedside sitting comfortably in bed. Denies any acute complaints. States his stomach pain is better. She does endorse some itching along the side of the [facility].  Objective:  Vital signs in last 24 hours: Vitals:   08/09/21 0348 08/09/21 0455 08/09/21 0843 08/09/21 0844  BP:  (!) 155/86 (!) 150/87   Pulse: 74  74 74  Resp: 16   18  Temp: 98.1 F (36.7 C)   97.9 F (36.6 C)  TempSrc: Oral   Oral  SpO2: 99%     Weight: 62.2 kg     Height:       Supplemental O2: Room Air SpO2: 99 % O2 Flow Rate (L/min): 3 L/min   Physical Exam:  Constitutional: Mildly cachectic elderly woman sitting in bed.  NAD. Cardiovascular: RRR. No m/r/g. No LE edema. Pulmonary/Chest: Lungs CTAB. No wheezing or rales. Abdominal: Soft. ND/NT. Normal bowel sounds. MSK: normal bulk and tone Neurological: Alert and oriented x3. No focal deficits. Skin: warm and dry Psych: Appropriate mood and affect.  Filed Weights   08/08/21 0735 08/08/21 1121 08/09/21 0348  Weight: 62.5 kg 61.5 kg 62.2 kg     Intake/Output Summary (Last 24 hours) at 08/09/2021 1117 Last data filed at 08/09/2021 0955 Gross per 24 hour  Intake 410 ml  Output 1300 ml  Net -890 ml   Net IO Since Admission: -3,165 mL [08/09/21 1117]  Pertinent Labs: CBC Latest Ref Rng & Units 08/08/2021 08/08/2021 08/07/2021  WBC 4.0 - 10.5 K/uL - 5.3 4.9  Hemoglobin 12.0 - 15.0 g/dL 7.8(L) 8.3(L) 7.5(L)  Hematocrit 36.0 - 46.0 % 23.0(L) 25.9(L) 24.0(L)  Platelets 150 - 400 K/uL - 207 178    CMP Latest Ref Rng & Units 08/08/2021 08/08/2021 08/07/2021  Glucose 70 - 99 mg/dL 84 79 82  BUN 8 - 23 mg/dL 19 42(H) 38(H)  Creatinine 0.44 - 1.00 mg/dL 2.70(H) 4.54(H) 4.30(H)  Sodium 135 - 145 mmol/L 137 132(L) 137  Potassium 3.5 - 5.1 mmol/L 3.5 4.3 4.2  Chloride 98 - 111 mmol/L 101 104 106  CO2 22 - 32 mmol/L - 22 24  Calcium 8.9 - 10.3 mg/dL - 8.5(L) 8.5(L)  Total Protein 6.5 - 8.1  g/dL - - -  Total Bilirubin 0.3 - 1.2 mg/dL - - -  Alkaline Phos 38 - 126 U/L - - -  AST 15 - 41 U/L - - -  ALT 0 - 44 U/L - - -    Imaging: No results found.  Assessment/Plan:   Active Problems:   Goals of care, counseling/discussion   Epigastric pain   Hypertensive urgency   Esophagitis   Malnutrition of moderate degree   Abdominal pain   Dysphagia  Patient Summary: Audrey Hill is a 65 y.o.  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  Nephrology still following. Kidney function remained stable. Next dialysis session on Tuesday. --Nephro following, appreciate assistance with HD --HD on Tuesday --AVF/AVG scheduled for Tuesday with VSS -- Benadryl 25 mg as needed for itching around [facility] site --Avoid nephrotoxic agents   #Gastritis Patient reports improvement in abdominal pain today. She will follow-up with GI in the outpatient to  evaluate for possible dysmotility disorder. --Continue Protonix 40 mg twice daily for 8 weeks --Continue Dilaudid 1 mg q4h prn --Continue Carafate 1g TID and qhs  #Hypertensive Urgency #Headache BP continues to be up-and-down.  SBP in the 150s to 160s. --Continue Coreg 12.5 mg twice daily --Continue hydralazine 50 mg q8hr --Continue losartan 50 mg daily  #Anemia Hemoglobin stable at 8.3.  No signs of active bleed. --Aranesp 60 per nephro --Daily CBC --Transfuse <7   #CHF #Concern for amyloidosis She states her chest pain is improved since admission. BNP was 2,292 on 08/02/21. Echo showed EF is 30 to 35 %. LV cavity is dilated. LV shows a Grade III diastolic dysfunction (restrictive) with findings concerning for amyloidosis. Limited in evaluation with contrast due  to ESRD, however as patient is now on HD can try and coordinate cardiac MRI with dialysis schedule.  --Coreg 12.5 BID, losartan 25 --Cardiology referral at discharge --Strict I/O --Daily weights --K>4, Mg >2   #Paroxysmal Atrial Fibrillation Stable. CHA2DS2-VASc Score = 4. Continues to be in normal sinus with heart rate in the 60s to 80s.   --Continue to hold Eliquis, can restart if hemoglobin remains stable  #Hx of CVA #Hyperlipidemia Stable. --Continue home statin --Hold eliquis as above  #Goals of care Patient with end-stage heart failure and end-stage renal disease. Patient has established with [facility] in the outpatient. Remains DNI. --[facility] Palliative team will follow up with patient after discharge.   #Tunneled Catheter Bleed; resolved   Diet: Advance as tolerated IVF: None,None VTE: None Code: DNI  Prior to Admission Living Arrangement: Home Anticipated Discharge: Home Barriers to Discharge: Scheduled permanent HD access Dispo: Anticipated discharge to Home in 2 to 3 days pending permanent access for HD   Lacinda Axon, MD 08/09/2021, 11:17 AM IM Resident, PGY-2 Pager: [phone] Internal Walker 41:10  Please contact the on call pager after 5 pm and on weekends at [phone].

## 2021-08-10 ENCOUNTER — Telehealth: Payer: Self-pay

## 2021-08-10 LAB — CBC
HCT: 23.6 % — ABNORMAL LOW (ref 36.0–46.0)
Hemoglobin: 7.4 g/dL — ABNORMAL LOW (ref 12.0–15.0)
MCH: 27.3 pg (ref 26.0–34.0)
MCHC: 31.4 g/dL (ref 30.0–36.0)
MCV: 87.1 fL (ref 80.0–100.0)
Platelets: 177 10*3/uL (ref 150–400)
RBC: 2.71 MIL/uL — ABNORMAL LOW (ref 3.87–5.11)
RDW: 13.3 % (ref 11.5–15.5)
WBC: 4.5 10*3/uL (ref 4.0–10.5)
nRBC: 0 % (ref 0.0–0.2)

## 2021-08-10 LAB — BASIC METABOLIC PANEL
Anion gap: 6 (ref 5–15)
BUN: 34 mg/dL — ABNORMAL HIGH (ref 8–23)
CO2: 25 mmol/L (ref 22–32)
Calcium: 8.3 mg/dL — ABNORMAL LOW (ref 8.9–10.3)
Chloride: 107 mmol/L (ref 98–111)
Creatinine, Ser: 4.5 mg/dL — ABNORMAL HIGH (ref 0.44–1.00)
GFR, Estimated: 10 mL/min — ABNORMAL LOW (ref >60)
Glucose, Bld: 86 mg/dL (ref 70–99)
Potassium: 3.8 mmol/L (ref 3.5–5.1)
Sodium: 138 mmol/L (ref 135–145)

## 2021-08-10 LAB — SURGICAL PCR SCREEN
MRSA, PCR: NEGATIVE
Staphylococcus aureus: NEGATIVE

## 2021-08-10 LAB — MAGNESIUM: Magnesium: 2 mg/dL (ref 1.7–2.4)

## 2021-08-10 MED ORDER — HEPARIN SODIUM (PORCINE) 1000 UNIT/ML IJ SOLN
3200.0000 [IU] | Freq: Once | INTRAMUSCULAR | Status: AC
  Administered 2021-08-10: 3200 [IU] via INTRAVENOUS
  Filled 2021-08-10: qty 4

## 2021-08-10 MED ORDER — CHLORHEXIDINE GLUCONATE CLOTH 2 % EX PADS: 6.0000 | MEDICATED_PAD | Freq: Every day | CUTANEOUS | Status: DC

## 2021-08-10 MED ORDER — HYDROMORPHONE HCL 2 MG PO TABS
1.0000 mg | ORAL_TABLET | Freq: Four times a day (QID) | ORAL | Status: DC | PRN
  Administered 2021-08-10 (×2): 1 mg via ORAL
  Filled 2021-08-10 (×2): qty 1

## 2021-08-10 MED ORDER — DARBEPOETIN ALFA 100 MCG/0.5ML IJ SOSY: 100.0000 ug | PREFILLED_SYRINGE | INTRAMUSCULAR | Status: DC

## 2021-08-10 NOTE — Progress Notes (Addendum)
  Progress Note    08/10/2021 7:35 AM 2 Days Post-Op  Patient with ESRD She is scheduled for left AVG vs AVF NPO after midnight Consent order placed  Marval Regal [facility] [phone] 08/10/2021 7:35 AM  I have seen and evaluated the patient. I agree with the PA note as documented above.  Plan left arm AV fistula versus graft tomorrow in the OR.  Please keep n.p.o. after midnight.  Risk benefits discussed.  Consent order placed.  Will be high risk for requiring graft as previously discussed based on vein mapping.  Marty Heck, MD [facility] [place] Office: [phone]

## 2021-08-10 NOTE — Evaluation (Signed)
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 Upper GI 10/5   Clinical Impression   This 65 yo female admitted with above presents to acute OT with PLOF of being independent with basic ADLs and some IADLs but had not been taking care of herself recently due to passing of a family member. Currently she is at a S level for basic ADLs. She will continue to benefit from acute OT with follow up [place].      Recommendations for follow up therapy are one component of a multi-disciplinary discharge planning process, led by the attending physician.  Recommendations may be updated based on patient status, additional functional criteria and insurance authorization.   Follow Up Recommendations  Other (comment) ([place]HAide)    Assistance Recommended at Discharge Intermittent Supervision/Assistance  Functional Status Assessment  Patient has had a recent decline in their functional status and demonstrates the ability to make significant improvements in function in a reasonable and predictable amount of time.        Precautions / Restrictions Precautions Precautions: Fall Restrictions Weight Bearing Restrictions: No      Mobility Bed Mobility Overal bed mobility: Independent                  Transfers Overall transfer level: Needs assistance Equipment used: None Transfers: Sit to/from Stand Sit to Stand: Supervision                  Balance Overall balance assessment: Mild deficits observed, not formally tested                                          ADL either performed or assessed with clinical judgement   ADL Overall ADL's : Needs assistance/impaired Eating/Feeding: Independent;Sitting   Grooming: Supervision/safety;Standing   Upper Body Bathing: Supervision/ safety;Sitting   Lower Body Bathing: Supervison/ safety;Sit to/from stand   Upper Body Dressing : Supervision/safety;Sitting   Lower Body Dressing: Supervision/safety;Sit to/from stand   Toilet Transfer: Supervision/safety;Ambulation;Comfort height toilet;Grab bars   Toileting- Clothing Manipulation and Hygiene: Supervision/safety;Sit to/from stand               Vision Baseline Vision/History: 1 Wears glasses Ability to See in Adequate Light: 1 Impaired (reports she feels like she needs new ones)              Pertinent Vitals/Pain Pain Assessment: Faces Faces Pain Scale: Hurts whole lot Pain Location: right leg (groin), lower back Pain Descriptors / Indicators: Aching Pain Intervention(s): Limited activity within patient's tolerance;Monitored during session;Repositioned;Patient requesting pain meds-RN notified     Hand Dominance Right   Extremity/Trunk Assessment Upper Extremity Assessment Upper Extremity Assessment: Overall WFL for tasks assessed (it was discussed when she has her fistula put in she needs to not push, pull, or lift with that arm until cleared by MD to do so)           Communication Communication Communication: No difficulties   Cognition Arousal/Alertness: Awake/alert ("but tired") Behavior During Therapy: WFL for tasks assessed/performed Overall  Cognitive Status: Within Functional Limits for tasks assessed                                                  Home Living Family/patient expects to be discharged to:: Private residence Living Arrangements: Alone Available Help at Discharge: Family;Available PRN/intermittently Type of Home: Apartment Home Access: Stairs to enter Entrance  Stairs-Number of Steps: 2-3 Entrance Stairs-Rails: Right;Left;Can reach both Home Layout: One level     Bathroom Shower/Tub: Tub/shower unit;Curtain   Bathroom Toilet: Handicapped height     Home Equipment: Conservation officer, nature (2 wheels);Cane - single point          Prior Functioning/Environment Prior Level of Function : Independent/Modified Independent                        OT Problem List: Impaired balance (sitting and/or standing)      OT Treatment/Interventions: Self-care/ADL training;DME and/or AE instruction;Patient/family education;Balance training    OT Goals(Current goals can be found in the care plan section) Acute Rehab OT Goals Patient Stated Goal: to get fistula tomorrow and go home OT Goal Formulation: With patient Time For Goal Achievement: 08/24/21 Potential to Achieve Goals: Good  OT Frequency: Min 2X/week           Co-evaluation PT/OT/SLP Co-Evaluation/Treatment: Yes (partial) Reason for Co-Treatment: To address functional/ADL transfers PT goals addressed during session: Mobility/safety with mobility;Balance OT goals addressed during session: Strengthening/ROM;ADL's and self-care      AM-PAC OT "6 Clicks" Daily Activity     Outcome Measure Help from another person eating meals?: None Help from another person taking care of personal grooming?: A Little Help from another person toileting, which includes using toliet, bedpan, or urinal?: A Little Help from another person bathing (including washing, rinsing, drying)?: A Little Help from another person to put on and taking off regular upper body clothing?: A Little Help from another person to put on and taking off regular lower body clothing?: A Little 6 Click Score: 19   End of Session Nurse Communication: Patient requests pain meds (follow up recommendations)  Activity Tolerance: Patient tolerated treatment well Patient left: in bed  OT Visit Diagnosis: Unsteadiness on feet (R26.81);Muscle  weakness (generalized) (M62.81);Pain Pain - part of body:  (leg and back)                Time: 4270-6237 OT Time Calculation (min): 18 min Charges:  OT General Charges $OT Visit: 1 Visit OT Evaluation $OT Eval Moderate Complexity: 1 Mod  [place] [address], OTR/L Acute [employer] Pager [phone] Office [phone]    Almon Register 08/10/2021, 3:46 PM

## 2021-08-10 NOTE — Progress Notes (Signed)
OT Cancellation Note  Patient Details Name: Audrey Hill MRN: [id number] DOB: 11-May-1956   Cancelled Treatment:    Reason Eval/Treat Not Completed: Patient at procedure or test/ unavailable (in HD)  [facility] 08/10/2021, 9:49 AM Maurie Boettcher, OT 08/10/2021

## 2021-08-10 NOTE — TOC Initial Note (Signed)
Transition of Care [facility]) - Initial/Assessment Note    Patient Details  Name: Audrey Hill MRN: [id number] Date of Birth: 04-Mar-1956  Transition of [facility]) CM/SW Contact:    Bartholomew Crews, RN Phone Number: [phone] 08/10/2021, 5:09 PM  Clinical Narrative:                  Spoke with patient at the bedside to discuss transition plans. PTA home alone. Stated that she lives in section 8 housing, and she has put in request with social services for assistance with her electric bill while she is in the hospital. Currently her cell phone is off, but she stated that it should be back on tonight. Demographics verified.  Discussed need to complete transportation assessment for Medicaid transportation for dialysis. Patient has been having difficulty connecting with Medicaid d/t hospital phone disconnects when someone finally comes on the line. CM called DSS transportation to submit request for call back. Patient should expect call from her [place] Medicaid worker, Audrey Hill, within the next 24-48 hours.   Discussed need for home health services. Referral for PT/OT/Aide/SW accepted by [facility]. Patient will need HH orders with Face to Face orders.   TOC following for transition needs.   Expected Discharge Plan: (P) [place] Barriers to Discharge: (P) Continued Medical Work up   Patient Goals and CMS Choice Patient states their goals for this hospitalization and ongoing recovery are:: (P) return home CMS Medicare.gov Compare Post Acute Care list provided to:: (P) Patient Choice offered to / list presented to : (P) Patient  Expected Discharge Plan and Services Expected Discharge Plan: (P) [place]   Discharge Planning Services: (P) CM Consult Post Acute Care Choice: (P) [place] arrangements for the past 2 months: (P) Apartment                           HH Arranged: (P) PT, OT, Nurse's Aide, Social Work [employer] Agency: (P)  [place] Date [place]: (P) 08/10/21 Time HH Agency Contacted: (P) 1641 Representative spoke with at [place]: (P) Angie  Prior Living Arrangements/Services Living arrangements for the past 2 months: (P) Apartment Lives with:: (P) Self                   Activities of Daily Living Home Assistive Devices/Equipment: None ADL Screening (condition at time of admission) Patient's cognitive ability adequate to safely complete daily activities?: Yes Is the patient deaf or have difficulty hearing?: No Does the patient have difficulty seeing, even when wearing glasses/contacts?: No Does the patient have difficulty concentrating, remembering, or making decisions?: No Patient able to express need for assistance with ADLs?: Yes Does the patient have difficulty dressing or bathing?: No Independently performs ADLs?: Yes (appropriate for developmental age) Does the patient have difficulty walking or climbing stairs?: No Weakness of Legs: None Weakness of Arms/Hands: None  Permission Sought/Granted                  Emotional Assessment              Admission diagnosis:  Epigastric pain [R10.13] Hypertensive urgency [I16.0] Patient Active Problem List   Diagnosis Date Noted   Malnutrition of moderate degree 08/08/2021   Abdominal pain    Dysphagia    Esophagitis    Hypertensive urgency 08/02/2021   Epigastric pain    Duodenitis 11/14/2014   Hematemesis 11/13/2014  Asthma, chronic 11/13/2014   Nausea vomiting and diarrhea 11/13/2014   History of stroke 11/13/2014   A-fib ([place]) 11/13/2014   AKI (acute kidney injury) ([place]) 11/13/2014   Elevated lipase 11/13/2014   Influenza-like illness 09/29/2013   Goals of care, counseling/discussion 06/18/2013   Hypertension    Hyperlipidemia    GERD (gastroesophageal reflux disease)    Hepatitis    PCP:  Elizabeth Palau, MD (Inactive) Pharmacy:   [facility] [place] [address] Phone: [phone] Fax: [phone]     Social Determinants of Health (SDOH) Interventions    Readmission Risk Interventions No flowsheet data found.

## 2021-08-10 NOTE — Telephone Encounter (Signed)
Per Dr. Libby Maw request, pt has been scheduled for hosp f/u dysphagia on 09/10/21 @ 230pm. Appt reminder has been mailed. Appt will reflect on AVS upon hosp discharge for pt future reference.

## 2021-08-10 NOTE — Progress Notes (Signed)
PT Cancellation Note  Patient Details Name: Audrey Hill MRN: [id number] DOB: 05/05/1956   Cancelled Treatment:    Reason Eval/Treat Not Completed: Patient at procedure or test/unavailable In HD; will follow up as schedule allows.   Lou Miner, DPT  Acute Rehabilitation Services  Pager: [phone] Office: [phone]   Rudean Hitt 08/10/2021, 9:51 AM

## 2021-08-10 NOTE — Care Management Important Message (Signed)
Important Message  Patient Details  Name: Audrey Hill MRN: [id number] Date of Birth: [birth year]   Medicare Important Message Given:  Yes     Shelda Altes 08/10/2021, 10:38 AM

## 2021-08-10 NOTE — Progress Notes (Signed)
MD responded to message and placed orders for CBC, BMP, Mg. Will continue to monitor patient and follow up with lab results.

## 2021-08-10 NOTE — Telephone Encounter (Signed)
-----   Message from Sharyn Creamer, MD sent at 08/08/2021  2:48 PM EDT ----- [place], please arrange for 1 month clinic follow up with me for dysphagia.  Thanks, Lyndee Leo

## 2021-08-10 NOTE — Progress Notes (Signed)
CSW called medicaid transportation to try and assist with setting up patients transportation to dialysis. Medicaid transportation informed CSW that patient needs to update her assessment before being able to schedule transportation. Medicaid transportation will call patient to complete assessment and confirmed with CSW they will give CSW a call back to confirm if transportation has been set up, or still needs to be set up. CSW will continue to follow and assist with patients dc planning needs.

## 2021-08-10 NOTE — Progress Notes (Signed)
HD#7 Subjective:  Overnight Events: NAEO   Patient is resting comfortably in dialysis watching TV. Says that her stomach continues to feel better. Still doesn't know how she feels about being on dialysis. She agrees that a lot has happened and changed with her health since coming into the hospital.  Objective:  Vital signs in last 24 hours: Vitals:   08/09/21 1426 08/09/21 1725 08/09/21 2042 08/10/21 0443  BP: 120/62 (!) 150/91 (!) 148/77 (!) 160/87  Pulse:  80 67 85  Resp:   19 18  Temp:   (!) 97.3 F (36.3 C) 98 F (36.7 C)  TempSrc:      SpO2:   98% 95%  Weight:    62.9 kg  Height:       Supplemental O2: Room Air SpO2: 95 % O2 Flow Rate (L/min): 3 L/min   Physical Exam:  Constitutional: Mildly cachectic elderly woman laying in bed.  NAD.  Cardiovascular: RRR. No m/r/g. No LE edema. Pulmonary/Chest: Lungs CTAB. No wheezing or rales. Abdominal: Soft. ND/NT. Normal bowel sounds. MSK: normal bulk and tone Neurological: Alert and oriented x3. No focal deficits. Skin: warm and dry Psych: Appropriate mood and affect.  Filed Weights   08/08/21 1121 08/09/21 0348 08/10/21 0443  Weight: 61.5 kg 62.2 kg 62.9 kg     Intake/Output Summary (Last 24 hours) at 08/10/2021 0647 Last data filed at 08/10/2021 0443 Gross per 24 hour  Intake 720 ml  Output 1050 ml  Net -330 ml   Net IO Since Admission: -2,995 mL [08/10/21 0647]  Pertinent Labs: CBC Latest Ref Rng & Units 08/10/2021 08/08/2021 08/08/2021  WBC 4.0 - 10.5 K/uL 4.5 - 5.3  Hemoglobin 12.0 - 15.0 g/dL 7.4(L) 7.8(L) 8.3(L)  Hematocrit 36.0 - 46.0 % 23.6(L) 23.0(L) 25.9(L)  Platelets 150 - 400 K/uL 177 - 207    CMP Latest Ref Rng & Units 08/10/2021 08/08/2021 08/08/2021  Glucose 70 - 99 mg/dL 86 84 79  BUN 8 - 23 mg/dL 34(H) 19 42(H)  Creatinine 0.44 - 1.00 mg/dL 4.50(H) 2.70(H) 4.54(H)  Sodium 135 - 145 mmol/L 138 137 132(L)  Potassium 3.5 - 5.1 mmol/L 3.8 3.5 4.3  Chloride 98 - 111 mmol/L 107 101 104  CO2 22 -  32 mmol/L 25 - 22  Calcium 8.9 - 10.3 mg/dL 8.3(L) - 8.5(L)  Total Protein 6.5 - 8.1 g/dL - - -  Total Bilirubin 0.3 - 1.2 mg/dL - - -  Alkaline Phos 38 - 126 U/L - - -  AST 15 - 41 U/L - - -  ALT 0 - 44 U/L - - -    Imaging: No results found.  Assessment/Plan:   Active Problems:   Goals of care, counseling/discussion   Epigastric pain   Hypertensive urgency   Esophagitis   Malnutrition of moderate degree   Abdominal pain   Dysphagia   Patient Summary: LASASHA BROPHY is a 65 y.o.  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  Nephrology still following. Kidney function remained stable. Next dialysis session on Tuesday. --Nephro following, appreciate assistance with HD --HD on Tuesday --AVF/AVG scheduled for Tuesday with VSS -- Benadryl 25 mg as needed for itching around [facility] site -- Avoid nephrotoxic agents   #  Gastritis She will follow-up with GI in the outpatient to evaluate for possible dysmotility disorder. Pain continues to improve, will decrease pain meds today and see how she does. --Continue Protonix 40 mg twice daily for 8 weeks --decrease Dilaudid 1 mg to q6h prn --Continue Carafate 1g TID and qhs   #Hypertensive Urgency #Headache BP continues to be up-and-down.  SBP in the 150s to 160s. --Continue Coreg 12.5 mg twice daily --Continue hydralazine 50 mg q8hr --Continue losartan 50 mg daily   #Anemia No signs of active bleed, gastritis noted on EGD. Hemoglobin 7.4 today from 7.8 --Aranesp 60 per nephro --Daily CBC --Transfuse <7   #CHF #Concern for amyloidosis She states her chest pain is improved since admission. BNP was 2,292 on 08/02/21. Echo showed EF is 30 to 35 %. LV cavity is  dilated. LV shows a Grade III diastolic dysfunction (restrictive) with findings concerning for amyloidosis. Limited in evaluation with contrast due to ESRD, however as patient is now on HD can try and coordinate cardiac MRI with dialysis schedule.  --Coreg 12.5 BID, losartan 25 --Cardiology referral at discharge --Strict I/O --Daily weights --K>4, Mg >2   #Paroxysmal Atrial Fibrillation Stable. CHA2DS2-VASc Score = 4. Had a run of afib overnight but in NSR again today.  --Continue to hold Eliquis, can restart if hemoglobin remains stable   #Hx of CVA #Hyperlipidemia Stable. --Continue home statin --Hold eliquis as above   #Goals of care Patient with end-stage heart failure and end-stage renal disease. Patient has established with [facility] in the outpatient. Remains DNI. --[facility] Palliative team will follow up with patient after discharge.   #Tunneled Catheter Bleed; resolved   Diet: Advance as tolerated IVF: None,None VTE: None Code: DNI  Prior to Admission Living Arrangement: Home Anticipated Discharge: Home Barriers to Discharge: Scheduled permanent HD access Dispo: Anticipated discharge to Home in 1-2 days after permanent access  [name], MD Internal Medicine Resident PGY-1 Pager [phone] Please contact the on call pager after 5 pm and on weekends at [phone].

## 2021-08-10 NOTE — Progress Notes (Signed)
Patient noted to have short burst afib rvr on monitor, returned to NSR spontaneously.   Also noted to have increase in PVCs.  Patient only c/o this shift is itching, soreness at HD cath site.  Text page sent to IMTS via amion.  Awaiting return call.

## 2021-08-10 NOTE — Progress Notes (Signed)
Nephrology Follow-Up note   Assessment/Recommendations: Audrey Hill is a/an 65 y.o. 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  She was hesitant to start dialysis but accepting now. She has had issues with compliance but because she remains fairly functional she is a suitable candidate for dialysis.  Appreciate TDC placement by IR -She will be TTS outpatient at [facility] - HD today given access surgery planned for 11/8.  No HD on 11/8.  Then anticipate next tx Thursday on her TTS schedule -She is agreeable to AVF/AVG; appreciate help from Dr. Carlis Abbott; this is scheduled for 11/8. to remain inpatient until after she has this done.   Epigastric Pain/Nausea and Vomiting: being treated for reflux per primary. Uremia likely playing some role in nausea. EGD with gastritis and nodular mucosa in the duodenum.    Hypertension: Blood pressure continues to be slightly high but overall improved.  Continue current medications.    Afib: meds per primary   Anemia of CKD: also blood loss possible. Maybe PUD with epigastric pain. Iron depleted.  Required transfusion on 11/2. Got ferrlecit on 11/1 aranesp 39mcg on 11/5 - would increase next dose to 100 mcg.  Secondary Hyperparathyroidism: PTH 281. Calcium and phos acceptable.   Concern for amyloid: on Echo. W/u per primary  Dispo - awaiting AVF/AVG placement on 11/8 with vascular   ___________________________________________________________    Interval History/Subjective:  had 1.1 liters UOP over 11/6.  Last HD on 11/5 with 1 kg UF.  She's willing to do HD today given that surgery is scheduled for tomorrow. No dizziness or cramping  Review of systems: Denies n/v Denies shortness of breath or chest  pain   Medications:  Current Facility-Administered Medications  Medication Dose Route Frequency Provider Last Rate Last Admin   0.9 %  sodium chloride infusion   Intravenous PRN Angelica Pou, MD 10 mL/hr at 08/03/21 0139 New Bag at 08/03/21 0139   acetaminophen (TYLENOL) tablet 650 mg  650 mg Oral Q6H PRN Sanjuan Dame, MD       atorvastatin (LIPITOR) tablet 20 mg  20 mg Oral Daily Idamae Schuller, MD   20 mg at 08/09/21 0843   benzonatate (TESSALON) capsule 100 mg  100 mg Oral TID PRN Sanjuan Dame, MD   100 mg at 08/04/21 1113   carvedilol (COREG) tablet 12.5 mg  12.5 mg Oral BID WC Demaio, Alexa, MD   12.5 mg at 08/09/21 1725   Chlorhexidine Gluconate Cloth 2 % PADS 6 each  6 each Topical Q0600 Reesa Chew, MD   6 each at 08/10/21 0655   Darbepoetin Alfa (ARANESP) injection 60 mcg  60 mcg Intravenous Q Sat-HD Reesa Chew, MD   60 mcg at 08/08/21 1114   diphenhydrAMINE (BENADRYL) capsule 25 mg  25 mg Oral Q6H PRN Lacinda Axon, MD   25 mg at 08/09/21 1203   prochlorperazine (COMPAZINE) injection 10 mg  10 mg Intravenous Once Idamae Schuller, MD       Followed by   diphenhydrAMINE (BENADRYL) injection 12.5 mg  12.5 mg Intravenous Once Idamae Schuller, MD       hydrALAZINE (APRESOLINE) tablet 50 mg  50 mg Oral Q8H Demaio, Alexa, MD   50 mg at 08/10/21 [phone]  hydrocortisone cream 1 %   Topical PRN Angelica Pou, MD   1 application at 62/13/08 0656   HYDROmorphone (DILAUDID) tablet 1 mg  1 mg Oral Q6H PRN Demaio, Alexa, MD       influenza vaccine adjuvanted (FLUAD) injection 0.5 mL  0.5 mL Intramuscular Tomorrow-1000 Angelica Pou, MD       losartan (COZAAR) tablet 50 mg  50 mg Oral Daily Reesa Chew, MD   50 mg at 08/09/21 6578   multivitamin (RENA-VIT) tablet 1 tablet  1 tablet Oral QHS Angelica Pou, MD   1 tablet at 08/09/21 2050   pantoprazole (PROTONIX) EC tablet 40 mg  40 mg Oral BID Lacinda Axon, MD   40 mg at 08/09/21 2050    pneumococcal 23 valent vaccine (PNEUMOVAX-23) injection 0.5 mL  0.5 mL Intramuscular Tomorrow-1000 Angelica Pou, MD       polyethylene glycol (MIRALAX / GLYCOLAX) packet 17 g  17 g Oral Daily PRN Demaio, Alexa, MD       ramelteon (ROZEREM) tablet 8 mg  8 mg Oral QHS PRN Sanjuan Dame, MD   8 mg at 08/09/21 2248   senna (SENOKOT) tablet 8.6 mg  1 tablet Oral Daily Demaio, Alexa, MD       sucralfate (CARAFATE) 1 GM/10ML suspension 1 g  1 g Oral TID WC & HS Idamae Schuller, MD   1 g at 08/09/21 2050   trimethobenzamide (TIGAN) capsule 300 mg  300 mg Oral Q8H PRN Idamae Schuller, MD          Physical Exam: Vitals:   08/09/21 2042 08/10/21 0443  BP: (!) 148/77 (!) 160/87  Pulse: 67 85  Resp: 19 18  Temp: (!) 97.3 F (36.3 C) 98 F (36.7 C)  SpO2: 98% 95%   No intake/output data recorded.  Intake/Output Summary (Last 24 hours) at 08/10/2021 0736 Last data filed at 08/10/2021 0600 Gross per 24 hour  Intake 840 ml  Output 1050 ml  Net -210 ml   Physical exam  General adult female in bed in no acute distress HEENT normocephalic atraumatic extraocular movements intact sclera anicteric Neck supple trachea midline Lungs clear to auscultation bilaterally normal work of breathing at rest  Heart S1S2 no rub Abdomen soft nontender nondistended Extremities no edema  Psych normal mood and affect Neuro alert and oriented; conversant and follows commands Access: RIJ tunn catheter   Test Results I personally reviewed new and old clinical labs and radiology tests Lab Results  Component Value Date   NA 138 08/10/2021   K 3.8 08/10/2021   CL 107 08/10/2021   CO2 25 08/10/2021   BUN 34 (H) 08/10/2021   CREATININE 4.50 (H) 08/10/2021   CALCIUM 8.3 (L) 08/10/2021   ALBUMIN 2.8 (L) 08/06/2021   PHOS 4.4 08/02/2021    Claudia Desanctis, MD 08/10/2021 7:53 AM

## 2021-08-10 NOTE — Progress Notes (Addendum)
Attempted to reach pt's dtr, Audrey Hill, but there was no answer and unable to leave a message. Will continue efforts to reach dtr to discuss out-pt HD arrangements.   Melven Sartorius Renal Navigator [phone]  Addendum at 3:37pm: Spoke to pt's dtr, Audrey Hill, via phone to discuss out-pt HD arrangements.

## 2021-08-10 NOTE — Progress Notes (Signed)
PT Evaluation   08/10/21 1647  PT Visit Information  Last PT Received On 08/10/21  Assistance Needed +1  PT/OT/SLP Co-Evaluation/Treatment Yes  Reason for Co-Treatment To address functional/ADL transfers  PT goals addressed during session Mobility/safety with mobility;Balance  History of Present Illness Ms. 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 s/p endoscopy on 11/5 and s/p R IJ HD catheter on 11/1. Pt started on HD during admission and plan to have AV fistula creation on 11/8.  Precautions  Precautions Fall  Restrictions  Weight Bearing Restrictions No  Home Living  Family/patient expects to be discharged to: Private residence  Living Arrangements Alone  Available Help at Discharge Family;Available PRN/intermittently  Type of Home Apartment  Home Access Stairs to enter  Entrance Stairs-Number of Steps 2-3  Entrance Stairs-Rails Right;Left;Can reach both  Home Layout One level  Bathroom Shower/Tub Tub/shower unit;Curtain  Bathroom Toilet Handicapped height  [place] (2 wheels);Cane - single point  Prior Function  Prior Level of Function  Independent/Modified Independent  Communication  Communication No difficulties  Pain Assessment  Pain Assessment Faces  Faces Pain Scale 8  Pain Location right leg (groin), lower back  Pain Descriptors / Indicators Aching  Pain Intervention(s) Limited activity within patient's tolerance;Monitored during session;Repositioned  Cognition  Arousal/Alertness Lethargic  Behavior During Therapy WFL for tasks assessed/performed  Overall Cognitive Status No family/caregiver present to determine baseline cognitive functioning   General Comments Keeping eyes closed at points during session. Likely fatigued from HD  Upper Extremity Assessment  Upper Extremity Assessment Defer to OT evaluation  Lower Extremity Assessment  Lower Extremity Assessment Generalized weakness  Cervical / Trunk Assessment  Cervical / Trunk Assessment Kyphotic  Bed Mobility  Overal bed mobility Modified Independent  Transfers  Overall transfer level Needs assistance  Equipment used 1 person hand held assist  Transfers Sit to/from Stand  Sit to Stand Min guard  General transfer comment Min guard for safety  Ambulation/Gait  Ambulation/Gait assistance Min guard  Gait Distance (Feet) 30 Feet  Assistive device 1 person hand held assist  Gait Pattern/deviations Step-through pattern;Decreased stride length  General Gait Details Min guard for safety. Cues to keep eyes open. Educated about using RW on HD days to improve stability  Gait velocity decreased  Balance  Overall balance assessment Mild deficits observed, not formally tested  PT - End of Session  Equipment Utilized During Treatment Gait belt  Activity Tolerance Patient limited by fatigue;Patient limited by lethargy  Patient left in bed;with call bell/phone within reach  Nurse Communication Mobility status;Patient requests pain meds  PT Assessment  PT Recommendation/Assessment Patient needs continued PT services  PT Visit Diagnosis Unsteadiness on feet (R26.81);Muscle weakness (generalized) (M62.81)  PT Problem List Decreased strength;Decreased activity tolerance;Decreased balance;Decreased mobility  Barriers to Discharge Decreased caregiver support  PT Plan  PT Frequency (ACUTE ONLY) Min 3X/week  PT Treatment/Interventions (ACUTE ONLY) DME instruction;Gait training;Stair training;Therapeutic activities;Functional mobility training;Therapeutic exercise;Balance training;Patient/family education  AM-PAC PT "6 Clicks" Mobility Outcome Measure (Version 2)  Help needed turning from  your back to your side while in a flat bed without using bedrails? 3  Help needed moving from lying on your back to sitting on the side of a flat bed  without using bedrails? 3  Help needed moving to and from a bed to a chair (including a wheelchair)? 3  Help needed standing up from a chair using your arms (e.g., wheelchair or bedside chair)? 3  Help needed to walk in hospital room? 3  Help needed climbing 3-5 steps with a railing?  3  6 Click Score 18  Consider Recommendation of Discharge To: Home with [facility]  Progressive Mobility  What is the highest level of mobility based on the progressive mobility assessment? Level 5 (Walks with assist in room/hall) - Balance while stepping forward/back and can walk in room with assist - Complete  Mobility Ambulated with assistance in room  PT Recommendation  Follow Up Recommendations Home health PT  Assistance recommended at discharge Intermittent Supervision/Assistance  Functional Status Assessment Patient has had a recent decline in their functional status and demonstrates the ability to make significant improvements in function in a reasonable and predictable amount of time.  PT equipment None recommended by PT  Individuals Consulted  Consulted and Agree with Results and Recommendations Patient  Acute Rehab PT Goals  Patient Stated Goal to go home  PT Goal Formulation With patient  Time For Goal Achievement 08/24/21  Potential to Achieve Goals Good  PT Time Calculation  PT Start Time (ACUTE ONLY) 1517  PT Stop Time (ACUTE ONLY) 1531  PT Time Calculation (min) (ACUTE ONLY) 14 min  PT General Charges  $$ ACUTE PT VISIT 1 Visit  PT Evaluation  $PT Eval Low Complexity 1 Low   Pt admitted secondary to problem above with deficits above. Pt requiring min guard and HHA for safety during mobility tasks. Pt very fatigued and required cues to keep eyes open. Educated about using RW at home to increase safety. Recommending HHPT to address current mobility  deficits. Will continue to follow acutely.   Reuel Derby, PT, DPT  Acute Rehabilitation Services  Pager: [phone] Office: [phone]

## 2021-08-11 ENCOUNTER — Inpatient Hospital Stay (HOSPITAL_COMMUNITY): Payer: Medicare HMO | Admitting: Anesthesiology

## 2021-08-11 ENCOUNTER — Encounter (HOSPITAL_COMMUNITY)
Admission: EM | Disposition: A | Discharge status: Home Health | Payer: Self-pay | Source: Home / Self Care | Attending: Internal Medicine

## 2021-08-11 HISTORY — PX: AV FISTULA PLACEMENT: SHX1204

## 2021-08-11 LAB — CBC
HCT: 23 % — ABNORMAL LOW (ref 36.0–46.0)
Hemoglobin: 7.2 g/dL — ABNORMAL LOW (ref 12.0–15.0)
MCH: 27.1 pg (ref 26.0–34.0)
MCHC: 31.3 g/dL (ref 30.0–36.0)
MCV: 86.5 fL (ref 80.0–100.0)
Platelets: 171 10*3/uL (ref 150–400)
RBC: 2.66 MIL/uL — ABNORMAL LOW (ref 3.87–5.11)
RDW: 13.2 % (ref 11.5–15.5)
WBC: 4.9 10*3/uL (ref 4.0–10.5)
nRBC: 0 % (ref 0.0–0.2)

## 2021-08-11 LAB — BASIC METABOLIC PANEL
Anion gap: 6 (ref 5–15)
BUN: 20 mg/dL (ref 8–23)
CO2: 28 mmol/L (ref 22–32)
Calcium: 8.3 mg/dL — ABNORMAL LOW (ref 8.9–10.3)
Chloride: 103 mmol/L (ref 98–111)
Creatinine, Ser: 3.28 mg/dL — ABNORMAL HIGH (ref 0.44–1.00)
GFR, Estimated: 15 mL/min — ABNORMAL LOW (ref >60)
Glucose, Bld: 109 mg/dL — ABNORMAL HIGH (ref 70–99)
Potassium: 3.8 mmol/L (ref 3.5–5.1)
Sodium: 137 mmol/L (ref 135–145)

## 2021-08-11 LAB — TROPONIN I (HIGH SENSITIVITY)
Troponin I (High Sensitivity): 149 ng/L (ref <18)
Troponin I (High Sensitivity): 115 ng/L (ref <18)

## 2021-08-11 LAB — PREPARE RBC (CROSSMATCH)

## 2021-08-11 SURGERY — ARTERIOVENOUS (AV) FISTULA CREATION
Anesthesia: General | Site: Arm Upper | Laterality: Left

## 2021-08-11 MED ORDER — HYDROMORPHONE HCL 2 MG PO TABS
1.0000 mg | ORAL_TABLET | Freq: Two times a day (BID) | ORAL | Status: DC | PRN
  Administered 2021-08-11 (×2): 1 mg via ORAL
  Filled 2021-08-11 (×2): qty 1

## 2021-08-11 MED ORDER — CHLORHEXIDINE GLUCONATE CLOTH 2 % EX PADS
6.0000 | MEDICATED_PAD | Freq: Every day | CUTANEOUS | Status: DC
  Administered 2021-08-11 – 2021-08-12 (×2): 6 via TOPICAL

## 2021-08-11 MED ORDER — OXYCODONE HCL 5 MG PO TABS
5.0000 mg | ORAL_TABLET | Freq: Once | ORAL | Status: AC | PRN
  Administered 2021-08-11: 5 mg via ORAL

## 2021-08-11 MED ORDER — OXYCODONE HCL 5 MG PO TABS
5.0000 mg | ORAL_TABLET | Freq: Once | ORAL | Status: AC
  Administered 2021-08-11: 5 mg via ORAL
  Filled 2021-08-11: qty 1

## 2021-08-11 MED ORDER — MORPHINE SULFATE (PF) 4 MG/ML IV SOLN
4.0000 mg | INTRAVENOUS | Status: DC | PRN
  Administered 2021-08-11 – 2021-08-12 (×6): 4 mg via INTRAVENOUS
  Filled 2021-08-11 (×7): qty 1

## 2021-08-11 MED ORDER — VASOPRESSIN 20 UNIT/ML IV SOLN
INTRAVENOUS | Status: AC
  Filled 2021-08-11: qty 1

## 2021-08-11 MED ORDER — EPHEDRINE SULFATE-NACL 50-0.9 MG/10ML-% IV SOSY
PREFILLED_SYRINGE | INTRAVENOUS | Status: DC | PRN
Start: 2021-08-11 — End: 2021-08-11
  Administered 2021-08-11: 10 mg via INTRAVENOUS
  Administered 2021-08-11: 5 mg via INTRAVENOUS
  Administered 2021-08-11: 15 mg via INTRAVENOUS
  Administered 2021-08-11 (×2): 10 mg via INTRAVENOUS

## 2021-08-11 MED ORDER — DEXAMETHASONE SODIUM PHOSPHATE 10 MG/ML IJ SOLN
INTRAMUSCULAR | Status: AC
  Filled 2021-08-11: qty 1

## 2021-08-11 MED ORDER — FENTANYL CITRATE (PF) 100 MCG/2ML IJ SOLN
INTRAMUSCULAR | Status: AC
  Filled 2021-08-11: qty 2

## 2021-08-11 MED ORDER — OXYCODONE HCL 5 MG PO TABS
ORAL_TABLET | ORAL | Status: AC
  Filled 2021-08-11: qty 1

## 2021-08-11 MED ORDER — 0.9 % SODIUM CHLORIDE (POUR BTL) OPTIME
TOPICAL | Status: DC | PRN
  Administered 2021-08-11: 1000 mL

## 2021-08-11 MED ORDER — DEXAMETHASONE SODIUM PHOSPHATE 10 MG/ML IJ SOLN
INTRAMUSCULAR | Status: DC | PRN
  Administered 2021-08-11: 10 mg via INTRAVENOUS

## 2021-08-11 MED ORDER — ONDANSETRON HCL 4 MG/2ML IJ SOLN
INTRAMUSCULAR | Status: DC | PRN
  Administered 2021-08-11: 4 mg via INTRAVENOUS

## 2021-08-11 MED ORDER — HEPARIN SODIUM (PORCINE) 1000 UNIT/ML IJ SOLN
INTRAMUSCULAR | Status: DC | PRN
  Administered 2021-08-11: 3000 [IU] via INTRAVENOUS

## 2021-08-11 MED ORDER — CEFAZOLIN SODIUM-DEXTROSE 2-3 GM-%(50ML) IV SOLR
INTRAVENOUS | Status: DC | PRN
  Administered 2021-08-11: 2 g via INTRAVENOUS

## 2021-08-11 MED ORDER — LIDOCAINE 2% (20 MG/ML) 5 ML SYRINGE
INTRAMUSCULAR | Status: AC
  Filled 2021-08-11: qty 5

## 2021-08-11 MED ORDER — PHENYLEPHRINE 40 MCG/ML (10ML) SYRINGE FOR IV PUSH (FOR BLOOD PRESSURE SUPPORT)
PREFILLED_SYRINGE | INTRAVENOUS | Status: DC | PRN
  Administered 2021-08-11 (×3): 160 ug via INTRAVENOUS

## 2021-08-11 MED ORDER — PROPOFOL 10 MG/ML IV BOLUS
INTRAVENOUS | Status: AC
  Filled 2021-08-11: qty 20

## 2021-08-11 MED ORDER — LIDOCAINE HCL (PF) 1 % IJ SOLN
INTRAMUSCULAR | Status: AC
  Filled 2021-08-11: qty 30

## 2021-08-11 MED ORDER — PHENYLEPHRINE HCL-NACL 20-0.9 MG/250ML-% IV SOLN
INTRAVENOUS | Status: DC | PRN
  Administered 2021-08-11: 25 ug/min via INTRAVENOUS

## 2021-08-11 MED ORDER — HEPARIN 6000 UNIT IRRIGATION SOLUTION
Status: AC
  Filled 2021-08-11: qty 500

## 2021-08-11 MED ORDER — MIDAZOLAM HCL 5 MG/5ML IJ SOLN
INTRAMUSCULAR | Status: DC | PRN
Start: 2021-08-11 — End: 2021-08-11
  Administered 2021-08-11: 2 mg via INTRAVENOUS

## 2021-08-11 MED ORDER — HEPARIN 6000 UNIT IRRIGATION SOLUTION
Status: DC | PRN
  Administered 2021-08-11: 1

## 2021-08-11 MED ORDER — LIDOCAINE-EPINEPHRINE (PF) 1 %-1:200000 IJ SOLN
INTRAMUSCULAR | Status: AC
  Filled 2021-08-11: qty 30

## 2021-08-11 MED ORDER — HEMOSTATIC AGENTS (NO CHARGE) OPTIME
TOPICAL | Status: DC | PRN
  Administered 2021-08-11: 1 via TOPICAL

## 2021-08-11 MED ORDER — FENTANYL CITRATE (PF) 100 MCG/2ML IJ SOLN
25.0000 ug | INTRAMUSCULAR | Status: DC | PRN
  Administered 2021-08-11: 50 ug via INTRAVENOUS

## 2021-08-11 MED ORDER — PAPAVERINE HCL 30 MG/ML IJ SOLN
INTRAMUSCULAR | Status: AC
  Filled 2021-08-11: qty 2

## 2021-08-11 MED ORDER — OXYCODONE HCL 5 MG/5ML PO SOLN: 5.0000 mg | Freq: Once | ORAL | Status: AC | PRN

## 2021-08-11 MED ORDER — ONDANSETRON HCL 4 MG/2ML IJ SOLN: 4.0000 mg | Freq: Four times a day (QID) | INTRAMUSCULAR | Status: DC | PRN

## 2021-08-11 MED ORDER — ONDANSETRON HCL 4 MG/2ML IJ SOLN
INTRAMUSCULAR | Status: AC
  Filled 2021-08-11: qty 2

## 2021-08-11 MED ORDER — FENTANYL CITRATE (PF) 250 MCG/5ML IJ SOLN
INTRAMUSCULAR | Status: AC
  Filled 2021-08-11: qty 5

## 2021-08-11 MED ORDER — ASPIRIN 325 MG PO TABS
325.0000 mg | ORAL_TABLET | Freq: Every day | ORAL | Status: DC
  Administered 2021-08-11 – 2021-08-12 (×2): 325 mg via ORAL
  Filled 2021-08-11 (×2): qty 1

## 2021-08-11 MED ORDER — SODIUM CHLORIDE 0.9 % IV SOLN: INTRAVENOUS | Status: DC | PRN

## 2021-08-11 MED ORDER — LIDOCAINE 2% (20 MG/ML) 5 ML SYRINGE
INTRAMUSCULAR | Status: DC | PRN
  Administered 2021-08-11: 60 mg via INTRAVENOUS

## 2021-08-11 MED ORDER — FENTANYL CITRATE (PF) 250 MCG/5ML IJ SOLN
INTRAMUSCULAR | Status: DC | PRN
  Administered 2021-08-11 (×2): 50 ug via INTRAVENOUS

## 2021-08-11 MED ORDER — SODIUM CHLORIDE 0.9% IV SOLUTION: Freq: Once | INTRAVENOUS | Status: AC

## 2021-08-11 MED ORDER — MIDAZOLAM HCL 2 MG/2ML IJ SOLN
INTRAMUSCULAR | Status: AC
  Filled 2021-08-11: qty 2

## 2021-08-11 MED ORDER — PROPOFOL 10 MG/ML IV BOLUS
INTRAVENOUS | Status: DC | PRN
  Administered 2021-08-11: 130 mg via INTRAVENOUS

## 2021-08-11 MED ORDER — EPHEDRINE 5 MG/ML INJ
INTRAVENOUS | Status: AC
  Filled 2021-08-11: qty 5

## 2021-08-11 SURGICAL SUPPLY — 43 items
ADH SKN CLS APL DERMABOND .7 (GAUZE/BANDAGES/DRESSINGS) ×1
AGENT HMST SPONGE THK3/8 (HEMOSTASIS)
ARMBAND PINK RESTRICT EXTREMIT (MISCELLANEOUS) ×4 IMPLANT
BAG COUNTER SPONGE SURGICOUNT (BAG) ×2 IMPLANT
BAG SPNG CNTER NS LX DISP (BAG) ×1
BLADE CLIPPER SURG (BLADE) ×2 IMPLANT
BNDG ELASTIC 4X5.8 VLCR STR LF (GAUZE/BANDAGES/DRESSINGS) ×1 IMPLANT
CANISTER SUCT 3000ML PPV (MISCELLANEOUS) ×2 IMPLANT
CATH EMB 2FR 60CM (CATHETERS) ×1 IMPLANT
CLIP VESOCCLUDE MED 6/CT (CLIP) ×1 IMPLANT
CLIP VESOCCLUDE SM WIDE 6/CT (CLIP) ×1 IMPLANT
COVER PROBE W GEL 5X96 (DRAPES) ×2 IMPLANT
DECANTER SPIKE VIAL GLASS SM (MISCELLANEOUS) ×2 IMPLANT
DERMABOND ADVANCED (GAUZE/BANDAGES/DRESSINGS) ×1
DERMABOND ADVANCED .7 DNX12 (GAUZE/BANDAGES/DRESSINGS) ×1 IMPLANT
ELECT REM PT RETURN 9FT ADLT (ELECTROSURGICAL) ×2
ELECTRODE REM PT RTRN 9FT ADLT (ELECTROSURGICAL) ×1 IMPLANT
GAUZE 4X4 16PLY ~~LOC~~+RFID DBL (SPONGE) ×1 IMPLANT
GLOVE SRG 8 PF TXTR STRL LF DI (GLOVE) ×2 IMPLANT
GLOVE SURG POLYISO LF SZ8 (GLOVE) IMPLANT
GLOVE SURG UNDER POLY LF SZ8 (GLOVE) ×4
GOWN STRL REUS W/ TWL LRG LVL3 (GOWN DISPOSABLE) ×2 IMPLANT
GOWN STRL REUS W/TWL 2XL LVL3 (GOWN DISPOSABLE) ×2 IMPLANT
GOWN STRL REUS W/TWL LRG LVL3 (GOWN DISPOSABLE) ×4
HEMOSTAT SNOW SURGICEL 2X4 (HEMOSTASIS) ×1 IMPLANT
HEMOSTAT SPONGE AVITENE ULTRA (HEMOSTASIS) IMPLANT
KIT BASIN OR (CUSTOM PROCEDURE TRAY) ×2 IMPLANT
KIT TURNOVER KIT B (KITS) ×2 IMPLANT
NS IRRIG 1000ML POUR BTL (IV SOLUTION) ×2 IMPLANT
PACK CV ACCESS (CUSTOM PROCEDURE TRAY) ×1 IMPLANT
PACK PERIPHERAL VASCULAR (CUSTOM PROCEDURE TRAY) ×1 IMPLANT
PAD ARMBOARD 7.5X6 YLW CONV (MISCELLANEOUS) ×4 IMPLANT
SPONGE T-LAP 18X18 ~~LOC~~+RFID (SPONGE) ×1 IMPLANT
SUT MNCRL AB 4-0 PS2 18 (SUTURE) ×2 IMPLANT
SUT PROLENE 6 0 BV (SUTURE) ×2 IMPLANT
SUT PROLENE 7 0 BV 1 (SUTURE) IMPLANT
SUT SILK 2 0 SH (SUTURE) ×2 IMPLANT
SUT VIC AB 3-0 SH 27 (SUTURE) ×2
SUT VIC AB 3-0 SH 27X BRD (SUTURE) ×1 IMPLANT
SYR 3ML LL SCALE MARK (SYRINGE) ×1 IMPLANT
TOWEL GREEN STERILE (TOWEL DISPOSABLE) ×2 IMPLANT
UNDERPAD 30X36 HEAVY ABSORB (UNDERPADS AND DIAPERS) ×2 IMPLANT
WATER STERILE IRR 1000ML POUR (IV SOLUTION) ×2 IMPLANT

## 2021-08-11 NOTE — Progress Notes (Signed)
At approximately 13:05 pt started to complain of chest pain, radiating to posterior left side of back. Pt was eating at this time. EKG obtained. MD paged and came to beside. Aspirin ordered and administered. Troponin ordered  Pt reports of ease in pain and resumed with eating lunch.   Will continue to monitor.

## 2021-08-11 NOTE — Anesthesia Procedure Notes (Signed)
Procedure Name: LMA Insertion Date/Time: 08/11/2021 7:44 AM Performed by: Lance Coon, CRNA Pre-anesthesia Checklist: Emergency Drugs available, Patient identified, Suction available, Patient being monitored and Timeout performed Patient Re-evaluated:Patient Re-evaluated prior to induction Oxygen Delivery Method: Circle system utilized Preoxygenation: Pre-oxygenation with 100% oxygen Induction Type: IV induction LMA: LMA inserted LMA Size: 3.0 Placement Confirmation: positive ETCO2 and breath sounds checked- equal and bilateral Tube secured with: Tape Dental Injury: Teeth and Oropharynx as per pre-operative assessment

## 2021-08-11 NOTE — Discharge Instructions (Signed)
[facility]  Discharge Instructions  AV Fistula or Graft Surgery for Dialysis Access  Please refer to the following instructions for your post-procedure care. Your surgeon or physician assistant will discuss any changes with you.  Activity  You may drive the day following your surgery, if you are comfortable and no longer taking prescription pain medication. Resume full activity as the soreness in your incision resolves.  Bathing/Showering  You may shower after you go home. Keep your incision dry for 48 hours. Do not soak in a bathtub, hot tub, or swim until the incision heals completely. You may not shower if you have a hemodialysis catheter.  Incision Care  Clean your incision with mild soap and water after 48 hours. Pat the area dry with a clean towel. You do not need a bandage unless otherwise instructed. Do not apply any ointments or creams to your incision. You may have skin glue on your incision. Do not peel it off. It will come off on its own in about one week. Your arm may swell a bit after surgery. To reduce swelling use pillows to elevate your arm so it is above your heart. Your doctor will tell you if you need to lightly wrap your arm with an ACE bandage.  Diet  Resume your normal diet. There are not special food restrictions following this procedure. In order to heal from your surgery, it is CRITICAL to get adequate nutrition. Your body requires vitamins, minerals, and protein. Vegetables are the best source of vitamins and minerals. Vegetables also provide the perfect balance of protein. Processed food has little nutritional value, so try to avoid this.  Medications  Resume taking all of your medications. If your incision is causing pain, you may take over-the counter pain relievers such as acetaminophen (Tylenol). If you were prescribed a stronger pain medication, please be aware these medications can cause nausea and constipation. Prevent  nausea by taking the medication with a snack or meal. Avoid constipation by drinking plenty of fluids and eating foods with high amount of fiber, such as fruits, vegetables, and grains.  Do not take Tylenol if you are taking prescription pain medications.  Follow up Your surgeon may want to see you in the office following your access surgery. If so, this will be arranged at the time of your surgery.  Please call us immediately for any of the following conditions:  Increased pain, redness, drainage (pus) from your incision site Fever of 101 degrees or higher Severe or worsening pain at your incision site Hand pain or numbness.  Reduce your risk of vascular disease:  Stop smoking. If you would like help, call [facility] at 1-800-QUIT-NOW 310-595-0533) or [place] at [place] your cholesterol Maintain a desired weight Control your diabetes Keep your blood pressure down  Dialysis  It will take several weeks to several months for your new dialysis access to be ready for use. Your surgeon will determine when it is okay to use it. Your nephrologist will continue to direct your dialysis. You can continue to use your Permcath until your new access is ready for use.   08/11/2021 Audrey Hill [id number] 1955/11/10  Surgeon(s): Broadus John, MD  Procedure(s): LEFT ARM BRACHIOCEPHALIC  ARTERIOVENOUS  FISTULA CREATION   May stick graft immediately   May stick graft on designated area only:   X Do not stick left AV fistula for 12 weeks    If you have any questions, please call  the office at [phone].

## 2021-08-11 NOTE — Progress Notes (Signed)
On call Dr. Curly Rim related to Pts. Low BP (108/57) and trending down. PT. Due for 50 mg hydralazine. Verbal order from Dr. Vinetta Bergamo to hold this medication. Will continue to monitor patient.

## 2021-08-11 NOTE — Anesthesia Preprocedure Evaluation (Signed)
Anesthesia Evaluation  Patient identified by MRN, date of birth, ID band Patient awake    Reviewed: Allergy & Precautions, H&P , NPO status , Patient's Chart, lab work & pertinent test results  Airway Mallampati: II   Neck ROM: full    Dental   Pulmonary asthma , former smoker,    breath sounds clear to auscultation       Cardiovascular hypertension, + angina + dysrhythmias Atrial Fibrillation  Rhythm:regular Rate:Normal     Neuro/Psych  Headaches, Seizures -,  PSYCHIATRIC DISORDERS Anxiety CVA    GI/Hepatic GERD  ,(+) Hepatitis -  Endo/Other    Renal/GU ESRF and DialysisRenal disease     Musculoskeletal  (+) Arthritis ,   Abdominal   Peds  Hematology  (+) Blood dyscrasia, anemia ,   Anesthesia Other Findings   Reproductive/Obstetrics                             Anesthesia Physical Anesthesia Plan  ASA: 3  Anesthesia Plan: General   Post-op Pain Management:    Induction: Intravenous  PONV Risk Score and Plan: 3 and Ondansetron, Dexamethasone, Treatment may vary due to age or medical condition and Midazolam  Airway Management Planned: LMA  Additional Equipment:   Intra-op Plan:   Post-operative Plan: Extubation in OR  Informed Consent: I have reviewed the patients History and Physical, chart, labs and discussed the procedure including the risks, benefits and alternatives for the proposed anesthesia with the patient or authorized representative who has indicated his/her understanding and acceptance.     Dental advisory given  Plan Discussed with: CRNA, Anesthesiologist and Surgeon  Anesthesia Plan Comments:         Anesthesia Quick Evaluation

## 2021-08-11 NOTE — H&P (Signed)
08/11/2021 7:17 AM Day of Surgery  History and physical note:  Patient seen and examined this morning prior to left-sided AV fistula placement Patient currently with end-stage renal disease being dialyzed through a right sided tunneled IJ catheter No complaints this morning  Vitals:   08/10/21 2157 08/11/21 0603  BP:  (!) 153/90  Pulse: 82 81  Resp:  20  Temp: 98.3 F (36.8 C) 98.4 F (36.9 C)  SpO2:    Palpable radial ulnar pulses bilaterally Antecubital fossa with multiple puncture marks from previous IVs, hematoma appreciated. Nonlabored breathing, regular rate  Plan: I had a long discussion with Audrey Hill regarding AV fistula placement.  She has a sizable cephalic vein at the antecubital fossa however there is concern there is thrombus in the mid arm.  I told her I would attempt native fistula by exposing this and performing a thrombectomy.  If this does not work, or if there is poor return flow, we will move forward with AV graft. After discussing the risk and benefits of left-sided AV fistula versus AV graft creation, Audrey Hill elected to proceed.   Audrey Hill

## 2021-08-11 NOTE — Progress Notes (Addendum)
HD#8 Subjective:  Overnight Events: NAEO   Initially saw patient when she was sleeping after dialysis and she was resting comfortably , however got a page about new chest pain around 1:30 and went to bedside to assess. Patient said she was eating lunch before she started to feel a sudden pain in her left side and her left arm. It really hurt around where she had the surgery this morning. By the time we got there to see her the pain had eased off some. She didn't think the pain felt like acid reflux and did not feel like it related to her eating. The pain in her back had been present ever since she started dialysis.  Objective:  Vital signs in last 24 hours: Vitals:   08/11/21 0930 08/11/21 0955 08/11/21 1040 08/11/21 1341  BP: 136/71 131/79 131/79 (!) 155/90  Pulse: 81 78 82   Resp: 18 17    Temp:  98.1 F (36.7 C)    TempSrc:  Oral    SpO2: 96% 98%    Weight:      Height:       Supplemental O2: Room Air SpO2: 98 % O2 Flow Rate (L/min): 2 L/min   Physical Exam:  Constitutional: Mildly cachectic elderly woman laying in bed. Uncomfortable but in  NAD.  Cardiovascular: RRR. No m/r/g. No LE edema. Pulmonary/Chest: Lungs CTAB. No wheezing or rales. Abdominal: Soft. ND/NT. Normal bowel sounds. MSK: Reproduceable muscle pain over her sternum, left flank, and mid back Neurological: Alert and oriented x3. No focal deficits. Skin: warm and dry, dressing over left arm C/D/I Psych: Appropriate mood and affect.  Filed Weights   08/10/21 0443 08/10/21 0858 08/10/21 1258  Weight: 62.9 kg 65 kg 63.6 kg     Intake/Output Summary (Last 24 hours) at 08/11/2021 1409 Last data filed at 08/11/2021 0910 Gross per 24 hour  Intake 700 ml  Output 20 ml  Net 680 ml   Net IO Since Admission: -3,475 mL [08/11/21 1409]  Pertinent Labs: CBC Latest Ref Rng & Units 08/11/2021 08/10/2021 08/08/2021  WBC 4.0 - 10.5 K/uL 4.9 4.5 -  Hemoglobin 12.0 - 15.0 g/dL 7.2(L) 7.4(L) 7.8(L)  Hematocrit 36.0  - 46.0 % 23.0(L) 23.6(L) 23.0(L)  Platelets 150 - 400 K/uL 171 177 -    CMP Latest Ref Rng & Units 08/11/2021 08/10/2021 08/08/2021  Glucose 70 - 99 mg/dL 109(H) 86 84  BUN 8 - 23 mg/dL 20 34(H) 19  Creatinine 0.44 - 1.00 mg/dL 3.28(H) 4.50(H) 2.70(H)  Sodium 135 - 145 mmol/L 137 138 137  Potassium 3.5 - 5.1 mmol/L 3.8 3.8 3.5  Chloride 98 - 111 mmol/L 103 107 101  CO2 22 - 32 mmol/L 28 25 -  Calcium 8.9 - 10.3 mg/dL 8.3(L) 8.3(L) -  Total Protein 6.5 - 8.1 g/dL - - -  Total Bilirubin 0.3 - 1.2 mg/dL - - -  Alkaline Phos 38 - 126 U/L - - -  AST 15 - 41 U/L - - -  ALT 0 - 44 U/L - - -    Imaging: No results found.  Assessment/Plan:   Active Problems:   Goals of care, counseling/discussion   Epigastric pain   Hypertensive urgency   Esophagitis   Malnutrition of moderate degree   Abdominal pain   Dysphagia   Patient Summary: Audrey Hill is a  65 y.o.  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  Nephrology still following. Kidney function remained stable. Next dialysis session on Tuesday. --Nephro following, appreciate assistance with HD --HD on Tuesday --AVF/AVG scheduled for Tuesday with VSS -- Benadryl 25 mg as needed for itching around [facility] site -- Avoid nephrotoxic agents  #Musculoskeletal Chest/Back Pain Patient complained of new chest and back pain that started suddenly while eating. Patient has nitro listed as an allergy as well as aspirin, though on chart review was able to identify that aspirin caused GI upset and was not a true allergy. Pain had improved significantly before any medication was given. EKG was obtained which showed some new T wave inversions in V4-6 but was otherwise unchanged.  Ordered troponins to rule out any potential cardiac etiology, though suspect patient's pain is more related to recent initiation of dialysis and being bedbound as well as possible awkward arm positioning in today's surgery. Patient's pain may have also related to anesthesia/analgesics from surgery wearing off. Patient spontaneously regained desire to eat and resumed her lunch. Esophageal dysmotility/spasm also potentially playing a role. Pain started before patient received blood products, do not think this is related to her pain.  -f/u troponins  #Gastritis She will follow-up with GI in the outpatient to evaluate for possible dysmotility disorder. Pain continues to improve, will decrease pain meds today and see how she does. --Continue Protonix 40 mg twice daily for 8 weeks --decrease Dilaudid 1 mg to BID --Continue Carafate 1g TID and qhs   #Hypertensive Urgency #Headache BP continues to be up-and-down.  SBP in the 150s to 160s. --Continue Coreg 12.5 mg twice daily --Continue hydralazine 50 mg q8hr --Continue losartan 50 mg daily   #Anemia No signs of active bleed, gastritis noted on EGD. Patient's hemoglobin slowly trended down, 7.2 today. Given patient had another procedure today ordered another unit of pRBCs as patient was likely going to be <7 after procedure.  - 1 unit pRBC on 11/8 --Aranesp 60 per nephro --Daily CBC --Transfuse <7   #CHF #Concern for amyloidosis She states her chest pain is improved since admission. BNP was 2,292 on 08/02/21. Echo showed EF is 30 to 35 %. LV cavity is dilated. LV shows a Grade III diastolic dysfunction (restrictive) with findings concerning for amyloidosis. Limited in evaluation with contrast due to ESRD, however as patient is now on HD can try and coordinate cardiac MRI with dialysis schedule. Placed a referral for patient to be seen as an outpatient. --Coreg 12.5 BID, losartan 25 --Cardiology referral at discharge --Strict I/O --Daily  weights --K>4, Mg >2   #Paroxysmal Atrial Fibrillation Stable. CHA2DS2-VASc Score = 4. Had a run of afib overnight but in NSR again today.  --Continue to hold Eliquis, restart on discharge  #Hx of CVA #Hyperlipidemia Stable. --Continue home statin --Hold eliquis as above   #Goals of care Patient with end-stage heart failure and end-stage renal disease. Patient has established with [facility] in the outpatient. Remains DNI. --[facility] Palliative team will follow up with patient after discharge.   #Tunneled Catheter Bleed; resolved   Diet: Advance as tolerated IVF: None,None VTE: None Code: DNI  Prior to Admission Living Arrangement: Home Anticipated Discharge: Home Barriers to Discharge: Scheduled permanent HD access Dispo: Anticipated discharge to Home today  [name], MD Internal Medicine Resident PGY-1 Pager [phone] Please contact the on call pager  after 5 pm and on weekends at [phone].

## 2021-08-11 NOTE — Progress Notes (Signed)
Nephrology Follow-Up note   Assessment/Recommendations: Audrey Hill is a/an 65 y.o. 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  She was hesitant to start dialysis but accepting now. She has had issues with compliance but because she remains fairly functional she is a suitable candidate for dialysis.  Appreciate TDC placement by IR and left brachiocephalic AVF placement on 11/8 -She will be TTS outpatient at [facility] - next tx will be Thursday per her TTS schedule    Epigastric Pain/Nausea and Vomiting: being treated for reflux per primary. Uremia likely playing some role in nausea. EGD with gastritis and nodular mucosa in the duodenum.    Hypertension: Blood pressure continues to be slightly high but overall improved.  Continue current medications.    Afib: meds per primary   Anemia of CKD: also blood loss possible. Maybe PUD with epigastric pain. Iron depleted.  Required transfusion on 11/2. Got ferrlecit on 11/1 aranesp 1mcg on 11/5 - increase next aranesp dose to 100 mcg if remains inpatient. PRBC's today to optimize - we discussed risks/benefits/indications for pRBC's and she does consent and has gotten earlier this hospitalization  Secondary Hyperparathyroidism: PTH 281. Calcium and phos acceptable.   Concern for amyloid: on Echo. W/u per primary  Dispo - per primary team.  From a renal standpoint ok for discharge today.  Ideally after a unit of PRBC's to optimize  ___________________________________________________________    Interval History/Subjective:  had 700 mL UOP over 11/7.  Last HD on 11/7 with 1 kg UF.  She had left brachiocephalic AVF placed this am with vascular.  She states was told would go home today or  tomorrow. Spoke with daughter at bedside  Review of systems:  Denies n/v Denies shortness of breath or chest pain   Medications:  Current Facility-Administered Medications  Medication Dose Route Frequency Provider Last Rate Last Admin   0.9 %  sodium chloride infusion   Intravenous PRN Baglia, Corrina, PA-C 10 mL/hr at 08/03/21 0139 New Bag at 08/03/21 0139   acetaminophen (TYLENOL) tablet 650 mg  650 mg Oral Q6H PRN Baglia, Corrina, PA-C       atorvastatin (LIPITOR) tablet 20 mg  20 mg Oral Daily Baglia, Corrina, PA-C   20 mg at 08/11/21 1040   benzonatate (TESSALON) capsule 100 mg  100 mg Oral TID PRN Baglia, Corrina, PA-C   100 mg at 08/04/21 1113   carvedilol (COREG) tablet 12.5 mg  12.5 mg Oral BID WC Baglia, Corrina, PA-C   12.5 mg at 08/11/21 1040   Chlorhexidine Gluconate Cloth 2 % PADS 6 each  6 each Topical Daily Angelica Pou, MD       [START ON 08/15/2021] Darbepoetin Alfa (ARANESP) injection 100 mcg  100 mcg Intravenous Q Sat-HD Baglia, Corrina, PA-C       diphenhydrAMINE (BENADRYL) capsule 25 mg  25 mg Oral Q6H PRN Baglia, Corrina, PA-C   25 mg at 08/09/21 1203   prochlorperazine (COMPAZINE) injection 10 mg  10 mg Intravenous Once Baglia, Corrina, PA-C       Followed by   diphenhydrAMINE (BENADRYL) injection 12.5 mg  12.5 mg Intravenous Once Baglia, Corrina, PA-C       fentaNYL (SUBLIMAZE) 100 MCG/2ML injection  hydrALAZINE (APRESOLINE) tablet 50 mg  50 mg Oral Q8H Baglia, Corrina, PA-C   50 mg at 08/11/21 8882   hydrocortisone cream 1 %   Topical PRN Karoline Caldwell, PA-C   1 application at 80/03/49 0656   HYDROmorphone (DILAUDID) tablet 1 mg  1 mg Oral BID PRN [place], Corrina, PA-C   1 mg at 08/11/21 1215   influenza vaccine adjuvanted (FLUAD) injection 0.5 mL  0.5 mL Intramuscular Tomorrow-1000 Angelica Pou, MD       losartan (COZAAR) tablet 50 mg  50 mg Oral Daily Baglia, Corrina, PA-C   50 mg at 08/11/21 1047   multivitamin (RENA-VIT) tablet 1  tablet  1 tablet Oral QHS Baglia, Corrina, PA-C   1 tablet at 08/10/21 2051   oxyCODONE (Oxy IR/ROXICODONE) 5 MG immediate release tablet            pantoprazole (PROTONIX) EC tablet 40 mg  40 mg Oral BID Baglia, Corrina, PA-C   40 mg at 08/11/21 1047   pneumococcal 23 valent vaccine (PNEUMOVAX-23) injection 0.5 mL  0.5 mL Intramuscular Tomorrow-1000 Angelica Pou, MD       polyethylene glycol (MIRALAX / Floria Raveling) packet 17 g  17 g Oral Daily PRN Baglia, Corrina, PA-C       ramelteon (ROZEREM) tablet 8 mg  8 mg Oral QHS PRN Baglia, Corrina, PA-C   8 mg at 08/10/21 2343   senna (SENOKOT) tablet 8.6 mg  1 tablet Oral Daily Baglia, Corrina, PA-C   8.6 mg at 08/10/21 1437   sucralfate (CARAFATE) 1 GM/10ML suspension 1 g  1 g Oral TID WC & HS Baglia, Corrina, PA-C   1 g at 08/11/21 1040   trimethobenzamide (TIGAN) capsule 300 mg  300 mg Oral Q8H PRN Karoline Caldwell, PA-C          Physical Exam: Vitals:   08/11/21 0955 08/11/21 1040  BP: 131/79 131/79  Pulse: 78 82  Resp: 17   Temp: 98.1 F (36.7 C)   SpO2: 98%    Total I/O In: 700 [I.V.:700] Out: 20 [Blood:20]  Intake/Output Summary (Last 24 hours) at 08/11/2021 1221 Last data filed at 08/11/2021 0910 Gross per 24 hour  Intake 700 ml  Output 1420 ml  Net -720 ml   Physical exam  General adult female in bed in no acute distress HEENT normocephalic atraumatic extraocular movements intact sclera anicteric Neck supple trachea midline Lungs clear to auscultation bilaterally normal work of breathing at rest  Heart S1S2 no rub Abdomen soft nontender nondistended Extremities no edema  Psych normal mood and affect Neuro alert and oriented; conversant and follows commands Access: RIJ tunn catheter. Left brachiocephalic AVF with bruit   Test Results I personally reviewed new and old clinical labs and radiology tests Lab Results  Component Value Date   NA 137 08/11/2021   K 3.8 08/11/2021   CL 103 08/11/2021   CO2 28  08/11/2021   BUN 20 08/11/2021   CREATININE 3.28 (H) 08/11/2021   CALCIUM 8.3 (L) 08/11/2021   ALBUMIN 2.8 (L) 08/06/2021   PHOS 4.4 08/02/2021    Claudia Desanctis, MD 08/11/2021 12:21 PM

## 2021-08-11 NOTE — Progress Notes (Signed)
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 s/p endoscopy on 11/5 and s/p R IJ HD catheter on 11/1. Pt started on HD during admission and plan to have AV fistula creation on 11/8. S/P fistula placement today.    PT Comments    Per RN, patient was reporting chest pain 10/10 a little while ago. Advises not to get patient oob at this time. Patient is agreeable to bed exercises and denies chest pain currently. She has fair tolerance with exercises, more difficulty on L LE due to knee discomfort. Patient will continue to benefit from skilled PT when medically appropriate to improve strength and independence.      Recommendations for follow up therapy are one component of a multi-disciplinary discharge planning process, led by the attending physician.  Recommendations may be updated based on patient status, additional functional criteria and insurance authorization.  Follow Up Recommendations  Home health PT     Assistance Recommended at Discharge Intermittent Supervision/Assistance  Equipment Recommendations  None recommended by PT    Recommendations for Other Services       Precautions / Restrictions Precautions Precautions: Fall Restrictions Weight Bearing Restrictions: No     Mobility  Bed Mobility               General bed mobility comments: not assessed per RN request    Transfers                        Ambulation/Gait                   Stairs             Wheelchair Mobility    Modified Rankin (Stroke Patients  Only)       Balance                                            Cognition Arousal/Alertness: Awake/alert Behavior During Therapy: WFL for tasks assessed/performed Overall Cognitive Status: Within Functional Limits for tasks assessed                                 General Comments: Awake, alert, eating lunch        Exercises Other Exercises Other Exercises: Supine BLE exercises: ap, heel slides, SLR, hip abd/add x 10 reps each    General Comments        Pertinent Vitals/Pain Pain Assessment: No/denies pain    Home Living                          Prior Function            PT Goals (current goals can now be found in the care plan section) Acute Rehab PT Goals Patient Stated Goal: to go home PT Goal Formulation: With patient Time For Goal Achievement: 08/24/21 Potential to Achieve Goals: Good Progress towards PT goals:  Progressing toward goals    Frequency    Min 3X/week      PT Plan Current plan remains appropriate    Co-evaluation              AM-PAC PT "6 Clicks" Mobility   Outcome Measure  Help needed turning from your back to your side while in a flat bed without using bedrails?: A Little Help needed moving from lying on your back to sitting on the side of a flat bed without using bedrails?: A Little Help needed moving to and from a bed to a chair (including a wheelchair)?: A Little Help needed standing up from a chair using your arms (e.g., wheelchair or bedside chair)?: A Little Help needed to walk in hospital room?: A Little Help needed climbing 3-5 steps with a railing? : A Little 6 Click Score: 18    End of Session   Activity Tolerance: Patient tolerated treatment well Patient left: in bed;with call bell/phone within reach Nurse Communication: Mobility status PT Visit Diagnosis: Unsteadiness on feet (R26.81);Muscle weakness (generalized) (M62.81)     Time: 8441-7127 PT Time Calculation  (min) (ACUTE ONLY): 10 min  Charges:  $Therapeutic Exercise: 8-22 mins                     Ansley Stanwood, PT, GCS 08/11/21,2:58 PM

## 2021-08-11 NOTE — Transfer of Care (Signed)
Immediate Anesthesia Transfer of Care Note  Patient: Audrey Hill  Procedure(s) Performed: LEFT ARM BRACHIOCEPHALIC  ARTERIOVENOUS  FISTULA CREATION (Left: Arm Upper)  Patient Location: PACU  Anesthesia Type:General  Level of Consciousness: drowsy and patient cooperative  Airway & Oxygen Therapy: Patient Spontanous Breathing  Post-op Assessment: Report given to RN and Post -op Vital signs reviewed and stable  Post vital signs: Reviewed and stable  Last Vitals:  Vitals Value Taken Time  BP 133/75 08/11/21 0915  Temp    Pulse 85 08/11/21 0916  Resp 21 08/11/21 0916  SpO2 96 % 08/11/21 0916  Vitals shown include unvalidated device data.  Last Pain:  Vitals:   08/11/21 0603  TempSrc: Oral  PainSc:       Patients Stated Pain Goal: 0 (55/83/16 7425)  Complications: No notable events documented.

## 2021-08-11 NOTE — Op Note (Addendum)
    NAME: Audrey Hill    MRN: [id number] DOB: 30-Aug-1956    DATE OF OPERATION: 08/11/2021  PREOP DIAGNOSIS:    End-stage renal disease  POSTOP DIAGNOSIS:    Same  PROCEDURE:    Left brachiocephalic fistula  SURGEON: Broadus John  ASSIST: Paul Half PA  ANESTHESIA: General  EBL: 10 mL  INDICATIONS:    Audrey Hill is a 65 y.o. 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 Right IJ TDC placed by DR. Peeples on 08/04/2021.  Bilateral upper extremity venous ultrasound demonstrated adequately-sized, left-sided cephalic vein for left brachiocephalic fistula.  At the time of ultrasound, there was thrombus appreciated in the cephalic vein due to previous IV.  I told her I would attempt native fistula by exposing this and performing a thrombectomy.  If this does not work, or if there is poor return flow, we will move forward with AV graft. After discussing the risk and benefits of left-sided AV fistula versus AV graft creation, Audrey Hill elected to proceed.  FINDINGS:   Sclerotic left cephalic vein, resolution of thrombotic burden on ultrasound.   TECHNIQUE:   The patient was brought to the operating room and placed in supine position. The left arm was prepped and draped in a standard fashion. IV antibiotics were prior to incision. A timeout was performed.  Only the cephalic vein was insonated using ultrasound.  This demonstrated resolution of the thrombotic burden appreciated several days ago.  Made decision to attempt brachiocephalic fistula, with alternative being AV graft.  A transverse incision was made above the elbow creese in the antecubital fossa. The  cephalic vein was isolated for 3 cm in length and ligated distally with a 2-0 silk stick-tie.  This was found to be sclerotic but of sufficient size.  The vein was dilated with  coronary dilators to 5 mm and flushed with heparin saline.  There was an area of stenosis due to the atherosclerotic disease, however this was relieved by the serial dilation.  The bicipital aponeurosis was partially released and the brachial artery freed from its paired brachial veins and secured with a vessel loop. The vein was juxtaposed to the brachial artery. The patient was heparinized. Vascular clamps were placed proximally and distally on the brachial artery and a 5 mm arteriotomy was created on the brachial artery. This was flushed with heparin saline.  An anastomosis was created in end to side fashion on the brachial artery using running 6-0 Prolene suture.  Prior to completing the anastomsis, the vessels were flushed and the suture line was tied down. There was a thrill in the cephalic vein from the anastomosis to the mid upper bicipital region. The patient had a multiphasic radial and ulnar signals,  with an excellent doppler signal in the fistula. The incision was irrigated and hemostasis achieved with cautery and suture. The deeper tissue was closed with 3-0 Vicryl and the skin closed with 4-0 Monocryl.  Dermabond was applied to the incisions. The patient was transferred to PACU in stable condition.   Given the complexity of the case a first assistant was necessary in order to expedient the procedure and safely perform the technical aspects of the operation.  Audrey Burows, MD [facility]  DATE OF DICTATION:   08/11/2021

## 2021-08-11 NOTE — Progress Notes (Addendum)
CSW spoke with Butch Penny with Medicaid transportation who confirmed CSW can now set up transportation for patient to and from HD. Patient will be picked up from home address to [place] time 6:00am for a 6:20am chair time Tuesday,Thursday, and Saturday. Patient will be picked up from HD at 11:00am  to return back to home address [place] Saturday.Butch Penny informed CSW that patient will need to reset up transportation on 04/03/2022. CSW spoke with patient at bedside. Patients daughter and Yolanda Bonine were also at bedside. CSW informed patient that HD transportation has been set up and informed patient of when to call back on 04/03/2022 to reset up transportation. Patient expressed she is currently going through bereavement. CSW offered patient psychiatry counseling resources. Patient accepted. No further questions reported at this time.

## 2021-08-12 ENCOUNTER — Encounter: Payer: Self-pay | Admitting: Internal Medicine

## 2021-08-12 ENCOUNTER — Encounter (HOSPITAL_COMMUNITY): Payer: Self-pay | Admitting: Vascular Surgery

## 2021-08-12 LAB — BPAM RBC
Blood Product Expiration Date: 202212032359
ISSUE DATE / TIME: 202211081548
Unit Type and Rh: 5100

## 2021-08-12 LAB — TYPE AND SCREEN
ABO/RH(D): O POS
Antibody Screen: NEGATIVE
Unit division: 0

## 2021-08-12 LAB — CBC
HCT: 26.9 % — ABNORMAL LOW (ref 36.0–46.0)
Hemoglobin: 8.6 g/dL — ABNORMAL LOW (ref 12.0–15.0)
MCH: 27.9 pg (ref 26.0–34.0)
MCHC: 32 g/dL (ref 30.0–36.0)
MCV: 87.3 fL (ref 80.0–100.0)
Platelets: 200 10*3/uL (ref 150–400)
RBC: 3.08 MIL/uL — ABNORMAL LOW (ref 3.87–5.11)
RDW: 13.5 % (ref 11.5–15.5)
WBC: 7.7 10*3/uL (ref 4.0–10.5)
nRBC: 0 % (ref 0.0–0.2)

## 2021-08-12 LAB — SURGICAL PATHOLOGY

## 2021-08-12 MED ORDER — HYDRALAZINE HCL 25 MG PO TABS
25.0000 mg | ORAL_TABLET | Freq: Three times a day (TID) | ORAL | Status: DC
  Administered 2021-08-12: 25 mg via ORAL
  Filled 2021-08-12: qty 1

## 2021-08-12 MED ORDER — RAMELTEON 8 MG PO TABS: 8.0000 mg | ORAL_TABLET | Freq: Every evening | ORAL | 1 refills | Status: DC | PRN

## 2021-08-12 MED ORDER — CHLORHEXIDINE GLUCONATE CLOTH 2 % EX PADS: 6.0000 | MEDICATED_PAD | Freq: Every day | CUTANEOUS | Status: DC

## 2021-08-12 MED ORDER — ATORVASTATIN CALCIUM 20 MG PO TABS: 20.0000 mg | ORAL_TABLET | Freq: Every day | ORAL | 3 refills | Status: DC

## 2021-08-12 MED ORDER — LIDOCAINE 5 % EX PTCH
1.0000 | MEDICATED_PATCH | CUTANEOUS | 0 refills | Status: DC
Start: 2021-08-13 — End: 2021-08-18

## 2021-08-12 MED ORDER — ACETAMINOPHEN 325 MG PO TABS
650.0000 mg | ORAL_TABLET | Freq: Four times a day (QID) | ORAL | 1 refills | Status: DC | PRN
Start: 2021-08-12 — End: 2022-08-01

## 2021-08-12 MED ORDER — APIXABAN 2.5 MG PO TABS: 2.5000 mg | ORAL_TABLET | Freq: Two times a day (BID) | ORAL | 2 refills | Status: DC

## 2021-08-12 MED ORDER — OXYCODONE-ACETAMINOPHEN 5-325 MG PO TABS: 1.0000 | ORAL_TABLET | ORAL | 0 refills | Status: AC | PRN

## 2021-08-12 MED ORDER — LIDOCAINE 5 % EX PTCH
1.0000 | MEDICATED_PATCH | CUTANEOUS | Status: DC
  Administered 2021-08-12: 1 via TRANSDERMAL
  Filled 2021-08-12: qty 1

## 2021-08-12 MED ORDER — CARVEDILOL 12.5 MG PO TABS: 12.5000 mg | ORAL_TABLET | Freq: Two times a day (BID) | ORAL | 3 refills | Status: DC

## 2021-08-12 MED ORDER — HYDRALAZINE HCL 25 MG PO TABS: 25.0000 mg | ORAL_TABLET | Freq: Three times a day (TID) | ORAL | 3 refills | Status: DC

## 2021-08-12 MED ORDER — LOSARTAN POTASSIUM 50 MG PO TABS: 50.0000 mg | ORAL_TABLET | Freq: Every day | ORAL | 3 refills | Status: DC

## 2021-08-12 MED ORDER — PANTOPRAZOLE SODIUM 40 MG PO TBEC: 40.0000 mg | DELAYED_RELEASE_TABLET | Freq: Two times a day (BID) | ORAL | 1 refills | Status: DC

## 2021-08-12 MED ORDER — DARBEPOETIN ALFA 100 MCG/0.5ML IJ SOSY: 100.0000 ug | PREFILLED_SYRINGE | INTRAMUSCULAR | 1 refills | Status: DC

## 2021-08-12 MED ORDER — RENA-VITE PO TABS: 1.0000 | ORAL_TABLET | Freq: Every day | ORAL | 1 refills | Status: DC

## 2021-08-12 NOTE — Progress Notes (Signed)
Mobility Specialist Progress Note    08/12/21 1621  Mobility  Activity Ambulated in hall  Level of Assistance Standby assist, set-up cues, supervision of patient - no hands on  Assistive Device Front wheel walker  Distance Ambulated (ft) 200 ft  Mobility Ambulated with assistance in hallway  Mobility Response Tolerated well  Mobility performed by Mobility specialist  $Mobility charge 1 Mobility   During Mobility: 82 HR  Pt received at sink and agreeable. Had a general sense of weakness on walk. Returned to sitting EOB with daughter and RN present.   Hildred Alamin Mobility Specialist  Mobility Specialist Phone: [phone]

## 2021-08-12 NOTE — Progress Notes (Addendum)
Progress Note    08/12/2021 8:25 AM 1 Day Post-Op  Subjective:  c/o left upper extremity pain form shoulder to hand   Vitals:   08/11/21 2009 08/12/21 0449  BP: (!) 108/57 (!) 141/78  Pulse: 76 82  Resp: 20 16  Temp: 97.6 F (36.4 C) 97.9 F (36.6 C)  SpO2: 96% 97%    Physical Exam: General appearance: awake, alert in NAD Chest: catheter site without bleeding or erythema. Dressing dry and intact. Respirations: unlabored; no dyspnea at rest; lungs CTAB Left upper extremity: Hand is warm with 5/5 grip strength. Motor function and sensation intact. Good bruit and thrill in fistula. 2+ radial pulse. Incision(s): Well approximated. No active bleeding or hematoma.    CBC    Component Value Date/Time   WBC 7.7 08/12/2021 0629   RBC 3.08 (L) 08/12/2021 0629   HGB 8.6 (L) 08/12/2021 0629   HGB 11.0 (L) 03/23/2013 0701   HCT 26.9 (L) 08/12/2021 0629   HCT 32.8 (L) 03/23/2013 0701   PLT 200 08/12/2021 0629   PLT 173 03/23/2013 0701   MCV 87.3 08/12/2021 0629   MCV 80 03/23/2013 0701   MCH 27.9 08/12/2021 0629   MCHC 32.0 08/12/2021 0629   RDW 13.5 08/12/2021 0629   RDW 15.6 (H) 03/23/2013 0701   LYMPHSABS 0.9 08/03/2021 0225   LYMPHSABS 1.3 03/23/2013 0701   MONOABS 0.3 08/03/2021 0225   MONOABS 0.5 03/23/2013 0701   EOSABS 0.1 08/03/2021 0225   EOSABS 0.1 03/23/2013 0701   BASOSABS 0.0 08/03/2021 0225   BASOSABS 0.0 03/23/2013 0701    BMET    Component Value Date/Time   NA 137 08/11/2021 0215   NA 143 03/26/2013 0416   K 3.8 08/11/2021 0215   K 3.1 (L) 03/26/2013 0416   CL 103 08/11/2021 0215   CL 106 03/26/2013 0416   CO2 28 08/11/2021 0215   CO2 32 03/26/2013 0416   GLUCOSE 109 (H) 08/11/2021 0215   GLUCOSE 148 (H) 03/26/2013 0416   BUN 20 08/11/2021 0215   BUN 7 03/26/2013 0416   CREATININE 3.28 (H) 08/11/2021 0215   CREATININE 1.07 03/26/2013 0416   CALCIUM 8.3 (L) 08/11/2021 0215   CALCIUM 8.8 03/26/2013 0416   GFRNONAA 15 (L) 08/11/2021 0215    GFRNONAA 57 (L) 03/26/2013 0416   GFRAA >60 11/03/2015 0840   GFRAA >60 03/26/2013 0416     Intake/Output Summary (Last 24 hours) at 08/12/2021 0825 Last data filed at 08/12/2021 0535 Gross per 24 hour  Intake 1255 ml  Output 820 ml  Net 435 ml    HOSPITAL MEDICATIONS Scheduled Meds:  aspirin  325 mg Oral Daily   atorvastatin  20 mg Oral Daily   carvedilol  12.5 mg Oral BID WC   Chlorhexidine Gluconate Cloth  6 each Topical Daily   [START ON 08/15/2021] darbepoetin (ARANESP) injection - DIALYSIS  100 mcg Intravenous Q Sat-HD   prochlorperazine  10 mg Intravenous Once   Followed by   diphenhydrAMINE  12.5 mg Intravenous Once   hydrALAZINE  50 mg Oral Q8H   influenza vaccine adjuvanted  0.5 mL Intramuscular Tomorrow-1000   losartan  50 mg Oral Daily   multivitamin  1 tablet Oral QHS   pantoprazole  40 mg Oral BID   pneumococcal 23 valent vaccine  0.5 mL Intramuscular Tomorrow-1000   senna  1 tablet Oral Daily   Continuous Infusions:  sodium chloride 10 mL/hr at 08/03/21 0139   PRN Meds:.sodium chloride, acetaminophen, benzonatate, diphenhydrAMINE,  hydrocortisone cream, HYDROmorphone, morphine injection, polyethylene glycol, ramelteon, trimethobenzamide  Assessment and Plan: ESRD on HD   POD 1 left braciocephalic AVF. No evidence of steal syndrome. Post-op pain. Follow-up in 4-6 weeks  VASCULAR STAFF ADDENDUM: I have independently interviewed and examined the patient. I agree with the above.    [name], MD [facility] Phone Number: [phone] 08/12/2021 2:50 PM    Risa Grill [facility] [phone] 08/12/2021  8:25 AM

## 2021-08-12 NOTE — Progress Notes (Signed)
Nephrology Follow-Up note   Assessment/Recommendations: Audrey Hill is a/an 65 y.o. 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  She was hesitant to start dialysis but accepting now. She has had issues with compliance but because she remains fairly functional she is a suitable candidate for dialysis.  Appreciate TDC placement by IR and left brachiocephalic AVF placement on 11/8 -She will be TTS outpatient at [facility] - next tx will be Thursday per her TTS schedule - if discharged she will go to outpatient unit - Not able to take sucralafate/carafate as a dialysis patient - please choose an alternative if needed   Epigastric Pain/Nausea and Vomiting: being treated for reflux per primary. Uremia likely playing some role in nausea. EGD with gastritis and nodular mucosa in the duodenum.   - Not able to take carafate as a dialysis patient - please choose an alternative if needed   Hypertension: note hydralazine dose held.  Reduced to 25 mg TID   Afib: meds per primary   Anemia of CKD: also blood loss possible. Maybe PUD with epigastric pain. Iron depleted.  Required transfusion on 11/2. Got ferrlecit on 11/1 aranesp 62mcg on 11/5 - increase next aranesp dose to 100 mcg if remains inpatient. S/p PRBC's on 11/8 with improvement in Hb  Secondary Hyperparathyroidism: PTH 281. Calcium and phos acceptable.   Concern for amyloid: on Echo. W/u per primary  Disposition - per primary team.    ___________________________________________________________    Interval History/Subjective:  had 800 mL UOP over 11/8.  Last HD on 11/7 with 1 kg UF.  She got a unit of PRBC's and was kept overnight; note troponin trended overnight and down on repeat  and in comparison to late October labs.  CP last night as below.  States she hasn't always said anything about some of her symptoms due to wanting to go home  Review of systems:   States nausea this am  Denies shortness of breath Reports chest discomfort last night and the past few days   Medications:  Current Facility-Administered Medications  Medication Dose Route Frequency Provider Last Rate Last Admin   0.9 %  sodium chloride infusion   Intravenous PRN Baglia, Corrina, PA-C 10 mL/hr at 08/03/21 0139 New Bag at 08/03/21 0139   acetaminophen (TYLENOL) tablet 650 mg  650 mg Oral Q6H PRN Baglia, Corrina, PA-C       aspirin tablet 325 mg  325 mg Oral Daily Demaio, Alexa, MD   325 mg at 08/12/21 0911   atorvastatin (LIPITOR) tablet 20 mg  20 mg Oral Daily Baglia, Corrina, PA-C   20 mg at 08/12/21 0911   benzonatate (TESSALON) capsule 100 mg  100 mg Oral TID PRN Baglia, Corrina, PA-C   100 mg at 08/04/21 1113   carvedilol (COREG) tablet 12.5 mg  12.5 mg Oral BID WC Baglia, Corrina, PA-C   12.5 mg at 08/12/21 0911   Chlorhexidine Gluconate Cloth 2 % PADS 6 each  6 each Topical Daily Angelica Pou, MD   6 each at 08/11/21 1249   [START ON 08/15/2021] Darbepoetin Alfa (ARANESP) injection 100 mcg  100 mcg Intravenous Q Sat-HD Baglia, Corrina, PA-C       diphenhydrAMINE (BENADRYL) capsule 25 mg  25  mg Oral Q6H PRN Baglia, Corrina, PA-C   25 mg at 08/09/21 1203   prochlorperazine (COMPAZINE) injection 10 mg  10 mg Intravenous Once Baglia, Corrina, PA-C       Followed by   diphenhydrAMINE (BENADRYL) injection 12.5 mg  12.5 mg Intravenous Once Baglia, Corrina, PA-C       hydrALAZINE (APRESOLINE) tablet 50 mg  50 mg Oral Q8H Baglia, Corrina, PA-C   50 mg at 08/12/21 0538   hydrocortisone cream 1 %   Topical PRN Baglia, Corrina, PA-C   1 application at 23/95/32 0656   HYDROmorphone (DILAUDID) tablet 1 mg  1 mg Oral BID PRN Baglia, Corrina, PA-C   1 mg at 08/11/21 1931   influenza vaccine  adjuvanted (FLUAD) injection 0.5 mL  0.5 mL Intramuscular Tomorrow-1000 Angelica Pou, MD       losartan (COZAAR) tablet 50 mg  50 mg Oral Daily Baglia, Corrina, PA-C   50 mg at 08/12/21 0912   morphine 4 MG/ML injection 4 mg  4 mg Intravenous Q2H PRN Angelia Mould, MD   4 mg at 08/12/21 0539   multivitamin (RENA-VIT) tablet 1 tablet  1 tablet Oral QHS Baglia, Corrina, PA-C   1 tablet at 08/11/21 2026   pantoprazole (PROTONIX) EC tablet 40 mg  40 mg Oral BID Baglia, Corrina, PA-C   40 mg at 08/12/21 0911   pneumococcal 23 valent vaccine (PNEUMOVAX-23) injection 0.5 mL  0.5 mL Intramuscular Tomorrow-1000 Angelica Pou, MD       polyethylene glycol (MIRALAX / Floria Raveling) packet 17 g  17 g Oral Daily PRN Baglia, Corrina, PA-C       ramelteon (ROZEREM) tablet 8 mg  8 mg Oral QHS PRN Baglia, Corrina, PA-C   8 mg at 08/12/21 0318   senna (SENOKOT) tablet 8.6 mg  1 tablet Oral Daily Baglia, Corrina, PA-C   8.6 mg at 08/10/21 1437   trimethobenzamide (TIGAN) capsule 300 mg  300 mg Oral Q8H PRN Karoline Caldwell, PA-C          Physical Exam: Vitals:   08/12/21 0449 08/12/21 0911  BP: (!) 141/78 132/72  Pulse: 82 82  Resp: 16   Temp: 97.9 F (36.6 C)   SpO2: 97%    No intake/output data recorded.  Intake/Output Summary (Last 24 hours) at 08/12/2021 0918 Last data filed at 08/12/2021 0535 Gross per 24 hour  Intake 555 ml  Output 800 ml  Net -245 ml   Physical exam  General adult female in bed in no acute distress HEENT normocephalic atraumatic extraocular movements intact sclera anicteric Neck supple trachea midline Lungs clear to auscultation bilaterally normal work of breathing at rest  Heart S1S2 no rub Abdomen soft nontender nondistended Extremities no edema  Psych normal mood and affect Neuro alert and oriented; conversant and follows commands Access: RIJ tunn catheter. Left brachiocephalic AVF with bruit and thrill    Test Results I personally reviewed new  and old clinical labs and radiology tests Lab Results  Component Value Date   NA 137 08/11/2021   K 3.8 08/11/2021   CL 103 08/11/2021   CO2 28 08/11/2021   BUN 20 08/11/2021   CREATININE 3.28 (H) 08/11/2021   CALCIUM 8.3 (L) 08/11/2021   ALBUMIN 2.8 (L) 08/06/2021   PHOS 4.4 08/02/2021    Claudia Desanctis, MD 08/12/2021 9:30 AM

## 2021-08-12 NOTE — Progress Notes (Signed)
[facility] KitchenprOccupational Therapy Treatment Patient Details Name: Audrey Hill MRN: [id number] DOB: 02-24-1956 Today's Date: 08/12/2021   History of present illness Ms. 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 s/p endoscopy on 11/5 and s/p R IJ HD catheter on 11/1. Pt started on HD during admission and plan to have AV fistula creation on 11/8. S/P fistula placement today.   OT comments  Patient continues to make steady progress towards goals in skilled OT session. Patient's session encompassed  full wash up ADL task at EOB. Patient able to complete with 3 seated rest breaks. Patient also able to complete 3 sit<>stand transfers at EOB at min guard level. Educated patient on energy conservation strategies in order to ensure safe discharge home. Recommendations remain appropriate, therapy will continue to follow while in house.    Recommendations for follow up therapy are one component of a multi-disciplinary discharge planning process, led by the attending physician.  Recommendations may be updated based on patient status, additional functional criteria and insurance authorization.    Follow Up Recommendations  Other (comment) (HHaide)    Assistance Recommended at Discharge    Equipment Recommendations       Recommendations for Other Services      Precautions / Restrictions Precautions Precautions: Fall Restrictions Weight Bearing Restrictions: No       Mobility Bed Mobility Overal bed mobility: Modified Independent             General bed mobility comments: sitting EOB upon arrival    Transfers Overall transfer level: Needs assistance   Transfers: Sit to/from Stand Sit to Stand: Min guard            General transfer comment: Min guard for safety     Balance   Sitting-balance support: No upper extremity supported Sitting balance-Leahy Scale: Good Sitting balance - Comments: able to complete balance in all planes to complete washing up task with no LOB     Standing balance-Leahy Scale: Fair Standing balance comment: one arm supported on bed for safety                           ADL either performed or assessed with clinical judgement   ADL Overall ADL's : Modified independent     Grooming: Supervision/safety;Standing;Wash/dry hands;Wash/dry face;Oral care;Applying deodorant   Upper Body Bathing: Supervision/ safety;Sitting   Lower Body Bathing: Supervison/ safety;Sit to/from stand   Upper Body Dressing : Supervision/safety;Sitting   Lower Body Dressing: Supervision/safety;Sit to/from stand               Functional mobility during ADLs: Supervision/safety General ADL Comments: patient with improved balance and activity tolerance in order to complete full bathing task sitting EOB    Extremity/Trunk Assessment              Vision       Perception     Praxis      Cognition Arousal/Alertness: Awake/alert Behavior During Therapy: WFL for tasks assessed/performed Overall Cognitive Status: Within Functional Limits for tasks assessed  Exercises     Shoulder Instructions       General Comments      Pertinent Vitals/ Pain       Pain Assessment: No/denies pain  Home Living                                          Prior Functioning/Environment              Frequency  Min 2X/week        Progress Toward Goals  OT Goals(current goals can now be found in the care plan section)  Progress towards OT goals: Progressing toward goals  Acute Rehab OT Goals Patient Stated Goal: to go home OT Goal Formulation: With patient Time For Goal Achievement:  08/24/21 Potential to Achieve Goals: Good  Plan Discharge plan remains appropriate    Co-evaluation                 AM-PAC OT "6 Clicks" Daily Activity     Outcome Measure   Help from another person eating meals?: None Help from another person taking care of personal grooming?: None Help from another person toileting, which includes using toliet, bedpan, or urinal?: A Little Help from another person bathing (including washing, rinsing, drying)?: None Help from another person to put on and taking off regular upper body clothing?: None Help from another person to put on and taking off regular lower body clothing?: None 6 Click Score: 23    End of Session    OT Visit Diagnosis: Unsteadiness on feet (R26.81);Muscle weakness (generalized) (M62.81);Pain   Activity Tolerance Patient tolerated treatment well   Patient Left in bed   Nurse Communication Mobility status        Time: 4403-4742 OT Time Calculation (min): 34 min  Charges: OT General Charges $OT Visit: 1 Visit OT Treatments $Self Care/Home Management : 23-37 mins  [place]. [place], [place] [facility] [place] 08/12/2021, 12:11 PM

## 2021-08-12 NOTE — Progress Notes (Addendum)
Pt to d/c to home today. Spoke to [place] at [facility] to make clinic aware pt to d/c today and start tomorrow. [facility] NP contacted regarding need for orders. Orders have been sent to clinic by provider.    Melven Sartorius Renal Navigator [phone]  Addendum at 2:18pm: Spoke with pt's dtr via phone. Dtr voices understanding of out-pt HD arrangements and aware pt needs to start at clinic tomorrow. Dtr plans to contact medicaid transport to confirm they plan to transport pt tomorrow am. Dtr aware of pt's d/c today.

## 2021-08-13 ENCOUNTER — Telehealth: Payer: Self-pay | Admitting: Nurse Practitioner

## 2021-08-13 NOTE — Telephone Encounter (Signed)
Transition of care contact from inpatient facility  Date of Discharge: 08/12/2021 Date of Contact: 08/13/2021 Method of contact: Phone  Attempted to contact patient to discuss transition of care from inpatient admission. Patient did not answer the phone. Message was left on the patient's voicemail with call back number [phone].

## 2021-08-15 NOTE — Anesthesia Postprocedure Evaluation (Signed)
Anesthesia Post Note  Patient: Audrey Hill  Procedure(s) Performed: LEFT ARM BRACHIOCEPHALIC  ARTERIOVENOUS  FISTULA CREATION (Left: Arm Upper)     Patient location during evaluation: PACU Anesthesia Type: General Level of consciousness: awake and alert Pain management: pain level controlled Vital Signs Assessment: post-procedure vital signs reviewed and stable Respiratory status: spontaneous breathing, nonlabored ventilation, respiratory function stable and patient connected to nasal cannula oxygen Cardiovascular status: blood pressure returned to baseline and stable Postop Assessment: no apparent nausea or vomiting Anesthetic complications: no   No notable events documented.  Last Vitals:  Vitals:   08/12/21 0911 08/12/21 1421  BP: 132/72 134/88  Pulse: 82   Resp:    Temp:    SpO2:      Last Pain:  Vitals:   08/12/21 1421  TempSrc:   PainSc: 0-No pain                 Yuma Blucher S

## 2021-08-18 ENCOUNTER — Encounter: Payer: Self-pay | Admitting: Internal Medicine

## 2021-08-18 ENCOUNTER — Ambulatory Visit (INDEPENDENT_AMBULATORY_CARE_PROVIDER_SITE_OTHER): Payer: Medicare HMO | Admitting: Internal Medicine

## 2021-08-18 ENCOUNTER — Other Ambulatory Visit: Payer: Self-pay

## 2021-08-18 VITALS — BP 118/67 | HR 71 | Temp 98.3°F | Ht 65.0 in | Wt 142.3 lb

## 2021-08-18 DIAGNOSIS — Z1329 Encounter for screening for other suspected endocrine disorder: Secondary | ICD-10-CM | POA: Diagnosis not present

## 2021-08-18 DIAGNOSIS — K209 Esophagitis, unspecified without bleeding: Secondary | ICD-10-CM

## 2021-08-18 DIAGNOSIS — D631 Anemia in chronic kidney disease: Secondary | ICD-10-CM

## 2021-08-18 DIAGNOSIS — D638 Anemia in other chronic diseases classified elsewhere: Secondary | ICD-10-CM | POA: Insufficient documentation

## 2021-08-18 DIAGNOSIS — K2100 Gastro-esophageal reflux disease with esophagitis, without bleeding: Secondary | ICD-10-CM

## 2021-08-18 DIAGNOSIS — I120 Hypertensive chronic kidney disease with stage 5 chronic kidney disease or end stage renal disease: Secondary | ICD-10-CM | POA: Diagnosis not present

## 2021-08-18 DIAGNOSIS — E785 Hyperlipidemia, unspecified: Secondary | ICD-10-CM

## 2021-08-18 DIAGNOSIS — Z0000 Encounter for general adult medical examination without abnormal findings: Secondary | ICD-10-CM

## 2021-08-18 DIAGNOSIS — N186 End stage renal disease: Secondary | ICD-10-CM | POA: Insufficient documentation

## 2021-08-18 DIAGNOSIS — Z992 Dependence on renal dialysis: Secondary | ICD-10-CM | POA: Insufficient documentation

## 2021-08-18 DIAGNOSIS — N185 Chronic kidney disease, stage 5: Secondary | ICD-10-CM

## 2021-08-18 DIAGNOSIS — I480 Paroxysmal atrial fibrillation: Secondary | ICD-10-CM | POA: Diagnosis not present

## 2021-08-18 DIAGNOSIS — M545 Low back pain, unspecified: Secondary | ICD-10-CM

## 2021-08-18 DIAGNOSIS — I10 Essential (primary) hypertension: Secondary | ICD-10-CM

## 2021-08-18 MED ORDER — PANTOPRAZOLE SODIUM 40 MG PO TBEC: 40.0000 mg | DELAYED_RELEASE_TABLET | Freq: Two times a day (BID) | ORAL | 1 refills | Status: DC

## 2021-08-18 MED ORDER — LIDOCAINE 5 % EX PTCH: 1.0000 | MEDICATED_PATCH | CUTANEOUS | 0 refills | Status: DC

## 2021-08-18 NOTE — Assessment & Plan Note (Addendum)
During hospital admission, esophogram with concern for esophageal dysmotility and mild narrowing. Patient had EGD with findings of gastritis. Recommended outpatient follow up for further work-up of dysmotility disorder, has an appointment scheduled for 12/8 with [facility]. D/c with instruction to take protonix 40 bid.   Hgb 8 on admission during hospitalization, and d/c Hgb 8.6. On Aranest with HD. F/u on surgical pathology without evidence of H Pylori. Pt denies CP, SHOB, hemoptysis, melena, and hematochezia.   -CBC today -Continue protonix 40 bid  -Plans to follow with [facility] on 09/10/21.

## 2021-08-18 NOTE — Assessment & Plan Note (Addendum)
Surgical pathology without evidence of H Pylori. D/c with instruction to take protonix 40 bid.   -Plans to follow with [facility] on 09/10/21.

## 2021-08-18 NOTE — Assessment & Plan Note (Signed)
Today's Vitals   08/18/21 1351 08/18/21 1354  BP: 118/67   Pulse: 71   Temp: 98.3 F (36.8 C)   TempSrc: Oral   SpO2: 100%   Weight: 142 lb 4.8 oz (64.5 kg)   Height: 5\' 5"  (1.651 m)   PainSc:  8    BP well controlled today. Reported home readings with SBP 120's. BP closer to goal on current regimen. Report good medication compliance.Tolerating medication without adverse effects. Counseled on the importance of daily exercise, low salt diet, and weight loss. Denies headaches, vision changes, lightheadedness, chest pain, SHOB, leg swelling or changes in speech. Exam benign and vitals otherwise wnl.    BMP done this visit  Continue current regimen with no medication changes today Encouraged to continue lifestyle modifications BP log and bring to next visit

## 2021-08-18 NOTE — Assessment & Plan Note (Addendum)
On TTS HD. Reports that she has been going to her HD sessions without any issues. Current access is right sided tunneled IJ catheter; plan to use L AV fistula in about ~6 weeks after she follows up with vasular surgery/fistula has healed. She states that the staff is friendly and she is continuing to adjust to the process. She reports feeling "not bad" after HD, and denies fatigue and lethargy afterwards.   Continue TTS HD via  tunneled IJ catheter Vascular surgery follow up in Dec

## 2021-08-18 NOTE — Assessment & Plan Note (Addendum)
Patient was in and out of A. fib with heart rate in the 60s to 90s throughout hospitalization. HR 71 today. Denies chest pain, palpitations, and SHOB. Reports excellent medication compliance of apixaban 2.5 bid without adverse affects.   Continue current regimen without any changes

## 2021-08-18 NOTE — Patient Instructions (Addendum)
Thank you, Ms.Angles Hans Eden for allowing [place] to provide your care today!  Today we discussed:  High blood pressure: continue taking carvedilol, hydralazine and losartan as prescribed  Cholesterol: continue atorvastatin  Pain: please start using the lidocaine patches   I will call you if any labs, tests, or imaging results require any further attention or action.   I have ordered the following labs for you:  Lab Orders         CBC no Diff         BMP8+Anion Gap      I have ordered the following  Start the following medications: Meds ordered this encounter  Medications   pantoprazole (PROTONIX) 40 MG tablet    Sig: Take 1 tablet (40 mg total) by mouth 2 (two) times daily.    Dispense:  60 tablet    Refill:  1   lidocaine (LIDODERM) 5 %    Sig: Place 1 patch onto the skin daily. Remove & Discard patch within 12 hours or as directed by MD    Dispense:  30 patch    Refill:  0     Follow up in: 1 month    Should you have any questions or concerns please call the internal medicine clinic at [phone].     Lajean Manes, MD  Internal Medicine Resident, PGY-1 [facility]

## 2021-08-18 NOTE — Assessment & Plan Note (Signed)
LDL 10/30 75. Excellent compliance to atorvastatin. No adverse effects noted.   Continue Atorvastatin 20 qd

## 2021-08-18 NOTE — Assessment & Plan Note (Addendum)
Patient came in with an initial Hgb of 8, thought to be related to AoCD  from ESRD. On 11/2 patient's hemoglobin dropped to 6.3 and she received 1 unit of pRBCs. Her hemoglobin responded appropriately to 7.6. Her hemoglobin slowly decreased and she received another unit on 11/8. Nephrology also oversaw the addition of aranest during dialysis; continues to receive aranest during HD.  Hemoglobin improved to 8.6 on day of discharge. CBC today. Denies hemoptysis, melena or hematochezia.   F/u on CBC   Addendum: Hgb stable at 8.5 since d/c 1 week ago.

## 2021-08-18 NOTE — Assessment & Plan Note (Addendum)
Pt offered mammogram and DEXA scan referals, deferring at this time as she has had so many life changes over the past 1 month. Stating she will think about for f/u visit.   -Re-evaluate interest during 1 mo f/u visit
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
## 2021-08-19 LAB — BMP8+ANION GAP
Anion Gap: 13 mmol/L (ref 10.0–18.0)
BUN/Creatinine Ratio: 9 — ABNORMAL LOW (ref 12–28)
BUN: 21 mg/dL (ref 8–27)
CO2: 30 mmol/L — ABNORMAL HIGH (ref 20–29)
Calcium: 7.9 mg/dL — ABNORMAL LOW (ref 8.7–10.3)
Chloride: 95 mmol/L — ABNORMAL LOW (ref 96–106)
Creatinine, Ser: 2.23 mg/dL — ABNORMAL HIGH (ref 0.57–1.00)
Glucose: 79 mg/dL (ref 70–99)
Potassium: 4.1 mmol/L (ref 3.5–5.2)
Sodium: 138 mmol/L (ref 134–144)
eGFR: 24 mL/min/{1.73_m2} — ABNORMAL LOW (ref >59)

## 2021-08-19 LAB — CBC
Hematocrit: 25.8 % — ABNORMAL LOW (ref 34.0–46.6)
Hemoglobin: 8.5 g/dL — ABNORMAL LOW (ref 11.1–15.9)
MCH: 27.4 pg (ref 26.6–33.0)
MCHC: 32.9 g/dL (ref 31.5–35.7)
MCV: 83 fL (ref 79–97)
Platelets: 191 10*3/uL (ref 150–450)
RBC: 3.1 x10E6/uL — ABNORMAL LOW (ref 3.77–5.28)
RDW: 13.5 % (ref 11.7–15.4)
WBC: 4.9 10*3/uL (ref 3.4–10.8)

## 2021-08-20 DIAGNOSIS — G8929 Other chronic pain: Secondary | ICD-10-CM | POA: Insufficient documentation

## 2021-08-20 DIAGNOSIS — M549 Dorsalgia, unspecified: Secondary | ICD-10-CM | POA: Insufficient documentation

## 2021-08-20 NOTE — Assessment & Plan Note (Signed)
Notes that she has ongoing left sided back pain. TTP which has been ongoing for ~1 week since starting HD in the hospital. She was also having left sided CP and arm pain during time of d/c (ACS was ruled out) and was prescribed percocet. CP has resolved however left sided back pain persists. States that she cannot take percocet because she cannot tollerate tylenol as it causes severe itchiness. Pain is constant and non-radiating. Aggravating factors include palpation and pressure (I.e when laying on that side during HD). Alleviating factors include pain medications. Counseled on conservative measures including thermotherapy and avoiding placing pressure on that side. Denies motor or sensory changes. Educated on medication limitations 2/2 ESRD. Advised pt to start using her lidocaine patches that were prescribe, pt is agreeable to try this.   Start using lidocaine patch  Thermotherapy  Avoid applying pressure to that area for continuous periods, such as during HD

## 2021-08-21 NOTE — Progress Notes (Signed)
Internal Medicine Clinic Attending  I saw and evaluated the patient.  I personally confirmed the key portions of the history and exam documented by Dr. Patel and I reviewed pertinent patient test results.  The assessment, diagnosis, and plan were formulated together and I agree with the documentation in the resident's note.  

## 2021-08-23 ENCOUNTER — Other Ambulatory Visit: Payer: Self-pay

## 2021-08-23 DIAGNOSIS — Z992 Dependence on renal dialysis: Secondary | ICD-10-CM

## 2021-08-23 DIAGNOSIS — N186 End stage renal disease: Secondary | ICD-10-CM

## 2021-08-26 ENCOUNTER — Telehealth: Payer: Self-pay | Admitting: *Deleted

## 2021-08-26 NOTE — Telephone Encounter (Signed)
Daughter notified of info below. She will take patient now to [facility].

## 2021-08-26 NOTE — Telephone Encounter (Signed)
Patient's daughter called in stating patient was given oxycodone to take for pain but she has an allergy to Tylenol. Patient has 3 day hx of hives on neck and arms. Denies SHOB, throat symptoms. Last dose of oxycodone was 2 days ago. Patient still c/o "severe pain" right side of back, left side of back, and right leg. Oxycodone is no longer on current med list but Tylenol is. No openings today in clinic. Please advise.

## 2021-08-26 NOTE — Telephone Encounter (Signed)
Tough situation. Oxycodone not on her med list, not a chronic medication, and somewhat risky given ESRD. I'm not sure what the pain generator is? Does this pain come from the recent fistula surgery, or something else? Would be best to evaluate this in a visit. Given office closing this afternoon, patient may have to wait until Monday to be seen or could use an urgent care for evaluation.

## 2021-08-31 ENCOUNTER — Telehealth: Payer: Self-pay

## 2021-08-31 ENCOUNTER — Telehealth: Payer: Self-pay | Admitting: Internal Medicine

## 2021-08-31 NOTE — Telephone Encounter (Signed)
Spoke with patient's daughter Shanon Payor and scheduled an in-person Palliative Consult for 09/23/21 @ 11:30 AM with Dr. Hollace Kinnier. Documentation will be noted in [place].   COVID screening was negative. No pets in home. Patient lives alone.   Consent obtained; updated Outlook/Netsmart/Team List and Epic.   Family is aware they may be receiving a call from provider the day before or day of to confirm appointment.

## 2021-08-31 NOTE — Telephone Encounter (Signed)
RTC to patient's daughter. Patient continues to have the pain in her left leg and leg is giving out.  Daughter stated that patient has had Morphine , Dilaudid, and Oxycodone. Daughter was informed that patient would need to be seen.  Daughter stated has transportation problems and patient is also on Dialysis.  Tues, Thursday, Saturday.  Daughter to bring patient in via transportation when scheduled for an appointment .  Call transferred  to front office for appointment. Appointment scheduled for Wednesday  09/02/2021 at [place], RN 08/31/2021  2:47 PM

## 2021-08-31 NOTE — Telephone Encounter (Signed)
Pt's daughter requesting a call back in reference to a pain medication being call in for the patient's back pain.

## 2021-09-01 ENCOUNTER — Telehealth: Payer: Self-pay

## 2021-09-01 NOTE — Telephone Encounter (Signed)
PA came through on cover my meds for pt ( LIDOCAINE PATCHES )  on 11/20  was done sent back received questions for DR to fill out and sent back was done and sent back 11/22 .Marland Kitchen    Decision : denied due to the Diagnosis of (Dorsalgia unspecified  ;acute left sided pain )  Did not meet the requirement of a medically - accepted indication

## 2021-09-02 ENCOUNTER — Encounter: Payer: Self-pay | Admitting: Internal Medicine

## 2021-09-02 ENCOUNTER — Ambulatory Visit (HOSPITAL_COMMUNITY)
Admission: RE | Admit: 2021-09-02 | Discharge: 2021-09-02 | Disposition: A | Discharge status: Home / Self Care | Payer: Medicare HMO | Source: Ambulatory Visit | Attending: Internal Medicine | Admitting: Internal Medicine

## 2021-09-02 ENCOUNTER — Ambulatory Visit (INDEPENDENT_AMBULATORY_CARE_PROVIDER_SITE_OTHER): Payer: Medicare HMO | Admitting: Internal Medicine

## 2021-09-02 ENCOUNTER — Other Ambulatory Visit: Payer: Self-pay

## 2021-09-02 ENCOUNTER — Telehealth: Payer: Self-pay

## 2021-09-02 VITALS — BP 139/80 | HR 79 | Temp 98.0°F | Ht 66.0 in | Wt 140.8 lb

## 2021-09-02 DIAGNOSIS — M545 Low back pain, unspecified: Secondary | ICD-10-CM | POA: Diagnosis present

## 2021-09-02 NOTE — Telephone Encounter (Signed)
PA for pt ( RAMELTEON ORAL TAB 8 MG ) came through on cover my meds  was done and sent back through [facility]  awaiting approval or denial

## 2021-09-02 NOTE — Patient Instructions (Signed)
Thank you, [name] for allowing [place] to provide your care today. Today we discussed your back pain.  1) I have placed orders for your x-rays to try to get to the bottom of your pain and numbness.   2) Lidocaine 4% patches for the back can be purchased over the counter to help with the pain.    I have ordered the following labs for you:  Lab Orders  No laboratory test(s) ordered today     Tests ordered today:  X-rays of the hip and lower back  Referrals ordered today:   Referral Orders  No referral(s) requested today     I have ordered the following medication/changed the following medications:   Stop the following medications: There are no discontinued medications.   Start the following medications: No orders of the defined types were placed in this encounter.    Follow up: 2-3 months   Should you have any questions or concerns please call the internal medicine clinic at [phone].
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
## 2021-09-02 NOTE — Assessment & Plan Note (Addendum)
Pt is here today for follow-up of her back pain. She continues to endorse left-sided lower back pain as well as right-sided low back pain radiating to the groin area. She cannot use Tylenol because she gets pruritic and develops a rash. She was not able to afford the 5% lidocaine patches she was given last time. She goes also complain of numbness in the LLE. The pain is worse with HD. She does not want to try PT with her HD schedule.  P: Obtain right hip and lumbar films today for further evaluation. Pt advised to purchase 4% lidocaine patches over the counter. Follow-up PRN. Otherwise, follow-up in 2-3 months for routine check-up.

## 2021-09-04 NOTE — Progress Notes (Signed)
Internal Medicine Clinic Attending  I saw and evaluated the patient.  I personally confirmed the key portions of the history and exam documented by Dr. Lorin Glass   and I reviewed pertinent patient test results.  The assessment, diagnosis, and plan were formulated together and I agree with the documentation in the resident's note. 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  If cleared by nephrology we could maybe consider NSAIDs given she is now on HD.  Patient was requesting oxycodone, I do not think opioids would be appropriate for this chronic pain furthermore she is very high risk with positive Utox on her last admission.

## 2021-09-07 ENCOUNTER — Telehealth: Payer: Self-pay | Admitting: Student

## 2021-09-07 ENCOUNTER — Telehealth: Payer: Self-pay

## 2021-09-07 DIAGNOSIS — M25551 Pain in right hip: Secondary | ICD-10-CM

## 2021-09-07 MED ORDER — MELOXICAM 7.5 MG PO TABS: 7.5000 mg | ORAL_TABLET | Freq: Every day | ORAL | 0 refills | Status: DC

## 2021-09-07 NOTE — Telephone Encounter (Signed)
I called and spoke to patient's daughter.  She stated that her mother still in a lot of pain which limited her ambulation. Pain mainly located in the right hip.  Lidocaine patch did not work.  Her daughter had to miss work to take care outpatient.  Right hip x-ray came back which showed mild degenerative changes of the right hip without evidence of fracture or dislocation.  I sympathized with her situation and but unfortunately there are not a lot of medications to use in the setting of her ESRD.  Cymbalta is not recommended due to ESRD.  Opioids is not appropriate for chronic pain.    Since patient is on HD, will start a 7 day course of meloxicam for pain.  Advised patient to rest and ice the joint as needed.  Daughter will call us back if pain does not improve.

## 2021-09-07 NOTE — Telephone Encounter (Signed)
Return call to pt's daughter, Shanon Payor, who stated pt is in a lot of pain; and OTC lidocaine patches are not helping. Requesting alternative pain med. Thanks

## 2021-09-07 NOTE — Telephone Encounter (Signed)
Pt daughter is requesting a call back she is requesting more Oxy for mom she stated that mom is hurting and she has to go to work so she needs her medication even though I explained that the medication stated ended  and she is no longer on the medicine

## 2021-09-10 ENCOUNTER — Ambulatory Visit (INDEPENDENT_AMBULATORY_CARE_PROVIDER_SITE_OTHER): Payer: Medicare HMO | Admitting: Internal Medicine

## 2021-09-10 ENCOUNTER — Encounter: Payer: Self-pay | Admitting: Internal Medicine

## 2021-09-10 DIAGNOSIS — R1013 Epigastric pain: Secondary | ICD-10-CM | POA: Diagnosis not present

## 2021-09-10 DIAGNOSIS — R1310 Dysphagia, unspecified: Secondary | ICD-10-CM | POA: Diagnosis not present

## 2021-09-10 MED ORDER — SUCRALFATE 1 GM/10ML PO SUSP: 1.0000 g | Freq: Four times a day (QID) | ORAL | 1 refills | Status: DC

## 2021-09-10 NOTE — Patient Instructions (Addendum)
If you are age 67 or older, your body mass index should be between 23-30. Your Body mass index is 23.67 kg/m. If this is out of the aforementioned range listed, please consider follow up with your Primary Care Provider. ________________________________________________________  The  GI providers would like to encourage you to use [facility] to communicate with providers for non-urgent requests or questions.  Due to long hold times on the telephone, sending your provider a message by [facility] may be a faster and more efficient way to get a response.  Please allow 48 business hours for a response.  Please remember that this is for non-urgent requests.  _______________________________________________________  Dennis Bast have been scheduled for a CTA of the abdomen and pelvis at [facility], 1st floor Radiology. You are scheduled on 09-18-21  at 5:30pm. You should arrive 15 minutes prior to your appointment time for registration.    Please follow the written instructions below on the day of your exam:   1) Do not eat anything after 1:30pm (4 hours prior to your test)   You may take any medications as prescribed with a small amount of water, if necessary. If you take any of the following medications: METFORMIN, GLUCOPHAGE, [place], AVANDAMET, RIOMET, FORTAMET, [place] MET, JANUMET, GLUMETZA or METAGLIP, you MAY be asked to HOLD this medication 48 hours AFTER the exam.   If you have any questions regarding your exam or if you need to reschedule, you may call [facility] at [phone] between the hours of 8:00 am and 5:00 pm, Monday-Friday.   Due to recent changes in healthcare laws, you may see the results of your imaging and laboratory studies on MyChart before your provider has had a chance to review them.  We understand that in some cases there may be results that are confusing or concerning to you. Not all laboratory results come back in the same time frame and the provider may be  waiting for multiple results in order to interpret others.  Please give [place] 48 hours in order for your provider to thoroughly review all the results before contacting the office for clarification of your results.   DISCONTINUE: Meloxicam  We have sent the following medications to your pharmacy for you to pick up at your convenience:  START: sucralfate 1gm 4 times daily.  You are scheduled to follow up in our office on 12-02-21 at 11:10am.  You have been scheduled for an Esophageal Manometry at [facility] on 12-16-21- at 10:30am am. Please arrive 30 minutes prior to your procedure for registration. You will need to go to outpatient registration (1st floor of the hospital) first. Make certain to bring your insurance cards as well as a complete list of medications.  Please remember the following:  1) Do not take any muscle relaxants, xanax (alprazolam) or ativan for 1 day prior to your test as well as the day of the test.  2) Nothing to eat or drink after 12:00 midnight on the night before your test.  3) Hold all diabetic medications/insulin the morning of the test. You may eat and take your medications after the test.  4) For 7 days prior to your test, do not take: Reglan, Tagamet, Zantac, Phenergan, Axid or Pepcid.  5) You MAY use an antacid such as Rolaids or Tums up to 12 hours prior to your test.  ------------------------------------------------------------------------------------------- ABOUT ESOPHAGEAL MANOMETRY Esophageal manometry (muh-NOM-uh-tree) is a test that gauges how well your esophagus works. Your esophagus is the long, muscular  tube that connects your throat to your stomach. Esophageal manometry measures the rhythmic muscle contractions (peristalsis) that occur in your esophagus when you swallow. Esophageal manometry also measures the coordination and force exerted by the muscles of your esophagus.   During esophageal manometry, a thin, flexible tube  (catheter) that contains sensors is passed through your nose, down your esophagus and into your stomach. Esophageal manometry can be helpful in diagnosing some mostly uncommon disorders that affect your esophagus.   Why it's done Esophageal manometry is used to evaluate the movement (motility) of food through the esophagus and into the stomach. The test measures how well the circular bands of muscle (sphincters) at the top and bottom of your esophagus open and close, as well as the pressure, strength and pattern of the wave of esophageal muscle contractions that moves food along.   What you can expect Esophageal manometry is an outpatient procedure done without sedation. Most people tolerate it well. You may be asked to change into a hospital gown before the test starts.   During esophageal manometry  While you are sitting up, a member of your health care team sprays your throat with a numbing medication or puts numbing gel in your nose or both.  A catheter is guided through your nose into your esophagus. The catheter may be sheathed in a water-filled sleeve. It doesn't interfere with your breathing. However, your eyes may water, and you may gag. You may have a slight nosebleed from irritation.  After the catheter is in place, you may be asked to lie on your back on an exam table, or you may be asked to remain seated.  You then swallow small sips of water. As you do, a computer connected to the catheter records the pressure, strength and pattern of your esophageal muscle contractions.  During the test, you'll be asked to breathe slowly and smoothly, remain as still as possible, and swallow only when you're asked to do so.  A member of your health care team may move the catheter down into your stomach while the catheter continues its measurements.  The catheter then is slowly withdrawn.  This test typically takes 30-45 minutes to complete.  It will take at least 2 weeks to receive the results of  this test from your physician.   Thank you for entrusting me with your care and choosing [facility].  Dr Lorenso Courier
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
## 2021-09-14 NOTE — Addendum Note (Signed)
Addended by: Stevan Born on: 09/14/2021 02:25 PM   Modules accepted: Orders

## 2021-09-16 ENCOUNTER — Other Ambulatory Visit: Payer: Self-pay

## 2021-09-16 ENCOUNTER — Encounter: Payer: Medicare HMO | Admitting: Student

## 2021-09-16 ENCOUNTER — Telehealth: Payer: Self-pay

## 2021-09-16 DIAGNOSIS — G8929 Other chronic pain: Secondary | ICD-10-CM

## 2021-09-16 DIAGNOSIS — R1013 Epigastric pain: Secondary | ICD-10-CM

## 2021-09-16 NOTE — Telephone Encounter (Signed)
I was not aware, thanks. She is scheduled to see me in clinic tmrw, will investigate further.

## 2021-09-16 NOTE — Telephone Encounter (Signed)
Received notification from Radiology that pt recent GFR 24, preventing pt from having CT A/P w/ contrast. Dr. Lorenso Courier was notified by radiology and expressed concern as pt is a dialysis pt. According to radiology, pt has not been going to radiology. Per Dr. Lorenso Courier, change CT A/P to mesenteric Doppler. Orders updated/changed to reflect. Message sent to Radiology scheduling for scheduling purposes.  Called pt to inquire further about why she may not be attending dialysis. States she has been having so much pain, although could not describe nor clearly identify location of her pain. Further added that her pain is so severe she cannot sit for long periods of time resulting in her need to get up frequently to ambulate. Pt then indicated she cannot ambulate d/t her pain but also will not sit. Asked if she had informed her PCP about her pain and about her not attending dialysis but could not provide me with a direct answer. Pt did state she has been referred to neurology to further evaluate her pain. Informed Dr. Lorenso Courier about this conversation. Will include PCP to ensure he/she is aware of pt choice to NOT attend dialysis.

## 2021-09-17 ENCOUNTER — Other Ambulatory Visit: Payer: Self-pay

## 2021-09-17 ENCOUNTER — Encounter (HOSPITAL_COMMUNITY): Payer: Self-pay

## 2021-09-17 ENCOUNTER — Ambulatory Visit (INDEPENDENT_AMBULATORY_CARE_PROVIDER_SITE_OTHER): Payer: Medicare HMO | Admitting: Internal Medicine

## 2021-09-17 ENCOUNTER — Emergency Department (HOSPITAL_COMMUNITY): Payer: Medicare HMO

## 2021-09-17 ENCOUNTER — Emergency Department (HOSPITAL_COMMUNITY)
Admission: EM | Admit: 2021-09-17 | Discharge: 2021-09-17 | Disposition: A | Discharge status: Home / Self Care | Payer: Medicare HMO | Attending: Emergency Medicine | Admitting: Emergency Medicine

## 2021-09-17 DIAGNOSIS — Z20822 Contact with and (suspected) exposure to covid-19: Secondary | ICD-10-CM | POA: Insufficient documentation

## 2021-09-17 DIAGNOSIS — N186 End stage renal disease: Secondary | ICD-10-CM | POA: Insufficient documentation

## 2021-09-17 DIAGNOSIS — G8929 Other chronic pain: Secondary | ICD-10-CM | POA: Diagnosis not present

## 2021-09-17 DIAGNOSIS — M545 Low back pain, unspecified: Secondary | ICD-10-CM | POA: Diagnosis not present

## 2021-09-17 DIAGNOSIS — I120 Hypertensive chronic kidney disease with stage 5 chronic kidney disease or end stage renal disease: Secondary | ICD-10-CM

## 2021-09-17 DIAGNOSIS — Z992 Dependence on renal dialysis: Secondary | ICD-10-CM

## 2021-09-17 DIAGNOSIS — R531 Weakness: Secondary | ICD-10-CM | POA: Insufficient documentation

## 2021-09-17 DIAGNOSIS — J45909 Unspecified asthma, uncomplicated: Secondary | ICD-10-CM | POA: Diagnosis not present

## 2021-09-17 DIAGNOSIS — Z87891 Personal history of nicotine dependence: Secondary | ICD-10-CM | POA: Insufficient documentation

## 2021-09-17 DIAGNOSIS — Z79899 Other long term (current) drug therapy: Secondary | ICD-10-CM | POA: Diagnosis not present

## 2021-09-17 DIAGNOSIS — I10 Essential (primary) hypertension: Secondary | ICD-10-CM

## 2021-09-17 DIAGNOSIS — Z96652 Presence of left artificial knee joint: Secondary | ICD-10-CM | POA: Insufficient documentation

## 2021-09-17 LAB — CBC WITH DIFFERENTIAL/PLATELET
Abs Immature Granulocytes: 0 10*3/uL (ref 0.00–0.07)
Basophils Absolute: 0 10*3/uL (ref 0.0–0.1)
Basophils Relative: 1 %
Eosinophils Absolute: 0.1 10*3/uL (ref 0.0–0.5)
Eosinophils Relative: 2 %
HCT: 34.8 % — ABNORMAL LOW (ref 36.0–46.0)
Hemoglobin: 10.6 g/dL — ABNORMAL LOW (ref 12.0–15.0)
Immature Granulocytes: 0 %
Lymphocytes Relative: 26 %
Lymphs Abs: 1.2 10*3/uL (ref 0.7–4.0)
MCH: 26 pg (ref 26.0–34.0)
MCHC: 30.5 g/dL (ref 30.0–36.0)
MCV: 85.3 fL (ref 80.0–100.0)
Monocytes Absolute: 0.5 10*3/uL (ref 0.1–1.0)
Monocytes Relative: 11 %
Neutro Abs: 2.7 10*3/uL (ref 1.7–7.7)
Neutrophils Relative %: 60 %
Platelets: 201 10*3/uL (ref 150–400)
RBC: 4.08 MIL/uL (ref 3.87–5.11)
RDW: 16.2 % — ABNORMAL HIGH (ref 11.5–15.5)
WBC: 4.6 10*3/uL (ref 4.0–10.5)
nRBC: 0 % (ref 0.0–0.2)

## 2021-09-17 LAB — COMPREHENSIVE METABOLIC PANEL
ALT: 16 U/L (ref 0–44)
AST: 25 U/L (ref 15–41)
Albumin: 3.5 g/dL (ref 3.5–5.0)
Alkaline Phosphatase: 61 U/L (ref 38–126)
Anion gap: 9 (ref 5–15)
BUN: 74 mg/dL — ABNORMAL HIGH (ref 8–23)
CO2: 21 mmol/L — ABNORMAL LOW (ref 22–32)
Calcium: 8.4 mg/dL — ABNORMAL LOW (ref 8.9–10.3)
Chloride: 112 mmol/L — ABNORMAL HIGH (ref 98–111)
Creatinine, Ser: 7.2 mg/dL — ABNORMAL HIGH (ref 0.44–1.00)
GFR, Estimated: 6 mL/min — ABNORMAL LOW (ref >60)
Glucose, Bld: 86 mg/dL (ref 70–99)
Potassium: 4.7 mmol/L (ref 3.5–5.1)
Sodium: 142 mmol/L (ref 135–145)
Total Bilirubin: 0.3 mg/dL (ref 0.3–1.2)
Total Protein: 7 g/dL (ref 6.5–8.1)

## 2021-09-17 LAB — I-STAT CHEM 8, ED
BUN: 73 mg/dL — ABNORMAL HIGH (ref 8–23)
Calcium, Ion: 1.11 mmol/L — ABNORMAL LOW (ref 1.15–1.40)
Chloride: 113 mmol/L — ABNORMAL HIGH (ref 98–111)
Creatinine, Ser: 7.3 mg/dL — ABNORMAL HIGH (ref 0.44–1.00)
Glucose, Bld: 81 mg/dL (ref 70–99)
HCT: 36 % (ref 36.0–46.0)
Hemoglobin: 12.2 g/dL (ref 12.0–15.0)
Potassium: 4.6 mmol/L (ref 3.5–5.1)
Sodium: 145 mmol/L (ref 135–145)
TCO2: 23 mmol/L (ref 22–32)

## 2021-09-17 LAB — RESP PANEL BY RT-PCR (FLU A&B, COVID) ARPGX2
Influenza A by PCR: NEGATIVE
Influenza B by PCR: NEGATIVE
SARS Coronavirus 2 by RT PCR: NEGATIVE

## 2021-09-17 LAB — MAGNESIUM: Magnesium: 2.4 mg/dL (ref 1.7–2.4)

## 2021-09-17 MED ORDER — LABETALOL HCL 5 MG/ML IV SOLN
10.0000 mg | Freq: Once | INTRAVENOUS | Status: AC
  Administered 2021-09-17: 10 mg via INTRAVENOUS
  Filled 2021-09-17: qty 4

## 2021-09-17 MED ORDER — HYDROMORPHONE HCL 2 MG PO TABS: 1.0000 mg | ORAL_TABLET | Freq: Three times a day (TID) | ORAL | 0 refills | Status: AC | PRN

## 2021-09-17 MED ORDER — HYDROMORPHONE HCL 2 MG PO TABS
1.0000 mg | ORAL_TABLET | Freq: Once | ORAL | Status: AC
  Administered 2021-09-17: 1 mg via ORAL
  Filled 2021-09-17: qty 1

## 2021-09-17 NOTE — ED Notes (Signed)
Pt is leaving w BP in the 160s. MD made aware

## 2021-09-17 NOTE — Telephone Encounter (Signed)
Called [facility] # [phone] to schedule [place] mesenteric arteries. Pt needs to be NPO after midnight. Pt scheduled as follows:  Appointment Information  Name: Audrey Hill, Audrey Hill MRN: [id number]  Date: 09/21/2021 Status: Sch  Time: 8:00 AM Length: 60  Visit Type: MESENTERIC [292446] Copay: $0.00  Provider: [facility] US 1 Department: [facility]  Referring Provider: Sharyn Creamer CSN: [id number]  Notes: Messenteric/abdominal pain/[place]/call back [phone]/Amy/dsc  Made On: 09/17/2021 3:02 PM By: Isaias Sakai   Called pt to make her aware. LVM requesting returned call.

## 2021-09-17 NOTE — ED Provider Notes (Signed)
Emergency Medicine Provider Triage Evaluation Note  CHELAN HERINGER , a 64 y.o. female  was evaluated in triage.  Pt complains of feeling poorly for 10 days.  She has had chronic pain.  She went last Tuesday and had a full session.   She has chronic pain in back and leg that has been why she missed dialysis per her report.   Review of Systems  Positive:  Negative: See above.   Physical Exam  BP (!) 186/101 (BP Location: Right Arm)    Pulse 95    Temp 99.6 F (37.6 C)    Resp 18    Ht 5\' 5"  (1.651 m)    Wt 67 kg    SpO2 99%    BMI 24.58 kg/m  Gen:   Awake, no distress   Resp:  Normal effort  MSK:   Moves extremities without difficulty  Other:  Normal speech.   Medical Decision Making  Medically screening exam initiated at 5:28 PM.  Appropriate orders placed.  JESSILYN CATINO was informed that the remainder of the evaluation will be completed by another provider, this initial triage assessment does not replace that evaluation, and the importance of remaining in the ED until their evaluation is complete.  Patient sent for concern of missing dialysis.    Her last dialysis session was Tuesday of last week. Will obtain labs, chest x-ray.  Note: Portions of this report may have been transcribed using voice recognition software. Every effort was made to ensure accuracy; however, inadvertent computerized transcription errors may be present    Ollen Gross 09/17/21 1732    Wyvonnia Dusky, MD 09/17/21 [phone]

## 2021-09-17 NOTE — Assessment & Plan Note (Addendum)
On TTS HD; crrent access is R sided tunneled IJ catheter with plan to use L AV fistula in about ~2 weeks after she follows up with vasular surgery/fistula has healed. Reporting that she has been missing HD 2/2 chronic lumbar and OA pain. She has missed 3 sessions and her HD site has advised her to go to ED for urgent HD. BP elevated 173/96; appears euvolemic on exam and LCTAB. Has left sided conjunctival hemorraghe 2/2 high blood pressure. Pt sent to ED for urgent HD and possible admission. ED charge nurse made aware.

## 2021-09-17 NOTE — ED Triage Notes (Signed)
Pt reports generalized body aches, fatigue, and weakness x 10 days. Reports she has missed dialysis for 10 days, last dialysis was Tuesday. Normally Tues/Thurs/Sat.

## 2021-09-17 NOTE — Patient Instructions (Signed)
Please go to the ED now for dialysis  I have prescribed you dilaudid for pain

## 2021-09-17 NOTE — Assessment & Plan Note (Addendum)
BP elevated 173/96. Regimen includes Coreg 12.5 bid, losartan 50, and hydralazine 25 tid; reports good med compliance. She has also missed 3 sessions of HD 2/2 chronic pain. Pt denies headaches and SHOB. Vitals otherwise stable. On exam, she is euvolemic and lungs CTAB. She has a left sided conjunctival hemorrhage from high BP. Pt sent to the ED for urgent HD.

## 2021-09-17 NOTE — Discharge Instructions (Addendum)
Please call your dialysis center early in the morning to schedule a make up session for dialysis.
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
## 2021-09-17 NOTE — Assessment & Plan Note (Addendum)
Continues to have left-sided lower back pain as well as right-sided low back pain that is not responsive to lidocaine patch. Imaging was order during prior visit, and lumbar XR shows DDD of lumbar spine; right hip XR pending. She was prescribed 7 days of meloxicam. She is ESRD on HD and nephrology recommending avoiding NSAIDs. She has tylenol allergy resulting in intensely pruritic rash. Opioids are inappropriate for her given positive utox during last admission~1 mo ago. She was previously offered PT referral but pt not agreeable to this.  Have exhausted options for managing this patients chronic, ongoing pain and would benefit from referral to pain clinic.   Pain clinic referral placed Dilaudid 1 mg q8h x 5 days until pt seen by pain clinic

## 2021-09-17 NOTE — Social Work (Signed)
CSW coordinated d/c transportation via [employer]

## 2021-09-18 ENCOUNTER — Ambulatory Visit (HOSPITAL_COMMUNITY): Admission: RE | Admit: 2021-09-18 | Payer: Medicare HMO | Source: Ambulatory Visit

## 2021-09-18 NOTE — Progress Notes (Signed)
Internal Medicine Clinic Attending  I saw and evaluated the patient.  I personally confirmed the key portions of the history and exam documented by Dr. Patel and I reviewed pertinent patient test results.  The assessment, diagnosis, and plan were formulated together and I agree with the documentation in the resident's note.  

## 2021-09-18 NOTE — Telephone Encounter (Signed)
SECOND ATTEMPT: ° °LVM requesting returned call. °

## 2021-09-21 ENCOUNTER — Ambulatory Visit (HOSPITAL_COMMUNITY): Payer: Medicare HMO

## 2021-09-22 NOTE — Telephone Encounter (Signed)
Same day canceled 12/19 d/t being sick. Rescheduled for 10/09/21.

## 2021-09-23 ENCOUNTER — Telehealth: Payer: Self-pay | Admitting: *Deleted

## 2021-09-23 ENCOUNTER — Ambulatory Visit (INDEPENDENT_AMBULATORY_CARE_PROVIDER_SITE_OTHER): Payer: Medicare HMO | Admitting: Student

## 2021-09-23 DIAGNOSIS — M25551 Pain in right hip: Secondary | ICD-10-CM | POA: Insufficient documentation

## 2021-09-23 NOTE — Assessment & Plan Note (Signed)
I called and spoke to patient's daughter.  She states that patient has been having right hip pain after dialysis about 2 months ago.  Say the patient could barely walk or bear weight on it.  She denies any tingling or numbness sensation of right leg.  She denies any trauma or fall.  She said that lidocaine and meloxicam provides minimal relief.  Patient is allergic to Tylenol, reports symptom of severe hives.  Said the Dilaudid provide better relief.  Recent right hip x-ray showed mild degenerative change without any fracture or dislocation.  Assessment and plan Patient has been having issues with pain control for a long time.  Nephrology recommends avoiding NSAIDs due to her history of gastritis.  She is allergic to Tylenol.  Opioids was avoided due to high risk (multiple positive UDS).  Referral to pain clinic was made at last visit.  -Will obtain an right hip MRI to rule out avascular necrosis -Advised patient to not put weight on the right hip.  She can obtain a wheelchair for mobility at this time while waiting for the MRI. -Continue conservative treatment with ice and topical medication. -Would not prescribe additional opioid -Follow-up with pain clinic
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
## 2021-09-23 NOTE — Telephone Encounter (Signed)
Patient's daughter called in stating patient fell yesterday after right leg gave out. States she's having a lot of right hip pain. 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

## 2021-09-24 ENCOUNTER — Ambulatory Visit: Payer: Medicare HMO | Admitting: Psychologist

## 2021-09-25 ENCOUNTER — Telehealth: Payer: Self-pay | Admitting: Student

## 2021-09-25 ENCOUNTER — Encounter (HOSPITAL_COMMUNITY): Payer: Medicare HMO

## 2021-09-25 NOTE — Telephone Encounter (Signed)
Call from patient's daughter concerning right hip pain.  States that of right hip pain and would like more medications for this.  States that he has been trying to manage pain with conservative measures including pain patches, offloading which has not improved the pain.  Daughter requesting hydrocodone for pain which she got previously in the ED.  I reviewed prior clinic documentation from 12/21 from Dr. Alfonse Spruce.  Discussed that that would not be an appropriate medication given that we do not know the cause of her right hip pain.  Discussed that we would continue to recommend conservative measures given her allergy to Tylenol and ESRD.  Daughter was quite upset that there was not more we could do for her pain.  Discussed that we could try a short course of meloxicam to help control her symptoms until she is able to get her MRI to further characterize her hip pain.  Daughter states that she was taken off of this and that it did not previously help with her hip pain.  Discussed that unfortunately until she had her MRI I would not be comfortable prescribing her pain medication for this.  Daughter states she will take her mother to the ED if her pain worsens and subsequently disconnected the call.

## 2021-10-01 NOTE — Progress Notes (Signed)
Internal Medicine Clinic Attending  Case discussed with Dr. Nguyen  At the time of the visit.  We reviewed the resident's history and exam and pertinent patient test results.  I agree with the assessment, diagnosis, and plan of care documented in the resident's note. 

## 2021-10-09 ENCOUNTER — Ambulatory Visit (HOSPITAL_COMMUNITY): Admission: RE | Admit: 2021-10-09 | Payer: Medicare HMO | Source: Ambulatory Visit

## 2021-10-09 ENCOUNTER — Encounter (HOSPITAL_COMMUNITY): Payer: Medicare HMO

## 2021-10-12 ENCOUNTER — Other Ambulatory Visit: Payer: Self-pay

## 2021-10-12 NOTE — Telephone Encounter (Signed)
Pt Ns'd for appt 1/6. Given pt scheduling history, routing this message to Dr. Lorenso Courier for continuity of care purposes.

## 2021-10-13 MED ORDER — APIXABAN 2.5 MG PO TABS: 2.5000 mg | ORAL_TABLET | Freq: Two times a day (BID) | ORAL | 2 refills | Status: DC

## 2021-10-16 ENCOUNTER — Encounter (HOSPITAL_COMMUNITY): Payer: Medicare HMO

## 2021-10-21 ENCOUNTER — Ambulatory Visit (HOSPITAL_COMMUNITY): Admission: RE | Admit: 2021-10-21 | Payer: Medicare HMO | Source: Ambulatory Visit

## 2021-11-19 ENCOUNTER — Ambulatory Visit (HOSPITAL_COMMUNITY): Admission: RE | Admit: 2021-11-19 | Payer: Medicare HMO | Source: Ambulatory Visit

## 2021-11-20 ENCOUNTER — Telehealth: Payer: Self-pay | Admitting: *Deleted

## 2021-11-20 ENCOUNTER — Encounter: Payer: Self-pay | Admitting: Student

## 2021-11-20 ENCOUNTER — Ambulatory Visit (INDEPENDENT_AMBULATORY_CARE_PROVIDER_SITE_OTHER): Payer: Medicare HMO | Admitting: Student

## 2021-11-20 VITALS — BP 130/75 | HR 71 | Temp 98.3°F | Wt 155.4 lb

## 2021-11-20 DIAGNOSIS — N186 End stage renal disease: Secondary | ICD-10-CM | POA: Diagnosis not present

## 2021-11-20 DIAGNOSIS — M25551 Pain in right hip: Secondary | ICD-10-CM | POA: Diagnosis not present

## 2021-11-20 DIAGNOSIS — G4700 Insomnia, unspecified: Secondary | ICD-10-CM

## 2021-11-20 DIAGNOSIS — F5101 Primary insomnia: Secondary | ICD-10-CM | POA: Diagnosis not present

## 2021-11-20 DIAGNOSIS — R0781 Pleurodynia: Secondary | ICD-10-CM | POA: Insufficient documentation

## 2021-11-20 DIAGNOSIS — I120 Hypertensive chronic kidney disease with stage 5 chronic kidney disease or end stage renal disease: Secondary | ICD-10-CM

## 2021-11-20 DIAGNOSIS — I10 Essential (primary) hypertension: Secondary | ICD-10-CM

## 2021-11-20 DIAGNOSIS — Z992 Dependence on renal dialysis: Secondary | ICD-10-CM

## 2021-11-20 DIAGNOSIS — I480 Paroxysmal atrial fibrillation: Secondary | ICD-10-CM

## 2021-11-20 MED ORDER — RAMELTEON 8 MG PO TABS: 8.0000 mg | ORAL_TABLET | Freq: Every evening | ORAL | 1 refills | Status: DC | PRN

## 2021-11-20 NOTE — Assessment & Plan Note (Addendum)
Patient with history of ESRD on HD started a year ago.  She has a right tunneled HD catheter and left upper extremity fistula.  Currently doing HD through her catheter.  She was hospitalized this month due to volume overload from missing dialysis.  BNP elevated at 2600.  Chest x-ray showed pulmonary edema.    She states that she has been through a lot at home and her brother just passed away.  In the hospital, she underwent volume removal with HD and started her back on regular schedule Tuesday, Thursday and Saturday.  Patient states that she following her schedule consistently.  Said that her mood has improved and she is doing better.  PHQ-9 score only 5 today.  She states that she does not need medication for her mood at this time.  -Encourage patient to follow her HD as scheduled
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
## 2021-11-20 NOTE — Assessment & Plan Note (Addendum)
Patient report pain of bilateral lower ribs radiates to her back that worse with deep breathing and movement.  Said that this problem started before hospitalization and persist.  States that HD also makes the pain worse.  She denies chest pain or shortness of breath.  Patient was treated with 5 days of ceftriaxone azithromycin for community-acquired pneumonia.  For work-up of her chest pain, EKG did not show any ischemia.  Troponin elevated but flat.  Chest x-ray showed pulmonary edema.  TTE showed normal EF with dilated IVC and increased pulm hypertension.  Patient does not appear volume overloaded on exam.  Her lungs are clear.  She has no JVD or lower extremity edema.  Her weight is down from last discharge.  She does have pain to palpation of bilateral lower ribs and lower back.  Her pleuritic chest pain could be secondary to inflammation from her community-acquired pneumonia.  Could also be a component of MSK.  Advised patient that symptoms will slowly improving with time.

## 2021-11-20 NOTE — Telephone Encounter (Signed)
Call from patient about prescription for Rozerem being sent to pharmacy. Call to Pharmacy prescription was sent.  Requires PA..  Will forward for PA

## 2021-11-20 NOTE — Patient Instructions (Addendum)
Ms. Hilbun,  It was a pleasure seeing you in the clinic today.  Here is a summary what we talked about:  1.  Your pain with breathing is likely due to your pneumonia and fluid in your lungs.  This symptom will slowly getting better with time.  2.  Right hip pain: I will reorder an MRI of your hip  3.  I will order a sleep study to rule out sleep apnea  4.  Please continue to follow-up with your dialysis schedule.  It is important you do not miss any section.  5.  I will work on a pain clinic referral for you.  Please return in 3 months  Take care,  Dr. Alfonse Spruce

## 2021-11-20 NOTE — Assessment & Plan Note (Signed)
Patient endorses difficulty sleeping which contributes to her depressed mood.  She report issue with staying asleep but no problem with falling to sleep.  Reports wake up multiple times at night.  She is unclear if she snores or have issues with breathing during sleep.  Report fatigue and sleepy after waking up in during the daytime.  Her STOP-BANG score was 3 indicating intermediate risk for OSA.  Her echocardiogram did showed increased pulmonary hypertension which increase her probability of having OSA.  -Order sleep study -Refill ramelteon

## 2021-11-20 NOTE — Assessment & Plan Note (Addendum)
Patient has chronic right hip pain.  Pain located in the deep groin area.  We did order a MRI of the right hip to rule out avascular necrosis but patient was not aware of her schedule yesterday.  She endorses persistent pain.  Physical exam and revealed deep groin pain with leg roll test.  She has normal range of motion but pain is a limiting factor. Will reorder MRI to rule out avascular necrosis  Regarding her chronic pain, we have placed a referral to pain clinic.  With her history of cocaine use and new ESRD, we are not comfortable prescribing narcotics.  She did that she follow Dr. Angie Fava at [facility] pain [facility] in the past and has not seen him for about 2 years.  She is not willing to come back.  We will proceed with a new referral..

## 2021-11-20 NOTE — Assessment & Plan Note (Signed)
Blood pressure well controlled -Continue Coreg, hydralazine and losartan

## 2021-11-23 ENCOUNTER — Telehealth: Payer: Self-pay

## 2021-11-23 NOTE — Progress Notes (Signed)
Internal Medicine Clinic Attending  Case discussed with Dr. Nguyen  At the time of the visit.  We reviewed the resident's history and exam and pertinent patient test results.  I agree with the assessment, diagnosis, and plan of care documented in the resident's note. 

## 2021-11-23 NOTE — Telephone Encounter (Signed)
Alternatives include :  BELSOMRA TAB    DOXEPIN HCL TAB  TEMAZEPAM  CAP   TRIAZOLAM  0.125MG  TAB    ZALEPLON CAP   ZOLPIDEM TARTRATE TAB

## 2021-11-23 NOTE — Telephone Encounter (Signed)
DECISION:    DENIED       Insurance will be sending a copy of approved/ preferred medicine that pt needs to try before medication is approved .Marland Kitchen

## 2021-11-23 NOTE — Telephone Encounter (Signed)
Pa for pt ( RAMELTEON 8 MG TAB ) came through again was done and submitted on cover my meds this time / with office notes ... awaiting approval or denial..    UPDATE :   Your information has been submitted to [place] [employer] Part D. [employer] Part D will review the request and will issue a decision, typically within 1-3 days from your submission

## 2021-12-02 ENCOUNTER — Ambulatory Visit: Payer: Medicare HMO | Admitting: Internal Medicine

## 2021-12-07 ENCOUNTER — Ambulatory Visit (HOSPITAL_COMMUNITY): Payer: Medicare HMO | Attending: Student in an Organized Health Care Education/Training Program

## 2021-12-08 ENCOUNTER — Encounter (HOSPITAL_COMMUNITY): Payer: Self-pay | Admitting: Internal Medicine

## 2021-12-08 NOTE — Progress Notes (Signed)
Attempted to obtain medical history via telephone, unable to reach at this time. Unable to leave a voicemail to return pre surgical testing department's phone call.  ?

## 2021-12-15 NOTE — Progress Notes (Signed)
Called patient regarding manometry scheduled for tomorrow.Patient states that she was not able to come as her legs are currently weak. She wanted the office to call her to reschedule. ?

## 2021-12-16 ENCOUNTER — Ambulatory Visit (HOSPITAL_COMMUNITY): Admission: RE | Admit: 2021-12-16 | Payer: Medicaid Other | Source: Home / Self Care | Admitting: Internal Medicine

## 2021-12-16 ENCOUNTER — Encounter (HOSPITAL_COMMUNITY): Admission: RE | Payer: Self-pay | Source: Home / Self Care

## 2021-12-16 SURGERY — MANOMETRY, ESOPHAGUS
Anesthesia: Monitor Anesthesia Care

## 2021-12-29 ENCOUNTER — Other Ambulatory Visit: Payer: Self-pay

## 2021-12-29 DIAGNOSIS — R1013 Epigastric pain: Secondary | ICD-10-CM

## 2021-12-29 DIAGNOSIS — R1310 Dysphagia, unspecified: Secondary | ICD-10-CM

## 2021-12-31 ENCOUNTER — Telehealth: Payer: Self-pay

## 2021-12-31 ENCOUNTER — Other Ambulatory Visit: Payer: Self-pay

## 2021-12-31 ENCOUNTER — Encounter: Payer: Self-pay | Admitting: Internal Medicine

## 2021-12-31 DIAGNOSIS — R1310 Dysphagia, unspecified: Secondary | ICD-10-CM | POA: Insufficient documentation

## 2021-12-31 DIAGNOSIS — R1013 Epigastric pain: Secondary | ICD-10-CM

## 2021-12-31 NOTE — Telephone Encounter (Signed)
Following message received from Dr. Lorenso Courier: ? ?Sharyn Creamer, MD  Aleatha Borer, LPN ?Hi Florentino Laabs, please call the patient to reschedule her manometry   ?Previous Messages ?Attached Notes ? ?Progress Notes by Paulo Fruit, RN at 12/15/2021  1:32 PM ? ?Author: Paulo Fruit, RN Service: Endoscopy Author Type: Registered Nurse  ?Filed: 12/15/2021  1:42 PM Date of Service: 12/15/2021  1:32 PM Note Type: Progress Notes  ?Status: Signed Editor: Paulo Fruit, RN (Registered Nurse)  ?Called patient regarding manometry scheduled for tomorrow.Patient states that she was not able to come as her legs are currently weak. She wanted the office to call her to reschedule.  ?  ? ?Pt has been rescheduled for her Esophageal Manometry on 05/05/22 @ 1230pm. Prep instructions mailed to pt home address. Amb referral placed for auth purposes. ?

## 2022-03-27 ENCOUNTER — Encounter: Payer: Self-pay | Admitting: *Deleted

## 2022-04-07 ENCOUNTER — Other Ambulatory Visit: Payer: Self-pay

## 2022-04-07 DIAGNOSIS — K209 Esophagitis, unspecified without bleeding: Secondary | ICD-10-CM

## 2022-04-07 DIAGNOSIS — K2100 Gastro-esophageal reflux disease with esophagitis, without bleeding: Secondary | ICD-10-CM

## 2022-04-07 MED ORDER — PANTOPRAZOLE SODIUM 40 MG PO TBEC
40.0000 mg | DELAYED_RELEASE_TABLET | Freq: Two times a day (BID) | ORAL | 1 refills | Status: DC
Start: 2022-04-07 — End: 2022-04-08

## 2022-04-07 MED ORDER — HYDRALAZINE HCL 25 MG PO TABS
25.0000 mg | ORAL_TABLET | Freq: Three times a day (TID) | ORAL | 3 refills | Status: DC
Start: 2022-04-07 — End: 2022-04-08

## 2022-04-08 ENCOUNTER — Telehealth: Payer: Self-pay | Admitting: Student

## 2022-04-08 DIAGNOSIS — K209 Esophagitis, unspecified without bleeding: Secondary | ICD-10-CM

## 2022-04-08 DIAGNOSIS — K2100 Gastro-esophageal reflux disease with esophagitis, without bleeding: Secondary | ICD-10-CM

## 2022-04-08 MED ORDER — PANTOPRAZOLE SODIUM 40 MG PO TBEC: 40.0000 mg | DELAYED_RELEASE_TABLET | Freq: Two times a day (BID) | ORAL | 1 refills | Status: DC

## 2022-04-08 MED ORDER — HYDRALAZINE HCL 25 MG PO TABS
25.0000 mg | ORAL_TABLET | Freq: Three times a day (TID) | ORAL | 3 refills | Status: DC
Start: 2022-04-08 — End: 2022-08-11

## 2022-04-08 NOTE — Telephone Encounter (Signed)
Rec'd a call from [facility] stating the 2 rx's were sent to their pharmacy in [place].  Per their Pharmacy pleas contact [facility] instead.  A Fax number was provided As [phone].  \ hydrALAZINE HCl 25 mg Oral Every 8 hours  Pantoprazole Sodium 40 mg Oral 2 times daily

## 2022-04-28 ENCOUNTER — Encounter (HOSPITAL_COMMUNITY): Payer: Self-pay | Admitting: Internal Medicine

## 2022-04-28 NOTE — Progress Notes (Signed)
Attempted to obtain medical history via telephone, unable to reach at this time. Unable to leave voicemail to return pre surgical testing department's phone call,due to phone disconnection.

## 2022-05-05 ENCOUNTER — Encounter (HOSPITAL_COMMUNITY): Admission: RE | Payer: Self-pay | Source: Home / Self Care

## 2022-05-05 ENCOUNTER — Ambulatory Visit (HOSPITAL_COMMUNITY): Admission: RE | Admit: 2022-05-05 | Payer: Medicare HMO | Source: Home / Self Care | Admitting: Internal Medicine

## 2022-05-05 DIAGNOSIS — R1013 Epigastric pain: Secondary | ICD-10-CM | POA: Insufficient documentation

## 2022-05-05 DIAGNOSIS — R1310 Dysphagia, unspecified: Secondary | ICD-10-CM | POA: Insufficient documentation

## 2022-05-05 SURGERY — MANOMETRY, ESOPHAGUS
Anesthesia: Monitor Anesthesia Care

## 2022-05-05 NOTE — Progress Notes (Signed)
Patient scheduled for manometry today.  No show.  Message sent to Dr Lorenso Courier.

## 2022-06-28 ENCOUNTER — Telehealth: Payer: Self-pay | Admitting: *Deleted

## 2022-06-28 NOTE — Telephone Encounter (Signed)
Returned call to Joe, PT with [facility]. Requesting VO for HH PT 2 week 4 and 1 week 4 to work on strength and balance. Verbal auth given. Will route to [facility] for agreement/denial.

## 2022-06-28 NOTE — Telephone Encounter (Signed)
Please call [place] need verbal orders. Call at [phone]

## 2022-06-29 NOTE — Telephone Encounter (Signed)
Agree, thank you

## 2022-07-07 ENCOUNTER — Encounter: Payer: Medicare HMO | Admitting: Internal Medicine

## 2022-07-07 ENCOUNTER — Encounter: Payer: Self-pay | Admitting: Student

## 2022-07-07 NOTE — Progress Notes (Deleted)
Patient evaluated at [facility] ED 09/23 due to concerns that here AV fistula was clotting. She stated at that time that there were clots coming out of the fistula after completing her HD session, but according to her daughter this is not an uncommon occurrence. Work-up at the ED was largely unremarkable and she was sent up.   She was seen prior to that 09/03 at [facility] ED and was found to have a metabolic acidosis with hyperkalemia secondary to missed HD session. Her HD days are TThSa. She has been discharged from dialysis centers previously due to several missed sessions.   HTN: Current regimen is losartan 50 mg daily, hydralazine 25 mg TID. BP today *, *.  A fib: Current regimen of carvedilol 12.5 mg BID, Eliquis 2.5 mg BID.  Esrd  Hx cva

## 2022-07-19 ENCOUNTER — Other Ambulatory Visit: Payer: Self-pay

## 2022-07-19 DIAGNOSIS — K209 Esophagitis, unspecified without bleeding: Secondary | ICD-10-CM

## 2022-07-19 DIAGNOSIS — K2100 Gastro-esophageal reflux disease with esophagitis, without bleeding: Secondary | ICD-10-CM

## 2022-07-19 MED ORDER — PANTOPRAZOLE SODIUM 40 MG PO TBEC: 40.0000 mg | DELAYED_RELEASE_TABLET | Freq: Two times a day (BID) | ORAL | 1 refills | Status: DC

## 2022-07-21 ENCOUNTER — Encounter: Payer: Medicare HMO | Admitting: Student

## 2022-07-31 ENCOUNTER — Inpatient Hospital Stay (HOSPITAL_COMMUNITY)
Admission: EM | Admit: 2022-07-31 | Discharge: 2022-08-11 | DRG: 280 | Disposition: A | Discharge status: Home Health | Payer: Medicare HMO | Attending: Internal Medicine | Admitting: Internal Medicine

## 2022-07-31 ENCOUNTER — Emergency Department (HOSPITAL_COMMUNITY): Payer: Medicare HMO

## 2022-07-31 ENCOUNTER — Encounter (HOSPITAL_COMMUNITY): Payer: Self-pay

## 2022-07-31 ENCOUNTER — Other Ambulatory Visit: Payer: Self-pay

## 2022-07-31 DIAGNOSIS — S40021A Contusion of right upper arm, initial encounter: Secondary | ICD-10-CM | POA: Diagnosis present

## 2022-07-31 DIAGNOSIS — Z885 Allergy status to narcotic agent status: Secondary | ICD-10-CM

## 2022-07-31 DIAGNOSIS — N2581 Secondary hyperparathyroidism of renal origin: Secondary | ICD-10-CM | POA: Diagnosis present

## 2022-07-31 DIAGNOSIS — F1410 Cocaine abuse, uncomplicated: Secondary | ICD-10-CM | POA: Diagnosis present

## 2022-07-31 DIAGNOSIS — E785 Hyperlipidemia, unspecified: Secondary | ICD-10-CM | POA: Diagnosis present

## 2022-07-31 DIAGNOSIS — R079 Chest pain, unspecified: Principal | ICD-10-CM

## 2022-07-31 DIAGNOSIS — Z888 Allergy status to other drugs, medicaments and biological substances status: Secondary | ICD-10-CM

## 2022-07-31 DIAGNOSIS — S7001XA Contusion of right hip, initial encounter: Secondary | ICD-10-CM | POA: Diagnosis present

## 2022-07-31 DIAGNOSIS — Z789 Other specified health status: Secondary | ICD-10-CM

## 2022-07-31 DIAGNOSIS — G8929 Other chronic pain: Secondary | ICD-10-CM

## 2022-07-31 DIAGNOSIS — T82590A Other mechanical complication of surgically created arteriovenous fistula, initial encounter: Secondary | ICD-10-CM | POA: Diagnosis not present

## 2022-07-31 DIAGNOSIS — Z87891 Personal history of nicotine dependence: Secondary | ICD-10-CM

## 2022-07-31 DIAGNOSIS — J9601 Acute respiratory failure with hypoxia: Secondary | ICD-10-CM | POA: Diagnosis present

## 2022-07-31 DIAGNOSIS — Z79899 Other long term (current) drug therapy: Secondary | ICD-10-CM

## 2022-07-31 DIAGNOSIS — Z8673 Personal history of transient ischemic attack (TIA), and cerebral infarction without residual deficits: Secondary | ICD-10-CM

## 2022-07-31 DIAGNOSIS — N63 Unspecified lump in unspecified breast: Secondary | ICD-10-CM

## 2022-07-31 DIAGNOSIS — R7989 Other specified abnormal findings of blood chemistry: Secondary | ICD-10-CM

## 2022-07-31 DIAGNOSIS — N186 End stage renal disease: Secondary | ICD-10-CM

## 2022-07-31 DIAGNOSIS — M1711 Unilateral primary osteoarthritis, right knee: Secondary | ICD-10-CM | POA: Diagnosis present

## 2022-07-31 DIAGNOSIS — Z7901 Long term (current) use of anticoagulants: Secondary | ICD-10-CM

## 2022-07-31 DIAGNOSIS — F322 Major depressive disorder, single episode, severe without psychotic features: Secondary | ICD-10-CM | POA: Diagnosis present

## 2022-07-31 DIAGNOSIS — I4891 Unspecified atrial fibrillation: Secondary | ICD-10-CM

## 2022-07-31 DIAGNOSIS — I132 Hypertensive heart and chronic kidney disease with heart failure and with stage 5 chronic kidney disease, or end stage renal disease: Secondary | ICD-10-CM | POA: Diagnosis present

## 2022-07-31 DIAGNOSIS — I21A1 Myocardial infarction type 2: Secondary | ICD-10-CM | POA: Diagnosis present

## 2022-07-31 DIAGNOSIS — Z833 Family history of diabetes mellitus: Secondary | ICD-10-CM

## 2022-07-31 DIAGNOSIS — I5023 Acute on chronic systolic (congestive) heart failure: Secondary | ICD-10-CM | POA: Diagnosis present

## 2022-07-31 DIAGNOSIS — Y829 Unspecified medical devices associated with adverse incidents: Secondary | ICD-10-CM | POA: Diagnosis present

## 2022-07-31 DIAGNOSIS — M1611 Unilateral primary osteoarthritis, right hip: Secondary | ICD-10-CM | POA: Diagnosis present

## 2022-07-31 DIAGNOSIS — D638 Anemia in other chronic diseases classified elsewhere: Secondary | ICD-10-CM

## 2022-07-31 DIAGNOSIS — J189 Pneumonia, unspecified organism: Secondary | ICD-10-CM | POA: Diagnosis present

## 2022-07-31 DIAGNOSIS — Y929 Unspecified place or not applicable: Secondary | ICD-10-CM

## 2022-07-31 DIAGNOSIS — F419 Anxiety disorder, unspecified: Secondary | ICD-10-CM | POA: Diagnosis present

## 2022-07-31 DIAGNOSIS — G894 Chronic pain syndrome: Secondary | ICD-10-CM

## 2022-07-31 DIAGNOSIS — J9811 Atelectasis: Secondary | ICD-10-CM | POA: Diagnosis present

## 2022-07-31 DIAGNOSIS — G4700 Insomnia, unspecified: Secondary | ICD-10-CM | POA: Diagnosis present

## 2022-07-31 DIAGNOSIS — R197 Diarrhea, unspecified: Secondary | ICD-10-CM | POA: Diagnosis present

## 2022-07-31 DIAGNOSIS — M898X9 Other specified disorders of bone, unspecified site: Secondary | ICD-10-CM | POA: Diagnosis present

## 2022-07-31 DIAGNOSIS — K219 Gastro-esophageal reflux disease without esophagitis: Secondary | ICD-10-CM | POA: Diagnosis present

## 2022-07-31 DIAGNOSIS — Z83438 Family history of other disorder of lipoprotein metabolism and other lipidemia: Secondary | ICD-10-CM

## 2022-07-31 DIAGNOSIS — Z8249 Family history of ischemic heart disease and other diseases of the circulatory system: Secondary | ICD-10-CM

## 2022-07-31 DIAGNOSIS — Z86718 Personal history of other venous thrombosis and embolism: Secondary | ICD-10-CM

## 2022-07-31 DIAGNOSIS — D631 Anemia in chronic kidney disease: Secondary | ICD-10-CM | POA: Diagnosis present

## 2022-07-31 DIAGNOSIS — S20212A Contusion of left front wall of thorax, initial encounter: Secondary | ICD-10-CM | POA: Diagnosis present

## 2022-07-31 DIAGNOSIS — I10 Essential (primary) hypertension: Secondary | ICD-10-CM | POA: Diagnosis present

## 2022-07-31 DIAGNOSIS — Z96652 Presence of left artificial knee joint: Secondary | ICD-10-CM | POA: Diagnosis present

## 2022-07-31 DIAGNOSIS — F329 Major depressive disorder, single episode, unspecified: Secondary | ICD-10-CM | POA: Insufficient documentation

## 2022-07-31 DIAGNOSIS — Z59819 Housing instability, housed unspecified: Secondary | ICD-10-CM

## 2022-07-31 DIAGNOSIS — Z992 Dependence on renal dialysis: Secondary | ICD-10-CM

## 2022-07-31 DIAGNOSIS — I509 Heart failure, unspecified: Secondary | ICD-10-CM

## 2022-07-31 DIAGNOSIS — Z886 Allergy status to analgesic agent status: Secondary | ICD-10-CM

## 2022-07-31 DIAGNOSIS — Z20822 Contact with and (suspected) exposure to covid-19: Secondary | ICD-10-CM | POA: Diagnosis present

## 2022-07-31 DIAGNOSIS — J440 Chronic obstructive pulmonary disease with acute lower respiratory infection: Secondary | ICD-10-CM | POA: Diagnosis present

## 2022-07-31 LAB — CBC WITH DIFFERENTIAL/PLATELET
Abs Immature Granulocytes: 0.03 10*3/uL (ref 0.00–0.07)
Basophils Absolute: 0 10*3/uL (ref 0.0–0.1)
Basophils Relative: 1 %
Eosinophils Absolute: 0.1 10*3/uL (ref 0.0–0.5)
Eosinophils Relative: 2 %
HCT: 24.6 % — ABNORMAL LOW (ref 36.0–46.0)
Hemoglobin: 8.1 g/dL — ABNORMAL LOW (ref 12.0–15.0)
Immature Granulocytes: 0 %
Lymphocytes Relative: 9 %
Lymphs Abs: 0.6 10*3/uL — ABNORMAL LOW (ref 0.7–4.0)
MCH: 27.6 pg (ref 26.0–34.0)
MCHC: 32.9 g/dL (ref 30.0–36.0)
MCV: 84 fL (ref 80.0–100.0)
Monocytes Absolute: 1 10*3/uL (ref 0.1–1.0)
Monocytes Relative: 14 %
Neutro Abs: 5.2 10*3/uL (ref 1.7–7.7)
Neutrophils Relative %: 74 %
Platelets: 202 10*3/uL (ref 150–400)
RBC: 2.93 MIL/uL — ABNORMAL LOW (ref 3.87–5.11)
RDW: 17.2 % — ABNORMAL HIGH (ref 11.5–15.5)
WBC: 7 10*3/uL (ref 4.0–10.5)
nRBC: 0 % (ref 0.0–0.2)

## 2022-07-31 LAB — COMPREHENSIVE METABOLIC PANEL
ALT: 50 U/L — ABNORMAL HIGH (ref 0–44)
AST: 50 U/L — ABNORMAL HIGH (ref 15–41)
Albumin: 3 g/dL — ABNORMAL LOW (ref 3.5–5.0)
Alkaline Phosphatase: 144 U/L — ABNORMAL HIGH (ref 38–126)
Anion gap: 12 (ref 5–15)
BUN: 66 mg/dL — ABNORMAL HIGH (ref 8–23)
CO2: 25 mmol/L (ref 22–32)
Calcium: 8.4 mg/dL — ABNORMAL LOW (ref 8.9–10.3)
Chloride: 103 mmol/L (ref 98–111)
Creatinine, Ser: 5.34 mg/dL — ABNORMAL HIGH (ref 0.44–1.00)
GFR, Estimated: 8 mL/min — ABNORMAL LOW (ref >60)
Glucose, Bld: 107 mg/dL — ABNORMAL HIGH (ref 70–99)
Potassium: 4 mmol/L (ref 3.5–5.1)
Sodium: 140 mmol/L (ref 135–145)
Total Bilirubin: 0.6 mg/dL (ref 0.3–1.2)
Total Protein: 6.8 g/dL (ref 6.5–8.1)

## 2022-07-31 LAB — TROPONIN I (HIGH SENSITIVITY)
Troponin I (High Sensitivity): 180 ng/L (ref <18)
Troponin I (High Sensitivity): 199 ng/L (ref <18)

## 2022-07-31 MED ORDER — ACETAMINOPHEN 325 MG PO TABS
650.0000 mg | ORAL_TABLET | Freq: Once | ORAL | Status: DC
  Filled 2022-07-31: qty 2

## 2022-07-31 NOTE — ED Notes (Signed)
Patient transported to CT 
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
## 2022-07-31 NOTE — ED Notes (Signed)
Called pt x3 for vitals, no response. 
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
## 2022-07-31 NOTE — ED Notes (Signed)
Troponin 180. Dr. Kathrynn Humble aware. Pt to be prioritized for a room.

## 2022-07-31 NOTE — ED Notes (Signed)
Triage nurse made aware pt temp of 100.7

## 2022-08-01 ENCOUNTER — Emergency Department (HOSPITAL_COMMUNITY): Payer: Medicare HMO

## 2022-08-01 DIAGNOSIS — J9601 Acute respiratory failure with hypoxia: Secondary | ICD-10-CM | POA: Diagnosis present

## 2022-08-01 LAB — PROCALCITONIN: Procalcitonin: 0.75 ng/mL

## 2022-08-01 LAB — CBC
HCT: 24.5 % — ABNORMAL LOW (ref 36.0–46.0)
Hemoglobin: 7.9 g/dL — ABNORMAL LOW (ref 12.0–15.0)
MCH: 27.5 pg (ref 26.0–34.0)
MCHC: 32.2 g/dL (ref 30.0–36.0)
MCV: 85.4 fL (ref 80.0–100.0)
Platelets: 184 10*3/uL (ref 150–400)
RBC: 2.87 MIL/uL — ABNORMAL LOW (ref 3.87–5.11)
RDW: 17.3 % — ABNORMAL HIGH (ref 11.5–15.5)
WBC: 5.6 10*3/uL (ref 4.0–10.5)
nRBC: 0 % (ref 0.0–0.2)

## 2022-08-01 LAB — COMPREHENSIVE METABOLIC PANEL
ALT: 44 U/L (ref 0–44)
AST: 41 U/L (ref 15–41)
Albumin: 2.6 g/dL — ABNORMAL LOW (ref 3.5–5.0)
Alkaline Phosphatase: 114 U/L (ref 38–126)
Anion gap: 13 (ref 5–15)
BUN: 70 mg/dL — ABNORMAL HIGH (ref 8–23)
CO2: 25 mmol/L (ref 22–32)
Calcium: 7.8 mg/dL — ABNORMAL LOW (ref 8.9–10.3)
Chloride: 103 mmol/L (ref 98–111)
Creatinine, Ser: 5.92 mg/dL — ABNORMAL HIGH (ref 0.44–1.00)
GFR, Estimated: 7 mL/min — ABNORMAL LOW (ref >60)
Glucose, Bld: 76 mg/dL (ref 70–99)
Potassium: 4.1 mmol/L (ref 3.5–5.1)
Sodium: 141 mmol/L (ref 135–145)
Total Bilirubin: 0.6 mg/dL (ref 0.3–1.2)
Total Protein: 6 g/dL — ABNORMAL LOW (ref 6.5–8.1)

## 2022-08-01 LAB — SARS CORONAVIRUS 2 BY RT PCR: SARS Coronavirus 2 by RT PCR: NEGATIVE

## 2022-08-01 LAB — BRAIN NATRIURETIC PEPTIDE: B Natriuretic Peptide: 2412.7 pg/mL — ABNORMAL HIGH (ref 0.0–100.0)

## 2022-08-01 MED ORDER — LIDOCAINE 5 % EX PTCH
1.0000 | MEDICATED_PATCH | CUTANEOUS | Status: DC
  Administered 2022-08-01: 1 via TRANSDERMAL
  Filled 2022-08-01 (×3): qty 1

## 2022-08-01 MED ORDER — HYDRALAZINE HCL 25 MG PO TABS
25.0000 mg | ORAL_TABLET | Freq: Three times a day (TID) | ORAL | Status: DC
  Administered 2022-08-01 – 2022-08-04 (×9): 25 mg via ORAL
  Filled 2022-08-01 (×9): qty 1

## 2022-08-01 MED ORDER — KETOROLAC TROMETHAMINE 30 MG/ML IJ SOLN
15.0000 mg | Freq: Once | INTRAMUSCULAR | Status: AC
  Administered 2022-08-01: 15 mg via INTRAVENOUS
  Filled 2022-08-01: qty 1

## 2022-08-01 MED ORDER — VANCOMYCIN HCL 1750 MG/350ML IV SOLN
1750.0000 mg | Freq: Once | INTRAVENOUS | Status: AC
  Administered 2022-08-01: 1750 mg via INTRAVENOUS
  Filled 2022-08-01: qty 350

## 2022-08-01 MED ORDER — VANCOMYCIN HCL 750 MG/150ML IV SOLN: 750.0000 mg | INTRAVENOUS | Status: DC

## 2022-08-01 MED ORDER — APIXABAN 2.5 MG PO TABS
2.5000 mg | ORAL_TABLET | Freq: Two times a day (BID) | ORAL | Status: DC
  Administered 2022-08-01 (×2): 2.5 mg via ORAL
  Filled 2022-08-01 (×2): qty 1

## 2022-08-01 MED ORDER — ACETAMINOPHEN 325 MG PO TABS
650.0000 mg | ORAL_TABLET | Freq: Four times a day (QID) | ORAL | Status: DC | PRN
  Filled 2022-08-01: qty 2

## 2022-08-01 MED ORDER — CARVEDILOL 6.25 MG PO TABS
6.2500 mg | ORAL_TABLET | Freq: Two times a day (BID) | ORAL | Status: DC
  Administered 2022-08-01 – 2022-08-06 (×10): 6.25 mg via ORAL
  Filled 2022-08-01: qty 1
  Filled 2022-08-01: qty 2
  Filled 2022-08-01 (×9): qty 1

## 2022-08-01 MED ORDER — SODIUM CHLORIDE 0.9 % IV SOLN
1.0000 g | Freq: Once | INTRAVENOUS | Status: AC
  Administered 2022-08-01: 1 g via INTRAVENOUS
  Filled 2022-08-01: qty 10

## 2022-08-01 MED ORDER — SENNA 8.6 MG PO TABS
1.0000 | ORAL_TABLET | Freq: Two times a day (BID) | ORAL | Status: DC
  Administered 2022-08-01 – 2022-08-07 (×7): 8.6 mg via ORAL
  Filled 2022-08-01 (×17): qty 1

## 2022-08-01 MED ORDER — ATORVASTATIN CALCIUM 10 MG PO TABS
20.0000 mg | ORAL_TABLET | Freq: Every day | ORAL | Status: DC
  Administered 2022-08-01 – 2022-08-11 (×11): 20 mg via ORAL
  Filled 2022-08-01 (×11): qty 2

## 2022-08-01 MED ORDER — MIRTAZAPINE 15 MG PO TBDP
7.5000 mg | ORAL_TABLET | Freq: Every day | ORAL | Status: DC
  Administered 2022-08-01 – 2022-08-10 (×10): 7.5 mg via ORAL
  Filled 2022-08-01 (×13): qty 0.5

## 2022-08-01 MED ORDER — AMIODARONE HCL 200 MG PO TABS
200.0000 mg | ORAL_TABLET | Freq: Every day | ORAL | Status: DC
  Administered 2022-08-01 – 2022-08-11 (×10): 200 mg via ORAL
  Filled 2022-08-01 (×10): qty 1

## 2022-08-01 MED ORDER — CHLORHEXIDINE GLUCONATE CLOTH 2 % EX PADS
6.0000 | MEDICATED_PAD | Freq: Every day | CUTANEOUS | Status: DC
  Administered 2022-08-02 – 2022-08-06 (×5): 6 via TOPICAL

## 2022-08-01 MED ORDER — IPRATROPIUM-ALBUTEROL 0.5-2.5 (3) MG/3ML IN SOLN: 3.0000 mL | RESPIRATORY_TRACT | Status: DC | PRN

## 2022-08-01 MED ORDER — METHOCARBAMOL 500 MG PO TABS
500.0000 mg | ORAL_TABLET | Freq: Once | ORAL | Status: AC
  Administered 2022-08-01: 500 mg via ORAL
  Filled 2022-08-01: qty 1

## 2022-08-01 MED ORDER — SODIUM CHLORIDE 0.9 % IV SOLN
500.0000 mg | Freq: Once | INTRAVENOUS | Status: AC
  Administered 2022-08-01: 500 mg via INTRAVENOUS
  Filled 2022-08-01: qty 5

## 2022-08-01 MED ORDER — SODIUM CHLORIDE 0.9 % IV SOLN
1.0000 g | INTRAVENOUS | Status: DC
  Administered 2022-08-02: 1 g via INTRAVENOUS
  Filled 2022-08-01: qty 10

## 2022-08-01 MED ORDER — ISOSORBIDE DINITRATE 10 MG PO TABS
20.0000 mg | ORAL_TABLET | Freq: Three times a day (TID) | ORAL | Status: DC
  Administered 2022-08-01 – 2022-08-11 (×27): 20 mg via ORAL
  Filled 2022-08-01 (×29): qty 2

## 2022-08-01 MED ORDER — ACETAMINOPHEN 650 MG RE SUPP: 650.0000 mg | Freq: Four times a day (QID) | RECTAL | Status: DC | PRN

## 2022-08-01 MED ORDER — DARBEPOETIN ALFA 150 MCG/0.3ML IJ SOSY
150.0000 ug | PREFILLED_SYRINGE | INTRAMUSCULAR | Status: DC
  Administered 2022-08-02 – 2022-08-09 (×2): 150 ug via SUBCUTANEOUS
  Filled 2022-08-01 (×2): qty 0.3
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
## 2022-08-01 NOTE — ED Notes (Signed)
Social work at bedside.  
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
## 2022-08-01 NOTE — Progress Notes (Signed)
Pharmacy Antibiotic Note  Audrey Hill is a 67 y.o. female admitted on 07/31/2022 presenting with concern for sepsis.  Pharmacy has been consulted for vancomycin dosing.  Gram negative coverage per MD.  ESRD-HD usually TTS  Plan: Vancomycin 1750 mg IV x 1, then 750 mg IV qHD Monitor HD schedule, Cx and clinical progression to narrow Vancomycin random level as needed with HD     Temp (24hrs), Avg:99.6 F (37.6 C), Min:98.4 F (36.9 C), Max:100.7 F (38.2 C)  Recent Labs  Lab 07/31/22 1557 08/01/22 0912  WBC 7.0 5.6  CREATININE 5.34* 5.92*    CrCl cannot be calculated (Unknown ideal weight.).    Allergies  Allergen Reactions   Lisinopril Rash and Swelling   Metoclopramide     Other reaction(s): Other Patient has dystonia with administration of drug.  Other reaction(s): Other (See Comments) Patient has dystonia with administration of drug.  Patient has dystonia with administration of drug.     Nortriptyline     Other reaction(s): Other (See Comments) unknown "Mini stroke"    Tramadol Nausea And Vomiting        Acetaminophen Rash and Other (See Comments)    .    Nitroglycerin Hives and Rash   Ibuprofen Other (See Comments)    Upset gi   Norvasc [Amlodipine Besylate] Swelling   Nsaids Other (See Comments)    GI upset    Bertis Ruddy, PharmD Clinical Pharmacist ED Pharmacist Phone # [phone] 08/01/2022 11:39 AM

## 2022-08-01 NOTE — ED Notes (Signed)
MD Eulas Post notified and aware about patient current pain/soreness 9/10.  No new orders at this time.
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
## 2022-08-02 ENCOUNTER — Observation Stay (HOSPITAL_COMMUNITY): Payer: Medicare HMO

## 2022-08-02 DIAGNOSIS — I132 Hypertensive heart and chronic kidney disease with heart failure and with stage 5 chronic kidney disease, or end stage renal disease: Secondary | ICD-10-CM | POA: Diagnosis present

## 2022-08-02 DIAGNOSIS — Z789 Other specified health status: Secondary | ICD-10-CM

## 2022-08-02 DIAGNOSIS — T82590A Other mechanical complication of surgically created arteriovenous fistula, initial encounter: Secondary | ICD-10-CM | POA: Diagnosis present

## 2022-08-02 DIAGNOSIS — Z886 Allergy status to analgesic agent status: Secondary | ICD-10-CM | POA: Diagnosis not present

## 2022-08-02 DIAGNOSIS — F322 Major depressive disorder, single episode, severe without psychotic features: Secondary | ICD-10-CM | POA: Diagnosis present

## 2022-08-02 DIAGNOSIS — Z992 Dependence on renal dialysis: Secondary | ICD-10-CM | POA: Diagnosis not present

## 2022-08-02 DIAGNOSIS — J9601 Acute respiratory failure with hypoxia: Secondary | ICD-10-CM

## 2022-08-02 DIAGNOSIS — N2581 Secondary hyperparathyroidism of renal origin: Secondary | ICD-10-CM | POA: Diagnosis present

## 2022-08-02 DIAGNOSIS — J189 Pneumonia, unspecified organism: Secondary | ICD-10-CM | POA: Diagnosis present

## 2022-08-02 DIAGNOSIS — M1711 Unilateral primary osteoarthritis, right knee: Secondary | ICD-10-CM | POA: Diagnosis present

## 2022-08-02 DIAGNOSIS — Z87891 Personal history of nicotine dependence: Secondary | ICD-10-CM | POA: Diagnosis not present

## 2022-08-02 DIAGNOSIS — M1611 Unilateral primary osteoarthritis, right hip: Secondary | ICD-10-CM | POA: Diagnosis present

## 2022-08-02 DIAGNOSIS — I5020 Unspecified systolic (congestive) heart failure: Secondary | ICD-10-CM

## 2022-08-02 DIAGNOSIS — N186 End stage renal disease: Secondary | ICD-10-CM

## 2022-08-02 DIAGNOSIS — R079 Chest pain, unspecified: Secondary | ICD-10-CM | POA: Diagnosis not present

## 2022-08-02 DIAGNOSIS — J449 Chronic obstructive pulmonary disease, unspecified: Secondary | ICD-10-CM | POA: Diagnosis not present

## 2022-08-02 DIAGNOSIS — I5023 Acute on chronic systolic (congestive) heart failure: Secondary | ICD-10-CM | POA: Diagnosis present

## 2022-08-02 DIAGNOSIS — G894 Chronic pain syndrome: Secondary | ICD-10-CM | POA: Diagnosis present

## 2022-08-02 DIAGNOSIS — Y829 Unspecified medical devices associated with adverse incidents: Secondary | ICD-10-CM | POA: Diagnosis present

## 2022-08-02 DIAGNOSIS — D631 Anemia in chronic kidney disease: Secondary | ICD-10-CM | POA: Diagnosis present

## 2022-08-02 DIAGNOSIS — I509 Heart failure, unspecified: Secondary | ICD-10-CM

## 2022-08-02 DIAGNOSIS — M549 Dorsalgia, unspecified: Secondary | ICD-10-CM | POA: Diagnosis not present

## 2022-08-02 DIAGNOSIS — I10 Essential (primary) hypertension: Secondary | ICD-10-CM | POA: Diagnosis not present

## 2022-08-02 DIAGNOSIS — Z7901 Long term (current) use of anticoagulants: Secondary | ICD-10-CM | POA: Diagnosis not present

## 2022-08-02 DIAGNOSIS — Z79899 Other long term (current) drug therapy: Secondary | ICD-10-CM | POA: Diagnosis not present

## 2022-08-02 DIAGNOSIS — E785 Hyperlipidemia, unspecified: Secondary | ICD-10-CM | POA: Diagnosis present

## 2022-08-02 DIAGNOSIS — J440 Chronic obstructive pulmonary disease with acute lower respiratory infection: Secondary | ICD-10-CM | POA: Diagnosis present

## 2022-08-02 DIAGNOSIS — G8929 Other chronic pain: Secondary | ICD-10-CM | POA: Diagnosis not present

## 2022-08-02 DIAGNOSIS — N63 Unspecified lump in unspecified breast: Secondary | ICD-10-CM | POA: Diagnosis present

## 2022-08-02 DIAGNOSIS — D500 Iron deficiency anemia secondary to blood loss (chronic): Secondary | ICD-10-CM | POA: Diagnosis not present

## 2022-08-02 DIAGNOSIS — J9811 Atelectasis: Secondary | ICD-10-CM | POA: Diagnosis present

## 2022-08-02 DIAGNOSIS — F1410 Cocaine abuse, uncomplicated: Secondary | ICD-10-CM | POA: Diagnosis present

## 2022-08-02 DIAGNOSIS — I4891 Unspecified atrial fibrillation: Secondary | ICD-10-CM | POA: Diagnosis present

## 2022-08-02 DIAGNOSIS — Y929 Unspecified place or not applicable: Secondary | ICD-10-CM | POA: Diagnosis not present

## 2022-08-02 DIAGNOSIS — I21A1 Myocardial infarction type 2: Secondary | ICD-10-CM | POA: Diagnosis present

## 2022-08-02 DIAGNOSIS — Z20822 Contact with and (suspected) exposure to covid-19: Secondary | ICD-10-CM | POA: Diagnosis present

## 2022-08-02 LAB — CBC
HCT: 24.7 % — ABNORMAL LOW (ref 36.0–46.0)
Hemoglobin: 7.8 g/dL — ABNORMAL LOW (ref 12.0–15.0)
MCH: 26.7 pg (ref 26.0–34.0)
MCHC: 31.6 g/dL (ref 30.0–36.0)
MCV: 84.6 fL (ref 80.0–100.0)
Platelets: 202 10*3/uL (ref 150–400)
RBC: 2.92 MIL/uL — ABNORMAL LOW (ref 3.87–5.11)
RDW: 17.2 % — ABNORMAL HIGH (ref 11.5–15.5)
WBC: 6.5 10*3/uL (ref 4.0–10.5)
nRBC: 0 % (ref 0.0–0.2)

## 2022-08-02 LAB — URINALYSIS, ROUTINE W REFLEX MICROSCOPIC
Bilirubin Urine: NEGATIVE
Glucose, UA: NEGATIVE mg/dL
Ketones, ur: NEGATIVE mg/dL
Nitrite: NEGATIVE
Protein, ur: 100 mg/dL — AB
Specific Gravity, Urine: 1.011 (ref 1.005–1.030)
pH: 5 (ref 5.0–8.0)

## 2022-08-02 LAB — IRON AND TIBC
Iron: 32 ug/dL (ref 28–170)
Saturation Ratios: 13 % (ref 10.4–31.8)
TIBC: 239 ug/dL — ABNORMAL LOW (ref 250–450)
UIBC: 207 ug/dL

## 2022-08-02 LAB — COMPREHENSIVE METABOLIC PANEL
ALT: 48 U/L — ABNORMAL HIGH (ref 0–44)
AST: 45 U/L — ABNORMAL HIGH (ref 15–41)
Albumin: 2.8 g/dL — ABNORMAL LOW (ref 3.5–5.0)
Alkaline Phosphatase: 150 U/L — ABNORMAL HIGH (ref 38–126)
Anion gap: 12 (ref 5–15)
BUN: 84 mg/dL — ABNORMAL HIGH (ref 8–23)
CO2: 24 mmol/L (ref 22–32)
Calcium: 8.1 mg/dL — ABNORMAL LOW (ref 8.9–10.3)
Chloride: 105 mmol/L (ref 98–111)
Creatinine, Ser: 7.31 mg/dL — ABNORMAL HIGH (ref 0.44–1.00)
GFR, Estimated: 6 mL/min — ABNORMAL LOW (ref >60)
Glucose, Bld: 118 mg/dL — ABNORMAL HIGH (ref 70–99)
Potassium: 4.5 mmol/L (ref 3.5–5.1)
Sodium: 141 mmol/L (ref 135–145)
Total Bilirubin: 0.6 mg/dL (ref 0.3–1.2)
Total Protein: 6.4 g/dL — ABNORMAL LOW (ref 6.5–8.1)

## 2022-08-02 LAB — RAPID URINE DRUG SCREEN, HOSP PERFORMED
Amphetamines: NOT DETECTED
Barbiturates: NOT DETECTED
Benzodiazepines: NOT DETECTED
Cocaine: NOT DETECTED
Opiates: NOT DETECTED
Tetrahydrocannabinol: NOT DETECTED

## 2022-08-02 LAB — FERRITIN: Ferritin: 477 ng/mL — ABNORMAL HIGH (ref 11–307)

## 2022-08-02 LAB — HEPATITIS B SURFACE ANTIGEN: Hepatitis B Surface Ag: NONREACTIVE

## 2022-08-02 LAB — VITAMIN B12: Vitamin B-12: 549 pg/mL (ref 180–914)

## 2022-08-02 LAB — HEPATITIS B CORE ANTIBODY, TOTAL: Hep B Core Total Ab: NONREACTIVE

## 2022-08-02 LAB — FOLATE: Folate: 17 ng/mL (ref >5.9)

## 2022-08-02 MED ORDER — VANCOMYCIN HCL 750 MG/150ML IV SOLN
750.0000 mg | Freq: Once | INTRAVENOUS | Status: AC
  Administered 2022-08-02: 750 mg via INTRAVENOUS
  Filled 2022-08-02: qty 150

## 2022-08-02 MED ORDER — LOSARTAN POTASSIUM 50 MG PO TABS
50.0000 mg | ORAL_TABLET | Freq: Every day | ORAL | Status: DC
  Administered 2022-08-02 – 2022-08-04 (×2): 50 mg via ORAL
  Filled 2022-08-02 (×3): qty 1

## 2022-08-02 MED ORDER — HYDROMORPHONE HCL 2 MG PO TABS
1.0000 mg | ORAL_TABLET | Freq: Four times a day (QID) | ORAL | Status: DC | PRN
  Administered 2022-08-02 – 2022-08-03 (×3): 1 mg via ORAL
  Filled 2022-08-02 (×3): qty 1

## 2022-08-02 MED ORDER — APIXABAN 5 MG PO TABS
5.0000 mg | ORAL_TABLET | Freq: Two times a day (BID) | ORAL | Status: DC
  Administered 2022-08-02 – 2022-08-11 (×19): 5 mg via ORAL
  Filled 2022-08-02 (×20): qty 1
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
## 2022-08-02 NOTE — Progress Notes (Signed)
Pt receives out-pt HD at [facility] on TTS. Will assist as needed.   Melven Sartorius Renal Navigator [phone]
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
## 2022-08-02 NOTE — Progress Notes (Signed)
Received patient in bed to unit.  Alert and oriented.  Informed consent signed and in chart.   Treatment initiated:0710 Treatment completed: 7124  Patient tolerated well.  Transported back to the room  Alert, without acute distress.  Hand-off given to patient's nurse.   Access used: AVF Access issues: No  Total UF removed: 4000 Medication(s) given: N/A Post HD VS: 162/83, 79, 28, 97%RA Post HD weight: 77.2kgs   [facility]
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
## 2022-08-02 NOTE — Progress Notes (Signed)
PT Cancellation Note  Patient Details Name: Audrey Hill MRN: [id number] DOB: [birth year]   Cancelled Treatment:    Reason Eval/Treat Not Completed: Patient at procedure or test/unavailable (HD)   Garnie Borchardt B Aadarsh Cozort 08/02/2022, 7:00 AM [place] Office: [phone]

## 2022-08-02 NOTE — Care Management Obs Status (Cosign Needed)
[facility] NOTIFICATION   Patient Details  Name: Audrey Hill MRN: [id number] Date of Birth: [birth year]   Medicare Observation Status Notification Given:  Yes    Curlene Labrum, RN 08/02/2022, 12:24 PM

## 2022-08-03 DIAGNOSIS — N186 End stage renal disease: Secondary | ICD-10-CM | POA: Diagnosis not present

## 2022-08-03 DIAGNOSIS — Z992 Dependence on renal dialysis: Secondary | ICD-10-CM | POA: Diagnosis not present

## 2022-08-03 DIAGNOSIS — J9601 Acute respiratory failure with hypoxia: Secondary | ICD-10-CM | POA: Diagnosis not present

## 2022-08-03 DIAGNOSIS — D500 Iron deficiency anemia secondary to blood loss (chronic): Secondary | ICD-10-CM

## 2022-08-03 LAB — RENAL FUNCTION PANEL
Albumin: 2.7 g/dL — ABNORMAL LOW (ref 3.5–5.0)
Anion gap: 10 (ref 5–15)
BUN: 53 mg/dL — ABNORMAL HIGH (ref 8–23)
CO2: 27 mmol/L (ref 22–32)
Calcium: 8 mg/dL — ABNORMAL LOW (ref 8.9–10.3)
Chloride: 100 mmol/L (ref 98–111)
Creatinine, Ser: 5.25 mg/dL — ABNORMAL HIGH (ref 0.44–1.00)
GFR, Estimated: 8 mL/min — ABNORMAL LOW (ref >60)
Glucose, Bld: 98 mg/dL (ref 70–99)
Phosphorus: 4.7 mg/dL — ABNORMAL HIGH (ref 2.5–4.6)
Potassium: 4.3 mmol/L (ref 3.5–5.1)
Sodium: 137 mmol/L (ref 135–145)

## 2022-08-03 LAB — HEPATITIS B E ANTIBODY: Hep B E Ab: NEGATIVE

## 2022-08-03 LAB — CBC
HCT: 23.6 % — ABNORMAL LOW (ref 36.0–46.0)
Hemoglobin: 7.6 g/dL — ABNORMAL LOW (ref 12.0–15.0)
MCH: 27 pg (ref 26.0–34.0)
MCHC: 32.2 g/dL (ref 30.0–36.0)
MCV: 84 fL (ref 80.0–100.0)
Platelets: 197 10*3/uL (ref 150–400)
RBC: 2.81 MIL/uL — ABNORMAL LOW (ref 3.87–5.11)
RDW: 17.3 % — ABNORMAL HIGH (ref 11.5–15.5)
WBC: 5.7 10*3/uL (ref 4.0–10.5)
nRBC: 0 % (ref 0.0–0.2)

## 2022-08-03 MED ORDER — HYDROMORPHONE HCL 2 MG PO TABS
1.0000 mg | ORAL_TABLET | Freq: Three times a day (TID) | ORAL | Status: DC | PRN
  Administered 2022-08-03 – 2022-08-04 (×2): 1 mg via ORAL
  Filled 2022-08-03 (×2): qty 1

## 2022-08-03 MED ORDER — HYDROMORPHONE HCL 2 MG PO TABS: 1.0000 mg | ORAL_TABLET | Freq: Three times a day (TID) | ORAL | Status: DC | PRN

## 2022-08-03 MED ORDER — METHOCARBAMOL 500 MG PO TABS
500.0000 mg | ORAL_TABLET | Freq: Three times a day (TID) | ORAL | Status: DC | PRN
  Administered 2022-08-03: 500 mg via ORAL
  Filled 2022-08-03: qty 1

## 2022-08-03 NOTE — Progress Notes (Signed)
Received patient in bed to unit.  Alert and oriented.  Informed consent signed and in chart.   Treatment initiated: 1428 Treatment completed: 1728  Patient tolerated well.  Transported back to the room  Alert, without acute distress.  Hand-off given to patient's nurse.   Access used: AVF Access issues: No  Total UF removed: 2000 Medication(s) given: N/A Post HD VS: B/P-180/92, HR-78, R-30, T-99.0, o2-100%-2L Post HD weight: 77.1kgs   [facility]
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
## 2022-08-04 DIAGNOSIS — G894 Chronic pain syndrome: Secondary | ICD-10-CM | POA: Diagnosis not present

## 2022-08-04 DIAGNOSIS — I10 Essential (primary) hypertension: Secondary | ICD-10-CM | POA: Diagnosis not present

## 2022-08-04 DIAGNOSIS — J9601 Acute respiratory failure with hypoxia: Secondary | ICD-10-CM | POA: Diagnosis not present

## 2022-08-04 LAB — CBC
HCT: 25.9 % — ABNORMAL LOW (ref 36.0–46.0)
Hemoglobin: 8.1 g/dL — ABNORMAL LOW (ref 12.0–15.0)
MCH: 26.5 pg (ref 26.0–34.0)
MCHC: 31.3 g/dL (ref 30.0–36.0)
MCV: 84.6 fL (ref 80.0–100.0)
Platelets: 201 10*3/uL (ref 150–400)
RBC: 3.06 MIL/uL — ABNORMAL LOW (ref 3.87–5.11)
RDW: 17.2 % — ABNORMAL HIGH (ref 11.5–15.5)
WBC: 6.3 10*3/uL (ref 4.0–10.5)
nRBC: 0 % (ref 0.0–0.2)

## 2022-08-04 LAB — RENAL FUNCTION PANEL
Albumin: 3 g/dL — ABNORMAL LOW (ref 3.5–5.0)
Anion gap: 15 (ref 5–15)
BUN: 38 mg/dL — ABNORMAL HIGH (ref 8–23)
CO2: 27 mmol/L (ref 22–32)
Calcium: 8.4 mg/dL — ABNORMAL LOW (ref 8.9–10.3)
Chloride: 92 mmol/L — ABNORMAL LOW (ref 98–111)
Creatinine, Ser: 4.29 mg/dL — ABNORMAL HIGH (ref 0.44–1.00)
GFR, Estimated: 11 mL/min — ABNORMAL LOW (ref >60)
Glucose, Bld: 150 mg/dL — ABNORMAL HIGH (ref 70–99)
Phosphorus: 4.2 mg/dL (ref 2.5–4.6)
Potassium: 4.1 mmol/L (ref 3.5–5.1)
Sodium: 134 mmol/L — ABNORMAL LOW (ref 135–145)

## 2022-08-04 LAB — HEPATITIS B SURFACE ANTIBODY, QUANTITATIVE: Hep B S AB Quant (Post): 164.7 m[IU]/mL (ref >9.9)

## 2022-08-04 MED ORDER — METHOCARBAMOL 500 MG PO TABS
500.0000 mg | ORAL_TABLET | Freq: Four times a day (QID) | ORAL | Status: DC | PRN
  Administered 2022-08-04: 500 mg via ORAL
  Filled 2022-08-04: qty 1

## 2022-08-04 MED ORDER — METHOCARBAMOL 500 MG PO TABS: 500.0000 mg | ORAL_TABLET | Freq: Four times a day (QID) | ORAL | Status: DC | PRN

## 2022-08-04 MED ORDER — METHOCARBAMOL 500 MG PO TABS
500.0000 mg | ORAL_TABLET | Freq: Three times a day (TID) | ORAL | Status: DC
  Administered 2022-08-04 – 2022-08-11 (×20): 500 mg via ORAL
  Filled 2022-08-04 (×22): qty 1

## 2022-08-04 MED ORDER — TRAZODONE HCL 50 MG PO TABS: 50.0000 mg | ORAL_TABLET | Freq: Every evening | ORAL | Status: DC | PRN

## 2022-08-04 MED ORDER — HYDROMORPHONE HCL 2 MG PO TABS: 1.0000 mg | ORAL_TABLET | Freq: Two times a day (BID) | ORAL | Status: DC | PRN

## 2022-08-04 MED ORDER — CHLORHEXIDINE GLUCONATE CLOTH 2 % EX PADS
6.0000 | MEDICATED_PAD | Freq: Every day | CUTANEOUS | Status: DC
  Administered 2022-08-05 – 2022-08-06 (×3): 6 via TOPICAL

## 2022-08-04 MED ORDER — LOPERAMIDE HCL 2 MG PO CAPS
2.0000 mg | ORAL_CAPSULE | ORAL | Status: DC | PRN
  Administered 2022-08-04 – 2022-08-09 (×2): 2 mg via ORAL
  Filled 2022-08-04 (×2): qty 1

## 2022-08-04 MED ORDER — METHOCARBAMOL 500 MG PO TABS: 500.0000 mg | ORAL_TABLET | Freq: Three times a day (TID) | ORAL | Status: DC

## 2022-08-04 MED ORDER — MELATONIN 3 MG PO TABS
3.0000 mg | ORAL_TABLET | Freq: Every day | ORAL | Status: DC
  Administered 2022-08-04 – 2022-08-10 (×6): 3 mg via ORAL
  Filled 2022-08-04 (×7): qty 1

## 2022-08-04 MED ORDER — HYDROMORPHONE HCL 2 MG PO TABS
1.0000 mg | ORAL_TABLET | Freq: Two times a day (BID) | ORAL | Status: DC | PRN
  Administered 2022-08-04 – 2022-08-05 (×2): 1 mg via ORAL
  Filled 2022-08-04 (×2): qty 1

## 2022-08-04 MED ORDER — HYDRALAZINE HCL 50 MG PO TABS
50.0000 mg | ORAL_TABLET | Freq: Three times a day (TID) | ORAL | Status: DC
  Administered 2022-08-04 – 2022-08-08 (×9): 50 mg via ORAL
  Filled 2022-08-04 (×9): qty 1

## 2022-08-04 MED ORDER — GUAIFENESIN 100 MG/5ML PO LIQD
5.0000 mL | ORAL | Status: DC | PRN
  Administered 2022-08-04 – 2022-08-11 (×6): 5 mL via ORAL
  Filled 2022-08-04 (×6): qty 15
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
## 2022-08-04 NOTE — Progress Notes (Signed)
  [facility] Progress Note   Subjective: seen in room, pleasant, no c/o's.   Objective Vitals:   08/03/22 2040 08/04/22 0506 08/04/22 0512 08/04/22 0729  BP: (!) 166/83 (!) 162/86  (!) 153/66  Pulse: 84 83  85  Resp: 18 17  15   Temp: 98.4 F (36.9 C) 98.8 F (37.1 C)  98.4 F (36.9 C)  TempSrc:    Oral  SpO2: 96% 98%  96%  Weight:   77 kg    Physical Exam General:Chronically ill appearing female in NAD Heart: S1,S2 RRR Lungs: CTAB  Abdomen: NABS, NT Extremities: 1+ pretib edema Dialysis Access: L AVF +t/b  HD orders: AF TTS 4 hrs 400/600  78.6kg   400/600  2K/3.0Ca bath  L AVF  Hep none - Mircera 150 MCG IV q 2 weeks (last 07/13/2022) - Venofer 100 mg IV X 10 doses (2/10 doses given)  Assessment/Plan: Dyspnea/Volume overload - better after HD x 1 here (4L off). Needs lower EDW.  Possible PNA, on empiric ABX.  ESRD -  HD TTS. Had HD here Monday and Tuesday. Next HD tomorrow.  Access - per notes AVF has been difficult to cannulate. Has been referred to [facility] for f'gram. No issues with AVF today.  Hypertension/volume - BP ok. Continue home meds. Has been leaving under OP EDW however wt not adjusted. Lower EDW while here.  Anemia  - Hb 7.8. On ESA/IV Fe as outpatient. Due for ESA - will order. Continue Fe load when ABX completed.  Metabolic bone disease -  Corr Ca ok. Check phos with HD  Nutrition - Renal diet with fluid restriction  HFrEF 25-30% - manage volume with HD Dispo - per pmd, stable for dc from renal standpoint  Kelly Splinter, MD 08/04/2022, 2:24 PM  Recent Labs  Lab 08/03/22 0307 08/04/22 0542  HGB 7.6* 8.1*  ALBUMIN 2.7* 3.0*  CALCIUM 8.0* 8.4*  PHOS 4.7* 4.2  CREATININE 5.25* 4.29*  K 4.3 4.1     Inpatient medications:  amiodarone  200 mg Oral Daily   apixaban  5 mg Oral BID   atorvastatin  20 mg Oral Daily   carvedilol  6.25 mg Oral BID   Chlorhexidine Gluconate Cloth  6 each Topical Q0600   darbepoetin (ARANESP) injection -  DIALYSIS  150 mcg Subcutaneous Q Mon-1800   hydrALAZINE  50 mg Oral Q8H   isosorbide dinitrate  20 mg Oral TID   lidocaine  1 patch Transdermal Q24H   losartan  50 mg Oral Daily   melatonin  3 mg Oral QHS   methocarbamol  500 mg Oral TID   mirtazapine  7.5 mg Oral QHS   senna  1 tablet Oral BID    HYDROmorphone, ipratropium-albuterol, traZODone
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
## 2022-08-04 NOTE — TOC Progression Note (Addendum)
Transition of Care [facility]) - Progression Note    Patient Details  Name: Audrey Hill MRN: [id number] Date of Birth: 04-24-1956  Transition of Care [facility]) CM/SW [place], RN Phone Number: 08/04/2022, 12:12 PM  Clinical Narrative:    CM met with the patient at the bedside to discuss patient's progress to reach out to family to help with overdue utility bill.  I spoke with the patient and she states that she has home oxygen at her home that she has not used in a while and that she does not want to ask family to pay for her bill.  I asked the patient about SNF/ALF placement and she refuses.  I explained to the patient that she will need to make plans to pay her bill, ask for family to assist to pay utility bill or make plans to stay with family or friends.  The patient states that she's not sure what she will do or where she will stay and has not made efforts to call to make arrangements at this time.  08/04/2022 1226- CM called [employer] to find out about utility assistance but was unable to leave a [employer].  APS plans to follow  up with the patient to check on her progress as well.  08/04/2022 1420 - I spoke with Dr. Raymondo Band and the patient is currently being weaned off her oxygen.  The patient has a rolling walker at her house and plans to return to her apartment listed on [facility].  The patient states that she has keys to her apartment.  I asked how the patient plans to find transportation to her dialysis appointments and she states that she "can't worry about that right now".  I called and spoke with Melven Sartorius, Renal Navigator and she will follow up at [facility] SW to determine patient's transportation and determine how I can assist the patient.  CM gave the patient shelter resources and Medicaid transportation was also included in the discharge instructions.  I spoke with Melven Sartorius, renal navigator and she confirmed that prior to admission,  the patient was staying at [facility] and she was providing transportation.  I plan to call Medicaid transportation and set patient up with Medicaid transportation to dialysis center on Tuesday, Thursday, and Saturday - chair time at 1130.  CM will continue to follow the patient for [facility] needs for home.   Expected Discharge Plan: [place] Barriers to Discharge: Continued Medical Work up  Expected Discharge Plan and Services Expected Discharge Plan: [place]   Discharge Planning Services: CM Consult   Living arrangements for the past 2 months: Apartment                                       Social Determinants of Health (SDOH) Interventions    Readmission Risk Interventions    08/02/2022    3:40 PM  Readmission Risk Prevention Plan  Transportation Screening Complete  PCP or Specialist Appt within 5-7 Days Complete  Home Care Screening Complete  Medication Review (RN CM) Complete

## 2022-08-05 DIAGNOSIS — I509 Heart failure, unspecified: Secondary | ICD-10-CM

## 2022-08-05 DIAGNOSIS — Z992 Dependence on renal dialysis: Secondary | ICD-10-CM | POA: Diagnosis not present

## 2022-08-05 DIAGNOSIS — I130 Hypertensive heart and chronic kidney disease with heart failure and stage 1 through stage 4 chronic kidney disease, or unspecified chronic kidney disease: Secondary | ICD-10-CM

## 2022-08-05 DIAGNOSIS — N186 End stage renal disease: Secondary | ICD-10-CM | POA: Diagnosis not present

## 2022-08-05 DIAGNOSIS — F329 Major depressive disorder, single episode, unspecified: Secondary | ICD-10-CM | POA: Insufficient documentation

## 2022-08-05 DIAGNOSIS — G894 Chronic pain syndrome: Secondary | ICD-10-CM | POA: Diagnosis not present

## 2022-08-05 DIAGNOSIS — J9601 Acute respiratory failure with hypoxia: Secondary | ICD-10-CM | POA: Diagnosis not present

## 2022-08-05 DIAGNOSIS — Z87891 Personal history of nicotine dependence: Secondary | ICD-10-CM

## 2022-08-05 LAB — BASIC METABOLIC PANEL
Anion gap: 13 (ref 5–15)
BUN: 49 mg/dL — ABNORMAL HIGH (ref 8–23)
CO2: 25 mmol/L (ref 22–32)
Calcium: 8.6 mg/dL — ABNORMAL LOW (ref 8.9–10.3)
Chloride: 97 mmol/L — ABNORMAL LOW (ref 98–111)
Creatinine, Ser: 5.39 mg/dL — ABNORMAL HIGH (ref 0.44–1.00)
GFR, Estimated: 8 mL/min — ABNORMAL LOW (ref >60)
Glucose, Bld: 79 mg/dL (ref 70–99)
Potassium: 4.4 mmol/L (ref 3.5–5.1)
Sodium: 135 mmol/L (ref 135–145)

## 2022-08-05 LAB — LIPID PANEL
Cholesterol: 113 mg/dL (ref 0–200)
HDL: 72 mg/dL (ref >40)
LDL Cholesterol: 34 mg/dL (ref 0–99)
Total CHOL/HDL Ratio: 1.6 RATIO
Triglycerides: 33 mg/dL (ref <150)
VLDL: 7 mg/dL (ref 0–40)

## 2022-08-05 LAB — CBC
HCT: 24.9 % — ABNORMAL LOW (ref 36.0–46.0)
Hemoglobin: 8.1 g/dL — ABNORMAL LOW (ref 12.0–15.0)
MCH: 27 pg (ref 26.0–34.0)
MCHC: 32.5 g/dL (ref 30.0–36.0)
MCV: 83 fL (ref 80.0–100.0)
Platelets: 191 10*3/uL (ref 150–400)
RBC: 3 MIL/uL — ABNORMAL LOW (ref 3.87–5.11)
RDW: 17.2 % — ABNORMAL HIGH (ref 11.5–15.5)
WBC: 6.2 10*3/uL (ref 4.0–10.5)
nRBC: 0 % (ref 0.0–0.2)

## 2022-08-05 MED ORDER — HYDROMORPHONE HCL 2 MG PO TABS: 1.0000 mg | ORAL_TABLET | Freq: Once | ORAL | Status: AC | PRN

## 2022-08-05 MED ORDER — TRAZODONE HCL 50 MG PO TABS
50.0000 mg | ORAL_TABLET | Freq: Every day | ORAL | Status: DC
  Administered 2022-08-05 – 2022-08-10 (×6): 50 mg via ORAL
  Filled 2022-08-05 (×6): qty 1

## 2022-08-05 MED ORDER — QUETIAPINE FUMARATE 25 MG PO TABS
50.0000 mg | ORAL_TABLET | Freq: Every day | ORAL | Status: DC
  Administered 2022-08-05 – 2022-08-07 (×3): 50 mg via ORAL
  Filled 2022-08-05 (×3): qty 2

## 2022-08-05 MED ORDER — LIDOCAINE 5 % EX PTCH: 1.0000 | MEDICATED_PATCH | Freq: Every day | CUTANEOUS | Status: DC | PRN

## 2022-08-05 MED ORDER — HYDROMORPHONE HCL 2 MG PO TABS
1.0000 mg | ORAL_TABLET | Freq: Once | ORAL | Status: AC | PRN
  Administered 2022-08-06: 1 mg via ORAL
  Filled 2022-08-05: qty 1

## 2022-08-05 NOTE — Progress Notes (Signed)
Mobility Specialist Progress Note   08/05/22 1546  Mobility  Activity Off unit   Patient is off the floor at this time. Will f/u as time permits.  [place] Wyett Narine, [place], [place]  Office: [phone]

## 2022-08-05 NOTE — Progress Notes (Signed)
PT Cancellation Note  Patient Details Name: Audrey Hill MRN: [id number] DOB: Sep 10, 1956   Cancelled Treatment:    Reason Eval/Treat Not Completed: Patient declined, states she is tired from walking with mobility team a little earlier this morning. May feel like working with PT later. Will follow-up as time allows.   Ellouise Newer 08/05/2022, 11:59 AM

## 2022-08-05 NOTE — Progress Notes (Signed)
  [facility] Progress Note   Subjective: seen in room, pleasant, no c/o's.   Objective Vitals:   08/04/22 1910 08/05/22 0348 08/05/22 0500 08/05/22 0809  BP: (!) 171/85 (!) 157/86  (!) 154/81  Pulse: 84 83  85  Resp: 17 15  15   Temp: 98.9 F (37.2 C) 98.9 F (37.2 C)  98.6 F (37 C)  TempSrc:    Oral  SpO2: 99% 100%  92%  Weight:   76.3 kg    Physical Exam General:Chronically ill appearing female in NAD Heart: S1,S2 RRR Lungs: CTAB  Abdomen: NABS, NT Extremities: 1+ pretib edema Dialysis Access: L AVF +t/b  HD orders: AF TTS 4 hrs 400/600  78.6kg   400/600  2K/3.0Ca bath  L AVF  Hep none - Mircera 150 MCG IV q 2 weeks (last 07/13/2022) - Venofer 100 mg IV X 10 doses (2/10 doses given)  Assessment/Plan: Dyspnea/Volume overload - better after HD x 1 here (4L off). Needs lower EDW.  Possible PNA, on empiric ABX.  ESRD -  HD TTS. Had HD here Monday and Tuesday. Next HD today.  Access - per notes AVF has been difficult to cannulate. Has been referred to [facility] for f'gram. No issues with AVF here.  Volume - has been leaving under OP EDW however wt not adjusted. 4L and 2L UF so far w/ HD here. 2kg under dry wt today. No excess vol by exam. UF 2 L goal w/ HD as tol.  Anemia  - Hb 7.8. On ESA/IV Fe as outpatient. Due for ESA - will order. Continue Fe load when ABX completed.  Metabolic bone disease -  Corr Ca ok. Check phos with HD  Nutrition - Renal diet with fluid restriction  HFrEF 25-30% - manage volume with HD Dispo - per pmd, stable for dc from renal standpoint  Kelly Splinter, MD 08/05/2022, 11:33 AM  Recent Labs  Lab 08/03/22 0307 08/04/22 0542 08/05/22 0431  HGB 7.6* 8.1* 8.1*  ALBUMIN 2.7* 3.0*  --   CALCIUM 8.0* 8.4* 8.6*  PHOS 4.7* 4.2  --   CREATININE 5.25* 4.29* 5.39*  K 4.3 4.1 4.4     Inpatient medications:  amiodarone  200 mg Oral Daily   apixaban  5 mg Oral BID   atorvastatin  20 mg Oral Daily   carvedilol  6.25 mg Oral BID    Chlorhexidine Gluconate Cloth  6 each Topical Q0600   Chlorhexidine Gluconate Cloth  6 each Topical Q0600   darbepoetin (ARANESP) injection - DIALYSIS  150 mcg Subcutaneous Q Mon-1800   hydrALAZINE  50 mg Oral Q8H   isosorbide dinitrate  20 mg Oral TID   losartan  50 mg Oral Daily   melatonin  3 mg Oral QHS   methocarbamol  500 mg Oral TID   mirtazapine  7.5 mg Oral QHS   senna  1 tablet Oral BID   traZODone  50 mg Oral QHS    guaiFENesin, HYDROmorphone, ipratropium-albuterol, lidocaine, loperamide
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
## 2022-08-05 NOTE — Care Management Important Message (Signed)
Important Message  Patient Details  Name: Audrey Hill MRN: [id number] Date of Birth: 1956/02/18   Medicare Important Message Given:  Yes     Orbie Pyo 08/05/2022, 2:04 PM

## 2022-08-05 NOTE — Progress Notes (Signed)
Mobility Specialist Progress Note   08/05/22 1030  Mobility  Activity Ambulated with assistance in hallway;Dangled on edge of bed  Level of Assistance Contact guard assist, steadying assist  Assistive Device Front wheel walker  Distance Ambulated (ft) 250 ft  Range of Motion/Exercises Active;All extremities  Activity Response Tolerated well   Patient received dangling EOB, visibly despondent/agitated and verbalizing minimally. Agreeable to participate in session despite complaints of 7/10 pain in right hip/LE. Ambulated min guard with slow but steady antalgic gait favoring her RLE. Denied pain is increased with ambulation. Requires cues to maintain safe distance from RW as patient has tendency to walk too proximal to RW. Returned to room without complaint or incident. Was left dangling EOB with all needs met, call bell in reach.   Audrey Hill, [place], [place]  Office: [phone]
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
## 2022-08-05 NOTE — Progress Notes (Signed)
PT Cancellation Note  Patient Details Name: ZEPPELIN BECKSTRAND MRN: [id number] DOB: 1955/10/06   Cancelled Treatment:    Reason Eval/Treat Not Completed: Patient at procedure or test/unavailable  Still at dialysis. Will attempt to see tomorrow morning for follow-up. Will check O2 sats while ambulating.   Ellouise Newer 08/05/2022, 4:17 PM

## 2022-08-06 DIAGNOSIS — J9601 Acute respiratory failure with hypoxia: Secondary | ICD-10-CM | POA: Diagnosis not present

## 2022-08-06 DIAGNOSIS — G894 Chronic pain syndrome: Secondary | ICD-10-CM | POA: Diagnosis not present

## 2022-08-06 DIAGNOSIS — N186 End stage renal disease: Secondary | ICD-10-CM | POA: Diagnosis not present

## 2022-08-06 DIAGNOSIS — J449 Chronic obstructive pulmonary disease, unspecified: Secondary | ICD-10-CM

## 2022-08-06 LAB — BASIC METABOLIC PANEL
Anion gap: 11 (ref 5–15)
BUN: 31 mg/dL — ABNORMAL HIGH (ref 8–23)
CO2: 28 mmol/L (ref 22–32)
Calcium: 8.5 mg/dL — ABNORMAL LOW (ref 8.9–10.3)
Chloride: 95 mmol/L — ABNORMAL LOW (ref 98–111)
Creatinine, Ser: 3.95 mg/dL — ABNORMAL HIGH (ref 0.44–1.00)
GFR, Estimated: 12 mL/min — ABNORMAL LOW (ref >60)
Glucose, Bld: 111 mg/dL — ABNORMAL HIGH (ref 70–99)
Potassium: 3.8 mmol/L (ref 3.5–5.1)
Sodium: 134 mmol/L — ABNORMAL LOW (ref 135–145)

## 2022-08-06 LAB — CBC
HCT: 27.4 % — ABNORMAL LOW (ref 36.0–46.0)
Hemoglobin: 8.6 g/dL — ABNORMAL LOW (ref 12.0–15.0)
MCH: 26.5 pg (ref 26.0–34.0)
MCHC: 31.4 g/dL (ref 30.0–36.0)
MCV: 84.3 fL (ref 80.0–100.0)
Platelets: 206 10*3/uL (ref 150–400)
RBC: 3.25 MIL/uL — ABNORMAL LOW (ref 3.87–5.11)
RDW: 17.2 % — ABNORMAL HIGH (ref 11.5–15.5)
WBC: 6 10*3/uL (ref 4.0–10.5)
nRBC: 0 % (ref 0.0–0.2)

## 2022-08-06 LAB — CULTURE, BLOOD (ROUTINE X 2)
Culture: NO GROWTH
Special Requests: ADEQUATE

## 2022-08-06 MED ORDER — CHLORHEXIDINE GLUCONATE CLOTH 2 % EX PADS
6.0000 | MEDICATED_PAD | Freq: Every day | CUTANEOUS | Status: DC
  Administered 2022-08-06 – 2022-08-11 (×6): 6 via TOPICAL

## 2022-08-06 MED ORDER — LOSARTAN POTASSIUM 50 MG PO TABS
100.0000 mg | ORAL_TABLET | Freq: Every day | ORAL | Status: DC
  Administered 2022-08-06 – 2022-08-11 (×6): 100 mg via ORAL
  Filled 2022-08-06 (×6): qty 2
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
## 2022-08-06 NOTE — Progress Notes (Signed)
Mobility Specialist Progress Note:   08/06/22 1141  Mobility  Activity Refused mobility   Pt refused mobility d/t recent walk with PT. Will f/u as schedule allows.   Audrey Hill Mobility Specialist-Acute Rehab Secure Chat only

## 2022-08-06 NOTE — Progress Notes (Signed)
  [facility] Progress Note   Subjective: seen in room, pleasant. No c/o's.   Objective Vitals:   08/05/22 2124 08/05/22 2205 08/06/22 0441 08/06/22 0810  BP: (!) 182/92  (!) 143/71 (!) 174/82  Pulse: 98 68 88 92  Resp: 18  18 15   Temp: 97.6 F (36.4 C)  98.6 F (37 C) 98.6 F (37 C)  TempSrc: Oral     SpO2: 98% 93% 95% 95%  Weight:       Physical Exam General:Chronically ill appearing female in NAD Heart: S1,S2 RRR Lungs: CTAB  Abdomen: NABS, NT Extremities: 1+ pretib edema Dialysis Access: L AVF +t/b  HD orders: AF TTS 4 hrs 400/600  78.6kg   400/600  2K/3.0Ca bath  L AVF  Hep none - Mircera 150 MCG IV q 2 weeks (last 07/13/2022) - Venofer 100 mg IV X 10 doses (2/10 doses given)  Assessment/Plan: Dyspnea/Volume overload - resolved. Needs lower EDW, will ask for standing wt this am.  Possible PNA, on empiric ABX.  ESRD -  HD TTS. Had extra HD Monday, back on TTS schedule. Next HD Sat.  Access - per notes AVF has been difficult to cannulate. Has been referred to [facility] for f'gram. No issues with AVF here.  Volume - has been leaving under OP EDW however wt not adjusted. 8-9 L UF w/ HD x 3 this week. Get new standing wt this am, lower dry wt upon dc.  Anemia  - Hb 7.8. On ESA/IV Fe as outpatient. Due for ESA - will order. Continue Fe load when ABX completed.  Metabolic bone disease -  Corr Ca ok. Check phos with HD  Nutrition - Renal diet with fluid restriction  HFrEF 25-30% - manage volume with HD Dispo - concerns about home situation, transport to HD, etc.. SW team working on this.   Kelly Splinter, MD 08/06/2022, 11:07 AM  Recent Labs  Lab 08/03/22 0307 08/04/22 0542 08/05/22 0431 08/06/22 0300  HGB 7.6* 8.1* 8.1* 8.6*  ALBUMIN 2.7* 3.0*  --   --   CALCIUM 8.0* 8.4* 8.6* 8.5*  PHOS 4.7* 4.2  --   --   CREATININE 5.25* 4.29* 5.39* 3.95*  K 4.3 4.1 4.4 3.8     Inpatient medications:  amiodarone  200 mg Oral Daily   apixaban  5 mg Oral BID    atorvastatin  20 mg Oral Daily   carvedilol  6.25 mg Oral BID   Chlorhexidine Gluconate Cloth  6 each Topical Q0600   Chlorhexidine Gluconate Cloth  6 each Topical Q0600   darbepoetin (ARANESP) injection - DIALYSIS  150 mcg Subcutaneous Q Mon-1800   hydrALAZINE  50 mg Oral Q8H   isosorbide dinitrate  20 mg Oral TID   losartan  100 mg Oral Daily   melatonin  3 mg Oral QHS   methocarbamol  500 mg Oral TID   mirtazapine  7.5 mg Oral QHS   QUEtiapine  50 mg Oral QHS   senna  1 tablet Oral BID   traZODone  50 mg Oral QHS    guaiFENesin, [EXPIRED] HYDROmorphone **FOLLOWED BY** HYDROmorphone, ipratropium-albuterol, lidocaine, loperamide

## 2022-08-06 NOTE — Progress Notes (Signed)
Mobility Specialist Progress Note:   08/06/22 1400  Mobility  Activity Dangled on edge of bed  Level of Assistance Independent  Assistive Device None  Activity Response Tolerated fair  Mobility Referral Yes  $Mobility charge 1 Mobility   Pt received in bed and agreeable after encouragement. Dangled EOB independently. Pt declined further mobility d/t previous walks and pain in right hip/groin area. Pt left sitting EOB with all needs met and call bell in reach.   Ramces Shomaker Mobility Specialist-Acute Rehab Secure Chat only

## 2022-08-06 NOTE — Plan of Care (Signed)
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
## 2022-08-06 NOTE — Progress Notes (Signed)
Physical Therapy Treatment Patient Details Name: Audrey Hill MRN: [id number] DOB: 19-Jan-1956 Today's Date: 08/06/2022   History of Present Illness 66 yo F admitted 10/28 with SOB, CP after short HD due to fistual malfunctioning. 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 180 feet on room air with RW, SPO2 96%, mild DOE, one standing rest break. Encouraged frequent supervisised OOB mobility during admission. Patient will continue to benefit from skilled physical therapy services to further improve independence with functional mobility.    Recommendations for follow up therapy are one component of a multi-disciplinary discharge planning process, led by the attending physician.  Recommendations may be updated based on patient status, additional functional criteria and insurance authorization.  Follow Up Recommendations  Home health PT     Assistance Recommended at Discharge PRN  Patient can return home with the following Assistance with cooking/housework;Assist for transportation   Equipment Recommendations  None recommended by PT    Recommendations for Other Services       Precautions / Restrictions Precautions Precautions: Fall Restrictions Weight Bearing Restrictions: No     Mobility  Bed Mobility Overal bed mobility: Modified Independent                  Transfers Overall transfer level: Modified independent                      Ambulation/Gait Ambulation/Gait assistance: Supervision Gait Distance (Feet): 180 Feet Assistive device: Rolling walker (2 wheels) Gait Pattern/deviations: Step-through pattern, Decreased stride length, Trunk flexed Gait velocity: decreased Gait velocity interpretation: <1.8 ft/sec, indicate of risk for recurrent falls   General Gait Details: Cues for upright posture  and walker placement, declines adjustment for appropriate height, she likes it high. SpO2 96% on room air throughout bout. Mild DOE, required one standing rest break to complete distance.   Stairs             Wheelchair Mobility    Modified Rankin (Stroke Patients Only)       Balance Overall balance assessment: Mild deficits observed, not formally tested                                          Cognition Arousal/Alertness: Awake/alert Behavior During Therapy: Flat affect Overall Cognitive Status: Within Functional Limits for tasks assessed                                          Exercises      General Comments        Pertinent Vitals/Pain Pain Assessment Pain Assessment: Faces Faces Pain Scale: Hurts little more Pain Location: "all over" Pain Descriptors / Indicators: Aching Pain Intervention(s): Monitored during session    Home Living                          Prior Function            PT Goals (current goals can now be found in the care plan section) Acute Rehab PT Goals Patient Stated Goal: return home PT Goal Formulation: With patient Time  For Goal Achievement: 08/16/22 Potential to Achieve Goals: Fair Progress towards PT goals: Progressing toward goals    Frequency    Min 2X/week      PT Plan Current plan remains appropriate    Co-evaluation              AM-PAC PT "6 Clicks" Mobility   Outcome Measure  Help needed turning from your back to your side while in a flat bed without using bedrails?: None Help needed moving from lying on your back to sitting on the side of a flat bed without using bedrails?: None Help needed moving to and from a bed to a chair (including a wheelchair)?: None Help needed standing up from a chair using your arms (e.g., wheelchair or bedside chair)?: A Little Help needed to walk in hospital room?: A Little Help needed climbing 3-5 steps with a railing? : A  Little 6 Click Score: 21    End of Session Equipment Utilized During Treatment: Gait belt Activity Tolerance: Patient tolerated treatment well Patient left: in bed;with call bell/phone within reach;with bed alarm set   PT Visit Diagnosis: Other abnormalities of gait and mobility (R26.89);Difficulty in walking, not elsewhere classified (R26.2)     Time: 3383-2919 PT Time Calculation (min) (ACUTE ONLY): 19 min  Charges:  $Gait Training: 8-22 mins                     Candie Mile, PT, DPT Physical Therapist Acute Rehabilitation Services [place] 08/06/2022, 9:22 AM

## 2022-08-07 DIAGNOSIS — I5020 Unspecified systolic (congestive) heart failure: Secondary | ICD-10-CM | POA: Diagnosis not present

## 2022-08-07 DIAGNOSIS — J9601 Acute respiratory failure with hypoxia: Secondary | ICD-10-CM | POA: Diagnosis not present

## 2022-08-07 DIAGNOSIS — I132 Hypertensive heart and chronic kidney disease with heart failure and with stage 5 chronic kidney disease, or end stage renal disease: Secondary | ICD-10-CM | POA: Diagnosis not present

## 2022-08-07 DIAGNOSIS — N186 End stage renal disease: Secondary | ICD-10-CM | POA: Diagnosis not present

## 2022-08-07 LAB — CBC
HCT: 25 % — ABNORMAL LOW (ref 36.0–46.0)
Hemoglobin: 7.9 g/dL — ABNORMAL LOW (ref 12.0–15.0)
MCH: 26.4 pg (ref 26.0–34.0)
MCHC: 31.6 g/dL (ref 30.0–36.0)
MCV: 83.6 fL (ref 80.0–100.0)
Platelets: 201 10*3/uL (ref 150–400)
RBC: 2.99 MIL/uL — ABNORMAL LOW (ref 3.87–5.11)
RDW: 17.3 % — ABNORMAL HIGH (ref 11.5–15.5)
WBC: 5.4 10*3/uL (ref 4.0–10.5)
nRBC: 0 % (ref 0.0–0.2)

## 2022-08-07 LAB — BASIC METABOLIC PANEL
Anion gap: 8 (ref 5–15)
BUN: 48 mg/dL — ABNORMAL HIGH (ref 8–23)
CO2: 26 mmol/L (ref 22–32)
Calcium: 8.6 mg/dL — ABNORMAL LOW (ref 8.9–10.3)
Chloride: 100 mmol/L (ref 98–111)
Creatinine, Ser: 5.6 mg/dL — ABNORMAL HIGH (ref 0.44–1.00)
GFR, Estimated: 8 mL/min — ABNORMAL LOW (ref >60)
Glucose, Bld: 78 mg/dL (ref 70–99)
Potassium: 4.3 mmol/L (ref 3.5–5.1)
Sodium: 134 mmol/L — ABNORMAL LOW (ref 135–145)

## 2022-08-07 LAB — CULTURE, BLOOD (ROUTINE X 2)
Culture: NO GROWTH
Special Requests: ADEQUATE

## 2022-08-07 MED ORDER — DICLOFENAC SODIUM 1 % EX GEL
2.0000 g | Freq: Two times a day (BID) | CUTANEOUS | Status: DC | PRN
  Administered 2022-08-08: 2 g via TOPICAL
  Filled 2022-08-07: qty 100

## 2022-08-07 MED ORDER — CARVEDILOL 12.5 MG PO TABS
12.5000 mg | ORAL_TABLET | Freq: Two times a day (BID) | ORAL | Status: DC
  Administered 2022-08-07 – 2022-08-11 (×8): 12.5 mg via ORAL
  Filled 2022-08-07 (×8): qty 1

## 2022-08-07 NOTE — Progress Notes (Signed)
  [facility] Progress Note   Subjective: seen in room, pleasant. No c/o's.   Objective Vitals:   08/07/22 1130 08/07/22 1200 08/07/22 1209 08/07/22 1229  BP: (!) 156/91 (!) 173/99  (!) 169/97  Pulse: 83 85  87  Resp: (!) 23 16  16   Temp:  98.3 F (36.8 C)  98.5 F (36.9 C)  TempSrc:  Oral  Oral  SpO2: 98% 98%  96%  Weight:   76.1 kg    Physical Exam General:Chronically ill appearing female in NAD Heart: S1,S2 RRR Lungs: CTAB  Abdomen: NABS, NT Extremities: 1+ pretib edema Dialysis Access: L AVF +t/b  HD orders: AF TTS 4 hrs 400/600  78.6kg   400/600  2K/3.0Ca bath  L AVF  Hep none - Mircera 150 MCG IV q 2 weeks (last 07/13/2022) - Venofer 100 mg IV X 10 doses (2/10 doses given)  Assessment/Plan: Dyspnea/Volume overload - Possible PNA +/- vol excess. SP abx x 3 days. Vol off w/ HD, 2kg under now.  ESRD -  HD TTS. Had extra HD Monday, back on TTS schedule. HD today.  Access - per notes AVF has been difficult to cannulate. Has been referred to [facility] for f'gram. No issues with AVF here.  Volume - euvolemic now and 2.5 kg under dry wt. Lower dry wt at dc.  Anemia  - Hb 7.8. On ESA/IV Fe as outpatient. Due for ESA - will order. Continue Fe load when ABX completed.  Metabolic bone disease -  Corr Ca ok. Check phos with HD  Nutrition - Renal diet with fluid restriction  HFrEF 25-30% - manage volume with HD Dispo - concerns about home situation, transport to HD, electricity. SW working on it.   Kelly Splinter, MD 08/07/2022, 1:33 PM  Recent Labs  Lab 08/03/22 0307 08/04/22 0542 08/05/22 0431 08/06/22 0300 08/07/22 0342  HGB 7.6* 8.1*   < > 8.6* 7.9*  ALBUMIN 2.7* 3.0*  --   --   --   CALCIUM 8.0* 8.4*   < > 8.5* 8.6*  PHOS 4.7* 4.2  --   --   --   CREATININE 5.25* 4.29*   < > 3.95* 5.60*  K 4.3 4.1   < > 3.8 4.3   < > = values in this interval not displayed.     Inpatient medications:  amiodarone  200 mg Oral Daily   apixaban  5 mg Oral BID    atorvastatin  20 mg Oral Daily   carvedilol  12.5 mg Oral BID   Chlorhexidine Gluconate Cloth  6 each Topical Q0600   darbepoetin (ARANESP) injection - DIALYSIS  150 mcg Subcutaneous Q Mon-1800   hydrALAZINE  50 mg Oral Q8H   isosorbide dinitrate  20 mg Oral TID   losartan  100 mg Oral Daily   melatonin  3 mg Oral QHS   methocarbamol  500 mg Oral TID   mirtazapine  7.5 mg Oral QHS   QUEtiapine  50 mg Oral QHS   senna  1 tablet Oral BID   traZODone  50 mg Oral QHS    diclofenac Sodium, guaiFENesin, ipratropium-albuterol, lidocaine, loperamide

## 2022-08-07 NOTE — Progress Notes (Signed)
HD#5 SUBJECTIVE:  Patient Summary: Audrey Hill is a 66 y.o. 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   Overnight Events: No acute events overnight  Interim History: Patient seen and evaluated at HD this morning. She continues to endorse chest wall pain that radiates down her right leg. The pain now goes up towards her left neck, as well. Otherwise, her breathing remains stable.   OBJECTIVE:  Vital Signs: Vitals:   08/06/22 0441 08/06/22 0810 08/06/22 1927 08/07/22 0515  BP: (!) 143/71 (!) 174/82 (!) 144/82 (!) 145/79  Pulse: 88 92 88 79  Resp: 18 15 18 17   Temp: 98.6 F (37 C) 98.6 F (37 C) 98.6 F (37 C) 98.8 F (37.1 C)  TempSrc:      SpO2: 95% 95% 97% 92%  Weight:       Supplemental O2: Room Air SpO2: 92 %  Filed Weights   08/03/22 1739 08/04/22 0512 08/05/22 0500  Weight: 77.1 kg 77 kg 76.3 kg     Intake/Output Summary (Last 24 hours) at 08/07/2022 0616 Last data filed at 08/06/2022 1242 Gross per 24 hour  Intake 355 ml  Output --  Net 355 ml   Net IO Since Admission: -6,783.33 mL [08/07/22 0616]  Physical Exam: General:chronically ill appearing female laying in bed. No acute distress. CV: RRR. No murmurs. No LE edema Pulmonary: Lungs CTAB. Normal work of breathing on room air.  Abdominal: Soft, nontender, nondistended.  Extremities: Normal bulk and tone. Normal ROM. Skin: Warm and dry. Neuro: A&Ox3. Moves all extremities. No focal deficit. Psych: Normal mood and affect     ASSESSMENT/PLAN:  Assessment: Principal Problem:   Acute hypoxemic respiratory failure (HCC) Active Problems:   Hypertension   Atrial fibrillation (HCC)   ESRD (end stage renal disease) on dialysis (HCC)   Anemia of chronic disease   Chronic pain syndrome   Need for follow-up by social worker   Acute congestive heart  failure ([place])   MDD (major depressive disorder)   Plan: #Acute hypoxemic respiratory failure, improved #HFrEF exacerbation #ESRD on HD TThS Acute hypoxemic respiratory failure was initially secondary to incomplete HD sessions and is now improved. Patient remains afebrile, is not tachycardic, is not tachypneic, and is not hypoxemic.  She continues to sat above 90% on room air and remains medically stable for discharge.  - HD today, continue to push her EDW - No indications for antibiotics still  #Acute on chronic pain Patient has a history of chronic pain, prior cocaine abuse, and multiple drug allergies.  She was initially given as needed Dilaudid given her acute trauma (fall), however this was tapered down and ended yesterday.  The patient endorses a worsening of her pain since being stopped off of Dilaudid. Her chest wall pain now radiates to her neck, and continues to radiate down her right leg.  - Robaxin 500 mg tid  - Lidocaine patch prn  - Voltaren gel prn - Avoid NSAIDs - Will obtain EKG after HD today   #Hypertension Blood pressure remains elevated with systolic in the 536U. - Increase Coreg to 12.5 mg twice daily - Losartan 100 mg daily - Hydralazine 50 mg q8h - Isordil 20 mg 3 times daily  #Afib - Amiodarone 200 mg daily - Increased coreg as above - Eliquis 5 mg bid  #COPD Patient has not required any  supplemental oxygen in a couple of days. - Maintain SpO2 88-92% - DuoNebs prn - Robitussin prn   #Insomnia - Trazodone 50 mg qhs - Melatonin 3 mg qhs - Seroquel 50 mg qhs   Best Practice: Diet: Renal diet IVF: Fluids: none VTE: Eliquis Code: Full AB: none Therapy Recs: Home Health, DME: none Family Contact: none per APS case and patient preference DISPO: Anticipated discharge to Home pending  utilities/electricity in home .  Signature: Buddy Duty, D.O.  Internal Medicine Resident, PGY-2 [facility]  Pager:  [phone] 6:16 AM, 08/07/2022   Please contact the on call pager after 5 pm and on weekends at [phone].

## 2022-08-07 NOTE — Progress Notes (Signed)
Received patient in bed to unit.  Alert and oriented.  Informed consent signed and in chart.   Treatment initiated: 0815 Treatment completed: 0370  Patient tolerated well.  Transported back to the room  Alert, without acute distress.  Hand-off given to patient's nurse.   Access used: LUA AVF Access issues: No  Total UF removed: 2000 Medication(s) given: N/A Post HD VS: B/P-173/99,HR-84, R-28, O2-98% RA, T-98.3 Post HD weight: 76.1kgs   [facility]

## 2022-08-08 LAB — CBC
HCT: 23.9 % — ABNORMAL LOW (ref 36.0–46.0)
Hemoglobin: 7.7 g/dL — ABNORMAL LOW (ref 12.0–15.0)
MCH: 27 pg (ref 26.0–34.0)
MCHC: 32.2 g/dL (ref 30.0–36.0)
MCV: 83.9 fL (ref 80.0–100.0)
Platelets: 198 10*3/uL (ref 150–400)
RBC: 2.85 MIL/uL — ABNORMAL LOW (ref 3.87–5.11)
RDW: 17.2 % — ABNORMAL HIGH (ref 11.5–15.5)
WBC: 4.7 10*3/uL (ref 4.0–10.5)
nRBC: 0 % (ref 0.0–0.2)

## 2022-08-08 LAB — RENAL FUNCTION PANEL
Albumin: 2.8 g/dL — ABNORMAL LOW (ref 3.5–5.0)
Anion gap: 12 (ref 5–15)
BUN: 34 mg/dL — ABNORMAL HIGH (ref 8–23)
CO2: 27 mmol/L (ref 22–32)
Calcium: 8.5 mg/dL — ABNORMAL LOW (ref 8.9–10.3)
Chloride: 96 mmol/L — ABNORMAL LOW (ref 98–111)
Creatinine, Ser: 4.44 mg/dL — ABNORMAL HIGH (ref 0.44–1.00)
GFR, Estimated: 10 mL/min — ABNORMAL LOW (ref >60)
Glucose, Bld: 116 mg/dL — ABNORMAL HIGH (ref 70–99)
Phosphorus: 4.5 mg/dL (ref 2.5–4.6)
Potassium: 3.9 mmol/L (ref 3.5–5.1)
Sodium: 135 mmol/L (ref 135–145)

## 2022-08-08 MED ORDER — HYDRALAZINE HCL 50 MG PO TABS
75.0000 mg | ORAL_TABLET | Freq: Three times a day (TID) | ORAL | Status: DC
  Administered 2022-08-08 – 2022-08-11 (×9): 75 mg via ORAL
  Filled 2022-08-08 (×10): qty 1

## 2022-08-08 MED ORDER — QUETIAPINE FUMARATE 25 MG PO TABS
25.0000 mg | ORAL_TABLET | Freq: Every day | ORAL | Status: DC
  Administered 2022-08-08: 25 mg via ORAL
  Filled 2022-08-08: qty 1

## 2022-08-08 NOTE — Progress Notes (Addendum)
NAME:  SUNDY HOUCHINS, MRN:  [id number], DOB:  01/22/1956, LOS: 6 ADMISSION DATE:  07/31/2022  Subjective  Overnight events: none  Patient evaluated at bedside this AM.  Patient was seen sitting up in bed and eating her breakfast.  She endorses good sleep, good appetite.  Patient continues to endorse pain. I recommended to her to try using her Voltaren gel to help with pain.  Patient was amenable to this suggestion.  She does not endorse any other somatic complaints.  She does not endorse side effects attributable to medications.  Objective   Blood pressure (!) 142/83, pulse 78, temperature 98.5 F (36.9 C), temperature source Oral, resp. rate 16, weight 167 lb 12.3 oz (76.1 kg), SpO2 93 %.     Intake/Output Summary (Last 24 hours) at 08/08/2022 0734 Last data filed at 08/08/2022 0500 Gross per 24 hour  Intake 500 ml  Output 2000 ml  Net -1500 ml   Filed Weights   08/07/22 0804 08/07/22 1209 08/08/22 0500  Weight: 171 lb 11.8 oz (77.9 kg) 167 lb 12.3 oz (76.1 kg) 167 lb 12.3 oz (76.1 kg)   Physical Exam: General: in no acute distress HEENT: normocephalic and atraumatic Cardiovascular: regular rate Respiratory: CTAB posteriorly, normal respiratory effort, and on RA Gastrointestinal: non-tender and non-distended Extremities: moving all extremities spontaneously Neuro: following commands and no focal neurological deficits  Labs       Latest Ref Rng & Units 08/08/2022    2:27 AM 08/07/2022    3:42 AM 08/06/2022    3:00 AM  CBC  WBC 4.0 - 10.5 K/uL 4.7  5.4  6.0   Hemoglobin 12.0 - 15.0 g/dL 7.7  7.9  8.6   Hematocrit 36.0 - 46.0 % 23.9  25.0  27.4   Platelets 150 - 400 K/uL 198  201  206       Latest Ref Rng & Units 08/08/2022    2:27 AM 08/07/2022    3:42 AM 08/06/2022    3:00 AM  BMP  Glucose 70 - 99 mg/dL 116  78  111   BUN 8 - 23 mg/dL 34  48  31   Creatinine 0.44 - 1.00 mg/dL 4.44  5.60  3.95   Sodium 135 - 145 mmol/L 135  134  134   Potassium 3.5 - 5.1  mmol/L 3.9  4.3  3.8   Chloride 98 - 111 mmol/L 96  100  95   CO2 22 - 32 mmol/L 27  26  28    Calcium 8.9 - 10.3 mg/dL 8.5  8.6  8.5    Hb has been downtrending from 8.6 2 days ago, 7.9 yesterday, and 7.7 today Bun down from 48 to 34 Creatinine down from 5.6 to 4.4 Calcium stable at 8.5  Imaging: EKG obtained due to reported chest wall pain radiating to neck and down leg  Summary  AMYLAH WILL is a 66 y.o. 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   Assessment & Plan:  Principal Problem:   Acute hypoxemic respiratory failure (HCC) Active Problems:   Hypertension   Atrial fibrillation (HCC)   ESRD (end stage renal disease) on dialysis (HCC)   Anemia of chronic disease   Chronic pain syndrome   Need for follow-up by social worker   Acute congestive heart failure (HCC)   MDD (major depressive disorder)  #  Acute hypoxemic respiratory failure, improved #HFrEF exacerbation #ESRD on HD TThS Acute hypoxemic respiratory failure was initially secondary to incomplete HD sessions and is now improved. Patient continues to remain afebrile, is not tachycardic, is not tachypneic, and is not hypoxemic.  She continues to sat above 90% on room air and remains medically stable for discharge.  - HD today, continue to push her EDW - No indications for antibiotics still   #Acute on chronic pain Patient has a history of chronic pain, prior cocaine abuse, and multiple drug allergies.  She was initially given as needed Dilaudid given her acute trauma (fall), however this was tapered down and ended. She continues to endorses pain since being stopped off of Dilaudid. - Robaxin 500 mg tid  - Lidocaine patch prn  - Voltaren gel prn - Avoid NSAIDs - EKG after HD still pending   #Hypertension Blood pressure remains elevated with systolic in the 973 -  532D. - Coreg 12.5 mg twice daily - Losartan 100 mg daily - Increase hydralazine from 50 mg to 75 mg q8h - Isordil 20 mg 3 times daily   #Afib - Amiodarone 200 mg daily - Continue coreg as above - Eliquis 5 mg bid   #COPD Patient has not required any supplemental oxygen in a couple of days. - Maintain SpO2 88-92% - DuoNebs prn - Robitussin prn   #MDD #Insomnia - Mirtazepine 7.5 mg qhs - Quetiapine 50 mg qhs - Trazodone 50 mg qhs - Melatonin 3 mg qhs  Best practice:  Diet: Renal diet IVF: Fluids: none VTE: Eliquis Code: Full AB: none Therapy Recs: Home Health, DME: none Family Contact: none per APS case and patient preference DISPO: Anticipated discharge to Home pending  utilities/electricity in home .  Camelia Phenes, MD Internal Medicine Resident PGY-1 08/08/2022 7:34 AM PAGER: [phone]  If after hours (below), please contact on-call pager: [phone] 5PM-7AM Monday-Friday 1PM-7AM Saturday-Sunday

## 2022-08-08 NOTE — Progress Notes (Signed)
  [facility] Progress Note   Subjective: seen in room, no c/o's.   Objective Vitals:   08/07/22 1935 08/08/22 0420 08/08/22 0500 08/08/22 0801  BP: (!) 152/75 (!) 142/83  (!) 153/74  Pulse: 94 78  86  Resp: 18 16  18   Temp: 98.5 F (36.9 C) 98.5 F (36.9 C)  98.3 F (36.8 C)  TempSrc: Oral Oral  Oral  SpO2: 95% 93%  95%  Weight:   76.1 kg    Physical Exam General:Chronically ill appearing female in NAD Heart: S1,S2 RRR Lungs: CTAB  Abdomen: NABS, NT Extremities: 1+ pretib edema Dialysis Access: L AVF +t/b  HD orders: AF TTS 4 hrs 400/600  78.6kg   400/600  2K/3.0Ca bath  L AVF  Hep none - Mircera 150 MCG IV q 2 weeks (last 07/13/2022) - Venofer 100 mg IV X 10 doses (2/10 doses given)  Assessment/Plan: AHRF - improved, possible PNA +/- vol excess. SP abx x 3 days. 2kg under dry wt now. Off of O2. Medically ready for d/c per pmd.   ESRD -  HD TTS. Had extra HD Monday, back on TTS schedule. Next HD 11/7.  Access - per notes AVF had been difficult to cannulate. Has been referred to [facility] for f'gram. No issues with AVF here.  Volume - euvolemic now and 2.5 kg under dry wt. Lower dry wt at dc.  Anemia  - Hb 7.8. On ESA/IV Fe as outpatient. Due for ESA - will order. Continue Fe load when ABX completed.  Metabolic bone disease -  Corr Ca ok. Check phos with HD  Nutrition - Renal diet with fluid restriction  HFrEF 25-30% - manage volume with HD Dispo - concerns about home situation, transport to HD, electricity. SW working on it.   Kelly Splinter, MD 08/08/2022, 2:29 PM  Recent Labs  Lab 08/04/22 0542 08/05/22 0431 08/07/22 0342 08/08/22 0227  HGB 8.1*   < > 7.9* 7.7*  ALBUMIN 3.0*  --   --  2.8*  CALCIUM 8.4*   < > 8.6* 8.5*  PHOS 4.2  --   --  4.5  CREATININE 4.29*   < > 5.60* 4.44*  K 4.1   < > 4.3 3.9   < > = values in this interval not displayed.     Inpatient medications:  amiodarone  200 mg Oral Daily   apixaban  5 mg Oral BID   atorvastatin   20 mg Oral Daily   carvedilol  12.5 mg Oral BID   Chlorhexidine Gluconate Cloth  6 each Topical Q0600   darbepoetin (ARANESP) injection - DIALYSIS  150 mcg Subcutaneous Q Mon-1800   hydrALAZINE  75 mg Oral Q8H   isosorbide dinitrate  20 mg Oral TID   losartan  100 mg Oral Daily   melatonin  3 mg Oral QHS   methocarbamol  500 mg Oral TID   mirtazapine  7.5 mg Oral QHS   QUEtiapine  50 mg Oral QHS   senna  1 tablet Oral BID   traZODone  50 mg Oral QHS    diclofenac Sodium, guaiFENesin, ipratropium-albuterol, lidocaine, loperamide

## 2022-08-09 DIAGNOSIS — J9601 Acute respiratory failure with hypoxia: Secondary | ICD-10-CM | POA: Diagnosis not present

## 2022-08-09 LAB — BASIC METABOLIC PANEL
Anion gap: 14 (ref 5–15)
BUN: 44 mg/dL — ABNORMAL HIGH (ref 8–23)
CO2: 26 mmol/L (ref 22–32)
Calcium: 8.5 mg/dL — ABNORMAL LOW (ref 8.9–10.3)
Chloride: 94 mmol/L — ABNORMAL LOW (ref 98–111)
Creatinine, Ser: 5.82 mg/dL — ABNORMAL HIGH (ref 0.44–1.00)
GFR, Estimated: 7 mL/min — ABNORMAL LOW (ref >60)
Glucose, Bld: 81 mg/dL (ref 70–99)
Potassium: 4.2 mmol/L (ref 3.5–5.1)
Sodium: 134 mmol/L — ABNORMAL LOW (ref 135–145)

## 2022-08-09 LAB — CBC
HCT: 23.7 % — ABNORMAL LOW (ref 36.0–46.0)
Hemoglobin: 7.8 g/dL — ABNORMAL LOW (ref 12.0–15.0)
MCH: 27.1 pg (ref 26.0–34.0)
MCHC: 32.9 g/dL (ref 30.0–36.0)
MCV: 82.3 fL (ref 80.0–100.0)
Platelets: 173 10*3/uL (ref 150–400)
RBC: 2.88 MIL/uL — ABNORMAL LOW (ref 3.87–5.11)
RDW: 17 % — ABNORMAL HIGH (ref 11.5–15.5)
WBC: 4.6 10*3/uL (ref 4.0–10.5)
nRBC: 0 % (ref 0.0–0.2)

## 2022-08-09 MED ORDER — GABAPENTIN 300 MG PO CAPS
300.0000 mg | ORAL_CAPSULE | Freq: Every day | ORAL | Status: DC
  Administered 2022-08-09 – 2022-08-10 (×2): 300 mg via ORAL
  Filled 2022-08-09 (×2): qty 1

## 2022-08-09 MED ORDER — RENA-VITE PO TABS
1.0000 | ORAL_TABLET | Freq: Every day | ORAL | Status: DC
  Administered 2022-08-09 – 2022-08-10 (×2): 1 via ORAL
  Filled 2022-08-09 (×2): qty 1

## 2022-08-09 NOTE — Progress Notes (Signed)
Physical Therapy Treatment Patient Details Name: Audrey Hill MRN: [id number] DOB: 1955-12-23 Today's Date: 08/09/2022   History of Present Illness 66 yo F admitted 10/28 with SOB, CP after short HD due to fistula malfunctioning. 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 Pt denied attempting gait with cane and reliant on RW with pt stating she has all DME at home. Pt able to perform stairs with cues and encouragement but refused HEP or further activity. Encouraged continued gait to increase strength and function. Unsure how different pt is from baseline at this point.     Recommendations for follow up therapy are one component of a multi-disciplinary discharge planning process, led by the attending physician.  Recommendations may be updated based on patient status, additional functional criteria and insurance authorization.  Follow Up Recommendations  Home health PT     Assistance Recommended at Discharge PRN  Patient can return home with the following Assistance with cooking/housework;Assist for transportation   Equipment Recommendations  None recommended by PT    Recommendations for Other Services       Precautions / Restrictions Precautions Precautions: Fall Restrictions Weight Bearing Restrictions: No     Mobility  Bed Mobility Overal bed mobility: Modified Independent                  Transfers Overall transfer level: Modified independent                      Ambulation/Gait Ambulation/Gait assistance: Supervision Gait Distance (Feet): 300 Feet Assistive device: Rolling walker (2 wheels) Gait Pattern/deviations: Step-through pattern, Decreased stride length, Trunk flexed, Decreased stance time - right   Gait velocity interpretation: <1.8 ft/sec,  indicate of risk for recurrent falls   General Gait Details: pt with cues for posture and direction, pt unable to recall room number   Stairs Stairs: Yes Stairs assistance: Supervision Stair Management: Step to pattern, One rail Right Number of Stairs: 2 General stair comments: bil UE on rail with cues for sequence with step to pattern to ascend/descend steps   Wheelchair Mobility    Modified Rankin (Stroke Patients Only)       Balance Overall balance assessment: Mild deficits observed, not formally tested                                          Cognition Arousal/Alertness: Awake/alert Behavior During Therapy: Flat affect Overall Cognitive Status: Impaired/Different from baseline Area of Impairment: Memory                     Memory: Decreased short-term memory         General Comments: pt unable to recall room number, cannot state how long her hip has been hurting or how long she was living at daughter's house        Exercises      General Comments        Pertinent Vitals/Pain Pain Assessment Pain Score: 7  Pain Location: Rt hip Pain Descriptors / Indicators: Aching Pain Intervention(s): Limited activity within patient's tolerance, Repositioned, Monitored during session    Home Living  Prior Function            PT Goals (current goals can now be found in the care plan section) Progress towards PT goals: Progressing toward goals    Frequency    Min 2X/week      PT Plan Current plan remains appropriate    Co-evaluation              AM-PAC PT "6 Clicks" Mobility   Outcome Measure  Help needed turning from your back to your side while in a flat bed without using bedrails?: None Help needed moving from lying on your back to sitting on the side of a flat bed without using bedrails?: None Help needed moving to and from a bed to a chair (including a wheelchair)?: None Help  needed standing up from a chair using your arms (e.g., wheelchair or bedside chair)?: None Help needed to walk in hospital room?: A Little Help needed climbing 3-5 steps with a railing? : None 6 Click Score: 23    End of Session Equipment Utilized During Treatment: Gait belt Activity Tolerance: Patient tolerated treatment well Patient left: in bed;with call bell/phone within reach Nurse Communication: Mobility status PT Visit Diagnosis: Other abnormalities of gait and mobility (R26.89);Difficulty in walking, not elsewhere classified (R26.2)     Time: 3646-8032 PT Time Calculation (min) (ACUTE ONLY): 19 min  Charges:  $Gait Training: 8-22 mins                     Bayard Males, PT Acute Rehabilitation Services Office: [place] 08/09/2022, 11:26 AM

## 2022-08-09 NOTE — Progress Notes (Signed)
Mobility Specialist Progress Note:   08/09/22 1609  Mobility  Activity Stood at bedside  Level of Therapist, art wheel walker  Activity Response Tolerated well  Mobility Referral Yes  $Mobility charge 1 Mobility   Pt received in bed and agreeable. No complaints. Refused further mobility, no reason specified. Pt left sitting EOB with all needs met and call bell in reach.   Bernese Doffing Mobility Specialist-Acute Rehab Secure Chat only

## 2022-08-09 NOTE — Progress Notes (Signed)
Patient ID: Audrey Hill, female   DOB: 04/17/1956, 66 y.o.   MRN: [id number] [facility] Progress Note   Assessment/ Plan:   1.  Acute hypoxic respiratory failure: Secondary to combination of volume overload/possible community-acquired pneumonia.  Improving clinically status post ultrafiltration with hemodialysis and antibiotic therapy.  Appears medically ready to discharge. 2. ESRD: She is usually on a TTS dialysis schedule and had extra/urgent hemodialysis last week for volume unloading.  Will order for dialysis again tomorrow if she is still here.  Previous problems noted with cannulation of left brachiocephalic fistula noted. 3. Anemia: Notably anemic without any overt blood loss.  Continue ESA while here in the hospital and resume intravenous iron. 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 5. Nutrition: Albumin level currently low-likely acute phase response to infection/hospitalization.  Reiterated lean protein/continue ONS with HD. 6. Hypertension: Blood pressure marginally elevated, continue to monitor on current antihypertensive therapy and efforts at lowering dry weight.  Subjective:   Reports to be feeling better, breathing appears to be back to baseline   Objective:   BP (!) 141/68 (BP Location: Right Wrist)   Pulse 74   Temp 98 F (36.7 C)   Resp 18   Wt 76 kg   SpO2 94%   BMI 27.88 kg/m   Physical Exam: Gen: Appears comfortable sitting up in bed CVS: Pulse regular rhythm, normal rate, S1 and S2 normal Resp: Clear to auscultation bilaterally, no distinct rales or rhonchi Abd: Soft, flat, nontender, bowel sounds normal Ext: Trace lower extremity edema, left brachiocephalic fistula with pulsatility in inflow, high-pitched bruit audible mid fistula  Labs: BMET Recent Labs  Lab 08/03/22 0307 08/04/22 0542 08/05/22 0431 08/06/22 0300 08/07/22 0342 08/08/22 0227 08/09/22 0238  NA 137 134* 135 134* 134*  135 134*  K 4.3 4.1 4.4 3.8 4.3 3.9 4.2  CL 100 92* 97* 95* 100 96* 94*  CO2 27 27 25 28 26 27 26   GLUCOSE 98 150* 79 111* 78 116* 81  BUN 53* 38* 49* 31* 48* 34* 44*  CREATININE 5.25* 4.29* 5.39* 3.95* 5.60* 4.44* 5.82*  CALCIUM 8.0* 8.4* 8.6* 8.5* 8.6* 8.5* 8.5*  PHOS 4.7* 4.2  --   --   --  4.5  --    CBC Recent Labs  Lab 08/06/22 0300 08/07/22 0342 08/08/22 0227 08/09/22 0238  WBC 6.0 5.4 4.7 4.6  HGB 8.6* 7.9* 7.7* 7.8*  HCT 27.4* 25.0* 23.9* 23.7*  MCV 84.3 83.6 83.9 82.3  PLT 206 201 198 173      Medications:     amiodarone  200 mg Oral Daily   apixaban  5 mg Oral BID   atorvastatin  20 mg Oral Daily   carvedilol  12.5 mg Oral BID   Chlorhexidine Gluconate Cloth  6 each Topical Q0600   darbepoetin (ARANESP) injection - DIALYSIS  150 mcg Subcutaneous Q Mon-1800   hydrALAZINE  75 mg Oral Q8H   isosorbide dinitrate  20 mg Oral TID   losartan  100 mg Oral Daily   melatonin  3 mg Oral QHS   methocarbamol  500 mg Oral TID   mirtazapine  7.5 mg Oral QHS   QUEtiapine  25 mg Oral QHS   senna  1 tablet Oral BID   traZODone  50 mg Oral QHS   Elmarie Shiley, MD 08/09/2022, 8:04 AM

## 2022-08-09 NOTE — Progress Notes (Addendum)
NAME:  Audrey Hill, MRN:  [id number], DOB:  1955-12-30, LOS: 7 ADMISSION DATE:  07/31/2022  Subjective  Overnight events: none  Patient evaluated at bedside this AM.  Patient complained of continued pain, states the volterin gel does not help. She also complains of diarrhea. I informed patient we will evaluate and address these issues.  I informed patient we will discontinue the quetiapine for this admission due to QTc prolongation. She was amenable to this plan.  Objective   Blood pressure (!) 141/68, pulse 74, temperature 98 F (36.7 C), resp. rate 18, weight 167 lb 8.8 oz (76 kg), SpO2 94 %.     Intake/Output Summary (Last 24 hours) at 08/09/2022 1203 Last data filed at 08/09/2022 0532 Gross per 24 hour  Intake 480 ml  Output --  Net 480 ml   Filed Weights   08/07/22 1209 08/08/22 0500 08/09/22 0500  Weight: 167 lb 12.3 oz (76.1 kg) 167 lb 12.3 oz (76.1 kg) 167 lb 8.8 oz (76 kg)    Physical Exam: General: in no acute distress HEENT: normocephalic and atraumatic Cardiovascular: regular rate Respiratory: CTAB posteriorly, normal respiratory effort, and on RA Gastrointestinal: non-tender and non-distended Extremities: moving all extremities spontaneously Neuro: following commands and no focal neurological deficits  Labs       Latest Ref Rng & Units 08/09/2022    2:38 AM 08/08/2022    2:27 AM 08/07/2022    3:42 AM  CBC  WBC 4.0 - 10.5 K/uL 4.6  4.7  5.4   Hemoglobin 12.0 - 15.0 g/dL 7.8  7.7  7.9   Hematocrit 36.0 - 46.0 % 23.7  23.9  25.0   Platelets 150 - 400 K/uL 173  198  201       Latest Ref Rng & Units 08/09/2022    2:38 AM 08/08/2022    2:27 AM 08/07/2022    3:42 AM  BMP  Glucose 70 - 99 mg/dL 81  116  78   BUN 8 - 23 mg/dL 44  34  48   Creatinine 0.44 - 1.00 mg/dL 5.82  4.44  5.60   Sodium 135 - 145 mmol/L 134  135  134   Potassium 3.5 - 5.1 mmol/L 4.2  3.9  4.3   Chloride 98 - 111 mmol/L 94  96  100   CO2 22 - 32 mmol/L 26  27  26    Calcium  8.9 - 10.3 mg/dL 8.5  8.5  8.6    BUN up from 34 to 44 Cr up from 4.44 to 5.82 Calcium stable at 8.5 Hb Stable at 7.8  Imaging:   Summary  Audrey Hill is a 66 y.o. 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   Assessment & Plan:  Principal Problem:   Acute hypoxemic respiratory failure (HCC) Active Problems:   Hypertension   Atrial fibrillation (HCC)   ESRD (end stage renal disease) on dialysis (HCC)   Anemia of chronic disease   Chronic pain syndrome   Need for follow-up by social worker   Acute congestive heart failure (HCC)   MDD (major depressive disorder)  #Housing insecurity Patient still has no utilities at home for which she needs for her oxygen. SW addressing issue - f/u with SW  #Diarrhea Patient endorses diarrhea. Discontinued scheduled Senna - Hold constipation regimen - Continue loperamide  #Acute  on chronic pain Patient has a history of chronic pain, prior cocaine abuse, and multiple drug allergies.  She was initially given as needed Dilaudid given her acute trauma (fall), however this was tapered down and ended. She continues to endorses pain since Dilaudid stopped - Continue home gabapentin 300 mg qhs - Robaxin 500 mg tid  - Lidocaine patch prn  - Voltaren gel prn - Avoid NSAIDs  #Acute hypoxemic respiratory failure, improved #HFrEF exacerbation, resolved #ESRD on HD TThS Acute hypoxemic respiratory failure was initially secondary to incomplete HD sessions and is now improved. Patient continues to remain afebrile, is not tachycardic, is not tachypneic, and is not hypoxemic.  She continues to sat above 90% on room air and remains medically stable for discharge.  - HD today, continue to push her EDW - No indications for antibiotics still   #Hypertension Blood pressure remains elevated with systolic in  the 263 - 785Y. - Coreg 12.5 mg twice daily - Losartan 100 mg daily - Continue hydralazine from 75 mg q8h - Isordil 20 mg 3 times daily   #Afib - Amiodarone 200 mg daily - Continue coreg as above - Eliquis 5 mg bid   #COPD Patient has not required any supplemental oxygen in a couple of days. - Maintain SpO2 88-92% - DuoNebs prn - Robitussin prn    #MDD #Insomnia QTc 523 - f/u repeat EKG - Mirtazepine 7.5 mg qhs - Trazodone 50 mg qhs - Melatonin 3 mg qhs - Hold quetiapine given prolonged QTc  Best practice:  Diet: Renal diet IVF: Fluids: none VTE: Eliquis Code: Full AB: none Therapy Recs: Home Health, DME: none Family Contact: none per APS case and patient preference DISPO: Anticipated discharge to Home pending  utilities/electricity in home   Camelia Phenes, MD Internal Medicine Resident PGY-1 08/09/2022 12:03 PM PAGER: [phone]  If after hours (below), please contact on-call pager: [phone] 5PM-7AM Monday-Friday 1PM-7AM Saturday-Sunday

## 2022-08-09 NOTE — TOC Progression Note (Addendum)
Transition of Care [facility]) - Progression Note    Patient Details  Name: Audrey Hill MRN: [id number] Date of Birth: 1956-03-10  Transition of [facility] CM/SW [place], RN Phone Number: 08/09/2022, 10:32 AM  Clinical Narrative:    CM called and left a voicemail message with Governor Rooks, SW with DSS [phone]-  to assist with the patient's dialysis transportation needs.  I left a message with Elana Alm, TOC supervisor to follow up regarding possible assistance with utilities at the home.  Samule Ohm, APS SW continues to follow the patient for  disposition needs - patient with no electricity at the home and hospital leadership involved for assistance.  08/09/2022 1124 - Rolena Infante, TOC Supervisor states that hospital administration will need proof of income.  I spoke with the patient at the bedside and she states that she does not have access to copies of her bank account to assist.  I called Samule Ohm, APS SW - she states that she does not have acces to income.  I called and left a message with Governor Rooks, Medicaid worker.  08/09/2022 1155 - CM received a general statement from Samule Ohm, [place] documenting patient's income.  This email was forwarded to Nathaniel Man, [facility] Supervisor to assist with patient's past due utility bills,  CM will continue to follow up with attending physician team regarding patient's barriers to discharge including lack of electricity at the home and coordination of Medicaid transportation to dialysis.   Expected Discharge Plan: [place] Barriers to Discharge: Continued Medical Work up  Expected Discharge Plan and Services Expected Discharge Plan: [place]   Discharge Planning Services: CM Consult   Living arrangements for the past 2 months: Apartment                                       Social Determinants of Health (SDOH) Interventions    Readmission Risk  Interventions    08/02/2022    3:40 PM  Readmission Risk Prevention Plan  Transportation Screening Complete  PCP or Specialist Appt within 5-7 Days Complete  Home Care Screening Complete  Medication Review (RN CM) Complete

## 2022-08-09 NOTE — Plan of Care (Signed)

## 2022-08-10 DIAGNOSIS — M549 Dorsalgia, unspecified: Secondary | ICD-10-CM | POA: Diagnosis not present

## 2022-08-10 DIAGNOSIS — G8929 Other chronic pain: Secondary | ICD-10-CM

## 2022-08-10 DIAGNOSIS — J9601 Acute respiratory failure with hypoxia: Secondary | ICD-10-CM | POA: Diagnosis not present

## 2022-08-10 DIAGNOSIS — N186 End stage renal disease: Secondary | ICD-10-CM | POA: Diagnosis not present

## 2022-08-10 DIAGNOSIS — Z992 Dependence on renal dialysis: Secondary | ICD-10-CM

## 2022-08-10 LAB — COMPREHENSIVE METABOLIC PANEL
ALT: 35 U/L (ref 0–44)
AST: 38 U/L (ref 15–41)
Albumin: 2.9 g/dL — ABNORMAL LOW (ref 3.5–5.0)
Alkaline Phosphatase: 140 U/L — ABNORMAL HIGH (ref 38–126)
Anion gap: 13 (ref 5–15)
BUN: 56 mg/dL — ABNORMAL HIGH (ref 8–23)
CO2: 23 mmol/L (ref 22–32)
Calcium: 8.2 mg/dL — ABNORMAL LOW (ref 8.9–10.3)
Chloride: 97 mmol/L — ABNORMAL LOW (ref 98–111)
Creatinine, Ser: 7.16 mg/dL — ABNORMAL HIGH (ref 0.44–1.00)
GFR, Estimated: 6 mL/min — ABNORMAL LOW (ref >60)
Glucose, Bld: 118 mg/dL — ABNORMAL HIGH (ref 70–99)
Potassium: 4.5 mmol/L (ref 3.5–5.1)
Sodium: 133 mmol/L — ABNORMAL LOW (ref 135–145)
Total Bilirubin: 0.6 mg/dL (ref 0.3–1.2)
Total Protein: 6.4 g/dL — ABNORMAL LOW (ref 6.5–8.1)

## 2022-08-10 LAB — CBC
HCT: 25 % — ABNORMAL LOW (ref 36.0–46.0)
Hemoglobin: 8 g/dL — ABNORMAL LOW (ref 12.0–15.0)
MCH: 26.6 pg (ref 26.0–34.0)
MCHC: 32 g/dL (ref 30.0–36.0)
MCV: 83.1 fL (ref 80.0–100.0)
Platelets: 173 10*3/uL (ref 150–400)
RBC: 3.01 MIL/uL — ABNORMAL LOW (ref 3.87–5.11)
RDW: 17.2 % — ABNORMAL HIGH (ref 11.5–15.5)
WBC: 4 10*3/uL (ref 4.0–10.5)
nRBC: 0 % (ref 0.0–0.2)

## 2022-08-10 MED ORDER — KETOROLAC TROMETHAMINE 15 MG/ML IJ SOLN
7.5000 mg | Freq: Once | INTRAMUSCULAR | Status: AC
  Administered 2022-08-10: 7.5 mg via INTRAVENOUS
  Filled 2022-08-10: qty 1
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
## 2022-08-10 NOTE — Procedures (Signed)
HD Note:  Some information was entered later than the data was gathered due to patient care needs. The stated time with the data is accurate.Received patient in bed to unit.  Alert and oriented.  Informed consent signed and in chart.   Patient tolerated well. She had some back/radiating to leg pain.  Dr. Posey Pronto ordered med/given.  She had coughing and med given.  See MAR. Transported back to the room  Alert, without acute distress.  Hand-off given to patient's nurse.   Access used: L AVF with no issuesAccess issues:   Total UF removed: [facility]

## 2022-08-10 NOTE — Progress Notes (Signed)
Advised by RN CM that pt may d/c to home tomorrow or Thursday. Contacted [facility] SW to advise clinic of pt's possible d/c and will confirm D/C date once known. Update provided to clinic staff regarding pt's transportation plans to/from HD at d/c per RN CM.   Melven Sartorius Renal Navigator [phone]

## 2022-08-10 NOTE — Plan of Care (Signed)
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
## 2022-08-10 NOTE — Procedures (Signed)
Patient seen on Hemodialysis. BP 108/62   Pulse 73   Temp 98.8 F (37.1 C)   Resp (!) 22   Wt 77.8 kg   SpO2 97%   BMI 28.54 kg/m   QB 300, UF goal 2.5L Tolerating treatment without complaints at this time.   Elmarie Shiley MD [facility]. Office # [phone] Pager # [phone] 9:50 AM

## 2022-08-11 ENCOUNTER — Other Ambulatory Visit (HOSPITAL_COMMUNITY): Payer: Self-pay

## 2022-08-11 LAB — BASIC METABOLIC PANEL
Anion gap: 8 (ref 5–15)
BUN: 40 mg/dL — ABNORMAL HIGH (ref 8–23)
CO2: 30 mmol/L (ref 22–32)
Calcium: 8.2 mg/dL — ABNORMAL LOW (ref 8.9–10.3)
Chloride: 98 mmol/L (ref 98–111)
Creatinine, Ser: 5.07 mg/dL — ABNORMAL HIGH (ref 0.44–1.00)
GFR, Estimated: 9 mL/min — ABNORMAL LOW (ref >60)
Glucose, Bld: 99 mg/dL (ref 70–99)
Potassium: 4.2 mmol/L (ref 3.5–5.1)
Sodium: 136 mmol/L (ref 135–145)

## 2022-08-11 MED ORDER — APIXABAN 5 MG PO TABS: 5.0000 mg | ORAL_TABLET | Freq: Two times a day (BID) | ORAL | 0 refills | Status: DC

## 2022-08-11 MED ORDER — HYDRALAZINE HCL 25 MG PO TABS
75.0000 mg | ORAL_TABLET | Freq: Three times a day (TID) | ORAL | 0 refills | Status: DC
  Filled 2022-08-11: qty 270, 30d supply, fill #0

## 2022-08-11 MED ORDER — GUAIFENESIN 100 MG/5ML PO LIQD
5.0000 mL | ORAL | 0 refills | Status: DC | PRN
  Filled 2022-08-11: qty 120, 4d supply, fill #0

## 2022-08-11 MED ORDER — LOSARTAN POTASSIUM 100 MG PO TABS
100.0000 mg | ORAL_TABLET | Freq: Every day | ORAL | 0 refills | Status: DC
  Filled 2022-08-11: qty 30, 30d supply, fill #0

## 2022-08-11 MED ORDER — TRAZODONE HCL 50 MG PO TABS
50.0000 mg | ORAL_TABLET | Freq: Every day | ORAL | 0 refills | Status: DC
  Filled 2022-08-11: qty 30, 30d supply, fill #0

## 2022-08-11 MED ORDER — CARVEDILOL 12.5 MG PO TABS
12.5000 mg | ORAL_TABLET | Freq: Two times a day (BID) | ORAL | 0 refills | Status: DC
  Filled 2022-08-11: qty 60, 30d supply, fill #0

## 2022-08-11 MED ORDER — MIRTAZAPINE 7.5 MG PO TABS
7.5000 mg | ORAL_TABLET | Freq: Every day | ORAL | 0 refills | Status: DC
  Filled 2022-08-11 (×2): qty 15, 30d supply, fill #0
  Filled 2022-08-11: qty 30, 30d supply, fill #0

## 2022-08-11 MED ORDER — MELATONIN 3 MG PO TABS
3.0000 mg | ORAL_TABLET | Freq: Every day | ORAL | 0 refills | Status: AC
  Filled 2022-08-11: qty 30, 30d supply, fill #0

## 2022-08-11 MED ORDER — APIXABAN 5 MG PO TABS
5.0000 mg | ORAL_TABLET | Freq: Two times a day (BID) | ORAL | 0 refills | Status: DC
  Filled 2022-08-11 (×2): qty 60, 30d supply, fill #0

## 2022-08-11 NOTE — TOC Progression Note (Addendum)
Transition of Care [facility]) - Progression Note    Patient Details  Name: Audrey Hill MRN: [id number] Date of Birth: [birth year]  Transition of Care [facility]) CM/SW [place], RN Phone Number: 08/11/2022, 9:37 AM  Clinical Narrative:    CM called and spoke with [facility] this morning and confirmed that the patient's power will be turned back on today - no time was given by the utility company.  The patient's overdue bill was paid by [facility] leadership yesterday afternoon for total of 683.43.  The patient arranged [facility] [place] to have deposit paid over the next 60 days for a total of 350.00.  I called [place] [phone]- transportation through [facility] and the patient's dialysis transportation appointments were set up for the next 2 weeks.  The [facility] [employer] transportation number will be provided to the patient and the [facility] SW SW to follow up.  The patient is currently on 2L/minute nasal cannula oxygen this morning per bedside nursing.  I spoke with the patient and she states that she was unsure about her oxygen provider company at the home and confirmed that her oxygen was "messed up".  Attending physician, Dr. Edd Arbour was notified and new oxygen orders will be placed and I will follow up for home oxygen if needed prior to discharge to home.  08/11/2022 - Ambulation test for oxygen needs performed at the bedside and patient was confirmed by attending physician that she will not need home oxygen.  Discharge medications will be provided through [place].  Transportation to home will be provided through [employer].  The patient states that she has keys to her home.  Case manager with [facility] will follow up with the patient regarding home health services.  Orders for home health are placed and signed by MD.  [employer] [employer] orders were sent earlier in the week and the CM with the agency will call the patient to set up personal care services  at a later date.  Melven Sartorius, Renal Navigator will update the [facility] SW social worker will follow up with the patient regarding transportation through [facility] and/or Medicaid transportation coordinated through [place], DSS.  I called and spoke with Samule Ohm, [place] [phone] is aware that patient is being discharged home today and will follow up with the patient in the community.  I explained to her that I have not received a follow up phone call from [place], DSS SW to set up Medicaid transportation and she states that she will send Governor Rooks, DSS SW an email to ask that she follow up with the patient.  CM will continue to follow the patient for return to home today.   Expected Discharge Plan: [place] Barriers to Discharge: Continued Medical Work up  Expected Discharge Plan and Services Expected Discharge Plan: [place]   Discharge Planning Services: CM Consult   Living arrangements for the past 2 months: Apartment                                       Social Determinants of Health (SDOH) Interventions    Readmission Risk Interventions    08/02/2022    3:40 PM  Readmission Risk Prevention Plan  Transportation Screening Complete  PCP or Specialist Appt within 5-7 Days Complete  Home Care Screening Complete  Medication Review (RN CM) Complete

## 2022-08-11 NOTE — Progress Notes (Signed)
Nurse requested Mobility Specialist to perform oxygen saturation test with pt which includes removing pt from oxygen both at rest and while ambulating.  Below are the results from that testing.      Patient Saturations on Room Air at Rest = spO2 95%   Patient Saturations on Room Air while Ambulating = sp02 95%.   At end of testing pt left in room on Room Air   Reported results to nurse.

## 2022-08-11 NOTE — Progress Notes (Signed)
Patient ID: Audrey Hill, female   DOB: [birth year], 66 y.o.   MRN: [id number] [facility] Progress Note   Assessment/ Plan:   1.  Acute hypoxic respiratory failure: Secondary to combination of volume overload/possible community-acquired pneumonia.  Improved with hemodialysis/UF and status post antibiotics.  May need reassessment of oxygenation requirements prior to discharge. 2. ESRD: She is usually on a TTS dialysis schedule and had extra/urgent hemodialysis last week for volume unloading.  Will order for dialysis again tomorrow if she is still here.  CSW and renal navigator have worked closely with the patient to help confirm that she has transportation and resources to maintain adherence with outpatient dialysis. 3. Anemia: Notably anemic without any overt blood loss.  On weekly ESA. 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 5. Nutrition: Albumin level currently low-likely acute phase response to infection/hospitalization.  Reiterated lean protein/continue ONS with HD. 6. Hypertension: Blood pressure trend overall improving with ongoing hemodialysis/UF and antihypertensive therapy.  Subjective:   Continues to report feeling better with intermittent aches and pains.   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, high-pitched bruit audible mid fistula  Labs: BMET Recent Labs  Lab 08/05/22 0431 08/06/22 0300 08/07/22 0342 08/08/22 0227 08/09/22 0238 08/10/22 0538 08/11/22 0351  NA  135 134* 134* 135 134* 133* 136  K 4.4 3.8 4.3 3.9 4.2 4.5 4.2  CL 97* 95* 100 96* 94* 97* 98  CO2 25 28 26 27 26 23 30   GLUCOSE 79 111* 78 116* 81 118* 99  BUN 49* 31* 48* 34* 44* 56* 40*  CREATININE 5.39* 3.95* 5.60* 4.44* 5.82* 7.16* 5.07*  CALCIUM 8.6* 8.5* 8.6* 8.5* 8.5* 8.2* 8.2*  PHOS  --   --   --  4.5  --   --   --    CBC Recent Labs  Lab 08/07/22 0342 08/08/22 0227 08/09/22 0238 08/10/22 0538  WBC 5.4 4.7 4.6 4.0  HGB 7.9* 7.7* 7.8* 8.0*  HCT 25.0* 23.9* 23.7* 25.0*  MCV 83.6 83.9 82.3 83.1  PLT 201 198 173 173      Medications:     amiodarone  200 mg Oral Daily   apixaban  5 mg Oral BID   atorvastatin  20 mg Oral Daily   carvedilol  12.5 mg Oral BID   Chlorhexidine Gluconate Cloth  6 each Topical Q0600   darbepoetin (ARANESP) injection - DIALYSIS  150 mcg Subcutaneous Q Mon-1800   gabapentin  300 mg Oral QHS   hydrALAZINE  75 mg Oral Q8H   isosorbide dinitrate  20 mg Oral TID   losartan  100 mg Oral Daily   melatonin  3 mg Oral QHS   methocarbamol  500 mg Oral TID   mirtazapine  7.5 mg Oral QHS   multivitamin  1 tablet Oral QHS   traZODone  50 mg Oral QHS   Elmarie Shiley, MD 08/11/2022, 8:40 AM

## 2022-08-11 NOTE — Discharge Instructions (Addendum)
Dear Audrey Hill,  It was a pleasure to take care of you during your stay at  [facility]  where you were treated for your Acute hypoxemic respiratory failure ([place]).  While you were here, you were:  observed and cared for by our nurses and nursing assistants  provided therapy by our physical therapy and occupational therapy staff  treated with medicines / procedures by your doctors  provided resources by our social workers and case managers  Please review the medication list provided to you at discharge and stop, start taking, or continue taking the medications listed there.  You should also follow-up with your primary care doctor, or start seeing one if you don't have one yet. Here are some scheduled follow-ups for you:  Follow-up Information     [facility] [place] Follow up.   Specialty: Home Health Services Why: [facility] will be providing physical therapy, aide and social worker.  They will call you to restart services within 24-48 hours of your return to the home. Contact information: [place] [address] [phone]         [facility] Follow up.   Why: You are set up for transportation to/from dialysis appointments starting 08/12/2022 through 08/26/2022.  Please follow up with Dialysis Center to assist with transportation needs past this date. Contact information: [phone]                 I recommend abstinence from alcohol, tobacco, and other illicit drug use.    If your symptoms recur, worsen, or if you have side effects to your medications, call your outpatient provider, 911, or go to the nearest emergency department.  Take care!  Camelia Phenes, MD [facility] Psychiat[facility] (Physician) 08/11/2022 11:48  AM    Information on my medicine - ELIQUIS (apixaban)  *please take (2) of the 2.5 mg Eliquis tablets twice a day until you have exhausted this supply and then start taking (1) 5 mg tablet teice a day thereafter*  This medication education was reviewed with me or my healthcare representative as part of my discharge preparation.  Why was Eliquis prescribed for you? Eliquis was prescribed for you to reduce the risk of a blood clot forming that can cause a stroke if you have a medical condition called atrial fibrillation (a type of irregular heartbeat).  What do You need to know about Eliquis ? Take your Eliquis TWICE DAILY - one tablet in the morning and one tablet in the evening with or without food. If you have difficulty swallowing the tablet whole please discuss with your pharmacist how to take the medication safely.  Take Eliquis exactly as prescribed by your doctor and DO NOT stop taking Eliquis without talking to the doctor who prescribed the medication.  Stopping may increase your risk of developing a stroke.  Refill your prescription before you run out.  After discharge, you should have regular check-up appointments with your healthcare provider that is prescribing your Eliquis.  In the future your dose may need to be changed if your kidney function or weight changes by a significant amount or as you get older.  What do you do if you miss a dose? If you miss a dose, take it as soon as you remember on the same day and resume taking twice daily.  Do not take more than one dose of ELIQUIS at the same  time to make up a missed dose.  Important Safety Information  A possible side effect of Eliquis is bleeding. You should call your healthcare provider right away if you experience any of the following: Bleeding from an injury or your nose that does not stop. Unusual colored urine (red or dark brown) or unusual colored stools (red or black). Unusual bruising for unknown reasons. A serious fall or if you hit your head (even if there is no bleeding).  Some medicines may interact with Eliquis and might increase your risk of bleeding or clotting while on Eliquis. To help avoid this, consult your healthcare provider or pharmacist prior to using any new prescription or non-prescription medications, including herbals, vitamins, non-steroidal anti-inflammatory drugs (NSAIDs) and supplements.  This website has more information on Eliquis (apixaban): http://www.eliquis.com/eliquis/home
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
## 2022-08-11 NOTE — Progress Notes (Signed)
Mobility Specialist Progress Note:   08/11/22 1008  Mobility  Activity Ambulated with assistance in hallway  Level of Assistance Standby assist, set-up cues, supervision of patient - no hands on  Assistive Device Front wheel walker  Distance Ambulated (ft) 400 ft  Activity Response Tolerated well  Mobility Referral Yes  $Mobility charge 1 Mobility   Pt received in bed and agreeable with encouragement. Performed oxygen saturation test per RN request. C/o pain in right hip, no rating given. Pt left in bed with all needs met and call bell in reach.   Karmyn Lowman Mobility Specialist-Acute Rehab Secure Chat only

## 2022-08-11 NOTE — Progress Notes (Signed)
Pt to d/c to home today. Contacted [facility] to advise clinic that pt will d/c today and resume care tomorrow. Provided clinic the info from RN CM regarding pt's transportation arrangements through [facility] to/from HD appts.   Melven Sartorius Renal Navigator [phone]

## 2022-08-12 ENCOUNTER — Telehealth: Payer: Self-pay | Admitting: Physician Assistant

## 2022-08-12 NOTE — Telephone Encounter (Signed)
Transition of care contact from inpatient facility  Date of Discharge: 08/11/22 Date of Contact: 08/12/22 Method of contact: Phone  Attempted to contact patient to discuss transition of care from inpatient admission. Patient did not answer the phone. Unable to leave message.   Anice Paganini, PA-C 08/12/2022, 11:51 AM  [facility] Pager: [phone]

## 2022-08-17 ENCOUNTER — Encounter: Payer: Medicare HMO | Admitting: Student

## 2022-08-18 ENCOUNTER — Other Ambulatory Visit: Payer: Self-pay | Admitting: Vascular Surgery

## 2022-08-18 DIAGNOSIS — N186 End stage renal disease: Secondary | ICD-10-CM

## 2022-08-23 ENCOUNTER — Telehealth: Payer: Self-pay

## 2022-08-23 NOTE — Telephone Encounter (Signed)
Return call to Ronalee Belts who is a PT assistance at [facility] - he's unavailable. Left message I will notify pt's doctor that pt had been unavailable and missed appts.

## 2022-08-23 NOTE — Telephone Encounter (Signed)
Ronalee Belts from [facility] called Ronalee Belts is reporting a missed visit for patient. Ronalee Belts stated she has missed 2 appointment last week as well. Ronalee Belts has called her phone multiple times and has stopped by her residence unannounced, no one is answering the phone or answering the door. Ronalee Belts stated he confirmed patients appointment with her daughter per daughter she should be home. Patient has not received pt treatment. Please give mike a call back [phone]

## 2022-09-30 ENCOUNTER — Other Ambulatory Visit: Payer: Self-pay | Admitting: Student

## 2022-09-30 MED ORDER — LOSARTAN POTASSIUM 100 MG PO TABS: 100.0000 mg | ORAL_TABLET | Freq: Every day | ORAL | 0 refills | Status: DC

## 2022-10-15 ENCOUNTER — Other Ambulatory Visit: Payer: Self-pay | Admitting: Student

## 2022-10-26 ENCOUNTER — Other Ambulatory Visit: Payer: Self-pay | Admitting: *Deleted

## 2022-10-30 ENCOUNTER — Encounter (HOSPITAL_COMMUNITY): Payer: Self-pay | Admitting: Emergency Medicine

## 2022-10-30 ENCOUNTER — Emergency Department (HOSPITAL_COMMUNITY): Payer: Medicare HMO

## 2022-10-30 ENCOUNTER — Other Ambulatory Visit: Payer: Self-pay

## 2022-10-30 ENCOUNTER — Emergency Department (HOSPITAL_COMMUNITY)
Admission: EM | Admit: 2022-10-30 | Discharge: 2022-10-30 | Disposition: A | Discharge status: Home / Self Care | Payer: Medicare HMO | Attending: Emergency Medicine | Admitting: Emergency Medicine

## 2022-10-30 DIAGNOSIS — I4891 Unspecified atrial fibrillation: Secondary | ICD-10-CM | POA: Diagnosis not present

## 2022-10-30 DIAGNOSIS — R053 Chronic cough: Secondary | ICD-10-CM | POA: Diagnosis not present

## 2022-10-30 DIAGNOSIS — I5030 Unspecified diastolic (congestive) heart failure: Secondary | ICD-10-CM | POA: Diagnosis not present

## 2022-10-30 DIAGNOSIS — Z992 Dependence on renal dialysis: Secondary | ICD-10-CM | POA: Insufficient documentation

## 2022-10-30 DIAGNOSIS — Z87891 Personal history of nicotine dependence: Secondary | ICD-10-CM | POA: Insufficient documentation

## 2022-10-30 DIAGNOSIS — R0602 Shortness of breath: Secondary | ICD-10-CM | POA: Diagnosis present

## 2022-10-30 DIAGNOSIS — J45909 Unspecified asthma, uncomplicated: Secondary | ICD-10-CM | POA: Insufficient documentation

## 2022-10-30 DIAGNOSIS — Z96652 Presence of left artificial knee joint: Secondary | ICD-10-CM | POA: Insufficient documentation

## 2022-10-30 DIAGNOSIS — J449 Chronic obstructive pulmonary disease, unspecified: Secondary | ICD-10-CM | POA: Diagnosis not present

## 2022-10-30 DIAGNOSIS — I132 Hypertensive heart and chronic kidney disease with heart failure and with stage 5 chronic kidney disease, or end stage renal disease: Secondary | ICD-10-CM | POA: Diagnosis not present

## 2022-10-30 DIAGNOSIS — Z79899 Other long term (current) drug therapy: Secondary | ICD-10-CM | POA: Insufficient documentation

## 2022-10-30 DIAGNOSIS — N186 End stage renal disease: Secondary | ICD-10-CM | POA: Diagnosis not present

## 2022-10-30 DIAGNOSIS — Z7901 Long term (current) use of anticoagulants: Secondary | ICD-10-CM | POA: Insufficient documentation

## 2022-10-30 DIAGNOSIS — Z8673 Personal history of transient ischemic attack (TIA), and cerebral infarction without residual deficits: Secondary | ICD-10-CM | POA: Diagnosis not present

## 2022-10-30 DIAGNOSIS — Z1152 Encounter for screening for COVID-19: Secondary | ICD-10-CM | POA: Diagnosis not present

## 2022-10-30 LAB — BASIC METABOLIC PANEL
Anion gap: 12 (ref 5–15)
BUN: 33 mg/dL — ABNORMAL HIGH (ref 8–23)
CO2: 30 mmol/L (ref 22–32)
Calcium: 8.6 mg/dL — ABNORMAL LOW (ref 8.9–10.3)
Chloride: 97 mmol/L — ABNORMAL LOW (ref 98–111)
Creatinine, Ser: 4.17 mg/dL — ABNORMAL HIGH (ref 0.44–1.00)
GFR, Estimated: 11 mL/min — ABNORMAL LOW (ref >60)
Glucose, Bld: 98 mg/dL (ref 70–99)
Potassium: 3.5 mmol/L (ref 3.5–5.1)
Sodium: 139 mmol/L (ref 135–145)

## 2022-10-30 LAB — CBC WITH DIFFERENTIAL/PLATELET
Abs Immature Granulocytes: 0.01 10*3/uL (ref 0.00–0.07)
Basophils Absolute: 0.1 10*3/uL (ref 0.0–0.1)
Basophils Relative: 1 %
Eosinophils Absolute: 0.1 10*3/uL (ref 0.0–0.5)
Eosinophils Relative: 3 %
HCT: 33 % — ABNORMAL LOW (ref 36.0–46.0)
Hemoglobin: 10.4 g/dL — ABNORMAL LOW (ref 12.0–15.0)
Immature Granulocytes: 0 %
Lymphocytes Relative: 15 %
Lymphs Abs: 0.6 10*3/uL — ABNORMAL LOW (ref 0.7–4.0)
MCH: 25.1 pg — ABNORMAL LOW (ref 26.0–34.0)
MCHC: 31.5 g/dL (ref 30.0–36.0)
MCV: 79.5 fL — ABNORMAL LOW (ref 80.0–100.0)
Monocytes Absolute: 0.5 10*3/uL (ref 0.1–1.0)
Monocytes Relative: 12 %
Neutro Abs: 2.8 10*3/uL (ref 1.7–7.7)
Neutrophils Relative %: 69 %
Platelets: 119 10*3/uL — ABNORMAL LOW (ref 150–400)
RBC: 4.15 MIL/uL (ref 3.87–5.11)
RDW: 17.8 % — ABNORMAL HIGH (ref 11.5–15.5)
WBC: 4.1 10*3/uL (ref 4.0–10.5)
nRBC: 0 % (ref 0.0–0.2)

## 2022-10-30 LAB — RESP PANEL BY RT-PCR (RSV, FLU A&B, COVID)  RVPGX2
Influenza A by PCR: NEGATIVE
Influenza B by PCR: NEGATIVE
Resp Syncytial Virus by PCR: NEGATIVE
SARS Coronavirus 2 by RT PCR: NEGATIVE

## 2022-10-30 LAB — BRAIN NATRIURETIC PEPTIDE: B Natriuretic Peptide: 2475.9 pg/mL — ABNORMAL HIGH (ref 0.0–100.0)

## 2022-10-30 MED ORDER — IPRATROPIUM-ALBUTEROL 0.5-2.5 (3) MG/3ML IN SOLN
3.0000 mL | Freq: Once | RESPIRATORY_TRACT | Status: AC
  Administered 2022-10-30: 3 mL via RESPIRATORY_TRACT
  Filled 2022-10-30: qty 3

## 2022-10-30 MED ORDER — GUAIFENESIN 100 MG/5ML PO LIQD
5.0000 mL | Freq: Once | ORAL | Status: AC
  Administered 2022-10-30: 5 mL via ORAL
  Filled 2022-10-30: qty 5

## 2022-10-30 MED ORDER — LIDOCAINE 5 % EX PTCH: 1.0000 | MEDICATED_PATCH | Freq: Every day | CUTANEOUS | 0 refills | Status: DC | PRN

## 2022-10-30 MED ORDER — HYDROMORPHONE HCL 1 MG/ML IJ SOLN
0.5000 mg | Freq: Once | INTRAMUSCULAR | Status: AC
  Administered 2022-10-30: 0.5 mg via INTRAVENOUS
  Filled 2022-10-30: qty 1

## 2022-10-30 MED ORDER — METHOCARBAMOL 500 MG PO TABS: 500.0000 mg | ORAL_TABLET | Freq: Two times a day (BID) | ORAL | 0 refills | Status: AC

## 2022-10-30 MED ORDER — DEXTROMETHORPHAN HBR 15 MG/5ML PO SYRP: 10.0000 mL | ORAL_SOLUTION | Freq: Three times a day (TID) | ORAL | 0 refills | Status: DC | PRN

## 2022-10-30 MED ORDER — LIDOCAINE 5 % EX PTCH
1.0000 | MEDICATED_PATCH | Freq: Once | CUTANEOUS | Status: DC
  Administered 2022-10-30: 1 via TRANSDERMAL
  Filled 2022-10-30: qty 1

## 2022-10-30 MED ORDER — MIRTAZAPINE 7.5 MG PO TABS: 7.5000 mg | ORAL_TABLET | Freq: Every day | ORAL | 0 refills | Status: DC

## 2022-10-30 MED ORDER — METHOCARBAMOL 500 MG PO TABS: 500.0000 mg | ORAL_TABLET | Freq: Two times a day (BID) | ORAL | 0 refills | Status: DC

## 2022-10-30 MED ORDER — METHYLPREDNISOLONE SODIUM SUCC 125 MG IJ SOLR
62.5000 mg | Freq: Once | INTRAMUSCULAR | Status: AC
  Administered 2022-10-30: 62.5 mg via INTRAVENOUS
  Filled 2022-10-30: qty 2

## 2022-10-30 MED ORDER — ONDANSETRON HCL 4 MG/2ML IJ SOLN
4.0000 mg | Freq: Once | INTRAMUSCULAR | Status: AC
  Administered 2022-10-30: 4 mg via INTRAVENOUS
  Filled 2022-10-30: qty 2

## 2022-10-30 NOTE — ED Triage Notes (Signed)
Pt to ER via EMS from dialysis with reports of SOB and cough for last 4-5 days.  Pt was seen at [facility] on Thursday and pt reports "they did not do anything".  Pt arrives with noted labored breathing and congested cough, which she reports is productive of green sputum.  Only completed 1 1/2 hours of her dialysis today, has not missed any treatments.

## 2022-10-30 NOTE — Discharge Instructions (Signed)
Please be sure to keep your dialysis session appointment on Tuesday.  Continue taking antitussive/cough medication as needed.  It was a pleasure caring for you today in the emergency department.  Please return to the emergency department for any worsening or worrisome symptoms.

## 2022-10-30 NOTE — ED Notes (Signed)
Patient ambulatory, no verbal c/o pain or discomfort.No distress noted.  Will continue to monitor
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
## 2022-11-06 ENCOUNTER — Emergency Department (HOSPITAL_COMMUNITY)
Admission: EM | Admit: 2022-11-06 | Discharge: 2022-11-07 | Disposition: A | Discharge status: Home / Self Care | Payer: Medicare HMO | Attending: Emergency Medicine | Admitting: Emergency Medicine

## 2022-11-06 ENCOUNTER — Emergency Department (HOSPITAL_COMMUNITY): Payer: Medicare HMO

## 2022-11-06 ENCOUNTER — Other Ambulatory Visit: Payer: Self-pay

## 2022-11-06 DIAGNOSIS — I132 Hypertensive heart and chronic kidney disease with heart failure and with stage 5 chronic kidney disease, or end stage renal disease: Secondary | ICD-10-CM | POA: Diagnosis not present

## 2022-11-06 DIAGNOSIS — Z7901 Long term (current) use of anticoagulants: Secondary | ICD-10-CM | POA: Diagnosis not present

## 2022-11-06 DIAGNOSIS — I4891 Unspecified atrial fibrillation: Secondary | ICD-10-CM | POA: Diagnosis not present

## 2022-11-06 DIAGNOSIS — Z79899 Other long term (current) drug therapy: Secondary | ICD-10-CM | POA: Diagnosis not present

## 2022-11-06 DIAGNOSIS — N186 End stage renal disease: Secondary | ICD-10-CM | POA: Insufficient documentation

## 2022-11-06 DIAGNOSIS — Z992 Dependence on renal dialysis: Secondary | ICD-10-CM | POA: Insufficient documentation

## 2022-11-06 DIAGNOSIS — R531 Weakness: Secondary | ICD-10-CM | POA: Diagnosis present

## 2022-11-06 DIAGNOSIS — Z1152 Encounter for screening for COVID-19: Secondary | ICD-10-CM | POA: Diagnosis not present

## 2022-11-06 DIAGNOSIS — I509 Heart failure, unspecified: Secondary | ICD-10-CM | POA: Insufficient documentation

## 2022-11-06 DIAGNOSIS — N19 Unspecified kidney failure: Secondary | ICD-10-CM

## 2022-11-06 LAB — CBC WITH DIFFERENTIAL/PLATELET
Abs Immature Granulocytes: 0.02 10*3/uL (ref 0.00–0.07)
Basophils Absolute: 0 10*3/uL (ref 0.0–0.1)
Basophils Relative: 1 %
Eosinophils Absolute: 0.1 10*3/uL (ref 0.0–0.5)
Eosinophils Relative: 1 %
HCT: 33.1 % — ABNORMAL LOW (ref 36.0–46.0)
Hemoglobin: 10.5 g/dL — ABNORMAL LOW (ref 12.0–15.0)
Immature Granulocytes: 0 %
Lymphocytes Relative: 10 %
Lymphs Abs: 0.5 10*3/uL — ABNORMAL LOW (ref 0.7–4.0)
MCH: 25.3 pg — ABNORMAL LOW (ref 26.0–34.0)
MCHC: 31.7 g/dL (ref 30.0–36.0)
MCV: 79.8 fL — ABNORMAL LOW (ref 80.0–100.0)
Monocytes Absolute: 0.4 10*3/uL (ref 0.1–1.0)
Monocytes Relative: 8 %
Neutro Abs: 3.9 10*3/uL (ref 1.7–7.7)
Neutrophils Relative %: 80 %
Platelets: 120 10*3/uL — ABNORMAL LOW (ref 150–400)
RBC: 4.15 MIL/uL (ref 3.87–5.11)
RDW: 18.3 % — ABNORMAL HIGH (ref 11.5–15.5)
WBC: 5 10*3/uL (ref 4.0–10.5)
nRBC: 0 % (ref 0.0–0.2)

## 2022-11-06 LAB — BASIC METABOLIC PANEL
Anion gap: 15 (ref 5–15)
BUN: 84 mg/dL — ABNORMAL HIGH (ref 8–23)
CO2: 22 mmol/L (ref 22–32)
Calcium: 8.2 mg/dL — ABNORMAL LOW (ref 8.9–10.3)
Chloride: 101 mmol/L (ref 98–111)
Creatinine, Ser: 9.12 mg/dL — ABNORMAL HIGH (ref 0.44–1.00)
GFR, Estimated: 4 mL/min — ABNORMAL LOW (ref >60)
Glucose, Bld: 88 mg/dL (ref 70–99)
Potassium: 4.4 mmol/L (ref 3.5–5.1)
Sodium: 138 mmol/L (ref 135–145)

## 2022-11-06 LAB — HEPATIC FUNCTION PANEL
ALT: 26 U/L (ref 0–44)
AST: 31 U/L (ref 15–41)
Albumin: 3.6 g/dL (ref 3.5–5.0)
Alkaline Phosphatase: 109 U/L (ref 38–126)
Bilirubin, Direct: 0.2 mg/dL (ref 0.0–0.2)
Indirect Bilirubin: 0.5 mg/dL (ref 0.3–0.9)
Total Bilirubin: 0.7 mg/dL (ref 0.3–1.2)
Total Protein: 7.5 g/dL (ref 6.5–8.1)

## 2022-11-06 LAB — RESP PANEL BY RT-PCR (RSV, FLU A&B, COVID)  RVPGX2
Influenza A by PCR: NEGATIVE
Influenza B by PCR: NEGATIVE
Resp Syncytial Virus by PCR: NEGATIVE
SARS Coronavirus 2 by RT PCR: NEGATIVE

## 2022-11-06 LAB — ETHANOL: Alcohol, Ethyl (B): <10 mg/dL (ref <10)

## 2022-11-06 LAB — HEPATITIS B SURFACE ANTIGEN: Hepatitis B Surface Ag: NONREACTIVE

## 2022-11-06 LAB — AMMONIA: Ammonia: 59 umol/L — ABNORMAL HIGH (ref 9–35)

## 2022-11-06 MED ORDER — LIDOCAINE HCL (PF) 1 % IJ SOLN: 5.0000 mL | INTRAMUSCULAR | Status: DC | PRN

## 2022-11-06 MED ORDER — HEPARIN SODIUM (PORCINE) 1000 UNIT/ML DIALYSIS
1000.0000 [IU] | INTRAMUSCULAR | Status: DC | PRN
  Filled 2022-11-06: qty 1

## 2022-11-06 MED ORDER — CHLORHEXIDINE GLUCONATE CLOTH 2 % EX PADS: 6.0000 | MEDICATED_PAD | Freq: Every day | CUTANEOUS | Status: DC

## 2022-11-06 MED ORDER — ANTICOAGULANT SODIUM CITRATE 4% (200MG/5ML) IV SOLN
5.0000 mL | Status: DC | PRN
  Filled 2022-11-06: qty 5

## 2022-11-06 MED ORDER — LIDOCAINE-PRILOCAINE 2.5-2.5 % EX CREA
1.0000 | TOPICAL_CREAM | CUTANEOUS | Status: DC | PRN
  Filled 2022-11-06: qty 5

## 2022-11-06 MED ORDER — PENTAFLUOROPROP-TETRAFLUOROETH EX AERO
1.0000 | INHALATION_SPRAY | CUTANEOUS | Status: DC | PRN
  Filled 2022-11-06: qty 30

## 2022-11-06 MED ORDER — DIPHENHYDRAMINE HCL 50 MG/ML IJ SOLN
50.0000 mg | Freq: Once | INTRAMUSCULAR | Status: AC
  Administered 2022-11-06: 50 mg via INTRAVENOUS
  Filled 2022-11-06: qty 1

## 2022-11-06 MED ORDER — ALTEPLASE 2 MG IJ SOLR: 2.0000 mg | Freq: Once | INTRAMUSCULAR | Status: DC | PRN

## 2022-11-06 NOTE — ED Notes (Signed)
RN reached out to phlebotomy for hepatitis labs
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
## 2022-11-06 NOTE — ED Triage Notes (Signed)
Pt BIB EMS from dialysis center due to lethargy and flu like sx, Pt has had cough and sore throat for a couple of days. Pt did not receive any dialysis today, last session was Saturday. Dialysis center states pt is not compliant with dialysis. Axox4. Pt hypertensive at baseline.

## 2022-11-07 DIAGNOSIS — I132 Hypertensive heart and chronic kidney disease with heart failure and with stage 5 chronic kidney disease, or end stage renal disease: Secondary | ICD-10-CM | POA: Diagnosis not present

## 2022-11-07 LAB — HEPATITIS B SURFACE ANTIBODY, QUANTITATIVE: Hep B S AB Quant (Post): >1000 m[IU]/mL (ref >9.9)

## 2022-11-07 NOTE — ED Notes (Signed)
Ptar called 

## 2022-11-07 NOTE — ED Provider Notes (Signed)
Returned from HD. VS stable on baseline 2LNC.  The patient appears reasonably screened and/or stabilized for discharge and I doubt any other medical condition or other [facility] requiring further screening, evaluation, or treatment in the ED at this time. I have discussed the findings, Dx and Tx plan with the patient/family who expressed understanding and agree(s) with the plan. Discharge instructions discussed at length. The patient/family was given strict return precautions who verbalized understanding of the instructions. No further questions at time of discharge.  Disposition: Discharge  Condition: Good  ED Discharge Orders     None          Lamontae Ricardo, Grayce Sessions, MD 11/07/22 [phone]

## 2022-11-07 NOTE — Progress Notes (Signed)
Received patient in bed to unit.  Alert and oriented.  Informed consent signed and in chart.   TX duration:3 hours  Patient tolerated well.  Transported back to the room  Alert, without acute distress.  Hand-off given to patient's nurse.   Access used: 2007 Access issues: 2352  Total UF removed: 2000 Medication(s) given: benadryl 50mg /ml Post HD VS: see table below Post HD weight: 82.9kg    11/07/22 0000  Vitals  BP (!) 193/109  MAP (mmHg) 133  BP Location Right Arm  BP Method Automatic  Patient Position (if appropriate) Lying  Pulse Rate 81  Pulse Rate Source Monitor  ECG Heart Rate 80  Resp (!) 26  Oxygen Therapy  SpO2 100 %  O2 Device Nasal Cannula  O2 Flow Rate (L/min) 2 L/min  Patient Activity (if Appropriate) In bed  Pulse Oximetry Type Continuous  During Treatment Monitoring  HD Safety Checks Performed Yes  Intra-Hemodialysis Comments Tolerated well  Post Treatment  Dialyzer Clearance Clear  Duration of HD Treatment -hour(s) 3 hour(s)  Hemodialysis Intake (mL) 200 mL  Liters Processed 97.9  Fluid Removed (mL) 2000 mL  Tolerated HD Treatment Yes  Post-Hemodialysis Comments goal met  AVG/AVF Arterial Site Held (minutes) 8 minutes  AVG/AVF Venous Site Held (minutes) 9 minutes  Fistula / Graft Left Upper arm Arteriovenous fistula  Placement Date/Time: 08/11/21 0836   Orientation: Left  Access Location: Upper arm  Access Type: Arteriovenous fistula  Site Condition No complications  Fistula / Graft Assessment Present;Thrill;Bruit  Status Flushed;Deaccessed      [facility]

## 2022-11-10 ENCOUNTER — Ambulatory Visit: Payer: Medicare HMO

## 2022-11-12 ENCOUNTER — Ambulatory Visit (INDEPENDENT_AMBULATORY_CARE_PROVIDER_SITE_OTHER): Payer: Medicare HMO | Admitting: Physician Assistant

## 2022-11-12 ENCOUNTER — Other Ambulatory Visit: Payer: Self-pay

## 2022-11-12 ENCOUNTER — Encounter: Payer: Self-pay | Admitting: Physician Assistant

## 2022-11-12 VITALS — BP 176/89 | HR 87 | Temp 98.6°F | Resp 20

## 2022-11-12 DIAGNOSIS — Z992 Dependence on renal dialysis: Secondary | ICD-10-CM | POA: Diagnosis not present

## 2022-11-12 DIAGNOSIS — N186 End stage renal disease: Secondary | ICD-10-CM

## 2022-11-12 MED ORDER — SODIUM CHLORIDE 0.9 % IV SOLN: 250.0000 mL | INTRAVENOUS | Status: DC | PRN

## 2022-11-12 MED ORDER — SODIUM CHLORIDE 0.9% FLUSH: 3.0000 mL | Freq: Two times a day (BID) | INTRAVENOUS | Status: DC
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
## 2022-11-17 ENCOUNTER — Ambulatory Visit (HOSPITAL_COMMUNITY)
Admission: RE | Disposition: A | Discharge status: Home / Self Care | Payer: Self-pay | Source: Home / Self Care | Attending: Vascular Surgery

## 2022-11-17 ENCOUNTER — Ambulatory Visit (HOSPITAL_COMMUNITY)
Admission: RE | Admit: 2022-11-17 | Discharge: 2022-11-17 | Disposition: A | Discharge status: Home / Self Care | Payer: Medicare HMO | Attending: Vascular Surgery | Admitting: Vascular Surgery

## 2022-11-17 DIAGNOSIS — Z992 Dependence on renal dialysis: Secondary | ICD-10-CM | POA: Insufficient documentation

## 2022-11-17 DIAGNOSIS — T82858A Stenosis of vascular prosthetic devices, implants and grafts, initial encounter: Secondary | ICD-10-CM | POA: Insufficient documentation

## 2022-11-17 DIAGNOSIS — T82898A Other specified complication of vascular prosthetic devices, implants and grafts, initial encounter: Secondary | ICD-10-CM

## 2022-11-17 DIAGNOSIS — I120 Hypertensive chronic kidney disease with stage 5 chronic kidney disease or end stage renal disease: Secondary | ICD-10-CM | POA: Insufficient documentation

## 2022-11-17 DIAGNOSIS — N186 End stage renal disease: Secondary | ICD-10-CM | POA: Diagnosis not present

## 2022-11-17 DIAGNOSIS — Z7901 Long term (current) use of anticoagulants: Secondary | ICD-10-CM | POA: Diagnosis not present

## 2022-11-17 DIAGNOSIS — Z87891 Personal history of nicotine dependence: Secondary | ICD-10-CM | POA: Insufficient documentation

## 2022-11-17 DIAGNOSIS — I4891 Unspecified atrial fibrillation: Secondary | ICD-10-CM | POA: Diagnosis not present

## 2022-11-17 DIAGNOSIS — Y841 Kidney dialysis as the cause of abnormal reaction of the patient, or of later complication, without mention of misadventure at the time of the procedure: Secondary | ICD-10-CM | POA: Insufficient documentation

## 2022-11-17 DIAGNOSIS — N185 Chronic kidney disease, stage 5: Secondary | ICD-10-CM

## 2022-11-17 HISTORY — PX: PERIPHERAL VASCULAR INTERVENTION: CATH118257

## 2022-11-17 HISTORY — PX: A/V FISTULAGRAM: CATH118298

## 2022-11-17 LAB — POCT I-STAT, CHEM 8
BUN: 45 mg/dL — ABNORMAL HIGH (ref 8–23)
Calcium, Ion: 1.09 mmol/L — ABNORMAL LOW (ref 1.15–1.40)
Chloride: 97 mmol/L — ABNORMAL LOW (ref 98–111)
Creatinine, Ser: 6.2 mg/dL — ABNORMAL HIGH (ref 0.44–1.00)
Glucose, Bld: 75 mg/dL (ref 70–99)
HCT: 29 % — ABNORMAL LOW (ref 36.0–46.0)
Hemoglobin: 9.9 g/dL — ABNORMAL LOW (ref 12.0–15.0)
Potassium: 3.8 mmol/L (ref 3.5–5.1)
Sodium: 139 mmol/L (ref 135–145)
TCO2: 34 mmol/L — ABNORMAL HIGH (ref 22–32)

## 2022-11-17 SURGERY — A/V FISTULAGRAM
Anesthesia: LOCAL | Laterality: Left

## 2022-11-17 MED ORDER — LIDOCAINE HCL (PF) 1 % IJ SOLN
INTRAMUSCULAR | Status: AC
  Filled 2022-11-17: qty 30

## 2022-11-17 MED ORDER — MIDAZOLAM HCL 5 MG/5ML IJ SOLN
INTRAMUSCULAR | Status: AC
  Filled 2022-11-17: qty 5

## 2022-11-17 MED ORDER — HEPARIN (PORCINE) IN NACL 1000-0.9 UT/500ML-% IV SOLN
INTRAVENOUS | Status: DC | PRN
  Administered 2022-11-17: 500 mL

## 2022-11-17 MED ORDER — LIDOCAINE HCL (PF) 1 % IJ SOLN
INTRAMUSCULAR | Status: DC | PRN
  Administered 2022-11-17 (×2): 2 mL

## 2022-11-17 MED ORDER — FENTANYL CITRATE (PF) 100 MCG/2ML IJ SOLN
INTRAMUSCULAR | Status: DC | PRN
  Administered 2022-11-17: 25 ug via INTRAVENOUS

## 2022-11-17 MED ORDER — SODIUM CHLORIDE 0.9% FLUSH: 3.0000 mL | INTRAVENOUS | Status: DC | PRN

## 2022-11-17 MED ORDER — FENTANYL CITRATE (PF) 100 MCG/2ML IJ SOLN
INTRAMUSCULAR | Status: AC
  Filled 2022-11-17: qty 2

## 2022-11-17 MED ORDER — IODIXANOL 320 MG/ML IV SOLN
INTRAVENOUS | Status: DC | PRN
  Administered 2022-11-17: 100 mL

## 2022-11-17 SURGICAL SUPPLY — 17 items
BAG SNAP BAND KOVER 36X36 (MISCELLANEOUS) ×3 IMPLANT
BALLN MUSTANG 7.0X20 75 (BALLOONS) ×2
BALLN MUSTANG 8.0X40 75 (BALLOONS) ×2
BALLOON MUSTANG 7.0X20 75 (BALLOONS) IMPLANT
BALLOON MUSTANG 8.0X40 75 (BALLOONS) IMPLANT
COVER DOME SNAP 22 D (MISCELLANEOUS) ×3 IMPLANT
KIT ENCORE 26 ADVANTAGE (KITS) IMPLANT
KIT MICROPUNCTURE NIT STIFF (SHEATH) IMPLANT
PROTECTION STATION PRESSURIZED (MISCELLANEOUS) ×4
SHEATH PINNACLE R/O II 6F 4CM (SHEATH) IMPLANT
SHEATH PROBE COVER 6X72 (BAG) ×3 IMPLANT
STATION PROTECTION PRESSURIZED (MISCELLANEOUS) ×3 IMPLANT
STOPCOCK MORSE 400PSI 3WAY (MISCELLANEOUS) ×3 IMPLANT
TRAY PV CATH (CUSTOM PROCEDURE TRAY) ×3 IMPLANT
TUBING CIL FLEX 10 FLL-RA (TUBING) ×3 IMPLANT
WIRE STARTER BENTSON 035X150 (WIRE) IMPLANT
WIRE TORQFLEX AUST .018X40CM (WIRE) IMPLANT

## 2022-11-17 NOTE — Op Note (Signed)
    Patient name: Audrey Hill MRN: [id number] DOB: January 02, 1956 Sex: female  11/17/2022 Pre-operative Diagnosis: Fistula malfunction Post-operative diagnosis:  Same Surgeon:  Broadus John, MD Procedure Performed: 1.  Left arm brachiocephalic fistulogram 2.  8 mm x 40 mm balloon venoplasty at the mid-humerus   Indications: Patient is a 67 year old female with end-stage renal disease who underwent left-sided brachiocephalic fistula creation in 2022.  She has had no issues until recently, when she noted difficulty with cannulation at dialysis and also noted pain during dialysis involving the entirety of her left arm.  She has had waxing and waning numbness, but has a palpable pulse in the level of the wrist.  Findings: Widely patent left brachiocephalic fistula with area of 70% stenosis at the midhumerus.  This was balloon venoplastied using an 8 mm x 40 mm balloon to less than 30%   Procedure: Patient was brought to the Cath Lab in the supine position.  The left arm was prepped and draped in standard fashion and timeout was performed.  The fistula was accessed under direct visualization, and fistulogram followed.  This showed widely patent fistula with area of focal stenosis in the mid humerus.  I elected to treat this lesion using an 8 x 40 mm balloon.  This was inflated for 3 minutes.  Follow-up angiography demonstrated improvement, therefore I elected to reinflate the balloon to a higher pressure of 14 atm.  Follow-up angiography demonstrated significant proved with resolution of stenosis to less than 30%.  There is collaterals appear to fill less.  [name]'s presentation was unique as she noted fistula malfunction at dialysis, but also noted some waxing waning numbness concerning for steal syndrome.  Interestingly, she had a palpable pulse at the level of the wrist, with warm fingers throughout but no wounds.  Should wounds or rest pain occur, she may benefit from still syndrome workup.   At this time, I recommend continuing to use the fistula with as needed follow-up.   [name], MD [facility] Office: [phone]
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
## 2022-11-18 ENCOUNTER — Encounter (HOSPITAL_COMMUNITY): Payer: Self-pay | Admitting: Vascular Surgery

## 2022-11-18 MED FILL — Midazolam HCl Inj 5 MG/5ML (Base Equivalent): INTRAMUSCULAR | Qty: 5 | Status: AC

## 2022-11-19 ENCOUNTER — Encounter (HOSPITAL_COMMUNITY): Payer: Self-pay | Admitting: Vascular Surgery

## 2022-11-23 ENCOUNTER — Encounter: Payer: Medicare HMO | Admitting: Student

## 2022-12-02 ENCOUNTER — Emergency Department (HOSPITAL_COMMUNITY): Payer: Medicare HMO

## 2022-12-02 ENCOUNTER — Emergency Department (HOSPITAL_COMMUNITY)
Admission: EM | Admit: 2022-12-02 | Discharge: 2022-12-02 | Disposition: A | Discharge status: Home / Self Care | Payer: Medicare HMO | Attending: Emergency Medicine | Admitting: Emergency Medicine

## 2022-12-02 ENCOUNTER — Other Ambulatory Visit: Payer: Self-pay

## 2022-12-02 ENCOUNTER — Encounter (HOSPITAL_COMMUNITY): Payer: Self-pay

## 2022-12-02 ENCOUNTER — Encounter: Payer: Medicare HMO | Admitting: Student

## 2022-12-02 DIAGNOSIS — J22 Unspecified acute lower respiratory infection: Secondary | ICD-10-CM | POA: Diagnosis not present

## 2022-12-02 DIAGNOSIS — Z79899 Other long term (current) drug therapy: Secondary | ICD-10-CM | POA: Diagnosis not present

## 2022-12-02 DIAGNOSIS — I132 Hypertensive heart and chronic kidney disease with heart failure and with stage 5 chronic kidney disease, or end stage renal disease: Secondary | ICD-10-CM | POA: Insufficient documentation

## 2022-12-02 DIAGNOSIS — Z992 Dependence on renal dialysis: Secondary | ICD-10-CM | POA: Insufficient documentation

## 2022-12-02 DIAGNOSIS — Z1152 Encounter for screening for COVID-19: Secondary | ICD-10-CM | POA: Insufficient documentation

## 2022-12-02 DIAGNOSIS — N186 End stage renal disease: Secondary | ICD-10-CM | POA: Diagnosis not present

## 2022-12-02 DIAGNOSIS — H1032 Unspecified acute conjunctivitis, left eye: Secondary | ICD-10-CM | POA: Insufficient documentation

## 2022-12-02 DIAGNOSIS — I509 Heart failure, unspecified: Secondary | ICD-10-CM | POA: Insufficient documentation

## 2022-12-02 DIAGNOSIS — R059 Cough, unspecified: Secondary | ICD-10-CM | POA: Diagnosis present

## 2022-12-02 LAB — COMPREHENSIVE METABOLIC PANEL
ALT: 25 U/L (ref 0–44)
AST: 31 U/L (ref 15–41)
Albumin: 2.8 g/dL — ABNORMAL LOW (ref 3.5–5.0)
Alkaline Phosphatase: 122 U/L (ref 38–126)
Anion gap: 13 (ref 5–15)
BUN: 31 mg/dL — ABNORMAL HIGH (ref 8–23)
CO2: 29 mmol/L (ref 22–32)
Calcium: 8.4 mg/dL — ABNORMAL LOW (ref 8.9–10.3)
Chloride: 96 mmol/L — ABNORMAL LOW (ref 98–111)
Creatinine, Ser: 4.9 mg/dL — ABNORMAL HIGH (ref 0.44–1.00)
GFR, Estimated: 9 mL/min — ABNORMAL LOW (ref >60)
Glucose, Bld: 128 mg/dL — ABNORMAL HIGH (ref 70–99)
Potassium: 3.5 mmol/L (ref 3.5–5.1)
Sodium: 138 mmol/L (ref 135–145)
Total Bilirubin: 0.7 mg/dL (ref 0.3–1.2)
Total Protein: 7 g/dL (ref 6.5–8.1)

## 2022-12-02 LAB — CBC WITH DIFFERENTIAL/PLATELET
Abs Immature Granulocytes: 0.03 10*3/uL (ref 0.00–0.07)
Basophils Absolute: 0.1 10*3/uL (ref 0.0–0.1)
Basophils Relative: 1 %
Eosinophils Absolute: 0.1 10*3/uL (ref 0.0–0.5)
Eosinophils Relative: 1 %
HCT: 32.3 % — ABNORMAL LOW (ref 36.0–46.0)
Hemoglobin: 10 g/dL — ABNORMAL LOW (ref 12.0–15.0)
Immature Granulocytes: 0 %
Lymphocytes Relative: 8 %
Lymphs Abs: 0.6 10*3/uL — ABNORMAL LOW (ref 0.7–4.0)
MCH: 25.6 pg — ABNORMAL LOW (ref 26.0–34.0)
MCHC: 31 g/dL (ref 30.0–36.0)
MCV: 82.6 fL (ref 80.0–100.0)
Monocytes Absolute: 0.9 10*3/uL (ref 0.1–1.0)
Monocytes Relative: 12 %
Neutro Abs: 6.1 10*3/uL (ref 1.7–7.7)
Neutrophils Relative %: 78 %
Platelets: 181 10*3/uL (ref 150–400)
RBC: 3.91 MIL/uL (ref 3.87–5.11)
RDW: 19.8 % — ABNORMAL HIGH (ref 11.5–15.5)
WBC: 7.8 10*3/uL (ref 4.0–10.5)
nRBC: 0 % (ref 0.0–0.2)

## 2022-12-02 LAB — RESP PANEL BY RT-PCR (RSV, FLU A&B, COVID)  RVPGX2
Influenza A by PCR: NEGATIVE
Influenza B by PCR: NEGATIVE
Resp Syncytial Virus by PCR: NEGATIVE
SARS Coronavirus 2 by RT PCR: NEGATIVE

## 2022-12-02 LAB — LACTIC ACID, PLASMA: Lactic Acid, Venous: 1.5 mmol/L (ref 0.5–1.9)

## 2022-12-02 MED ORDER — AMOXICILLIN-POT CLAVULANATE 500-125 MG PO TABS: 1.0000 | ORAL_TABLET | Freq: Every day | ORAL | 0 refills | Status: AC

## 2022-12-02 MED ORDER — AMOXICILLIN-POT CLAVULANATE 500-125 MG PO TABS
1.0000 | ORAL_TABLET | Freq: Every day | ORAL | Status: AC
  Administered 2022-12-02: 1 via ORAL
  Filled 2022-12-02: qty 1

## 2022-12-02 MED ORDER — ERYTHROMYCIN 5 MG/GM OP OINT
1.0000 | TOPICAL_OINTMENT | Freq: Once | OPHTHALMIC | Status: AC
  Administered 2022-12-02: 1 via OPHTHALMIC
  Filled 2022-12-02: qty 3.5

## 2022-12-02 MED ORDER — DOXYCYCLINE HYCLATE 100 MG PO TABS
100.0000 mg | ORAL_TABLET | Freq: Once | ORAL | Status: AC
  Administered 2022-12-02: 100 mg via ORAL
  Filled 2022-12-02: qty 1

## 2022-12-02 MED ORDER — BENZONATATE 100 MG PO CAPS: 100.0000 mg | ORAL_CAPSULE | Freq: Three times a day (TID) | ORAL | 0 refills | Status: DC

## 2022-12-02 MED ORDER — AZITHROMYCIN 250 MG PO TABS: 500.0000 mg | ORAL_TABLET | Freq: Every day | ORAL | Status: DC

## 2022-12-02 MED ORDER — AZITHROMYCIN 250 MG PO TABS: 250.0000 mg | ORAL_TABLET | Freq: Every day | ORAL | 0 refills | Status: DC

## 2022-12-02 MED ORDER — IPRATROPIUM-ALBUTEROL 0.5-2.5 (3) MG/3ML IN SOLN
3.0000 mL | Freq: Once | RESPIRATORY_TRACT | Status: AC
  Administered 2022-12-02: 3 mL via RESPIRATORY_TRACT
  Filled 2022-12-02: qty 3

## 2022-12-02 MED ORDER — DOXYCYCLINE HYCLATE 100 MG PO CAPS: 100.0000 mg | ORAL_CAPSULE | Freq: Two times a day (BID) | ORAL | 0 refills | Status: AC

## 2022-12-02 NOTE — Discharge Instructions (Addendum)
You were seen today for cough and green sputum, your chest xray shows that you may have some fluid or possibly underlying infection. We are treating you for possible pneumonia, but it is very important that you follow up with dialysis on Saturday and do the full treatment. Come back if you have fever, trouble breathing, or any other worsening symptoms.  You also have an infection in your eye. Use the ointment 4 times per day for a week.  You were given Augmentin to take once a day and doxycycline to take once a day as well.    Do not pick up the azithromycin at the pharmacy as this was sent in error.  Tried to call the pharmacy to cancel it but there was no answer or option to leave a message.

## 2022-12-02 NOTE — ED Notes (Signed)
Was only able to obtain 1 set of cultures. PA made aware.

## 2022-12-02 NOTE — ED Triage Notes (Signed)
Pt BIB [facility] from dialysis center where she was receiving her dialysis treatment . Pt was half way through her treatment then started started c/o feeling sick overall. Pt reported this has been an ongoing problem for over 2 weeks. She feels congested, weak along with cough. A&O X4.

## 2022-12-02 NOTE — ED Notes (Signed)
Patient ambulated in hallway with minimal assistance. Patient's O2 was 95% consistently. No complaints of light-headedness or dizziness. RN is aware.

## 2022-12-02 NOTE — ED Provider Notes (Signed)
[facility] Provider Note   CSN: [id number] Arrival date & time: 12/02/22  1525     History  Chief Complaint  Patient presents with   Fatigue    Audrey Hill is a 67 y.o. female.  She has past medical history of ESRD on hemodialysis Thursday Saturday with history of not completing full dialysis sessions.  Also has history of congestive heart failure, hypertension. She is brought in today via EMS from dialysis after about completing 2 hours of her session.  She complains that she been having cough productive of of green sputum for 3 days with shortness of breath, along with nausea and vomiting.  She denies diarrhea, no fevers.  Denies chest pain.  She is having headache and congestion as well and woke up this morning with copious amounts of green drainage from the left eye and redness to the left eye.  She denies pain or visual changes.    Reports history of smoking previously but has not smoked in about 15 years.  He does occasionally need to use albuterol inhaler at home.  HPI     Home Medications Prior to Admission medications   Medication Sig Start Date End Date Taking? Authorizing Provider  amoxicillin-clavulanate (AUGMENTIN) 500-125 MG tablet Take 1 tablet by mouth daily at 12 noon for 4 days. 12/03/22 12/07/22 Yes Jeilyn Reznik A, PA-C  benzonatate (TESSALON) 100 MG capsule Take 1 capsule (100 mg total) by mouth every 8 (eight) hours. 12/02/22  Yes Oneill Bais A, PA-C  doxycycline (VIBRAMYCIN) 100 MG capsule Take 1 capsule (100 mg total) by mouth 2 (two) times daily for 5 days. 12/02/22 12/07/22 Yes Bravery Ketcham A, PA-C  albuterol (VENTOLIN HFA) 108 (90 Base) MCG/ACT inhaler Inhale 2 puffs into the lungs every 6 (six) hours as needed (Cough).    [provider]  amiodarone (PACERONE) 200 MG tablet Take 200 mg by mouth daily. 06/21/22   [provider]  apixaban (ELIQUIS) 5 MG TABS tablet Take 1 tablet (5 mg  total) by mouth 2 (two) times daily. Patient taking differently: Take 2.5 mg by mouth 2 (two) times daily. 08/11/22 11/15/22  Atway, Rayann N, DO  atorvastatin (LIPITOR) 20 MG tablet TAKE 1 TABLET BY MOUTH DAILY 09/30/22   Lacinda Axon, MD  calcium acetate (PHOSLO) 667 MG tablet Take 667 mg by mouth with breakfast, with lunch, and with evening meal. Take 1 tablet with a snack 07/17/22   [provider]  carvedilol (COREG) 12.5 MG tablet Take 1 tablet (12.5 mg total) by mouth 2 (two) times daily with a meal. 08/11/22 11/17/22  Camelia Phenes, MD  Darbepoetin Alfa (ARANESP) 100 MCG/0.5ML SOSY injection Inject 0.5 mLs (100 mcg total) into the vein every Saturday with hemodialysis. 08/15/21   Lacinda Axon, MD  dextromethorphan 15 MG/5ML syrup Take 10 mLs (30 mg total) by mouth 3 (three) times daily as needed for cough. 10/30/22   Jeanell Sparrow, DO  furosemide (LASIX) 80 MG tablet Take 80 mg by mouth See admin instructions. Take 1 tablet by mouth on your non-dialysis days (Sunday, Monday, Wednesday, Friday) 06/21/22   [provider]  gabapentin (NEURONTIN) 300 MG capsule Take 300 mg by mouth in the morning. 07/28/22   [provider]  guaiFENesin (ROBITUSSIN) 100 MG/5ML liquid Take 5 mLs by mouth every 4 (four) hours as needed for cough or to loosen phlegm. 08/11/22   Camelia Phenes, MD  hydrALAZINE (APRESOLINE) 25 MG tablet  Take 3 tablets (75 mg total) by mouth every 8 (eight) hours. Patient taking differently: Take 25 mg by mouth 2 (two) times daily. 08/11/22 11/17/22  Camelia Phenes, MD  isosorbide dinitrate (ISORDIL) 20 MG tablet Take 20 mg by mouth 2 (two) times daily. 07/28/22   [provider]  lidocaine (LIDODERM) 5 % Place 1 patch onto the skin daily as needed. Remove & Discard patch within 12 hours or as directed by MD 10/30/22   Wynona Dove A, DO  losartan (COZAAR) 100 MG tablet TAKE 1 TABLET BY MOUTH EVERY DAY 10/21/22   Leigh Aurora, DO  melatonin 3 MG TABS  tablet Take 3 mg by mouth at bedtime.    [provider]  mirtazapine (REMERON) 7.5 MG tablet Take 1 tablet (7.5 mg total) by mouth at bedtime for 14 days. 10/30/22 11/17/22  Jeanell Sparrow, DO  multivitamin (RENA-VIT) TABS tablet Take 1 tablet by mouth at bedtime. 08/12/21   Lacinda Axon, MD  pantoprazole (PROTONIX) 40 MG tablet Take 1 tablet (40 mg total) by mouth 2 (two) times daily. Patient taking differently: Take 40 mg by mouth daily. 07/19/22   Idamae Schuller, MD  traZODone (DESYREL) 50 MG tablet Take 1 tablet (50 mg total) by mouth at bedtime. 08/11/22 11/17/22  Camelia Phenes, MD  triamcinolone ointment (KENALOG) 0.1 % Apply 1 Application topically 2 (two) times daily. 11/17/21   [provider]  promethazine (PHENERGAN) 12.5 MG suppository Place 1 suppository (12.5 mg total) rectally every 8 (eight) hours as needed. Patient not taking: Reported on 11/13/2014 09/15/14 06/18/15  Palumbo, April, MD      Allergies    Lisinopril, Metoclopramide, Nortriptyline, Tramadol, Acetaminophen, Nitroglycerin, Ibuprofen, Norvasc [amlodipine besylate], and Nsaids    Review of Systems   Review of Systems  Physical Exam Updated Vital Signs BP (!) 158/89   Pulse 82   Temp 98.2 F (36.8 C) (Oral)   Resp (!) 24   SpO2 92%  Physical Exam Vitals and nursing note reviewed.  Constitutional:      General: She is not in acute distress.    Appearance: She is well-developed.  HENT:     Head: Normocephalic and atraumatic.     Nose: Nose normal.     Mouth/Throat:     Mouth: Mucous membranes are moist.  Eyes:     General: Lids are normal.     Conjunctiva/sclera:     Right eye: Right conjunctiva is not injected. No chemosis, exudate or hemorrhage.    Left eye: Left conjunctiva is injected. Exudate present. No chemosis or hemorrhage. Cardiovascular:     Rate and Rhythm: Normal rate and regular rhythm.     Heart sounds: No murmur heard. Pulmonary:     Effort: Pulmonary effort is normal.  No respiratory distress.     Breath sounds: Normal breath sounds.  Abdominal:     Palpations: Abdomen is soft.     Tenderness: There is no abdominal tenderness.  Musculoskeletal:        General: No swelling.     Cervical back: Neck supple.  Skin:    General: Skin is warm and dry.     Capillary Refill: Capillary refill takes less than 2 seconds.  Neurological:     Mental Status: She is alert.  Psychiatric:        Mood and Affect: Mood normal.     ED Results / Procedures / Treatments   Labs (all labs ordered are listed, but only abnormal results are  displayed) Labs Reviewed  CBC WITH DIFFERENTIAL/PLATELET - Abnormal; Notable for the following components:      Result Value   Hemoglobin 10.0 (*)    HCT 32.3 (*)    MCH 25.6 (*)    RDW 19.8 (*)    Lymphs Abs 0.6 (*)    All other components within normal limits  COMPREHENSIVE METABOLIC PANEL - Abnormal; Notable for the following components:   Chloride 96 (*)    Glucose, Bld 128 (*)    BUN 31 (*)    Creatinine, Ser 4.90 (*)    Calcium 8.4 (*)    Albumin 2.8 (*)    GFR, Estimated 9 (*)    All other components within normal limits  RESP PANEL BY RT-PCR (RSV, FLU A&B, COVID)  RVPGX2  CULTURE, BLOOD (ROUTINE X 2)  CULTURE, BLOOD (ROUTINE X 2)  LACTIC ACID, PLASMA  LACTIC ACID, PLASMA    EKG EKG Interpretation  Date/Time:  Thursday December 02 2022 15:36:17 EST Ventricular Rate:  85 PR Interval:  162 QRS Duration: 113 QT Interval:  434 QTC Calculation: 517 R Axis:   -29 Text Interpretation: Sinus rhythm Ventricular premature complex LAE, consider biatrial enlargement LVH with secondary repolarization abnormality Anterior Q waves, possibly due to LVH ST depr, consider ischemia, inferior leads Borderline ST elevation, lateral leads Prolonged QT interval Confirmed by Dene Gentry [phone]) on 12/02/2022 3:52:33 PM  Radiology DG Chest Port 1 View  Result Date: 12/02/2022 CLINICAL DATA:  Cough EXAM: PORTABLE CHEST 1 VIEW  COMPARISON:  Radiograph 11/06/2022 FINDINGS: Unchanged enlarged cardiac silhouette. Pulmonary vascular congestion. Mild interstitial opacities. No large effusion or evidence of pneumothorax. Unchanged punctate metallic densities overlying the lower neck. Thoracic spondylosis. No acute osseous abnormality. IMPRESSION: Cardiomegaly with mild pulmonary edema. Electronically Signed   By: Maurine Simmering M.D.   On: 12/02/2022 16:03    Procedures Procedures    Medications Ordered in ED Medications  amoxicillin-clavulanate (AUGMENTIN) 500-125 MG per tablet 1 tablet (has no administration in time range)  doxycycline (VIBRA-TABS) tablet 100 mg (has no administration in time range)  ipratropium-albuterol (DUONEB) 0.5-2.5 (3) MG/3ML nebulizer solution 3 mL (3 mLs Nebulization Given 12/02/22 1705)  erythromycin ophthalmic ointment 1 Application (1 Application Left Eye Given 12/02/22 1832)    ED Course/ Medical Decision Making/ A&P                             Medical Decision Making This patient presents to the ED for concern of productive cough, congestion, headache and left eye drainage, this involves an extensive number of treatment options, and is a complaint that carries with it a high risk of complications and morbidity.  The differential diagnosis includes influenza, COVID, pneumonia, pulmonary edema, other   Co morbidities that complicate the patient evaluation  ESRD on dialysis Tuesday Thursday Saturday   Additional history obtained:  Additional history obtained from EMR External records from outside source obtained and reviewed including her ED visits and vascular surgery notes   Lab Tests:  I Ordered, and personally interpreted labs.  The pertinent results include: CBC, CMP, lactic acid and COVID flu and RSV swab are reassuring at baseline.  Get of COVID flu and RSV   Imaging Studies ordered:  I ordered imaging studies including chest x-ray I independently visualized and interpreted  imaging which showed additional infiltrates I agree with the radiologist interpretation   Cardiac Monitoring: / EKG:  The patient was maintained on a cardiac  monitor.  I personally viewed and interpreted the cardiac monitored which showed an underlying rhythm of: Sinus rhythm   Consultations Obtained:  I requested consultation with the pharmacist,  and discussed lab and imaging findings as well as pertinent plan - they recommend: Adjust azithromycin to 500 mg once a day for 5 days   Problem List / ED Course / Critical interventions / Medication management  Patient presented for evaluation of cough, congestion and left eye drainage for the past couple of days, dialyzed only 2 hours of her session today.  Electrolytes are reassuring she is not hyperkalemic.  Labs are at her baseline.  She is coughing up green sputum, satting between 92 and 95 on room air, able to ambulate on room air 95% without difficulty or shortness of breath.  I offered admission to the patient for pneumonia given her multiple comorbid conditions but patient wants to go home on oral antibiotics was given strict return precautions.  Discussed is important to follow-up with dialysis as the interstitial traits could be fluid more than infection, she was agreeable with this. She was given erythromycin for the conjunctivitis to the left eye, she is not a contact lens wearer, sent home with the tube and instructed use this 4 times a day for a week. Denies any eye pain or visual changes, pupils are equal and reactive, corneas are grossly clear, do not feel this represents glaucoma or other acute process.  She did not have any pain or injury.  Of note initially was going to give Augmentin and azithromycin but noted on patient's EKG she does have slightly prolonged QT and is on other medication to prolong QT so this was discontinued switch to doxycycline.  I attempted to call pharmacy to cancel this prescription but was unable to leave  a message or speak with pharmacy staff at this time, patient was informed to not pick this up and it was also written on her instructions.   I ordered medication including albuterol for wheezing Reevaluation of the patient after these medicines showed that the patient improved I have reviewed the patients home medicines and have made adjustments as needed   Social Determinants of Health:  Past history of substance use and history of missing dialysis   Test / Admission - Considered:  Consider admission for patient for pneumonia given she is complicated patient with ESRD on dialysis.  Patient declined this and wants to try outpatient treatment.  She did ambulate on room air satting 95% with no symptoms.  Return precautions    Amount and/or Complexity of Data Reviewed Labs: ordered. Radiology: ordered.  Risk Prescription drug management.           Final Clinical Impression(s) / ED Diagnoses Final diagnoses:  Lower respiratory tract infection  Acute bacterial conjunctivitis of left eye    Rx / DC Orders ED Discharge Orders          Ordered    amoxicillin-clavulanate (AUGMENTIN) 500-125 MG tablet  Daily        12/02/22 1842    azithromycin (ZITHROMAX) 250 MG tablet  Daily,   Status:  Discontinued        12/02/22 1842    doxycycline (VIBRAMYCIN) 100 MG capsule  2 times daily        12/02/22 1844    benzonatate (TESSALON) 100 MG capsule  Every 8 hours        12/02/22 1848  Darci Current 12/02/22 1906    Elgie Congo, MD 12/02/22 2351

## 2022-12-07 LAB — CULTURE, BLOOD (ROUTINE X 2): Culture: NO GROWTH

## 2022-12-08 ENCOUNTER — Encounter: Payer: Medicare HMO | Admitting: Student

## 2022-12-08 ENCOUNTER — Ambulatory Visit: Payer: Medicare HMO

## 2022-12-20 ENCOUNTER — Encounter: Payer: Medicare HMO | Admitting: Student

## 2022-12-24 ENCOUNTER — Other Ambulatory Visit: Payer: Self-pay | Admitting: Student

## 2022-12-29 ENCOUNTER — Other Ambulatory Visit: Payer: Self-pay | Admitting: Student

## 2023-01-04 ENCOUNTER — Telehealth: Payer: Self-pay | Admitting: Dietician

## 2023-01-04 NOTE — Telephone Encounter (Signed)
Calling to see if patient would like to continue care at our office . Call could not be completed as dialed.

## 2023-01-25 ENCOUNTER — Telehealth: Payer: Self-pay

## 2023-01-25 NOTE — Telephone Encounter (Signed)
[facility] is requesting a call back .Marland Kitchen Name is Audrey Hill  is calling in regards to her start date of care .Marland KitchenMarland [phone]

## 2023-01-26 ENCOUNTER — Telehealth: Payer: Self-pay | Admitting: *Deleted

## 2023-01-26 NOTE — Telephone Encounter (Signed)
Call from [place], RN [facility].  Out to start [facility] Nursing visits for patient Needs order for Nursing visits 1 time a week for 6 weeks. Education on meds and Disease Management Fluid overload .  PT referral for evaluation for Strengthening, Gait and Balance. Patient also is requesting a Rolator.   Would like to  get something for itching.  Sites have scabs per nurse are healing.  Patient is requesting a prescription for Oxycodone for her right hip pain.  Can be sent to the [facility] in [facility] listed as a preferred Pharmacy. Joyice Faster can be reached at [phone].

## 2023-01-26 NOTE — Telephone Encounter (Signed)
Returned call to Sheralyn Boatman, with [facility]. She wanted to let us know SOC will be today for [facility] skilled nursing, PT, and OT.

## 2023-01-28 NOTE — Telephone Encounter (Signed)
Can we please schedule her an appointment to come in for her itching and hip pain?

## 2023-01-28 NOTE — Telephone Encounter (Signed)
I agree. Patient can start the [facility] skilled RN, PT/OT.

## 2023-01-28 NOTE — Telephone Encounter (Signed)
I agree. Patient can start the [facility] skilled RN, PT/OT. He has an ppt next week so we can address the rest of her problems.

## 2023-01-28 NOTE — Telephone Encounter (Signed)
It looks like in the past she was referred to pain management

## 2023-01-29 ENCOUNTER — Emergency Department (HOSPITAL_COMMUNITY): Payer: Medicare HMO

## 2023-01-29 ENCOUNTER — Other Ambulatory Visit: Payer: Self-pay

## 2023-01-29 ENCOUNTER — Observation Stay (HOSPITAL_COMMUNITY)
Admission: EM | Admit: 2023-01-29 | Discharge: 2023-02-01 | Disposition: A | Discharge status: Home / Self Care | Payer: Medicare HMO | Attending: Internal Medicine | Admitting: Internal Medicine

## 2023-01-29 DIAGNOSIS — F329 Major depressive disorder, single episode, unspecified: Secondary | ICD-10-CM | POA: Diagnosis present

## 2023-01-29 DIAGNOSIS — G894 Chronic pain syndrome: Secondary | ICD-10-CM | POA: Diagnosis not present

## 2023-01-29 DIAGNOSIS — Z96652 Presence of left artificial knee joint: Secondary | ICD-10-CM | POA: Diagnosis not present

## 2023-01-29 DIAGNOSIS — J810 Acute pulmonary edema: Secondary | ICD-10-CM | POA: Diagnosis present

## 2023-01-29 DIAGNOSIS — R188 Other ascites: Secondary | ICD-10-CM | POA: Diagnosis not present

## 2023-01-29 DIAGNOSIS — N186 End stage renal disease: Secondary | ICD-10-CM | POA: Insufficient documentation

## 2023-01-29 DIAGNOSIS — R1013 Epigastric pain: Secondary | ICD-10-CM | POA: Diagnosis present

## 2023-01-29 DIAGNOSIS — R400 Somnolence: Secondary | ICD-10-CM | POA: Insufficient documentation

## 2023-01-29 DIAGNOSIS — Z7901 Long term (current) use of anticoagulants: Secondary | ICD-10-CM | POA: Diagnosis not present

## 2023-01-29 DIAGNOSIS — J449 Chronic obstructive pulmonary disease, unspecified: Secondary | ICD-10-CM | POA: Insufficient documentation

## 2023-01-29 DIAGNOSIS — J9690 Respiratory failure, unspecified, unspecified whether with hypoxia or hypercapnia: Secondary | ICD-10-CM

## 2023-01-29 DIAGNOSIS — E8770 Fluid overload, unspecified: Secondary | ICD-10-CM | POA: Diagnosis not present

## 2023-01-29 DIAGNOSIS — Z79899 Other long term (current) drug therapy: Secondary | ICD-10-CM | POA: Diagnosis not present

## 2023-01-29 DIAGNOSIS — R0902 Hypoxemia: Secondary | ICD-10-CM | POA: Diagnosis not present

## 2023-01-29 DIAGNOSIS — Z992 Dependence on renal dialysis: Secondary | ICD-10-CM | POA: Diagnosis not present

## 2023-01-29 DIAGNOSIS — E440 Moderate protein-calorie malnutrition: Secondary | ICD-10-CM | POA: Diagnosis present

## 2023-01-29 DIAGNOSIS — I132 Hypertensive heart and chronic kidney disease with heart failure and with stage 5 chronic kidney disease, or end stage renal disease: Secondary | ICD-10-CM | POA: Insufficient documentation

## 2023-01-29 DIAGNOSIS — I5023 Acute on chronic systolic (congestive) heart failure: Secondary | ICD-10-CM | POA: Diagnosis not present

## 2023-01-29 DIAGNOSIS — J9692 Respiratory failure, unspecified with hypercapnia: Secondary | ICD-10-CM | POA: Diagnosis not present

## 2023-01-29 DIAGNOSIS — I10 Essential (primary) hypertension: Secondary | ICD-10-CM | POA: Diagnosis present

## 2023-01-29 DIAGNOSIS — I5020 Unspecified systolic (congestive) heart failure: Secondary | ICD-10-CM | POA: Diagnosis present

## 2023-01-29 DIAGNOSIS — I4891 Unspecified atrial fibrillation: Secondary | ICD-10-CM | POA: Diagnosis present

## 2023-01-29 DIAGNOSIS — Z87891 Personal history of nicotine dependence: Secondary | ICD-10-CM

## 2023-01-29 DIAGNOSIS — R519 Headache, unspecified: Secondary | ICD-10-CM | POA: Diagnosis not present

## 2023-01-29 DIAGNOSIS — J9691 Respiratory failure, unspecified with hypoxia: Secondary | ICD-10-CM

## 2023-01-29 DIAGNOSIS — R109 Unspecified abdominal pain: Secondary | ICD-10-CM | POA: Diagnosis present

## 2023-01-29 LAB — COMPREHENSIVE METABOLIC PANEL
ALT: 32 U/L (ref 0–44)
AST: 47 U/L — ABNORMAL HIGH (ref 15–41)
Albumin: 2.9 g/dL — ABNORMAL LOW (ref 3.5–5.0)
Alkaline Phosphatase: 136 U/L — ABNORMAL HIGH (ref 38–126)
Anion gap: 12 (ref 5–15)
BUN: 27 mg/dL — ABNORMAL HIGH (ref 8–23)
CO2: 30 mmol/L (ref 22–32)
Calcium: 8.9 mg/dL (ref 8.9–10.3)
Chloride: 97 mmol/L — ABNORMAL LOW (ref 98–111)
Creatinine, Ser: 4.27 mg/dL — ABNORMAL HIGH (ref 0.44–1.00)
GFR, Estimated: 11 mL/min — ABNORMAL LOW (ref >60)
Glucose, Bld: 92 mg/dL (ref 70–99)
Potassium: 4 mmol/L (ref 3.5–5.1)
Sodium: 139 mmol/L (ref 135–145)
Total Bilirubin: 1.1 mg/dL (ref 0.3–1.2)
Total Protein: 6.9 g/dL (ref 6.5–8.1)

## 2023-01-29 LAB — CBC
HCT: 28.4 % — ABNORMAL LOW (ref 36.0–46.0)
Hemoglobin: 8.7 g/dL — ABNORMAL LOW (ref 12.0–15.0)
MCH: 26.6 pg (ref 26.0–34.0)
MCHC: 30.6 g/dL (ref 30.0–36.0)
MCV: 86.9 fL (ref 80.0–100.0)
Platelets: 212 10*3/uL (ref 150–400)
RBC: 3.27 MIL/uL — ABNORMAL LOW (ref 3.87–5.11)
RDW: 18.3 % — ABNORMAL HIGH (ref 11.5–15.5)
WBC: 5.2 10*3/uL (ref 4.0–10.5)
nRBC: 0 % (ref 0.0–0.2)

## 2023-01-29 LAB — I-STAT VENOUS BLOOD GAS, ED
Acid-Base Excess: 11 mmol/L — ABNORMAL HIGH (ref 0.0–2.0)
Acid-Base Excess: 12 mmol/L — ABNORMAL HIGH (ref 0.0–2.0)
Bicarbonate: 37.4 mmol/L — ABNORMAL HIGH (ref 20.0–28.0)
Bicarbonate: 38.6 mmol/L — ABNORMAL HIGH (ref 20.0–28.0)
Calcium, Ion: 1.13 mmol/L — ABNORMAL LOW (ref 1.15–1.40)
Calcium, Ion: 1.16 mmol/L (ref 1.15–1.40)
HCT: 27 % — ABNORMAL LOW (ref 36.0–46.0)
HCT: 28 % — ABNORMAL LOW (ref 36.0–46.0)
Hemoglobin: 9.2 g/dL — ABNORMAL LOW (ref 12.0–15.0)
Hemoglobin: 9.5 g/dL — ABNORMAL LOW (ref 12.0–15.0)
O2 Saturation: 100 %
O2 Saturation: 98 %
Potassium: 4.4 mmol/L (ref 3.5–5.1)
Potassium: 4.7 mmol/L (ref 3.5–5.1)
Sodium: 139 mmol/L (ref 135–145)
Sodium: 139 mmol/L (ref 135–145)
TCO2: 39 mmol/L — ABNORMAL HIGH (ref 22–32)
TCO2: 40 mmol/L — ABNORMAL HIGH (ref 22–32)
pCO2, Ven: 62.8 mmHg — ABNORMAL HIGH (ref 44–60)
pCO2, Ven: 61.4 mmHg — ABNORMAL HIGH (ref 44–60)
pH, Ven: 7.383 (ref 7.25–7.43)
pH, Ven: 7.406 (ref 7.25–7.43)
pO2, Ven: 193 mmHg — ABNORMAL HIGH (ref 32–45)
pO2, Ven: 105 mmHg — ABNORMAL HIGH (ref 32–45)

## 2023-01-29 LAB — HEPATITIS B SURFACE ANTIGEN: Hepatitis B Surface Ag: NONREACTIVE

## 2023-01-29 MED ORDER — CHLORHEXIDINE GLUCONATE CLOTH 2 % EX PADS
6.0000 | MEDICATED_PAD | Freq: Every day | CUTANEOUS | Status: DC
  Administered 2023-01-30 – 2023-02-01 (×3): 6 via TOPICAL

## 2023-01-29 MED ORDER — ISOSORBIDE DINITRATE 10 MG PO TABS
20.0000 mg | ORAL_TABLET | Freq: Two times a day (BID) | ORAL | Status: DC
  Administered 2023-01-30 – 2023-02-01 (×6): 20 mg via ORAL
  Filled 2023-01-29 (×6): qty 2

## 2023-01-29 MED ORDER — GABAPENTIN 300 MG PO CAPS
300.0000 mg | ORAL_CAPSULE | Freq: Every morning | ORAL | Status: DC
  Administered 2023-01-30 – 2023-02-01 (×3): 300 mg via ORAL
  Filled 2023-01-29 (×3): qty 1

## 2023-01-29 MED ORDER — PANTOPRAZOLE SODIUM 40 MG IV SOLR
40.0000 mg | Freq: Once | INTRAVENOUS | Status: AC
  Administered 2023-01-29: 40 mg via INTRAVENOUS
  Filled 2023-01-29: qty 10

## 2023-01-29 MED ORDER — ATORVASTATIN CALCIUM 10 MG PO TABS
20.0000 mg | ORAL_TABLET | Freq: Every day | ORAL | Status: DC
  Administered 2023-01-30 – 2023-02-01 (×3): 20 mg via ORAL
  Filled 2023-01-29 (×3): qty 2

## 2023-01-29 MED ORDER — LOSARTAN POTASSIUM 50 MG PO TABS
100.0000 mg | ORAL_TABLET | Freq: Every day | ORAL | Status: DC
  Administered 2023-01-30 – 2023-02-01 (×3): 100 mg via ORAL
  Filled 2023-01-29 (×3): qty 2

## 2023-01-29 MED ORDER — TRAZODONE HCL 50 MG PO TABS
50.0000 mg | ORAL_TABLET | Freq: Every day | ORAL | Status: DC
  Administered 2023-01-30 – 2023-01-31 (×2): 50 mg via ORAL
  Filled 2023-01-29 (×3): qty 1

## 2023-01-29 MED ORDER — IOHEXOL 350 MG/ML SOLN
100.0000 mL | Freq: Once | INTRAVENOUS | Status: AC | PRN
  Administered 2023-01-29: 100 mL via INTRAVENOUS

## 2023-01-29 MED ORDER — PROCHLORPERAZINE EDISYLATE 10 MG/2ML IJ SOLN
10.0000 mg | Freq: Once | INTRAMUSCULAR | Status: AC
  Administered 2023-01-29: 10 mg via INTRAVENOUS
  Filled 2023-01-29: qty 2

## 2023-01-29 MED ORDER — APIXABAN 5 MG PO TABS
5.0000 mg | ORAL_TABLET | Freq: Two times a day (BID) | ORAL | Status: DC
  Administered 2023-01-30 – 2023-02-01 (×5): 5 mg via ORAL
  Filled 2023-01-29 (×5): qty 1

## 2023-01-29 MED ORDER — CARVEDILOL 12.5 MG PO TABS
12.5000 mg | ORAL_TABLET | Freq: Two times a day (BID) | ORAL | Status: DC
  Administered 2023-01-30 – 2023-02-01 (×4): 12.5 mg via ORAL
  Filled 2023-01-29 (×5): qty 1

## 2023-01-29 MED ORDER — ONDANSETRON HCL 4 MG/2ML IJ SOLN
4.0000 mg | Freq: Once | INTRAMUSCULAR | Status: AC
  Administered 2023-01-29: 4 mg via INTRAVENOUS
  Filled 2023-01-29: qty 2

## 2023-01-29 MED ORDER — PANTOPRAZOLE SODIUM 40 MG PO TBEC
40.0000 mg | DELAYED_RELEASE_TABLET | Freq: Every day | ORAL | Status: DC
  Administered 2023-01-30 – 2023-02-01 (×3): 40 mg via ORAL
  Filled 2023-01-29 (×3): qty 1

## 2023-01-29 MED ORDER — FUROSEMIDE 10 MG/ML IJ SOLN
80.0000 mg | Freq: Once | INTRAMUSCULAR | Status: AC
  Administered 2023-01-29: 80 mg via INTRAVENOUS
  Filled 2023-01-29: qty 8

## 2023-01-29 MED ORDER — FENTANYL CITRATE PF 50 MCG/ML IJ SOSY
50.0000 ug | PREFILLED_SYRINGE | Freq: Once | INTRAMUSCULAR | Status: AC
  Administered 2023-01-29: 50 ug via INTRAVENOUS
  Filled 2023-01-29: qty 1

## 2023-01-29 MED ORDER — HEPARIN SODIUM (PORCINE) 5000 UNIT/ML IJ SOLN
5000.0000 [IU] | Freq: Three times a day (TID) | INTRAMUSCULAR | Status: DC
  Administered 2023-01-29: 5000 [IU] via SUBCUTANEOUS
  Filled 2023-01-29: qty 1

## 2023-01-29 MED ORDER — ONDANSETRON HCL 4 MG PO TABS: 4.0000 mg | ORAL_TABLET | Freq: Three times a day (TID) | ORAL | Status: DC | PRN

## 2023-01-29 MED ORDER — AMIODARONE HCL 200 MG PO TABS
200.0000 mg | ORAL_TABLET | Freq: Every day | ORAL | Status: DC
  Administered 2023-01-30 – 2023-02-01 (×3): 200 mg via ORAL
  Filled 2023-01-29 (×3): qty 1

## 2023-01-29 MED ORDER — ONDANSETRON HCL 4 MG/2ML IJ SOLN: 4.0000 mg | Freq: Three times a day (TID) | INTRAMUSCULAR | Status: DC | PRN

## 2023-01-29 NOTE — ED Triage Notes (Signed)
Pt coming from dialysis, lives at home by herself, for about 3 days she is complaining of a headache and left lower abdomen radiates in to left lower back.  Did not finish dialysis.  170/90 P78 97% room air  CBG 103

## 2023-01-29 NOTE — ED Notes (Signed)
Report given to [facility].

## 2023-01-29 NOTE — H&P (Incomplete)
Date: 01/30/2023         Patient Name:  Audrey Hill MRN: [id number]  DOB: Jul 08, 1956 Age / Sex: 67 y.o., female   PCP: Modena Slater, DO         Medical Service: Internal Medicine Teaching Service         Attending Physician: Dr. Oswaldo Done, Marquita Palms, *    First Contact: Dr. Clearance Coots Pager: [phone]  Second Contact: Dr. August Saucer Pager: [phone]       After Hours (After 5p/  First Contact Pager: [phone]  weekends / holidays): Second Contact Pager: [phone]   Chief Concern: Abdominal pain that keeps her up at night  History of Present Illness:  67 year old person with ESRD comes to the hospital after interrupting dialysis because of abdominal pain.  Pain started days ago and has been keeping her up at night.  It is a sharp pain that comes and goes.  It is worse on the lower left part of her abdomen.  Makes it difficult for her to breathe.  She reports associated diarrhea with 3-4 loose stools per day.  Few days ago she had a large bloody bowel movement but no more blood since then.  Her last bowel movement was Friday night.  In the emergency department a CT of the abdomen was obtained to rule out a dissection.  Fentanyl was administered for her pain.  She became hypoxic and required supplemental oxygen to maintain her saturation greater than 94%.  VBG showed mild hypercapnia for which BiPAP was started temporarily.  Review of Systems  Constitutional:  Negative for chills and fever.  Respiratory:  Negative for cough.   Cardiovascular:  Negative for chest pain, palpitations and orthopnea.  Gastrointestinal:  Negative for nausea and vomiting.   Allergies: Allergies  Allergen Reactions   Lisinopril Rash and Swelling   Metoclopramide     Other reaction(s): Other Patient has dystonia with administration of drug.  Other reaction(s): Other (See Comments) Patient has dystonia with administration of drug.  Patient has dystonia with administration of drug.     Nortriptyline      Other reaction(s): Other (See Comments) unknown "Mini stroke"    Tramadol Nausea And Vomiting        Acetaminophen Rash and Other (See Comments)    .    Nitroglycerin Hives and Rash   Ibuprofen Other (See Comments)    Upset gi   Norvasc [Amlodipine Besylate] Swelling   Nsaids Other (See Comments)    GI upset   Past Medical History: Recently hospitalized with hypoxic respiratory failure in the setting of missed dialysis.  Was discharged to skilled nursing facility for rehab.  Has been living in her daughter's house after being discharged from rehab over a week ago.  Patient Active Problem List   Diagnosis Date Noted   Respiratory failure (HCC) 01/30/2023   Chronic systolic heart failure (HCC) 01/30/2023   Ascites 01/30/2023   ESRD on dialysis [facility]) 08/10/2022   Chronic bilateral back pain 08/10/2022   MDD (major depressive disorder) 08/05/2022   Chronic pain syndrome 08/02/2022   Need for follow-up by social worker 08/02/2022   Acute congestive heart failure (HCC)    Acute hypoxemic respiratory failure (HCC) 08/01/2022   Dysphagia 12/31/2021   Abdominal pain, epigastric 12/31/2021   Pleuritic chest pain 11/20/2021   Insomnia 11/20/2021   Right hip pain 09/23/2021   Left-sided back pain 08/20/2021   ESRD (end stage renal disease) on dialysis (HCC) 08/18/2021   Anemia  of chronic disease 08/18/2021   Malnutrition of moderate degree 08/08/2021   Esophagitis    Asthma, chronic 11/13/2014   History of stroke 11/13/2014   Atrial fibrillation (HCC) 11/13/2014   Healthcare maintenance 06/18/2013   Hypertension    Hyperlipidemia    GERD (gastroesophageal reflux disease)     Medications: She reports good adherence to her medications but does not know what she takes from memory.  No one helps her with her medicines outside of the hospital.  She follows instructions on her bottles.  No current facility-administered medications on file prior to encounter.   Current Outpatient  Medications on File Prior to Encounter  Medication Sig Dispense Refill   albuterol (VENTOLIN HFA) 108 (90 Base) MCG/ACT inhaler Inhale 2 puffs into the lungs every 6 (six) hours as needed (Cough).     amiodarone (PACERONE) 200 MG tablet Take 200 mg by mouth daily.     apixaban (ELIQUIS) 5 MG TABS tablet Take 1 tablet (5 mg total) by mouth 2 (two) times daily. (Patient taking differently: Take 2.5 mg by mouth 2 (two) times daily.) 60 tablet 0   atorvastatin (LIPITOR) 20 MG tablet TAKE 1 TABLET BY MOUTH DAILY 90 tablet 3   calcium acetate (PHOSLO) 667 MG tablet Take 667 mg by mouth with breakfast, with lunch, and with evening meal. Take 1 tablet with a snack     carvedilol (COREG) 12.5 MG tablet Take 1 tablet (12.5 mg total) by mouth 2 (two) times daily with a meal. 60 tablet 0   Darbepoetin Alfa (ARANESP) 100 MCG/0.5ML SOSY injection Inject 0.5 mLs (100 mcg total) into the vein every Saturday with hemodialysis. 4.2 mL 1   dextromethorphan 15 MG/5ML syrup Take 10 mLs (30 mg total) by mouth 3 (three) times daily as needed for cough. 120 mL 0   furosemide (LASIX) 80 MG tablet Take 80 mg by mouth See admin instructions. Take 1 tablet by mouth on your non-dialysis days (Sunday, Monday, Wednesday, Friday)     gabapentin (NEURONTIN) 300 MG capsule Take 300 mg by mouth in the morning.     guaiFENesin (ROBITUSSIN) 100 MG/5ML liquid Take 5 mLs by mouth every 4 (four) hours as needed for cough or to loosen phlegm. 120 mL 0   hydrALAZINE (APRESOLINE) 25 MG tablet Take 3 tablets (75 mg total) by mouth every 8 (eight) hours. (Patient taking differently: Take 25 mg by mouth 2 (two) times daily.) 270 tablet 0   isosorbide dinitrate (ISORDIL) 20 MG tablet Take 20 mg by mouth 2 (two) times daily.     lidocaine (LIDODERM) 5 % Place 1 patch onto the skin daily as needed. Remove & Discard patch within 12 hours or as directed by MD 15 patch 0   losartan (COZAAR) 100 MG tablet TAKE 1 TABLET BY MOUTH EVERY DAY 90 tablet 0    melatonin 3 MG TABS tablet Take 3 mg by mouth at bedtime.     mirtazapine (REMERON) 7.5 MG tablet Take 1 tablet (7.5 mg total) by mouth at bedtime for 14 days. 14 tablet 0   multivitamin (RENA-VIT) TABS tablet Take 1 tablet by mouth at bedtime. 60 tablet 1   pantoprazole (PROTONIX) 40 MG tablet Take 1 tablet (40 mg total) by mouth 2 (two) times daily. (Patient taking differently: Take 40 mg by mouth daily.) 60 tablet 1   traZODone (DESYREL) 50 MG tablet Take 1 tablet (50 mg total) by mouth at bedtime. 30 tablet 0   triamcinolone ointment (KENALOG) 0.1 %  Apply 1 Application topically 2 (two) times daily.     [DISCONTINUED] promethazine (PHENERGAN) 12.5 MG suppository Place 1 suppository (12.5 mg total) rectally every 8 (eight) hours as needed. (Patient not taking: Reported on 11/13/2014) 12 suppository 0   Surgical History: Reviewed, no pertinent updates.  Family History: Reviewed, notable for very prevalent heart disease and many members of her family   Social History:  She was in SNF until last Thursday. Now staying with her daughter. Manages her own meds. Ambulates with a walker.   Physical Exam: Blood pressure (!) 174/89, pulse 70, temperature 98.2 F (36.8 C), temperature source Oral, resp. rate 16, height 5\' 6"  (1.676 m), weight 83 kg, SpO2 96 %.  Somnolent appearing Oral mucous membranes are pink and dry Heart rate is normal, rhythm is regular, no appreciable murmurs, notable heave, JVD elevated to the level of the ear, trace pitting edema bilateral lower extremities, brisk capillary refill, strong bilateral radial pulses, strong posterior tibialis right lower extremity, strong dorsalis pedis left lower extremity Breathing is regular and unlabored on nasal cannula, no wheezing, bibasilar crackles appreciated, left greater than right Abdomen is soft, distended, nontender, appreciable fluid wave, likely hepatomegaly Skin is warm and dry Alert and oriented, no focal neurologic deficits,  speech is fluent Concordant mood and affect  EKG:  Sinus rhythm with a left bundle branch block  Labs: VBG 7.406/61.4  Sodium 139 Potassium 4.7 BUN/creatinine 27/4.27 Alkaline phosphatase 136 Albumin 2.9 AST/ALT 47/3.2  WBC 5.2 Hemoglobin 9.59 platelets 212  Hep B surface antigen negative  Images and other studies: CT head without acute findings  CT chest abdomen pelvis with a markedly enlarged heart, a nodular opacity in the left upper lobe of the lung, large ascites, hypoattenuation in the spleen, stable since January 2024  CXR with hazy bilateral opacities and marked cardiomegaly  Assessment & Plan:  Audrey Hill is a 67 y.o. with systolic heart failure and ESRD on dialysis who presents with several days of abdominal pain, found to have ascites and other signs of volume overload, admitted for volume optimization.  Principal Problem:   Respiratory failure (HCC) Active Problems:   Atrial fibrillation (HCC)   ESRD on dialysis (HCC)   Chronic systolic heart failure (HCC)   Ascites  Volume overload Hypercapnic and hypoxic respiratory failure Person with JVD to the ears and large ascites on her CT scan.  She terminated dialysis early today, but I do not suspect that she accumulated this acutely today.  She reports good adherence to dialysis in the last week or so since being discharged from rehab, but notably her last hospitalization was for acute hypoxic respiratory failure in the setting of several missed dialysis sessions.  Wonder if medical nonadherence may be playing a role in her current syndrome.  Appreciate nephrology's assistance with dialysis and volume optimization. - Status post furosemide IV 80 mg x 1 - O2 to maintain saturation greater than 92%  Ascites Acute on chronic abdominal pain Suspect a relationship between these findings.  Current clinical syndrome is complex due to history of chronic abdominal pain.  She has pretty significant ascites on her  CT abdomen pelvis which has been accumulating since September 2023.  Although she reports some loose stool, frequency does not raise concern for infectious or inflammatory cause of diarrhea.  On exam she has what I suspect to be a palpable liver edge in her right upper quadrant.  This makes me wonder about portal hypertension, maybe secondary to chronic right heart  failure.  No worrisome signs for infection, don't suspect SBP.  No other signs of hepatic dysfunction on exam.  Liver enzymes are marginally elevated but not worryingly so.  Wonder if she would benefit from paracentesis for symptom relief while hospitalized. - Right upper quadrant ultrasound - A.m. PT/INR  Systolic heart failure Severe hypertension Well-perfused, no concern for low output state at this point.  Notable precordial heave on exam and markedly enlarged heart on imaging.  BP elevated to 190s systolic on presentation to ED.  Will restart home meds for heart failure and afterload reduction. - Carvedilol 12.5 mg twice daily - Losartan 100 mg - Isosorbide 20 mg twice daily  ESRD Dialysis dependent.  Terminated dialysis early today because of abdominal pain.  Nephrology consulted from ED for dialysis while hospitalized.  History of atrial fibrillation Sinus rhythm now.  Maintained on amiodarone.  Anticoagulated on Eliquis. - Amiodarone 200 mg - Eliquis 2.5 mg twice daily  Advanced directives Patient is adamant that she prefers aggressive measures including intubation and CPR if she suffers hospital death.  This is contrary to previously documented wishes on MOST form.  At this point she is alert, oriented, and expresses insight and judgment necessary to reverse her previous decision.  Diet: Renal with fluid restriction to 1200 mL IVF: None VTE: Therapeutic Eliquis Code: Full Surrogate: Daughter Maida Sale  Admit patient to Observation with expected length of stay less than 2 midnights.  Signed: Marrianne Mood  MD 01/30/2023, 12:44 AM  Pager: [phone] After 5pm on weekdays and 1pm on weekends: [phone]
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
## 2023-01-30 ENCOUNTER — Observation Stay (HOSPITAL_COMMUNITY): Payer: Medicare HMO

## 2023-01-30 DIAGNOSIS — R188 Other ascites: Secondary | ICD-10-CM

## 2023-01-30 DIAGNOSIS — I5023 Acute on chronic systolic (congestive) heart failure: Secondary | ICD-10-CM | POA: Diagnosis present

## 2023-01-30 DIAGNOSIS — J9690 Respiratory failure, unspecified, unspecified whether with hypoxia or hypercapnia: Secondary | ICD-10-CM

## 2023-01-30 DIAGNOSIS — I5020 Unspecified systolic (congestive) heart failure: Secondary | ICD-10-CM | POA: Diagnosis present

## 2023-01-30 LAB — CBC
HCT: 25.5 % — ABNORMAL LOW (ref 36.0–46.0)
Hemoglobin: 7.7 g/dL — ABNORMAL LOW (ref 12.0–15.0)
MCH: 26.2 pg (ref 26.0–34.0)
MCHC: 30.2 g/dL (ref 30.0–36.0)
MCV: 86.7 fL (ref 80.0–100.0)
Platelets: 192 10*3/uL (ref 150–400)
RBC: 2.94 MIL/uL — ABNORMAL LOW (ref 3.87–5.11)
RDW: 17.9 % — ABNORMAL HIGH (ref 11.5–15.5)
WBC: 4 10*3/uL (ref 4.0–10.5)
nRBC: 0 % (ref 0.0–0.2)

## 2023-01-30 LAB — COMPREHENSIVE METABOLIC PANEL
ALT: 25 U/L (ref 0–44)
AST: 27 U/L (ref 15–41)
Albumin: 2.5 g/dL — ABNORMAL LOW (ref 3.5–5.0)
Alkaline Phosphatase: 116 U/L (ref 38–126)
Anion gap: 9 (ref 5–15)
BUN: 30 mg/dL — ABNORMAL HIGH (ref 8–23)
CO2: 31 mmol/L (ref 22–32)
Calcium: 8.7 mg/dL — ABNORMAL LOW (ref 8.9–10.3)
Chloride: 99 mmol/L (ref 98–111)
Creatinine, Ser: 4.91 mg/dL — ABNORMAL HIGH (ref 0.44–1.00)
GFR, Estimated: 9 mL/min — ABNORMAL LOW (ref >60)
Glucose, Bld: 73 mg/dL (ref 70–99)
Potassium: 3.7 mmol/L (ref 3.5–5.1)
Sodium: 139 mmol/L (ref 135–145)
Total Bilirubin: 0.8 mg/dL (ref 0.3–1.2)
Total Protein: 5.8 g/dL — ABNORMAL LOW (ref 6.5–8.1)

## 2023-01-30 LAB — TROPONIN I (HIGH SENSITIVITY)
Troponin I (High Sensitivity): 85 ng/L — ABNORMAL HIGH (ref <18)
Troponin I (High Sensitivity): 74 ng/L — ABNORMAL HIGH (ref <18)

## 2023-01-30 LAB — PROTIME-INR
INR: 1.6 — ABNORMAL HIGH (ref 0.8–1.2)
Prothrombin Time: 19 seconds — ABNORMAL HIGH (ref 11.4–15.2)

## 2023-01-30 MED ORDER — HYDROMORPHONE HCL 2 MG PO TABS
1.0000 mg | ORAL_TABLET | Freq: Four times a day (QID) | ORAL | Status: DC | PRN
  Administered 2023-01-30 – 2023-02-01 (×6): 1 mg via ORAL
  Filled 2023-01-30 (×7): qty 1

## 2023-01-30 MED ORDER — SODIUM CHLORIDE 0.9 % IV SOLN: 100.0000 mg | INTRAVENOUS | Status: DC

## 2023-01-30 MED ORDER — CALCIUM ACETATE (PHOS BINDER) 667 MG PO CAPS
1334.0000 mg | ORAL_CAPSULE | Freq: Three times a day (TID) | ORAL | Status: DC
  Administered 2023-01-30 – 2023-02-01 (×5): 1334 mg via ORAL
  Filled 2023-01-30 (×5): qty 2

## 2023-01-30 MED ORDER — ACETAMINOPHEN 325 MG PO TABS: 650.0000 mg | ORAL_TABLET | Freq: Four times a day (QID) | ORAL | Status: DC | PRN

## 2023-01-30 MED ORDER — FUROSEMIDE 40 MG PO TABS
80.0000 mg | ORAL_TABLET | ORAL | Status: DC
  Administered 2023-01-31: 80 mg via ORAL
  Filled 2023-01-30: qty 2

## 2023-01-30 MED ORDER — HYDRALAZINE HCL 50 MG PO TABS
75.0000 mg | ORAL_TABLET | Freq: Three times a day (TID) | ORAL | Status: DC
  Administered 2023-01-30 – 2023-02-01 (×5): 75 mg via ORAL
  Filled 2023-01-30 (×6): qty 1

## 2023-01-30 MED ORDER — DOXERCALCIFEROL 4 MCG/2ML IV SOLN
6.0000 ug | INTRAVENOUS | Status: DC
  Administered 2023-02-01: 6 ug via INTRAVENOUS
  Filled 2023-01-30 (×2): qty 4

## 2023-01-30 MED ORDER — SODIUM CHLORIDE 0.9 % IV SOLN
100.0000 mg | INTRAVENOUS | Status: DC
  Administered 2023-02-01: 100 mg via INTRAVENOUS
  Filled 2023-01-30: qty 5
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
## 2023-01-30 NOTE — Hospital Course (Addendum)
Principal Problem:   Flash pulmonary edema (HCC)  Resolved Problems:   * No resolved hospital problems. *  Consults:***  Procedures:***  Follow-up items: - lung nodule  4/28: headache still bad, stomach still hurts badly. All across, under L breast and around back. Says pain is constant. Doesn't seem to wince at abdominal palpation. Says HD didn't help at all. Hasn't had anything to eat or drink in ***? Says she's allergic to tylenol but takes percocet or has taken it? Says it has been hard at home. Daughter helps take care of her. Gets transportation to HD. Hasn't missed sessions. Only came off early on one day.on 1.5L  4/29: sharp shooting pain on her leg. Breathing is a little better. Has been up and moving up a little, going to [facility]. Uses cane to get around at home. Says swelling is new but gets better after HD. After she's on it it gets more swollen. -seems okay with going home with pain control once she's able

## 2023-01-30 NOTE — Progress Notes (Signed)
Subjective:  Audrey Hill was feeling bad this morning, reporting significant amount of abdominal pain. Describes little to zero improvement since arrival and hemodialysis. Provided reassurance and discussed plan to prescribe hydromorphone in the short-term for relief.    Objective: Vitals over previous 24hr: Vitals:   01/30/23 0651 01/30/23 0721 01/30/23 0748 01/30/23 0851  BP: (!) 187/99 (!) 190/112  (!) 184/91  Pulse: 75 74  74  Resp: (!) 23 (!) 21  18  Temp:  98.4 F (36.9 C)  97.7 F (36.5 C)  TempSrc:  Oral  Oral  SpO2: 99% 97%  93%  Weight:   74.2 kg   Height:        General:      awake and alert, sitting uncomfortably in bed, cooperative, in mild acute distress Lungs:      normal respiratory effort, breathing unlabored, symmetrical chest rise Cardiac:      regular rate and rhythm, soft S1 and S2 Abdomen:      soft and non-distended, normoactive bowel sounds, significant tenderness to palpation, no guarding or rigidity Neurologic:      oriented to person-place-time, moving all extremities, no gross focal deficits Psychiatric:      distressed mood with congruent affect, intelligible speech    Assessment/Plan: Audrey Hill is a 67 year old female with a past medical history of hypertension, polysubstance use, chronic pain, AFib, HFrEF, and ESRD who presented with headache and abdominal pain, now admitted for management of hypervolemia including hemodialysis.   ---Acute on chronic heart failure reduced ejection fraction Patient has history of heart failure reduced ejection fraction. Most recent echocardiogram performed 07-2021 demonstrated EF 30-35, LV global hypokinesis, and grade II diastolic dysfunction. Managed at home with carvedilol, isosorbide, losartan, hydralazine, and furosemide. Presented on 4-28 with abdominal pain that caused her to prematurely end a hemodialysis session. Upon arrival, patient appeared volume overloaded. Intravenous furosemide was  administered in the ED and patient underwent urgent hemodialysis session for fluid removal. Weight on admission was 83kg, currently 74kg.  > Carvedilol 12.5mg  q12  > Isosorbide 20mg  q12  > Losartan 100mg  q24  > Monitor daily weights and ins-outs   ---End-stage renal disease Patient has history of end-stage renal disease managed with hemodialysis per [facility] schedule. On 4-27, her hemodialysis session ended prematurely due to severe abdominal pain. Upon arrival, patient was volume overloaded. Urgent hemodialysis performed shortly after admission. Plan to resume sessions per usual schedule.  > Nephrology consult, appreciate recommendations   > Hemodialysis per [facility] schedule  > Monitor daily weights and ins-outs   ---Chronic pain syndrome Patient has history of chronic abdominal pain without clear etiology, managed at home with gabapentin and lidocaine. Presented on 4-27 with significant abdominal pain. Diagnostic workup including abdominal CT notable only for moderate ascites, which cannot fully explain her reported symptoms. Plan to start hydromorphone for short-term relief and consider palliative consult. Given multiple morbidities and overall poor prognosis, patient would likely benefit from goals of care discussion.  > Acetaminophen 650mg  q6 PRN  > Hydromorphone 1mg  q6 PRN  > Trazodone 50mg  q24   ---Hypertension  Patient has history of hypertension managed with carvedilol, isosorbide, losartan, hydralazine, and furosemide. She also receives hemodialysis at least three times per week. Blood pressure has remained elevated since arrival. Carvedilol, isosorbide, and losartan continued on admission for better control. Optimizing volume status with hemodialysis will also lower blood pressure.  > Carvedilol 12.5mg  q12  > Isosorbide 20mg  q12  > Losartan 100mg  q24   ---Atrial fibrillation Patient  has a history of atrial fibrillation managed at home with apixaban and amiodarone. Upon  arrival, electrocardiogram demonstrated sinus rhythm and left atrial enlargement. Home amiodarone and apixaban continued on admission.  > Amiodarone 200mg  q24  > Apixaban 5mg  q12   ---Abdominal ascites  Upon arrival, abdominal CT demonstrated large amount of ascites in abdomen and pelvis. Etiology almost certainly volume overload secondary to HFrEF and ESRD. Abdominal pain is likely unrelated to abdominal ascites.  > Monitor clinically    Principal Problem:   Acute on chronic HFrEF (heart failure with reduced ejection fraction) (HCC) Active Problems:   Hypertension   Atrial fibrillation (HCC)   Malnutrition of moderate degree   Abdominal pain, epigastric   Chronic pain syndrome   MDD (major depressive disorder)   ESRD on dialysis [facility])    Prior to Admission Living Arrangement: home with daughter Anticipated Discharge Location: home Barriers to Discharge: symptomatic improvement Dispo: Anticipated discharge in approximately 1-2 day(s).    Lajuana Ripple, MD Internal Medicine PGY-1 Pager [phone]  After 5pm on weekdays and 1pm on weekends: On Call pager [phone]

## 2023-01-31 DIAGNOSIS — I110 Hypertensive heart disease with heart failure: Secondary | ICD-10-CM

## 2023-01-31 DIAGNOSIS — Z87891 Personal history of nicotine dependence: Secondary | ICD-10-CM | POA: Diagnosis not present

## 2023-01-31 DIAGNOSIS — I5023 Acute on chronic systolic (congestive) heart failure: Secondary | ICD-10-CM | POA: Diagnosis not present

## 2023-01-31 LAB — BASIC METABOLIC PANEL
Anion gap: 8 (ref 5–15)
BUN: 24 mg/dL — ABNORMAL HIGH (ref 8–23)
CO2: 31 mmol/L (ref 22–32)
Calcium: 8.6 mg/dL — ABNORMAL LOW (ref 8.9–10.3)
Chloride: 95 mmol/L — ABNORMAL LOW (ref 98–111)
Creatinine, Ser: 4.32 mg/dL — ABNORMAL HIGH (ref 0.44–1.00)
GFR, Estimated: 11 mL/min — ABNORMAL LOW (ref >60)
Glucose, Bld: 85 mg/dL (ref 70–99)
Potassium: 3.5 mmol/L (ref 3.5–5.1)
Sodium: 134 mmol/L — ABNORMAL LOW (ref 135–145)

## 2023-01-31 LAB — CBC
HCT: 26 % — ABNORMAL LOW (ref 36.0–46.0)
Hemoglobin: 8 g/dL — ABNORMAL LOW (ref 12.0–15.0)
MCH: 26.8 pg (ref 26.0–34.0)
MCHC: 30.8 g/dL (ref 30.0–36.0)
MCV: 87.2 fL (ref 80.0–100.0)
Platelets: 230 10*3/uL (ref 150–400)
RBC: 2.98 MIL/uL — ABNORMAL LOW (ref 3.87–5.11)
RDW: 18 % — ABNORMAL HIGH (ref 11.5–15.5)
WBC: 4.7 10*3/uL (ref 4.0–10.5)
nRBC: 0 % (ref 0.0–0.2)

## 2023-01-31 MED ORDER — CHLORHEXIDINE GLUCONATE CLOTH 2 % EX PADS: 6.0000 | MEDICATED_PAD | Freq: Every day | CUTANEOUS | Status: DC
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
## 2023-01-31 NOTE — Progress Notes (Signed)
Occupational Therapy Treatment Patient Details Name: Audrey Hill MRN: [id number] DOB: August 23, 1956 Today's Date: 01/31/2023   History of present illness 67 yo female adm 4/27 with abdominal pain, CHF and volume overload after shortened HD. 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  PTA she has been staying with her daughter, and needing assist with community mobility and heavy iADL.  Patient continues to complete her own ADL from a sit to stand level, and was using a SPC for mobility.  Currently she is supervision at [facility] level for in room mobility/toileting, and needing supervision for lower body ADL.  OT can follow in the acute setting to address deficits, and home based OT can be considered.      Recommendations for follow up therapy are one component of a multi-disciplinary discharge planning process, led by the attending physician.  Recommendations may be updated based on patient status, additional functional criteria and insurance authorization.    Assistance Recommended at Discharge Intermittent Supervision/Assistance  Patient can return home with the following  Help with stairs or ramp for entrance;Assist for transportation;Assistance with cooking/housework   Equipment Recommendations  None recommended by OT    Recommendations for Other Services      Precautions / Restrictions Precautions Precautions: Fall Restrictions Weight Bearing Restrictions: No       Mobility Bed Mobility Overal bed mobility: Modified Independent                  Transfers Overall transfer level: Needs assistance Equipment used: Rolling walker (2 wheels) Transfers: Sit to/from Stand, Bed to chair/wheelchair/BSC Sit to Stand: Supervision     Step pivot transfers: Supervision, Min guard           Balance Overall balance assessment: Needs assistance Sitting-balance  support: Feet supported Sitting balance-Leahy Scale: Good     Standing balance support: Reliant on assistive device for balance Standing balance-Leahy Scale: Fair Standing balance comment: able to static stand at the sink                           ADL either performed or assessed with clinical judgement   ADL       Grooming: Wash/dry hands;Wash/dry face;Oral care;Supervision/safety;Standing               Lower Body Dressing: Min guard;Supervision/safety   Toilet Transfer: Min guard;Rolling walker (2 wheels);Regular Teacher, adult education Details (indicate cue type and reason): Min Guard with SPC, and closer to supervision with RW                Extremity/Trunk Assessment Upper Extremity Assessment Upper Extremity Assessment: Generalized weakness   Lower Extremity Assessment Lower Extremity Assessment: Defer to PT evaluation   Cervical / Trunk Assessment Cervical / Trunk Assessment: Kyphotic    Vision Patient Visual Report: No change from baseline     Perception Perception Perception: Within Functional Limits   Praxis Praxis Praxis: Intact    Cognition Arousal/Alertness: Awake/alert Behavior During Therapy: WFL for tasks assessed/performed Overall Cognitive Status: Within Functional Limits for tasks assessed  General Comments      Pertinent Vitals/ Pain       Pain Assessment Pain Assessment: Faces Faces Pain Scale: Hurts a little bit Pain Location: generalized stiffness Pain Descriptors / Indicators: Aching Pain Intervention(s): Monitored during session                                                          Frequency  Min 2X/week        Progress Toward Goals  OT Goals(current goals can now be found in the care plan section)  Progress towards OT goals: Progressing toward goals  ADL Goals Pt Will Perform Grooming: with  modified independence;standing Pt Will Perform Lower Body Dressing: with modified independence;sit to/from stand Pt Will Transfer to Toilet: with modified independence;ambulating;regular height toilet Pt/caregiver will Perform Home Exercise Program: Increased strength;Both right and left upper extremity;With theraband;With Supervision  Plan      Co-evaluation                 AM-PAC OT "6 Clicks" Daily Activity     Outcome Measure   Help from another person eating meals?: None Help from another person taking care of personal grooming?: A Little Help from another person toileting, which includes using toliet, bedpan, or urinal?: A Little Help from another person bathing (including washing, rinsing, drying)?: A Little Help from another person to put on and taking off regular upper body clothing?: None Help from another person to put on and taking off regular lower body clothing?: A Little 6 Click Score: 20    End of Session Equipment Utilized During Treatment: Rolling walker (2 wheels)  OT Visit Diagnosis: Unsteadiness on feet (R26.81);Muscle weakness (generalized) (M62.81)   Activity Tolerance Patient tolerated treatment well   Patient Left in bed;with call bell/phone within reach   Nurse Communication Mobility status        Time: 1610-9604 OT Time Calculation (min): 21 min  Charges: OT General Charges $OT Visit: 1 Visit OT Evaluation $OT Eval Moderate Complexity: 1 Mod  01/31/2023  RP, OTR/L  Acute Rehabilitation Services  Office:  [phone]   Suzanna Obey 01/31/2023, 10:44 AM

## 2023-01-31 NOTE — Progress Notes (Signed)
Subjective:  Patient reports doing okay today. Says she is having persistent R hip pain and L flank/back pain. Has been getting up and walking to the bathroom, denies SOB at rest or with ambulation. Says dilaudid is helping with her pain, asking for more of this.   Objective: Vitals over previous 24hr: Vitals:   01/30/23 0851 01/30/23 1541 01/30/23 2129 01/31/23 0531  BP: (!) 184/91 (!) 98/55 (!) 153/70 (!) 125/40  Pulse: 74 (!) 57 (!) 59 (!) 59  Resp: 18 17 18 20   Temp: 97.7 F (36.5 C) 98 F (36.7 C) 98 F (36.7 C) 98.4 F (36.9 C)  TempSrc: Oral Oral Oral Oral  SpO2: 93% 99% 91% 100%  Weight:    77.1 kg  Height:        General:                       awake and alert, sitting at bedside in no acute distress Lungs:                          Normal work of breathing on RA, lungs clear to auscultation bilaterally Cardiac:                        regular rate and rhythm, soft S1 and S2 Neurologic:                   oriented to person-place-time, moving all extremities, no gross focal deficits Extremities:                   Warm, dry, no lower extremity edema    Assessment/Plan: Audrey Hill is a 67 year old female with a past medical history of hypertension, polysubstance use, chronic pain, AFib, HFrEF, and ESRD who presented with headache and abdominal pain, now admitted for management of hypervolemia including hemodialysis.   ---Acute on chronic heart failure reduced ejection fraction Patient has history of heart failure reduced ejection fraction. Most recent echocardiogram performed 07-2021 demonstrated EF 30-35, LV global hypokinesis, and grade II diastolic dysfunction. Managed at home with carvedilol, isosorbide, losartan, hydralazine, and furosemide. Does not appear overtly volume overloaded on exam, per nephrology going to repeat HD today for woody edema causing pain.  > Carvedilol 12.5mg  q12  > Isosorbide 20mg  q12  > Losartan 100mg  q24  > Monitor daily weights and  ins-outs   ---End-stage renal disease Patient has history of end-stage renal disease managed with hemodialysis per [facility] schedule. On 4-27, her hemodialysis session ended prematurely due to severe abdominal pain. Upon arrival, patient was volume overloaded. Urgent hemodialysis performed shortly after admission. Will continue HD today per nephro then back to normal outpatient schedule at discharge.  > Nephrology consult, appreciate recommendations   > Hemodialysis per [facility] schedule  > Monitor daily weights and ins-outs   ---Chronic pain syndrome Patient has history of chronic abdominal pain without clear etiology, managed at home with gabapentin and lidocaine. Presented on 4-27 with significant abdominal pain. Diagnostic workup including abdominal CT notable only for moderate ascites, which cannot fully explain her reported symptoms. Will continue dilaudid. Will need palliative referral at discharge.  > Acetaminophen 650mg  q6 PRN  > Hydromorphone 1mg  q6 PRN  > Trazodone 50mg  q24   ---Hypertension  Patient has history of hypertension managed with carvedilol, isosorbide, losartan, hydralazine, and furosemide. She also receives hemodialysis at least three times per week. Blood pressure has remained  elevated since arrival. Carvedilol, isosorbide, and losartan continued on admission for better control. Optimizing volume status with hemodialysis will also lower blood pressure.  > Carvedilol 12.5mg  q12  > Isosorbide 20mg  q12  > Losartan 100mg  q24   ---Atrial fibrillation Patient has a history of atrial fibrillation managed at home with apixaban and amiodarone. Upon arrival, electrocardiogram demonstrated sinus rhythm and left atrial enlargement. Home amiodarone and apixaban continued on admission.  > Amiodarone 200mg  q24  > Apixaban 5mg  q12   ---Abdominal ascites  Upon arrival, abdominal CT demonstrated large amount of ascites in abdomen and pelvis. Etiology almost certainly volume  overload secondary to HFrEF and ESRD. Abdominal pain is likely unrelated to abdominal ascites.  > Monitor clinically    Principal Problem:   Acute on chronic HFrEF (heart failure with reduced ejection fraction) (HCC) Active Problems:   Hypertension   Atrial fibrillation (HCC)   Malnutrition of moderate degree   Abdominal pain, epigastric   Chronic pain syndrome   MDD (major depressive disorder)   ESRD on dialysis [facility])    Prior to Admission Living Arrangement: home with daughter Anticipated Discharge Location: home Barriers to Discharge: symptomatic improvement Dispo: Anticipated discharge in approximately 1-2 day(s).   Audrey Cluster, MD Internal Medicine Resident, PGY-1 [facility]  Pager: [phone]

## 2023-01-31 NOTE — Evaluation (Signed)
Physical Therapy Evaluation Patient Details Name: Audrey Hill MRN: [id number] DOB: Jan 04, 1956 Today's Date: 01/31/2023  History of Present Illness  67 yo female adm 4/27 with abdominal pain, CHF and volume overload after shortened HD. 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 Pt able to walk with walker and perform stairs without assist as well as perform pericare after toileting. Return home with HHPT is appropriate with pt aware of recommendation. Pt with decreased gait and activity tolerance who will benefit from acute therapy to maximize independence and safety.        Recommendations for follow up therapy are one component of a multi-disciplinary discharge planning process, led by the attending physician.  Recommendations may be updated based on patient status, additional functional criteria and insurance authorization.  Follow Up Recommendations       Assistance Recommended at Discharge Intermittent Supervision/Assistance  Patient can return home with the following  Assistance with cooking/housework;Assist for transportation;Direct supervision/assist for medications management;Direct supervision/assist for financial management    Equipment Recommendations None recommended by PT  Recommendations for Other Services       Functional Status Assessment Patient has had a recent decline in their functional status and/or demonstrates limited ability to make significant improvements in function in a reasonable and predictable amount of time     Precautions / Restrictions Precautions Precautions: Fall Restrictions Weight Bearing Restrictions: No      Mobility  Bed Mobility               General bed mobility comments: EOB on arrival    Transfers Overall transfer level: Needs assistance   Transfers: Sit to/from Stand Sit to  Stand: Supervision           General transfer comment: cues for hand placement to rise from bed, up from bed, toilet and to chair    Ambulation/Gait Ambulation/Gait assistance: Supervision Gait Distance (Feet): 160 Feet Assistive device: Rolling walker (2 wheels) Gait Pattern/deviations: Step-through pattern, Decreased stride length, Trunk flexed   Gait velocity interpretation: 1.31 - 2.62 ft/sec, indicative of limited community ambulator   General Gait Details: cues for posture and proximity to [employer] Stairs: Yes Stairs assistance: Modified independent (Device/Increase time) Stair Management: Step to pattern, Forwards, One rail Left Number of Stairs: 2    Wheelchair Mobility    Modified Rankin (Stroke Patients Only)       Balance Overall balance assessment: Needs assistance Sitting-balance support: No upper extremity supported, Feet supported Sitting balance-Leahy Scale: Fair     Standing balance support: Reliant on assistive device for balance, Bilateral upper extremity supported, During functional activity Standing balance-Leahy Scale: Fair Standing balance comment: RW in standing, washing hands at sink without support                             Pertinent Vitals/Pain Pain Assessment Pain Score: 3  Pain Location: back Pain Descriptors / Indicators: Sore Pain Intervention(s): Limited activity within patient's tolerance, Monitored during session, Repositioned    Home Living Family/patient expects to be discharged to:: Private residence Living Arrangements: Children Available Help at Discharge: Family;Available PRN/intermittently Type of Home: Apartment Home Access: Stairs to enter   Entrance Stairs-Number of Steps: 2   Home Layout: One level Home Equipment: Agricultural consultant (2 wheels);Cane - single point;Art gallery manager  Prior Function Prior Level of Function : Independent/Modified Independent             Mobility Comments: pt  reports walking with cane or RW       Hand Dominance        Extremity/Trunk Assessment   Upper Extremity Assessment Upper Extremity Assessment: Generalized weakness    Lower Extremity Assessment Lower Extremity Assessment: Generalized weakness    Cervical / Trunk Assessment Cervical / Trunk Assessment: Kyphotic  Communication   Communication: No difficulties  Cognition                                                General Comments      Exercises     Assessment/Plan    PT Assessment Patient needs continued PT services  PT Problem List Decreased activity tolerance;Decreased balance;Decreased knowledge of use of DME       PT Treatment Interventions DME instruction;Gait training;Functional mobility training;Therapeutic activities;Patient/family education;Therapeutic exercise;Balance training    PT Goals (Current goals can be found in the Care Plan section)  Acute Rehab PT Goals Patient Stated Goal: return home PT Goal Formulation: With patient Time For Goal Achievement: 02/14/23 Potential to Achieve Goals: Fair    Frequency Min 2X/week     Co-evaluation               AM-PAC PT "6 Clicks" Mobility  Outcome Measure Help needed turning from your back to your side while in a flat bed without using bedrails?: None Help needed moving from lying on your back to sitting on the side of a flat bed without using bedrails?: None Help needed moving to and from a bed to a chair (including a wheelchair)?: A Little Help needed standing up from a chair using your arms (e.g., wheelchair or bedside chair)?: A Little Help needed to walk in hospital room?: A Little Help needed climbing 3-5 steps with a railing? : None 6 Click Score: 21    End of Session   Activity Tolerance: Patient tolerated treatment well Patient left: in chair;with call bell/phone within reach;with chair alarm set Nurse Communication: Mobility status PT Visit Diagnosis: Other  abnormalities of gait and mobility (R26.89)    Time: 4098-1191 PT Time Calculation (min) (ACUTE ONLY): 19 min   Charges:   PT Evaluation $PT Eval Low Complexity: 1 Low          [name] P, PT Acute Rehabilitation Services Office: [phone]   Cristine Polio 01/31/2023, 10:57 AM
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
## 2023-01-31 NOTE — TOC Initial Note (Addendum)
Transition of Care (TOC) - Initial/Assessment Note   Spoke to patient at bedside. Patient currently staying with her daughter in [place].   Uses Medicaid transportation to get to appointments. Has number . Patient has walker and cane .  Received a call from Brandi with [facility] RN,PT,OT ,SW and aide   Confirmed with patient she wants to continue services with [facility] will need resumption of care orders and face to face. Secure chatted Dr Willette Cluster   Patient asking for motorized scooter . Explained she will need to go through PCP. DME agencies require PCP for on going paperwork. Patient voiced understanding  Patient Details  Name: Audrey Hill MRN: [id number] Date of Birth: 1956/04/30  Transition of Care [facility]) CM/SW Contact:    Kingsley Plan, RN Phone Number: 01/31/2023, 1:52 PM  Clinical Narrative:                   Expected Discharge Plan: Home w Home Health Services Barriers to Discharge: Continued Medical Work up   Patient Goals and CMS Choice Patient states their goals for this hospitalization and ongoing recovery are:: to return to home CMS Medicare.gov Compare Post Acute Care list provided to:: Patient Choice offered to / list presented to : Patient [place] ownership interest in [facility].provided to:: Patient    Expected Discharge Plan and Services   Discharge Planning Services: CM Consult Post Acute Care Choice: Home Health Living arrangements for the past 2 months: Single Family Home                 DME Arranged: N/A DME Agency: NA       HH Arranged: RN, PT, OT, Nurse's Aide, Social Work [employer] Agency: [facility] Date HH Agency Contacted: 01/31/23 Time HH Agency Contacted: 1351 Representative spoke with at [facility] Agency: Brandi  Prior Living Arrangements/Services Living arrangements for the past 2 months: Single Family Home Lives with:: Adult Children Patient language and need for interpreter  reviewed:: Yes Do you feel safe going back to the place where you live?: Yes      Need for Family Participation in Patient Care: Yes (Comment) Care giver support system in place?: Yes (comment) Current home services: DME Criminal Activity/Legal Involvement Pertinent to Current Situation/Hospitalization: No - Comment as needed  Activities of Daily Living Home Assistive Devices/Equipment: Cane (specify quad or straight) ADL Screening (condition at time of admission) Patient's cognitive ability adequate to safely complete daily activities?: Yes Is the patient deaf or have difficulty hearing?: Yes Does the patient have difficulty seeing, even when wearing glasses/contacts?: Yes Does the patient have difficulty concentrating, remembering, or making decisions?: Yes Patient able to express need for assistance with ADLs?: Yes Does the patient have difficulty dressing or bathing?: Yes Independently performs ADLs?: Yes (appropriate for developmental age) Does the patient have difficulty walking or climbing stairs?: Yes Weakness of Legs: Both Weakness of Arms/Hands: None  Permission Sought/Granted   Permission granted to share information with : No              Emotional Assessment Appearance:: Appears stated age Attitude/Demeanor/Rapport: Engaged Affect (typically observed): Accepting Orientation: : Oriented to Self, Oriented to Place, Oriented to  Time, Oriented to Situation Alcohol / Substance Use: Not Applicable Psych Involvement: No (comment)  Admission diagnosis:  Somnolence [R40.0] Flash pulmonary edema (HCC) [J81.0] Hypoxia [R09.02] Nonintractable headache, unspecified chronicity pattern, unspecified headache type [R51.9] Patient Active Problem List   Diagnosis Date Noted   Acute  on chronic HFrEF (heart failure with reduced ejection fraction) (HCC) 01/30/2023   ESRD on dialysis (HCC) 08/10/2022   Chronic bilateral back pain 08/10/2022   MDD (major depressive disorder)  08/05/2022   Chronic pain syndrome 08/02/2022   Dysphagia 12/31/2021   Abdominal pain, epigastric 12/31/2021   Insomnia 11/20/2021   Anemia of chronic disease 08/18/2021   Malnutrition of moderate degree 08/08/2021   Esophagitis    Asthma, chronic 11/13/2014   History of stroke 11/13/2014   Atrial fibrillation (HCC) 11/13/2014   Healthcare maintenance 06/18/2013   Hypertension    Hyperlipidemia    GERD (gastroesophageal reflux disease)    PCP:  Modena Slater, DO Pharmacy:   [id number] - [place], [place] - [address] AT [facility] OF [address] [place] [address] Phone: [phone] Fax: [phone]  [facility] [address] [place], [place] - [address] [place] [address] Phone: [phone] Fax: [phone]  [facility] [address] [place] [address] Phone: [phone] Fax: [phone]  [facility] [place] - [place], [place] - [address], [address] [place] [address] Phone: [phone] Fax: [phone]     Social Determinants of Health (SDOH) Social History: SDOH Screenings   Depression (PHQ2-9): Medium Risk (11/20/2021)  Tobacco Use: Medium Risk (12/02/2022)   SDOH Interventions:     Readmission Risk Interventions    08/02/2022    3:40 PM  Readmission Risk Prevention Plan  Transportation Screening Complete  PCP or Specialist Appt within 5-7 Days Complete  Home Care Screening Complete  Medication Review (RN CM) Complete

## 2023-01-31 NOTE — Discharge Instructions (Addendum)
You were hospitalized for trouble breathing after your dialysis session was cut short in the outpatient setting. The nephrology team followed you during admission and treated you with an extra dialysis session in addition to your scheduled dialysis. Your breathing improved with this. You should continue to go to dialysis at your regular Tuesday-Thursday-Saturday schedule. You also had pain while in the hospital. We started you on Dilaudid for pain control and discharged you with this. We can change this in the clinic as needed. You have a follow up outpatient appointment in the clinic 02/04/2023 10:15 AM . We have also referred you for palliative care to continue discussing your pain and your goals of care with dialysis. It was a pleasure caring for you.

## 2023-02-01 ENCOUNTER — Other Ambulatory Visit: Payer: Self-pay

## 2023-02-01 ENCOUNTER — Other Ambulatory Visit (HOSPITAL_COMMUNITY): Payer: Self-pay

## 2023-02-01 ENCOUNTER — Telehealth: Payer: Self-pay | Admitting: *Deleted

## 2023-02-01 DIAGNOSIS — I110 Hypertensive heart disease with heart failure: Secondary | ICD-10-CM | POA: Diagnosis not present

## 2023-02-01 DIAGNOSIS — Z87891 Personal history of nicotine dependence: Secondary | ICD-10-CM | POA: Diagnosis not present

## 2023-02-01 DIAGNOSIS — I5023 Acute on chronic systolic (congestive) heart failure: Secondary | ICD-10-CM | POA: Diagnosis not present

## 2023-02-01 LAB — RENAL FUNCTION PANEL
Albumin: 2.5 g/dL — ABNORMAL LOW (ref 3.5–5.0)
Anion gap: 12 (ref 5–15)
BUN: 34 mg/dL — ABNORMAL HIGH (ref 8–23)
CO2: 29 mmol/L (ref 22–32)
Calcium: 9.1 mg/dL (ref 8.9–10.3)
Chloride: 93 mmol/L — ABNORMAL LOW (ref 98–111)
Creatinine, Ser: 5.65 mg/dL — ABNORMAL HIGH (ref 0.44–1.00)
GFR, Estimated: 8 mL/min — ABNORMAL LOW (ref >60)
Glucose, Bld: 92 mg/dL (ref 70–99)
Phosphorus: 4.8 mg/dL — ABNORMAL HIGH (ref 2.5–4.6)
Potassium: 3.3 mmol/L — ABNORMAL LOW (ref 3.5–5.1)
Sodium: 134 mmol/L — ABNORMAL LOW (ref 135–145)

## 2023-02-01 MED ORDER — SODIUM CHLORIDE 0.9 % IV SOLN: 100.0000 mg | INTRAVENOUS | Status: DC

## 2023-02-01 MED ORDER — POTASSIUM CHLORIDE CRYS ER 20 MEQ PO TBCR
40.0000 meq | EXTENDED_RELEASE_TABLET | Freq: Once | ORAL | Status: AC
  Administered 2023-02-01: 40 meq via ORAL
  Filled 2023-02-01 (×2): qty 2

## 2023-02-01 MED ORDER — HYDROMORPHONE HCL 2 MG PO TABS
1.0000 mg | ORAL_TABLET | Freq: Three times a day (TID) | ORAL | 0 refills | Status: DC | PRN
  Filled 2023-02-01: qty 12, 8d supply, fill #0

## 2023-02-01 MED ORDER — ISOSORBIDE DINITRATE 20 MG PO TABS: 20.0000 mg | ORAL_TABLET | Freq: Two times a day (BID) | ORAL | 0 refills | Status: DC

## 2023-02-01 MED ORDER — CALCIUM ACETATE (PHOS BINDER) 667 MG PO CAPS: 1334.0000 mg | ORAL_CAPSULE | Freq: Three times a day (TID) | ORAL | 0 refills | Status: AC

## 2023-02-01 MED ORDER — PANTOPRAZOLE SODIUM 40 MG PO TBEC: 40.0000 mg | DELAYED_RELEASE_TABLET | Freq: Every day | ORAL | 0 refills | Status: DC

## 2023-02-01 NOTE — Telephone Encounter (Signed)
I agree. I am glad she is out of the hospital.

## 2023-02-01 NOTE — Telephone Encounter (Signed)
Received call from [place], RN with [facility]. Requesting VO for [facility] skilled nursing 1 week 8 to work on education around disease process, and med adherence. Verbal auth given. Will route to [facility] for agreement/denial.

## 2023-02-01 NOTE — Progress Notes (Signed)
Pt requested to wait until daughter arrived to leave. Pt daughter finally came, discharge paperwork was given to pt, medication (hydromorphone) was shown to pt and put into pt bag; pt acknowledged seeing and being aware of. Pt taken to entry where daughter was waiting for her.

## 2023-02-01 NOTE — Progress Notes (Signed)
Patient off unit for dialysis.

## 2023-02-01 NOTE — Plan of Care (Signed)
  Problem: Acute Rehab OT Goals (only OT should resolve) Goal: Pt. Will Perform Grooming Outcome: Adequate for Discharge Goal: Pt. Will Perform Lower Body Dressing Outcome: Adequate for Discharge Goal: Pt. Will Transfer To Toilet Outcome: Adequate for Discharge Goal: Pt/Caregiver Will Perform Home Exercise Program Outcome: Adequate for Discharge   Problem: Acute Rehab PT Goals(only PT should resolve) Goal: Pt Will Go Supine/Side To Sit Outcome: Adequate for Discharge Goal: Pt Will Perform Standing Balance Or Pre-Gait Outcome: Adequate for Discharge Goal: Pt Will Ambulate Outcome: Adequate for Discharge

## 2023-02-01 NOTE — Progress Notes (Signed)
PT Cancellation Note  Patient Details Name: CLORINDA WYBLE MRN: [id number] DOB: [birth year]   Cancelled Treatment:    Reason Eval/Treat Not Completed: Patient at procedure or test/unavailable (HD). Will follow up for PT treatment as schedule permits.  Ina Homes, PT, DPT Acute Rehabilitation Services  Personal: Secure Chat Rehab Office: [phone]  Malachy Chamber 02/01/2023, 7:22 AM

## 2023-02-01 NOTE — Progress Notes (Signed)
Removed the patient's IV and telemetry monitor. Notified CCMD. Attempted to call daughter Lysle Rubens to explain patient's discharge instruction per patient's request. Was given the following number [phone] by a visiting relative. Daughter didn't answer and she doesn't have voicemail setup. Will make patient's aware and will give TOC med and AVS to patient's nurse.

## 2023-02-01 NOTE — Progress Notes (Signed)
D/C order noted. Contacted [facility] SW to advise clinic of pt's d/c today and that pt should resume care on Thursday.  ? ?Audrey Hill ?Renal Navigator ?[phone] ?

## 2023-02-01 NOTE — Progress Notes (Signed)
[facility] Progress Note   Subjective:   Was unable to get extra HD today due to dialysis unit having full schedule, on HD this AM. She reports she had some epigastric pain this AM, now resolved, and is having loose bowel movements for several days. Denies SOB, CP, dizziness, HA. She is currently on O2 2L via [place] but does not feel SOB and O2 sat 99-100%.   Objective Vitals:   02/01/23 0701 02/01/23 0708 02/01/23 0727 02/01/23 0738  BP:  (!) 158/79 (!) 155/78 (!) 149/86  Pulse:  (!) 55 (!) 55 (!) 58  Resp:  (!) 22 (!) 26 (!) 23  Temp:  97.8 F (36.6 C)    TempSrc:  Oral    SpO2:  99% 99% 100%  Weight: 78.7 kg     Height:       Physical Exam General: Alert female in NAD Heart: RRR, no murmurs, rubs or gallops Lungs: CTA anteriorly Abdomen: Soft, non-distended, +BS Extremities: 1+ woody edema bilaterally Dialysis Access: AVF accessed  Additional Objective Labs: Basic Metabolic Panel: Recent Labs  Lab 01/30/23 0233 01/31/23 0048 02/01/23 0358  NA 139 134* 134*  K 3.7 3.5 3.3*  CL 99 95* 93*  CO2 31 31 29   GLUCOSE 73 85 92  BUN 30* 24* 34*  CREATININE 4.91* 4.32* 5.65*  CALCIUM 8.7* 8.6* 9.1  PHOS  --   --  4.8*   Liver Function Tests: Recent Labs  Lab 01/29/23 1421 01/30/23 0233 02/01/23 0358  AST 47* 27  --   ALT 32 25  --   ALKPHOS 136* 116  --   BILITOT 1.1 0.8  --   PROT 6.9 5.8*  --   ALBUMIN 2.9* 2.5* 2.5*   No results for input(s): "LIPASE", "AMYLASE" in the last 168 hours. CBC: Recent Labs  Lab 01/29/23 1421 01/29/23 1937 01/29/23 2134 01/30/23 0233 01/31/23 0048  WBC 5.2  --   --  4.0 4.7  HGB 8.7*   < > 9.5* 7.7* 8.0*  HCT 28.4*   < > 28.0* 25.5* 26.0*  MCV 86.9  --   --  86.7 87.2  PLT 212  --   --  192 230   < > = values in this interval not displayed.   Blood Culture    Component Value Date/Time   SDES BLOOD RIGHT FOREARM 12/02/2022 1710   SPECREQUEST  12/02/2022 1710    BOTTLES DRAWN AEROBIC AND ANAEROBIC Blood  Culture results may not be optimal due to an inadequate volume of blood received in culture bottles   CULT  12/02/2022 1710    NO GROWTH 5 DAYS Performed at [facility], [address] [place], [place] [address]    REPTSTATUS 12/07/2022 FINAL 12/02/2022 1710   Studies/Results: US Abdomen Limited RUQ (LIVER/GB)  Result Date: 01/30/2023 CLINICAL DATA:  Elevated alkaline phosphatase EXAM: ULTRASOUND ABDOMEN LIMITED RIGHT UPPER QUADRANT COMPARISON:  Yesterday FINDINGS: Gallbladder: No gallstones or wall thickening visualized. No sonographic Murphy sign noted by sonographer. Common bile duct: Diameter: 4 mm. Liver: No focal lesion identified. Within normal limits in parenchymal echogenicity. Portal vein is patent on color Doppler imaging with normal direction of blood flow towards the liver. Other: Moderate, simple ascites and hepatic cava distension. IMPRESSION: 1. Negative liver and gallbladder. 2. Moderate ascites. Electronically Signed   By: Tiburcio Pea M.D.   On: 01/30/2023 08:57   Medications:  iron sucrose      amiodarone  200 mg Oral Daily  apixaban  5 mg Oral BID   atorvastatin  20 mg Oral Daily   calcium acetate  1,334 mg Oral TID WC   carvedilol  12.5 mg Oral BID WC   Chlorhexidine Gluconate Cloth  6 each Topical Q0600   Chlorhexidine Gluconate Cloth  6 each Topical Q0600   doxercalciferol  6 mcg Intravenous Q T,Th,Sa-HD   furosemide  80 mg Oral QODAY   gabapentin  300 mg Oral q AM   hydrALAZINE  75 mg Oral TID   isosorbide dinitrate  20 mg Oral BID   losartan  100 mg Oral Daily   pantoprazole  40 mg Oral Daily   potassium chloride SA  40 mEq Oral Once   traZODone  50 mg Oral QHS    Dialysis Orders: TTS - [facility]  4hrs, BFR 400, DFR 600,  EDW 71.8kg, 2K/ 2Ca   Access: AVF  Heparin none Mircera 150 mcg q2wks - last 4/23 Venofer 100mg  qHD x10  Hectorol IV qHD    Assessment/Plan:  Abdominal pain - no acute abnormalities noted on CT or abd [place]. +ascites.  Per PMD 2. Headache - CT head negative. Resolved today.  3. Volume overload - non compliance and large gains.  Continue to counsel on fluid restrictions and importance of compliance. CXR with likely pulmonary edema and ascites on exam.  Net UF 2L last HD, still significantly above EDW with painful woody edema. Was planned for extra HD Monday but rolled over to this AM. She may be able to go home after HD today.  4.  ESRD -  on HD TTS.  See above. K+ 3.3, using added K+ bath.  5.  Hypertension  - BP elevated.On carvedilol 12.5mg  BID, isordil 20mg  qd, losartan 100mg  qd.  Takes lasix on non HD days. BP improved post HD 6.  Anemia of CKD - Hgb 8.0 - ESA recently dosed. Will continue iron.  7.  Secondary Hyperparathyroidism -  Calcium in goal.  Check phos. Continue phoslo 2AC, and hectorol 8.  Nutrition - Renal diet w/fluid restrictions. Alb 2.5 - add protein supplements.  9. Acute on chronic combined HF - volume management with HD 10. A fib - on amiodarone and eliquis 11. Chronic pain syndrome 12. Dispo: Poor insight into her ESRD and misses HD often. Now she does agree that she needs dialysis for fluid management. If noncompliance persists, will need palliative care involvement    Rogers Blocker, PA-C 02/01/2023, 8:09 AM  [facility] Pager: [phone]

## 2023-02-01 NOTE — TOC Progression Note (Signed)
Transition of Care (TOC) - Progression Note   Update Tresa Endo with [facility] on discharge today  Patient Details  Name: Audrey Hill MRN: [id number] Date of Birth: 1956/06/04  Transition of Care [facility]) CM/SW Contact  Shraddha Lebron, Adria Devon, RN Phone Number: 02/01/2023, 11:58 AM  Clinical Narrative:       Expected Discharge Plan: Home w Home Health Services Barriers to Discharge: Continued Medical Work up  Expected Discharge Plan and Services   Discharge Planning Services: CM Consult Post Acute Care Choice: Home Health Living arrangements for the past 2 months: Single Family Home Expected Discharge Date: 02/01/23               DME Arranged: N/A DME Agency: NA       HH Arranged: RN, PT, OT, Nurse's Aide, Social Work [employer] Agency: [facility] Date HH Agency Contacted: 01/31/23 Time HH Agency Contacted: 1351 Representative spoke with at [facility] Agency: Brandi   Social Determinants of Health (SDOH) Interventions SDOH Screenings   Depression (PHQ2-9): Medium Risk (11/20/2021)  Tobacco Use: Medium Risk (12/02/2022)    Readmission Risk Interventions    08/02/2022    3:40 PM  Readmission Risk Prevention Plan  Transportation Screening Complete  PCP or Specialist Appt within 5-7 Days Complete  Home Care Screening Complete  Medication Review (RN CM) Complete

## 2023-02-01 NOTE — Plan of Care (Signed)
[facility] Patient Discharge Orders- [facility] CLINIC: [name]  Patient's name: Audrey Hill Admit/DC Dates: 01/29/2023 - 02/01/23  Discharge Diagnoses: Acute on chronic HFrEF   ESRD with missed dialysis  Aranesp: Given: no   Date and amount of last dose: N/A  Last Hgb: 8.0 PRBC's Given: no Date/# of units: N/A ESA dose for discharge: mircera 150 units IV q 2 weeks  IV Iron dose at discharge: resume venofer bolus, none given here  Heparin change: No  EDW Change: No New EDW:   Bath Change: No  Access intervention/Change: No Details:  Hectorol/Calcitriol change: No  Discharge Labs: Calcium: 9.1 Phosphorus: 4.8 Albumin 2.5 K+ 3.3  IV Antibiotics: No Details:  On Coumadin?: No Last INR: Next INR: Managed By:   OTHER/APPTS/LAB ORDERS: None    D/C Meds to be reconciled by nurse after every discharge.  Completed By: Rogers Blocker, PA-C 02/01/2023, 11:58 AM  [facility] Pager: [phone]  Reviewed by: MD:______ RN_______

## 2023-02-02 ENCOUNTER — Telehealth: Payer: Self-pay

## 2023-02-02 ENCOUNTER — Other Ambulatory Visit: Payer: Self-pay

## 2023-02-02 LAB — HEPATITIS B SURFACE ANTIBODY, QUANTITATIVE: Hep B S AB Quant (Post): 790 m[IU]/mL (ref >9.9)

## 2023-02-02 NOTE — Transitions of Care (Post Inpatient/ED Visit) (Signed)
   02/02/2023  Name: Audrey Hill MRN: [id number] DOB: 1956/02/01  Today's TOC FU Call Status: Today's TOC FU Call Status:: Unsuccessul Call (1st Attempt) Unsuccessful Call (1st Attempt) Date: 02/02/23  Attempted to reach the patient regarding the most recent Inpatient/ED visit.  Follow Up Plan: Additional outreach attempts will be made to reach the patient to complete the Transitions of Care (Post Inpatient/ED visit) call.   Signature Karena Addison, LPN [facility] Nurse Health Advisor Direct Dial [phone]

## 2023-02-03 NOTE — Transitions of Care (Post Inpatient/ED Visit) (Signed)
   02/03/2023  Name: Audrey Hill MRN: [id number] DOB: 1956/08/21  Today's TOC FU Call Status: Today's TOC FU Call Status:: Unsuccessful Call (2nd Attempt) Unsuccessful Call (1st Attempt) Date: 02/02/23 Unsuccessful Call (2nd Attempt) Date: 02/03/23  Attempted to reach the patient regarding the most recent Inpatient/ED visit.  Follow Up Plan: Additional outreach attempts will be made to reach the patient to complete the Transitions of Care (Post Inpatient/ED visit) call.   Signature  Karena Addison, LPN [facility] Nurse Health Advisor Direct Dial [phone]

## 2023-02-04 ENCOUNTER — Ambulatory Visit (INDEPENDENT_AMBULATORY_CARE_PROVIDER_SITE_OTHER): Payer: Medicare HMO | Admitting: Student

## 2023-02-04 VITALS — BP 171/86 | HR 75 | Temp 98.2°F | Ht 66.0 in | Wt 163.7 lb

## 2023-02-04 DIAGNOSIS — I132 Hypertensive heart and chronic kidney disease with heart failure and with stage 5 chronic kidney disease, or end stage renal disease: Secondary | ICD-10-CM | POA: Diagnosis not present

## 2023-02-04 DIAGNOSIS — I5023 Acute on chronic systolic (congestive) heart failure: Secondary | ICD-10-CM

## 2023-02-04 DIAGNOSIS — N186 End stage renal disease: Secondary | ICD-10-CM

## 2023-02-04 DIAGNOSIS — I10 Essential (primary) hypertension: Secondary | ICD-10-CM

## 2023-02-04 DIAGNOSIS — R911 Solitary pulmonary nodule: Secondary | ICD-10-CM

## 2023-02-04 DIAGNOSIS — Z992 Dependence on renal dialysis: Secondary | ICD-10-CM

## 2023-02-04 DIAGNOSIS — R1013 Epigastric pain: Secondary | ICD-10-CM | POA: Diagnosis not present

## 2023-02-04 MED ORDER — ALBUTEROL SULFATE HFA 108 (90 BASE) MCG/ACT IN AERS: 2.0000 | INHALATION_SPRAY | Freq: Four times a day (QID) | RESPIRATORY_TRACT | 0 refills | Status: DC | PRN

## 2023-02-04 MED ORDER — LOSARTAN POTASSIUM 100 MG PO TABS: 100.0000 mg | ORAL_TABLET | Freq: Every day | ORAL | 3 refills | Status: DC

## 2023-02-04 MED ORDER — FUROSEMIDE 80 MG PO TABS: 80.0000 mg | ORAL_TABLET | ORAL | 2 refills | Status: DC

## 2023-02-04 MED ORDER — BISACODYL 5 MG PO TBEC: 5.0000 mg | DELAYED_RELEASE_TABLET | Freq: Every day | ORAL | 1 refills | Status: DC | PRN

## 2023-02-04 MED ORDER — HYDROMORPHONE HCL 2 MG PO TABS: 2.0000 mg | ORAL_TABLET | Freq: Three times a day (TID) | ORAL | 0 refills | Status: DC | PRN

## 2023-02-04 MED ORDER — PANTOPRAZOLE SODIUM 40 MG PO TBEC: 40.0000 mg | DELAYED_RELEASE_TABLET | Freq: Every day | ORAL | 0 refills | Status: DC

## 2023-02-04 NOTE — Transitions of Care (Post Inpatient/ED Visit) (Signed)
   02/04/2023  Name: Audrey Hill MRN: [id number] DOB: 12-21-1955 Patient already seen in office Today's TOC FU Call Status: Today's TOC FU Call Status:: Unsuccessful Call (2nd Attempt) Unsuccessful Call (1st Attempt) Date: 02/02/23 Unsuccessful Call (2nd Attempt) Date: 02/03/23  Attempted to reach the patient regarding the most recent Inpatient/ED visit.  Follow Up Plan: No further outreach attempts will be made at this time. We have been unable to contact the patient.  Signature Karena Addison, LPN [facility] Nurse Health Advisor Direct Dial [phone]

## 2023-02-04 NOTE — Patient Instructions (Addendum)
It was a pleasure seeing you in clinic today.   Please take dilaudid 2 mg thee times daily as needed for pain Take pantoprazole 40 mg daily  Continue gabapentin for pain   Please restart losartan 100mg  daily   Please take bisacodyl 1 tablet daily to help with constipation'  I will refill your medications  Follow up in 1-2 weeks concerned a little bit

## 2023-02-07 ENCOUNTER — Encounter: Payer: Self-pay | Admitting: Student

## 2023-02-07 ENCOUNTER — Telehealth: Payer: Self-pay | Admitting: Student

## 2023-02-07 DIAGNOSIS — R911 Solitary pulmonary nodule: Secondary | ICD-10-CM | POA: Insufficient documentation

## 2023-02-07 NOTE — Telephone Encounter (Signed)
Hydromorphone was refilled 02/04/23. Called  to inform pt but "call cannot be complete".

## 2023-02-07 NOTE — Telephone Encounter (Signed)
HYDROmorphone (DILAUDID) 2 MG table   [id number] - [place],  - [address] AT [facility] OF [address]

## 2023-02-07 NOTE — Assessment & Plan Note (Signed)
>>  ASSESSMENT AND PLAN FOR ESRD ON DIALYSIS (HCC) WRITTEN ON 02/07/2023  1:35 PM BY Quincy Simmonds, MD  Compliant with HD Tuesday, Thursday, and Saturday.  Last session was yesterday.  Has been able to tolerate HD sessions.

## 2023-02-07 NOTE — Assessment & Plan Note (Addendum)
Compliant with HD Tuesday, Thursday, and Saturday.  Last session was yesterday.  Has been able to tolerate HD sessions.

## 2023-02-07 NOTE — Assessment & Plan Note (Signed)
BP elevated to 171/86.  She did bring her medications in.  She is losartan is unsure when she last took this.  I have refilled this for her asked her to pick this up at the pharmacy. Continue carvedilol, isosorbide, hydralazine, and furosemide.

## 2023-02-07 NOTE — Progress Notes (Signed)
Established Patient Office Visit  Subjective   Patient ID: KETRA SEGARRA, female    DOB: 08/17/1956  Age: 67 y.o. MRN: [id number]  Chief Complaint  Patient presents with   Abdominal Pain   Leg Pain   Medication Refill   Insomnia   Hospitalization Follow-up    ESA CADD is a 67 y.o. person living with a history listed below who present to clinic for hospital follow up. Please refer to problem based charting for further details and assessment and plan of current problem and chronic medical conditions.     Patient Active Problem List   Diagnosis Date Noted   Lung nodule 02/07/2023   Acute on chronic HFrEF (heart failure with reduced ejection fraction) (HCC) 01/30/2023   ESRD on dialysis (HCC) 08/10/2022   Chronic bilateral back pain 08/10/2022   MDD (major depressive disorder) 08/05/2022   Chronic pain syndrome 08/02/2022   Dysphagia 12/31/2021   Abdominal pain, epigastric 12/31/2021   Insomnia 11/20/2021   Anemia of chronic disease 08/18/2021   Malnutrition of moderate degree 08/08/2021   Esophagitis    Asthma, chronic 11/13/2014   History of stroke 11/13/2014   Atrial fibrillation (HCC) 11/13/2014   Healthcare maintenance 06/18/2013   Hypertension    Hyperlipidemia    GERD (gastroesophageal reflux disease)       Review of Systems  Respiratory:  Negative for shortness of breath.   Cardiovascular:  Negative for chest pain and palpitations.  Gastrointestinal:  Positive for abdominal pain and constipation. Negative for diarrhea, nausea and vomiting.      Objective:     BP (!) 171/86 (BP Location: Right Arm, Patient Position: Sitting, Cuff Size: Small)   Pulse 75   Temp 98.2 F (36.8 C) (Oral)   Ht 5\' 6"  (1.676 m)   Wt 163 lb 11.2 oz (74.3 kg)   SpO2 100%   BMI 26.42 kg/m  BP Readings from Last 3 Encounters:  02/04/23 (!) 171/86  02/01/23 (!) 186/96  12/02/22 (!) 141/90      Physical Exam Constitutional:      Appearance: Normal  appearance.     Comments: Chronically ill appearing, mildy uncomfortable   HENT:     Mouth/Throat:     Mouth: Mucous membranes are moist.     Pharynx: Oropharynx is clear.  Cardiovascular:     Rate and Rhythm: Normal rate. Rhythm irregular.     Heart sounds: No murmur heard.    No friction rub. No gallop.     Comments: No JVD, no LE edema Pulmonary:     Effort: Pulmonary effort is normal.     Breath sounds: No wheezing, rhonchi or rales.  Abdominal:     General: Abdomen is flat. Bowel sounds are normal. There is no distension.     Palpations: Abdomen is soft.     Tenderness: There is abdominal tenderness (diffusely over entire abdomen, worst in the epigastric area.). There is no right CVA tenderness, left CVA tenderness, guarding or rebound.     Hernia: No hernia is present.  Musculoskeletal:        General: Normal range of motion.     Right lower leg: No edema.     Left lower leg: No edema.  Skin:    General: Skin is warm and dry.     Capillary Refill: Capillary refill takes less than 2 seconds.  Neurological:     General: No focal deficit present.     Mental Status: She is alert and  oriented to person, place, and time.  Psychiatric:        Mood and Affect: Mood normal.        Behavior: Behavior normal.      No results found for any visits on 02/04/23.  Last CBC Lab Results  Component Value Date   WBC 4.7 01/31/2023   HGB 8.0 (L) 01/31/2023   HCT 26.0 (L) 01/31/2023   MCV 87.2 01/31/2023   MCH 26.8 01/31/2023   RDW 18.0 (H) 01/31/2023   PLT 230 01/31/2023   Last metabolic panel Lab Results  Component Value Date   GLUCOSE 92 02/01/2023   NA 134 (L) 02/01/2023   K 3.3 (L) 02/01/2023   CL 93 (L) 02/01/2023   CO2 29 02/01/2023   BUN 34 (H) 02/01/2023   CREATININE 5.65 (H) 02/01/2023   GFRNONAA 8 (L) 02/01/2023   CALCIUM 9.1 02/01/2023   PHOS 4.8 (H) 02/01/2023   PROT 5.8 (L) 01/30/2023   ALBUMIN 2.5 (L) 02/01/2023   BILITOT 0.8 01/30/2023   ALKPHOS 116  01/30/2023   AST 27 01/30/2023   ALT 25 01/30/2023   ANIONGAP 12 02/01/2023      The ASCVD Risk score (Arnett DK, et al., 2019) failed to calculate for the following reasons:   The patient has a prior MI or stroke diagnosis    Assessment & Plan:   Problem List Items Addressed This Visit     Hypertension (Chronic)    BP elevated to 171/86.  She did bring her medications in.  She is losartan is unsure when she last took this.  I have refilled this for her asked her to pick this up at the pharmacy. Continue carvedilol, isosorbide, hydralazine, and furosemide.      Relevant Medications   furosemide (LASIX) 80 MG tablet   losartan (COZAAR) 100 MG tablet   Abdominal pain, epigastric - Primary    Has had epigastric and allover abdominal pain since recent admission.  Reports pain lasts all day.  Some improvement with oral Dilaudid but has run out.  CT imaging with ascites in setting of heart failure but otherwise no obvious cause of her epigastric pain.  Right upper quadrant ultrasound was also unremarkable.  She is not sure when she last had a bowel movement but is likely has been several days.  Pain is worse with reclining.  GERD that is contributing to her pain.  Suspect she is constipated in the setting of opioid use.  Asked her to pantoprazole and will send bisacodyl to help with constipation.  Will continue Dilaudid 2 mg every 8 hours, 3 tablets daily for pain.      ESRD on dialysis [facility])    Compliant with HD Tuesday, Thursday, and Saturday.  Last session was yesterday.  Has been able to tolerate HD sessions.        Acute on chronic HFrEF (heart failure with reduced ejection fraction) (HCC)    Admitted between 4/27 and 02/01/2023 for acute on chronic heart failure exacerbation.  Presented with increased abdominal pain and inability to finish dialysis.  Received urgent dialysis with fluid removal.  Weight at discharge was 78.7 kg.  Today her weight is 74.3 kg.  Appears euvolemic today.   Has been going to dialysis with last session yesterday.  Yesterday limiting GDMT.  Continue home carvedilol, isosorbide, hydralazine, furosemide.  She did bring all her medications today and usually losartan is missing.  Will send her a refill of this. Palliative consulted while in hospital given  multiple chronic medical conditions and ESRD.  She will continue to follow as an outpatient to discuss goals of care.      Relevant Medications   furosemide (LASIX) 80 MG tablet   losartan (COZAAR) 100 MG tablet   Lung nodule    Incidentally noted on CT during admission. Will need repeat imaging tin 3-6 months       Return in about 2 weeks (around 02/18/2023) for chronic medical conditions, DME wheelchair, follow up with blue team.    Quincy Simmonds, MD

## 2023-02-07 NOTE — Assessment & Plan Note (Addendum)
Admitted between 4/27 and 02/01/2023 for acute on chronic heart failure exacerbation.  Presented with increased abdominal pain and inability to finish dialysis.  Received urgent dialysis with fluid removal.  Weight at discharge was 78.7 kg.  Today her weight is 74.3 kg.  Appears euvolemic today.  Has been going to dialysis with last session yesterday.  Yesterday limiting GDMT.  Continue home carvedilol, isosorbide, hydralazine, furosemide.  She did bring all her medications today and usually losartan is missing.  Will send her a refill of this. Palliative consulted while in hospital given multiple chronic medical conditions and ESRD.  She will continue to follow as an outpatient to discuss goals of care.

## 2023-02-07 NOTE — Assessment & Plan Note (Signed)
Incidentally noted on CT during admission. Will need repeat imaging tin 3-6 months

## 2023-02-07 NOTE — Assessment & Plan Note (Signed)
Has had epigastric and allover abdominal pain since recent admission.  Reports pain lasts all day.  Some improvement with oral Dilaudid but has run out.  CT imaging with ascites in setting of heart failure but otherwise no obvious cause of her epigastric pain.  Right upper quadrant ultrasound was also unremarkable.  She is not sure when she last had a bowel movement but is likely has been several days.  Pain is worse with reclining.  GERD that is contributing to her pain.  Suspect she is constipated in the setting of opioid use.  Asked her to pantoprazole and will send bisacodyl to help with constipation.  Will continue Dilaudid 2 mg every 8 hours, 3 tablets daily for pain.

## 2023-02-08 ENCOUNTER — Other Ambulatory Visit: Payer: Self-pay | Admitting: *Deleted

## 2023-02-08 MED ORDER — HYDROMORPHONE HCL 2 MG PO TABS: 2.0000 mg | ORAL_TABLET | Freq: Three times a day (TID) | ORAL | 0 refills | Status: DC | PRN

## 2023-02-09 ENCOUNTER — Telehealth: Payer: Self-pay | Admitting: *Deleted

## 2023-02-09 NOTE — Telephone Encounter (Signed)
Received call from Ada, PT with [facility], stating delay for start of care for [facility] PT/OT till 5/13 or 14 2/2 staffing.

## 2023-02-14 ENCOUNTER — Telehealth: Payer: Self-pay | Admitting: *Deleted

## 2023-02-14 NOTE — Telephone Encounter (Signed)
Received call from Clydie Braun, OT with [facility]. Requesting VO for HH OT 1 week 6 to work on Print production planner, ADL training, transfer training, and home safety. Verbal auth given. Will route to PCP for agreement/denial.

## 2023-02-15 NOTE — Progress Notes (Signed)
Internal Medicine Clinic Attending  Case discussed with Dr. Elaina Pattee  At the time of the visit.  We reviewed the resident's history and exam and pertinent patient test results.  I agree with the assessment, diagnosis, and plan of care documented in the resident's note.   Challenging situation. 67yo woman with chronic abdominal pain without a clear etiology, recently admitted with abdominal pain. On her hospital admission, she was started on Dilaudid 1mg  PO TID x12 tablets, and it was recommended that she follow up with outpatient palliative care. This is challenging because I think the opioids are contributing to her constipation, which is in turn contributing to her abdominal pain. I think the ultimate goal should be to wean her off the Dilaudid.   Palliative Care referral is still pending - will ask Dr. Allena Katz to address at his 5/15 visit with this patient.

## 2023-02-16 ENCOUNTER — Encounter: Payer: Medicare HMO | Admitting: Student
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
## 2023-02-18 ENCOUNTER — Telehealth: Payer: Self-pay | Admitting: Student

## 2023-02-18 ENCOUNTER — Ambulatory Visit (INDEPENDENT_AMBULATORY_CARE_PROVIDER_SITE_OTHER): Payer: Medicare HMO | Admitting: Student

## 2023-02-18 DIAGNOSIS — G8929 Other chronic pain: Secondary | ICD-10-CM | POA: Diagnosis not present

## 2023-02-18 DIAGNOSIS — Z7409 Other reduced mobility: Secondary | ICD-10-CM | POA: Diagnosis not present

## 2023-02-18 DIAGNOSIS — M549 Dorsalgia, unspecified: Secondary | ICD-10-CM | POA: Diagnosis not present

## 2023-02-18 DIAGNOSIS — M25551 Pain in right hip: Secondary | ICD-10-CM | POA: Diagnosis not present

## 2023-02-18 NOTE — Telephone Encounter (Signed)
Returned call to [employer], PT with [facility]. Requesting VO for [facility] PT 1 week 6 to work on therapeutic exercise, fall prevention and safety, balance and gait training. Verbal auth given. Will route to PCP for agreement/denial.  Victorino Dike states that patient is c/o 9/10 right hip and leg pain. States she was barely able to sit during today's session due to the pain, and was leaning to her left. Also, requesting Rolator. Tele appt given this PM to discuss pain and need for DME.

## 2023-02-18 NOTE — Telephone Encounter (Signed)
[facility] PT [phone] Christie Beckers requesting a call back about VO.

## 2023-02-18 NOTE — Assessment & Plan Note (Signed)
Patient has chronic right hip pain. Had hip pain while working with PT today. 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 She has dilaudid 2mg  q8h PRN and states she gets relief with it but has not taken it recently. Discussed taking the dilaudid to help with pain which she is agreeable to. Of note, HH PT recommended Rolator for patient. Patient states that would help her ambulate more safely but also mentioned a motorized scooter. She has an office visit with her PCP on 5/29 to further discuss this.   Plan -Dilaudid 2 mg q8h PRN severe pain -DME referral for Rolator placed today -F/u visit on 5/29
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
## 2023-02-22 NOTE — Progress Notes (Signed)
Internal Medicine Clinic Attending  Case discussed with Dr. Zheng  At the time of the visit.  We reviewed the resident's history and exam and pertinent patient test results.  I agree with the assessment, diagnosis, and plan of care documented in the resident's note.  

## 2023-02-25 ENCOUNTER — Telehealth: Payer: Self-pay | Admitting: Student

## 2023-02-25 NOTE — Telephone Encounter (Signed)
[facility]  PT assistant Vernona Rieger Day [phone].
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
## 2023-03-02 ENCOUNTER — Encounter: Payer: Self-pay | Admitting: Student

## 2023-03-02 ENCOUNTER — Ambulatory Visit (INDEPENDENT_AMBULATORY_CARE_PROVIDER_SITE_OTHER): Payer: Medicare HMO | Admitting: Student

## 2023-03-02 VITALS — BP 178/91 | HR 70 | Temp 98.6°F | Ht 66.0 in | Wt 152.8 lb

## 2023-03-02 DIAGNOSIS — Z23 Encounter for immunization: Secondary | ICD-10-CM | POA: Diagnosis not present

## 2023-03-02 DIAGNOSIS — I5020 Unspecified systolic (congestive) heart failure: Secondary | ICD-10-CM

## 2023-03-02 DIAGNOSIS — M25551 Pain in right hip: Secondary | ICD-10-CM | POA: Diagnosis not present

## 2023-03-02 DIAGNOSIS — G894 Chronic pain syndrome: Secondary | ICD-10-CM

## 2023-03-02 DIAGNOSIS — I110 Hypertensive heart disease with heart failure: Secondary | ICD-10-CM

## 2023-03-02 DIAGNOSIS — I480 Paroxysmal atrial fibrillation: Secondary | ICD-10-CM

## 2023-03-02 DIAGNOSIS — Z7409 Other reduced mobility: Secondary | ICD-10-CM

## 2023-03-02 DIAGNOSIS — I10 Essential (primary) hypertension: Secondary | ICD-10-CM

## 2023-03-02 DIAGNOSIS — G4700 Insomnia, unspecified: Secondary | ICD-10-CM

## 2023-03-02 DIAGNOSIS — S46912A Strain of unspecified muscle, fascia and tendon at shoulder and upper arm level, left arm, initial encounter: Secondary | ICD-10-CM | POA: Insufficient documentation

## 2023-03-02 DIAGNOSIS — Z13820 Encounter for screening for osteoporosis: Secondary | ICD-10-CM | POA: Insufficient documentation

## 2023-03-02 DIAGNOSIS — Z1231 Encounter for screening mammogram for malignant neoplasm of breast: Secondary | ICD-10-CM | POA: Insufficient documentation

## 2023-03-02 MED ORDER — SERTRALINE HCL 25 MG PO TABS: 25.0000 mg | ORAL_TABLET | Freq: Every day | ORAL | 2 refills | Status: DC

## 2023-03-02 MED ORDER — APIXABAN 5 MG PO TABS: 5.0000 mg | ORAL_TABLET | Freq: Two times a day (BID) | ORAL | 1 refills | Status: DC

## 2023-03-02 MED ORDER — ATORVASTATIN CALCIUM 20 MG PO TABS: 20.0000 mg | ORAL_TABLET | Freq: Every day | ORAL | 3 refills | Status: DC

## 2023-03-02 MED ORDER — ISOSORBIDE DINITRATE 20 MG PO TABS: 20.0000 mg | ORAL_TABLET | Freq: Two times a day (BID) | ORAL | 1 refills | Status: DC

## 2023-03-02 MED ORDER — MIRTAZAPINE 7.5 MG PO TABS: 7.5000 mg | ORAL_TABLET | Freq: Every day | ORAL | 0 refills | Status: DC

## 2023-03-02 MED ORDER — AMIODARONE HCL 200 MG PO TABS: 200.0000 mg | ORAL_TABLET | Freq: Every day | ORAL | 1 refills | Status: DC

## 2023-03-02 MED ORDER — MELATONIN 3 MG PO TABS: 3.0000 mg | ORAL_TABLET | Freq: Every day | ORAL | 5 refills | Status: DC

## 2023-03-02 MED ORDER — APIXABAN 2.5 MG PO TABS: 2.5000 mg | ORAL_TABLET | Freq: Two times a day (BID) | ORAL | 1 refills | Status: DC

## 2023-03-02 MED ORDER — CARVEDILOL 12.5 MG PO TABS: 12.5000 mg | ORAL_TABLET | Freq: Two times a day (BID) | ORAL | 1 refills | Status: DC

## 2023-03-02 MED ORDER — HYDROMORPHONE HCL 2 MG PO TABS: 2.0000 mg | ORAL_TABLET | Freq: Three times a day (TID) | ORAL | 0 refills | Status: AC | PRN

## 2023-03-02 MED ORDER — TRAZODONE HCL 50 MG PO TABS: 50.0000 mg | ORAL_TABLET | Freq: Every day | ORAL | 2 refills | Status: DC

## 2023-03-02 MED ORDER — LIDOCAINE 5 % EX PTCH: 1.0000 | MEDICATED_PATCH | Freq: Every day | CUTANEOUS | 0 refills | Status: DC | PRN
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
## 2023-03-02 NOTE — Assessment & Plan Note (Signed)
-  Patient referred for mammogram 

## 2023-03-02 NOTE — Assessment & Plan Note (Signed)
Patient reports that she is on melatonin, trazodone, and Remeron for insomnia.  She has been out of her trazodone and Remeron.  She has only been taking her melatonin.  She states she has not been sleeping well due to this.  She requests a refill.  Plan: -Continue Remeron 7.5 mg nightly -Continue melatonin 3 mg daily -Continue trazodone 50 mg nightly
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
## 2023-03-02 NOTE — Assessment & Plan Note (Signed)
Likely secondary to pain.  Will order for motorized wheelchair.

## 2023-03-02 NOTE — Patient Instructions (Addendum)
Audrey Hill,Thank you for allowing me to take part in your care today.  Here are your instructions.  1.  Regarding your medications I have refilled most of your medications.  I have also given you Dilaudid for pain.  Please take your Dilaudid 2 mg every 8 hours sparingly.  You do not have to take this scheduled, but you can take this when your pain is very severe.  You can get constipated, so please continue using your Dulcolax.  2.  Regarding your Eliquis, for your 5 mg tablets, please split these in half and take 1 in the morning and 1 at night.  I have sent in a new prescription for 2.5 mg tablets.  Once you pick that up, you can take 1 whole pill in the morning and 1 whole pill at night.  3.  Please continue taking all your other medications as prescribed.  4.  Please follow-up in 1 month for pain evaluation.  I have referred you for palliative care for pain as well. I would like to discuss your high blood pressure.   5.  I have referred you for DEXA scan to screen for osteoporosis.  Please await phone call to schedule.  6.  I have referred you for mammogram.  Please await a phone call to schedule.  7.  I have updated your pneumonia vaccine today.  Thank you, Dr. Allena Katz  If you have any other questions please contact the internal medicine clinic at [phone]

## 2023-03-02 NOTE — Assessment & Plan Note (Signed)
Patient has chronic right hip pain.  This is secondary to avascular necrosis.  Patient has been dealing with this for a long time.  Patient states that the Dilaudid has been helping.  She was initially on Dilaudid for abdominal pain, but this has resolved.  She now has pain in her right hip.  Imaging showed avascular necrosis.  Patient likely will need orthopedic involvement, but given history of ESRD, heart failure, hypertension patient is likely not a good candidate for replacement.  Patient be referred for palliative care.  Plan: -Palliative care referral placed -Dilaudid refilled for 2 mg every 8 hours as needed

## 2023-03-02 NOTE — Assessment & Plan Note (Signed)
DEXA ordered.  

## 2023-03-02 NOTE — Assessment & Plan Note (Signed)
Patient with past medical history of paroxysmal atrial fibrillation.  On exam today, patient is regular rate and rhythm.  Patient currently on amiodarone 200 mg daily and Eliquis 5 mg twice daily.  Plan: -Patient meets criteria for decreased Eliquis dosing, will plan to decrease to 2.5 mg Eliquis twice daily -Continue amiodarone 200 mg daily

## 2023-03-02 NOTE — Assessment & Plan Note (Signed)
-  Patient received pneumonia vaccine today 

## 2023-03-02 NOTE — Assessment & Plan Note (Addendum)
Patient has a past medical history of HFrEF.  Patient was recently hospitalized for acute exacerbation.  Most recent echocardiogram 10/23/2022 showing ejection fraction of 25-30%.  Current medications include carvedilol 12.5 mg twice daily, Lasix 80 mg Monday, Wednesday, Friday, Sunday, hydralazine 25 mg 3 times daily, isosorbide dinitrate 20 mg twice daily, and losartan 100 mg daily.  She denies any shortness of breath, chest pain, or any other concerns today.  On exam, patient is regular rate and rhythm.  No edema appreciated on my exam.  Plan: -Continue medical management at this time -Continue Carvedilol 12.5 mg twice daily -Continue Lasix 80 mg -Continue hydralazine 25 mg 3 times daily -Continue Isosorbide dinitrate 20 mg twice daily  -Continue Losartan 100 mg daily -Continue Dialysis

## 2023-03-02 NOTE — Assessment & Plan Note (Signed)
Patient blood pressure elevated today in the 170s.  174/92.  Patient has run out of some of her medications.  Current medications include carvedilol 12.5 mg twice daily, furosemide 80 mg Monday, Wednesday, Friday, Sunday, hydralazine 25 mg 3 times daily, Isordil 25 mg twice daily, losartan 100 mg daily.  Blood pressure is uncontrolled, but patient did not have similar medications.  Plan: -Refill medication -Continue carvedilol 12.5 mg twice daily -Continue furosemide 80 mg Monday, Wednesday, Friday, Sunday -Continue hydralazine 25 mg 3 times daily -Continue Isordil 25 mg twice daily -Continue losartan 100 mg daily

## 2023-03-07 NOTE — Progress Notes (Signed)
Internal Medicine Clinic Attending  Case discussed with Dr. Patel  At the time of the visit.  We reviewed the resident's history and exam and pertinent patient test results.  I agree with the assessment, diagnosis, and plan of care documented in the resident's note.  

## 2023-03-09 ENCOUNTER — Telehealth: Payer: Self-pay | Admitting: *Deleted

## 2023-03-09 NOTE — Telephone Encounter (Signed)
We can certainly evaluate her deficits in clinic after the current [facility] visits end to see if she qualifies.

## 2023-03-09 NOTE — Telephone Encounter (Signed)
Big Approval.

## 2023-03-09 NOTE — Telephone Encounter (Addendum)
HH Aide will only be available for length of HH visits. Patient has MCD and will qualify for PCS if she has other deficits.  May also benefit from tub transfer bench.

## 2023-03-09 NOTE — Telephone Encounter (Signed)
Call from Clydie Braun OT with [facility] requesting verbal order for "[facility] Aide to assist patient with showering for safety". She states pt needs assistance getting in and out of the shower. VO given - sending to [facility] team for approval or denial.

## 2023-03-14 ENCOUNTER — Other Ambulatory Visit: Payer: Self-pay | Admitting: Student

## 2023-03-24 ENCOUNTER — Other Ambulatory Visit: Payer: Medicaid Other

## 2023-03-24 DIAGNOSIS — Z515 Encounter for palliative care: Secondary | ICD-10-CM

## 2023-03-25 NOTE — Progress Notes (Signed)
PC SW initially talked with patient regarding palliative care services. She initially agreed, then stated she couldn't talk and for SW to call her back in an hour. When SW called her back after and hour she stated she still was not available to talk. She asked SW to call back in 30 minutes. When SW called her back again, SW did not receive an answer.  SW called and left a message and has not heard back from patient. SW also attempted to call her daughter-Pansy to no avail. Patient moved to inactive list due to no communication.

## 2023-03-28 ENCOUNTER — Telehealth: Payer: Self-pay | Admitting: Student

## 2023-03-28 NOTE — Telephone Encounter (Signed)
[facility] RN Gabby B. Calling  to report the patient is Non-Compliant and they will be d/c the patient.  Joyice Faster also needs to report the reason why she is being D/C.  Gabby's Call back number is [phone].

## 2023-03-28 NOTE — Telephone Encounter (Signed)
Call from [place] from [facility].  Called to inform us that on visit to see patient n Friday to Certify for [facility] patient was upset that the Nurse was thetre.  Problems getting into home.  Patient's daughter let her in.  Patient is currently dong and witnessed by daughter Cocaine use.  [facility] was called to come out by [place].  APS to send out a SW at some point.  Gabby requested a SW consult for assessment and discuss Rehab facilities.  Verbal OK was given.  Daughter refused to have Gabby call EMS to have patient transported to the hospital for rehab.  Patient will be dismissed from [facility] on Friday as she is using Cocaine and is not safe for HH.  Message to be sent to patient's PCP for approval or denial of SW consult.

## 2023-03-30 ENCOUNTER — Other Ambulatory Visit: Payer: Self-pay | Admitting: *Deleted

## 2023-03-30 ENCOUNTER — Telehealth: Payer: Self-pay | Admitting: *Deleted

## 2023-03-30 NOTE — Telephone Encounter (Signed)
Received call from [place], PT with [facility] stating they are discharging her from all [facility] services for noncompliance and cocaine use.

## 2023-03-30 NOTE — Telephone Encounter (Signed)
Call from pt requesting Hydromorphone refill before the holidays.  Last rx written - 03/02/23. Last OV - 03/02/23. Next OV - pt stated her daughter will call back to schedule her next appt. TOX - none

## 2023-03-30 NOTE — Telephone Encounter (Signed)
Thanks for the update. Looks like palliative care has been trying to provide her with some resources but it doesn't seem like she wants their help.

## 2023-03-31 NOTE — Telephone Encounter (Signed)
Pt called / informed of refill denial and she needs to schedule an appt. Pt will have her daughter to call our office to schedule the appt.

## 2023-04-08 ENCOUNTER — Other Ambulatory Visit: Payer: Self-pay | Admitting: Student

## 2023-04-08 ENCOUNTER — Encounter: Payer: Self-pay | Admitting: Student

## 2023-04-08 ENCOUNTER — Other Ambulatory Visit: Payer: Self-pay

## 2023-04-08 ENCOUNTER — Ambulatory Visit (INDEPENDENT_AMBULATORY_CARE_PROVIDER_SITE_OTHER): Payer: Medicare HMO | Admitting: Student

## 2023-04-08 VITALS — BP 198/94 | HR 75 | Temp 98.2°F

## 2023-04-08 DIAGNOSIS — N186 End stage renal disease: Secondary | ICD-10-CM

## 2023-04-08 DIAGNOSIS — Z992 Dependence on renal dialysis: Secondary | ICD-10-CM

## 2023-04-08 DIAGNOSIS — I10 Essential (primary) hypertension: Secondary | ICD-10-CM

## 2023-04-08 DIAGNOSIS — I120 Hypertensive chronic kidney disease with stage 5 chronic kidney disease or end stage renal disease: Secondary | ICD-10-CM

## 2023-04-08 DIAGNOSIS — M25551 Pain in right hip: Secondary | ICD-10-CM

## 2023-04-08 DIAGNOSIS — G8929 Other chronic pain: Secondary | ICD-10-CM | POA: Diagnosis not present

## 2023-04-08 MED ORDER — HYDROMORPHONE HCL 2 MG PO TABS
2.0000 mg | ORAL_TABLET | Freq: Three times a day (TID) | ORAL | 0 refills | Status: DC | PRN
Start: 2023-04-08 — End: 2023-04-11

## 2023-04-08 NOTE — Assessment & Plan Note (Signed)
>>  ASSESSMENT AND PLAN FOR ESRD ON DIALYSIS (HCC) WRITTEN ON 04/08/2023 10:46 AM BY ZHENG, MICHAEL, DO  Missed HD session yesterday. Last session on Tuesday. Advised patient to go to HD tomorrow to help with volume control. She will need to f/u with nephrology regarding BP control as well.

## 2023-04-08 NOTE — Assessment & Plan Note (Signed)
Missed HD session yesterday. Last session on Tuesday. Advised patient to go to HD tomorrow to help with volume control. She will need to f/u with nephrology regarding BP control as well.

## 2023-04-08 NOTE — Patient Instructions (Addendum)
Thank you, [name] for allowing [place] to provide your care today. Today we discussed your chronic hip pain.  -Your blood pressure is high. Please make sure to take your blood pressure medicines daily. Please make sure to go to dialysis every Tuesday, Thursday and Saturday. Your blood pressure is high probably since you missed one session this week.  -Please make sure to follow with your kidney doctors.  -I have refilled your hydromorphone (Dilaudid) 2 mg every 8 hours as needed for severe pain. -Here is the contact information for Palliative Care   [facility]  [facility] Office Palliative Services Phone: [phone] -Please bring your medications to your next office visit.   I have ordered the following medication/changed the following medications:   Stop the following medications: There are no discontinued medications.   Start the following medications: Meds ordered this encounter  Medications   HYDROmorphone (DILAUDID) 2 MG tablet    Sig: Take 1 tablet (2 mg total) by mouth every 8 (eight) hours as needed for severe pain.    Dispense:  60 tablet    Refill:  0     Follow up:  2-4 weeks    Should you have any questions or concerns please call the internal medicine clinic at [phone].    Rana Snare, D.O. [facility] Center

## 2023-04-08 NOTE — Assessment & Plan Note (Addendum)
BP elevated even on repeat to 180-190/90. She denies any chest pain, shortness of breath, vision changes. Still endorses chronic hip pain. She states taking her home antihypertensives but unsure exactly which ones. She manages her medications on her own, states she is on several medications which is hard for her at times. Dialysis last on Tuesday but did not go yesterday. Plan for HD tomorrow. Suspect missing HD session contributing to elevated BP. Patient needs to f/u with nephrology.   Plan -Continue carvedilol 12.5 mg BID, Lasix 80 mg MWFSun, hydralazine 25 mg TID, Isordil 25 mg BID, losartan 100 mg daily  -Patient to bring all home meds at next visit for reconciliation

## 2023-04-08 NOTE — Assessment & Plan Note (Signed)
Chronic, secondary to AVN. Dilaudid has helped manage her pain. Denies constipation and having regular BM. Has stool softener at home if needed. Last visit, referral to palliative care placed. Appears they have tried to contact her. Encouraged patient to reach out to palliative care. Not surgical candidate due to ongoing chronic conditions of ESRD, CHF, and uncontrolled HTN.   Plan -F/u with palliative care -Dilaudid refilled 2 mg every 8 hours PRN severe pain
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
## 2023-04-11 ENCOUNTER — Telehealth: Payer: Self-pay

## 2023-04-11 DIAGNOSIS — M25551 Pain in right hip: Secondary | ICD-10-CM

## 2023-04-11 MED ORDER — HYDROMORPHONE HCL 2 MG PO TABS
2.0000 mg | ORAL_TABLET | Freq: Three times a day (TID) | ORAL | 0 refills | Status: DC | PRN
Start: 2023-04-11 — End: 2023-05-26

## 2023-04-11 NOTE — Telephone Encounter (Signed)
Reviewed, sent.

## 2023-04-11 NOTE — Telephone Encounter (Signed)
Sending request to The Attending.

## 2023-04-11 NOTE — Progress Notes (Signed)
Internal Medicine Clinic Attending  Case discussed with the resident at the time of the visit.  We reviewed the resident's history and exam and pertinent patient test results.  I agree with the assessment, diagnosis, and plan of care documented in the resident's note.  

## 2023-05-02 ENCOUNTER — Telehealth: Payer: Self-pay

## 2023-05-02 NOTE — Transitions of Care (Post Inpatient/ED Visit) (Unsigned)
   05/02/2023  Name: Audrey Hill MRN: [id number] DOB: 10/01/1956  Today's TOC FU Call Status: Today's TOC FU Call Status:: Unsuccessul Call (1st Attempt) Unsuccessful Call (1st Attempt) Date: 05/02/23  Attempted to reach the patient regarding the most recent Inpatient/ED visit.  Follow Up Plan: Additional outreach attempts will be made to reach the patient to complete the Transitions of Care (Post Inpatient/ED visit) call.   Signature Karena Addison, LPN [facility] Nurse Health Advisor Direct Dial [phone]

## 2023-05-03 NOTE — Transitions of Care (Post Inpatient/ED Visit) (Signed)
05/03/2023  Name: Audrey Hill MRN: [id number] DOB: 05-07-1956  Today's TOC FU Call Status: Today's TOC FU Call Status:: Successful TOC FU Call Competed Unsuccessful Call (1st Attempt) Date: 05/02/23 [facility] FU Call Complete Date: 05/03/23  Transition Care Management Follow-up Telephone Call Date of Discharge: 05/01/23 Discharge Facility: [facility] Type of Discharge: Inpatient Admission Primary Inpatient Discharge Diagnosis:: renal disease How have you been since you were released from the hospital?: Same Any questions or concerns?: No  Items Reviewed: Did you receive and understand the discharge instructions provided?: Yes Medications obtained,verified, and reconciled?: Yes (Medications Reviewed) Any new allergies since your discharge?: No Dietary orders reviewed?: Yes Do you have support at home?: No  Medications Reviewed Today: Medications Reviewed Today     Reviewed by Karena Addison, LPN (Licensed Practical Nurse) on 05/03/23 at 1520  Med List Status: <None>   Medication Order Taking? Sig Documenting Provider Last Dose Status Informant  albuterol (VENTOLIN HFA) 108 (90 Base) MCG/ACT inhaler [id number]  Inhale 2 puffs into the lungs every 6 (six) hours as needed (Cough). Quincy Simmonds, MD  Active   amiodarone (PACERONE) 200 MG tablet [id number]  Take 1 tablet (200 mg total) by mouth daily. Modena Slater, DO  Active   apixaban (ELIQUIS) 2.5 MG TABS tablet [id number]  Take 1 tablet (2.5 mg total) by mouth 2 (two) times daily. Modena Slater, DO  Active   atorvastatin (LIPITOR) 20 MG tablet [id number]  Take 1 tablet (20 mg total) by mouth daily. Modena Slater, DO  Active   bisacodyl (DULCOLAX) 5 MG EC tablet [id number]  Take 1 tablet (5 mg total) by mouth daily as needed for moderate constipation. Quincy Simmonds, MD  Active   carvedilol (COREG) 12.5 MG tablet [id number]  Take 1 tablet (12.5 mg total) by mouth 2 (two) times daily with a meal. Modena Slater, DO  Active    Darbepoetin Alfa (ARANESP) 100 MCG/0.5ML SOSY injection [id number] No Inject 0.5 mLs (100 mcg total) into the vein every Saturday with hemodialysis. Steffanie Rainwater, MD 01/27/2023 Active Multiple Informants  furosemide (LASIX) 80 MG tablet [id number]  Take 1 tablet (80 mg total) by mouth See admin instructions. Take 1 tablet by mouth every Monday, every Wednesday, every Friday, every Sunday take on non dialysis days Quincy Simmonds, MD  Active   hydrALAZINE (APRESOLINE) 25 MG tablet [id number] No Take 3 tablets (75 mg total) by mouth every 8 (eight) hours.  Patient taking differently: Take 25 mg by mouth 3 (three) times daily.   Augusto Gamble, MD 01/27/2023 Expired 01/30/23 2359 Multiple Informants  HYDROmorphone (DILAUDID) 2 MG tablet [id number]  Take 1 tablet (2 mg total) by mouth every 8 (eight) hours as needed for severe pain. Gust Rung, DO  Active   iron sucrose 100 mg in sodium chloride 0.9 % 100 mL [id number]  Inject 100 mg into the vein Every Tuesday,Thursday,and Saturday with dialysis. Willette Cluster, MD  Active   isosorbide dinitrate (ISORDIL) 20 MG tablet [id number]  Take 1 tablet (20 mg total) by mouth 2 (two) times daily. Modena Slater, DO  Active   lidocaine (LIDODERM) 5 % [id number]  Place 1 patch onto the skin daily as needed. Remove & Discard patch within 12 hours or as directed by MD Modena Slater, DO  Active   losartan (COZAAR) 100 MG tablet [id number]  Take 1 tablet (100 mg total) by mouth daily. Quincy Simmonds, MD  Active   melatonin 3 MG TABS tablet [id number]  Take 1 tablet (3  mg total) by mouth at bedtime. Modena Slater, DO  Active   mirtazapine (REMERON) 7.5 MG tablet [id number]  TAKE 1 TABLET(7.5 MG) BY MOUTH AT BEDTIME Modena Slater, DO  Active   multivitamin (RENA-VIT) TABS tablet [id number] No Take 1 tablet by mouth at bedtime. Steffanie Rainwater, MD 01/27/2023 Active Multiple Informants           Med Note Darryl Nestle   Thu Sep 17, 2021  9:00 PM)    pantoprazole (PROTONIX) 40  MG tablet [id number]  Take 1 tablet (40 mg total) by mouth daily. Quincy Simmonds, MD  Active   Patient not taking:  Discontinued 06/18/15 0554          Med Note [facility] [place] D   Wed Nov 13, 2014  8:59 PM) Unable to mark for removal   sertraline (ZOLOFT) 25 MG tablet [id number]  Take 1 tablet (25 mg total) by mouth daily. Modena Slater, DO  Active   traZODone (DESYREL) 50 MG tablet [id number]  Take 1 tablet (50 mg total) by mouth at bedtime. Modena Slater, DO  Active   triamcinolone ointment (KENALOG) 0.1 % [id number] No Apply 1 Application topically daily as needed (For rash). [provider] Past Week Active Multiple Informants  Med List Note Lenoria Farrier, CPhT 11/15/22 1227): Dialysis Tues., Thurs and Saturday            Home Care and Equipment/Supplies: Were Home Health Services Ordered?: NA Any new equipment or medical supplies ordered?: NA  Functional Questionnaire: Do you need assistance with bathing/showering or dressing?: Yes Do you need assistance with meal preparation?: No Do you need assistance with eating?: No Do you have difficulty maintaining continence: No Do you need assistance with getting out of bed/getting out of a chair/moving?: No Do you have difficulty managing or taking your medications?: No  Follow up appointments reviewed: PCP Follow-up appointment confirmed?: Yes Date of PCP follow-up appointment?: 05/06/23 Follow-up Provider: [facility] Follow-up appointment confirmed?: No Reason Specialist Follow-Up Not Confirmed: Patient has Specialist Provider Number and will Call for Appointment Do you need transportation to your follow-up appointment?: No Do you understand care options if your condition(s) worsen?: Yes-patient verbalized understanding    SIGNATURE Karena Addison, LPN [facility] Nurse Health Advisor Direct Dial [phone]

## 2023-05-06 ENCOUNTER — Encounter: Payer: Medicare HMO | Admitting: Student

## 2023-05-11 ENCOUNTER — Telehealth: Payer: Self-pay

## 2023-05-11 NOTE — Telephone Encounter (Signed)
Pt called our office returning  your call

## 2023-05-11 NOTE — Transitions of Care (Post Inpatient/ED Visit) (Signed)
   05/11/2023  Name: Audrey Hill MRN: [id number] DOB: 1955-10-11  Today's TOC FU Call Status: Unsuccessful Call (1st Attempt) Date: 05/11/23  Attempted to reach the patient regarding the most recent Inpatient/ED visit.  Follow Up Plan: Additional outreach attempts will be made to reach the patient to complete the Transitions of Care (Post Inpatient/ED visit) call.   Signature Karena Addison, LPN [facility] Nurse Health Advisor Direct Dial [phone]

## 2023-05-12 ENCOUNTER — Inpatient Hospital Stay: Admission: RE | Admit: 2023-05-12 | Payer: Medicare HMO | Source: Ambulatory Visit

## 2023-05-12 ENCOUNTER — Ambulatory Visit: Payer: Medicare HMO

## 2023-05-12 NOTE — Transitions of Care (Post Inpatient/ED Visit) (Signed)
05/12/2023  Name: Audrey Hill MRN: [id number] DOB: [birth year]  Today's TOC FU Call Status: Today's TOC FU Call Status:: Successful TOC FU Call Completed Unsuccessful Call (1st Attempt) Date: 05/11/23 [facility] FU Call Complete Date: 05/12/23  Transition Care Management Follow-up Telephone Call Date of Discharge: 05/10/23 Discharge Facility: [facility] Type of Discharge: Inpatient Admission Primary Inpatient Discharge Diagnosis:: encephalopathy How have you been since you were released from the hospital?: Better Any questions or concerns?: No  Items Reviewed: Did you receive and understand the discharge instructions provided?: Yes Medications obtained,verified, and reconciled?: Yes (Medications Reviewed) Any new allergies since your discharge?: No Dietary orders reviewed?: Yes Do you have support at home?: No  Medications Reviewed Today: Medications Reviewed Today     Reviewed by Karena Addison, LPN (Licensed Practical Nurse) on 05/12/23 at 1455  Med List Status: <None>   Medication Order Taking? Sig Documenting Provider Last Dose Status Informant  albuterol (VENTOLIN HFA) 108 (90 Base) MCG/ACT inhaler [id number]  Inhale 2 puffs into the lungs every 6 (six) hours as needed (Cough). Quincy Simmonds, MD  Active   amiodarone (PACERONE) 200 MG tablet [id number]  Take 1 tablet (200 mg total) by mouth daily. Modena Slater, DO  Active   apixaban (ELIQUIS) 2.5 MG TABS tablet [id number]  Take 1 tablet (2.5 mg total) by mouth 2 (two) times daily. Modena Slater, DO  Active   atorvastatin (LIPITOR) 20 MG tablet [id number]  Take 1 tablet (20 mg total) by mouth daily. Modena Slater, DO  Active   bisacodyl (DULCOLAX) 5 MG EC tablet [id number]  Take 1 tablet (5 mg total) by mouth daily as needed for moderate constipation. Quincy Simmonds, MD  Active   carvedilol (COREG) 12.5 MG tablet [id number]  Take 1 tablet (12.5 mg total) by mouth 2 (two) times daily with a meal. Modena Slater, DO  Active    Darbepoetin Alfa (ARANESP) 100 MCG/0.5ML SOSY injection [id number] No Inject 0.5 mLs (100 mcg total) into the vein every Saturday with hemodialysis. Steffanie Rainwater, MD 01/27/2023 Active Multiple Informants  furosemide (LASIX) 80 MG tablet [id number]  Take 1 tablet (80 mg total) by mouth See admin instructions. Take 1 tablet by mouth every Monday, every Wednesday, every Friday, every Sunday take on non dialysis days Quincy Simmonds, MD  Expired 05/05/23 2359   hydrALAZINE (APRESOLINE) 25 MG tablet [id number] No Take 3 tablets (75 mg total) by mouth every 8 (eight) hours.  Patient taking differently: Take 25 mg by mouth 3 (three) times daily.   Augusto Gamble, MD 01/27/2023 Expired 01/30/23 2359 Multiple Informants  HYDROmorphone (DILAUDID) 2 MG tablet [id number]  Take 1 tablet (2 mg total) by mouth every 8 (eight) hours as needed for severe pain. Gust Rung, DO  Active   iron sucrose 100 mg in sodium chloride 0.9 % 100 mL [id number]  Inject 100 mg into the vein Every Tuesday,Thursday,and Saturday with dialysis. Willette Cluster, MD  Active   isosorbide dinitrate (ISORDIL) 20 MG tablet [id number]  Take 1 tablet (20 mg total) by mouth 2 (two) times daily. Modena Slater, DO  Active   lidocaine (LIDODERM) 5 % [id number]  Place 1 patch onto the skin daily as needed. Remove & Discard patch within 12 hours or as directed by MD Modena Slater, DO  Active   losartan (COZAAR) 100 MG tablet [id number]  Take 1 tablet (100 mg total) by mouth daily. Quincy Simmonds, MD  Active   melatonin 3 MG TABS tablet [id number]  Take 1 tablet (  3 mg total) by mouth at bedtime. Modena Slater, DO  Active   mirtazapine (REMERON) 7.5 MG tablet [id number]  TAKE 1 TABLET(7.5 MG) BY MOUTH AT BEDTIME Modena Slater, DO  Active   multivitamin (RENA-VIT) TABS tablet [id number] No Take 1 tablet by mouth at bedtime. Steffanie Rainwater, MD 01/27/2023 Active Multiple Informants           Med Note Darryl Nestle   Thu Sep 17, 2021  9:00 PM)    pantoprazole  (PROTONIX) 40 MG tablet [id number]  Take 1 tablet (40 mg total) by mouth daily. Quincy Simmonds, MD  Expired 05/05/23 2359   Patient not taking:  Discontinued 06/18/15 0554          Med Note [facility] [place] D   Wed Nov 13, 2014  8:59 PM) Unable to mark for removal   sertraline (ZOLOFT) 25 MG tablet [id number]  Take 1 tablet (25 mg total) by mouth daily. Modena Slater, DO  Active   traZODone (DESYREL) 50 MG tablet [id number]  Take 1 tablet (50 mg total) by mouth at bedtime. Modena Slater, DO  Active   triamcinolone ointment (KENALOG) 0.1 % [id number] No Apply 1 Application topically daily as needed (For rash). [provider] Past Week Active Multiple Informants  Med List Note Lenoria Farrier, CPhT 11/15/22 1227): Dialysis Tues., Thurs and Saturday            Home Care and Equipment/Supplies: Were Home Health Services Ordered?: NA Any new equipment or medical supplies ordered?: NA  Functional Questionnaire: Do you need assistance with bathing/showering or dressing?: Yes Do you need assistance with meal preparation?: Yes Do you need assistance with eating?: No Do you have difficulty maintaining continence: No Do you need assistance with getting out of bed/getting out of a chair/moving?: No Do you have difficulty managing or taking your medications?: Yes  Follow up appointments reviewed: PCP Follow-up appointment confirmed?: No (no avail appts, sent message to staff to schedule) Specialist Hospital Follow-up appointment confirmed?: Yes Date of Specialist follow-up appointment?: 05/12/23 Follow-Up Specialty Provider:: dialysis Do you need transportation to your follow-up appointment?: No Do you understand care options if your condition(s) worsen?: Yes-patient verbalized understanding    SIGNATURE Karena Addison, LPN [facility] Nurse Health Advisor Direct Dial [phone]

## 2023-05-26 ENCOUNTER — Other Ambulatory Visit: Payer: Self-pay | Admitting: *Deleted

## 2023-05-26 ENCOUNTER — Encounter: Payer: Self-pay | Admitting: Student

## 2023-05-26 ENCOUNTER — Ambulatory Visit (INDEPENDENT_AMBULATORY_CARE_PROVIDER_SITE_OTHER): Payer: Medicare HMO | Admitting: Student

## 2023-05-26 VITALS — BP 164/72 | HR 65 | Temp 98.2°F | Ht 66.0 in | Wt 168.0 lb

## 2023-05-26 DIAGNOSIS — M25551 Pain in right hip: Secondary | ICD-10-CM

## 2023-05-26 DIAGNOSIS — I132 Hypertensive heart and chronic kidney disease with heart failure and with stage 5 chronic kidney disease, or end stage renal disease: Secondary | ICD-10-CM | POA: Diagnosis not present

## 2023-05-26 DIAGNOSIS — J45909 Unspecified asthma, uncomplicated: Secondary | ICD-10-CM

## 2023-05-26 DIAGNOSIS — F320 Major depressive disorder, single episode, mild: Secondary | ICD-10-CM

## 2023-05-26 DIAGNOSIS — I5020 Unspecified systolic (congestive) heart failure: Secondary | ICD-10-CM | POA: Diagnosis not present

## 2023-05-26 DIAGNOSIS — Z87891 Personal history of nicotine dependence: Secondary | ICD-10-CM

## 2023-05-26 DIAGNOSIS — K2100 Gastro-esophageal reflux disease with esophagitis, without bleeding: Secondary | ICD-10-CM

## 2023-05-26 DIAGNOSIS — E785 Hyperlipidemia, unspecified: Secondary | ICD-10-CM | POA: Diagnosis not present

## 2023-05-26 DIAGNOSIS — Z992 Dependence on renal dialysis: Secondary | ICD-10-CM

## 2023-05-26 DIAGNOSIS — I4891 Unspecified atrial fibrillation: Secondary | ICD-10-CM

## 2023-05-26 DIAGNOSIS — I10 Essential (primary) hypertension: Secondary | ICD-10-CM

## 2023-05-26 DIAGNOSIS — Z79899 Other long term (current) drug therapy: Secondary | ICD-10-CM

## 2023-05-26 DIAGNOSIS — N186 End stage renal disease: Secondary | ICD-10-CM

## 2023-05-26 DIAGNOSIS — G4700 Insomnia, unspecified: Secondary | ICD-10-CM

## 2023-05-26 LAB — COMPREHENSIVE METABOLIC PANEL
ALT: 20 U/L (ref 0–44)
AST: 22 U/L (ref 15–41)
Albumin: 2.8 g/dL — ABNORMAL LOW (ref 3.5–5.0)
Alkaline Phosphatase: 194 U/L — ABNORMAL HIGH (ref 38–126)
Anion gap: 12 (ref 5–15)
BUN: 39 mg/dL — ABNORMAL HIGH (ref 8–23)
CO2: 30 mmol/L (ref 22–32)
Calcium: 8.7 mg/dL — ABNORMAL LOW (ref 8.9–10.3)
Chloride: 97 mmol/L — ABNORMAL LOW (ref 98–111)
Creatinine, Ser: 6.19 mg/dL — ABNORMAL HIGH (ref 0.44–1.00)
GFR, Estimated: 7 mL/min — ABNORMAL LOW (ref >60)
Glucose, Bld: 71 mg/dL (ref 70–99)
Potassium: 4 mmol/L (ref 3.5–5.1)
Sodium: 139 mmol/L (ref 135–145)
Total Bilirubin: 0.8 mg/dL (ref 0.3–1.2)
Total Protein: 6.6 g/dL (ref 6.5–8.1)

## 2023-05-26 LAB — CBC
HCT: 29.3 % — ABNORMAL LOW (ref 36.0–46.0)
Hemoglobin: 9.1 g/dL — ABNORMAL LOW (ref 12.0–15.0)
MCH: 27.3 pg (ref 26.0–34.0)
MCHC: 31.1 g/dL (ref 30.0–36.0)
MCV: 88 fL (ref 80.0–100.0)
Platelets: 155 10*3/uL (ref 150–400)
RBC: 3.33 MIL/uL — ABNORMAL LOW (ref 3.87–5.11)
RDW: 20.3 % — ABNORMAL HIGH (ref 11.5–15.5)
WBC: 4.2 10*3/uL (ref 4.0–10.5)
nRBC: 0 % (ref 0.0–0.2)

## 2023-05-26 MED ORDER — ALBUTEROL SULFATE HFA 108 (90 BASE) MCG/ACT IN AERS
2.0000 | INHALATION_SPRAY | Freq: Four times a day (QID) | RESPIRATORY_TRACT | 0 refills | Status: DC | PRN
Start: 2023-05-26 — End: 2023-09-14

## 2023-05-26 MED ORDER — OXYCODONE HCL 5 MG PO TABS
5.0000 mg | ORAL_TABLET | Freq: Four times a day (QID) | ORAL | 0 refills | Status: AC | PRN
Start: 2023-05-26 — End: 2023-06-25

## 2023-05-26 MED ORDER — APIXABAN 5 MG PO TABS
5.0000 mg | ORAL_TABLET | Freq: Two times a day (BID) | ORAL | 2 refills | Status: DC
Start: 2023-05-26 — End: 2023-08-07

## 2023-05-26 MED ORDER — HYDRALAZINE HCL 25 MG PO TABS
25.0000 mg | ORAL_TABLET | Freq: Three times a day (TID) | ORAL | 2 refills | Status: DC
Start: 2023-05-26 — End: 2023-07-11

## 2023-05-26 MED ORDER — TRAZODONE HCL 50 MG PO TABS
50.0000 mg | ORAL_TABLET | Freq: Every day | ORAL | 2 refills | Status: DC
Start: 2023-05-26 — End: 2023-09-05

## 2023-05-26 MED ORDER — CARVEDILOL 3.125 MG PO TABS
3.1250 mg | ORAL_TABLET | Freq: Two times a day (BID) | ORAL | 3 refills | Status: DC
Start: 2023-05-26 — End: 2023-08-15

## 2023-05-26 MED ORDER — ATORVASTATIN CALCIUM 20 MG PO TABS
20.0000 mg | ORAL_TABLET | Freq: Every day | ORAL | 3 refills | Status: DC
Start: 2023-05-26 — End: 2023-10-29

## 2023-05-26 MED ORDER — OXYCODONE HCL 5 MG PO TABS
5.0000 mg | ORAL_TABLET | Freq: Four times a day (QID) | ORAL | 0 refills | Status: DC | PRN
Start: 2023-05-26 — End: 2023-05-26

## 2023-05-26 MED ORDER — CALCITRIOL 0.25 MCG PO CAPS
0.2500 ug | ORAL_CAPSULE | Freq: Every day | ORAL | 11 refills | Status: DC
Start: 2023-05-26 — End: 2023-05-26

## 2023-05-26 MED ORDER — FUROSEMIDE 80 MG PO TABS
80.0000 mg | ORAL_TABLET | Freq: Every day | ORAL | 2 refills | Status: DC
Start: 2023-05-26 — End: 2024-05-29

## 2023-05-26 MED ORDER — GABAPENTIN 100 MG PO CAPS
100.0000 mg | ORAL_CAPSULE | Freq: Three times a day (TID) | ORAL | 2 refills | Status: DC
Start: 2023-05-26 — End: 2023-08-07

## 2023-05-26 MED ORDER — PANTOPRAZOLE SODIUM 40 MG PO TBEC
40.0000 mg | DELAYED_RELEASE_TABLET | Freq: Every day | ORAL | 0 refills | Status: DC
Start: 2023-05-26 — End: 2023-08-06

## 2023-05-26 MED ORDER — AMIODARONE HCL 200 MG PO TABS
200.0000 mg | ORAL_TABLET | Freq: Every day | ORAL | 1 refills | Status: DC
Start: 2023-05-26 — End: 2023-11-11

## 2023-05-26 MED ORDER — ISOSORBIDE DINITRATE 20 MG PO TABS
20.0000 mg | ORAL_TABLET | Freq: Three times a day (TID) | ORAL | 1 refills | Status: DC
Start: 2023-05-26 — End: 2023-08-07

## 2023-05-26 NOTE — Assessment & Plan Note (Addendum)
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  She was previously on Dilaudid, however it was felt that this was leading to somnolence hypercapnia and polypharmacy in this patient.  On recent admission she was switched to oxycodone 5 mg every 6 hours as needed for pain.  She ran out of this medication on Tuesday.  Previous Dilaudid dose calculated to be 30 morphine milligram equivalents per day.  Current oxycodone dose also 30 morphine milligram equivalents per day.  PDMP reviewed and appropriate in this patient with chronic pain.  She feels that the oxycodone is doing an adequate job of managing her pain.  She is frustrated by her lack of mobility and attributes much of this to pain.  Plan: Oxycodone 5 mg every 6 hours as needed for pain

## 2023-05-26 NOTE — Assessment & Plan Note (Addendum)
Patient presents pitting edema to knees bilaterally.  It is difficult to assess whether this is renal or cardiogenic in nature.  Though no crackles, JVD, chest pain, dyspnea today to the point of cardiogenic origin.  She was recently hospitalized and required paracentesis.  Review of hospital records reveals that at the time was felt that her ascites was cardiogenic in nature.  On recent hospitalization there was concern for pulmonary hypertension / OSA on her echo. This may have also contributed to her hypercapnic respiratory failure.  Plan: - Continue medication regiment as described on discharge summary from [facility]. -Referral to sleep medicine

## 2023-05-26 NOTE — Assessment & Plan Note (Signed)
Denies ongoing shortness of breath, no wheezing for me on exam today.  Plan: - Refill albuterol

## 2023-05-26 NOTE — Assessment & Plan Note (Signed)
Patient on extensive medication regiment.  Recent hospitalization for hypercapnic respiratory failure, felt to be secondary to polypharmacy.  Adjustments were made to the patient's medication regiment including switching from Dilaudid to oxycodone, discontinue mirtazapine, decreasing Coreg dose.    Patient does not have a system in place in order to keep up with medications.  They currently go through their bottles every day, and set aside the medication they need to take in the morning, noon and nighttime.  Plan: - Patient would benefit from dedicated pharmacy assistance. -Patient likely benefit from pill packs, or system for medication management

## 2023-05-26 NOTE — Assessment & Plan Note (Signed)
Patient currently on trazodone nightly for sleep.  Mirtazapine discontinued at recent hospitalization for polypharmacy.  Plan: Continue trazodone 50 mg nightly for sleep.
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
## 2023-05-26 NOTE — Assessment & Plan Note (Addendum)
Patient presents with 1+ edema to knees bilaterally.  Her weight has decreased 8 pounds since her recent hospitalization, though she does not feel that her swelling has improved much.  She complains of leg tightness which makes it difficult for her to ambulate.  She has been going to dialysis 3 times weekly, and taking 80 mg furosemide daily.  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  She was discharged on Calcitrol, though corrected calcium normal for [place] today.  She brought in all her medications today, and Calcitrol was not amongst them.  Stat CBC was obtained today which demonstrated Hemoglobin improved to 9.1, increased RDW.  Stat CMP obtained today which showed mild decreased chloride 97, elevated BUN to 39, creatinine 6.19 in the setting of ESRD, calcium 8.7, albumin 2.8 (corrected calcium 9.7).  Persistent alk phos elevation at 194 likely secondary to renal disease.  Plan: - Continue dialysis 3 days a week -CMP, CBC today. -Close follow-up to monitor volume status, anemia, electrolyte abnormalities. -Will defer initiation of Calcitrol to nephrology given normal corrected serum calcium today.

## 2023-05-26 NOTE — Patient Instructions (Signed)
Thank you, Ms.Peytyn CYERA GARDE for allowing [place] to provide your care today. Today we discussed hospital follow up.    I have ordered the following labs for you:   Lab Orders         CMP14 + Anion Gap         CBC no Diff      Referrals ordered today:    Referral Orders         Ambulatory referral to Integrated Behavioral Health         Ambulatory referral to Sleep Studies         AMB Referral to Pharmacy Medication Management      I have ordered the following medication/changed the following medications:   Stop the following medications: Medications Discontinued During This Encounter  Medication Reason   carvedilol (COREG) 12.5 MG tablet    furosemide (LASIX) 80 MG tablet    apixaban (ELIQUIS) 2.5 MG TABS tablet    mirtazapine (REMERON) 7.5 MG tablet    HYDROmorphone (DILAUDID) 2 MG tablet    hydrALAZINE (APRESOLINE) 25 MG tablet Reorder   pantoprazole (PROTONIX) 40 MG tablet Reorder   albuterol (VENTOLIN HFA) 108 (90 Base) MCG/ACT inhaler Reorder   amiodarone (PACERONE) 200 MG tablet Reorder   atorvastatin (LIPITOR) 20 MG tablet Reorder   isosorbide dinitrate (ISORDIL) 20 MG tablet Reorder   traZODone (DESYREL) 50 MG tablet Reorder   carvedilol (COREG) 3.125 MG tablet Reorder   gabapentin (NEURONTIN) 100 MG capsule Reorder   oxyCODONE (OXY IR/ROXICODONE) 5 MG immediate release tablet Reorder   apixaban (ELIQUIS) 5 MG TABS tablet Reorder   furosemide (LASIX) 80 MG tablet Reorder   calcitRIOL (ROCALTROL) 0.25 MCG capsule Reorder     Start the following medications: Meds ordered this encounter  Medications   hydrALAZINE (APRESOLINE) 25 MG tablet    Sig: Take 1 tablet (25 mg total) by mouth 3 (three) times daily.    Dispense:  90 tablet    Refill:  2   isosorbide dinitrate (ISORDIL) 20 MG tablet    Sig: Take 1 tablet (20 mg total) by mouth 3 (three) times daily.    Dispense:  270 tablet    Refill:  1   amiodarone (PACERONE) 200 MG tablet    Sig: Take 1 tablet (200  mg total) by mouth daily.    Dispense:  90 tablet    Refill:  1   albuterol (VENTOLIN HFA) 108 (90 Base) MCG/ACT inhaler    Sig: Inhale 2 puffs into the lungs every 6 (six) hours as needed (Cough).    Dispense:  8 g    Refill:  0   apixaban (ELIQUIS) 5 MG TABS tablet    Sig: Take 1 tablet (5 mg total) by mouth 2 (two) times daily.    Dispense:  60 tablet    Refill:  2   atorvastatin (LIPITOR) 20 MG tablet    Sig: Take 1 tablet (20 mg total) by mouth daily.    Dispense:  90 tablet    Refill:  3   calcitRIOL (ROCALTROL) 0.25 MCG capsule    Sig: Take 1 capsule (0.25 mcg total) by mouth daily.    Dispense:  30 capsule    Refill:  11   carvedilol (COREG) 3.125 MG tablet    Sig: Take 1 tablet (3.125 mg total) by mouth 2 (two) times daily with a meal.    Dispense:  60 tablet    Refill:  3   furosemide (LASIX) 80  MG tablet    Sig: Take 1 tablet (80 mg total) by mouth daily.    Dispense:  90 tablet    Refill:  2   gabapentin (NEURONTIN) 100 MG capsule    Sig: Take 1 capsule (100 mg total) by mouth 3 (three) times daily.    Dispense:  90 capsule    Refill:  2   oxyCODONE (OXY IR/ROXICODONE) 5 MG immediate release tablet    Sig: Take 1 tablet (5 mg total) by mouth every 6 (six) hours as needed.    Dispense:  120 tablet    Refill:  0   pantoprazole (PROTONIX) 40 MG tablet    Sig: Take 1 tablet (40 mg total) by mouth daily.    Dispense:  90 tablet    Refill:  0    **Patient requests 90 days supply**   traZODone (DESYREL) 50 MG tablet    Sig: Take 1 tablet (50 mg total) by mouth at bedtime.    Dispense:  30 tablet    Refill:  2     Follow up:  2-4 weeks    We look forward to seeing you next time. Please call our clinic at [phone] if you have any questions or concerns. The best time to call is Monday-Friday from 9am-4pm, but there is someone available 24/7. If after hours or the weekend, call the main hospital number and ask for the Internal Medicine Resident On-Call. If you  need medication refills, please notify your pharmacy one week in advance and they will send [place] a request.   Thank you for trusting me with your care. Wishing you the best!  Lovie Macadamia MD [facility]

## 2023-05-26 NOTE — Assessment & Plan Note (Signed)
Patient on extensive antihypertensive regimen including carvedilol 3.125 twice daily, furosemide 80 daily, hydralazine 25 twice daily, Isodril 20 3 times daily, Cozaar 100 daily.  Patient hypertensive to 164/72 for [place] today in office, has not taken blood pressure medication today.  Plan: Follow-up CMP to assess for electrolyte abnormalities in setting of antihypertensive use.

## 2023-05-26 NOTE — Assessment & Plan Note (Signed)
>>  ASSESSMENT AND PLAN FOR ESRD ON DIALYSIS (HCC) WRITTEN ON 05/26/2023 12:52 PM BY Lovie Macadamia, MD  Patient presents with 1+ edema to knees bilaterally.  Her weight has decreased 8 pounds since her recent hospitalization, though she does not feel that her swelling has improved much.  She complains of leg tightness which makes it difficult for her to ambulate.  She has been going to dialysis 3 times weekly, and taking 80 mg furosemide daily.  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  She was discharged on Calcitrol, though corrected calcium normal for [place] today.  She brought in all her medications today, and Calcitrol was not amongst them.  Stat CBC was obtained today which demonstrated Hemoglobin improved to 9.1, increased RDW.  Stat CMP obtained today which showed mild decreased chloride 97, elevated BUN to 39, creatinine 6.19 in the setting of ESRD, calcium 8.7, albumin 2.8 (corrected calcium 9.7).  Persistent alk phos elevation at 194 likely secondary to renal disease.  Plan: - Continue dialysis 3 days a week -CMP, CBC today. -Close follow-up to monitor volume status, anemia, electrolyte abnormalities. -Will defer initiation of Calcitrol to nephrology given normal corrected serum calcium today.

## 2023-05-26 NOTE — Assessment & Plan Note (Signed)
Patient with history of A-fib on DOAC for VTE prophylaxis.  Previously on Eliquis 2.5 twice daily, however was discharged on Eliquis 5 mg twice daily after recent hospitalization.  Plan: - Continue Eliquis 5 mg twice daily

## 2023-05-26 NOTE — Assessment & Plan Note (Signed)
Patient says she recently lost her brother and has felt quite sad about this.  She denies anhedonia, and has difficulty sleeping at baseline.  It is difficult to know how much of her depressed function is due to physical ailments versus psychological.  Plan: - Continue sertraline 25 mg daily - Referral to integrative behavioral health for counseling.

## 2023-06-01 ENCOUNTER — Telehealth: Payer: Self-pay

## 2023-06-01 NOTE — Telephone Encounter (Signed)
error 

## 2023-06-01 NOTE — Progress Notes (Signed)
   Care Guide Note  06/01/2023 Name: Audrey Hill MRN: [id number] DOB: [birth year]  Referred by: Modena Slater, DO Reason for referral : Care Coordination (Outreach to schedule with Pharm d )   Audrey Hill is a 67 y.o. year old female who is a primary care patient of Modena Slater, DO. Audrey Hill was referred to the pharmacist for assistance related to Atrial Fibrillation, HTN, and HLD.    Successful contact was made with the patient to discuss pharmacy services including being ready for the pharmacist to call at least 5 minutes before the scheduled appointment time, to have medication bottles and any blood sugar or blood pressure readings ready for review. The patient agreed to meet with the pharmacist via with the pharmacist via telephone visit on (date/time).  06/02/2023  Penne Lash, RMA Care Guide [facility]  [place], [place] [address] Direct Dial: [phone] [email or web address]

## 2023-06-02 ENCOUNTER — Other Ambulatory Visit: Payer: Medicare HMO | Admitting: Pharmacist

## 2023-06-02 NOTE — Progress Notes (Unsigned)
   06/02/2023 Name: Audrey Hill MRN: [id number] DOB: 10/29/1955  No chief complaint on file.   Audrey Hill is a 67 y.o. year old female who presented for a telephone visit.   They were referred to the pharmacist by their PCP for assistance in managing complex medication management.    Subjective:  Care Team: Primary Care Provider: Modena Slater, DO ; Next Scheduled Visit: *** {careteamprovider:27366}  Medication Access/Adherence  Current Pharmacy:  [id number] - [place], [place] - [address] AT [facility] [address] [place] [address] Phone: [phone] Fax: [phone]  [facility] [address] [place], [place] - [address] [facility] [address] [place] [address] Phone: [phone] Fax: [phone]  [facility] [address] [place] [address] Phone: [phone] Fax: [phone]  [facility] [place] - [place], [place] - [address] [place] [address] Phone: [phone] Fax: [phone]   Patient reports affordability concerns with their medications: {YES/NO:21197} Patient reports access/transportation concerns to their pharmacy: {YES/NO:21197} Patient reports adherence concerns with their medications:  {YES/NO:21197} ***   {Pharmacy S/O Choices:26420}   Objective:  Lab Results  Component Value Date   HGBA1C 5.6 08/03/2021    Lab Results  Component Value Date   CREATININE 6.19 (H) 05/26/2023   BUN 39 (H) 05/26/2023   NA 139 05/26/2023   K 4.0 05/26/2023   CL 97 (L) 05/26/2023   CO2 30 05/26/2023    Lab Results  Component Value Date   CHOL 113 08/05/2022   HDL 72 08/05/2022   LDLCALC 34 08/05/2022   TRIG 33 08/05/2022   CHOLHDL 1.6 08/05/2022    Medications Reviewed Today   Medications were not reviewed in this encounter       Assessment/Plan:   {Pharmacy A/P  Choices:26421}  Follow Up Plan: ***  ***

## 2023-06-03 ENCOUNTER — Telehealth: Payer: Self-pay | Admitting: Pharmacist

## 2023-06-03 ENCOUNTER — Other Ambulatory Visit: Payer: Medicare HMO | Admitting: Pharmacist

## 2023-06-03 NOTE — Progress Notes (Signed)
Attempted to contact patient for scheduled appointment for medication management. This is patient's 2nd reschedule. Patient states now is not a good time, has a social service with her at home. Will attempt callback later today.  Lynnda Shields, PharmD, BCPS Clinical Pharmacist [facility]

## 2023-06-03 NOTE — Progress Notes (Signed)
Third attempt to contact patient for scheduled appointment for medication management. Patient did not answer, left HIPAA compliant voicemail to return my call at their convenience.  Lynnda Shields, PharmD, BCPS Clinical Pharmacist [facility]

## 2023-06-03 NOTE — Progress Notes (Unsigned)
   06/03/2023 Name: Audrey Hill MRN: [id number] DOB: 1956/08/14  No chief complaint on file.   Audrey Hill is a 67 y.o. year old female who presented for a telephone visit.   They were referred to the pharmacist by their PCP for assistance in managing complex medication management.    Subjective:  Care Team: Primary Care Provider: Modena Slater, DO ; Next Scheduled Visit: *** {careteamprovider:27366}  Medication Access/Adherence  Current Pharmacy:  [id number] - [place], [place] - [address] AT [facility] OF [address] [place] [address] Phone: [phone] Fax: [phone]  [facility] [address] [place], [place] - [address] [facility] [address] [place] [address] Phone: [phone] Fax: [phone]  [facility] [address] [place] [address] Phone: [phone] Fax: [phone]  [facility] [place] - [place], [place] - [address] [place] [address] Phone: [phone] Fax: [phone]   Patient reports affordability concerns with their medications: {YES/NO:21197} Patient reports access/transportation concerns to their pharmacy: {YES/NO:21197} Patient reports adherence concerns with their medications:  {YES/NO:21197} ***   Medication Management:  Current adherence strategy: ***  Patient reports {Good Fair [phone]} adherence to medications  Patient reports the following barriers to adherence: ***  Recent fill dates:    Objective:  Lab Results  Component Value Date   HGBA1C 5.6 08/03/2021    Lab Results  Component Value Date   CREATININE 6.19 (H) 05/26/2023   BUN 39 (H) 05/26/2023   NA 139 05/26/2023   K 4.0 05/26/2023   CL 97 (L) 05/26/2023   CO2 30 05/26/2023    Lab Results  Component Value Date   CHOL 113 08/05/2022   HDL 72 08/05/2022   LDLCALC 34 08/05/2022   TRIG 33  08/05/2022   CHOLHDL 1.6 08/05/2022    Medications Reviewed Today   Medications were not reviewed in this encounter       Assessment/Plan:   Medication Management: - Currently strategy {sufficient/insufficient:26424} to maintain appropriate adherence to prescribed medication regimen - Suggested use of weekly pill box to organize medications - Created list of medication, indication, and administration time. Provided to patient - Discussed collaboration with local pharmacies for adherence packaging. Reviewed local pharmacies with adherence packaging options. Patient elects to ***    Follow Up Plan: ***  ***

## 2023-06-14 ENCOUNTER — Telehealth: Payer: Self-pay | Admitting: Pharmacist

## 2023-06-14 ENCOUNTER — Other Ambulatory Visit: Payer: Medicare HMO | Admitting: Pharmacist

## 2023-06-14 NOTE — Progress Notes (Signed)
Attempted x3 to contact patient for scheduled appointment for medication management. Left two HIPAA compliant messages for patient to return my call at their convenience.   Will route to careguide for reschedule.  Lynnda Shields, PharmD, BCPS Clinical Pharmacist [facility]

## 2023-06-19 ENCOUNTER — Other Ambulatory Visit: Payer: Self-pay | Admitting: Student

## 2023-06-20 ENCOUNTER — Telehealth: Payer: Self-pay

## 2023-06-20 NOTE — Progress Notes (Signed)
Care Coordination Note  06/20/2023 Name: Audrey Hill MRN: [id number] DOB: 09-18-1956  Audrey Hill is a 68 y.o. year old female who is a primary care patient of Audrey Slater, DO and is actively engaged with the Chronic Care Management team. I reached out to Audrey Hill by phone today to assist with re-scheduling an initial visit with the Pharmacist  Follow up plan: Unsuccessful telephone outreach attempt made. A HIPAA compliant phone message was left for the patient providing contact information and requesting a return call.  If patient returns call to provider office, please advise to call CCM Care Guide Audrey Hill  at [phone]  Audrey Hill, RMA Care Guide [facility]  [place], [place] [address] Direct Dial: [phone] [email or web address]

## 2023-06-22 NOTE — Progress Notes (Signed)
Care Coordination Note  06/22/2023 Name: DOREN LAMOTTE MRN: [id number] DOB: 1956-02-24  Audrey Hill is a 67 y.o. year old female who is a primary care patient of Modena Slater, DO and is actively engaged with the Chronic Care Management team. I reached out to [name] by phone today to assist with re-scheduling an initial visit with the Pharmacist  Follow up plan: Unsuccessful telephone outreach attempt made. A HIPAA compliant phone message was left for the patient providing contact information and requesting a return call.  If patient returns call to provider office, please advise to call CCM Care Guide Penne Lash  at [phone]  Penne Lash, RMA Care Guide [facility]  [place], [place] [address] Direct Dial: [phone] [email or web address]

## 2023-06-23 ENCOUNTER — Encounter: Payer: Medicare HMO | Admitting: Student
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
## 2023-06-23 NOTE — Progress Notes (Signed)
Internal Medicine Clinic Attending  I was physically present during the key portions of the resident provided service and participated in the medical decision making of patient's management care. I reviewed pertinent patient test results.  The assessment, diagnosis, and plan were formulated together and I agree with the documentation in the resident's note.  Mercie Eon, MD

## 2023-06-27 NOTE — Telephone Encounter (Signed)
Discontinued on 05/26/23, see [facility] note.

## 2023-06-27 NOTE — Progress Notes (Signed)
Care Coordination Note  06/27/2023 Name: HUYEN JUNGERS MRN: [id number] DOB: 04/10/1956  [name] is a 67 y.o. year old female who is a primary care patient of Modena Slater, DO and is actively engaged with the Chronic Care Management team. I reached out to [name] by phone today to assist with re-scheduling an initial visit with the Pharmacist  Follow up plan: Telephone appointment with care management team member scheduled for:06/29/2023  Penne Lash, RMA Care Guide [facility]  [place], [place] [address] Direct Dial: [phone] [email or web address]

## 2023-06-27 NOTE — Telephone Encounter (Signed)
Please address rx refill.

## 2023-06-29 ENCOUNTER — Other Ambulatory Visit: Payer: Medicare HMO | Admitting: Pharmacist

## 2023-06-29 DIAGNOSIS — I5020 Unspecified systolic (congestive) heart failure: Secondary | ICD-10-CM

## 2023-06-29 NOTE — Progress Notes (Signed)
06/29/2023 Name: Audrey Hill MRN: [id number] DOB: 09-25-1956  Chief Complaint  Patient presents with   Medication Management    Audrey Hill is a 67 y.o. year old female who presented for a telephone visit.   They were referred to the pharmacist by their PCP for assistance in managing hypertension and complex medication management.   Last Thursday, BP low at dialysis, was told to hold hydralazine until next dr appt. Attends Tues/Thurs/Sat dialysis.  Reviewed medication list and identified the following gaps:   Losartan 50mg  daily (100mg  on list?) Patient is unsure of dosing.  Voltaren gel - she is out and requesting more Lidocaine patch - out and requesting more Oxycodone - will be out soon, requesting more. Not on medication list, she did not provide her dosing. Sertraline 25mg  daily - out of it.   Subjective:  Care Team: Primary Care Provider: Modena Slater, DO   Medication Access/Adherence  Current Pharmacy:  [id number] - [place], [place] - [address] [facility] [address] [place] [address] Phone: [phone] Fax: [phone]  [facility] [address] [place], [place] - [address] [facility] [address] [address] [place] [address] Phone: [phone] Fax: [phone]  [facility] [address] [place] [address] Phone: [phone] Fax: [phone]  [facility] [place] - [place], [place] - [address] [place] [address] Phone: [phone] Fax: [phone]   Patient reports affordability concerns with their medications: No  Patient reports access/transportation concerns to their pharmacy: No  Patient reports adherence concerns with their medications:   patient denies adherence concerns though I question adequate managmenet     Hypertension:  Current medications: isosorbide dinitrate 20mg  TID,  carvedilol 3.125mg  BID, losartan (though question if taking), hydralazine (temporarily was told to hold medication due to low BP at dialysis), furosemide 80mg  daily Patient does not have a validated, automated, upper arm home BP cuff Current blood pressure readings readings: 160/79, yesterday, wrist cuff  Patient reports s/sx including dizziness, lightheadedness, headache.    Medication Management:  Current adherence strategy: pill box  Patient reports Good adherence to medications  Patient reports the following barriers to adherence: none, per patient.    Objective:  Lab Results  Component Value Date   HGBA1C 5.6 08/03/2021    Lab Results  Component Value Date   CREATININE 6.19 (H) 05/26/2023   BUN 39 (H) 05/26/2023   NA 139 05/26/2023   K 4.0 05/26/2023   CL 97 (L) 05/26/2023   CO2 30 05/26/2023    Lab Results  Component Value Date   CHOL 113 08/05/2022   HDL 72 08/05/2022   LDLCALC 34 08/05/2022   TRIG 33 08/05/2022   CHOLHDL 1.6 08/05/2022    Medications Reviewed Today     Reviewed by Gabriel Carina, RPH (Pharmacist) on 06/29/23 at 1332  Med List Status: <None>   Medication Order Taking? Sig Documenting Provider Last Dose Status Informant  albuterol (VENTOLIN HFA) 108 (90 Base) MCG/ACT inhaler [id number] Yes Inhale 2 puffs into the lungs every 6 (six) hours as needed (Cough). Lovie Macadamia, MD Taking Active   amiodarone (PACERONE) 200 MG tablet [id number] Yes Take 1 tablet (200 mg total) by mouth daily. Lovie Macadamia, MD Taking Active   apixaban (ELIQUIS) 5 MG TABS tablet [id number] Yes Take 1 tablet (5 mg total) by mouth 2 (two)  times daily. Lovie Macadamia, MD Taking Active   atorvastatin (LIPITOR) 20 MG tablet [id number] Yes Take 1 tablet (20 mg total) by mouth daily. Lovie Macadamia, MD Taking Active   bisacodyl (DULCOLAX) 5 MG EC tablet [id number] Yes Take 1 tablet (5 mg total) by mouth daily as needed for moderate constipation. Quincy Simmonds,  MD Taking Active   carvedilol (COREG) 3.125 MG tablet [id number] Yes Take 1 tablet (3.125 mg total) by mouth 2 (two) times daily with a meal. Lovie Macadamia, MD Taking Active   Darbepoetin Alfa (ARANESP) 100 MCG/0.5ML SOSY injection [id number] Yes Inject 0.5 mLs (100 mcg total) into the vein every Saturday with hemodialysis. Steffanie Rainwater, MD Taking Active Multiple Informants  furosemide (LASIX) 80 MG tablet [id number] Yes Take 1 tablet (80 mg total) by mouth daily. Lovie Macadamia, MD Taking Active   gabapentin (NEURONTIN) 100 MG capsule [id number] Yes Take 1 capsule (100 mg total) by mouth 3 (three) times daily. Lovie Macadamia, MD Taking Active   hydrALAZINE (APRESOLINE) 25 MG tablet [id number]  Take 1 tablet (25 mg total) by mouth 3 (three) times daily. Lovie Macadamia, MD  Expired 06/25/23 2359   iron sucrose 100 mg in sodium chloride 0.9 % 100 mL [id number]  Inject 100 mg into the vein Every Tuesday,Thursday,and Saturday with dialysis. Willette Cluster, MD  Active   isosorbide dinitrate (ISORDIL) 20 MG tablet [id number] Yes Take 1 tablet (20 mg total) by mouth 3 (three) times daily. Lovie Macadamia, MD Taking Active   lidocaine (LIDODERM) 5 % [id number]  Place 1 patch onto the skin daily as needed. Remove & Discard patch within 12 hours or as directed by MD Modena Slater, DO  Active   losartan (COZAAR) 100 MG tablet [id number]  Take 1 tablet (100 mg total) by mouth daily. Quincy Simmonds, MD  Active   melatonin 3 MG TABS tablet [id number] Yes Take 1 tablet (3 mg total) by mouth at bedtime. Modena Slater, DO Taking Active   multivitamin (RENA-VIT) TABS tablet [id number] Yes Take 1 tablet by mouth at bedtime. Steffanie Rainwater, MD Taking Active Multiple Informants           Med Note Darryl Nestle   Thu Sep 17, 2021  9:00 PM)    pantoprazole (PROTONIX) 40 MG tablet [id number] Yes Take 1 tablet (40 mg total) by mouth daily. Lovie Macadamia, MD Taking Active    Patient not taking:    Discontinued 06/18/15 0554          Med Note [facility] [place] D   Wed Nov 13, 2014  8:59 PM) Unable to mark for removal   sertraline (ZOLOFT) 25 MG tablet [id number]  Take 1 tablet (25 mg total) by mouth daily. Modena Slater, DO  Expired 05/31/23 2359   traZODone (DESYREL) 50 MG tablet [id number] Yes Take 1 tablet (50 mg total) by mouth at bedtime. Lovie Macadamia, MD Taking Active   triamcinolone ointment (KENALOG) 0.1 % [id number]  Apply 1 Application topically daily as needed (For rash). [provider]  Active Multiple Informants  Med List Note Lenoria Farrier, CPhT 11/15/22 1227): Dialysis Tues., Thurs and Saturday              Assessment/Plan:   Hypertension: - Currently uncontrolled - Reviewed long term cardiovascular and renal outcomes of uncontrolled blood pressure - Reviewed appropriate blood pressure monitoring technique and reviewed goal blood pressure. Recommended to check home blood pressure and heart rate daily - Recommend to contact number on back of medicare card, explained  she will be able to get a validated upper arm BP cuff with her OTC benefits. Instructed patient to check BP and heart rate, write down, will discuss in 1 week, with her goal of reducing/streamlining medications and addressing polypharmacy.     Medication Management: - Currently strategy insufficient to maintain appropriate adherence to prescribed medication regimen - Reinforced use of weekly pill box to organize medications - Discussed collaboration with local pharmacies for adherence packaging. Reviewed local pharmacies with adherence packaging options. Patient elects to continue her current medication strategy and current pharmacy, she declines adherence packaging.   Follow Up Plan: 1 week to review blood pressure and heart rate numbers if patient completes home monitoring.  Lynnda Shields, PharmD, BCPS Clinical Pharmacist [facility]

## 2023-06-30 ENCOUNTER — Encounter: Payer: Self-pay | Admitting: Student

## 2023-06-30 MED ORDER — DICLOFENAC SODIUM 1 % EX GEL: 4.0000 g | Freq: Four times a day (QID) | CUTANEOUS | 2 refills | Status: DC

## 2023-06-30 MED ORDER — LIDOCAINE 5 % EX PTCH: 1.0000 | MEDICATED_PATCH | Freq: Every day | CUTANEOUS | 0 refills | Status: DC | PRN

## 2023-06-30 NOTE — Addendum Note (Signed)
Addended by: Modena Slater on: 06/30/2023 02:55 PM   Modules accepted: Orders

## 2023-07-04 ENCOUNTER — Ambulatory Visit (INDEPENDENT_AMBULATORY_CARE_PROVIDER_SITE_OTHER): Payer: Medicare HMO | Admitting: Licensed Clinical Social Worker

## 2023-07-04 DIAGNOSIS — I10 Essential (primary) hypertension: Secondary | ICD-10-CM

## 2023-07-04 NOTE — BH Specialist Note (Signed)
>  [facility] in error contacted patient early -3Pm and call went straight to VM. [facility] left a VM. [facility] will attempt patient again at scheduled time- 3:30pm  Christen Butter, MSW, LCSW-A She/Her Behavioral Health Clinician [facility]  Internal Medicine Center Direct Dial:[phone]  Fax [phone] Main Office Phone: [phone] [address] [place], [place] [address] Website: [facility] Internal Medicine [facility]  [place], [place]  [place]

## 2023-07-06 ENCOUNTER — Telehealth: Payer: Self-pay | Admitting: Pharmacist

## 2023-07-06 ENCOUNTER — Other Ambulatory Visit: Payer: Medicare HMO | Admitting: Pharmacist

## 2023-07-06 NOTE — Progress Notes (Signed)
Attempted to contact patient x2 for scheduled appointment for medication management. Phone went immediately to voicemail. Left HIPAA compliant message for patient to return my call at their convenience.   Lynnda Shields, PharmD, BCPS Clinical Pharmacist [facility] Primary Care

## 2023-07-06 NOTE — Progress Notes (Unsigned)
07/06/2023 Name: Audrey Hill MRN: [id number] DOB: [birth year]  No chief complaint on file.   Audrey Hill is a 67 y.o. year old female who presented for a telephone visit.   They were referred to the pharmacist by their PCP for assistance in managing hypertension and complex medication management.   Last Thursday, BP low at dialysis, was told to hold hydralazine until next dr appt. Attends Tues/Thurs/Sat dialysis.  Reviewed medication list and identified the following gaps:   Losartan 50mg  daily (100mg  on list?) Patient is unsure of dosing.  Voltaren gel - she is out and requesting more Lidocaine patch - out and requesting more Oxycodone - will be out soon, requesting more. Not on medication list, she did not provide her dosing. Sertraline 25mg  daily - out of it.   Subjective:  Care Team: Primary Care Provider: Modena Slater, DO   Medication Access/Adherence  Current Pharmacy:  [id number] - [place], [place] - [address] AT [facility] [address] [place] [address] Phone: [phone] Fax: [phone]  [facility] [address] [place], [place] - [address] [facility] [address] [address] [address] [place] [address] Phone: [phone] Fax: [phone]  [facility] [address] [place] [address] Phone: [phone] Fax: [phone]  [facility] [place] - [place], [place] - [address] [place] [address] Phone: [phone] Fax: [phone]   Patient reports affordability concerns with their medications: No  Patient reports access/transportation concerns to their pharmacy: No  Patient reports adherence concerns with their medications:   patient denies adherence concerns though I question adequate managmenet     Hypertension:  Current medications: isosorbide dinitrate 20mg  TID, carvedilol 3.125mg  BID, losartan (though  question if taking), hydralazine (temporarily was told to hold medication due to low BP at dialysis), furosemide 80mg  daily Patient does not have a validated, automated, upper arm home BP cuff Current blood pressure readings readings: 160/79, yesterday, wrist cuff  Patient reports s/sx including dizziness, lightheadedness, headache.    Medication Management:  Current adherence strategy: pill box  Patient reports Good adherence to medications  Patient reports the following barriers to adherence: none, per patient.    Objective:  Lab Results  Component Value Date   HGBA1C 5.6 08/03/2021    Lab Results  Component Value Date   CREATININE 6.19 (H) 05/26/2023   BUN 39 (H) 05/26/2023   NA 139 05/26/2023   K 4.0 05/26/2023   CL 97 (L) 05/26/2023   CO2 30 05/26/2023    Lab Results  Component Value Date   CHOL 113 08/05/2022   HDL 72 08/05/2022   LDLCALC 34 08/05/2022   TRIG 33 08/05/2022   CHOLHDL 1.6 08/05/2022    Medications Reviewed Today   Medications were not reviewed in this encounter       Assessment/Plan:   Hypertension: - Currently uncontrolled - Reviewed long term cardiovascular and renal outcomes of uncontrolled blood pressure - Reviewed appropriate blood pressure monitoring technique and reviewed goal blood pressure. Recommended to check home blood pressure and heart rate daily - Recommend to contact number on back of medicare card, explained she will be able to get a validated upper arm BP cuff with her OTC benefits. Instructed patient to check BP and heart rate, write down, will discuss in 1 week, with her goal of reducing/streamlining medications and addressing polypharmacy.  Medication Management: - Currently strategy insufficient to maintain appropriate adherence to prescribed medication regimen - Reinforced use of weekly pill box to organize medications - Discussed collaboration with local pharmacies for adherence packaging. Reviewed local  pharmacies with adherence packaging options. Patient elects to continue her current medication strategy and current pharmacy, she declines adherence packaging.   Follow Up Plan: 1 week to review blood pressure and heart rate numbers if patient completes home monitoring.  Lynnda Shields, PharmD, BCPS Clinical Pharmacist [facility] Primary [facility]

## 2023-07-11 ENCOUNTER — Ambulatory Visit: Payer: Medicare HMO | Admitting: *Deleted

## 2023-07-11 ENCOUNTER — Ambulatory Visit: Payer: Medicare HMO | Admitting: Internal Medicine

## 2023-07-11 ENCOUNTER — Telehealth: Payer: Self-pay | Admitting: *Deleted

## 2023-07-11 ENCOUNTER — Encounter: Payer: Self-pay | Admitting: *Deleted

## 2023-07-11 ENCOUNTER — Encounter: Payer: Self-pay | Admitting: Internal Medicine

## 2023-07-11 VITALS — BP 151/61 | HR 56 | Wt 147.5 lb

## 2023-07-11 VITALS — BP 151/61 | HR 56 | Wt 147.4 lb

## 2023-07-11 DIAGNOSIS — I110 Hypertensive heart disease with heart failure: Secondary | ICD-10-CM

## 2023-07-11 DIAGNOSIS — G4700 Insomnia, unspecified: Secondary | ICD-10-CM

## 2023-07-11 DIAGNOSIS — M25551 Pain in right hip: Secondary | ICD-10-CM | POA: Diagnosis not present

## 2023-07-11 DIAGNOSIS — Z23 Encounter for immunization: Secondary | ICD-10-CM | POA: Diagnosis not present

## 2023-07-11 DIAGNOSIS — H6692 Otitis media, unspecified, left ear: Secondary | ICD-10-CM

## 2023-07-11 DIAGNOSIS — Z0000 Encounter for general adult medical examination without abnormal findings: Secondary | ICD-10-CM | POA: Diagnosis not present

## 2023-07-11 DIAGNOSIS — R0689 Other abnormalities of breathing: Secondary | ICD-10-CM

## 2023-07-11 DIAGNOSIS — I5020 Unspecified systolic (congestive) heart failure: Secondary | ICD-10-CM | POA: Diagnosis not present

## 2023-07-11 DIAGNOSIS — I10 Essential (primary) hypertension: Secondary | ICD-10-CM

## 2023-07-11 DIAGNOSIS — H6690 Otitis media, unspecified, unspecified ear: Secondary | ICD-10-CM | POA: Insufficient documentation

## 2023-07-11 MED ORDER — AMOXICILLIN-POT CLAVULANATE 500-125 MG PO TABS
1.0000 | ORAL_TABLET | Freq: Two times a day (BID) | ORAL | 0 refills | Status: DC
Start: 2023-07-11 — End: 2023-08-07

## 2023-07-11 MED ORDER — HYDROMORPHONE HCL 2 MG PO TABS
2.0000 mg | ORAL_TABLET | Freq: Two times a day (BID) | ORAL | 0 refills | Status: DC | PRN
Start: 2023-07-11 — End: 2023-07-13

## 2023-07-11 NOTE — Assessment & Plan Note (Signed)
She states that her left ear started hurting about 2 weeks ago. Pain is intermittent and feels like an aching feeling. Initial exam limited due to cerumen impaction, she did not have pain with movement of pinna. RN was able to flush ear and remove large amount of wax. There is an effusion present on exam consistent with otitis media. P: Treat with augmentin 500-125 mg BID for 7 days (renally dosed) F/u in 2 weeks

## 2023-07-11 NOTE — Patient Instructions (Signed)

## 2023-07-11 NOTE — Telephone Encounter (Signed)
Called patient left voice message regarding her missed appointment for mammogram that was scheduled 05-12-2023 @ 9:40 am. Patient can contact their office at [phone] to schedule appointment.
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
## 2023-07-11 NOTE — Assessment & Plan Note (Signed)
Patient with history of HFrEF. Last EF 30-35% in 2022.   [facility] in [place], [place] Address: [address], [place], [place] [address] Phone: [phone]
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
## 2023-07-11 NOTE — Assessment & Plan Note (Addendum)
It appears that medication was changed from dilaudid to oxycodone after hospital discharge a few months ago. I do think she has indication for pain medication with history of AVN of right hip and she is a high risk surgical candidate with ESRD and HFrEF. I reviewed concern with patient about oxycodone in setting of ESRD and she was amenable with switching to dilaudid 2 mg q 12 hrs PRN. PDMP reviewed and she was last prescribed #120 of oxycodone at end of August. She has bottle with her today and it is empty. She reports last dose was a few days ago. P: -dilaudid 2 mg q 12 hrs prn, #60 tabs sent -follow-up in 2 weeks for pain contract -with history of encephalopathy will try to minimize amount. I talked with her that we could not take away pain completely but goal was to improve enough to help her with function. -she was referred to palliative care in May of this year. However patient was not aware of referral. I think for now it is appropriate to have her follow-up in 2 weeks to review pain contract together and start goals of care conversation. Her daughter is on fall break in a few weeks and may be able to come to appointment with her. My questions at that appointment will be to get an understanding of how much insight patient and her family have into prognosis with ESRD and HFrEF. From our encounter today, it sounds like pill burden is bothersome to her. Outside of PPI, I do not see medications that could be stopped. Nephrology is helping with titrating blood pressure medications. -DME for rollator as she has been borrowing a friends, a cane or walker will not suffice as patient may become short of breath with ambulation in setting of heart failure. -on chart review, she did see palliative in the hospital in 2022 prior to starting HD and goals of care changed to partial code. MOST forms on file
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
## 2023-07-11 NOTE — Patient Instructions (Addendum)
Thank you, [name] for allowing [place] to provide your care today. Today we discussed:    Right hip pain- I will send in refill of pain medication.  I am sending in dilaudid 2 mg, you can take this up to 2 times a day. We should do a pain contract together at next appointment. Your prior imaging shows bone decay. This would normally be something that could be operated on but you are high-risk for surgery due to dialysis and heart failure/ Left ear pain- it looks like you have an ear infection. I am sending in 7 days of Augmentin that you will take 2 times daily. I placed another order for a sleep study and rollator   Referrals ordered today:   Referral Orders         Ambulatory Referral for DME         Ambulatory referral to Sleep Studies       I have ordered the following medication/changed the following medications:   Stop the following medications: Medications Discontinued During This Encounter  Medication Reason   hydrALAZINE (APRESOLINE) 25 MG tablet    losartan (COZAAR) 100 MG tablet    iron sucrose 100 mg in sodium chloride 0.9 % 100 mL      Start the following medications: Meds ordered this encounter  Medications   HYDROmorphone (DILAUDID) 2 MG tablet    Sig: Take 1 tablet (2 mg total) by mouth every 12 (twelve) hours as needed for severe pain.    Dispense:  60 tablet    Refill:  0   amoxicillin-clavulanate (AUGMENTIN) 500-125 MG tablet    Sig: Take 1 tablet by mouth 2 (two) times daily.    Dispense:  14 tablet    Refill:  0    We look forward to seeing you next time. Please call our clinic at [phone] if you have any questions or concerns. The best time to call is Monday-Friday from 9am-4pm, but there is someone available 24/7. If after hours or the weekend, call the main hospital number and ask for the Internal Medicine Resident On-Call. If you need medication refills, please notify your pharmacy one week in advance and they will send [place] a request.    Thank you for trusting me with your care. Wishing you the best!   Rudene Christians, DO [facility]

## 2023-07-13 ENCOUNTER — Other Ambulatory Visit: Payer: Self-pay | Admitting: *Deleted

## 2023-07-13 DIAGNOSIS — M25551 Pain in right hip: Secondary | ICD-10-CM

## 2023-07-13 MED ORDER — HYDROMORPHONE HCL 2 MG PO TABS: 2.0000 mg | ORAL_TABLET | Freq: Two times a day (BID) | ORAL | 0 refills | Status: DC | PRN

## 2023-07-13 NOTE — Telephone Encounter (Signed)
Return pt's call - stated she was unable to pick-up the pain med and abx from the pharmacy. I called [facility] - pharmacist stated their system will be accept Dr Sloan Leiter' hospital DEA#.  Sending request to The Attending. And the abx is ready for pick-up.

## 2023-07-13 NOTE — Addendum Note (Signed)
Addended by: Derrek Monaco on: 07/13/2023 01:10 PM   Modules accepted: Level of Service

## 2023-07-13 NOTE — Progress Notes (Signed)
Internal Medicine Clinic Attending  Case discussed with the resident at the time of the visit.  We reviewed the resident's history and exam and pertinent patient test results.  I agree with the assessment, diagnosis, and plan of care documented in the resident's note.  

## 2023-07-13 NOTE — Telephone Encounter (Signed)
Pt was called / informed.

## 2023-07-18 ENCOUNTER — Telehealth: Payer: Self-pay | Admitting: Internal Medicine

## 2023-07-18 NOTE — Telephone Encounter (Signed)
Rec'd message and a form in regards to the Rollator DME Referral that was sent.  Per [employer] the OV notes submitted for 07/11/2023 for the Rollator walker needs,  "a narrative that a cane, walker will not suffice but a rollator due to patient condition/conditions."  Will this pt require another appt vs an updated note with this information.

## 2023-07-26 NOTE — Progress Notes (Signed)
Internal Medicine Clinic Attending  Case and documentation of Dr. Sloan Leiter  soon after the resident saw the patient reviewed.  I reviewed the AWV findings.  I agree with the assessment, diagnosis, and plan of care documented in the AWV note.    Debe Coder, MD

## 2023-07-27 ENCOUNTER — Encounter: Payer: Medicare HMO | Admitting: Internal Medicine

## 2023-08-01 ENCOUNTER — Telehealth: Payer: Self-pay

## 2023-08-01 NOTE — Transitions of Care (Post Inpatient/ED Visit) (Signed)
08/01/2023  Name: Audrey Hill MRN: [id number] DOB: 1956-05-23  Today's TOC FU Call Status: Today's TOC FU Call Status:: Unsuccessful Call (1st Attempt) Unsuccessful Call (1st Attempt) Date: 08/01/23  Attempted to reach the patient regarding the most recent Inpatient/ED visit.  Follow Up Plan: Additional outreach attempts will be made to reach the patient to complete the Transitions of Care (Post Inpatient/ED visit) call.   Jodelle Gross RN, BSN, CCM RN Care Manager  Transitions of Care  [facility]  [phone]

## 2023-08-04 ENCOUNTER — Emergency Department (HOSPITAL_COMMUNITY): Payer: Medicare HMO

## 2023-08-04 ENCOUNTER — Other Ambulatory Visit: Payer: Self-pay

## 2023-08-04 ENCOUNTER — Encounter (HOSPITAL_COMMUNITY): Payer: Self-pay

## 2023-08-04 ENCOUNTER — Inpatient Hospital Stay (HOSPITAL_COMMUNITY)
Admission: EM | Admit: 2023-08-04 | Discharge: 2023-08-07 | DRG: 091 | Disposition: A | Discharge status: Home Health | Payer: Medicare HMO | Attending: Internal Medicine | Admitting: Internal Medicine

## 2023-08-04 DIAGNOSIS — G894 Chronic pain syndrome: Secondary | ICD-10-CM | POA: Diagnosis present

## 2023-08-04 DIAGNOSIS — R9431 Abnormal electrocardiogram [ECG] [EKG]: Secondary | ICD-10-CM

## 2023-08-04 DIAGNOSIS — K2100 Gastro-esophageal reflux disease with esophagitis, without bleeding: Secondary | ICD-10-CM

## 2023-08-04 DIAGNOSIS — I480 Paroxysmal atrial fibrillation: Secondary | ICD-10-CM | POA: Diagnosis present

## 2023-08-04 DIAGNOSIS — K219 Gastro-esophageal reflux disease without esophagitis: Secondary | ICD-10-CM | POA: Diagnosis present

## 2023-08-04 DIAGNOSIS — D649 Anemia, unspecified: Secondary | ICD-10-CM | POA: Insufficient documentation

## 2023-08-04 DIAGNOSIS — J4489 Other specified chronic obstructive pulmonary disease: Secondary | ICD-10-CM | POA: Diagnosis present

## 2023-08-04 DIAGNOSIS — E873 Alkalosis: Secondary | ICD-10-CM | POA: Diagnosis not present

## 2023-08-04 DIAGNOSIS — K31819 Angiodysplasia of stomach and duodenum without bleeding: Secondary | ICD-10-CM

## 2023-08-04 DIAGNOSIS — Z8673 Personal history of transient ischemic attack (TIA), and cerebral infarction without residual deficits: Secondary | ICD-10-CM

## 2023-08-04 DIAGNOSIS — I5022 Chronic systolic (congestive) heart failure: Secondary | ICD-10-CM | POA: Diagnosis present

## 2023-08-04 DIAGNOSIS — G928 Other toxic encephalopathy: Secondary | ICD-10-CM | POA: Diagnosis not present

## 2023-08-04 DIAGNOSIS — Z96652 Presence of left artificial knee joint: Secondary | ICD-10-CM | POA: Diagnosis present

## 2023-08-04 DIAGNOSIS — G4700 Insomnia, unspecified: Secondary | ICD-10-CM | POA: Diagnosis present

## 2023-08-04 DIAGNOSIS — Z803 Family history of malignant neoplasm of breast: Secondary | ICD-10-CM

## 2023-08-04 DIAGNOSIS — I10 Essential (primary) hypertension: Secondary | ICD-10-CM | POA: Diagnosis present

## 2023-08-04 DIAGNOSIS — K31811 Angiodysplasia of stomach and duodenum with bleeding: Secondary | ICD-10-CM | POA: Diagnosis not present

## 2023-08-04 DIAGNOSIS — Z7901 Long term (current) use of anticoagulants: Secondary | ICD-10-CM

## 2023-08-04 DIAGNOSIS — Z833 Family history of diabetes mellitus: Secondary | ICD-10-CM

## 2023-08-04 DIAGNOSIS — Z992 Dependence on renal dialysis: Secondary | ICD-10-CM

## 2023-08-04 DIAGNOSIS — G9341 Metabolic encephalopathy: Principal | ICD-10-CM

## 2023-08-04 DIAGNOSIS — M25551 Pain in right hip: Secondary | ICD-10-CM

## 2023-08-04 DIAGNOSIS — Z885 Allergy status to narcotic agent status: Secondary | ICD-10-CM

## 2023-08-04 DIAGNOSIS — Z886 Allergy status to analgesic agent status: Secondary | ICD-10-CM

## 2023-08-04 DIAGNOSIS — Z888 Allergy status to other drugs, medicaments and biological substances status: Secondary | ICD-10-CM

## 2023-08-04 DIAGNOSIS — Z1152 Encounter for screening for COVID-19: Secondary | ICD-10-CM

## 2023-08-04 DIAGNOSIS — F32A Depression, unspecified: Secondary | ICD-10-CM | POA: Diagnosis present

## 2023-08-04 DIAGNOSIS — I132 Hypertensive heart and chronic kidney disease with heart failure and with stage 5 chronic kidney disease, or end stage renal disease: Secondary | ICD-10-CM | POA: Diagnosis present

## 2023-08-04 DIAGNOSIS — N186 End stage renal disease: Secondary | ICD-10-CM

## 2023-08-04 DIAGNOSIS — Z8249 Family history of ischemic heart disease and other diseases of the circulatory system: Secondary | ICD-10-CM

## 2023-08-04 DIAGNOSIS — M898X9 Other specified disorders of bone, unspecified site: Secondary | ICD-10-CM | POA: Diagnosis present

## 2023-08-04 DIAGNOSIS — G9340 Encephalopathy, unspecified: Secondary | ICD-10-CM | POA: Diagnosis present

## 2023-08-04 DIAGNOSIS — G43909 Migraine, unspecified, not intractable, without status migrainosus: Secondary | ICD-10-CM | POA: Diagnosis present

## 2023-08-04 DIAGNOSIS — Y92009 Unspecified place in unspecified non-institutional (private) residence as the place of occurrence of the external cause: Secondary | ICD-10-CM

## 2023-08-04 DIAGNOSIS — M549 Dorsalgia, unspecified: Secondary | ICD-10-CM | POA: Diagnosis present

## 2023-08-04 DIAGNOSIS — E785 Hyperlipidemia, unspecified: Secondary | ICD-10-CM | POA: Diagnosis present

## 2023-08-04 DIAGNOSIS — T402X5A Adverse effect of other opioids, initial encounter: Secondary | ICD-10-CM | POA: Diagnosis present

## 2023-08-04 DIAGNOSIS — E162 Hypoglycemia, unspecified: Secondary | ICD-10-CM

## 2023-08-04 DIAGNOSIS — Z79899 Other long term (current) drug therapy: Secondary | ICD-10-CM

## 2023-08-04 DIAGNOSIS — D631 Anemia in chronic kidney disease: Secondary | ICD-10-CM | POA: Diagnosis present

## 2023-08-04 DIAGNOSIS — Z83438 Family history of other disorder of lipoprotein metabolism and other lipidemia: Secondary | ICD-10-CM

## 2023-08-04 LAB — URINALYSIS, ROUTINE W REFLEX MICROSCOPIC
Bilirubin Urine: NEGATIVE
Glucose, UA: NEGATIVE mg/dL
Hgb urine dipstick: NEGATIVE
Ketones, ur: NEGATIVE mg/dL
Leukocytes,Ua: NEGATIVE
Nitrite: NEGATIVE
Protein, ur: 30 mg/dL — AB
Specific Gravity, Urine: 1.005 (ref 1.005–1.030)
pH: 8 (ref 5.0–8.0)

## 2023-08-04 LAB — CBC WITH DIFFERENTIAL/PLATELET
Abs Immature Granulocytes: 0.01 10*3/uL (ref 0.00–0.07)
Basophils Absolute: 0 10*3/uL (ref 0.0–0.1)
Basophils Relative: 1 %
Eosinophils Absolute: 0.1 10*3/uL (ref 0.0–0.5)
Eosinophils Relative: 2 %
HCT: 26.7 % — ABNORMAL LOW (ref 36.0–46.0)
Hemoglobin: 8.6 g/dL — ABNORMAL LOW (ref 12.0–15.0)
Immature Granulocytes: 0 %
Lymphocytes Relative: 13 %
Lymphs Abs: 0.4 10*3/uL — ABNORMAL LOW (ref 0.7–4.0)
MCH: 26.4 pg (ref 26.0–34.0)
MCHC: 32.2 g/dL (ref 30.0–36.0)
MCV: 81.9 fL (ref 80.0–100.0)
Monocytes Absolute: 0.6 10*3/uL (ref 0.1–1.0)
Monocytes Relative: 18 %
Neutro Abs: 2.2 10*3/uL (ref 1.7–7.7)
Neutrophils Relative %: 66 %
Platelets: 197 10*3/uL (ref 150–400)
RBC: 3.26 MIL/uL — ABNORMAL LOW (ref 3.87–5.11)
RDW: 19.8 % — ABNORMAL HIGH (ref 11.5–15.5)
WBC: 3.3 10*3/uL — ABNORMAL LOW (ref 4.0–10.5)
nRBC: 0 % (ref 0.0–0.2)

## 2023-08-04 LAB — I-STAT VENOUS BLOOD GAS, ED
Acid-Base Excess: 7 mmol/L — ABNORMAL HIGH (ref 0.0–2.0)
Bicarbonate: 30.5 mmol/L — ABNORMAL HIGH (ref 20.0–28.0)
Calcium, Ion: 0.93 mmol/L — ABNORMAL LOW (ref 1.15–1.40)
HCT: 29 % — ABNORMAL LOW (ref 36.0–46.0)
Hemoglobin: 9.9 g/dL — ABNORMAL LOW (ref 12.0–15.0)
O2 Saturation: 85 %
Potassium: 3.9 mmol/L (ref 3.5–5.1)
Sodium: 137 mmol/L (ref 135–145)
TCO2: 32 mmol/L (ref 22–32)
pCO2, Ven: 37 mm[Hg] — ABNORMAL LOW (ref 44–60)
pH, Ven: 7.525 — ABNORMAL HIGH (ref 7.25–7.43)
pO2, Ven: 45 mm[Hg] (ref 32–45)

## 2023-08-04 LAB — COMPREHENSIVE METABOLIC PANEL
ALT: 42 U/L (ref 0–44)
AST: 43 U/L — ABNORMAL HIGH (ref 15–41)
Albumin: 3 g/dL — ABNORMAL LOW (ref 3.5–5.0)
Alkaline Phosphatase: 214 U/L — ABNORMAL HIGH (ref 38–126)
Anion gap: 13 (ref 5–15)
BUN: 43 mg/dL — ABNORMAL HIGH (ref 8–23)
CO2: 28 mmol/L (ref 22–32)
Calcium: 8.2 mg/dL — ABNORMAL LOW (ref 8.9–10.3)
Chloride: 97 mmol/L — ABNORMAL LOW (ref 98–111)
Creatinine, Ser: 5.42 mg/dL — ABNORMAL HIGH (ref 0.44–1.00)
GFR, Estimated: 8 mL/min — ABNORMAL LOW (ref >60)
Glucose, Bld: 75 mg/dL (ref 70–99)
Potassium: 4 mmol/L (ref 3.5–5.1)
Sodium: 138 mmol/L (ref 135–145)
Total Bilirubin: 0.4 mg/dL (ref 0.3–1.2)
Total Protein: 7.5 g/dL (ref 6.5–8.1)

## 2023-08-04 LAB — CBG MONITORING, ED
Glucose-Capillary: 74 mg/dL (ref 70–99)
Glucose-Capillary: 61 mg/dL — ABNORMAL LOW (ref 70–99)
Glucose-Capillary: 95 mg/dL (ref 70–99)
Glucose-Capillary: 84 mg/dL (ref 70–99)
Glucose-Capillary: 71 mg/dL (ref 70–99)

## 2023-08-04 LAB — MAGNESIUM: Magnesium: 2 mg/dL (ref 1.7–2.4)

## 2023-08-04 LAB — AMMONIA: Ammonia: 40 umol/L — ABNORMAL HIGH (ref 9–35)

## 2023-08-04 LAB — I-STAT CG4 LACTIC ACID, ED: Lactic Acid, Venous: 0.8 mmol/L (ref 0.5–1.9)

## 2023-08-04 LAB — SALICYLATE LEVEL: Salicylate Lvl: <7 mg/dL — ABNORMAL LOW (ref 7.0–30.0)

## 2023-08-04 LAB — CHLORIDE, URINE, RANDOM: Chloride Urine: 19 mmol/L

## 2023-08-04 MED ORDER — LIDOCAINE 5 % EX PTCH: 1.0000 | MEDICATED_PATCH | Freq: Every day | CUTANEOUS | Status: DC | PRN

## 2023-08-04 MED ORDER — ISOSORBIDE DINITRATE 20 MG PO TABS
20.0000 mg | ORAL_TABLET | Freq: Three times a day (TID) | ORAL | Status: DC
  Administered 2023-08-04 – 2023-08-06 (×7): 20 mg via ORAL
  Filled 2023-08-04 (×8): qty 1

## 2023-08-04 MED ORDER — MELATONIN 3 MG PO TABS: 3.0000 mg | ORAL_TABLET | Freq: Every evening | ORAL | Status: DC | PRN

## 2023-08-04 MED ORDER — APIXABAN 5 MG PO TABS
5.0000 mg | ORAL_TABLET | Freq: Two times a day (BID) | ORAL | Status: DC
  Administered 2023-08-04: 5 mg via ORAL
  Filled 2023-08-04: qty 1

## 2023-08-04 MED ORDER — CARVEDILOL 3.125 MG PO TABS
3.1250 mg | ORAL_TABLET | Freq: Two times a day (BID) | ORAL | Status: DC
  Administered 2023-08-05 – 2023-08-06 (×3): 3.125 mg via ORAL
  Filled 2023-08-04 (×3): qty 1

## 2023-08-04 MED ORDER — ATORVASTATIN CALCIUM 10 MG PO TABS
20.0000 mg | ORAL_TABLET | Freq: Every day | ORAL | Status: DC
  Administered 2023-08-05 – 2023-08-07 (×3): 20 mg via ORAL
  Filled 2023-08-04 (×3): qty 2

## 2023-08-04 MED ORDER — HYDROMORPHONE HCL 2 MG PO TABS
2.0000 mg | ORAL_TABLET | Freq: Two times a day (BID) | ORAL | Status: DC | PRN
  Administered 2023-08-05 – 2023-08-06 (×4): 2 mg via ORAL
  Filled 2023-08-04 (×4): qty 1

## 2023-08-04 MED ORDER — ALBUTEROL SULFATE (2.5 MG/3ML) 0.083% IN NEBU: 2.5000 mg | INHALATION_SOLUTION | Freq: Four times a day (QID) | RESPIRATORY_TRACT | Status: DC | PRN

## 2023-08-04 MED ORDER — TRAZODONE HCL 50 MG PO TABS
50.0000 mg | ORAL_TABLET | Freq: Every day | ORAL | Status: DC
  Administered 2023-08-04 – 2023-08-06 (×3): 50 mg via ORAL
  Filled 2023-08-04 (×3): qty 1

## 2023-08-04 MED ORDER — LOSARTAN POTASSIUM 50 MG PO TABS
50.0000 mg | ORAL_TABLET | Freq: Every day | ORAL | Status: DC
  Administered 2023-08-05 – 2023-08-07 (×3): 50 mg via ORAL
  Filled 2023-08-04 (×3): qty 1

## 2023-08-04 MED ORDER — SERTRALINE HCL 50 MG PO TABS
25.0000 mg | ORAL_TABLET | Freq: Every day | ORAL | Status: DC
  Administered 2023-08-05 – 2023-08-07 (×3): 25 mg via ORAL
  Filled 2023-08-04 (×3): qty 1

## 2023-08-04 MED ORDER — DEXTROSE 50 % IV SOLN
25.0000 mL | Freq: Once | INTRAVENOUS | Status: AC
  Administered 2023-08-04: 25 mL via INTRAVENOUS

## 2023-08-04 MED ORDER — DEXTROSE 50 % IV SOLN
INTRAVENOUS | Status: AC
  Filled 2023-08-04: qty 50

## 2023-08-04 MED ORDER — PANTOPRAZOLE SODIUM 40 MG PO TBEC: 40.0000 mg | DELAYED_RELEASE_TABLET | Freq: Every day | ORAL | Status: DC

## 2023-08-04 MED ORDER — HYDRALAZINE HCL 25 MG PO TABS
25.0000 mg | ORAL_TABLET | Freq: Once | ORAL | Status: AC
  Administered 2023-08-04: 25 mg via ORAL
  Filled 2023-08-04: qty 1

## 2023-08-04 MED ORDER — AMIODARONE HCL 200 MG PO TABS
200.0000 mg | ORAL_TABLET | Freq: Every day | ORAL | Status: DC
  Administered 2023-08-05 – 2023-08-07 (×3): 200 mg via ORAL
  Filled 2023-08-04 (×3): qty 1
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
## 2023-08-04 NOTE — ED Notes (Signed)
Attempted IV placement twice with no success. EMS attempted x 1 with no success. Will notify provider.

## 2023-08-04 NOTE — Hospital Course (Addendum)
Was at dialysis and was lethargic, confused Started feeling bad at dialysis Felt SOB at dialysis [facility] she was feeling normal  No sick contacts No fevers, n/v, cough, chest pain Has headache that started today - does not usually get ha Appetite good Makes little urine - no dysuria, hematuria  No speech changes Intermittently falls asleep on interview Fell yesterday   Drifts off while conversing with daughter since last hospitalization   Never had sleep apnea test Was normal before HD   - GI team consulted, .   PMHx ESRD on HD TTS HFrEF Afib  COPD Prior stroke  Meds: - losartan 50 - albuterol - amiodarone 200 mg  - eliquis 5 bid  - lipitor 20  - coreg 3.125 mg  - lasix 80  - gaba 100 TID  - dilaudid 2 mg BID prn  - isordil 20 TID  - Melatonin 3 at bedtime - protonix 40  - zoloft 25 - trazodone 50 at bedtime   Social Hx: - grandson stays with her - independent of ADLs, daughter helps with iADLs - walks with walker and cane  - no tobacco, alcohol, other substance use   Sleep study

## 2023-08-04 NOTE — ED Triage Notes (Signed)
[facility] reports pt coming from dialysis. Finished her full treatment today and staff states pt was c/o sob and being lethargic. This is not her baseline per staff.

## 2023-08-05 DIAGNOSIS — D62 Acute posthemorrhagic anemia: Secondary | ICD-10-CM

## 2023-08-05 DIAGNOSIS — T402X5A Adverse effect of other opioids, initial encounter: Secondary | ICD-10-CM | POA: Diagnosis present

## 2023-08-05 DIAGNOSIS — Z79899 Other long term (current) drug therapy: Secondary | ICD-10-CM | POA: Diagnosis not present

## 2023-08-05 DIAGNOSIS — G9340 Encephalopathy, unspecified: Secondary | ICD-10-CM | POA: Diagnosis not present

## 2023-08-05 DIAGNOSIS — K31819 Angiodysplasia of stomach and duodenum without bleeding: Secondary | ICD-10-CM

## 2023-08-05 DIAGNOSIS — J188 Other pneumonia, unspecified organism: Secondary | ICD-10-CM | POA: Diagnosis not present

## 2023-08-05 DIAGNOSIS — Z7901 Long term (current) use of anticoagulants: Secondary | ICD-10-CM | POA: Diagnosis not present

## 2023-08-05 DIAGNOSIS — F32A Depression, unspecified: Secondary | ICD-10-CM | POA: Diagnosis present

## 2023-08-05 DIAGNOSIS — Z888 Allergy status to other drugs, medicaments and biological substances status: Secondary | ICD-10-CM | POA: Diagnosis not present

## 2023-08-05 DIAGNOSIS — G43909 Migraine, unspecified, not intractable, without status migrainosus: Secondary | ICD-10-CM | POA: Diagnosis present

## 2023-08-05 DIAGNOSIS — Z96652 Presence of left artificial knee joint: Secondary | ICD-10-CM | POA: Diagnosis present

## 2023-08-05 DIAGNOSIS — Z886 Allergy status to analgesic agent status: Secondary | ICD-10-CM | POA: Diagnosis not present

## 2023-08-05 DIAGNOSIS — G928 Other toxic encephalopathy: Secondary | ICD-10-CM | POA: Diagnosis present

## 2023-08-05 DIAGNOSIS — I480 Paroxysmal atrial fibrillation: Secondary | ICD-10-CM | POA: Diagnosis present

## 2023-08-05 DIAGNOSIS — Y92009 Unspecified place in unspecified non-institutional (private) residence as the place of occurrence of the external cause: Secondary | ICD-10-CM | POA: Diagnosis not present

## 2023-08-05 DIAGNOSIS — J4489 Other specified chronic obstructive pulmonary disease: Secondary | ICD-10-CM | POA: Diagnosis present

## 2023-08-05 DIAGNOSIS — E785 Hyperlipidemia, unspecified: Secondary | ICD-10-CM | POA: Diagnosis present

## 2023-08-05 DIAGNOSIS — G894 Chronic pain syndrome: Secondary | ICD-10-CM | POA: Diagnosis present

## 2023-08-05 DIAGNOSIS — Z885 Allergy status to narcotic agent status: Secondary | ICD-10-CM | POA: Diagnosis not present

## 2023-08-05 DIAGNOSIS — I5022 Chronic systolic (congestive) heart failure: Secondary | ICD-10-CM | POA: Diagnosis present

## 2023-08-05 DIAGNOSIS — E873 Alkalosis: Secondary | ICD-10-CM | POA: Diagnosis present

## 2023-08-05 DIAGNOSIS — G4700 Insomnia, unspecified: Secondary | ICD-10-CM | POA: Diagnosis present

## 2023-08-05 DIAGNOSIS — D631 Anemia in chronic kidney disease: Secondary | ICD-10-CM | POA: Diagnosis present

## 2023-08-05 DIAGNOSIS — Z992 Dependence on renal dialysis: Secondary | ICD-10-CM | POA: Diagnosis not present

## 2023-08-05 DIAGNOSIS — Z8673 Personal history of transient ischemic attack (TIA), and cerebral infarction without residual deficits: Secondary | ICD-10-CM | POA: Diagnosis not present

## 2023-08-05 DIAGNOSIS — Z1152 Encounter for screening for COVID-19: Secondary | ICD-10-CM | POA: Diagnosis not present

## 2023-08-05 DIAGNOSIS — N186 End stage renal disease: Secondary | ICD-10-CM | POA: Diagnosis present

## 2023-08-05 DIAGNOSIS — K31811 Angiodysplasia of stomach and duodenum with bleeding: Secondary | ICD-10-CM | POA: Diagnosis not present

## 2023-08-05 DIAGNOSIS — D649 Anemia, unspecified: Secondary | ICD-10-CM | POA: Insufficient documentation

## 2023-08-05 DIAGNOSIS — I132 Hypertensive heart and chronic kidney disease with heart failure and with stage 5 chronic kidney disease, or end stage renal disease: Secondary | ICD-10-CM | POA: Diagnosis present

## 2023-08-05 LAB — IRON AND TIBC
Iron: 20 ug/dL — ABNORMAL LOW (ref 28–170)
Saturation Ratios: 10 % — ABNORMAL LOW (ref 10.4–31.8)
TIBC: 207 ug/dL — ABNORMAL LOW (ref 250–450)
UIBC: 187 ug/dL

## 2023-08-05 LAB — CBC
HCT: 23 % — ABNORMAL LOW (ref 36.0–46.0)
HCT: 24.1 % — ABNORMAL LOW (ref 36.0–46.0)
Hemoglobin: 7.3 g/dL — ABNORMAL LOW (ref 12.0–15.0)
Hemoglobin: 7.8 g/dL — ABNORMAL LOW (ref 12.0–15.0)
MCH: 25.5 pg — ABNORMAL LOW (ref 26.0–34.0)
MCH: 26.4 pg (ref 26.0–34.0)
MCHC: 31.7 g/dL (ref 30.0–36.0)
MCHC: 32.4 g/dL (ref 30.0–36.0)
MCV: 80.4 fL (ref 80.0–100.0)
MCV: 81.4 fL (ref 80.0–100.0)
Platelets: 174 10*3/uL (ref 150–400)
Platelets: 190 10*3/uL (ref 150–400)
RBC: 2.86 MIL/uL — ABNORMAL LOW (ref 3.87–5.11)
RBC: 2.96 MIL/uL — ABNORMAL LOW (ref 3.87–5.11)
RDW: 19.2 % — ABNORMAL HIGH (ref 11.5–15.5)
RDW: 19.4 % — ABNORMAL HIGH (ref 11.5–15.5)
WBC: 3.1 10*3/uL — ABNORMAL LOW (ref 4.0–10.5)
WBC: 3.3 10*3/uL — ABNORMAL LOW (ref 4.0–10.5)
nRBC: 0 % (ref 0.0–0.2)
nRBC: 0 % (ref 0.0–0.2)

## 2023-08-05 LAB — BASIC METABOLIC PANEL
Anion gap: 8 (ref 5–15)
BUN: 43 mg/dL — ABNORMAL HIGH (ref 8–23)
CO2: 30 mmol/L (ref 22–32)
Calcium: 8 mg/dL — ABNORMAL LOW (ref 8.9–10.3)
Chloride: 98 mmol/L (ref 98–111)
Creatinine, Ser: 6.25 mg/dL — ABNORMAL HIGH (ref 0.44–1.00)
GFR, Estimated: 7 mL/min — ABNORMAL LOW (ref >60)
Glucose, Bld: 95 mg/dL (ref 70–99)
Potassium: 4.4 mmol/L (ref 3.5–5.1)
Sodium: 136 mmol/L (ref 135–145)

## 2023-08-05 LAB — RESP PANEL BY RT-PCR (RSV, FLU A&B, COVID)  RVPGX2
Influenza A by PCR: NEGATIVE
Influenza B by PCR: NEGATIVE
Resp Syncytial Virus by PCR: NEGATIVE
SARS Coronavirus 2 by RT PCR: NEGATIVE

## 2023-08-05 LAB — HIV ANTIBODY (ROUTINE TESTING W REFLEX): HIV Screen 4th Generation wRfx: NONREACTIVE

## 2023-08-05 LAB — HEPATITIS B SURFACE ANTIGEN: Hepatitis B Surface Ag: NONREACTIVE

## 2023-08-05 LAB — FERRITIN: Ferritin: 278 ng/mL (ref 11–307)

## 2023-08-05 MED ORDER — SODIUM CHLORIDE 0.9 % IV SOLN
1.0000 g | Freq: Every day | INTRAVENOUS | Status: DC
  Administered 2023-08-05 – 2023-08-06 (×3): 1 g via INTRAVENOUS
  Filled 2023-08-05 (×3): qty 10

## 2023-08-05 MED ORDER — LIDOCAINE HCL (PF) 1 % IJ SOLN: 5.0000 mL | INTRAMUSCULAR | Status: DC | PRN

## 2023-08-05 MED ORDER — ALTEPLASE 2 MG IJ SOLR: 2.0000 mg | Freq: Once | INTRAMUSCULAR | Status: DC | PRN

## 2023-08-05 MED ORDER — PANTOPRAZOLE SODIUM 40 MG IV SOLR
40.0000 mg | Freq: Two times a day (BID) | INTRAVENOUS | Status: DC
  Administered 2023-08-05 – 2023-08-07 (×5): 40 mg via INTRAVENOUS
  Filled 2023-08-05 (×5): qty 10

## 2023-08-05 MED ORDER — SODIUM CHLORIDE 0.9 % IV BOLUS
500.0000 mL | Freq: Once | INTRAVENOUS | Status: AC
  Administered 2023-08-05: 500 mL via INTRAVENOUS

## 2023-08-05 MED ORDER — DOXYCYCLINE HYCLATE 100 MG PO TABS
100.0000 mg | ORAL_TABLET | Freq: Two times a day (BID) | ORAL | Status: DC
  Administered 2023-08-05 – 2023-08-07 (×6): 100 mg via ORAL
  Filled 2023-08-05 (×6): qty 1

## 2023-08-05 MED ORDER — HEPARIN SODIUM (PORCINE) 1000 UNIT/ML DIALYSIS: 1000.0000 [IU] | INTRAMUSCULAR | Status: DC | PRN

## 2023-08-05 MED ORDER — ANTICOAGULANT SODIUM CITRATE 4% (200MG/5ML) IV SOLN
5.0000 mL | Status: DC | PRN
Start: 2023-08-05 — End: 2023-08-06

## 2023-08-05 MED ORDER — DICLOFENAC SODIUM 1 % EX GEL
2.0000 g | Freq: Two times a day (BID) | CUTANEOUS | Status: DC
  Administered 2023-08-05 – 2023-08-07 (×5): 2 g via TOPICAL
  Filled 2023-08-05: qty 100

## 2023-08-05 MED ORDER — LIDOCAINE-PRILOCAINE 2.5-2.5 % EX CREA
1.0000 | TOPICAL_CREAM | CUTANEOUS | Status: DC | PRN
Start: 2023-08-05 — End: 2023-08-06

## 2023-08-05 MED ORDER — NEPRO/CARBSTEADY PO LIQD: 237.0000 mL | ORAL | Status: DC | PRN

## 2023-08-05 MED ORDER — PENTAFLUOROPROP-TETRAFLUOROETH EX AERO
1.0000 | INHALATION_SPRAY | CUTANEOUS | Status: DC | PRN
Start: 2023-08-05 — End: 2023-08-06

## 2023-08-05 MED ORDER — LIDOCAINE 5 % EX PTCH
1.0000 | MEDICATED_PATCH | CUTANEOUS | Status: DC
Start: 2023-08-05 — End: 2023-08-07
  Administered 2023-08-05 – 2023-08-06 (×2): 1 via TRANSDERMAL
  Filled 2023-08-05 (×2): qty 1

## 2023-08-05 MED ORDER — CHLORHEXIDINE GLUCONATE CLOTH 2 % EX PADS
6.0000 | MEDICATED_PAD | Freq: Every day | CUTANEOUS | Status: DC
  Administered 2023-08-06: 6 via TOPICAL

## 2023-08-05 NOTE — TOC Initial Note (Signed)
Transition of Care [facility]) - Initial/Assessment Note    Patient Details  Name: Audrey Hill MRN: [id number] Date of Birth: 03/06/1956  [facility] CM/SW Contact:    Erin Sons, LCSW Phone Number: 08/05/2023, 1:23 PM  Clinical Narrative:                  CSW met with pt to discuss SNF rec. Pt lives at home with her son in [place]. She gets OPHD at [facility] 1130am chairtime. Pt is agreeable to SNF w/u. Fl2 completed and bed requests sent in hub. PASRR is pending  Expected Discharge Plan: Skilled Nursing Facility Barriers to Discharge: Continued Medical Work up   Patient Goals and CMS Choice            Expected Discharge Plan and Services       Living arrangements for the past 2 months: Apartment                                      Prior Living Arrangements/Services Living arrangements for the past 2 months: Apartment Lives with:: Adult Children Patient language and need for interpreter reviewed:: Yes        Need for Family Participation in Patient Care: Yes (Comment) Care giver support system in place?: Yes (comment)   Criminal Activity/Legal Involvement Pertinent to Current Situation/Hospitalization: No - Comment as needed  Activities of Daily Living      Permission Sought/Granted                  Emotional Assessment Appearance:: Appears stated age Attitude/Demeanor/Rapport: Engaged Affect (typically observed): Accepting Orientation: : Oriented to Self, Oriented to Place, Oriented to  Time, Oriented to Situation Alcohol / Substance Use: Not Applicable Psych Involvement: No (comment)  Admission diagnosis:  Metabolic alkalosis [E87.3] Hypoglycemia [E16.2] Prolonged QT interval [R94.31] Acute encephalopathy [G93.40] Acute metabolic encephalopathy [G93.41] Patient Active Problem List   Diagnosis Date Noted   Acute on chronic anemia 08/05/2023   Acute encephalopathy 08/04/2023   Acute otitis media 07/11/2023    Polypharmacy 05/26/2023   Left shoulder strain, initial encounter 03/02/2023   Encounter for screening mammogram for malignant neoplasm of breast 03/02/2023   Screening for osteoporosis 03/02/2023   Need for vaccination with 20-polyvalent pneumococcal conjugate vaccine 03/02/2023   Reduced mobility 02/18/2023   Lung nodule 02/07/2023   HFrEF (heart failure with reduced ejection fraction) (HCC) 01/30/2023   ESRD on dialysis (HCC) 08/10/2022   Chronic bilateral back pain 08/10/2022   MDD (major depressive disorder) 08/05/2022   Chronic pain syndrome 08/02/2022   Dysphagia 12/31/2021   Abdominal pain, epigastric 12/31/2021   Insomnia 11/20/2021   Right hip pain 09/23/2021   ESRD (end stage renal disease) on dialysis (HCC) 08/18/2021   Anemia of chronic disease 08/18/2021   Malnutrition of moderate degree 08/08/2021   Esophagitis    Asthma, chronic 11/13/2014   History of stroke 11/13/2014   Atrial fibrillation (HCC) 11/13/2014   Healthcare maintenance 06/18/2013   Hypertension    Hyperlipidemia    GERD (gastroesophageal reflux disease)    PCP:  Modena Slater, DO Pharmacy:   [id number] - [place], [place] - [address] AT [facility] OF [address] [place] [address] Phone: [phone] Fax: [phone]  [facility] [address] [place], [place] - [address] [employer] [address] [place] [address] Phone: [phone] Fax: [phone]  [facility] [address] [place] [address] Phone: [phone] Fax: [phone]  [facility] [place] - [place], [place] - [address] [place] [address] Phone: [phone] Fax: [phone]     Social Determinants of Health (SDOH) Social History: SDOH Screenings   Food Insecurity: Low Risk  (07/24/2023)   Received from [facility]  Housing: Low Risk  (07/11/2023)  Transportation Needs: No  Transportation Needs (07/24/2023)   Received from [facility]  Recent Concern: Transportation Needs - Unmet Transportation Needs (07/11/2023)  Utilities: High Risk (07/24/2023)   Received from [facility]  Depression (PHQ2-9): Medium Risk (05/26/2023)  Tobacco Use: Medium Risk (08/04/2023)   SDOH Interventions:     Readmission Risk Interventions    08/02/2022    3:40 PM  Readmission Risk Prevention Plan  Transportation Screening Complete  PCP or Specialist Appt within 5-7 Days Complete  Home Care Screening Complete  Medication Review (RN CM) Complete
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
## 2023-08-05 NOTE — NC FL2 (Addendum)
[place] MEDICAID FL2 LEVEL OF CARE FORM     IDENTIFICATION  Patient Name: Audrey Hill Birthdate: 1956-07-31 Sex: female Admission Date (Current Location): 08/04/2023  [place] and [place] Number:  Producer, television/film/video and Address:  [facility], [address] [place], [place] [address]      Provider Number: [id number]  Attending Physician Name and Address:  Mercie Eon, MD  Relative Name and Phone Number:       Current Level of Care: Hospital Recommended Level of Care: Skilled Nursing Facility Prior Approval Number:    Date Approved/Denied:   PASRR Number: pending  Discharge Plan: SNF    Current Diagnoses: Patient Active Problem List   Diagnosis Date Noted   Acute on chronic anemia 08/05/2023   Acute encephalopathy 08/04/2023   Acute otitis media 07/11/2023   Polypharmacy 05/26/2023   Left shoulder strain, initial encounter 03/02/2023   Encounter for screening mammogram for malignant neoplasm of breast 03/02/2023   Screening for osteoporosis 03/02/2023   Need for vaccination with 20-polyvalent pneumococcal conjugate vaccine 03/02/2023   Reduced mobility 02/18/2023   Lung nodule 02/07/2023   HFrEF (heart failure with reduced ejection fraction) (HCC) 01/30/2023   ESRD on dialysis (HCC) 08/10/2022   Chronic bilateral back pain 08/10/2022   MDD (major depressive disorder) 08/05/2022   Chronic pain syndrome 08/02/2022   Dysphagia 12/31/2021   Abdominal pain, epigastric 12/31/2021   Insomnia 11/20/2021   Right hip pain 09/23/2021   ESRD (end stage renal disease) on dialysis (HCC) 08/18/2021   Anemia of chronic disease 08/18/2021   Malnutrition of moderate degree 08/08/2021   Esophagitis    Asthma, chronic 11/13/2014   History of stroke 11/13/2014   Atrial fibrillation (HCC) 11/13/2014   Healthcare maintenance 06/18/2013   Hypertension    Hyperlipidemia    GERD (gastroesophageal reflux disease)     Orientation RESPIRATION  BLADDER Height & Weight     Self, Time, Situation, Place  Normal Continent Weight: 145 lb (65.8 kg) Height:  5\' 6"  (167.6 cm)  BEHAVIORAL SYMPTOMS/MOOD NEUROLOGICAL BOWEL NUTRITION STATUS      Continent Diet (see d/c summary)  AMBULATORY STATUS COMMUNICATION OF NEEDS Skin   Extensive Assist Verbally Normal                       Personal Care Assistance Level of Assistance  Bathing, Feeding, Dressing Bathing Assistance: Limited assistance Feeding assistance: Independent Dressing Assistance: Limited assistance     Functional Limitations Info  Sight, Hearing, Speech Sight Info: Impaired Hearing Info: Impaired Speech Info: Adequate    SPECIAL CARE FACTORS FREQUENCY  PT (By licensed PT), OT (By licensed OT)     PT Frequency: 5x/week OT Frequency: 5x/week            Contractures Contractures Info: Not present    Additional Factors Info  Code Status, Allergies Code Status Info: Full code Allergies Info: lisinopril, Metoclopramide, tramadol, acetaminaphen, Nortriptyline, ibuprofen, NSAIDS, Nitroglycerin, Norvasc (amlodipine Besylate)           Current Medications (08/05/2023):  This is the current hospital active medication list Current Facility-Administered Medications  Medication Dose Route Frequency Provider Last Rate Last Admin   albuterol (PROVENTIL) (2.5 MG/3ML) 0.083% nebulizer solution 2.5 mg  2.5 mg Inhalation Q6H PRN Atway, Rayann N, DO       amiodarone (PACERONE) tablet 200 mg  200 mg Oral Daily Atway, Rayann N, DO   200 mg at 08/05/23 8119  atorvastatin (LIPITOR) tablet 20 mg  20 mg Oral Daily Atway, Rayann N, DO   20 mg at 08/05/23 0858   carvedilol (COREG) tablet 3.125 mg  3.125 mg Oral BID WC Atway, Rayann N, DO   3.125 mg at 08/05/23 0857   cefTRIAXone (ROCEPHIN) 1 g in sodium chloride 0.9 % 100 mL IVPB  1 g Intravenous QHS Atway, Rayann N, DO 200 mL/hr at 08/05/23 0259 1 g at 08/05/23 0259   doxycycline (VIBRA-TABS) tablet 100 mg  100 mg Oral Q12H  Atway, Rayann N, DO   100 mg at 08/05/23 0858   HYDROmorphone (DILAUDID) tablet 2 mg  2 mg Oral Q12H PRN Atway, Rayann N, DO   2 mg at 08/05/23 1154   isosorbide dinitrate (ISORDIL) tablet 20 mg  20 mg Oral TID Atway, Rayann N, DO   20 mg at 08/05/23 0912   lidocaine (LIDODERM) 5 % 1 patch  1 patch Transdermal Daily PRN Atway, Rayann N, DO       losartan (COZAAR) tablet 50 mg  50 mg Oral Daily Atway, Rayann N, DO   50 mg at 08/05/23 0858   melatonin tablet 3 mg  3 mg Oral QHS PRN Atway, Rayann N, DO       pantoprazole (PROTONIX) injection 40 mg  40 mg Intravenous Q12H Atway, Rayann N, DO   40 mg at 08/05/23 0858   sertraline (ZOLOFT) tablet 25 mg  25 mg Oral Daily Atway, Rayann N, DO   25 mg at 08/05/23 0857   traZODone (DESYREL) tablet 50 mg  50 mg Oral QHS Atway, Rayann N, DO   50 mg at 08/04/23 2348     Discharge Medications: Please see discharge summary for a list of discharge medications.  Relevant Imaging Results:  Relevant Lab Results:   Additional Information SSN [id number] HD at [facility] TTS chair time at 1130am  [employer], LCSW

## 2023-08-05 NOTE — Progress Notes (Signed)
Pt receives out-pt HD at [facility] on TTS with 11:30 am chair time. Will assist as needed.   Olivia Canter Renal Navigator [phone]

## 2023-08-05 NOTE — Progress Notes (Signed)
HD#0 Subjective:   Summary: Audrey Hill is a 67 y.o. female with pertinent PMH of heart failure, ERSD, depression, atrial fibrillation, chronic pain  who presented with altered mental status and is admitted for encephalopathy and acute on chronic anemia.    Patient continues having some pain in her left side and left knee today.  She states that this started after the fall.  She is unsure why she fell, she cannot pinpoint whether she had a syncopal episode where she tripped.  She says she has sometimes has difficulty remembering to take her medications and, and other times takes her medications too often.  She is still taking her oxycodone for pain, despite the fact that she should be on dilaudid only.   Objective:  Vital signs in last 24 hours: Vitals:   08/05/23 0100 08/05/23 0653 08/05/23 0809 08/05/23 1216  BP: (!) 170/68 (!) 178/87 (!) 192/92 (!) 186/86  Pulse:  73 71   Resp:  18 16   Temp:  98.1 F (36.7 C) 98.7 F (37.1 C)   TempSrc:      SpO2:  97% 96%   Weight:      Height:       Supplemental O2:  SpO2: 96 % O2 Flow Rate (L/min): 2 L/min   Physical Exam:  Constitutional: Chronically ill-appearing, in no acute distress Cardiovascular: Systolic murmur Pulmonary/Chest: normal work of breathing on room air, lungs clear to auscultation bilaterally Abdominal: soft, non-tender, non-distended, positive bowel sounds Neurological: alert & oriented x 3, No focal deficits noted Skin: warm and dry Psych: Normal affect  Filed Weights   08/04/23 1750  Weight: 65.8 kg      Intake/Output Summary (Last 24 hours) at 08/05/2023 1518 Last data filed at 08/05/2023 0705 Gross per 24 hour  Intake 604.4 ml  Output 0 ml  Net 604.4 ml   Net IO Since Admission: 604.4 mL [08/05/23 1518]  Pertinent Labs:    Latest Ref Rng & Units 08/05/2023   12:18 PM 08/05/2023    4:31 AM 08/04/2023    4:46 PM  CBC  WBC 4.0 - 10.5 K/uL 3.3  3.1    Hemoglobin 12.0 - 15.0 g/dL 7.8  7.3   9.9   Hematocrit 36.0 - 46.0 % 24.1  23.0  29.0   Platelets 150 - 400 K/uL 190  174         Latest Ref Rng & Units 08/05/2023    4:31 AM 08/04/2023    4:46 PM 08/04/2023    4:41 PM  CMP  Glucose 70 - 99 mg/dL 95   75   BUN 8 - 23 mg/dL 43   43   Creatinine 1.61 - 1.00 mg/dL 0.96   0.45   Sodium 409 - 145 mmol/L 136  137  138   Potassium 3.5 - 5.1 mmol/L 4.4  3.9  4.0   Chloride 98 - 111 mmol/L 98   97   CO2 22 - 32 mmol/L 30   28   Calcium 8.9 - 10.3 mg/dL 8.0   8.2   Total Protein 6.5 - 8.1 g/dL   7.5   Total Bilirubin 0.3 - 1.2 mg/dL   0.4   Alkaline Phos 38 - 126 U/L   214   AST 15 - 41 U/L   43   ALT 0 - 44 U/L   42     Assessment/Plan:   Principal Problem:   Acute encephalopathy Active Problems:   Hypertension  Hyperlipidemia   ESRD (end stage renal disease) on dialysis (HCC)   Acute on chronic anemia   GAVE (gastric antral vascular ectasia)   # Acute encephalopathy.  # Metabolic alkalosis, with respiratory alkalosis.  Alert and Oriented today.  Much less somnolent today based on note. I would be concerned for some aspect of polypharmacy in this patient with a  extensive drug regiment and difficulty remembering which medications to take / taking old medications.  Largely resolved. Plan: - We would like the patient's family to bring in medications if possible   - Otherwise will bring to PCP follow up.  - PT/ OT     #Pneumonia, CAP Denies CP, but some SOB, cough, increased sputum production. States it is a thick green phlegm which she coughs up. CP with cough, but patient attributes this to recent fall / trauma. Endorses chills. WBC count low on admission. Plan: - Ceftriaxone and doxycycline for CAP  #Acute on chronic anemia # Concern for GI bleed # History of GAVE She was recently hospitalized on 10/19 for acute anemia due to rectal bleeding. She underwent EGD 07/26/23, which showed  thickened gastric folds in the antrum,  and there were 2-3 area that had  small oozing of blood concerning for gastric antral vascular ectasia, treated oozing using APC at GAVE with hemostasis. This AM, endorses some melanotic stools in past, red blood when wiping. We are holding Eliquis. PM CBC showed slightly improved hemoglobin.  Plan: - Transfusion Hb <7 - Holding Eliquis - Iron studies c/w anemia of chronic disease - IV Protonix 40 mg twice daily BID     # ESRD on T/Th/Sat. Stable. CMP with labs consistent with ESRD. Normal K+.   Plan: -Continue HD.  # Hypertension  # HFrEF Persistently hypertensive, improving since resuming home regiment. LVEF 30%-35% Plan: - Losartan 50 mg. - Coreg 3.125 mg.  - Isordil 20mg    Paroxysmal A-fib Stable, on chronic anticoagulation with Eliquis 5 mg. Was on Eliquis 2.5mg  daily in the past. Denies chest pain or palpitations.  EKG shows a normal sinus rhythm.  Plan: -Holding Eliquis 5 mg twice daily. -Continue amiodarone 200 mg.    Chronic stable medical conditions. # Hyperlipidemia - Lipitor 20 mg.  # Chronic pain -Gabapentin 100 mg 3 times daily. -Dilaudid 2 mg twice daily as needed.  # GERD -Holding oral Protonix 40 MG.   # Insomnia -Trazodone 50 mg at bedtime.   # Depression - Zoloft 25 mg  Diet: Normal IVF: None,None VTE: Holding, will resume Eliquis tomorrow  Code: Full PT/OT recs: None, none. TOC recs: None Family Update: Attempted to call daughter   Dispo: Anticipated discharge to Home in 1 days pending stability of symptoms .   Lovie Macadamia MD Internal Medicine Resident PGY-1 Pager: [phone] Please contact the on call pager after 5 pm and on weekends at [phone].

## 2023-08-05 NOTE — Evaluation (Signed)
Physical Therapy Evaluation Patient Details Name: Audrey Hill MRN: [id number] DOB: 1955/12/18 Today's Date: 08/05/2023  History of Present Illness  67 y.o. female who presents emergency department with altered mental status.  She was sent in from her dialysis clinic due to lethargy and mental status change. Past medical history of heart failure end-stage renal disease depression atrial fibrillation, chronic pain and malnutrition, seizures, GERD.  Clinical Impression  Pt admitted with above diagnosis and presents to PT with functional limitations due to deficits listed below (See PT problem list). Pt needs skilled PT to maximize independence and safety. Pt reports she lives with her grandson and that she has been able to mobilize on her own at home. Currently weak and unsteady and needs assist for OOB activity. Pt doesn't know that her grandson will be available to assist that much and she feels she needs to get stronger before she goes home. Patient will benefit from continued inpatient follow up therapy, <3 hours/day.           If plan is discharge home, recommend the following: A little help with walking and/or transfers;A little help with bathing/dressing/bathroom;Assistance with cooking/housework   Can travel by private vehicle   Yes    Equipment Recommendations None recommended by PT  Recommendations for Other Services       Functional Status Assessment Patient has had a recent decline in their functional status and demonstrates the ability to make significant improvements in function in a reasonable and predictable amount of time.     Precautions / Restrictions Precautions Precautions: Fall Restrictions Weight Bearing Restrictions: No      Mobility  Bed Mobility Overal bed mobility: Needs Assistance Bed Mobility: Supine to Sit     Supine to sit: Supervision, HOB elevated     General bed mobility comments: Incr time and effort    Transfers Overall transfer  level: Needs assistance Equipment used: Rolling walker (2 wheels) Transfers: Sit to/from Stand Sit to Stand: Min assist           General transfer comment: Assist to rise and steady. Verbal cues for hand placement    Ambulation/Gait Ambulation/Gait assistance: Min assist Gait Distance (Feet): 25 Feet Assistive device: Rolling walker (2 wheels) Gait Pattern/deviations: Step-to pattern, Decreased step length - right, Decreased step length - left, Shuffle, Trunk flexed Gait velocity: decr Gait velocity interpretation: <1.31 ft/sec, indicative of household ambulator   General Gait Details: Assist for balance and support  Stairs            Wheelchair Mobility     Tilt Bed    Modified Rankin (Stroke Patients Only)       Balance Overall balance assessment: Needs assistance Sitting-balance support: No upper extremity supported, Feet supported Sitting balance-Leahy Scale: Good     Standing balance support: Bilateral upper extremity supported, During functional activity, Reliant on assistive device for balance Standing balance-Leahy Scale: Poor Standing balance comment: walker and CGA for static standing                             Pertinent Vitals/Pain Pain Assessment Pain Assessment: Faces Faces Pain Scale: Hurts even more Pain Location: lt flank/rib and lt leg Pain Descriptors / Indicators: Sharp, Guarding, Grimacing Pain Intervention(s): Limited activity within patient's tolerance, Monitored during session, Repositioned    Home Living Family/patient expects to be discharged to:: Private residence Living Arrangements: Other relatives (grandson) Available Help at Discharge: Family;Available PRN/intermittently Type of Home:  Apartment Home Access: Level entry       Home Layout: One level Home Equipment: Agricultural consultant (2 wheels);Cane - single point      Prior Function Prior Level of Function : Needs assist             Mobility Comments:  Pt reports she amb with cane or rolling walker ADLs Comments: Reports she does her own dressing. Takes sponge bath.     Extremity/Trunk Assessment   Upper Extremity Assessment Upper Extremity Assessment: Defer to OT evaluation    Lower Extremity Assessment Lower Extremity Assessment: Generalized weakness       Communication   Communication Communication: No apparent difficulties  Cognition Arousal: Alert Behavior During Therapy: Flat affect Overall Cognitive Status: No family/caregiver present to determine baseline cognitive functioning Area of Impairment: Memory, Following commands, Problem solving                     Memory: Decreased short-term memory Following Commands: Follows one step commands consistently     Problem Solving: Slow processing, Requires verbal cues          General Comments      Exercises     Assessment/Plan    PT Assessment Patient needs continued PT services  PT Problem List Decreased strength;Decreased activity tolerance;Decreased balance;Decreased mobility;Pain       PT Treatment Interventions DME instruction;Gait training;Functional mobility training;Therapeutic activities;Therapeutic exercise;Balance training;Patient/family education    PT Goals (Current goals can be found in the Care Plan section)  Acute Rehab PT Goals Patient Stated Goal: pt agreeable to plan to get stronger PT Goal Formulation: With patient Time For Goal Achievement: 08/19/23 Potential to Achieve Goals: Good    Frequency Min 1X/week     Co-evaluation               AM-PAC PT "6 Clicks" Mobility  Outcome Measure Help needed turning from your back to your side while in a flat bed without using bedrails?: A Little Help needed moving from lying on your back to sitting on the side of a flat bed without using bedrails?: A Little Help needed moving to and from a bed to a chair (including a wheelchair)?: A Little Help needed standing up from a chair  using your arms (e.g., wheelchair or bedside chair)?: A Little Help needed to walk in hospital room?: A Little Help needed climbing 3-5 steps with a railing? : Total 6 Click Score: 16    End of Session Equipment Utilized During Treatment: Gait belt Activity Tolerance: Patient limited by fatigue Patient left: in chair;with call bell/phone within reach;with chair alarm set;Other (comment) (with OT present) Nurse Communication: Mobility status PT Visit Diagnosis: Unsteadiness on feet (R26.81);Other abnormalities of gait and mobility (R26.89);Muscle weakness (generalized) (M62.81);History of falling (Z91.81);Pain Pain - Right/Left: Left Pain - part of body:  (flank/ribs)    Time: 1610-9604 PT Time Calculation (min) (ACUTE ONLY): 20 min   Charges:   PT Evaluation $PT Eval Moderate Complexity: 1 Mod   PT General Charges $$ ACUTE PT VISIT: 1 Visit         [facility] PT Acute Rehabilitation Services Office [phone]   Angelina Ok [facility] 08/05/2023, 10:24 AM

## 2023-08-05 NOTE — Progress Notes (Signed)
       RE:  Audrey Hill    Date of Birth:  1955/12/19   Date:   08/05/2023     To Whom It May Concern:  Please be advised that the above-named patient will require a short-term nursing home stay - anticipated 30 days or less for rehabilitation and strengthening.  The plan is for return home.

## 2023-08-05 NOTE — Progress Notes (Signed)
New Admission Note: 67m-18   Arrival Method: ED Stretcher Mental Orientation:  Telemetry: box 18 Assessment:completed Skin: intact IV: right ac Pain:9/10 Tubes: n/a Safety Measures:  Admission: completed Orientation: Patient has been oriented to the room, unit and staff.  Family: n/a Belongings: at bedside  Orders have been reviewed and implemented. Will continue to monitor the patient. Call light has been placed within reach and bed alarm has been activated.   Fabian Sharp BSN, RN-BC Phone number: [phone]

## 2023-08-05 NOTE — Evaluation (Signed)
Occupational Therapy Evaluation Patient Details Name: Audrey Hill MRN: [id number] DOB: [birth year] Today's Date: 08/05/2023   History of Present Illness 67 y.o. female who presents emergency department with altered mental status.  She was sent in from her dialysis clinic due to lethargy and mental status change. Past medical history of heart failure end-stage renal disease depression atrial fibrillation, chronic pain and malnutrition, seizures, GERD.   Clinical Impression   PTA pt lived with grandson but does not have 24/7 care at home. Pt able to complete bADLs with CGA  to setup assist but requires min A for STS and gait. Throughout the session her balance was improving but she has random spurs of tremors/jerks affecting her L side which may also be impacting her balance. Pt would benefit from continued acute skilled OT services to address deficits and help transition to next level of care. Patient would benefit from post acute skilled rehab facility with <3 hours of therapy and 24/7 support for now until pt is able to have more support at home or progress with therapy.       If plan is discharge home, recommend the following: A little help with walking and/or transfers;A little help with bathing/dressing/bathroom;Assistance with cooking/housework;Assist for transportation    Functional Status Assessment  Patient has had a recent decline in their functional status and demonstrates the ability to make significant improvements in function in a reasonable and predictable amount of time.  Equipment Recommendations  BSC/3in1    Recommendations for Other Services       Precautions / Restrictions Precautions Precautions: Fall Restrictions Weight Bearing Restrictions: No      Mobility Bed Mobility               General bed mobility comments: Pt up in recliner on arrival    Transfers Overall transfer level: Needs assistance Equipment used: Rolling walker (2  wheels) Transfers: Sit to/from Stand Sit to Stand: Min assist           General transfer comment: CGA from toilet using BSC rails      Balance Overall balance assessment: Needs assistance Sitting-balance support: No upper extremity supported, Feet supported Sitting balance-Leahy Scale: Good     Standing balance support: Bilateral upper extremity supported, During functional activity, Reliant on assistive device for balance Standing balance-Leahy Scale: Poor Standing balance comment: walker and CGA for static standing                           ADL either performed or assessed with clinical judgement   ADL Overall ADL's : Needs assistance/impaired Eating/Feeding: Independent;Sitting   Grooming: Wash/dry hands;Sitting;Contact guard assist   Upper Body Bathing: Sitting;Set up   Lower Body Bathing: Set up;Sitting/lateral leans   Upper Body Dressing : Sitting;Set up   Lower Body Dressing: Sitting/lateral leans;Set up;Supervision/safety Lower Body Dressing Details (indicate cue type and reason): don bilat socks and slip on shoes Toilet Transfer: Minimal assistance;Rolling walker (2 wheels) Toilet Transfer Details (indicate cue type and reason): getting close to CGA Toileting- Clothing Manipulation and Hygiene: Sitting/lateral lean;Set up;Contact guard assist       Functional mobility during ADLs: Minimal assistance;Rolling walker (2 wheels) General ADL Comments: Pt progressing close to CGA level with gait     Vision         Perception         Praxis         Pertinent Vitals/Pain Pain Assessment Pain Assessment: Faces Faces  Pain Scale: Hurts even more Pain Location: lt flank/rib and lt leg Pain Descriptors / Indicators: Sharp, Guarding, Grimacing Pain Intervention(s): Limited activity within patient's tolerance, Monitored during session, Repositioned     Extremity/Trunk Assessment Upper Extremity Assessment Upper Extremity Assessment:  Generalized weakness (3+/5 overall MMT BUEs. LUE tremors at random)   Lower Extremity Assessment Lower Extremity Assessment: Generalized weakness       Communication Communication Communication: No apparent difficulties   Cognition Arousal: Alert Behavior During Therapy: WFL for tasks assessed/performed Overall Cognitive Status: No family/caregiver present to determine baseline cognitive functioning Area of Impairment: Memory, Following commands, Problem solving                     Memory: Decreased short-term memory Following Commands: Follows one step commands consistently     Problem Solving: Slow processing, Requires verbal cues       General Comments  VSS on RA    Exercises     Shoulder Instructions      Home Living Family/patient expects to be discharged to:: Private residence Living Arrangements: Other relatives (grandson) Available Help at Discharge: Family;Available PRN/intermittently Type of Home: Apartment Home Access: Level entry     Home Layout: One level     Bathroom Shower/Tub: Tub/shower unit (takes sponge bath)   Bathroom Toilet: Standard Bathroom Accessibility: Yes   Home Equipment: Agricultural consultant (2 wheels);Cane - single point          Prior Functioning/Environment Prior Level of Function : Needs assist             Mobility Comments: Pt reports she amb with cane or rolling walker ADLs Comments: Reports she does her own dressing. Takes sponge bath.        OT Problem List: Impaired balance (sitting and/or standing);Decreased cognition      OT Treatment/Interventions: Self-care/ADL training;Balance training;Therapeutic exercise;Therapeutic activities;Patient/family education    OT Goals(Current goals can be found in the care plan section) Acute Rehab OT Goals Patient Stated Goal: To go home OT Goal Formulation: With patient Time For Goal Achievement: 08/19/23 Potential to Achieve Goals: Good ADL Goals Pt Will Perform  Grooming: with supervision;standing Pt Will Perform Lower Body Bathing: with set-up;sitting/lateral leans Pt Will Perform Lower Body Dressing: sit to/from stand;with supervision Pt Will Transfer to Toilet: ambulating;with supervision Additional ADL Goal #1: Pt will make bed with supervision to demonstrate ability to complete household management routine without assist.  OT Frequency: Min 1X/week    Co-evaluation              AM-PAC OT "6 Clicks" Daily Activity     Outcome Measure Help from another person eating meals?: None Help from another person taking care of personal grooming?: A Little Help from another person toileting, which includes using toliet, bedpan, or urinal?: A Little Help from another person bathing (including washing, rinsing, drying)?: A Little Help from another person to put on and taking off regular upper body clothing?: A Little Help from another person to put on and taking off regular lower body clothing?: A Little 6 Click Score: 19   End of Session Equipment Utilized During Treatment: Gait belt;Rolling walker (2 wheels) Nurse Communication: Mobility status  Activity Tolerance: Patient tolerated treatment well Patient left: in chair;with call bell/phone within reach;with chair alarm set  OT Visit Diagnosis: Unsteadiness on feet (R26.81);Other symptoms and signs involving cognitive function                Time: 1610-9604 OT Time Calculation (  min): 23 min Charges:  OT General Charges $OT Visit: 1 Visit OT Evaluation $OT Eval Low Complexity: 1 Low OT Treatments $Self Care/Home Management : 8-22 mins  08/05/2023  AB, OTR/L  Acute Rehabilitation Services  Office: [phone]   Tristan Schroeder 08/05/2023, 2:13 PM

## 2023-08-06 DIAGNOSIS — G9340 Encephalopathy, unspecified: Secondary | ICD-10-CM | POA: Diagnosis not present

## 2023-08-06 DIAGNOSIS — J188 Other pneumonia, unspecified organism: Secondary | ICD-10-CM | POA: Diagnosis not present

## 2023-08-06 DIAGNOSIS — N186 End stage renal disease: Secondary | ICD-10-CM

## 2023-08-06 DIAGNOSIS — D62 Acute posthemorrhagic anemia: Secondary | ICD-10-CM | POA: Diagnosis not present

## 2023-08-06 LAB — BASIC METABOLIC PANEL
Anion gap: 8 (ref 5–15)
BUN: 22 mg/dL (ref 8–23)
CO2: 29 mmol/L (ref 22–32)
Calcium: 8.7 mg/dL — ABNORMAL LOW (ref 8.9–10.3)
Chloride: 97 mmol/L — ABNORMAL LOW (ref 98–111)
Creatinine, Ser: 4.24 mg/dL — ABNORMAL HIGH (ref 0.44–1.00)
GFR, Estimated: 11 mL/min — ABNORMAL LOW (ref >60)
Glucose, Bld: 83 mg/dL (ref 70–99)
Potassium: 4.1 mmol/L (ref 3.5–5.1)
Sodium: 134 mmol/L — ABNORMAL LOW (ref 135–145)

## 2023-08-06 LAB — CBC
HCT: 23.5 % — ABNORMAL LOW (ref 36.0–46.0)
Hemoglobin: 7.5 g/dL — ABNORMAL LOW (ref 12.0–15.0)
MCH: 25.6 pg — ABNORMAL LOW (ref 26.0–34.0)
MCHC: 31.9 g/dL (ref 30.0–36.0)
MCV: 80.2 fL (ref 80.0–100.0)
Platelets: 183 10*3/uL (ref 150–400)
RBC: 2.93 MIL/uL — ABNORMAL LOW (ref 3.87–5.11)
RDW: 19.6 % — ABNORMAL HIGH (ref 11.5–15.5)
WBC: 4 10*3/uL (ref 4.0–10.5)
nRBC: 0 % (ref 0.0–0.2)

## 2023-08-06 LAB — GLUCOSE, CAPILLARY: Glucose-Capillary: 92 mg/dL (ref 70–99)

## 2023-08-06 MED ORDER — APIXABAN 2.5 MG PO TABS
2.5000 mg | ORAL_TABLET | Freq: Two times a day (BID) | ORAL | Status: DC
  Administered 2023-08-06 – 2023-08-07 (×3): 2.5 mg via ORAL
  Filled 2023-08-06 (×3): qty 1

## 2023-08-06 MED ORDER — CARVEDILOL 3.125 MG PO TABS
3.1250 mg | ORAL_TABLET | Freq: Once | ORAL | Status: AC
  Administered 2023-08-06: 3.125 mg via ORAL
  Filled 2023-08-06: qty 1

## 2023-08-06 MED ORDER — ACETAMINOPHEN 325 MG PO TABS
325.0000 mg | ORAL_TABLET | Freq: Once | ORAL | Status: AC
  Administered 2023-08-06: 325 mg via ORAL
  Filled 2023-08-06: qty 1

## 2023-08-06 MED ORDER — HYDRALAZINE HCL 25 MG PO TABS
25.0000 mg | ORAL_TABLET | Freq: Three times a day (TID) | ORAL | Status: DC
  Administered 2023-08-06 (×2): 25 mg via ORAL
  Filled 2023-08-06 (×2): qty 1

## 2023-08-06 MED ORDER — CARVEDILOL 6.25 MG PO TABS
6.2500 mg | ORAL_TABLET | Freq: Two times a day (BID) | ORAL | Status: DC
  Administered 2023-08-06 – 2023-08-07 (×2): 6.25 mg via ORAL
  Filled 2023-08-06 (×2): qty 1

## 2023-08-06 MED ORDER — HYDROMORPHONE HCL 2 MG PO TABS
1.0000 mg | ORAL_TABLET | Freq: Four times a day (QID) | ORAL | Status: DC | PRN
  Administered 2023-08-06 – 2023-08-07 (×2): 1 mg via ORAL
  Filled 2023-08-06 (×2): qty 1

## 2023-08-06 MED ORDER — HYDRALAZINE HCL 50 MG PO TABS
50.0000 mg | ORAL_TABLET | Freq: Two times a day (BID) | ORAL | Status: DC
  Administered 2023-08-06: 50 mg via ORAL
  Filled 2023-08-06: qty 1

## 2023-08-06 NOTE — Progress Notes (Signed)
[facility] Progress Note  Subjective: 4.5 L UF overnight last night. No c/o's today. Wants to go home.   Vitals:   08/05/23 2352 08/06/23 0025 08/06/23 0535 08/06/23 0827  BP: (!) 189/103 (!) 221/125 (!) 184/91 (!) 175/81  Pulse: 62 77 66 78  Resp: 18 17 18    Temp: 98.5 F (36.9 C) 98.1 F (36.7 C) 98.1 F (36.7 C) (!) 97.4 F (36.3 C)  TempSrc: Oral   Oral  SpO2: 97% 97% 94% 98%  Weight:      Height:        Exam: Gen alert, no distress, older adult female, on RA 96% No jvd or bruits Chest clear bilat to bases RRR no RG Abd soft ntnd no mass or ascites +bs Ext no LE / UE , no wounds or ulcers Neuro is alert, Ox 3 , nf    AVF+bruit     CXR 11/1 - bilat pulm edema       Renal-related home meds: - phoslo 1 ac - lasix 80 every day - gabapentin 100 tid - hydralazine 25 tid - renavite - oxy IR - eliquis 5 bid - coreg 3.125 bid - isordil 20 tid - losartan 50 qd      OP HD: TTS SW  4h   400/600   64kg   2/2 bath  Hep none  AVF  - last OP HD 10/31, post wt 74.7kg - looks she missed 4 sessions in a row prior to coming this Thursday  - hectorol 5 mcg - mircera 225 q 2 wks, last 10/15     Assessment/ Plan: Pulm edema - not symptomatic but abnormal CXR. Pt misses OP HD sessions regularly. She tells me it is the transportation people's fault. Last night we had 4.5 L UF w/o any BP difficulties. Looks euvolemic today, on RA. High Bp's have improved.  AMS - due to polypharmacy. Seems to have resolved. Per pmd.  ESKD - on HD TTS. Missed 4 OP sessions 10/19 --> 10/31. Had HD here last night. Standing wt 65kg this am after 4.5 L UF overnight. Okay for dc. She should go to her next OP HD on Tuesday.  HTN - cont home meds Anemia of eskd - Hb 7.5- 10 here. 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  Chronic pain syndrome Atrial fib - on eliquis, BB    Rob Blease Capaldi MD  [facility] 08/06/2023, 9:48 AM  Recent Labs  Lab 08/04/23 1641 08/04/23 1646 08/05/23 0431  08/05/23 1218 08/06/23 0422  HGB 8.6*   < > 7.3* 7.8* 7.5*  ALBUMIN 3.0*  --   --   --   --   CALCIUM 8.2*  --  8.0*  --  8.7*  CREATININE 5.42*  --  6.25*  --  4.24*  K 4.0   < > 4.4  --  4.1   < > = values in this interval not displayed.   Recent Labs  Lab 08/05/23 0431  IRON 20*  TIBC 207*  FERRITIN 278   Inpatient medications:  amiodarone  200 mg Oral Daily   apixaban  2.5 mg Oral BID   atorvastatin  20 mg Oral Daily   carvedilol  3.125 mg Oral Once   carvedilol  6.25 mg Oral BID WC   Chlorhexidine Gluconate Cloth  6 each Topical Q0600   diclofenac Sodium  2 g Topical BID   doxycycline  100 mg Oral Q12H   hydrALAZINE  25 mg Oral TID  isosorbide dinitrate  20 mg Oral TID   lidocaine  1 patch Transdermal Q24H   losartan  50 mg Oral Daily   pantoprazole (PROTONIX) IV  40 mg Intravenous Q12H   sertraline  25 mg Oral Daily   traZODone  50 mg Oral QHS    cefTRIAXone (ROCEPHIN)  IV Stopped (08/06/23 0149)   albuterol, HYDROmorphone, melatonin

## 2023-08-06 NOTE — Progress Notes (Signed)
HD#1 Subjective:   Summary: Audrey Hill is a 67 y.o. female with pertinent PMH of heart failure, ERSD, depression, atrial fibrillation, chronic pain  who presented with altered mental status and is admitted for encephalopathy and acute on chronic anemia.    Patient feeling well today.  Continues to have some pain in her right hip which is chronic for her.  Otherwise denies lethargy, altered mental status, or ongoing pain.  She feels that lidocaine and Voltaren gel may have helped with her pain a bit.  Persistently hypertensive today.   Objective:  Vital signs in last 24 hours: Vitals:   08/06/23 0025 08/06/23 0535 08/06/23 0827 08/06/23 1000  BP: (!) 221/125 (!) 184/91 (!) 175/81   Pulse: 77 66 78   Resp: 17 18    Temp: 98.1 F (36.7 C) 98.1 F (36.7 C) (!) 97.4 F (36.3 C)   TempSrc:   Oral   SpO2: 97% 94% 98%   Weight:    65.5 kg  Height:    5\' 6"  (1.676 m)   Supplemental O2:  SpO2: 98 % O2 Flow Rate (L/min): 2 L/min   Physical Exam:  Constitutional: Chronically ill-appearing, in no acute distress Cardiovascular: Systolic murmur Pulmonary/Chest: normal work of breathing on room air, lungs clear to auscultation bilaterally Abdominal: soft, non-tender, non-distended, positive bowel sounds Neurological: alert & oriented x 3, No focal deficits noted Skin: warm and dry Psych: Normal affect  Filed Weights   08/04/23 1750 08/06/23 1000  Weight: 65.8 kg 65.5 kg      Intake/Output Summary (Last 24 hours) at 08/06/2023 1442 Last data filed at 08/06/2023 0944 Gross per 24 hour  Intake 680 ml  Output 4500 ml  Net -3820 ml   Net IO Since Admission: -3,215.6 mL [08/06/23 1442]  Pertinent Labs:    Latest Ref Rng & Units 08/06/2023    4:22 AM 08/05/2023   12:18 PM 08/05/2023    4:31 AM  CBC  WBC 4.0 - 10.5 K/uL 4.0  3.3  3.1   Hemoglobin 12.0 - 15.0 g/dL 7.5  7.8  7.3   Hematocrit 36.0 - 46.0 % 23.5  24.1  23.0   Platelets 150 - 400 K/uL 183  190  174         Latest Ref Rng & Units 08/06/2023    4:22 AM 08/05/2023    4:31 AM 08/04/2023    4:46 PM  CMP  Glucose 70 - 99 mg/dL 83  95    BUN 8 - 23 mg/dL 22  43    Creatinine 1.61 - 1.00 mg/dL 0.96  0.45    Sodium 409 - 145 mmol/L 134  136  137   Potassium 3.5 - 5.1 mmol/L 4.1  4.4  3.9   Chloride 98 - 111 mmol/L 97  98    CO2 22 - 32 mmol/L 29  30    Calcium 8.9 - 10.3 mg/dL 8.7  8.0      Assessment/Plan:   Principal Problem:   Acute encephalopathy Active Problems:   Hypertension   Hyperlipidemia   ESRD (end stage renal disease) on dialysis (HCC)   Acute on chronic anemia   GAVE (gastric antral vascular ectasia)   ESRD (end stage renal disease) (HCC)   # Acute encephalopathy.  # Metabolic alkalosis, with respiratory alkalosis.  Advised daughter to bring patient's medications with her today if she is to visit.  This way we can throughout some of her old medications and consolidate the  medications for her.  She will need PCP follow-up, could use additional resources for medication management.   Plan: - We would like the patient's family to bring in medications if possible   - Otherwise will bring to PCP follow up.  - PT/ OT-recommended rehab, patient declines would prefer to go home.  She says she was at rehab recently and felt that it did not help much.   #Pneumonia, CAP Denies CP, but some SOB, cough, increased sputum production. States it is a thick green phlegm which she coughs up. CP with cough, but patient attributes this to recent fall / trauma. Endorses chills. WBC count low on admission. Plan: - Ceftriaxone and doxycycline for CAP  #Acute on chronic anemia # Concern for GI bleed # History of GAVE Hemoglobin stable in recent days, no new signs of rectal bleeding.  No abdominal pain at this time. Plan: - Transfusion Hb <7 -Resume Eliquis 2.5 twice daily today. - Iron studies c/w anemia of chronic disease - IV Protonix 40 mg twice daily BID     # ESRD on  T/Th/Sat. Stable. CMP with labs consistent with ESRD. Normal K+.   Plan: -Nephrology consulted, HD last night. - Per nephrology will not need dialysis until Tuesday. - Appreciate their help  # Hypertension  # HFrEF Persistently hypertensive, slight improvement after initiating some of home regimen, but still hypertensive.  We will go ahead and make so medication changes today.  LVEF 30%-35% Plan: - Losartan 50 mg. - Coreg 3.125 mg.  Changed to 6.25 mg twice daily today. - Isordil 20mg  3 times daily -Hydral 20 3 times daily.   Paroxysmal A-fib Stable, on chronic anticoagulation with Eliquis 5 mg. Was on Eliquis 2.5mg  daily in the past. Denies chest pain or palpitations.  EKG shows a normal sinus rhythm.  Plan: -Will resume Eliquis 2.5 mg twice daily today, monitor CBC closely. -Continue amiodarone 200 mg.    Chronic stable medical conditions. # Hyperlipidemia - Lipitor 20 mg.  # Chronic pain -Gabapentin 100 mg 3 times daily. -Dilaudid 2 mg twice daily as needed.  # GERD -Holding oral Protonix 40 MG.   # Insomnia -Trazodone 50 mg at bedtime.   # Depression - Zoloft 25 mg  Diet: Normal IVF: None,None VTE: Holding, will resume Eliquis tomorrow  Code: Full PT/OT recs: None, none. TOC recs: None Family Update: Attempted to call daughter   Dispo: Anticipated discharge to Home in 1 days pending resolution of patient hypertension.  Lovie Macadamia MD Internal Medicine Resident PGY-1 Pager: [phone] Please contact the on call pager after 5 pm and on weekends at [phone].

## 2023-08-06 NOTE — Progress Notes (Signed)
Received patient in bed to unit.  Alert and oriented.  Informed consent signed and in chart.   TX duration: 3:30  Patient tolerated well. Fluid challenge for 500 mL d/t sustain BP elevation, successful. Transported back to the room  Alert, without acute distress.  Hand-off given to patient's nurse.   Access used: LUE AVF Access issues: None  Total UF removed: 4500 mL Medication(s) given: None Post HD VS: please data insert    08/05/23 2352  Vitals  Temp 98.5 F (36.9 C)  Temp Source Oral  BP (!) 189/103  MAP (mmHg) 128  BP Location Right Arm  BP Method Automatic  Pulse Rate 62  Pulse Rate Source Monitor  ECG Heart Rate 79  Resp 18  Oxygen Therapy  SpO2 97 %  O2 Device Room Air  Patient Activity (if Appropriate) In bed  Pulse Oximetry Type Continuous  Post Treatment  Dialyzer Clearance Lightly streaked  Hemodialysis Intake (mL) 0 mL  Liters Processed 83.9  Fluid Removed (mL) 4500 mL  Tolerated HD Treatment Yes  Post-Hemodialysis Comments Treatment completed and blood returned without issue.  AVG/AVF Arterial Site Held (minutes) 10 minutes (Gauze applied and secured with paper tape; hemostasis achieved.)  AVG/AVF Venous Site Held (minutes) 10 minutes (Gauze applied and secured with paper tape; hemostasis achieved.)  Note  Patient Observations Patient alert, no c/o voiced, no acute distress noted; patient condition stable for return transport.  Fistula / Graft Left Upper arm Arteriovenous fistula  Placement Date/Time: 08/11/21 0836   Orientation: Left  Access Location: Upper arm  Access Type: Arteriovenous fistula  Site Condition No complications  Fistula / Graft Assessment Present;Thrill;Bruit  Status Flushed;Patent;Deaccessed  Drainage Description None      [facility]

## 2023-08-07 MED ORDER — HYDRALAZINE HCL 50 MG PO TABS
50.0000 mg | ORAL_TABLET | Freq: Two times a day (BID) | ORAL | Status: DC
  Administered 2023-08-07: 50 mg via ORAL
  Filled 2023-08-07: qty 1

## 2023-08-07 MED ORDER — APIXABAN 2.5 MG PO TABS: 2.5000 mg | ORAL_TABLET | Freq: Two times a day (BID) | ORAL | 0 refills | Status: DC

## 2023-08-07 MED ORDER — HYDRALAZINE HCL 50 MG PO TABS: 50.0000 mg | ORAL_TABLET | Freq: Two times a day (BID) | ORAL | 0 refills | Status: DC

## 2023-08-07 MED ORDER — DOXYCYCLINE HYCLATE 100 MG PO TABS: 100.0000 mg | ORAL_TABLET | Freq: Two times a day (BID) | ORAL | 0 refills | Status: DC

## 2023-08-07 MED ORDER — ISOSORBIDE DINITRATE 40 MG PO TABS: 40.0000 mg | ORAL_TABLET | Freq: Two times a day (BID) | ORAL | 0 refills | Status: DC

## 2023-08-07 MED ORDER — HYDROMORPHONE HCL 2 MG PO TABS: 1.0000 mg | ORAL_TABLET | Freq: Four times a day (QID) | ORAL | Status: DC | PRN

## 2023-08-07 MED ORDER — PANTOPRAZOLE SODIUM 40 MG PO TBEC: DELAYED_RELEASE_TABLET | ORAL | Status: DC

## 2023-08-07 MED ORDER — ISOSORBIDE DINITRATE 20 MG PO TABS
40.0000 mg | ORAL_TABLET | Freq: Two times a day (BID) | ORAL | Status: DC
  Administered 2023-08-07: 40 mg via ORAL
  Filled 2023-08-07 (×4): qty 2

## 2023-08-07 NOTE — Discharge Instructions (Signed)
To Ms. KAMORIA LUCIEN or their caretakers,  They were admitted to [facility] on 08/04/2023 for evaluation and treatment of:  Principal Problem:   Acute encephalopathy Active Problems:   Hypertension   Hyperlipidemia   ESRD (end stage renal disease) on dialysis [facility])   Acute on chronic anemia   GAVE (gastric antral vascular ectasia)   ESRD (end stage renal disease) (HCC)  Resolved Problems:   * No resolved hospital problems. *  The evaluation suggested medication side effects. They were treated close observation and discontinuation of high risk medicines.  They were discharged from the hospital on 08/07/23. I recommend the following after leaving the hospital:   Stop taking gabapentin and oxycodone.  Tylenol is safe for you to take.  You can take 1/2 tablet of Dilaudid up to 4 times a day if your pain is very severe.  Your high blood pressure medicines have changed.  See your updated medication list for instructions.  Follow-up in the internal medicine center soon as possible after leaving the hospital.  For questions about your care plan, until you are able to see your primary doctor: Call [phone]. Dial 0 for the operator. Ask for the internal medicine resident on call.  Marrianne Mood MD 08/07/2023, 6:51 PM

## 2023-08-07 NOTE — TOC Transition Note (Signed)
Transition of Care [facility]) - CM/SW Discharge Note   Patient Details  Name: Audrey Hill MRN: [id number] Date of Birth: 11-14-1955  Transition of Care [facility]) CM/SW Contact:  Ronny Bacon, RN Phone Number: 08/07/2023, 11:07 AM   Clinical Narrative:  Patient expected to be discharged today. Patient declined SNF but agrees to [facility]. HH PT/OT arranged through [place] with [facility] with start of service between 24-48 hours.     Final next level of care: Home w Home Health Services Barriers to Discharge: Continued Medical Work up   Patient Goals and CMS Choice      Discharge Placement                         Discharge Plan and Services Additional resources added to the After Visit Summary for                            HH Arranged: PT, OT HH Agency: [facility] Date [facility] Agency Contacted: 08/07/23 Time HH Agency Contacted: 1105 Representative spoke with at [facility] Agency: Laurelyn Sickle  Social Determinants of Health (SDOH) Interventions SDOH Screenings   Food Insecurity: Low Risk  (07/24/2023)   Received from [facility]  Housing: Low Risk  (07/11/2023)  Transportation Needs: No Transportation Needs (07/24/2023)   Received from [facility]  Recent Concern: Transportation Needs - Unmet Transportation Needs (07/11/2023)  Utilities: High Risk (07/24/2023)   Received from [facility]  Depression (PHQ2-9): Medium Risk (05/26/2023)  Tobacco Use: Medium Risk (08/04/2023)     Readmission Risk Interventions    08/02/2022    3:40 PM  Readmission Risk Prevention Plan  Transportation Screening Complete  PCP or Specialist Appt within 5-7 Days Complete  Home Care Screening Complete  Medication Review (RN CM) Complete

## 2023-08-07 NOTE — TOC Progression Note (Signed)
Transition of Care [facility]) - Progression Note    Patient Details  Name: Audrey Hill MRN: [id number] Date of Birth: 1956-02-21  Transition of Care [facility]) CM/SW Contact  Dellie Burns [place], [place] Phone Number: 08/07/2023, 10:19 AM  Clinical Narrative:  spoke to pt re SNF offers and she reports she plans to dc home with [facility] and family support. Pt agreeable to [facility] services, no preferred agency indicated. CM made aware of HH needs and SNF refusal.   Dellie Burns, MSW, LCSW [phone] (coverage)       Expected Discharge Plan: Home w Home Health Services Barriers to Discharge: Continued Medical Work up  Expected Discharge Plan and Services       Living arrangements for the past 2 months: Apartment                                       Social Determinants of Health (SDOH) Interventions SDOH Screenings   Food Insecurity: Low Risk  (07/24/2023)   Received from [facility]  Housing: Low Risk  (07/11/2023)  Transportation Needs: No Transportation Needs (07/24/2023)   Received from [facility]  Recent Concern: Transportation Needs - Unmet Transportation Needs (07/11/2023)  Utilities: High Risk (07/24/2023)   Received from [facility]  Depression (PHQ2-9): Medium Risk (05/26/2023)  Tobacco Use: Medium Risk (08/04/2023)    Readmission Risk Interventions    08/02/2022    3:40 PM  Readmission Risk Prevention Plan  Transportation Screening Complete  PCP or Specialist Appt within 5-7 Days Complete  Home Care Screening Complete  Medication Review (RN CM) Complete

## 2023-08-07 NOTE — Progress Notes (Signed)
[facility] Progress Note  Subjective: 4.5 L UF overnight last night. No c/o's today. Wants to go home.   Vitals:   08/06/23 2049 08/07/23 0529 08/07/23 0825 08/07/23 1211  BP: (!) 150/70 (!) 179/95 (!) 168/71 (!) 174/108  Pulse: (!) 57 68 64 63  Resp:   16   Temp: 98.4 F (36.9 C) 98.6 F (37 C) 98.5 F (36.9 C)   TempSrc: Oral Oral Oral   SpO2: 99% 100% 95% 98%  Weight:      Height:        Exam: Gen alert, no distress, older adult female, on RA 96% No jvd or bruits Chest clear bilat to bases RRR no RG Abd soft ntnd no mass or ascites +bs Ext no LE / UE , no wounds or ulcers Neuro is alert, Ox 3 , nf    AVF+bruit     CXR 11/1 - bilat pulm edema       Renal-related home meds: - phoslo 1 ac - lasix 80 every day - gabapentin 100 tid - hydralazine 25 tid - renavite - oxy IR - eliquis 5 bid - coreg 3.125 bid - isordil 20 tid - losartan 50 qd      OP HD: TTS SW  4h   400/600   64kg   2/2 bath  Hep none  AVF  - last OP HD 10/31, post wt 74.7kg - looks she missed 4 sessions in a row prior to coming this Thursday  - hectorol 5 mcg - mircera 225 q 2 wks, last 10/15     Assessment/ Plan: Pulm edema - not symptomatic but abnormal CXR. Pt misses OP HD sessions regularly. She tells me it is the transportation people's fault. Last night we had 4.5 L UF w/o any BP difficulties. Looks euvolemic today, on RA. High Bp's have improved.  AMS - due to polypharmacy. Seems to have resolved. Per pmd.  ESKD - on HD TTS. Missed 4 OP sessions 10/19 --> 10/31. Had HD here last night. Standing wt 65kg yest after 4.5 L UF w/ HD. Next HD Tuesday if still here.  HTN - cont home meds Anemia of eskd - Hb 7.5- 10 here. 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  Chronic pain syndrome Atrial fib - on eliquis, BB    Rob Liley Rake MD  [facility] 08/07/2023, 3:30 PM  Recent Labs  Lab 08/04/23 1641 08/04/23 1646 08/05/23 0431 08/05/23 1218 08/06/23 0422  HGB 8.6*   < > 7.3* 7.8* 7.5*   ALBUMIN 3.0*  --   --   --   --   CALCIUM 8.2*  --  8.0*  --  8.7*  CREATININE 5.42*  --  6.25*  --  4.24*  K 4.0   < > 4.4  --  4.1   < > = values in this interval not displayed.   Recent Labs  Lab 08/05/23 0431  IRON 20*  TIBC 207*  FERRITIN 278   Inpatient medications:  amiodarone  200 mg Oral Daily   apixaban  2.5 mg Oral BID   atorvastatin  20 mg Oral Daily   carvedilol  6.25 mg Oral BID WC   Chlorhexidine Gluconate Cloth  6 each Topical Q0600   diclofenac Sodium  2 g Topical BID   doxycycline  100 mg Oral Q12H   hydrALAZINE  50 mg Oral BID   isosorbide dinitrate  40 mg Oral BID   lidocaine  1 patch Transdermal Q24H   losartan  50 mg Oral Daily   pantoprazole (PROTONIX) IV  40 mg Intravenous Q12H   sertraline  25 mg Oral Daily   traZODone  50 mg Oral QHS    cefTRIAXone (ROCEPHIN)  IV 1 g (08/06/23 2107)   albuterol, HYDROmorphone, melatonin

## 2023-08-07 NOTE — Discharge Summary (Signed)
Name: Audrey Hill MRN: [id number] DOB: 09-13-1956 67 y.o. PCP: Modena Slater, DO  Date of Admission: 08/04/2023  4:17 PM Date of Discharge: 08/07/2023 7:43 PM Attending Physician: No att. providers found  Discharge diagnosis: Principal Problem:   Acute encephalopathy Active Problems:   Hypertension   Hyperlipidemia   ESRD (end stage renal disease) on dialysis (HCC)   Acute on chronic anemia   GAVE (gastric antral vascular ectasia)   ESRD (end stage renal disease) (HCC)  Resolved Problems:   * No resolved hospital problems. *   Discharge medications: Allergies as of 08/07/2023       Reactions   Lisinopril Rash, Swelling   Metoclopramide    Other reaction(s): Other Patient has dystonia with administration of drug.  Other reaction(s): Other (See Comments) Patient has dystonia with administration of drug.  Patient has dystonia with administration of drug.    Nortriptyline    Other reaction(s): Other (See Comments) unknown "Mini stroke"   Tramadol Nausea And Vomiting      Nitroglycerin Hives, Rash   Ibuprofen Other (See Comments)   Upset gi   Norvasc [amlodipine Besylate] Swelling   Nsaids Other (See Comments)   GI upset        Medication List     STOP taking these medications    amoxicillin-clavulanate 500-125 MG tablet Commonly known as: Augmentin   bisacodyl 5 MG EC tablet Commonly known as: Dulcolax   calcium acetate 667 MG tablet Commonly known as: PHOSLO   Darbepoetin Alfa 100 MCG/0.5ML Sosy injection Commonly known as: ARANESP   gabapentin 100 MG capsule Commonly known as: NEURONTIN   lactulose (encephalopathy) 10 GM/15ML Soln Commonly known as: CHRONULAC   multivitamin Tabs tablet   oxyCODONE 5 MG immediate release tablet Commonly known as: Oxy IR/ROXICODONE       TAKE these medications    albuterol 108 (90 Base) MCG/ACT inhaler Commonly known as: VENTOLIN HFA Inhale 2 puffs into the lungs every 6 (six) hours as needed  (Cough).   amiodarone 200 MG tablet Commonly known as: PACERONE Take 1 tablet (200 mg total) by mouth daily.   apixaban 2.5 MG Tabs tablet Commonly known as: ELIQUIS Take 1 tablet (2.5 mg total) by mouth 2 (two) times daily. What changed:  medication strength how much to take   atorvastatin 20 MG tablet Commonly known as: LIPITOR Take 1 tablet (20 mg total) by mouth daily.   carvedilol 3.125 MG tablet Commonly known as: COREG Take 1 tablet (3.125 mg total) by mouth 2 (two) times daily with a meal.   diclofenac Sodium 1 % Gel Commonly known as: Voltaren Apply 4 g topically 4 (four) times daily.   doxycycline 100 MG tablet Commonly known as: VIBRA-TABS Take 1 tablet (100 mg total) by mouth every 12 (twelve) hours.   furosemide 80 MG tablet Commonly known as: LASIX Take 1 tablet (80 mg total) by mouth daily.   hydrALAZINE 50 MG tablet Commonly known as: APRESOLINE Take 1 tablet (50 mg total) by mouth 2 (two) times daily. What changed:  medication strength how much to take when to take this   HYDROmorphone 2 MG tablet Commonly known as: Dilaudid Take 0.5 tablets (1 mg total) by mouth every 6 (six) hours as needed for severe pain (pain score 7-10). What changed:  how much to take when to take this   isosorbide dinitrate 40 MG tablet Commonly known as: ISORDIL Take 1 tablet (40 mg total) by mouth 2 (two) times daily. What changed:  medication strength how much to take when to take this   lidocaine 5 % Commonly known as: Lidoderm Place 1 patch onto the skin daily as needed. Remove & Discard patch within 12 hours or as directed by MD   losartan 50 MG tablet Commonly known as: COZAAR Take 50 mg by mouth daily.   melatonin 3 MG Tabs tablet Take 1 tablet (3 mg total) by mouth at bedtime.   pantoprazole 40 MG tablet Commonly known as: PROTONIX Take 1 tablet (40 mg total) by mouth 2 (two) times daily for 30 days, THEN 1 tablet (40 mg total) daily. Start taking  on: August 07, 2023 What changed: See the new instructions.   sertraline 25 MG tablet Commonly known as: ZOLOFT Take 1 tablet (25 mg total) by mouth daily.   traZODone 50 MG tablet Commonly known as: DESYREL Take 1 tablet (50 mg total) by mouth at bedtime.        Follow-up appointments:  Follow-up Information     [facility] Follow up.   Specialty: Home Health Services Why: Physical and Occupational therapy. Office will call to arrange follow up after discharge. Contact information: [address] [place] [address] [phone]         Modena Slater, DO Follow up.   Specialty: Internal Medicine Contact information: [address] [place] [address] [phone]                 Disposition and recommendations: Ms. CHARLETTE HENNINGS is a 67 y.o. year old admitted to the hospital for encephalopathy.  She was discharged home in stable condition on hospital day 2.  Encephalopathy Oxycodone and gabapentin discontinued as these were thought to be playing a role in her presentation.  Pain medicine per below.  Chronic pain Back and flanks are pain generators. - Tylenol challenge during admission was unremarkable, this medicine is safe for her - Hydromorphone 1 mg every 6 hours as needed for severe pain  High blood pressure Medicines were increased during admission for systolics as high as 180-200. - Losartan 50 mg daily - Carvedilol 3.125 mg twice daily - Hydralazine 50 mg twice daily - Isosorbide dinitrate 40 mg twice daily  Acute on chronic anemia Most likely anemia of inflammation.  No active bleeding.  PPI increased to twice daily for a month out of an abundance of caution on discharge. - Follow-up CBC  Hospital course: Encephalopathy Presented with somnolence.  Septic, metabolic encephalopathy thought to be unlikely.  No focal deficits made vascular structural causes unlikely.  CNS depressing medicines were held and she  improved.  She was discharged with updated med list per above.  Hypertension Very high blood pressure, difficult to control.  She was asymptomatic, but her outpatient therapy was increased during her hospitalization, namely hydralazine and isosorbide dinitrate.  ESRD Nephrology was consulted for inpatient dialysis for this person.  Acute on chronic anemia Not thought to be due to active bleeding but rather anemia of inflammation.  PPI was increased to twice daily for a month out of an abundance of caution on discharge.  Discharge exam: Feeling okay.  Pain is somewhat improved after small dose of Tylenol yesterday and spacing out Dilaudid somewhat.   Blood pressure (!) 174/108, pulse (!) 55, temperature 98.1 F (36.7 C), resp. rate 18, height 5\' 6"  (1.676 m), weight 65.5 kg, SpO2 96%.  No distress Heart rate and rhythm are normal, radial pulses strong Breathing is regular nonlabored on room air,  lungs clear Skin is warm and dry Abdomen is nontender Alert and oriented  Pertinent studies and procedures:  Imaging Orders         CT HEAD WO CONTRAST         DG Chest Port 1 View    Lab Orders         Resp panel by RT-PCR (RSV, Flu A&B, Covid) Anterior Nasal Swab         Comprehensive metabolic panel         CBC with Differential/Platelet         Urinalysis, Routine w reflex microscopic -Urine, Clean Catch         Ammonia         Salicylate level         Magnesium         HIV Antibody (routine testing w rflx)         Basic metabolic panel         CBC         Chloride, urine, random         Ferritin         Iron and TIBC         CBC         Hepatitis B surface antigen         Hepatitis B surface antibody,quantitative         CBC         Basic metabolic panel         Glucose, capillary         CBG monitoring, ED         CBG monitoring, ED         I-Stat Lactic Acid, ED         I-Stat venous blood gas, ED         CBG monitoring, ED         CBG monitoring, ED         CBG  monitoring, ED     Discharge Instructions:   Discharge Instructions      To Ms. JAYDALEE BARDWELL or their caretakers,  They were admitted to [facility] on 08/04/2023 for evaluation and treatment of:  Principal Problem:   Acute encephalopathy Active Problems:   Hypertension   Hyperlipidemia   ESRD (end stage renal disease) on dialysis (HCC)   Acute on chronic anemia   GAVE (gastric antral vascular ectasia)   ESRD (end stage renal disease) (HCC)  Resolved Problems:   * No resolved hospital problems. *  The evaluation suggested medication side effects. They were treated close observation and discontinuation of high risk medicines.  They were discharged from the hospital on 08/07/23. I recommend the following after leaving the hospital:   Stop taking gabapentin and oxycodone.  Tylenol is safe for you to take.  You can take 1/2 tablet of Dilaudid up to 4 times a day if your pain is very severe.  Your high blood pressure medicines have changed.  See your updated medication list for instructions.  Follow-up in the internal medicine center soon as possible after leaving the hospital.  For questions about your care plan, until you are able to see your primary doctor: Call [phone]. Dial 0 for the operator. Ask for the internal medicine resident on call.  Marrianne Mood MD 08/07/2023, 6:51 PM        Marrianne Mood MD 08/07/2023, 7:43 PM

## 2023-08-08 ENCOUNTER — Telehealth: Payer: Self-pay | Admitting: Physician Assistant

## 2023-08-08 ENCOUNTER — Telehealth: Payer: Self-pay

## 2023-08-08 NOTE — Discharge Planning (Signed)
[facility] Patient Discharge Orders- [facility] CLINIC: [name]  Patient's name: Audrey Hill Admit/DC Dates: 08/04/2023 - 08/07/2023  Discharge Diagnoses: Acute encephalopathy   Pulmonary edema  Aranesp: Given: No   Date and amount of last dose: N/A  Last Hgb: 7.5 PRBC's Given:  Date/# of units: No ESA dose for discharge: mircera 225 mcg IV q 2 weeks  IV Iron dose at discharge: None  Heparin change: no  EDW Change: No New EDW:   Bath Change: No  Access intervention/Change: No Details:  Hectorol/Calcitriol change: No  Discharge Labs: Calcium 8.7 Phosphorus N/A Albumin 3.0 K+ 4.1  IV Antibiotics: No (on PO augmentin) Details:  On Coumadin?: No Last INR: Next INR: Managed By:   OTHER/APPTS/LAB ORDERS:    D/C Meds to be reconciled by nurse after every discharge.  Completed By: Rogers Blocker, PA-C 08/08/2023, 8:10 AM  [facility] Pager: [phone]   Reviewed by: MD:______ RN_______

## 2023-08-08 NOTE — Transitions of Care (Post Inpatient/ED Visit) (Unsigned)
   08/08/2023  Name: Audrey Hill MRN: [id number] DOB: [birth year]  Today's TOC FU Call Status: Today's TOC FU Call Status:: Unsuccessful Call (1st Attempt) Unsuccessful Call (1st Attempt) Date: 08/08/23  Attempted to reach the patient regarding the most recent Inpatient/ED visit.  Follow Up Plan: Additional outreach attempts will be made to reach the patient to complete the Transitions of Care (Post Inpatient/ED visit) call.   Signature Karena Addison, LPN [facility] Nurse Health Advisor Direct Dial [phone]

## 2023-08-08 NOTE — Progress Notes (Signed)
Late Note Entry- Nov. 4, 2024  Pt was d/c yesterday. Contacted [facility] this morning to advise clinic of pt's d/c date and that pt should resume care tomorrow.   Olivia Canter Renal Navigator [phone]

## 2023-08-08 NOTE — Telephone Encounter (Signed)
Incoming fax from pharmacy regarding rx for Isosorbide Dinitrate, pharmacy is requesting a new rx for the preferred alternative which is Isosorbide Nitrate or Isosorbide Mononitrate.

## 2023-08-08 NOTE — Telephone Encounter (Signed)
Transition of care contact from inpatient facility  Date of Discharge: 08/07/23 Date of Contact: 08/08/23 Method of contact: Phone  Attempted to contact patient to discuss transition of care from inpatient admission. Patient did not answer the phone. Message was left on the patient's voicemail with call back instructions.  Rogers Blocker, PA-C 08/08/2023, 11:23 AM  [facility] Pager: [phone]

## 2023-08-09 LAB — HEPATITIS B SURFACE ANTIBODY, QUANTITATIVE: Hep B S AB Quant (Post): 210 m[IU]/mL

## 2023-08-09 NOTE — Transitions of Care (Post Inpatient/ED Visit) (Signed)
08/09/2023  Name: Audrey Hill MRN: [id number] DOB: [birth year]  Today's TOC FU Call Status: Today's TOC FU Call Status:: Successful TOC FU Call Completed Unsuccessful Call (1st Attempt) Date: 08/08/23 [facility] FU Call Complete Date: 08/09/23 Patient's Name and Date of Birth confirmed.  Transition Care Management Follow-up Telephone Call Date of Discharge: 08/07/23 Discharge Facility: [facility]) Type of Discharge: Inpatient Admission Primary Inpatient Discharge Diagnosis:: encephalopathy How have you been since you were released from the hospital?: Better Any questions or concerns?: No  Items Reviewed: Did you receive and understand the discharge instructions provided?: Yes Medications obtained,verified, and reconciled?: Yes (Medications Reviewed) Any new allergies since your discharge?: No Dietary orders reviewed?: Yes Do you have support at home?: Yes People in Home: child(ren), adult  Medications Reviewed Today: Medications Reviewed Today     Reviewed by Karena Addison, LPN (Licensed Practical Nurse) on 08/09/23 at 1728  Med List Status: <None>   Medication Order Taking? Sig Documenting Provider Last Dose Status Informant  albuterol (VENTOLIN HFA) 108 (90 Base) MCG/ACT inhaler [id number] No Inhale 2 puffs into the lungs every 6 (six) hours as needed (Cough). Lovie Macadamia, MD unknown Active Self, Pharmacy Records  amiodarone (PACERONE) 200 MG tablet [id number] No Take 1 tablet (200 mg total) by mouth daily. Lovie Macadamia, MD 08/03/2023 Active Self, Pharmacy Records  apixaban [facility]) 2.5 MG TABS tablet [id number]  Take 1 tablet (2.5 mg total) by mouth 2 (two) times daily. Marrianne Mood, MD  Active   atorvastatin (LIPITOR) 20 MG tablet [id number] No Take 1 tablet (20 mg total) by mouth daily. Lovie Macadamia, MD 08/03/2023 Active Self, Pharmacy Records  carvedilol (COREG) 3.125 MG tablet [id number] No Take 1 tablet (3.125 mg total) by mouth 2 (two) times  daily with a meal. Lovie Macadamia, MD 08/03/2023 Active Self, Pharmacy Records  diclofenac Sodium (VOLTAREN) 1 % GEL [id number] No Apply 4 g topically 4 (four) times daily. Modena Slater, DO unknown Active Self, Pharmacy Records  doxycycline (VIBRA-TABS) 100 MG tablet [id number]  Take 1 tablet (100 mg total) by mouth every 12 (twelve) hours. Marrianne Mood, MD  Active   furosemide (LASIX) 80 MG tablet [id number] No Take 1 tablet (80 mg total) by mouth daily. Lovie Macadamia, MD 08/03/2023 Active Self, Pharmacy Records  hydrALAZINE (APRESOLINE) 50 MG tablet [id number]  Take 1 tablet (50 mg total) by mouth 2 (two) times daily. Marrianne Mood, MD  Active   HYDROmorphone (DILAUDID) 2 MG tablet [id number]  Take 0.5 tablets (1 mg total) by mouth every 6 (six) hours as needed for severe pain (pain score 7-10). Marrianne Mood, MD  Active   isosorbide dinitrate (ISORDIL) 40 MG tablet [id number]  Take 1 tablet (40 mg total) by mouth 2 (two) times daily. Marrianne Mood, MD  Active   lidocaine (LIDODERM) 5 % [id number] No Place 1 patch onto the skin daily as needed. Remove & Discard patch within 12 hours or as directed by MD Modena Slater, DO unknown Active Self, Pharmacy Records  losartan (COZAAR) 50 MG tablet [id number] No Take 50 mg by mouth daily. [provider] 08/03/2023 Active Self, Pharmacy Records           Med Note Arby Barrette   Fri Aug 05, 2023  9:49 AM) LF 01/13/23 90d/s - PT says she thinks she still takes this  melatonin 3 MG TABS tablet [id number] No Take 1 tablet (3 mg total) by mouth at bedtime. Modena Slater, DO 08/03/2023 Active Self, Pharmacy Records  pantoprazole (PROTONIX) 40 MG  tablet [id number]  Take 1 tablet (40 mg total) by mouth 2 (two) times daily for 30 days, THEN 1 tablet (40 mg total) daily. Marrianne Mood, MD  Active   Patient not taking:  Discontinued 06/18/15 0554          Med Note [facility], [place] D   Wed Nov 13, 2014  8:59 PM) Unable to mark for removal    sertraline (ZOLOFT) 25 MG tablet [id number] No Take 1 tablet (25 mg total) by mouth daily. Modena Slater, DO 08/03/2023 Expired 08/05/23 2359 Self, Pharmacy Records  traZODone (DESYREL) 50 MG tablet [id number] No Take 1 tablet (50 mg total) by mouth at bedtime. Lovie Macadamia, MD 08/03/2023 Active Self, Pharmacy Records  Med List Note Wilkie Aye, Melchor Amour 11/15/22 1227): Dialysis Tues., Thurs and Saturday            Home Care and Equipment/Supplies: Were Home Health Services Ordered?: Yes Name of Home Health Agency:: [facility] Has Agency set up a time to come to your home?: Yes First Home Health Visit Date: 08/10/23 Any new equipment or medical supplies ordered?: NA  Functional Questionnaire: Do you need assistance with bathing/showering or dressing?: No Do you need assistance with meal preparation?: No Do you need assistance with eating?: No Do you have difficulty maintaining continence: No Do you need assistance with getting out of bed/getting out of a chair/moving?: No Do you have difficulty managing or taking your medications?: No  Follow up appointments reviewed: PCP Follow-up appointment confirmed?: Yes Date of PCP follow-up appointment?: 08/25/23 Follow-up Provider: K[facility] Mohala Follow-up appointment confirmed?: NA Do you need transportation to your follow-up appointment?: No Do you understand care options if your condition(s) worsen?: Yes-patient verbalized understanding    SIGNATURE Karena Addison, LPN [facility] Nurse Health Advisor Direct Dial [phone]

## 2023-08-15 ENCOUNTER — Other Ambulatory Visit: Payer: Self-pay | Admitting: Internal Medicine

## 2023-08-15 ENCOUNTER — Other Ambulatory Visit: Payer: Self-pay

## 2023-08-15 DIAGNOSIS — M25551 Pain in right hip: Secondary | ICD-10-CM

## 2023-08-15 DIAGNOSIS — K5900 Constipation, unspecified: Secondary | ICD-10-CM

## 2023-08-15 DIAGNOSIS — I10 Essential (primary) hypertension: Secondary | ICD-10-CM

## 2023-08-15 MED ORDER — HYDROMORPHONE HCL 2 MG PO TABS
1.0000 mg | ORAL_TABLET | Freq: Four times a day (QID) | ORAL | 0 refills | Status: DC | PRN
Start: 2023-08-15 — End: 2023-09-12

## 2023-08-15 MED ORDER — HYDROMORPHONE HCL 2 MG PO TABS
1.0000 mg | ORAL_TABLET | Freq: Four times a day (QID) | ORAL | 0 refills | Status: DC | PRN
Start: 2023-08-15 — End: 2023-08-15

## 2023-08-15 MED ORDER — CARVEDILOL 3.125 MG PO TABS: 3.1250 mg | ORAL_TABLET | Freq: Two times a day (BID) | ORAL | 3 refills | Status: DC

## 2023-08-15 MED ORDER — SENNOSIDES 8.6 MG PO TABS: 2.0000 | ORAL_TABLET | Freq: Every day | ORAL | 0 refills | Status: DC

## 2023-08-15 NOTE — Telephone Encounter (Signed)
Reviewed Dr Mammie Lorenzo note from 07/11/23.  Has been on dilaudid/opiate Rx long term. Plan was for Dilaudid 2mg  up to twice daily, #60 per month.  Needs follow up visit for signing pain contract.  Have updated to 60 and reviewed PMDP.

## 2023-08-15 NOTE — Telephone Encounter (Signed)
Pt  has an upcoming with DR ATWAY 11/21 .Marland Kitchen So  I placed signed  pain contract per Dr Mikey Bussing in the encounter note .Marland KitchenMarland Kitchen

## 2023-08-16 NOTE — Progress Notes (Signed)
Pneumonia has been ruled out

## 2023-08-16 NOTE — Progress Notes (Signed)
Toxic encephalopathy, likely from prescription drugs (oxycodone)

## 2023-08-25 ENCOUNTER — Telehealth: Payer: Self-pay | Admitting: *Deleted

## 2023-08-25 ENCOUNTER — Ambulatory Visit: Payer: Medicare HMO | Admitting: Internal Medicine

## 2023-08-25 VITALS — BP 167/78 | HR 60 | Temp 98.0°F | Ht 66.0 in | Wt 143.9 lb

## 2023-08-25 DIAGNOSIS — G894 Chronic pain syndrome: Secondary | ICD-10-CM

## 2023-08-25 DIAGNOSIS — I10 Essential (primary) hypertension: Secondary | ICD-10-CM

## 2023-08-25 DIAGNOSIS — K5900 Constipation, unspecified: Secondary | ICD-10-CM

## 2023-08-25 DIAGNOSIS — N186 End stage renal disease: Secondary | ICD-10-CM

## 2023-08-25 DIAGNOSIS — Z79899 Other long term (current) drug therapy: Secondary | ICD-10-CM

## 2023-08-25 DIAGNOSIS — I120 Hypertensive chronic kidney disease with stage 5 chronic kidney disease or end stage renal disease: Secondary | ICD-10-CM

## 2023-08-25 DIAGNOSIS — Z992 Dependence on renal dialysis: Secondary | ICD-10-CM

## 2023-08-25 MED ORDER — SENNOSIDES 8.6 MG PO TABS
2.0000 | ORAL_TABLET | Freq: Every day | ORAL | 3 refills | Status: AC
Start: 2023-08-25 — End: 2023-12-23

## 2023-08-25 NOTE — Progress Notes (Signed)
  Care Coordination   Note   08/25/2023 Name: SAFAA GRENELL MRN: [id number] DOB: 10-09-1955  CATELAYA CHOKSI is a 67 y.o. year old female who sees Modena Slater, DO for primary care. I reached out to [name] by phone today to offer care coordination services.  Ms. Kopinski was given information about Care Coordination services today including:   The Care Coordination services include support from the care team which includes your Nurse Coordinator, Clinical Social Worker, or Pharmacist.  The Care Coordination team is here to help remove barriers to the health concerns and goals most important to you. Care Coordination services are voluntary, and the patient may decline or stop services at any time by request to their care team member.   Care Coordination Consent Status: Patient agreed to services and verbal consent obtained.   Follow up plan:  Telephone appointment with care coordination team member scheduled for:  09/12/23 with PharmD and 08/31/23   Encounter Outcome:  Patient Scheduled  Gwenevere Ghazi  Care Coordination Care Guide  Direct Dial: [phone]

## 2023-08-25 NOTE — Assessment & Plan Note (Signed)
The patient has hypertension, afib, and HFrEF and is on coreg 3.125 mg bid, amiodarone 200 mg daily, lasix 80 mg daily, hydralazine 50 mg bid, isordil 40 mg bid, and losartan 50 mg daily.  BP Readings from Last 3 Encounters:  08/25/23 (!) 167/78  08/07/23 (!) 174/108  07/11/23 (!) 151/61   The patient remains hypertensive at this time, however, she did not take any of her blood pressure medications yet this morning since she will be going to dialysis shortly.
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
## 2023-08-25 NOTE — Assessment & Plan Note (Signed)
The patient has chronic pain and was previously prescribed oxycodone, however, she was recently switched to dilaudid since she is on dialysis. She was recently admitted from 10/31-11/3 with encephalopathy in the setting of accidentally taking both oxycodone and dilaudid, as she did not understand which medication she was supposed to be on. The patient is only taking dilaudid 1 mg q6h prn for her pain at this time. Pain contract reviewed and signed by the patient at this office visit.   PDMP reviewed, not due for a refill of dilaudid at this time. Also prescribed senna to take twice daily to prevent opioid induced constipation.

## 2023-08-25 NOTE — Assessment & Plan Note (Addendum)
The patient is on a very extensive medication regiment.  For her cardiovascular disease (HFrEF, Afib, HTN) she is on amiodarone 200 mg daily, eliquis 5 mg bid, coreg 3.125 mg bid, lasix 80 mg daily, hydralazine 50 mg bid, isordil 40 mg bid, and losartan 50 mg daily.  For her ESRD on HD, she takes Rena-vite and calcium 667 mg TID.   For her HLD she is on atorvastatin 20 mg daily.  For her GERD she takes protonix 40 mg daily.   For her chronic pain, she takes dilaudid 1 mg q6h prn and senna twice daily to prevent opioid induced constipation.   The patient also is prescribed trazodone for her MDD and to help her sleep, however, she did not bring this medication with her today and believes it is at her bedside. She does not have zoloft with her either and is not sure if she is still taking it (although it is prescribed).   A/P: The patient has significant difficulties managing her medications and states that her daughter also has difficulties managing them. I have placed an order for a pharmacist/nurse manager to call the patient and her daughter to help them put together a pill pack. I advised the patient to come back to the clinic on a non-HD day so we can do a more thorough review of her meds too.

## 2023-08-25 NOTE — Patient Instructions (Signed)
Thank you, Ms.Lynniah JONITA TRUAN for allowing [place] to provide your care today. Today we discussed:  We are going to have a pharmacist/nurse call you to help you and/or your daughter put your medications together  Go to dialysis today so you can hopefully stay out of the hospital  Come back ASAP on a non-dialysis day and we will review everything again  Pain contract signed today  I gave you a medication called Senna - this medication will help you move your bowels since you are on a hydromorphone (pain medicine) that can make you constipated  I have ordered the following labs for you:  Lab Orders  No laboratory test(s) ordered today      Referrals ordered today:    Referral Orders         AMB Referral [facility] Care Management      I have ordered the following medication/changed the following medications:   Stop the following medications: Medications Discontinued During This Encounter  Medication Reason   doxycycline (VIBRA-TABS) 100 MG tablet    senna (SENOKOT) 8.6 MG tablet Reorder     Start the following medications: Meds ordered this encounter  Medications   senna (SENOKOT) 8.6 MG tablet    Sig: Take 2 tablets (17.2 mg total) by mouth daily.    Dispense:  60 tablet    Refill:  3     Follow up:  as soon as possible    Should you have any questions or concerns please call the internal medicine clinic at [phone].     Elza Rafter, D.O. [facility]

## 2023-08-26 NOTE — Progress Notes (Signed)
Internal Medicine Clinic Attending  Case discussed with the resident at the time of the visit.  We reviewed the resident's history and exam and pertinent patient test results.  I agree with the assessment, diagnosis, and plan of care documented in the resident's note.    Visit was limited by time because this visit was scheduled on a dialysis day and we needed to get patient to dialysis. She has been recently hospitalized in the setting of polypharmacy, so I think getting her more care coordination is essential to her health.   She has an appointment with Juanell Fairly next week and with a pharmacist in December. At pharm visit, I think it would be most helpful for pharmacist to speak with patient's daughter to do a med rec. We gave patient pill boxes today.

## 2023-08-31 ENCOUNTER — Ambulatory Visit: Payer: Self-pay

## 2023-08-31 NOTE — Patient Outreach (Signed)
Care Coordination   Initial Visit Note   08/31/2023 Name: Audrey Hill MRN: [id number] DOB: [birth year]  Audrey Hill is a 67 y.o. year old female who sees Modena Slater, DO for primary care. I spoke with  Audrey Hill by phone today.  What matters to the patients health and wellness today?  I recently engaged in a conversation with Audrey Hill, who resides in her home with her grandson. She is receiving assistance from an aide . Audrey Hill attends dialysis sessions on Tuesdays, Thursdays, and Saturdays. During our discussion, she expressed a need for assistance with the delivery of her medications to her residence.  Audrey Hill confirmed that she has transportation available for her medical appointments. We reached out to [facility] to explore the possibility of medication delivery. They indicated that her insurance may influence this decision, as it might cover some costs associated with delivery, potentially resulting in a copayment of four or five dollars. They also mentioned the availability of a home delivery service that could be coordinated through her insurance provider. We have arranged to follow up next week to ascertain the most suitable option for her medication delivery. At the time of our conversation, Audrey Hill reported that she was feeling relatively well. Her aide was assisting her in preparing for her dialysis appointment, and we plan to continue our discussion next week.      Goals Addressed             This Visit's Progress    I want my health to be stable       Patient Goals/Self Care Activities: -Patient/Caregiver will take medications as prescribed   -Patient/Caregiver will attend all scheduled provider appointments -Patient/Caregiver will call pharmacy for medication refills 3-7 days in advance of running out of medications -Patient/Caregiver will call provider office for new concerns or questions  -Patient/Caregiver will  focus on medication adherence by taking medications as prescribed   Reviewed medications with patient and discussed importance of compliance    Discussed complications of poorly controlled blood pressure such as heart disease, stroke, circulatory complications, vision complications, kidney impairment, sexual dysfunction    Reviewed scheduled/upcoming provider appointments including      Reviewed importance of adherence to anticoagulant exactly as prescribed Counseled on importance of regular laboratory monitoring as prescribed Counseled on seeking medical attention after a head injury or if there is blood in the urine/stool            SDOH assessments and interventions completed:  Yes  SDOH Interventions Today    Flowsheet Row Most Recent Value  SDOH Interventions   Food Insecurity Interventions Intervention Not Indicated  Housing Interventions AMB Referral  Transportation Interventions Intervention Not Indicated        Care Coordination Interventions:  Yes, provided   Interventions Today    Flowsheet Row Most Recent Value  Chronic Disease   Chronic disease during today's visit Chronic Kidney Disease/End Stage Renal Disease (ESRD), Atrial Fibrillation (AFib)  General Interventions   General Interventions Discussed/Reviewed General Interventions Discussed, General Interventions Reviewed, Communication with  Communication with Pharmacists  Education Interventions   Education Provided Provided Education  Provided Verbal Education On Nutrition  Pharmacy Interventions   Pharmacy Dicussed/Reviewed Pharmacy Topics Discussed  Safety Interventions   Safety Discussed/Reviewed Safety Discussed        Follow up plan: Follow up call scheduled for 09/17/23  10 am    Encounter Outcome:  Patient Visit Completed   Audrey Fairly RN, BSN, [facility]  [facility]  [facility], [facility]  Care Coordinator Phone: [phone]

## 2023-08-31 NOTE — Patient Instructions (Signed)
Visit Information  Thank you for taking time to visit with me today. Please don't hesitate to contact me if I can be of assistance to you.   Following are the goals we discussed today:   Goals Addressed             This Visit's Progress    I want my health to be stable       Patient Goals/Self Care Activities: -Patient/Caregiver will take medications as prescribed   -Patient/Caregiver will attend all scheduled provider appointments -Patient/Caregiver will call pharmacy for medication refills 3-7 days in advance of running out of medications -Patient/Caregiver will call provider office for new concerns or questions  -Patient/Caregiver will focus on medication adherence by taking medications as prescribed   Reviewed medications with patient and discussed importance of compliance    Discussed complications of poorly controlled blood pressure such as heart disease, stroke, circulatory complications, vision complications, kidney impairment, sexual dysfunction    Reviewed scheduled/upcoming provider appointments including      Reviewed importance of adherence to anticoagulant exactly as prescribed Counseled on importance of regular laboratory monitoring as prescribed Counseled on seeking medical attention after a head injury or if there is blood in the urine/stool            Our next appointment is by telephone on 09/17/23 at 10 am  Please call the care guide team at [phone] if you need to cancel or reschedule your appointment.   If you are experiencing a Mental Health or Behavioral Health Crisis or need someone to talk to, please call 1-800-273-TALK (toll free, 24 hour hotline)  The patient verbalized understanding of instructions, educational materials, and care plan provided today.    Juanell Fairly RN, BSN, [facility]  [facility], [facility]  Care Coordinator Phone: [phone]

## 2023-09-03 ENCOUNTER — Other Ambulatory Visit: Payer: Self-pay | Admitting: Student

## 2023-09-03 DIAGNOSIS — G4700 Insomnia, unspecified: Secondary | ICD-10-CM

## 2023-09-03 DIAGNOSIS — I10 Essential (primary) hypertension: Secondary | ICD-10-CM

## 2023-09-06 ENCOUNTER — Telehealth: Payer: Self-pay

## 2023-09-06 NOTE — Telephone Encounter (Signed)
Return pt's call - no answer; left message on pt's vm of office's return call.

## 2023-09-06 NOTE — Telephone Encounter (Signed)
Requesting pain medication for leg pain, please call pt back.

## 2023-09-07 ENCOUNTER — Ambulatory Visit: Payer: Self-pay

## 2023-09-07 NOTE — Patient Outreach (Signed)
  Care Coordination   Follow Up Visit Note   09/07/2023 Name: Audrey Hill MRN: [id number] DOB: March 23, 1956  Audrey Hill is a 67 y.o. year old female who sees Modena Slater, DO for primary care. I spoke with  [name] by phone today.  What matters to the patients health and wellness today?  RNCM spoke with the patient and she stated that she could not talk at the time and asked if I cold could her back later today.   Addendum: Called the patient at 303 pm and she did not answer the phone a message was left.  SDOH assessments and interventions completed:  No     Care Coordination Interventions:  No, not indicated   Follow up plan: Follow up call scheduled for 09/07/23  3 pm    Encounter Outcome:  Patient Visit Completed   Juanell Fairly RN, BSN, [facility] [place]  [facility], [facility]  Care Coordinator Phone: [phone]

## 2023-09-12 ENCOUNTER — Other Ambulatory Visit: Payer: Self-pay | Admitting: Pharmacist

## 2023-09-12 ENCOUNTER — Other Ambulatory Visit: Payer: Self-pay

## 2023-09-12 DIAGNOSIS — M25551 Pain in right hip: Secondary | ICD-10-CM

## 2023-09-12 MED ORDER — HYDROMORPHONE HCL 2 MG PO TABS: 1.0000 mg | ORAL_TABLET | Freq: Four times a day (QID) | ORAL | 0 refills | Status: DC | PRN

## 2023-09-12 NOTE — Progress Notes (Unsigned)
09/12/2023 Name: DZARIA SCHECKEL MRN: [id number] DOB: [birth year]  Chief Complaint  Patient presents with   Medication Management    Audrey Hill is a 67 y.o. year old female who presented for a telephone visit.   They were referred to the pharmacist by their PCP for assistance in managing complex medication management.   Hospitalizations for GI bleed from 11/24 to 11/29  Subjective:  Care Team: Primary Care Provider: Modena Slater, DO ; Next Scheduled Visit: 09/14/23 Clinical Pharmacist: Marlowe Aschoff, PharmD  Medication Access/Adherence  Current Pharmacy:  [id number] - [place], [place] - [address] AT [facility] [address] [place] [address] Phone: [phone] Fax: [phone]  [facility] - [place], [place] - [address] [place] [address] Phone: [phone] Fax: [phone]  [id number] - [place], [place] - [address] AT [facility] [address] [place] [address] Phone: [phone] Fax: [phone]   Patient reports affordability concerns with their medications: No  Patient reports access/transportation concerns to their pharmacy: Yes - does NOT have a car but can drive Patient reports adherence concerns with their medications:   Confusion with medications due to recent hospitalization     Medication Management:  Current adherence strategy: Pill box  Patient reports Fair adherence to medications    Objective:  Lab Results  Component Value Date   HGBA1C 5.6 08/03/2021    Lab Results  Component Value Date   CREATININE 4.24 (H) 08/06/2023   BUN 22 08/06/2023   NA 134 (L) 08/06/2023   K 4.1 08/06/2023   CL 97 (L) 08/06/2023   CO2 29 08/06/2023    Lab Results  Component Value Date   CHOL 113 08/05/2022   HDL 72 08/05/2022   LDLCALC 34 08/05/2022   TRIG 33 08/05/2022   CHOLHDL 1.6  08/05/2022    Medications Reviewed Today     Reviewed by Pollie Friar, RPH (Pharmacist) on 09/12/23 at 1558  Med List Status: <None>   Medication Order Taking? Sig Documenting Provider Last Dose Status Informant  albuterol (VENTOLIN HFA) 108 (90 Base) MCG/ACT inhaler [id number] No Inhale 2 puffs into the lungs every 6 (six) hours as needed (Cough).  Patient not taking: Reported on 09/12/2023   Lovie Macadamia, MD Not Taking Active Self, Pharmacy Records  amiodarone (PACERONE) 200 MG tablet [id number] Yes Take 1 tablet (200 mg total) by mouth daily. Lovie Macadamia, MD Taking Active Self, Pharmacy Records  apixaban [facility]) 2.5 MG TABS tablet [id number] No Take 1 tablet (2.5 mg total) by mouth 2 (two) times daily.  Patient not taking: Reported on 09/12/2023   Marrianne Mood, MD Not Taking Active   atorvastatin (LIPITOR) 20 MG tablet [id number] Yes Take 1 tablet (20 mg total) by mouth daily. Lovie Macadamia, MD Taking Active Self, Pharmacy Records  Calcium Acetate 667 MG TABS [id number] Yes Take 1 tablet by mouth 3 (three) times daily with meals. [provider] Taking Active   carvedilol (COREG) 3.125 MG tablet [id number] Yes Take 1 tablet (3.125 mg total) by mouth 2 (two) times daily with a meal. Gwenevere Abbot, MD Taking Active   diclofenac Sodium (VOLTAREN) 1 % GEL [id number] No Apply 4 g topically 4 (four) times daily.  Patient not taking: Reported on 09/12/2023   Modena Slater, DO Not Taking Active Self, Pharmacy Records  furosemide (  LASIX) 80 MG tablet [id number] Yes Take 1 tablet (80 mg total) by mouth daily. Lovie Macadamia, MD Taking Active Self, Pharmacy Records  hydrALAZINE (APRESOLINE) 25 MG tablet [id number] Yes TAKE 1 TABLET(25 MG) BY MOUTH THREE TIMES DAILY Modena Slater, DO Taking Active   hydrALAZINE (APRESOLINE) 50 MG tablet [id number] No Take 1 tablet (50 mg total) by mouth 2 (two) times daily.  Patient not taking: Reported on 09/12/2023   Marrianne Mood, MD Not  Taking Active   HYDROmorphone (DILAUDID) 2 MG tablet [id number] No Take 0.5 tablets (1 mg total) by mouth every 6 (six) hours as needed for severe pain (pain score 7-10).  Patient not taking: Reported on 09/12/2023   Katheran James, DO Not Taking Active   isosorbide dinitrate (ISORDIL) 40 MG tablet [id number] No Take 1 tablet (40 mg total) by mouth 2 (two) times daily.  Patient not taking: Reported on 09/12/2023   Marrianne Mood, MD Not Taking Active   lidocaine (LIDODERM) 5 % [id number] No Place 1 patch onto the skin daily as needed. Remove & Discard patch within 12 hours or as directed by MD  Patient not taking: Reported on 09/12/2023   Modena Slater, DO Not Taking Active Self, Pharmacy Records  losartan (COZAAR) 50 MG tablet [id number] Yes Take 50 mg by mouth daily. [provider] Taking Active Self, Pharmacy Records           Med Note Arby Barrette   Fri Aug 05, 2023  9:49 AM) LF 01/13/23 90d/s - PT says she thinks she still takes this  multivitamin (RENA-VIT) TABS tablet [id number] Yes Take 1 tablet by mouth daily. [provider] Taking Active   pantoprazole (PROTONIX) 40 MG tablet [id number] No Take 1 tablet (40 mg total) by mouth 2 (two) times daily for 30 days, THEN 1 tablet (40 mg total) daily.  Patient not taking: Reported on 09/12/2023   Marrianne Mood, MD Not Taking Active    Patient not taking:   Discontinued 06/18/15 0554          Med Note [facility] [place] D   Wed Nov 13, 2014  8:59 PM) Unable to mark for removal   senna (SENOKOT) 8.6 MG tablet [id number] Yes Take 2 tablets (17.2 mg total) by mouth daily. Chauncey Mann, DO Taking Active   sertraline (ZOLOFT) 25 MG tablet [id number] No Take 1 tablet (25 mg total) by mouth daily.  Patient not taking: Reported on 09/12/2023   Modena Slater, DO Not Taking Expired 08/05/23 2359 Self, Pharmacy Records  traZODone (DESYREL) 50 MG tablet [id number] Yes TAKE 1 TABLET(50 MG) BY MOUTH AT BEDTIME Modena Slater, DO Taking Active    Med List Note Lenoria Farrier, CPhT 11/15/22 1227): Dialysis Tues., Thurs and Saturday              Assessment/Plan:   Medication Management: - Currently strategy insufficient to maintain appropriate adherence to prescribed medication regimen - Suggested use of weekly pill box to organize medications - Discussed switching medications from [facility] to compliance mail order packaging- patient denied at this time  Also does NOT have BP cuff at home to see how well BP medication has been doing   Follow Up Plan:  - Follow-up in 1 month to see if medications are being taking appropriately and ask about adherence packaging again  Concerns for PCP to address on 12/11 visit: - Re-starting Eliquis 2.5mg  now? - Recommend continuation of Pantoprazole- reports not having any leftover (only possibility is she was taking it twice  a day; p/u 10/25 for 60DS) - Needs Albuterol refill - Has NO pain medication of Dilaudid or Oxycodone per patient- takes occasional Tylenol for pain  - Says Dilaudid 30DS sold on 08/15/23- should not be out until 12/11 - Check BP- taking Hydralazine 25mg  TID rather than 50mg  BID prescribed at hospital discharge - Requesting Sertraline be restarted- reports getting "cranky" occasionally  *Also reports she has severe leg pain and ate moldy bread causing stomach upset yesterday- thought she was going to have to go to the hospital for it  Marlowe Aschoff, PharmD [facility] Phone Number: [phone]

## 2023-09-14 ENCOUNTER — Ambulatory Visit: Payer: Medicare HMO | Admitting: Internal Medicine

## 2023-09-14 ENCOUNTER — Encounter: Payer: Self-pay | Admitting: Internal Medicine

## 2023-09-14 ENCOUNTER — Telehealth: Payer: Self-pay | Admitting: *Deleted

## 2023-09-14 VITALS — BP 168/75 | HR 55 | Temp 98.3°F | Ht 66.0 in | Wt 147.6 lb

## 2023-09-14 DIAGNOSIS — K2100 Gastro-esophageal reflux disease with esophagitis, without bleeding: Secondary | ICD-10-CM

## 2023-09-14 DIAGNOSIS — K31819 Angiodysplasia of stomach and duodenum without bleeding: Secondary | ICD-10-CM | POA: Diagnosis not present

## 2023-09-14 DIAGNOSIS — Z79899 Other long term (current) drug therapy: Secondary | ICD-10-CM

## 2023-09-14 DIAGNOSIS — G894 Chronic pain syndrome: Secondary | ICD-10-CM

## 2023-09-14 DIAGNOSIS — I10 Essential (primary) hypertension: Secondary | ICD-10-CM

## 2023-09-14 DIAGNOSIS — M25551 Pain in right hip: Secondary | ICD-10-CM | POA: Diagnosis not present

## 2023-09-14 DIAGNOSIS — J45909 Unspecified asthma, uncomplicated: Secondary | ICD-10-CM

## 2023-09-14 DIAGNOSIS — I4891 Unspecified atrial fibrillation: Secondary | ICD-10-CM

## 2023-09-14 DIAGNOSIS — Z139 Encounter for screening, unspecified: Secondary | ICD-10-CM | POA: Insufficient documentation

## 2023-09-14 MED ORDER — PANTOPRAZOLE SODIUM 40 MG PO TBEC: 40.0000 mg | DELAYED_RELEASE_TABLET | Freq: Every day | ORAL | 1 refills | Status: DC

## 2023-09-14 MED ORDER — SERTRALINE HCL 25 MG PO TABS: 25.0000 mg | ORAL_TABLET | Freq: Every day | ORAL | 2 refills | Status: DC

## 2023-09-14 MED ORDER — ALBUTEROL SULFATE HFA 108 (90 BASE) MCG/ACT IN AERS
2.0000 | INHALATION_SPRAY | Freq: Four times a day (QID) | RESPIRATORY_TRACT | 1 refills | Status: DC | PRN
Start: 2023-09-14 — End: 2023-12-14

## 2023-09-14 MED ORDER — HYDROMORPHONE HCL 2 MG PO TABS: 1.0000 mg | ORAL_TABLET | Freq: Four times a day (QID) | ORAL | 0 refills | Status: AC | PRN

## 2023-09-14 MED ORDER — LIDOCAINE 5 % EX PTCH: 1.0000 | MEDICATED_PATCH | Freq: Every day | CUTANEOUS | 0 refills | Status: DC | PRN
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
## 2023-09-14 NOTE — Assessment & Plan Note (Signed)
The patient states that she has been eating moldy bread, as she did not notice that it was moldy when she first brought the bread.  I asked the patient if she has other food at her home to eat aside from this bread.  She states that she does, and is not in need of any assistance time.

## 2023-09-14 NOTE — Addendum Note (Signed)
Addended by: Elza Rafter on: 09/14/2023 03:47 PM   Modules accepted: Orders

## 2023-09-14 NOTE — Assessment & Plan Note (Addendum)
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 She was admitted to [facility] from 11/24-11/29 with melenic stools. She was hemodynamically stable with a hemoglobin of 10.3 on admission.  CTA was negative for any active GI bleed.  During her admission her hemoglobin remained stable and GI did not feel that she would benefit from endoscopy.  The patient did not have any more GI bleeding episodes during her hospitalization, after her Eliquis was held.  Refilled Protonix 40 mg daily.

## 2023-09-14 NOTE — Assessment & Plan Note (Addendum)
The patient has chronic pain and was previously prescribed oxycodone, however she was switched to Dilaudid since she is on dialysis.  The patient was admitted from 10/21 to 11/3 with encephalopathy in the setting of accidentally taking both oxycodone and Dilaudid.  At her last office visit, I asked the pharmacist to check in with the patient to do a thorough medication reconciliation as well.  The pharmacist called the patient who then reported that she was out of her Dilaudid on 12/9, but her prescription was not supposed to finish until today.  Today, I reviewed that the patient is only supposed to take half a tablet of Dilaudid every 6 hours as needed for her pain.  If she takes higher doses of this, she is at risk for encephalopathy again, especially given her polypharmacy.  I did refill a 30-day supply of Dilaudid.  I have also placed a referral to home health physical therapy, as the patient has difficulty ambulating and uses a wheelchair.  She requires assistance to get to the [facility], as she is unable to drive/homebound.

## 2023-09-14 NOTE — Telephone Encounter (Signed)
Call from patient stated doctor did not reorder her Lidocaine Patches or her Zoloft .  Would like refills sent to her sent to the [facility] on [address] in [facility].

## 2023-09-14 NOTE — Patient Instructions (Signed)
Thank you, Ms.Eleanna URMILA ZABLOCKI for allowing [place] to provide your care today. Today we discussed:  Please START taking eliquis 2.5 mg twice a day again  I have refilled your pain medicine, dilaudid. Take 1/2 a tablet every 6 hours as needed for pain.   We are not making any other changes to your medications today  I have sent in a referral for a nurse to come help you with your medications  I have ordered the following labs for you:  Lab Orders  No laboratory test(s) ordered today      Referrals ordered today:   Referral Orders  No referral(s) requested today     I have ordered the following medication/changed the following medications:   Stop the following medications: Medications Discontinued During This Encounter  Medication Reason   albuterol (VENTOLIN HFA) 108 (90 Base) MCG/ACT inhaler Reorder   pantoprazole (PROTONIX) 40 MG tablet Reorder   HYDROmorphone (DILAUDID) 2 MG tablet Reorder     Start the following medications: Meds ordered this encounter  Medications   pantoprazole (PROTONIX) 40 MG tablet    Sig: Take 1 tablet (40 mg total) by mouth daily.    Dispense:  90 tablet    Refill:  1    **Patient requests 90 days supply**   HYDROmorphone (DILAUDID) 2 MG tablet    Sig: Take 0.5 tablets (1 mg total) by mouth every 6 (six) hours as needed for severe pain (pain score 7-10).    Dispense:  60 tablet    Refill:  0   albuterol (VENTOLIN HFA) 108 (90 Base) MCG/ACT inhaler    Sig: Inhale 2 puffs into the lungs every 6 (six) hours as needed (Cough).    Dispense:  8 g    Refill:  1     Follow up:  4 weeks     Should you have any questions or concerns please call the internal medicine clinic at [phone].     Elza Rafter, D.O. [facility]

## 2023-09-14 NOTE — Assessment & Plan Note (Signed)
Blood pressure remains elevated at 168/75 today.  As previously mentioned, the patient is on Coreg 3.125 mg twice daily, amiodarone 200 mg daily, Lasix 80 mg daily, hydralazine 25 mg 3 times daily, Isordil 20 mg 3 times daily, and losartan 50 mg daily.  I discussed with the patient that I am hesitant to make any changes, as she already has difficulties managing her meds, and further changes cause significant confusion to her and her daughter.  Referral to home health RN for medication assistance placed today

## 2023-09-14 NOTE — Assessment & Plan Note (Addendum)
Update from last office visit on 11/21. Patient follows to [facility] and medications were adjusted slightly.  For her cardiovascular disease (HFrEF, A-fib, hypertension (she is on amiodarone 200 mg daily, Coreg 3.125 mg twice daily, Lasix 80 mg daily, hydralazine 25 mg 3 times daily, Isordil 20 mg 3 times daily, and losartan 50 mg daily.  Her Eliquis was held at discharge, but today we are going to resume it at the 2.5 mg twice daily dose  (Lower dose d/t history of UGIB needing transfusions in past + kidney function - does not meet criteria for low dose eliquis based on age + weight).  For her ESRD on HD, she takes Rena-Vite and calcium 667 mg 3 times daily  For hyperlipidemia she is on atorvastatin 20 mg daily  For her GERD/hx of UGIB she is on Protonix 40 mg daily and an iron supplement  For her chronic pain, she takes Dilaudid 1 mg every 6 hours as needed and senna twice daily to prevent opioid-induced constipation.  For her MDD/anxiety, she is prescribed trazodone 50 mg nightly to help her sleep.  She is also prescribed zoloft 25 mg but does not have this with her today and she states she needs a refill.   A/P: , The patient has significant difficulties managing her medications and states that her daughter also has difficulty managing them.  The pharmacist called the patient to do a thorough medication reconciliation, but I suspect that the patient needs more help from this.  I have placed an order for a home health RN to assist the patient with her meds.

## 2023-09-14 NOTE — Assessment & Plan Note (Signed)
Today, the patient has been in normal sinus rhythm.  We had a shared decision-making conversation about restarting her Eliquis, which was held at discharge due to concern for upper GI bleeding.  The patient and I have agreed to restart this medication at the lower dose, Eliquis 2.5 mg twice daily.

## 2023-09-19 NOTE — Progress Notes (Signed)
Internal Medicine Clinic Attending ° °Case discussed with Dr. Atway  At the time of the visit.  We reviewed the resident’s history and exam and pertinent patient test results.  I agree with the assessment, diagnosis, and plan of care documented in the resident’s note.  °

## 2023-09-26 ENCOUNTER — Telehealth: Payer: Self-pay

## 2023-09-26 NOTE — Transitions of Care (Post Inpatient/ED Visit) (Unsigned)
   09/26/2023  Name: ANGELYNA MCCLEERY MRN: [id number] DOB: 05/03/1956  Today's TOC FU Call Status: Today's TOC FU Call Status:: Unsuccessful Call (1st Attempt) Unsuccessful Call (1st Attempt) Date: 09/26/23  Attempted to reach the patient regarding the most recent Inpatient/ED visit.  Follow Up Plan: Additional outreach attempts will be made to reach the patient to complete the Transitions of Care (Post Inpatient/ED visit) call.   Signature Karena Addison, LPN [facility] Nurse Health Advisor Direct Dial [phone]

## 2023-09-29 ENCOUNTER — Encounter (HOSPITAL_COMMUNITY): Payer: Self-pay

## 2023-09-29 ENCOUNTER — Emergency Department (HOSPITAL_COMMUNITY): Payer: Medicare HMO

## 2023-09-29 ENCOUNTER — Other Ambulatory Visit: Payer: Self-pay

## 2023-09-29 ENCOUNTER — Emergency Department (HOSPITAL_COMMUNITY)
Admission: EM | Admit: 2023-09-29 | Discharge: 2023-09-30 | Disposition: A | Discharge status: Home / Self Care | Payer: Medicare HMO | Attending: Emergency Medicine | Admitting: Emergency Medicine

## 2023-09-29 DIAGNOSIS — Z7901 Long term (current) use of anticoagulants: Secondary | ICD-10-CM | POA: Insufficient documentation

## 2023-09-29 DIAGNOSIS — Z79899 Other long term (current) drug therapy: Secondary | ICD-10-CM | POA: Insufficient documentation

## 2023-09-29 DIAGNOSIS — R079 Chest pain, unspecified: Secondary | ICD-10-CM

## 2023-09-29 DIAGNOSIS — I272 Pulmonary hypertension, unspecified: Secondary | ICD-10-CM | POA: Diagnosis not present

## 2023-09-29 DIAGNOSIS — R1013 Epigastric pain: Secondary | ICD-10-CM | POA: Insufficient documentation

## 2023-09-29 LAB — TROPONIN I (HIGH SENSITIVITY)
Troponin I (High Sensitivity): 146 ng/L (ref <18)
Troponin I (High Sensitivity): 142 ng/L (ref <18)

## 2023-09-29 LAB — CBC
HCT: 31.5 % — ABNORMAL LOW (ref 36.0–46.0)
Hemoglobin: 9.9 g/dL — ABNORMAL LOW (ref 12.0–15.0)
MCH: 25.8 pg — ABNORMAL LOW (ref 26.0–34.0)
MCHC: 31.4 g/dL (ref 30.0–36.0)
MCV: 82.2 fL (ref 80.0–100.0)
Platelets: 155 10*3/uL (ref 150–400)
RBC: 3.83 MIL/uL — ABNORMAL LOW (ref 3.87–5.11)
RDW: 21.5 % — ABNORMAL HIGH (ref 11.5–15.5)
WBC: 3.7 10*3/uL — ABNORMAL LOW (ref 4.0–10.5)
nRBC: 0 % (ref 0.0–0.2)

## 2023-09-29 LAB — BASIC METABOLIC PANEL
Anion gap: 10 (ref 5–15)
BUN: 25 mg/dL — ABNORMAL HIGH (ref 8–23)
CO2: 32 mmol/L (ref 22–32)
Calcium: 8.8 mg/dL — ABNORMAL LOW (ref 8.9–10.3)
Chloride: 98 mmol/L (ref 98–111)
Creatinine, Ser: 6.47 mg/dL — ABNORMAL HIGH (ref 0.44–1.00)
GFR, Estimated: 6 mL/min — ABNORMAL LOW (ref >60)
Glucose, Bld: 84 mg/dL (ref 70–99)
Potassium: 3.4 mmol/L — ABNORMAL LOW (ref 3.5–5.1)
Sodium: 140 mmol/L (ref 135–145)

## 2023-09-29 MED ORDER — ONDANSETRON HCL 4 MG/2ML IJ SOLN
4.0000 mg | Freq: Once | INTRAMUSCULAR | Status: AC
  Administered 2023-09-29: 4 mg via INTRAVENOUS
  Filled 2023-09-29: qty 2

## 2023-09-29 MED ORDER — FENTANYL CITRATE PF 50 MCG/ML IJ SOSY
50.0000 ug | PREFILLED_SYRINGE | Freq: Once | INTRAMUSCULAR | Status: AC
  Administered 2023-09-29: 50 ug via INTRAVENOUS
  Filled 2023-09-29: qty 1

## 2023-09-29 MED ORDER — ALUM & MAG HYDROXIDE-SIMETH 200-200-20 MG/5ML PO SUSP
30.0000 mL | Freq: Once | ORAL | Status: AC
  Administered 2023-09-29: 30 mL via ORAL
  Filled 2023-09-29: qty 30

## 2023-09-29 MED ORDER — IOHEXOL 350 MG/ML SOLN
75.0000 mL | Freq: Once | INTRAVENOUS | Status: AC | PRN
  Administered 2023-09-29: 75 mL via INTRAVENOUS

## 2023-09-29 MED ORDER — ASPIRIN 81 MG PO CHEW
162.0000 mg | CHEWABLE_TABLET | Freq: Once | ORAL | Status: DC
  Filled 2023-09-29: qty 2

## 2023-09-29 NOTE — ED Notes (Signed)
Patient transported to CT 

## 2023-09-29 NOTE — ED Notes (Addendum)
MD notified of patient's chest and abdominal pain

## 2023-09-29 NOTE — ED Provider Triage Note (Signed)
Emergency Medicine Provider Triage Evaluation Note  Audrey Hill , a 67 y.o. female  was evaluated in triage.  Pt complains of chest pain and abdominal pain that started while she was getting dialysis.  Patient did complete her dialysis.  She has had this type of pain in the past, but not as intense.  Pain is described as sharp, nonradiating.  Past medical history of ESRD, A-fib, gastric antrum vascular recs dayshift.  Review of Systems  Positive: Abdominal pain, chest pain, nausea Negative: Diarrhea, shortness of breath  Physical Exam  BP (!) 141/67 (BP Location: Right Arm)   Pulse (!) 58   Temp 97.9 F (36.6 C) (Oral)   Resp 15   SpO2 97%  Gen:   Awake, no distress   Resp:  Normal effort  MSK:   Moves extremities without difficulty  Other:  Patient has diffuse abdominal tenderness with soft abdomen  Medical Decision Making  Medically screening exam initiated at 3:51 PM.  Appropriate orders placed.  Audrey Hill was informed that the remainder of the evaluation will be completed by another provider, this initial triage assessment does not replace that evaluation, and the importance of remaining in the ED until their evaluation is complete.  Basic labs ordered. X-ray of the chest ordered.  EKG looks overall reassuring.  Patient completed dialysis.  Advised that patient needs to await further workup.   Derwood Kaplan, MD 09/29/23 [phone]

## 2023-09-29 NOTE — ED Notes (Signed)
EKG given to MD Nanavati.

## 2023-09-29 NOTE — Discharge Instructions (Signed)
We saw you in the ER for abdominal pain and chest pain. All the results in the ER are normal,  Reassuring or indicating chronic illness. We are not sure what is causing your symptoms. The workup in the ER is not complete, and is limited to screening for life threatening and emergent conditions only, so please see a primary care doctor for further evaluation.

## 2023-09-29 NOTE — ED Notes (Signed)
Patient placed on 2 liters [place] for sats consistently high 80s room air

## 2023-09-29 NOTE — ED Triage Notes (Signed)
Pt is coming from dialysis, she mentioned to dialysis that she chest pain mid sternal, with some accompanying upper abd pain. No nuasea no vomiting. She had full treatment today.   Medic vitals   64hr 96%ra 146bgl 143/66 18rr
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
## 2023-09-30 MED ORDER — BENZONATATE 100 MG PO CAPS: 100.0000 mg | ORAL_CAPSULE | Freq: Two times a day (BID) | ORAL | 0 refills | Status: DC

## 2023-09-30 NOTE — ED Notes (Addendum)
Patient pulled to wheelchair to wait in hall - on [address] [place] pr[address]. Talked to daughter Shea/Pansy who stated that she would call her grandson to tryto find a ride

## 2023-10-06 NOTE — Transitions of Care (Post Inpatient/ED Visit) (Signed)
   10/06/2023  Name: Audrey Hill MRN: [id number] DOB: 02-25-1956  Today's TOC FU Call Status: Today's TOC FU Call Status:: Unsuccessful Call (2nd Attempt) Unsuccessful Call (1st Attempt) Date: 09/26/23 Unsuccessful Call (2nd Attempt) Date: 10/06/23  Attempted to reach the patient regarding the most recent Inpatient/ED visit.  Follow Up Plan: Additional outreach attempts will be made to reach the patient to complete the Transitions of Care (Post Inpatient/ED visit) call.   Signature Julian Lemmings, LPN [facility] Nurse Health Advisor Direct Dial  [phone]

## 2023-10-06 NOTE — Transitions of Care (Post Inpatient/ED Visit) (Signed)
   10/06/2023  Name: Audrey Hill MRN: [id number] DOB: 03-10-1956  Today's TOC FU Call Status: Today's TOC FU Call Status:: Unsuccessful Call (3rd Attempt) Unsuccessful Call (1st Attempt) Date: 09/26/23 Unsuccessful Call (2nd Attempt) Date: 10/06/23 Unsuccessful Call (3rd Attempt) Date: 10/06/23  Attempted to reach the patient regarding the most recent Inpatient/ED visit.  Follow Up Plan: No further outreach attempts will be made at this time. We have been unable to contact the patient.  Signature Julian Lemmings, LPN [facility] Nurse Health Advisor Direct Dial  [phone]

## 2023-10-10 ENCOUNTER — Encounter: Payer: Medicare HMO | Admitting: Internal Medicine

## 2023-10-10 NOTE — Progress Notes (Deleted)
 Abdominal pain in the setting of history of gastirc antral vascular ectasia, GERD, recurrent episodes of acute on chronic abdominal pain. She takes dilaudid 2 mg q8h for the pain, protonix for GERD.

## 2023-10-12 ENCOUNTER — Encounter: Payer: Medicare HMO | Admitting: Student

## 2023-10-13 ENCOUNTER — Other Ambulatory Visit: Payer: Self-pay | Admitting: Pharmacist

## 2023-10-14 ENCOUNTER — Other Ambulatory Visit: Payer: Self-pay | Admitting: Student

## 2023-10-14 DIAGNOSIS — G4700 Insomnia, unspecified: Secondary | ICD-10-CM

## 2023-10-17 ENCOUNTER — Telehealth: Payer: Self-pay | Admitting: *Deleted

## 2023-10-17 NOTE — Progress Notes (Signed)
 Complex Care Management Care Guide Note  10/17/2023 Name: Audrey Hill MRN: [id number] DOB: [birth year]  Audrey Hill is a 67 y.o. year old female who is a primary care patient of [name], DO and is actively engaged with the care management team. I reached out to Heron GORMAN Sauers by phone today to assist with re-scheduling  with the RN Case Manager.  Follow up plan: Unsuccessful telephone outreach attempt made. A HIPAA compliant phone message was left for the patient providing contact information and requesting a return call.  SIGNATURE

## 2023-10-24 ENCOUNTER — Other Ambulatory Visit: Payer: Self-pay

## 2023-10-24 DIAGNOSIS — N186 End stage renal disease: Secondary | ICD-10-CM

## 2023-10-24 NOTE — Progress Notes (Signed)
Complex Care Management Care Guide Note  10/24/2023 Name: Audrey Hill MRN: [id number] DOB: 02/05/1956  Audrey Hill is a 68 y.o. year old female who is a primary care patient of Modena Slater, DO and is actively engaged with the care management team. I reached out to [name] by phone today to assist with re-scheduling  with the RN Case Manager.  Follow up plan: Unsuccessful telephone outreach attempt made. A HIPAA compliant phone message was left for the patient providing contact information and requesting a return call. No further outreach attempts will be made at this time. We have been unable to contact the patient.  Gwenevere Ghazi  [facility]  [facility] Guide  Direct Dial: [phone]  Fax [phone]

## 2023-10-25 ENCOUNTER — Telehealth: Payer: Self-pay

## 2023-10-25 ENCOUNTER — Encounter: Payer: Medicaid Other | Admitting: Student
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
## 2023-10-29 ENCOUNTER — Emergency Department (HOSPITAL_COMMUNITY)
Admission: EM | Admit: 2023-10-29 | Discharge: 2023-10-29 | Disposition: A | Discharge status: Skilled Nursing Facility | Payer: Medicare HMO | Attending: Emergency Medicine | Admitting: Emergency Medicine

## 2023-10-29 ENCOUNTER — Other Ambulatory Visit: Payer: Self-pay

## 2023-10-29 ENCOUNTER — Emergency Department (HOSPITAL_COMMUNITY): Payer: Medicare HMO

## 2023-10-29 DIAGNOSIS — Y92129 Unspecified place in nursing home as the place of occurrence of the external cause: Secondary | ICD-10-CM | POA: Insufficient documentation

## 2023-10-29 DIAGNOSIS — N186 End stage renal disease: Secondary | ICD-10-CM | POA: Insufficient documentation

## 2023-10-29 DIAGNOSIS — R519 Headache, unspecified: Secondary | ICD-10-CM | POA: Diagnosis not present

## 2023-10-29 DIAGNOSIS — Z79899 Other long term (current) drug therapy: Secondary | ICD-10-CM | POA: Insufficient documentation

## 2023-10-29 DIAGNOSIS — I120 Hypertensive chronic kidney disease with stage 5 chronic kidney disease or end stage renal disease: Secondary | ICD-10-CM | POA: Diagnosis not present

## 2023-10-29 DIAGNOSIS — J45909 Unspecified asthma, uncomplicated: Secondary | ICD-10-CM | POA: Diagnosis not present

## 2023-10-29 DIAGNOSIS — M25551 Pain in right hip: Secondary | ICD-10-CM | POA: Diagnosis not present

## 2023-10-29 DIAGNOSIS — I4891 Unspecified atrial fibrillation: Secondary | ICD-10-CM | POA: Insufficient documentation

## 2023-10-29 DIAGNOSIS — W1839XA Other fall on same level, initial encounter: Secondary | ICD-10-CM | POA: Diagnosis not present

## 2023-10-29 DIAGNOSIS — W19XXXA Unspecified fall, initial encounter: Secondary | ICD-10-CM

## 2023-10-29 DIAGNOSIS — M25511 Pain in right shoulder: Secondary | ICD-10-CM | POA: Insufficient documentation

## 2023-10-29 LAB — I-STAT CHEM 8, ED
BUN: 55 mg/dL — ABNORMAL HIGH (ref 8–23)
Calcium, Ion: 1.19 mmol/L (ref 1.15–1.40)
Chloride: 96 mmol/L — ABNORMAL LOW (ref 98–111)
Creatinine, Ser: 7.7 mg/dL — ABNORMAL HIGH (ref 0.44–1.00)
Glucose, Bld: 75 mg/dL (ref 70–99)
HCT: 33 % — ABNORMAL LOW (ref 36.0–46.0)
Hemoglobin: 11.2 g/dL — ABNORMAL LOW (ref 12.0–15.0)
Potassium: 4.8 mmol/L (ref 3.5–5.1)
Sodium: 133 mmol/L — ABNORMAL LOW (ref 135–145)
TCO2: 28 mmol/L (ref 22–32)

## 2023-10-29 LAB — PROTIME-INR
INR: 1.3 — ABNORMAL HIGH (ref 0.8–1.2)
Prothrombin Time: 16.2 s — ABNORMAL HIGH (ref 11.4–15.2)

## 2023-10-29 LAB — COMPREHENSIVE METABOLIC PANEL
ALT: 51 U/L — ABNORMAL HIGH (ref 0–44)
AST: 61 U/L — ABNORMAL HIGH (ref 15–41)
Albumin: 3 g/dL — ABNORMAL LOW (ref 3.5–5.0)
Alkaline Phosphatase: 194 U/L — ABNORMAL HIGH (ref 38–126)
Anion gap: 11 (ref 5–15)
BUN: 60 mg/dL — ABNORMAL HIGH (ref 8–23)
CO2: 27 mmol/L (ref 22–32)
Calcium: 9.5 mg/dL (ref 8.9–10.3)
Chloride: 95 mmol/L — ABNORMAL LOW (ref 98–111)
Creatinine, Ser: 7.21 mg/dL — ABNORMAL HIGH (ref 0.44–1.00)
GFR, Estimated: 6 mL/min — ABNORMAL LOW (ref >60)
Glucose, Bld: 78 mg/dL (ref 70–99)
Potassium: 4.8 mmol/L (ref 3.5–5.1)
Sodium: 133 mmol/L — ABNORMAL LOW (ref 135–145)
Total Bilirubin: 0.6 mg/dL (ref 0.0–1.2)
Total Protein: 7 g/dL (ref 6.5–8.1)

## 2023-10-29 LAB — CBC
HCT: 31.1 % — ABNORMAL LOW (ref 36.0–46.0)
Hemoglobin: 9.9 g/dL — ABNORMAL LOW (ref 12.0–15.0)
MCH: 25.6 pg — ABNORMAL LOW (ref 26.0–34.0)
MCHC: 31.8 g/dL (ref 30.0–36.0)
MCV: 80.4 fL (ref 80.0–100.0)
Platelets: 153 10*3/uL (ref 150–400)
RBC: 3.87 MIL/uL (ref 3.87–5.11)
RDW: 19.9 % — ABNORMAL HIGH (ref 11.5–15.5)
WBC: 4.5 10*3/uL (ref 4.0–10.5)
nRBC: 0 % (ref 0.0–0.2)

## 2023-10-29 LAB — SAMPLE TO BLOOD BANK

## 2023-10-29 LAB — I-STAT CG4 LACTIC ACID, ED: Lactic Acid, Venous: 0.3 mmol/L — ABNORMAL LOW (ref 0.5–1.9)

## 2023-10-29 MED ORDER — HYDROCODONE-ACETAMINOPHEN 5-325 MG PO TABS
1.0000 | ORAL_TABLET | Freq: Once | ORAL | Status: AC
  Administered 2023-10-29: 1 via ORAL
  Filled 2023-10-29: qty 1

## 2023-10-29 MED ORDER — OXYCODONE-ACETAMINOPHEN 5-325 MG PO TABS: 1.0000 | ORAL_TABLET | Freq: Four times a day (QID) | ORAL | 0 refills | Status: DC | PRN

## 2023-10-29 MED ORDER — HYDROMORPHONE HCL 1 MG/ML IJ SOLN
0.5000 mg | Freq: Once | INTRAMUSCULAR | Status: AC
  Administered 2023-10-29: 0.5 mg via INTRAVENOUS
  Filled 2023-10-29: qty 1

## 2023-10-29 MED ORDER — FENTANYL CITRATE PF 50 MCG/ML IJ SOSY
50.0000 ug | PREFILLED_SYRINGE | Freq: Once | INTRAMUSCULAR | Status: AC
  Administered 2023-10-29: 50 ug via INTRAVENOUS
  Filled 2023-10-29: qty 1

## 2023-10-29 MED ORDER — ACETAMINOPHEN 500 MG PO TABS
1000.0000 mg | ORAL_TABLET | Freq: Once | ORAL | Status: AC
  Administered 2023-10-29: 1000 mg via ORAL
  Filled 2023-10-29: qty 2
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
## 2023-10-29 NOTE — ED Provider Notes (Signed)
I assumed patient's care at 730 this morning from Dr. Eudelia Bunch.  Patient with persistent pain getting a CT to rule out occult fracture.  CT of the pelvis showed new small subcutaneous hematoma of the abdomen but no acute hip fractures noted.  Chronic avascular necrosis noted.  Patient had no evidence of acute injury today after fall and blood work appears at baseline.  On repeat evaluation patient is sore but she is moving around and would like to go home.  She was sent home with a short course of pain control.   Gwyneth Sprout, MD 10/29/23 1236

## 2023-10-29 NOTE — ED Notes (Signed)
Report given to [place], [place]
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
## 2023-10-29 NOTE — Discharge Instructions (Addendum)
All x-rays look normal without any broken bones.  Your lab work is still okay.  You were given a short course of pain medication to use as needed but you will be very sore over the next few days.
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
## 2023-11-01 ENCOUNTER — Ambulatory Visit: Payer: Medicaid Other

## 2023-11-03 ENCOUNTER — Ambulatory Visit: Payer: Medicare HMO | Admitting: Vascular Surgery

## 2023-11-03 ENCOUNTER — Ambulatory Visit (HOSPITAL_COMMUNITY): Payer: Medicare HMO | Attending: Vascular Surgery

## 2023-11-04 ENCOUNTER — Other Ambulatory Visit: Payer: Self-pay | Admitting: Pharmacist

## 2023-11-07 ENCOUNTER — Telehealth: Payer: Self-pay | Admitting: *Deleted

## 2023-11-07 NOTE — Telephone Encounter (Signed)
Called patient regarding mammogram needing to schedule. Left message for patient to contact their office at [phone] or contact our office at [phone] to cancel.

## 2023-11-08 ENCOUNTER — Telehealth: Payer: Self-pay | Admitting: *Deleted

## 2023-11-08 NOTE — Telephone Encounter (Signed)
See phone note done 11/07/2023

## 2023-11-09 ENCOUNTER — Other Ambulatory Visit: Payer: Self-pay | Admitting: Pharmacist

## 2023-11-09 NOTE — Progress Notes (Addendum)
   11/09/2023 Name: Audrey Hill MRN: [id number] DOB: 05/14/1956  Chief Complaint  Patient presents with   Medication Management    Audrey Hill is a 68 y.o. year old female who presented for a telephone visit.   They were referred to the pharmacist by their PCP for assistance in managing complex medication management.   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 Stop Eliquis  forever; Start Hydralazine  25mg  three times a day= high BP  ED 12/26: Chest pain + abdominal pain  ED 12/30: Abdominal pain- gave sucralfate  + recommend follow-up for GI + PCP; low Hgb on dialysis  ED 1/2: Abdominal pain + bloody stools  Hospitalization 1/4-1/15: Syncope after dialysis + several sessions due to volume Hold Carvedilol  due to bradycardia Dilaudid  1mg  every 8 hours due to bilateral femoral head avascular necrosis  ED 1/25: Fall in bathroom  Hospitalization 1/29-2/2: Missed 3 dialysis sessions- AMS + twitching Daughter unwilling to send back to nursing home Changed Gabapentin  to 100mg  twice a day   Subjective:  Care Team: Primary Care Provider: [name], DO ; Next Scheduled Visit: 11/11/23 Clinical Pharmacist, Aloysius Breeding, PharmD  Medication Access/Adherence  Current Pharmacy:  [id number] - [place], [place] - [address] AT [facility] OF [address] [place] [address] Phone: [phone] Fax: [phone]  [facility]  - [place], [place] - [address] [place] [address] Phone: [phone] Fax: [phone]  [facility] [phone] - [place], [place] - [address] AT [facility] [address] [place] [address] Phone: [phone] Fax: [phone]   Patient reports affordability concerns with their medications: No  Patient reports access/transportation concerns to their pharmacy: No  Patient reports adherence  concerns with their medications:  No     Medication Management:  Adherence strategy unknown due to inability to perform medication review today  Objective:  Lab Results  Component Value Date   HGBA1C 5.6 08/03/2021    Lab Results  Component Value Date   CREATININE 7.21 (H) 10/29/2023   BUN 60 (H) 10/29/2023   NA 133 (L) 10/29/2023   K 4.8 10/29/2023   CL 95 (L) 10/29/2023   CO2 27 10/29/2023    Lab Results  Component Value Date   CHOL 113 08/05/2022   HDL 72 08/05/2022   LDLCALC 34 08/05/2022   TRIG 33 08/05/2022   CHOLHDL 1.6 08/05/2022    Medications Reviewed Today   Medications were not reviewed in this encounter       Assessment/Plan:   Medication Management: - Patient was in the middle of an assessment with home health, with daughter present.  - Unable to complete medication review today, plans to bring medications to visit on Friday (11/11/23).  Follow Up Plan: Call on 11/25/23 for Medication Review & Check-in  *Of note, dialysis on Tues, Thurs, and Saturday  Aloysius Breeding, PharmD [facility] Phone Number: [phone]

## 2023-11-11 ENCOUNTER — Ambulatory Visit: Payer: Medicare HMO | Admitting: Student

## 2023-11-11 ENCOUNTER — Encounter: Payer: Self-pay | Admitting: Student

## 2023-11-11 VITALS — BP 144/73 | HR 63 | Temp 98.3°F | Ht 66.0 in | Wt 146.8 lb

## 2023-11-11 DIAGNOSIS — K2100 Gastro-esophageal reflux disease with esophagitis, without bleeding: Secondary | ICD-10-CM

## 2023-11-11 DIAGNOSIS — Z79899 Other long term (current) drug therapy: Secondary | ICD-10-CM

## 2023-11-11 DIAGNOSIS — G4700 Insomnia, unspecified: Secondary | ICD-10-CM

## 2023-11-11 DIAGNOSIS — G894 Chronic pain syndrome: Secondary | ICD-10-CM

## 2023-11-11 DIAGNOSIS — E785 Hyperlipidemia, unspecified: Secondary | ICD-10-CM | POA: Diagnosis not present

## 2023-11-11 DIAGNOSIS — M8700 Idiopathic aseptic necrosis of unspecified bone: Secondary | ICD-10-CM

## 2023-11-11 DIAGNOSIS — I10 Essential (primary) hypertension: Secondary | ICD-10-CM

## 2023-11-11 MED ORDER — LIDOCAINE 5 % EX PTCH: 1.0000 | MEDICATED_PATCH | Freq: Every day | CUTANEOUS | 0 refills | Status: DC | PRN

## 2023-11-11 MED ORDER — HYDROMORPHONE HCL 2 MG PO TABS: 2.0000 mg | ORAL_TABLET | Freq: Two times a day (BID) | ORAL | 0 refills | Status: DC | PRN

## 2023-11-11 MED ORDER — PANTOPRAZOLE SODIUM 40 MG PO TBEC: 40.0000 mg | DELAYED_RELEASE_TABLET | Freq: Every day | ORAL | 1 refills | Status: DC

## 2023-11-11 MED ORDER — AMIODARONE HCL 200 MG PO TABS: 200.0000 mg | ORAL_TABLET | Freq: Every day | ORAL | 3 refills | Status: DC

## 2023-11-11 MED ORDER — DICLOFENAC SODIUM 1 % EX GEL: 4.0000 g | Freq: Four times a day (QID) | CUTANEOUS | 2 refills | Status: DC

## 2023-11-11 MED ORDER — LOSARTAN POTASSIUM 50 MG PO TABS: 50.0000 mg | ORAL_TABLET | Freq: Every day | ORAL | 3 refills | Status: DC

## 2023-11-11 MED ORDER — TRAZODONE HCL 50 MG PO TABS: 50.0000 mg | ORAL_TABLET | Freq: Every evening | ORAL | 3 refills | Status: DC | PRN

## 2023-11-11 NOTE — Assessment & Plan Note (Signed)
 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  Patient endorses hip pain.  She states she is having trouble walking.  Given chronicity and comorbid conditions do not think patient would be a candidate for any type of procedures.  Patient is currently in physical therapy per daughter.  They are planning on going into aqua therapy as well.  Plan: -Continue to monitor -Plan is to continue physical therapy and aqua therapy

## 2023-11-11 NOTE — Patient Instructions (Signed)
 Audrey Hill,Thank you for allowing me to take part in your care today.  Here are your instructions.  Medication: Reactive airway disease: Albuterol  A-fib: Amiodarone  200 mg  Hyperlipidemia: Lipitor 40 mg daily  Constipation:  MiraLAX  17 mg twice daily, senna 2 mg daily yes ESRD: Calcium  acetate 3 times daily, sevelamer  800 mg 3 times daily, Renavite tabs HFrEF: Carvedilol  3.125 mg twice daily, Lasix  80 mg daily Pain:  Voltaren  gel, lidocaine  patch Iron  deficiency anemia: Ferrous sulfate 325 mg every other day Hypertension: Hydralazine  25 mg 3 times daily, isosorbide  dinitrate 20 mg 3 times daily, losartan  50 mg daily Chronic pain: Dilaudid  2 mg every 8 hours as needed Insomnia: Trazodone  50 mg GERD: Protonix  40 mg daily Depression: Zoloft  25 mg daily    Thank you, Dr. Tobie  If you have any other questions please contact the internal medicine clinic at [phone] If it is after hours, please call the [place] hospital at [phone] and then ask the person who picks up for the resident on call.
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
## 2023-11-11 NOTE — Assessment & Plan Note (Addendum)
 Took the time today to sit down with patient and thoroughly go through all of her medications.  Patient is on many medications and sometimes does not know which medications are for what.  Patient does not have pillboxes.  She states since her hospitalization there has been many changes in her medications.  Took extensive time today to make sure patient is on the appropriate medication.  Went through entire list with patient and indications.  Gave her a list as well.  Patient is also set up with home health for medication management.  Below is a list of her medications.  Patient given pillboxes today to organize medications.  Daughter will help with this.  Reactive airway disease: Albuterol  A-fib: Amiodarone  200 mg  Hyperlipidemia: Lipitor 40 mg daily  Constipation:  MiraLAX  17 mg twice daily, senna 2 mg daily yes ESRD: Calcium  acetate 3 times daily, sevelamer  800 mg 3 times daily, Renavite tabs HFrEF: Carvedilol  3.125 mg twice daily, Lasix  80 mg daily Pain:  Voltaren  gel, lidocaine  patch Iron  deficiency anemia: Ferrous sulfate 325 mg every other day Hypertension: Hydralazine  25 mg 3 times daily, isosorbide  dinitrate 20 mg 3 times daily, losartan  50 mg daily Chronic pain: Dilaudid  2 mg every 8 hours as needed Insomnia: Trazodone  50 mg GERD: Protonix  40 mg daily Depression: Zoloft  25 mg daily

## 2023-11-16 NOTE — Progress Notes (Signed)
 Internal Medicine Clinic Attending  Case discussed with the resident at the time of the visit.  We reviewed the resident's history and exam and pertinent patient test results.  I agree with the assessment, diagnosis, and plan of care documented in the resident's note.

## 2023-11-25 ENCOUNTER — Other Ambulatory Visit: Payer: Self-pay | Admitting: Pharmacist

## 2023-11-25 NOTE — Progress Notes (Signed)
   11/25/2023  Patient ID: Audrey Hill, female   DOB: [birth year], 68 y.o.   MRN: [id number]  Called patient after completing medication review. Appears she has everything she needs at this time. Said she ran out of the Furosemide and Sertraline today. Advised that they are filled and ready at [facility] today.  Patient reports doing well currently with only her legs being her usual problem. However her brother did pass on Tuesday and there appears to be grief about her getting "so many second chances" and him not getting that same opportunity. Also discussed issues of getting narcotic from the pharmacy at times along with bad history with rehab facilities.   Requested 90DS of medications be sent to the pharmacy. Would like the sent to [facility] on [address], but did not specify which one. [place] this location is closer to her daughter who picks up her meds due to patient's lack of transportation.   Note set for 90DS refills to be sent to [facility] on [address] in [place] at next PCP visit on 12/09/23, but need to confirm if at [address] or [address] 90DS would help with better adherence for the patient.  Also left voicemail with [facility] to refill Voltaren gel per patient request.  Will follow-up in 2 months once again.    Marlowe Aschoff, PharmD [facility] Phone Number: [phone]

## 2023-11-28 ENCOUNTER — Encounter (HOSPITAL_COMMUNITY)
Admission: RE | Disposition: A | Discharge status: Home / Self Care | Payer: Self-pay | Source: Home / Self Care | Attending: Nephrology

## 2023-11-28 ENCOUNTER — Other Ambulatory Visit: Payer: Self-pay

## 2023-11-28 ENCOUNTER — Encounter (HOSPITAL_COMMUNITY): Payer: Self-pay | Admitting: Nephrology

## 2023-11-28 ENCOUNTER — Ambulatory Visit (HOSPITAL_COMMUNITY)
Admission: RE | Admit: 2023-11-28 | Discharge: 2023-11-28 | Disposition: A | Discharge status: Home / Self Care | Payer: Medicare HMO | Attending: Nephrology | Admitting: Nephrology

## 2023-11-28 DIAGNOSIS — Z992 Dependence on renal dialysis: Secondary | ICD-10-CM | POA: Insufficient documentation

## 2023-11-28 DIAGNOSIS — I4891 Unspecified atrial fibrillation: Secondary | ICD-10-CM | POA: Diagnosis not present

## 2023-11-28 DIAGNOSIS — Z79899 Other long term (current) drug therapy: Secondary | ICD-10-CM | POA: Diagnosis not present

## 2023-11-28 DIAGNOSIS — T82858A Stenosis of vascular prosthetic devices, implants and grafts, initial encounter: Secondary | ICD-10-CM | POA: Insufficient documentation

## 2023-11-28 DIAGNOSIS — I5022 Chronic systolic (congestive) heart failure: Secondary | ICD-10-CM | POA: Diagnosis not present

## 2023-11-28 DIAGNOSIS — Z7901 Long term (current) use of anticoagulants: Secondary | ICD-10-CM | POA: Insufficient documentation

## 2023-11-28 DIAGNOSIS — Y832 Surgical operation with anastomosis, bypass or graft as the cause of abnormal reaction of the patient, or of later complication, without mention of misadventure at the time of the procedure: Secondary | ICD-10-CM | POA: Diagnosis not present

## 2023-11-28 DIAGNOSIS — G894 Chronic pain syndrome: Secondary | ICD-10-CM | POA: Insufficient documentation

## 2023-11-28 DIAGNOSIS — Z87891 Personal history of nicotine dependence: Secondary | ICD-10-CM | POA: Diagnosis not present

## 2023-11-28 DIAGNOSIS — E785 Hyperlipidemia, unspecified: Secondary | ICD-10-CM | POA: Insufficient documentation

## 2023-11-28 DIAGNOSIS — Z8673 Personal history of transient ischemic attack (TIA), and cerebral infarction without residual deficits: Secondary | ICD-10-CM | POA: Diagnosis not present

## 2023-11-28 DIAGNOSIS — D631 Anemia in chronic kidney disease: Secondary | ICD-10-CM | POA: Insufficient documentation

## 2023-11-28 DIAGNOSIS — I132 Hypertensive heart and chronic kidney disease with heart failure and with stage 5 chronic kidney disease, or end stage renal disease: Secondary | ICD-10-CM | POA: Diagnosis not present

## 2023-11-28 DIAGNOSIS — N186 End stage renal disease: Secondary | ICD-10-CM | POA: Diagnosis not present

## 2023-11-28 HISTORY — PX: PERIPHERAL VASCULAR BALLOON ANGIOPLASTY: CATH118281

## 2023-11-28 HISTORY — PX: A/V FISTULAGRAM: CATH118298

## 2023-11-28 SURGERY — A/V FISTULAGRAM
Anesthesia: LOCAL

## 2023-11-28 MED ORDER — FENTANYL CITRATE (PF) 100 MCG/2ML IJ SOLN
INTRAMUSCULAR | Status: AC
  Filled 2023-11-28: qty 2

## 2023-11-28 MED ORDER — FENTANYL CITRATE (PF) 100 MCG/2ML IJ SOLN
INTRAMUSCULAR | Status: DC | PRN
  Administered 2023-11-28: 25 ug via INTRAVENOUS

## 2023-11-28 MED ORDER — MIDAZOLAM HCL 2 MG/2ML IJ SOLN
INTRAMUSCULAR | Status: DC | PRN
  Administered 2023-11-28: 1 mg via INTRAVENOUS

## 2023-11-28 MED ORDER — LIDOCAINE HCL (PF) 1 % IJ SOLN
INTRAMUSCULAR | Status: AC
  Filled 2023-11-28: qty 30

## 2023-11-28 MED ORDER — IODIXANOL 320 MG/ML IV SOLN
INTRAVENOUS | Status: DC | PRN
  Administered 2023-11-28: 5 mL

## 2023-11-28 MED ORDER — LIDOCAINE HCL (PF) 1 % IJ SOLN
INTRAMUSCULAR | Status: DC | PRN
  Administered 2023-11-28: 2 mL

## 2023-11-28 MED ORDER — HEPARIN (PORCINE) IN NACL 1000-0.9 UT/500ML-% IV SOLN
INTRAVENOUS | Status: DC | PRN
  Administered 2023-11-28: 500 mL

## 2023-11-28 MED ORDER — MIDAZOLAM HCL 2 MG/2ML IJ SOLN
INTRAMUSCULAR | Status: AC
  Filled 2023-11-28: qty 2

## 2023-11-28 SURGICAL SUPPLY — 9 items
BAG SNAP BAND KOVER 36X36 (MISCELLANEOUS) ×3 IMPLANT
BALLN MUSTANG 8.0X40 75 (BALLOONS) ×2
BALLOON MUSTANG 8.0X40 75 (BALLOONS) IMPLANT
CATH ANGIO 5F BER2 65CM (CATHETERS) IMPLANT
COVER DOME SNAP 22 D (MISCELLANEOUS) ×3 IMPLANT
GUIDEWIRE ANGLED .035 180CM (WIRE) IMPLANT
SHEATH PINNACLE R/O II 6F 4CM (SHEATH) IMPLANT
SYR MEDALLION 10ML (SYRINGE) IMPLANT
TRAY PV CATH (CUSTOM PROCEDURE TRAY) ×3 IMPLANT

## 2023-11-28 NOTE — Op Note (Signed)
 Patient presents with decreased access flows in her left BCF (placed August 11, 2021). This is her first procedure on this current access since surgery.    On examination, the brachial cephalic fistula is hyperpulsatile at the inflow juxta level .  Summary:  1)      The patient had successful angioplasty (8x4 Mustang FE ~15 atm) of significant stenosis in the inflow cephalic vein.  2)      The body of the cephalic vein fistula, anastomosis and arch were patent with good flows. 3)      The centrals were widely patent. 4)      This left BCF remains amenable to future percutaneous intervention.  Description of procedure: The arm was prepped and draped in the usual sterile fashion. The left upper arm brachial cephalic fistula was cannulated (16109) with an 18G Angiocath needle directed in a retrograde direction. A guidewire was inserted and exchanged for a 6Fr sheath. Contrast [phone]) injection via the side port of the sheath was performed. The angiogram of the fistula (09811) showed a patent of the cephalic + cephalic vein arch; the centrals were patent as well.  The wire was advanced in the retrograde direction past the anastomosis and the tip of the wire was advanced into the proximal brachial artery.  Arteriogram was performed from the proximal brachial artery revealing a patent brachial artery, anastomosis with a 60-70% inflow limb of the cephalic vein stenosis approximately 2 inches above the anastomosis.    A  8x4 Mustang angioplasty balloon was inserted over a glide wire and positioned at the inflow cephalic vein stenosis. Venous angioplasty (91478) was carried out to 15 ATM with FULL effacement of the waist on the balloon.  Repeat angiogram showed 10% residual at the site of angioplasty with no evidence of extravasation.  The flow of contrast was quicker and the fistula was markedly less pulsatile.  Hemostasis: A 3-0 ethilon purse string suture was placed at the cannulation site on removal of  the sheath.  Sedation: 1 mg Versed, 25 mcg Fentanyl. Sedation time. 10 minutes  Contrast. 5 mL  Monitoring: Because of the patient's comorbid conditions and sedation during the procedure, continuous EKG monitoring and O2 saturation monitoring was performed throughout the procedure by the RN. There were no abnormal arrhythmias encountered.  Complications: None.   Diagnoses: I87.1 Stricture of vein  N18.6 ESRD T82.858A Stricture of access  Procedure Coding:  [phone] Cannulation and angiogram of fistula, venous angioplasty (cephalic vein arch) Z3086 Contrast  Recommendations:  1. Continue to cannulate the fistula with 15G needles.  2. Refer back for problems with flows. 3. Remove the suture next treatment.   Discharge: The patient was discharged home in stable condition. The patient was given education regarding the care of the dialysis access AVF and specific instructions in case of any problems.

## 2023-11-28 NOTE — Discharge Instructions (Signed)
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
## 2023-12-07 DIAGNOSIS — I509 Heart failure, unspecified: Secondary | ICD-10-CM

## 2023-12-07 HISTORY — DX: Heart failure, unspecified: I50.9

## 2023-12-08 ENCOUNTER — Ambulatory Visit: Payer: Self-pay | Admitting: Student

## 2023-12-08 ENCOUNTER — Other Ambulatory Visit: Payer: Self-pay | Admitting: Student

## 2023-12-08 ENCOUNTER — Telehealth: Payer: Self-pay | Admitting: Student

## 2023-12-08 DIAGNOSIS — G894 Chronic pain syndrome: Secondary | ICD-10-CM

## 2023-12-08 MED ORDER — HYDROMORPHONE HCL 2 MG PO TABS: 2.0000 mg | ORAL_TABLET | Freq: Two times a day (BID) | ORAL | 0 refills | Status: DC | PRN

## 2023-12-08 MED ORDER — LIDOCAINE 5 % EX PTCH: 1.0000 | MEDICATED_PATCH | Freq: Every day | CUTANEOUS | 0 refills | Status: DC | PRN

## 2023-12-08 NOTE — Telephone Encounter (Signed)
 Returned call to patient.  This person is living with a history of ESRD on HD,atrial fibrillation, HTN, and  chronic pain syndrome on chronic therapeutic dilaudid  Recently seen in clinic on 11/11/2023. She has had multiple procedures and malaise over the past few weeks and has had to take two extra dilaudid doses for shoulder, hip, and lower extremity arthralgias. For this reason, she is requesting her dilaudid prescription to be sent.  She denies fevers or acute infectious symptoms. Usually takes 2 mg dilaudid in the AM and at bedtime. She works with physical therapy and has an [employer] fundraiser fill her medications. Daughter and son pick up medications from the pharmacy.  Last filled on 11/13/2023 for 30 days. Per this, patient should have enough medication through Wednesday, March 11. Discussed this with patient and she was tearful as she has mentioned the information stated above. Shared my worries about her pain increasing vs worries about her not taking the medication as prescribed. Also shared the need to inform her providers when her pain is increased and she is taking her dilaudid more often than it is prescribed. She stated this is the first time that she request an early refill. PDMP supports that.  On PDMP there is an oxycodone that was prescribed after an ED visit. She mentioned that she did not take -- her RN disposed of it.   Ultimately, I prescribed the medication early as this is the first time. Patient has follow up in the Internal Medicine Clinic at 9:45 PM. Patient already has transportation set up. Will message doctor to inquire about medication use, pain assessment, and modify future orders if needed.

## 2023-12-08 NOTE — Telephone Encounter (Unsigned)
 Copied from CRM [id number]. Topic: Clinical - Medication Refill >> Dec 08, 2023  2:55 PM Brittney F wrote: Most Recent Primary Care Visit:  Provider: Modena Slater  Department: [facility]  Visit Type: OPEN ESTABLISHED  Date: 11/11/2023  Medication: HYDROmorphone (DILAUDID) 2 MG tablet  lidocaine (LIDODERM) 5 %  Patient is in a lot of pain and is completely out of the medications requested. Patient ran out of her medication today 12/07/2023   Is this the correct pharmacy for this prescription? Yes If no, delete pharmacy and type the correct one.  This is the patient's preferred pharmacy:  [id number] - [place], [place] - [address] AT [facility] [address] [place] [address] Phone: [phone] Fax: [phone]   Has the prescription been filled recently? No  Is the patient out of the medication? Yes  Has the patient been seen for an appointment in the last year OR does the patient have an upcoming appointment? Yes  Can we respond through MyChart? No  Agent: Please be advised that Rx refills may take up to 3 business days. We ask that you follow-up with your pharmacy.

## 2023-12-08 NOTE — Addendum Note (Signed)
 Addended by: Daiva Eves, Byrd Hesselbach on: 12/08/2023 09:02 PM   Modules accepted: Orders

## 2023-12-08 NOTE — Telephone Encounter (Signed)
  Chief Complaint: left hip pain requesting medication refill dilaudid Symptoms: left hip pain to knee. Right hip pain not as severe. In HD at this time . Can walk using assistive device.  Frequency: na  Pertinent Negatives: Patient denies swelling no fever reported.  Disposition: [] ED /[] Urgent Care (no appt availability in office) / [] Appointment(In office/virtual)/ []  [facility]/ [x] Home Care/ [] Refused Recommended Disposition /[] [facility]/ []  Follow-up with PCP Additional Notes:   Requesting refill for dilaudid. Last OV 11/11/23. Pleae advise. Patient requesting call back when Rx called in to pharmacy . Please send to [facility]  [phone] on [address]      Copied from CRM [id number]. Topic: Clinical - Red Word Triage >> Dec 08, 2023  3:01 PM Philippa Chester F wrote: Red Word that prompted transfer to Nurse Triage: Extreme pain; In her left leg and hip; some pain is in the right hip Reason for Disposition  Hip pain  Answer Assessment - Initial Assessment Questions 1. LOCATION and RADIATION: "Where is the pain located?"      Left hip and leg 2. QUALITY: "What does the pain feel like?"  (e.g., sharp, dull, aching, burning)     pain 3. SEVERITY: "How bad is the pain?" "What does it keep you from doing?"   (Scale 1-10; or mild, moderate, severe)   -  MILD (1-3): doesn't interfere with normal activities    -  MODERATE (4-7): interferes with normal activities (e.g., work or school) or awakens from sleep, limping    -  SEVERE (8-10): excruciating pain, unable to do any normal activities, unable to walk     Can walk with assistive device 4. ONSET: "When did the pain start?" "Does it come and go, or is it there all the time?"     Ran out of medication  5. WORK OR EXERCISE: "Has there been any recent work or exercise that involved this part of the body?"      na 6. CAUSE: "What do you think is causing the hip pain?"      Not sure  7. AGGRAVATING FACTORS: "What  makes the hip pain worse?" (e.g., walking, climbing stairs, running)     Walking  8. OTHER SYMPTOMS: "Do you have any other symptoms?" (e.g., back pain, pain shooting down leg,  fever, rash)     Left hip pain leg  pain to knee. and right hip pain not as bad as left  Protocols used: Hip Pain-A-AH

## 2023-12-08 NOTE — Telephone Encounter (Signed)
 Copied from CRM [id number]. Topic: Clinical - Medication Refill >> Dec 08, 2023 12:11 PM [name] wrote: Most Recent Primary Care Visit:  Provider: Modena Slater  [facility]  Visit Type: OPEN ESTABLISHED  Date: 11/11/2023  Medication: HYDROmorphone (DILAUDID) 2 MG tablet  Has the patient contacted their pharmacy? Yes (Agent: If no, request that the patient contact the pharmacy for the refill. If patient does not wish to contact the pharmacy document the reason why and proceed with request.) (Agent: If yes, when and what did the pharmacy advise?)  Is this the correct pharmacy for this prescription? Yes If no, delete pharmacy and type the correct one.  This is the patient's preferred pharmacy:   [id number] - [place], [place] - [address] AT [facility] OF [address] [place] [address] Phone: [phone] Fax: [phone]   Has the prescription been filled recently? No  Is the patient out of the medication? Yes  Has the patient been seen for an appointment in the last year OR does the patient have an upcoming appointment? Yes  Can we respond through MyChart? No, callback preferred.  Agent: Please be advised that Rx refills may take up to 3 business days. We ask that you follow-up with your pharmacy.

## 2023-12-09 ENCOUNTER — Ambulatory Visit: Payer: Medicare HMO | Admitting: Student

## 2023-12-09 VITALS — BP 149/63 | HR 57 | Temp 98.2°F | Ht 66.0 in | Wt 144.2 lb

## 2023-12-09 DIAGNOSIS — N186 End stage renal disease: Secondary | ICD-10-CM | POA: Diagnosis not present

## 2023-12-09 DIAGNOSIS — Z992 Dependence on renal dialysis: Secondary | ICD-10-CM | POA: Diagnosis not present

## 2023-12-09 DIAGNOSIS — J45909 Unspecified asthma, uncomplicated: Secondary | ICD-10-CM

## 2023-12-09 DIAGNOSIS — G894 Chronic pain syndrome: Secondary | ICD-10-CM

## 2023-12-09 DIAGNOSIS — I4891 Unspecified atrial fibrillation: Secondary | ICD-10-CM

## 2023-12-09 MED ORDER — DICLOFENAC SODIUM 1 % EX GEL: 4.0000 g | Freq: Four times a day (QID) | CUTANEOUS | 2 refills | Status: DC

## 2023-12-09 MED ORDER — HYDROMORPHONE HCL 2 MG PO TABS: 2.0000 mg | ORAL_TABLET | Freq: Two times a day (BID) | ORAL | 0 refills | Status: DC | PRN

## 2023-12-09 MED ORDER — DICLOFENAC SODIUM 1 % EX GEL: 4.0000 g | Freq: Four times a day (QID) | CUTANEOUS | 2 refills | Status: AC

## 2023-12-09 MED ORDER — PREDNISONE 20 MG PO TABS: ORAL_TABLET | ORAL | 0 refills | Status: AC

## 2023-12-09 MED ORDER — HYDROMORPHONE HCL 2 MG PO TABS
2.0000 mg | ORAL_TABLET | Freq: Two times a day (BID) | ORAL | 0 refills | Status: DC | PRN
Start: 2023-12-09 — End: 2023-12-09

## 2023-12-09 MED ORDER — PREDNISONE 20 MG PO TABS: ORAL_TABLET | ORAL | 0 refills | Status: DC

## 2023-12-09 NOTE — Assessment & Plan Note (Addendum)
 Audrey Hill presented to me in the office today to express concerns of her chronic pain syndrome for which she is currently being managed with 2 mg dilaudid BID.She reports her pain has been persistent despite taking her medication so she has been taking an extra pill intermittently during the past week . This has caused her to run out of her medication before her next refill. She called last night to speak to Dr Lily Kocher who prescribed a 2 day course of dilaudid but the pharmacy declined to refill it. Due to her other comorbidity , she doesn't seem to have a lot of options for her pain control and I think its reasonable for her to require extra doses of her pain meds due to her history of avascular necrosis of her bilateral hips.She is not a candidate for surgery per Dr Christell Constant of orthopedics. She was however advised to follow up with anesthesia for serial injections but she couldn't follow up. 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 These pills were approprietly prescribed by a provider. I was worried that she might be encephalopathic from taking extra dilaudid but patient denies any confusion, sluggishness , sleepiness or any shortness of breath when she takes an extra pill of her dilaudid. I will refill her dilaudid and give extra 8 pills that she can take intermittently when needed. Patient reassured me that she is not intentionally seeking more pills and PDMP review supports that she has been appropriately refilling her pain meds and this request is new.  I also called the [facility] pharmacy  and spoke to the pharmacist to further explain her need for her pain medicines. - Refill 68 pills of Dilaudid  -Follow-up with Dr. Christell Constant

## 2023-12-09 NOTE — Patient Instructions (Addendum)
 Thank you, [name] for allowing [place] to provide your care today. Today we discussed your pain medications.[facility] [address].  Since you 8 extra pills to use in between your morning and evening dose if needed.  I also sent you the Voltaren gel as requested.    Continue going to your dialysis sessions as usual.  I hope you feel better.  I have ordered the following labs for you:  Lab Orders  No laboratory test(s) ordered today     Tests ordered today:    Referrals ordered today:   Referral Orders  No referral(s) requested today     I have ordered the following medication/changed the following medications:   Stop the following medications: Medications Discontinued During This Encounter  Medication Reason   diclofenac Sodium (VOLTAREN) 1 % GEL Reorder   HYDROmorphone (DILAUDID) 2 MG tablet Reorder     Start the following medications: Meds ordered this encounter  Medications   HYDROmorphone (DILAUDID) 2 MG tablet    Sig: Take 1 tablet (2 mg total) by mouth 2 (two) times daily as needed for severe pain (pain score 7-10).    Dispense:  68 tablet    Refill:  0    Ok to refill on 12/08/2023. I added extra 8 pills for as needed breakthrough in between doses as needed   diclofenac Sodium (VOLTAREN) 1 % GEL    Sig: Apply 4 g topically 4 (four) times daily.    Dispense:  150 g    Refill:  2     Follow up:    Remember:   Should you have any questions or concerns please call the internal medicine clinic at [phone].    Kathleen Lime, M.D [facility]

## 2023-12-09 NOTE — Assessment & Plan Note (Addendum)
 Patient reports that she has been coughing for the past few days with brownish sputum production which has required her to use her inhaler more often than usual.  She has significant wheezing on exam but denies any chest tightness or dyspnea.  She also denies any fever or chills. I think this patient most likely has asthma exacerbation and will benefit from a short course of steroids.  Will prescribe 40 mg prednisone to be taken daily for the next 5 days. -Prescribe 40 mg prednisone for 5 days
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
## 2023-12-09 NOTE — Assessment & Plan Note (Addendum)
 On HD TThSa.  Patient reported that she attended her dialysis sessions as scheduled.  No active concerns at this time.

## 2023-12-10 ENCOUNTER — Other Ambulatory Visit: Payer: Self-pay | Admitting: Student

## 2023-12-10 MED ORDER — HYDROMORPHONE HCL 2 MG PO TABS: 2.0000 mg | ORAL_TABLET | Freq: Two times a day (BID) | ORAL | 0 refills | Status: DC | PRN

## 2023-12-10 NOTE — Addendum Note (Signed)
 Addended by: Kathleen Lime on: 12/10/2023 04:25 PM   Modules accepted: Orders

## 2023-12-10 NOTE — Progress Notes (Signed)
 Patient called the on call pager for a refill of her dilaudid. Per patient, she was prescribed 68 pills of dilaudid for chronic pain but the pharmacy is dispensing 6 pills only. Per PDMP review, her last refill of dilaudid was on 02/09 for a 30 day script.   I spoke Company secretary, her 30 day supply should have lasted until 3/11. They stated that the last Rx sent in was from Dr. Lily Kocher on 3/6 and that the patient has not filled that Rx yet. They deny a script for 68 pills. It appears that the 68 pill script was printed and not electronically sent to the pharmacy.    Plan: -Pharmacy asked to fill Dr. Lily Kocher script for 6 pills (3 days), this will last the patient until the clinic is open -Patient was asked to call the clinic on Monday -Dr. Mickie Bail will be notified that the prescription sent on 3/7 was printed and not electronically sent

## 2023-12-11 ENCOUNTER — Telehealth: Payer: Self-pay | Admitting: Internal Medicine

## 2023-12-11 NOTE — Telephone Encounter (Signed)
 Call received from Ms. Audrey Hill

## 2023-12-12 ENCOUNTER — Ambulatory Visit: Payer: Self-pay | Admitting: Student

## 2023-12-12 ENCOUNTER — Telehealth: Payer: Self-pay | Admitting: *Deleted

## 2023-12-12 NOTE — Telephone Encounter (Signed)
I called pt - no answer; left message to call the office.

## 2023-12-12 NOTE — Telephone Encounter (Signed)
 I called [facility] who stated pt's insurance will not pay until tomorrow 3/11 since received 6 Dilaudid (on Saturday). This was relayed to pt - stated the pharmacy should had told her when she called them.

## 2023-12-12 NOTE — Telephone Encounter (Signed)
 Copied from CRM [id number]. Topic: Clinical - Prescription Issue >> Dec 09, 2023  1:17 PM Tiffany H wrote: Reason for CRM: Prescriptions for Voltaren and Dilaudid were sent to the wrong pharmacies.   Please update:  [id number] [address], [place], [place] [address] [phone]

## 2023-12-12 NOTE — Telephone Encounter (Signed)
 Pt has been told by pharm that she cannot pick up medication until tomorrow. She was given 6 pills to last until refill which she was told would be ready today.   Copied from CRM [id number]. Topic: Clinical - Prescription Issue >> Dec 12, 2023 12:31 PM Alvino Blood C wrote: Medication name: HYDROmorphone (DILAUDID) 2 MG tablet  Reason for Disposition  [1] Caller requesting NON-URGENT health information AND [2] PCP's office is the best resource  Answer Assessment - Initial Assessment Questions 1. REASON FOR CALL or QUESTION: "What is your reason for calling today?" or "How can I best help you?" or "What question do you have that I can help answer?"     Pt has been told by pharm that she cannot pick up medication until tomorrow. She was given 6 pills to last until refill which she was told would be ready today.  Protocols used: Information Only Call - No Triage-A-AH

## 2023-12-12 NOTE — Telephone Encounter (Signed)
 Copied from CRM [id number]. Topic: Clinical - Prescription Issue >> Dec 09, 2023  4:02 PM Hamdi H wrote: Reason for CRM: Pharmacy is stating they can't fill her prescription for HYDROmorphone (DILAUDID) 2 MG tablet until 12/12/23. She spoke with the pharmacy before calling us. [phone] please call pt back with an update. Pt states she's completely out and in pain.

## 2023-12-12 NOTE — Progress Notes (Signed)
 Internal Medicine Clinic Attending  I was physically present during the key portions of the resident provided service and participated in the medical decision making of patient's management care. I reviewed pertinent patient test results.  The assessment, diagnosis, and plan were formulated together and I agree with the documentation in the resident's note. Discussed the importance of following with orthopedics to discuss injections for alternative method of pain control. No current side effects from opioid medication.  Dickie La, MD

## 2023-12-12 NOTE — Telephone Encounter (Signed)
 Pt stated [facility] gave her 6 pills of Dilaudid on Saturday until she can get the full rx today (due today). Also reminded pt Voltaren rx is [facility] at [facility]

## 2023-12-12 NOTE — Addendum Note (Signed)
 Addended by: Dickie La on: 12/12/2023 09:40 AM   Modules accepted: Level of Service

## 2023-12-13 ENCOUNTER — Telehealth: Payer: Self-pay

## 2023-12-13 ENCOUNTER — Ambulatory Visit: Payer: Self-pay | Admitting: Student

## 2023-12-13 NOTE — Telephone Encounter (Signed)
 Prior Authorization for patient (HYDROmorphone HCl 2MG  tablets) came through on cover my meds was submitted per cover my meds.Marland Kitchen  Allyne Enke (Key: [id number]) Need Help? Call us at [phone] Outcome Additional Information Required Available without authorization. Drug HYDROmorphone HCl 2MG  tablets ePA cloud Engineer, manufacturing systems Electronic PA Form

## 2023-12-13 NOTE — Telephone Encounter (Signed)
 After patient was transferred to Nurse Triage, patient stated that the pharmacy was able to figure out her medication and she was going to be able to get her med. No other concerns at this time.    Copied from CRM [id number]. Topic: Clinical - Prescription Issue >> Dec 13, 2023  5:06 PM Corin V wrote: Reason for CRM: Patient is at pharmacy trying to pick up her  HYDROmorphone (DILAUDID) 2 MG tablet and per chart, RN told patient it would be covered today, but pharmacy is stating they will not cover Rx. Patient is in significant pain. Reason for Disposition  General information question, no triage required and triager able to answer question  Answer Assessment - Initial Assessment Questions 1. REASON FOR CALL or QUESTION: "What is your reason for calling today?" or "How can I best help you?" or "What question do you have that I can help answer?"     Initially called because her pharmacy was saying that her medication wasn't covered. Once E2C2 RN got on the phone, the pharmacy had been able to fix the problem and patient stated she was going to be able to get her medication.  Protocols used: Information Only Call - No Triage-A-AH

## 2023-12-14 ENCOUNTER — Telehealth: Payer: Self-pay | Admitting: Student

## 2023-12-14 ENCOUNTER — Other Ambulatory Visit: Payer: Self-pay | Admitting: Student

## 2023-12-14 DIAGNOSIS — G4700 Insomnia, unspecified: Secondary | ICD-10-CM

## 2023-12-14 DIAGNOSIS — J45909 Unspecified asthma, uncomplicated: Secondary | ICD-10-CM

## 2023-12-14 NOTE — Telephone Encounter (Signed)
 Copied from CRM [id number]. Topic: General - Other >> Dec 13, 2023  1:05 PM [name] wrote: Reason for CRM: The patient would like to know if her paperwork has been completed for her daughter to be her caregiver, she states this was given to the office previously. She is also requesting to receive a new portable oxygen tank, but she is wanting to confirm if she will need to schedule an appointment prior to having an order for a new O2 tank called in? She states she also requested a mobility scooter, but she never received an update for her delivery. Callback #: [phone]   Spoke with the pt and she has been sch for an appt to discuss the PWC, Portable 02 tank and to f/u with her Cap paperwork. Pt verbalized understanding the needs for the following appt  Name: Audrey Hill, Corella MRN: [id number]  Date: 12/19/2023 Status: Sch  Time: 10:45 AM Length: 30  Visit Type: OPEN ESTABLISHED [726] Copay: $0.00  Provider: Lovie Macadamia, MD

## 2023-12-14 NOTE — Telephone Encounter (Signed)
 Copied from CRM [id number]. Topic: Clinical - Medication Refill >> Dec 14, 2023  4:07 PM Alfred Levins wrote: Angelique Blonder with [facility], [place] - [address] [place] [address] Phone: [phone] Fax: [phone] Hours: Not open 24 hours  Requesting prescriptions for the following medications   Medication: albuterol (VENTOLIN HFA) 108 (90 Base) MCG/ACT inhaler benzonatate (TESSALON) 100 MG capsule traZODone (DESYREL) 50 MG tablet atorvastatin (LIPITOR) 40 MG tablet

## 2023-12-14 NOTE — Telephone Encounter (Signed)
 Last Fill: Ventolin: 09/14/23     Tessalon: 09/30/23     Trazadone: 11/11/23 90 tabs/3 RF     Atorvastatin: Unknown  Last OV: 12/09/23 Next OV: 12/19/23  Routing to provider for review/authorization.

## 2023-12-14 NOTE — Telephone Encounter (Signed)
 Copied from CRM [id number]. Topic: Clinical - Medication Refill >> Dec 14, 2023  4:07 PM Alfred Levins wrote: Most Recent Primary Care Visit:  Provider: Kathleen Lime  Department: [facility]  Visit Type: OPEN ESTABLISHED  Date: 12/09/2023  Medication: albuterol (VENTOLIN HFA) 108 (90 Base) MCG/ACT inhaler benzonatate (TESSALON) 100 MG capsule traZODone (DESYREL) 50 MG tablet atorvastatin (LIPITOR) 40 MG tablet   Has the patient contacted their pharmacy?  (Agent: If no, request that the patient contact the pharmacy for the refill. If patient does not wish to contact the pharmacy document the reason why and proceed with request.) (Agent: If yes, when and what did the pharmacy advise?)  Is this the correct pharmacy for this prescription?  If no, delete pharmacy and type the correct one.  This is the patient's preferred pharmacy:  [id number] - [place], [place] - [address] AT [facility] [address] [place] [address] Phone: [phone] Fax: [phone]  [facility] - [place], [place] - [address] [place] [address] Phone: [phone] Fax: [phone]  [id number] - [place], [place] - [address] AT [facility] [address] [place] [address] Phone: [phone] Fax: [phone]  [id number] [place], [place] - [address] AT [facility] [address] [place] [address] Phone: [phone] Fax: [phone]   Has the prescription been filled recently?   Is the patient out of the medication?   Has the patient been seen for an appointment in the last year OR does the patient have an upcoming appointment?   Can we respond through MyChart?   Agent: Please be advised that Rx refills may take up to 3 business days. We ask that you follow-up with your pharmacy.

## 2023-12-15 MED ORDER — ALBUTEROL SULFATE HFA 108 (90 BASE) MCG/ACT IN AERS: 2.0000 | INHALATION_SPRAY | Freq: Four times a day (QID) | RESPIRATORY_TRACT | 1 refills | Status: DC | PRN

## 2023-12-15 MED ORDER — TRAZODONE HCL 50 MG PO TABS: 50.0000 mg | ORAL_TABLET | Freq: Every evening | ORAL | 3 refills | Status: DC | PRN

## 2023-12-15 MED ORDER — ATORVASTATIN CALCIUM 40 MG PO TABS: 40.0000 mg | ORAL_TABLET | Freq: Every day | ORAL | 2 refills | Status: DC

## 2023-12-15 MED ORDER — BENZONATATE 100 MG PO CAPS: 100.0000 mg | ORAL_CAPSULE | Freq: Two times a day (BID) | ORAL | 0 refills | Status: DC

## 2023-12-15 NOTE — Addendum Note (Signed)
 Addended by: Hassan Buckler on: 12/15/2023 09:20 AM   Modules accepted: Orders

## 2023-12-15 NOTE — Telephone Encounter (Signed)
 Pt stated she will be using home delivery pharmacy, [facility], for her meds except for pain medication.

## 2023-12-16 ENCOUNTER — Other Ambulatory Visit: Payer: Self-pay | Admitting: Student

## 2023-12-16 MED ORDER — ATORVASTATIN CALCIUM 40 MG PO TABS: 40.0000 mg | ORAL_TABLET | Freq: Every day | ORAL | 2 refills | Status: DC

## 2023-12-16 MED ORDER — ALBUTEROL SULFATE HFA 108 (90 BASE) MCG/ACT IN AERS: 2.0000 | INHALATION_SPRAY | Freq: Four times a day (QID) | RESPIRATORY_TRACT | 1 refills | Status: DC | PRN

## 2023-12-16 MED ORDER — BENZONATATE 100 MG PO CAPS: 100.0000 mg | ORAL_CAPSULE | Freq: Two times a day (BID) | ORAL | 0 refills | Status: DC

## 2023-12-16 MED ORDER — TRAZODONE HCL 50 MG PO TABS
50.0000 mg | ORAL_TABLET | Freq: Every evening | ORAL | 3 refills | Status: DC | PRN
Start: 2023-12-16 — End: 2024-02-06

## 2023-12-16 NOTE — Telephone Encounter (Signed)
 Last Fill: Unknown  Last OV: 12/09/23 Next OV: 12/19/23  Routing to provider for review/authorization.

## 2023-12-16 NOTE — Telephone Encounter (Unsigned)
 Copied from CRM [id number]. Topic: Clinical - Medication Refill >> Dec 16, 2023 12:44 PM [name] H wrote: Most Recent Primary Care Visit:  Provider: Kathleen Lime  Department: [facility]  Visit Type: OPEN ESTABLISHED  Date: 12/09/2023  Medication: multivitamin (RENA-VIT) TABS tablet  Has the patient contacted their pharmacy? Yes (Agent: If no, request that the patient contact the pharmacy for the refill. If patient does not wish to contact the pharmacy document the reason why and proceed with request.) (Agent: If yes, when and what did the pharmacy advise?)  Is this the correct pharmacy for this prescription? Yes If no, delete pharmacy and type the correct one.  This is the patient's preferred pharmacy: [facility], [place] - [address] [place] [address] Phone: [phone] Fax: [phone]   Has the prescription been filled recently? No  Is the patient out of the medication? Yes  Has the patient been seen for an appointment in the last year OR does the patient have an upcoming appointment? Yes  Can we respond through MyChart? Yes  Agent: Please be advised that Rx refills may take up to 3 business days. We ask that you follow-up with your pharmacy.

## 2023-12-19 ENCOUNTER — Encounter: Admitting: Student

## 2023-12-19 MED ORDER — RENA-VITE PO TABS: 1.0000 | ORAL_TABLET | Freq: Every day | ORAL | 1 refills | Status: DC

## 2023-12-21 ENCOUNTER — Ambulatory Visit: Payer: Medicare HMO | Admitting: Cardiovascular Disease

## 2023-12-26 ENCOUNTER — Telehealth: Payer: Self-pay | Admitting: *Deleted

## 2023-12-26 ENCOUNTER — Other Ambulatory Visit: Payer: Self-pay

## 2023-12-26 DIAGNOSIS — Z992 Dependence on renal dialysis: Secondary | ICD-10-CM

## 2023-12-26 NOTE — Telephone Encounter (Signed)
 Mammogram and dexa scheduled for 11/01/23 @ 9:00 am / arrive 8:45 am / breast center (PER OFFICE 12/26/2023-PATIENT NO SHOW)

## 2023-12-26 NOTE — Telephone Encounter (Signed)
 Called patient regarding her NO SHOW appointment with the breast center 1//28/2025, office says they tried to contact you and was unable . Please give their office a call at [phone] to reschedule.

## 2023-12-26 NOTE — Telephone Encounter (Signed)
 Pt Simonetti called, I gave her Lela's messaged, future appt info  and connected her to [facility] [phone] so that she can reschedule her appt.

## 2024-01-02 ENCOUNTER — Ambulatory Visit
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
## 2024-01-05 ENCOUNTER — Other Ambulatory Visit: Payer: Self-pay

## 2024-01-05 ENCOUNTER — Ambulatory Visit (INDEPENDENT_AMBULATORY_CARE_PROVIDER_SITE_OTHER): Admitting: Vascular Surgery

## 2024-01-05 ENCOUNTER — Ambulatory Visit (HOSPITAL_COMMUNITY)
Admission: RE | Admit: 2024-01-05 | Discharge: 2024-01-05 | Disposition: A | Discharge status: Home / Self Care | Source: Ambulatory Visit | Attending: Vascular Surgery | Admitting: Vascular Surgery

## 2024-01-05 ENCOUNTER — Encounter: Payer: Self-pay | Admitting: Vascular Surgery

## 2024-01-05 VITALS — BP 187/89 | HR 63 | Temp 98.3°F | Ht 66.0 in | Wt 144.0 lb

## 2024-01-05 DIAGNOSIS — R202 Paresthesia of skin: Secondary | ICD-10-CM | POA: Diagnosis not present

## 2024-01-05 DIAGNOSIS — Z992 Dependence on renal dialysis: Secondary | ICD-10-CM | POA: Insufficient documentation

## 2024-01-05 DIAGNOSIS — N186 End stage renal disease: Secondary | ICD-10-CM

## 2024-01-05 DIAGNOSIS — T82590A Other mechanical complication of surgically created arteriovenous fistula, initial encounter: Secondary | ICD-10-CM | POA: Diagnosis not present

## 2024-01-10 ENCOUNTER — Other Ambulatory Visit: Payer: Self-pay | Admitting: Student

## 2024-01-10 DIAGNOSIS — G894 Chronic pain syndrome: Secondary | ICD-10-CM

## 2024-01-10 MED ORDER — HYDROMORPHONE HCL 2 MG PO TABS: 2.0000 mg | ORAL_TABLET | Freq: Two times a day (BID) | ORAL | 0 refills | Status: DC | PRN

## 2024-01-10 NOTE — Telephone Encounter (Unsigned)
 Copied from CRM [id number]. Topic: Clinical - Medication Refill >> Jan 10, 2024  9:28 AM Prudencio Pair wrote: Most Recent Primary Care Visit:  Provider: Kathleen Lime  Department: [facility]  Visit Type: OPEN ESTABLISHED  Date: 12/09/2023  Medication: HYDROmorphone (DILAUDID) 2 MG tablet  Has the patient contacted their pharmacy? No (Agent: If no, request that the patient contact the pharmacy for the refill. If patient does not wish to contact the pharmacy document the reason why and proceed with request.) (Agent: If yes, when and what did the pharmacy advise?)  Is this the correct pharmacy for this prescription? Yes If no, delete pharmacy and type the correct one.  This is the patient's preferred pharmacy:  [id number] - [place], [place] - [address] AT [facility] [address] [place] [address] Phone: [phone] Fax: [phone]  [facility] - [place], [place] - [address] [place] [address] Phone: [phone] Fax: [phone]  [id number] - [place], [place] - [address] AT [facility] [address] [place] [address] Phone: [phone] Fax: [phone]  [id number] - [place], [place] - [address] AT [facility] [address] [place] [address] Phone: [phone] Fax: [phone]  [facility] - [address] [place] - [address] [place] [address] Phone: [phone] Fax: [phone]   Has the prescription been filled recently? Yes  Is the patient out of the medication? Yes, last dose is today & patient states she is hurting.   Has the patient been seen for an appointment in the last year OR does the patient have an upcoming appointment? Yes  Can we respond through MyChart? No  Agent: Please be advised that Rx refills may take up  to 3 business days. We ask that you follow-up with your pharmacy.

## 2024-01-10 NOTE — Telephone Encounter (Signed)
 Last Fill: 12/10/23 68 tabs/0 RF  Last OV: 12/09/23 Next OV: 02/06/24  Routing to provider for review/authorization.

## 2024-01-11 ENCOUNTER — Ambulatory Visit (HOSPITAL_COMMUNITY)
Admission: RE | Admit: 2024-01-11 | Discharge: 2024-01-11 | Disposition: A | Discharge status: Home / Self Care | Attending: Vascular Surgery | Admitting: Vascular Surgery

## 2024-01-11 ENCOUNTER — Telehealth: Payer: Self-pay

## 2024-01-11 ENCOUNTER — Encounter (HOSPITAL_COMMUNITY)
Admission: RE | Disposition: A | Discharge status: Home / Self Care | Payer: Self-pay | Source: Home / Self Care | Attending: Vascular Surgery

## 2024-01-11 ENCOUNTER — Other Ambulatory Visit: Payer: Self-pay | Admitting: *Deleted

## 2024-01-11 ENCOUNTER — Other Ambulatory Visit: Payer: Self-pay

## 2024-01-11 ENCOUNTER — Telehealth: Payer: Self-pay | Admitting: *Deleted

## 2024-01-11 DIAGNOSIS — G894 Chronic pain syndrome: Secondary | ICD-10-CM

## 2024-01-11 DIAGNOSIS — I5022 Chronic systolic (congestive) heart failure: Secondary | ICD-10-CM | POA: Insufficient documentation

## 2024-01-11 DIAGNOSIS — Z992 Dependence on renal dialysis: Secondary | ICD-10-CM | POA: Insufficient documentation

## 2024-01-11 DIAGNOSIS — Z87891 Personal history of nicotine dependence: Secondary | ICD-10-CM | POA: Diagnosis not present

## 2024-01-11 DIAGNOSIS — N186 End stage renal disease: Secondary | ICD-10-CM | POA: Insufficient documentation

## 2024-01-11 DIAGNOSIS — T82898A Other specified complication of vascular prosthetic devices, implants and grafts, initial encounter: Secondary | ICD-10-CM | POA: Diagnosis present

## 2024-01-11 DIAGNOSIS — Y832 Surgical operation with anastomosis, bypass or graft as the cause of abnormal reaction of the patient, or of later complication, without mention of misadventure at the time of the procedure: Secondary | ICD-10-CM | POA: Diagnosis not present

## 2024-01-11 DIAGNOSIS — I132 Hypertensive heart and chronic kidney disease with heart failure and with stage 5 chronic kidney disease, or end stage renal disease: Secondary | ICD-10-CM | POA: Insufficient documentation

## 2024-01-11 DIAGNOSIS — N185 Chronic kidney disease, stage 5: Secondary | ICD-10-CM

## 2024-01-11 HISTORY — PX: UPPER EXTREMITY ANGIOGRAPHY: CATH118270

## 2024-01-11 HISTORY — PX: AORTIC ARCH ANGIOGRAPHY: CATH118224

## 2024-01-11 LAB — POCT I-STAT, CHEM 8
BUN: 34 mg/dL — ABNORMAL HIGH (ref 8–23)
Calcium, Ion: 1.1 mmol/L — ABNORMAL LOW (ref 1.15–1.40)
Chloride: 98 mmol/L (ref 98–111)
Creatinine, Ser: 5.5 mg/dL — ABNORMAL HIGH (ref 0.44–1.00)
Glucose, Bld: 80 mg/dL (ref 70–99)
HCT: 32 % — ABNORMAL LOW (ref 36.0–46.0)
Hemoglobin: 10.9 g/dL — ABNORMAL LOW (ref 12.0–15.0)
Potassium: 4.4 mmol/L (ref 3.5–5.1)
Sodium: 139 mmol/L (ref 135–145)
TCO2: 31 mmol/L (ref 22–32)

## 2024-01-11 SURGERY — UPPER EXTREMITY ANGIOGRAPHY
Anesthesia: LOCAL

## 2024-01-11 MED ORDER — LABETALOL HCL 5 MG/ML IV SOLN: 10.0000 mg | INTRAVENOUS | Status: DC | PRN

## 2024-01-11 MED ORDER — MIDAZOLAM HCL 2 MG/2ML IJ SOLN
INTRAMUSCULAR | Status: DC | PRN
  Administered 2024-01-11: .5 mg via INTRAVENOUS

## 2024-01-11 MED ORDER — ONDANSETRON HCL 4 MG/2ML IJ SOLN: 4.0000 mg | Freq: Four times a day (QID) | INTRAMUSCULAR | Status: DC | PRN

## 2024-01-11 MED ORDER — IODIXANOL 320 MG/ML IV SOLN
INTRAVENOUS | Status: DC | PRN
  Administered 2024-01-11: 65 mL

## 2024-01-11 MED ORDER — LIDOCAINE HCL (PF) 1 % IJ SOLN
INTRAMUSCULAR | Status: AC
  Filled 2024-01-11: qty 30

## 2024-01-11 MED ORDER — HEPARIN (PORCINE) IN NACL 1000-0.9 UT/500ML-% IV SOLN
INTRAVENOUS | Status: DC | PRN
  Administered 2024-01-11 (×2): 500 mL

## 2024-01-11 MED ORDER — FENTANYL CITRATE (PF) 100 MCG/2ML IJ SOLN
INTRAMUSCULAR | Status: DC | PRN
  Administered 2024-01-11: 12.5 ug via INTRAVENOUS

## 2024-01-11 MED ORDER — HEPARIN SODIUM (PORCINE) 1000 UNIT/ML IJ SOLN
INTRAMUSCULAR | Status: DC | PRN
  Administered 2024-01-11: 3000 [IU] via INTRAVENOUS

## 2024-01-11 MED ORDER — LIDOCAINE-EPINEPHRINE 1 %-1:100000 IJ SOLN
INTRAMUSCULAR | Status: DC | PRN
  Administered 2024-01-11: 4 mL via INTRADERMAL

## 2024-01-11 MED ORDER — HEPARIN SODIUM (PORCINE) 1000 UNIT/ML IJ SOLN
INTRAMUSCULAR | Status: AC
  Filled 2024-01-11: qty 10

## 2024-01-11 MED ORDER — SODIUM CHLORIDE 0.9% FLUSH: 3.0000 mL | INTRAVENOUS | Status: DC | PRN

## 2024-01-11 MED ORDER — HYDRALAZINE HCL 20 MG/ML IJ SOLN
INTRAMUSCULAR | Status: AC
  Filled 2024-01-11: qty 1

## 2024-01-11 MED ORDER — LIDOCAINE-EPINEPHRINE 1 %-1:100000 IJ SOLN
INTRAMUSCULAR | Status: AC
  Filled 2024-01-11: qty 1

## 2024-01-11 MED ORDER — SODIUM CHLORIDE 0.9 % IV SOLN: 250.0000 mL | INTRAVENOUS | Status: DC | PRN

## 2024-01-11 MED ORDER — SODIUM CHLORIDE 0.9 % WEIGHT BASED INFUSION: 1.0000 mL/kg/h | INTRAVENOUS | Status: DC

## 2024-01-11 MED ORDER — SODIUM CHLORIDE 0.9% FLUSH: 3.0000 mL | Freq: Two times a day (BID) | INTRAVENOUS | Status: DC

## 2024-01-11 MED ORDER — HYDRALAZINE HCL 20 MG/ML IJ SOLN: 5.0000 mg | INTRAMUSCULAR | Status: DC | PRN

## 2024-01-11 MED ORDER — HYDRALAZINE HCL 20 MG/ML IJ SOLN
INTRAMUSCULAR | Status: DC | PRN
  Administered 2024-01-11: 20 mg via INTRAVENOUS

## 2024-01-11 MED ORDER — FENTANYL CITRATE (PF) 100 MCG/2ML IJ SOLN
INTRAMUSCULAR | Status: AC
  Filled 2024-01-11: qty 2

## 2024-01-11 MED ORDER — MIDAZOLAM HCL 2 MG/2ML IJ SOLN
INTRAMUSCULAR | Status: AC
  Filled 2024-01-11: qty 2

## 2024-01-11 MED ORDER — LIDOCAINE HCL (PF) 1 % IJ SOLN
INTRAMUSCULAR | Status: DC | PRN
  Administered 2024-01-11: 10 mL

## 2024-01-11 MED ORDER — ACETAMINOPHEN 325 MG PO TABS: 650.0000 mg | ORAL_TABLET | ORAL | Status: DC | PRN

## 2024-01-11 SURGICAL SUPPLY — 13 items
CATH ANGIO 5F PIGTAIL 100CM (CATHETERS) IMPLANT
CATH ANGIO 5F SIM1 100CM (CATHETERS) IMPLANT
CATH SYNTRAX .035X135 (CATHETERS) IMPLANT
COVER DOME SNAP 22 D (MISCELLANEOUS) ×3 IMPLANT
DEVICE CLOSURE MYNXGRIP 5F (Vascular Products) IMPLANT
GLIDEWIRE ADV .035X260CM (WIRE) IMPLANT
KIT MICROPUNCTURE NIT STIFF (SHEATH) IMPLANT
SET ATX-X65L (MISCELLANEOUS) IMPLANT
SHEATH PINNACLE 5F 10CM (SHEATH) IMPLANT
SHEATH PROBE COVER 6X72 (BAG) IMPLANT
TRAY PV CATH (CUSTOM PROCEDURE TRAY) ×3 IMPLANT
TUBING CIL FLEX 10 FLL-RA (TUBING) IMPLANT
WIRE BENTSON .035X145CM (WIRE) IMPLANT

## 2024-01-11 NOTE — Telephone Encounter (Signed)
 Prior Authorization has been submitted with last office notes awaiting approval or denial, please see note from today (01/11/24).

## 2024-01-11 NOTE — Telephone Encounter (Signed)
 Copied from CRM [id number]. Topic: Clinical - Prescription Issue >> Jan 11, 2024  1:31 PM Dennison Nancy wrote: Reason for CRM: patient called on status of the HYDROmorphone (DILAUDID) 2 MG tablet  Please contact patient to let know the status of her refill , see previous notes They advised that they need prescriber DEA number. Cannot accept the hospital DEA number.

## 2024-01-11 NOTE — Telephone Encounter (Signed)
 Prior Authorization for patient (HYDROmorphone HCl 2MG  tablets) came through on cover my meds was submitted with last office notes awaiting approval or denial.  ZOX:[id number]

## 2024-01-11 NOTE — Telephone Encounter (Signed)
 Copied from CRM [id number]. Topic: Clinical - Medication Question >> Jan 10, 2024  1:39 PM Suzette B wrote: Reason for CRM: Patient called to check her prescription status advised patient that the refill had been submitted this morning to her preferred pharmacy to check with them by end of day to see if the pharmacy has completed the refill. >> Jan 10, 2024  5:13 PM Tiffany H wrote: Patient called to advise that [facility] provided feedback about the Dilauded prescribed to patient.   They advised that they need prescriber DEA number. Cannot accept the hospital DEA number.   Medication to be released tomorrow.   Please follow up with patient once new prescription has been resent. Patient will be out of medication tomorrow. Please expedite if possible.

## 2024-01-11 NOTE — Discharge Instructions (Signed)
 Femoral Site Care This sheet gives you information about how to care for yourself after your procedure. Your health care provider may also give you more specific instructions. If you have problems or questions, contact your health care provider. What can I expect after the procedure?  After the procedure, it is common to have: Bruising that usually fades within 1-2 weeks. Tenderness at the site. Follow these instructions at home: Wound care Follow instructions from your health care provider about how to take care of your insertion site. Make sure you: Wash your hands with soap and water before you change your bandage (dressing). If soap and water are not available, use hand sanitizer. Remove your dressing as told by your health care provider. In 24 hours Do not take baths, swim, or use a hot tub until your health care provider approves. You may shower 24-48 hours after the procedure or as told by your health care provider. Gently wash the site with plain soap and water. Pat the area dry with a clean towel. Do not rub the site. This may cause bleeding. Do not apply powder or lotion to the site. Keep the site clean and dry. Check your femoral site every day for signs of infection. Check for: Redness, swelling, or pain. Fluid or blood. Warmth. Pus or a bad smell. Activity For the first 2-3 days after your procedure, or as long as directed: Avoid climbing stairs as much as possible. Do not squat. Do not lift anything that is heavier than 10 lb (4.5 kg), or the limit that you are told, until your health care provider says that it is safe. For 5 days Rest as directed. Avoid sitting for a long time without moving. Get up to take short walks every 1-2 hours. Do not drive for 24 hours if you were given a medicine to help you relax (sedative). General instructions Take over-the-counter and prescription medicines only as told by your health care provider. Keep all follow-up visits as told by  your health care provider. This is important. Contact a health care provider if you have: A fever or chills. You have redness, swelling, or pain around your insertion site. Get help right away if: The catheter insertion area swells very fast. You pass out. You suddenly start to sweat or your skin gets clammy. The catheter insertion area is bleeding, and the bleeding does not stop when you hold steady pressure on the area. The area near or just beyond the catheter insertion site becomes pale, cool, tingly, or numb. These symptoms may represent a serious problem that is an emergency. Do not wait to see if the symptoms will go away. Get medical help right away. Call your local emergency services (911 in the [place] Do not drive yourself to the hospital. Summary After the procedure, it is common to have bruising that usually fades within 1-2 weeks. Check your femoral site every day for signs of infection. Do not lift anything that is heavier than 10 lb (4.5 kg), or the limit that you are told, until your health care provider says that it is safe. This information is not intended to replace advice given to you by your health care provider. Make sure you discuss any questions you have with your health care provider. Document Revised: 10/03/2017 Document Reviewed: 10/03/2017 Elsevier Patient Education  2020 [employer].

## 2024-01-11 NOTE — Telephone Encounter (Signed)
 Audrey Hill (Key: [id number]) PA Case ID #: [id number] Need Help? Call us at [phone] Outcome Approved today by [facility] 2017 NCPDP Request Reference Number: [id number]. HYDROMORPHON TAB 2MG  is approved through 10/03/2024. Your patient may now fill this prescription and it will be covered. Effective Date: 01/11/2024 Authorization Expiration Date: 10/03/2024 Drug HYDROmorphone HCl 2MG  tablets ePA cloud logo Form [employer] Electronic Prior Authorization Form [phone] NCPDP)  I called the patient unable to reach her,I left her a voicemail.

## 2024-01-11 NOTE — Telephone Encounter (Signed)
 I called [facility] who stated will need another PA for Hydromorphone. And stated it has been faxed over.

## 2024-01-11 NOTE — Op Note (Signed)
    Patient name: Audrey Hill MRN: [id number] DOB: Dec 13, 1955 Sex: female  01/11/2024 Pre-operative Diagnosis: Left brachiocephalic fistula steal syndrome Post-operative diagnosis:  Same Surgeon:  Victorino Sparrow, MD Procedure Performed: 1.  Ultrasound-guided micropuncture access of the right common femoral artery in retrograde fashion 2.  Arch angiogram 3.  Second-order cannulation, left upper extremity angiogram 4.  Third order cannulation, left lower extremity angiogram 5.  Device assisted closure-Mynx 6.  Moderate sedation time 27 minutes, contrast volume 65ml    Indications: Patient is 68 year old female with previous history of left arm brachiocephalic fistula with concern for steal syndrome.  After discussing risks and benefits of left upper extremity angiogram in an effort to define possible steal syndrome, [name] elected to proceed.  Findings:  Aortogram: Patent aortic arch with no stenosis identified in the innominate artery, right subclavian, right carotid, left carotid, left subclavian  Left upper extremity: Widely patent left subclavian, left axillary artery, left brachial artery, radial artery, ulnar artery, palmar arch Contrast filling beyond the left brachial artery was nonexistent without fistula compression. Patient with steal syndrome.   Procedure:  The patient was identified in the holding area and taken to room 8.  The patient was then placed supine on the table and prepped and draped in the usual sterile fashion.  A time out was called.  Ultrasound was used to evaluate the right common femoral artery.  It was patent .  A digital ultrasound image was acquired.  A micropuncture needle was used to access the right common femoral artery under ultrasound guidance.  An 018 wire was advanced without resistance and a micropuncture sheath was placed.  The 018 wire was removed and a benson wire was placed.  The micropuncture sheath was exchanged for a 5 french sheath.   The patient was heparinized and a pigtail catheter was placed in the aortic arch.  Arch angiography followed.  Next, a SIM 2 catheter was used to cannulate the left subclavian artery.  The wire was run into the left brachial artery.  Imaging initially occurred from the left axillary artery with a catheter then moved to the brachial artery both before and after the fistula anastomosis.  See images.    Impression: Widely patent vasculature in the left upper extremity.  Patient with steal syndrome from the left brachiocephalic fistula.  She would benefit from fistula revision.  Will work on timing and will discuss options with her.   Victorino Sparrow MD [facility] Office: [phone]

## 2024-01-11 NOTE — Telephone Encounter (Signed)
 Called pt - no answer; left message to call the office about Hydromorphone.

## 2024-01-11 NOTE — Telephone Encounter (Signed)
 Copied from CRM [id number]. Topic: Clinical - Prescription Issue >> Jan 11, 2024  3:24 PM Brandy W wrote: Reason for CRM: HYDROmorphone (DILAUDID) 2 MG tablet- patient stated that  [id number] - [place], [place] - [address] AT [facility] OF [address], [place] [address]   Does not have it in stock and she is wanting to have it sent to another [facility] somewhere around [address] in [place], [place].  Patient would also like a callback [phone]
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
## 2024-01-11 NOTE — Telephone Encounter (Signed)
 Patient called about getting her Dilaudid.  Was unavailable at her chosen pharmacy.  [facility]

## 2024-01-12 ENCOUNTER — Other Ambulatory Visit: Payer: Self-pay | Admitting: *Deleted

## 2024-01-12 ENCOUNTER — Telehealth: Payer: Self-pay | Admitting: *Deleted

## 2024-01-12 ENCOUNTER — Encounter (HOSPITAL_COMMUNITY): Payer: Self-pay | Admitting: Vascular Surgery

## 2024-01-12 DIAGNOSIS — N186 End stage renal disease: Secondary | ICD-10-CM

## 2024-01-12 MED ORDER — HYDROMORPHONE HCL 2 MG PO TABS: 2.0000 mg | ORAL_TABLET | Freq: Two times a day (BID) | ORAL | 0 refills | Status: DC | PRN

## 2024-01-12 NOTE — Telephone Encounter (Signed)
 Return call to [facility] - no answer; left message of office's return call.

## 2024-01-12 NOTE — Telephone Encounter (Signed)
 I spoke with Dena from [facility] regarding orders that were faxed on 3/27. Order # [id number] is in Dr. Mikey Bussing box for him to co-sign with the residents. I will faxed back the order once it has been signed.

## 2024-01-12 NOTE — Telephone Encounter (Signed)
 Copied from CRM [id number]. Topic: General - Other >> Jan 12, 2024 11:48 AM Hector Shade B wrote: Reason for CRM: Deanna from [facility] called to check status of some orders that were faxed over on the 27th of March. Order (reference number is [id number]) Please call Ms. Deanna at [phone],

## 2024-01-12 NOTE — Addendum Note (Signed)
 Addended by: Modena Slater on: 01/12/2024 08:42 AM   Modules accepted: Orders

## 2024-01-12 NOTE — Telephone Encounter (Signed)
 Patient need new prescription for the Hydromorphone [facility] [place] [facility] located on [place]

## 2024-01-13 ENCOUNTER — Other Ambulatory Visit: Payer: Self-pay

## 2024-01-13 DIAGNOSIS — J45909 Unspecified asthma, uncomplicated: Secondary | ICD-10-CM

## 2024-01-13 MED ORDER — ALBUTEROL SULFATE HFA 108 (90 BASE) MCG/ACT IN AERS: 2.0000 | INHALATION_SPRAY | Freq: Four times a day (QID) | RESPIRATORY_TRACT | 1 refills | Status: DC | PRN

## 2024-01-16 ENCOUNTER — Other Ambulatory Visit: Payer: Self-pay | Admitting: Student

## 2024-01-16 ENCOUNTER — Other Ambulatory Visit: Payer: Self-pay | Admitting: Internal Medicine

## 2024-01-16 DIAGNOSIS — I10 Essential (primary) hypertension: Secondary | ICD-10-CM

## 2024-01-16 NOTE — Telephone Encounter (Signed)
 Medication refilled today, Cancelled reorder and closing encounter.

## 2024-01-16 NOTE — Telephone Encounter (Signed)
 Copied from CRM [id number]. Topic: Clinical - Medication Refill >> Jan 16, 2024 11:32 AM Blair Bumpers wrote: Most Recent Primary Care Visit:  Provider: Fay Hoop  Department: [facility]  Visit Type: OPEN ESTABLISHED  Date: 12/09/2023  Medication: carvedilol (COREG) 3.125 MG tablet  Has the patient contacted their pharmacy? Yes, states the pharmacy has sent over a fax.  (Agent: If no, request that the patient contact the pharmacy for the refill. If patient does not wish to contact the pharmacy document the reason why and proceed with request.) (Agent: If yes, when and what did the pharmacy advise?)  Is this the correct pharmacy for this prescription? Yes If no, delete pharmacy and type the correct one.  This is the patient's preferred pharmacy:   [id number] - [place], [place] - [address] AT [facility] OF [address] [place] [address] Phone: [phone] Fax: [phone]     Has the prescription been filled recently? No  Is the patient out of the medication? Yes  Has the patient been seen for an appointment in the last year OR does the patient have an upcoming appointment? Yes  Can we respond through MyChart? No  Agent: Please be advised that Rx refills may take up to 3 business days. We ask that you follow-up with your pharmacy.

## 2024-01-16 NOTE — Telephone Encounter (Signed)
 Medication sent to pharmacy

## 2024-01-17 ENCOUNTER — Other Ambulatory Visit: Payer: Self-pay

## 2024-01-17 ENCOUNTER — Ambulatory Visit (HOSPITAL_COMMUNITY): Admission: RE | Admit: 2024-01-17 | Source: Home / Self Care | Admitting: Vascular Surgery

## 2024-01-17 ENCOUNTER — Encounter (HOSPITAL_COMMUNITY): Admission: RE | Payer: Self-pay | Source: Home / Self Care

## 2024-01-17 DIAGNOSIS — Z992 Dependence on renal dialysis: Secondary | ICD-10-CM

## 2024-01-17 SURGERY — INSERTION OF ARTERIOVENOUS (AV) GORE-TEX GRAFT ARM (CATHLAB)
Anesthesia: Monitor Anesthesia Care | Laterality: Left

## 2024-01-18 ENCOUNTER — Encounter (HOSPITAL_COMMUNITY): Payer: Self-pay | Admitting: Vascular Surgery

## 2024-01-19 ENCOUNTER — Other Ambulatory Visit: Payer: Self-pay

## 2024-01-19 ENCOUNTER — Encounter (HOSPITAL_COMMUNITY): Payer: Self-pay | Admitting: Vascular Surgery

## 2024-01-19 ENCOUNTER — Telehealth: Payer: Self-pay

## 2024-01-19 NOTE — Telephone Encounter (Signed)
 Patient left a message asking when patient could have "pain shot" in her hip after procedure.  This nurse called to ask what kind of shot?  Cortisone injection?   Surgery is to be performed on arm for AV Graft.  No issues seen with receiving injection in hip.

## 2024-01-19 NOTE — Progress Notes (Signed)
 SDW CALL  Patient was given pre-op instructions over the phone. The opportunity was given for the patient to ask questions. No further questions asked. Patient verbalized understanding of instructions given.   PCP - Jonelle Neri, DO Cardiologist - Zahid Junagadhwalla,MD  PPM/ICD - denies Device Orders -  Rep Notified -   Chest x-ray - 10/29/23 EKG - 10/29/23 Stress Test - 03/21/2019 ECHO - 08/03/2021 Cardiac Cath - 09/15/11  Sleep Study - denies CPAP -   Fasting Blood Sugar - na Checks Blood Sugar _____ times a day  Blood Thinner Instructions:na Aspirin Instructions:nan  ERAS Protcol -no PRE-SURGERY Ensure or G2-   COVID TEST- na   Anesthesia review: yes-hx afib,asthma, HTN  Patient denies shortness of breath, fever, cough and chest pain over the phone call     Oral Hygiene is also important to reduce your risk of infection.  Remember - BRUSH YOUR TEETH THE MORNING OF SURGERY WITH YOUR REGULAR TOOTHPASTE

## 2024-01-19 NOTE — Anesthesia Preprocedure Evaluation (Addendum)
 Anesthesia Evaluation  Patient identified by MRN, date of birth, ID band Patient awake    Reviewed: Allergy & Precautions, NPO status , Patient's Chart, lab work & pertinent test results, reviewed documented beta blocker date and time   History of Anesthesia Complications Negative for: history of anesthetic complications  Airway Mallampati: II  TM Distance: >3 FB     Dental  (+) Edentulous Upper   Pulmonary asthma , COPD,  oxygen dependent, former smoker   breath sounds clear to auscultation       Cardiovascular hypertension, pulmonary hypertension+CHF  (-) CAD, (-) Past MI and (-) Cardiac Stents + Valvular Problems/Murmurs MR  Rhythm:Regular Rate:Normal  Left ventricular ejection fraction, by estimation, is 30 to 35%. The  left ventricle has moderately decreased function. The left ventricle  demonstrates global hypokinesis. The left ventricular internal cavity size  was mildly dilated. There is severe  concentric left ventricular hypertrophy. Left ventricular diastolic  parameters are consistent with Grade III diastolic dysfunction  (restrictive). Elevated left ventricular end-diastolic pressure. The  average left ventricular global longitudinal strain is   -8.1 %. The global longitudinal strain is abnormal.   2. Right ventricular systolic function is normal. The right ventricular  size is normal. Mildly increased right ventricular wall thickness. There  is severely elevated pulmonary artery systolic pressure. The estimated  right ventricular systolic pressure  is 73.7 mmHg.   3. Left atrial size was severely dilated.   4. Right atrial size was severely dilated.   5. The pericardial effusion is circumferential.   6. The mitral valve is degenerative. Mild to moderate mitral valve  regurgitation. No evidence of mitral stenosis.   7. The aortic valve is tricuspid. Aortic valve regurgitation is not  visualized. Mild aortic valve  sclerosis is present, with no evidence of  aortic valve stenosis. Aortic valve mean gradient measures 5.0 mmHg.  Aortic valve Vmax measures 1.69 m/s.     Neuro/Psych  Headaches, Seizures -,  PSYCHIATRIC DISORDERS Anxiety Depression    CVA, No Residual Symptoms    GI/Hepatic ,GERD  ,,(+) Hepatitis -  Endo/Other    Renal/GU ESRF and DialysisRenal disease     Musculoskeletal  (+) Arthritis ,    Abdominal   Peds  Hematology  (+) Blood dyscrasia, anemia   Anesthesia Other Findings   Reproductive/Obstetrics                              Anesthesia Physical Anesthesia Plan  ASA: 4  Anesthesia Plan: General   Post-op Pain Management:    Induction: Intravenous  PONV Risk Score and Plan: 2 and Ondansetron  and Dexamethasone   Airway Management Planned: LMA  Additional Equipment:   Intra-op Plan:   Post-operative Plan: Extubation in OR  Informed Consent: I have reviewed the patients History and Physical, chart, labs and discussed the procedure including the risks, benefits and alternatives for the proposed anesthesia with the patient or authorized representative who has indicated his/her understanding and acceptance.     Dental advisory given  Plan Discussed with: CRNA  Anesthesia Plan Comments: (See PAT note written 01/19/2024 by Ella Gun, PA-C regarding cardiac history.   )        Anesthesia Quick Evaluation
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
## 2024-01-20 ENCOUNTER — Encounter (HOSPITAL_COMMUNITY)
Admission: RE | Disposition: A | Discharge status: Home / Self Care | Payer: Self-pay | Source: Home / Self Care | Attending: Vascular Surgery

## 2024-01-20 ENCOUNTER — Other Ambulatory Visit: Payer: Self-pay

## 2024-01-20 ENCOUNTER — Ambulatory Visit (HOSPITAL_COMMUNITY): Admitting: Vascular Surgery

## 2024-01-20 ENCOUNTER — Ambulatory Visit (HOSPITAL_BASED_OUTPATIENT_CLINIC_OR_DEPARTMENT_OTHER): Admitting: Vascular Surgery

## 2024-01-20 ENCOUNTER — Encounter (HOSPITAL_COMMUNITY): Payer: Self-pay | Admitting: Vascular Surgery

## 2024-01-20 ENCOUNTER — Ambulatory Visit (HOSPITAL_COMMUNITY)
Admission: RE | Admit: 2024-01-20 | Discharge: 2024-01-20 | Disposition: A | Discharge status: Home / Self Care | Attending: Vascular Surgery | Admitting: Vascular Surgery

## 2024-01-20 DIAGNOSIS — Z9981 Dependence on supplemental oxygen: Secondary | ICD-10-CM | POA: Insufficient documentation

## 2024-01-20 DIAGNOSIS — Z992 Dependence on renal dialysis: Secondary | ICD-10-CM | POA: Diagnosis not present

## 2024-01-20 DIAGNOSIS — N186 End stage renal disease: Secondary | ICD-10-CM

## 2024-01-20 DIAGNOSIS — I5022 Chronic systolic (congestive) heart failure: Secondary | ICD-10-CM | POA: Diagnosis not present

## 2024-01-20 DIAGNOSIS — I132 Hypertensive heart and chronic kidney disease with heart failure and with stage 5 chronic kidney disease, or end stage renal disease: Secondary | ICD-10-CM | POA: Diagnosis not present

## 2024-01-20 DIAGNOSIS — I509 Heart failure, unspecified: Secondary | ICD-10-CM | POA: Insufficient documentation

## 2024-01-20 DIAGNOSIS — X58XXXA Exposure to other specified factors, initial encounter: Secondary | ICD-10-CM | POA: Diagnosis not present

## 2024-01-20 DIAGNOSIS — R519 Headache, unspecified: Secondary | ICD-10-CM | POA: Insufficient documentation

## 2024-01-20 DIAGNOSIS — J449 Chronic obstructive pulmonary disease, unspecified: Secondary | ICD-10-CM | POA: Diagnosis not present

## 2024-01-20 DIAGNOSIS — T82898A Other specified complication of vascular prosthetic devices, implants and grafts, initial encounter: Secondary | ICD-10-CM | POA: Insufficient documentation

## 2024-01-20 DIAGNOSIS — N185 Chronic kidney disease, stage 5: Secondary | ICD-10-CM | POA: Diagnosis not present

## 2024-01-20 DIAGNOSIS — Z87891 Personal history of nicotine dependence: Secondary | ICD-10-CM | POA: Insufficient documentation

## 2024-01-20 DIAGNOSIS — F32A Depression, unspecified: Secondary | ICD-10-CM | POA: Diagnosis not present

## 2024-01-20 DIAGNOSIS — I272 Pulmonary hypertension, unspecified: Secondary | ICD-10-CM | POA: Diagnosis not present

## 2024-01-20 DIAGNOSIS — K219 Gastro-esophageal reflux disease without esophagitis: Secondary | ICD-10-CM | POA: Insufficient documentation

## 2024-01-20 DIAGNOSIS — M1990 Unspecified osteoarthritis, unspecified site: Secondary | ICD-10-CM | POA: Insufficient documentation

## 2024-01-20 DIAGNOSIS — F419 Anxiety disorder, unspecified: Secondary | ICD-10-CM | POA: Insufficient documentation

## 2024-01-20 DIAGNOSIS — D631 Anemia in chronic kidney disease: Secondary | ICD-10-CM | POA: Diagnosis not present

## 2024-01-20 HISTORY — PX: REVISION OF ARTERIOVENOUS GORETEX GRAFT: SHX6073

## 2024-01-20 HISTORY — DX: Cardiomyopathy, unspecified: I42.9

## 2024-01-20 HISTORY — PX: RESECTION OF ARTERIOVENOUS FISTULA ANEURYSM: SHX6070

## 2024-01-20 LAB — POCT I-STAT, CHEM 8
BUN: 26 mg/dL — ABNORMAL HIGH (ref 8–23)
Calcium, Ion: 1.14 mmol/L — ABNORMAL LOW (ref 1.15–1.40)
Chloride: 100 mmol/L (ref 98–111)
Creatinine, Ser: 4.2 mg/dL — ABNORMAL HIGH (ref 0.44–1.00)
Glucose, Bld: 71 mg/dL (ref 70–99)
HCT: 30 % — ABNORMAL LOW (ref 36.0–46.0)
Hemoglobin: 10.2 g/dL — ABNORMAL LOW (ref 12.0–15.0)
Potassium: 4 mmol/L (ref 3.5–5.1)
Sodium: 138 mmol/L (ref 135–145)
TCO2: 30 mmol/L (ref 22–32)

## 2024-01-20 LAB — GLUCOSE, CAPILLARY: Glucose-Capillary: 81 mg/dL (ref 70–99)

## 2024-01-20 SURGERY — REVISION OF ARTERIOVENOUS GORETEX GRAFT
Anesthesia: General | Site: Arm Lower | Laterality: Left

## 2024-01-20 MED ORDER — OXYCODONE HCL 5 MG/5ML PO SOLN: 5.0000 mg | Freq: Once | ORAL | Status: DC | PRN

## 2024-01-20 MED ORDER — PHENYLEPHRINE HCL (PRESSORS) 10 MG/ML IV SOLN
INTRAVENOUS | Status: DC | PRN
  Administered 2024-01-20 (×2): 80 ug via INTRAVENOUS

## 2024-01-20 MED ORDER — SODIUM CHLORIDE 0.9% FLUSH: 3.0000 mL | INTRAVENOUS | Status: DC | PRN

## 2024-01-20 MED ORDER — CHLORHEXIDINE GLUCONATE 4 % EX SOLN: 60.0000 mL | Freq: Once | CUTANEOUS | Status: DC

## 2024-01-20 MED ORDER — CHLORHEXIDINE GLUCONATE 0.12 % MT SOLN: 15.0000 mL | Freq: Once | OROMUCOSAL | Status: AC

## 2024-01-20 MED ORDER — HYDROMORPHONE HCL 2 MG PO TABS
ORAL_TABLET | ORAL | Status: AC
  Filled 2024-01-20: qty 1

## 2024-01-20 MED ORDER — SODIUM CHLORIDE 0.9 % IV SOLN: INTRAVENOUS | Status: DC | PRN

## 2024-01-20 MED ORDER — FENTANYL CITRATE (PF) 250 MCG/5ML IJ SOLN
INTRAMUSCULAR | Status: AC
  Filled 2024-01-20: qty 5

## 2024-01-20 MED ORDER — FENTANYL CITRATE (PF) 250 MCG/5ML IJ SOLN
INTRAMUSCULAR | Status: DC | PRN
Start: 2024-01-20 — End: 2024-01-20
  Administered 2024-01-20 (×2): 50 ug via INTRAVENOUS

## 2024-01-20 MED ORDER — HYDROMORPHONE HCL 2 MG PO TABS
2.0000 mg | ORAL_TABLET | Freq: Once | ORAL | Status: AC
  Administered 2024-01-20: 2 mg via ORAL

## 2024-01-20 MED ORDER — CHLORHEXIDINE GLUCONATE 0.12 % MT SOLN
OROMUCOSAL | Status: AC
  Administered 2024-01-20: 15 mL via OROMUCOSAL
  Filled 2024-01-20: qty 15

## 2024-01-20 MED ORDER — PROPOFOL 500 MG/50ML IV EMUL
INTRAVENOUS | Status: DC | PRN
  Administered 2024-01-20: 50 mg via INTRAVENOUS

## 2024-01-20 MED ORDER — CEFAZOLIN SODIUM-DEXTROSE 2-4 GM/100ML-% IV SOLN: 2.0000 g | INTRAVENOUS | Status: DC

## 2024-01-20 MED ORDER — ORAL CARE MOUTH RINSE: 15.0000 mL | Freq: Once | OROMUCOSAL | Status: AC

## 2024-01-20 MED ORDER — DEXAMETHASONE SODIUM PHOSPHATE 10 MG/ML IJ SOLN
INTRAMUSCULAR | Status: DC | PRN
  Administered 2024-01-20: 4 mg via INTRAVENOUS

## 2024-01-20 MED ORDER — HEPARIN 6000 UNIT IRRIGATION SOLUTION
Status: DC | PRN
  Administered 2024-01-20: 1

## 2024-01-20 MED ORDER — CEFAZOLIN SODIUM-DEXTROSE 2-4 GM/100ML-% IV SOLN
2.0000 g | INTRAVENOUS | Status: AC
  Administered 2024-01-20: 2 g via INTRAVENOUS

## 2024-01-20 MED ORDER — PHENYLEPHRINE HCL-NACL 20-0.9 MG/250ML-% IV SOLN
INTRAVENOUS | Status: DC | PRN
  Administered 2024-01-20: 50 ug/min via INTRAVENOUS

## 2024-01-20 MED ORDER — LIDOCAINE HCL (CARDIAC) PF 100 MG/5ML IV SOSY
PREFILLED_SYRINGE | INTRAVENOUS | Status: DC | PRN
  Administered 2024-01-20: 100 mg via INTRATRACHEAL

## 2024-01-20 MED ORDER — ONDANSETRON HCL 4 MG/2ML IJ SOLN: 4.0000 mg | Freq: Once | INTRAMUSCULAR | Status: DC | PRN

## 2024-01-20 MED ORDER — SODIUM CHLORIDE 0.9% FLUSH
3.0000 mL | Freq: Two times a day (BID) | INTRAVENOUS | Status: DC
  Administered 2024-01-20: 10 mL via INTRAVENOUS

## 2024-01-20 MED ORDER — 0.9 % SODIUM CHLORIDE (POUR BTL) OPTIME
TOPICAL | Status: DC | PRN
  Administered 2024-01-20: 1000 mL

## 2024-01-20 MED ORDER — FENTANYL CITRATE (PF) 100 MCG/2ML IJ SOLN: 25.0000 ug | INTRAMUSCULAR | Status: DC | PRN

## 2024-01-20 MED ORDER — ACETAMINOPHEN 10 MG/ML IV SOLN: 1000.0000 mg | Freq: Once | INTRAVENOUS | Status: DC | PRN

## 2024-01-20 MED ORDER — HEPARIN SODIUM (PORCINE) 1000 UNIT/ML IJ SOLN
INTRAMUSCULAR | Status: DC | PRN
  Administered 2024-01-20: 2000 [IU] via INTRAVENOUS

## 2024-01-20 MED ORDER — CEFAZOLIN SODIUM-DEXTROSE 2-4 GM/100ML-% IV SOLN
INTRAVENOUS | Status: AC
  Filled 2024-01-20: qty 100

## 2024-01-20 MED ORDER — ONDANSETRON HCL 4 MG/2ML IJ SOLN
INTRAMUSCULAR | Status: DC | PRN
  Administered 2024-01-20: 4 mg via INTRAVENOUS

## 2024-01-20 MED ORDER — OXYCODONE HCL 5 MG PO TABS: 5.0000 mg | ORAL_TABLET | Freq: Four times a day (QID) | ORAL | 0 refills | Status: DC | PRN

## 2024-01-20 MED ORDER — SODIUM CHLORIDE 0.9% FLUSH: 3.0000 mL | Freq: Two times a day (BID) | INTRAVENOUS | Status: DC

## 2024-01-20 MED ORDER — OXYCODONE HCL 5 MG PO TABS: 5.0000 mg | ORAL_TABLET | Freq: Once | ORAL | Status: DC | PRN

## 2024-01-20 MED ORDER — HEPARIN 6000 UNIT IRRIGATION SOLUTION
Status: AC
Start: 2024-01-20 — End: ?
  Filled 2024-01-20: qty 500

## 2024-01-20 SURGICAL SUPPLY — 37 items
APPLIER CLIP 9.375 SM OPEN (CLIP) ×2 IMPLANT
ARMBAND PINK RESTRICT EXTREMIT (MISCELLANEOUS) ×3 IMPLANT
BAG COUNTER SPONGE SURGICOUNT (BAG) ×3 IMPLANT
BALLN MUSTANG 4X40X135 (BALLOONS) ×2 IMPLANT
BALLOON MUSTANG 4X40X135 (BALLOONS) IMPLANT
CANISTER SUCT 3000ML PPV (MISCELLANEOUS) ×3 IMPLANT
CLIP APPLIE 9.375 SM OPEN (CLIP) ×3 IMPLANT
CLIP TI MEDIUM 24 (CLIP) ×3 IMPLANT
CLIP TI MEDIUM 6 (CLIP) ×9 IMPLANT
CLIP TI WIDE RED SMALL 24 (CLIP) ×3 IMPLANT
CLIP TI WIDE RED SMALL 6 (CLIP) ×6 IMPLANT
COVER PROBE W GEL 5X96 (DRAPES) IMPLANT
DERMABOND ADVANCED .7 DNX12 (GAUZE/BANDAGES/DRESSINGS) ×3 IMPLANT
ELECT REM PT RETURN 9FT ADLT (ELECTROSURGICAL) ×2 IMPLANT
ELECTRODE REM PT RTRN 9FT ADLT (ELECTROSURGICAL) ×3 IMPLANT
GLOVE BIOGEL PI IND STRL 8 (GLOVE) ×3 IMPLANT
GOWN STRL REUS W/ TWL LRG LVL3 (GOWN DISPOSABLE) ×6 IMPLANT
GOWN STRL REUS W/TWL 2XL LVL3 (GOWN DISPOSABLE) ×6 IMPLANT
KIT BASIN OR (CUSTOM PROCEDURE TRAY) ×3 IMPLANT
KIT ENCORE 26 ADVANTAGE (KITS) IMPLANT
KIT TURNOVER KIT B (KITS) ×3 IMPLANT
NDL HYPO 25GX1X1/2 BEV (NEEDLE) ×3 IMPLANT
NEEDLE HYPO 25GX1X1/2 BEV (NEEDLE) ×2 IMPLANT
NS IRRIG 1000ML POUR BTL (IV SOLUTION) ×3 IMPLANT
PACK CV ACCESS (CUSTOM PROCEDURE TRAY) ×3 IMPLANT
PAD ARMBOARD POSITIONER FOAM (MISCELLANEOUS) ×6 IMPLANT
SPIKE FLUID TRANSFER (MISCELLANEOUS) ×3 IMPLANT
STOPCOCK 4 WAY LG BORE MALE ST (IV SETS) IMPLANT
SUT MNCRL AB 4-0 PS2 18 (SUTURE) ×3 IMPLANT
SUT PROLENE 6 0 BV (SUTURE) ×6 IMPLANT
SUT PROLENE 7 0 BV 1 (SUTURE) IMPLANT
SUT SILK 2 0 SH (SUTURE) IMPLANT
SUT VIC AB 3-0 SH 27X BRD (SUTURE) ×6 IMPLANT
SYR 20ML LL LF (SYRINGE) IMPLANT
TOWEL GREEN STERILE (TOWEL DISPOSABLE) ×3 IMPLANT
UNDERPAD 30X36 HEAVY ABSORB (UNDERPADS AND DIAPERS) ×3 IMPLANT
WATER STERILE IRR 1000ML POUR (IV SOLUTION) ×3 IMPLANT

## 2024-01-20 NOTE — Discharge Instructions (Signed)
 [facility]  Discharge Instructions  AV Fistula or Graft Surgery for Dialysis Access  Please refer to the following instructions for your post-procedure care. Your surgeon or physician assistant will discuss any changes with you.  Activity  You may drive the day following your surgery, if you are comfortable and no longer taking prescription pain medication. Resume full activity as the soreness in your incision resolves.  Bathing/Showering  You may shower after you go home. Keep your incision dry for 48 hours. Do not soak in a bathtub, hot tub, or swim until the incision heals completely. You may not shower if you have a hemodialysis catheter.  Incision Care  Clean your incision with mild soap and water after 48 hours. Pat the area dry with a clean towel. You do not need a bandage unless otherwise instructed. Do not apply any ointments or creams to your incision. You may have skin glue on your incision. Do not peel it off. It will come off on its own in about one week. Your arm may swell a bit after surgery. To reduce swelling use pillows to elevate your arm so it is above your heart. Your doctor will tell you if you need to lightly wrap your arm with an ACE bandage.  Diet  Resume your normal diet. There are not special food restrictions following this procedure. In order to heal from your surgery, it is CRITICAL to get adequate nutrition. Your body requires vitamins, minerals, and protein. Vegetables are the best source of vitamins and minerals. Vegetables also provide the perfect balance of protein. Processed food has little nutritional value, so try to avoid this.  Medications  Resume taking all of your medications. If your incision is causing pain, you may take over-the counter pain relievers such as acetaminophen  (Tylenol ). If you were prescribed a stronger pain medication, please be aware these medications can cause nausea and constipation. Prevent  nausea by taking the medication with a snack or meal. Avoid constipation by drinking plenty of fluids and eating foods with high amount of fiber, such as fruits, vegetables, and grains.  Do not take Tylenol  if you are taking prescription pain medications.  Follow up Your surgeon may want to see you in the office following your access surgery. If so, this will be arranged at the time of your surgery.  Please call us  immediately for any of the following conditions:  Increased pain, redness, drainage (pus) from your incision site Fever of 101 degrees or higher Severe or worsening pain at your incision site Hand pain or numbness.  Reduce your risk of vascular disease:  Stop smoking. If you would like help, call [facility] at 1-800-QUIT-NOW (873-576-3830) or [place] at 519-029-4620  Manage your cholesterol Maintain a desired weight Control your diabetes Keep your blood pressure down  Dialysis  It will take several weeks to several months for your new dialysis access to be ready for use. Your surgeon will determine when it is okay to use it. Your nephrologist will continue to direct your dialysis. You can continue to use your Permcath until your new access is ready for use.   01/20/2024 Audrey Hill [id number] December 26, 1955  Surgeon(s): Lanis Fonda BRAVO, MD  Procedure(s): REVISION OF ARTERIOVENOUS GORETEX GRAFT RESECTION OF ARTERIOVENOUS ANEURYSM   May stick graft immediately  X May stick fistula in designated area only from mid arm up towards shoulder. DO NOT stick along incision or close to antecubitum  X Do not stick Fistula  in area of incision for 6 weeks    If you have any questions, please call the office at [phone].

## 2024-01-20 NOTE — Transfer of Care (Signed)
 Immediate Anesthesia Transfer of Care Note  Patient: Audrey Hill  Procedure(s) Performed: REVISION OF ARTERIOVENOUS GORETEX GRAFT (Left) RESECTION OF ARTERIOVENOUS ANEURYSM (Left: Arm Lower)  Patient Location: PACU  Anesthesia Type:General  Level of Consciousness: awake, alert , and oriented  Airway & Oxygen Therapy: Patient Spontanous Breathing and Patient connected to face mask oxygen  Post-op Assessment: Report given to RN and Post -op Vital signs reviewed and stable  Post vital signs: Reviewed and stable  Last Vitals:  Vitals Value Taken Time  BP 124/60 01/20/24 1026  Temp    Pulse 49 01/20/24 1029  Resp 21 01/20/24 1029  SpO2 100 % 01/20/24 1029  Vitals shown include unfiled device data.  Last Pain:  Vitals:   01/20/24 0744  TempSrc:   PainSc: 0-No pain         Complications: No notable events documented.

## 2024-01-20 NOTE — Op Note (Signed)
    NAME: Audrey Hill    MRN: [id number] DOB: 07/18/1956    DATE OF OPERATION: 01/20/2024  PREOP DIAGNOSIS:    Left arm steal syndrome  POSTOP DIAGNOSIS:    Same  PROCEDURE:    Left arm brachiocephalic fistula revision Aneurysmectomy with primary repair  SURGEON: Kayla Part  ASSIST: Wynonia Hedges, PA  ANESTHESIA: Moderate, block  EBL: 10 mL  INDICATIONS:    Audrey Hill is a 68 y.o. female with previous history of left-sided brachiocephalic fistula creation.  She presented to the office with a several month history of waxing waning left hand numbness and pain.  Follow-up arterial angiography demonstrated steal syndrome.  After discussing risks and benefits of left arm fistula revision, Audrey Hill elected to proceed.  FINDINGS:   Large, left arm brachiocephalic fistula.  Aneurysmal portion at the antecubital fossa this was resected with primary closure over a 4 mm Mustang balloon to narrow the lumen and improve distal perfusion to the hand.  Radial and ulnar pulses palpable at case completion.  Thrill in the fistula  TECHNIQUE:   Patient was brought to the OR laid in supine position.  Anesthesia was induced patient prepped and draped standard fashion. Next in the case began with a longitudinal incision along the left arm brachiocephalic fistula in the antecubital fossa.  A small aneurysm was exposed.  The fistula was mobilized from surrounding tissue.  The patient was heparinized and the fistula clamped.  The aneurysm was opened, and the majority of it resected.  A 4 mm balloon was brought onto the field and laid in the wound bed.  Next, the venotomy was closed over the 4 mm balloon using running 6-0 Prolene suture.   At completion, flow was restored.  There was a palpable radial pulse and palpable ulnar pulse at case completion  The wound was irrigated with copious amounts saline and closed using 3-0 Vicryl with Monocryl and Dermabond along the  skin.  Kayla Part, MD [facility] DATE OF DICTATION:   01/20/2024
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
## 2024-01-20 NOTE — Anesthesia Postprocedure Evaluation (Signed)
 Anesthesia Post Note  Patient: Audrey Hill  Procedure(s) Performed: REVISION OF ARTERIOVENOUS GORETEX GRAFT (Left) RESECTION OF ARTERIOVENOUS ANEURYSM (Left: Arm Lower)     Patient location during evaluation: PACU Anesthesia Type: General Level of consciousness: awake and alert Pain management: pain level controlled Vital Signs Assessment: post-procedure vital signs reviewed and stable Respiratory status: spontaneous breathing, nonlabored ventilation, respiratory function stable and patient connected to nasal cannula oxygen Cardiovascular status: blood pressure returned to baseline and stable Postop Assessment: no apparent nausea or vomiting Anesthetic complications: no   No notable events documented.  Last Vitals:  Vitals:   01/20/24 1115 01/20/24 1130  BP: (!) 132/59 132/61  Pulse: (!) 53 (!) 50  Resp: 20 (!) 22  Temp:    SpO2: 93% 99%    Last Pain:  Vitals:   01/20/24 1130  TempSrc:   PainSc: 0-No pain                 Leslye Rast

## 2024-01-21 ENCOUNTER — Encounter (HOSPITAL_COMMUNITY): Payer: Self-pay | Admitting: Vascular Surgery

## 2024-01-27 ENCOUNTER — Other Ambulatory Visit: Payer: Medicare HMO | Admitting: Pharmacist

## 2024-01-27 ENCOUNTER — Telehealth: Payer: Self-pay | Admitting: Pharmacist

## 2024-01-27 ENCOUNTER — Other Ambulatory Visit: Payer: Self-pay | Admitting: Student

## 2024-01-27 DIAGNOSIS — G894 Chronic pain syndrome: Secondary | ICD-10-CM

## 2024-01-27 DIAGNOSIS — I10 Essential (primary) hypertension: Secondary | ICD-10-CM

## 2024-01-27 MED ORDER — CARVEDILOL 3.125 MG PO TABS: 3.1250 mg | ORAL_TABLET | Freq: Two times a day (BID) | ORAL | 3 refills | Status: DC

## 2024-01-27 MED ORDER — SERTRALINE HCL 25 MG PO TABS: 25.0000 mg | ORAL_TABLET | Freq: Every day | ORAL | 2 refills | Status: DC

## 2024-01-27 MED ORDER — ISOSORBIDE DINITRATE 20 MG PO TABS: 20.0000 mg | ORAL_TABLET | Freq: Three times a day (TID) | ORAL | 3 refills | Status: DC

## 2024-01-27 MED ORDER — RENA-VITE PO TABS: 1.0000 | ORAL_TABLET | Freq: Every day | ORAL | 1 refills | Status: DC

## 2024-01-27 NOTE — Telephone Encounter (Unsigned)
 Copied from CRM [id number]. Topic: Clinical - Medication Refill >> Jan 27, 2024 10:42 AM Corin V wrote: Most Recent Primary Care Visit:  Provider: Fay Hoop  Department: [facility]  Visit Type: OPEN ESTABLISHED  Date: 12/09/2023  Medication: lidocaine  (LIDODERM ) 5 %  Has the patient contacted their pharmacy? Yes (Agent: If no, request that the patient contact the pharmacy for the refill. If patient does not wish to contact the pharmacy document the reason why and proceed with request.) (Agent: If yes, when and what did the pharmacy advise?)  Is this the correct pharmacy for this prescription? Yes If no, delete pharmacy and type the correct one.  This is the patient's preferred pharmacy:  [id number] - [place], [place] - [address] AT [facility] [address] [place] [address] Phone: [phone] Fax: [phone]  Has the prescription been filled recently? No  Is the patient out of the medication? Yes  Has the patient been seen for an appointment in the last year OR does the patient have an upcoming appointment? Yes  Can we respond through MyChart? No  Agent: Please be advised that Rx refills may take up to 3 business days. We ask that you follow-up with your pharmacy.

## 2024-01-27 NOTE — Progress Notes (Signed)
   01/27/2024  Patient ID: Audrey Hill, female   DOB: 10/31/1955, 68 y.o.   MRN: [id number]  Attempted to contact patient for scheduled appointment for medication management. Left HIPAA compliant message for patient to return my call at their convenience. Also unable to reach daughter as well.   Had a few questions for the patient regarding medications. Will try again in 1 week.    Delvin File, PharmD [facility]  Phone Number: [phone]

## 2024-01-27 NOTE — Telephone Encounter (Signed)
 Copied from CRM [id number]. Topic: Clinical - Medication Refill >> Jan 27, 2024  8:19 AM Danelle Dunning F wrote: Most Recent Primary Care Visit:  Provider: Fay Hoop  Department: [facility]  Visit Type: OPEN ESTABLISHED  Date: 12/09/2023  Caller: Edwina Gram (Home health care Nurse)  Calling From: [facility]   Looking to get some medication refills in place for mail service; The nurse wanted to make the office aware that [facility] will be sending in a refill request for isosorbide  dinitrate (ISORDIL ) 20 MG tablet and sertraline  (ZOLOFT ) 25 MG tablet  Medication:   1. carvedilol  (COREG ) 3.125 MG tablet 2. multivitamin (RENA-VIT) TABS tablet 3. isosorbide  dinitrate (ISORDIL ) 20 MG tablet 4. sertraline  (ZOLOFT ) 25 MG tablet  Has the patient contacted their pharmacy? Yes  Is this the correct pharmacy for this prescription? Yes If no, delete pharmacy and type the correct one.  This is the patient's preferred pharmacy:    [facility], [place] - [address] [place] [address] Phone: [phone] Fax: [phone]   Has the prescription been filled recently? No  Is the patient out of the medication? Yes  Has the patient been seen for an appointment in the last year OR does the patient have an upcoming appointment? Yes  Can we respond through MyChart? No  Agent: Please be advised that Rx refills may take up to 3 business days. We ask that you follow-up with your pharmacy.

## 2024-01-30 MED ORDER — LIDOCAINE 5 % EX PTCH: 1.0000 | MEDICATED_PATCH | Freq: Every day | CUTANEOUS | 0 refills | Status: DC | PRN

## 2024-01-30 NOTE — Telephone Encounter (Signed)
Next appt scheduled 5/5 with PCP. 

## 2024-02-06 ENCOUNTER — Telehealth: Payer: Self-pay | Admitting: Surgery

## 2024-02-06 ENCOUNTER — Ambulatory Visit: Payer: Self-pay | Admitting: Student

## 2024-02-06 ENCOUNTER — Other Ambulatory Visit: Payer: Self-pay | Admitting: Student

## 2024-02-06 ENCOUNTER — Encounter: Payer: Self-pay | Admitting: Student

## 2024-02-06 ENCOUNTER — Telehealth: Payer: Self-pay | Admitting: *Deleted

## 2024-02-06 ENCOUNTER — Telehealth: Payer: Self-pay | Admitting: Student

## 2024-02-06 ENCOUNTER — Ambulatory Visit: Admitting: Student

## 2024-02-06 VITALS — BP 150/72 | HR 52 | Ht 66.0 in | Wt 155.7 lb

## 2024-02-06 DIAGNOSIS — G894 Chronic pain syndrome: Secondary | ICD-10-CM

## 2024-02-06 DIAGNOSIS — Z13820 Encounter for screening for osteoporosis: Secondary | ICD-10-CM

## 2024-02-06 DIAGNOSIS — N186 End stage renal disease: Secondary | ICD-10-CM

## 2024-02-06 DIAGNOSIS — I272 Pulmonary hypertension, unspecified: Secondary | ICD-10-CM

## 2024-02-06 DIAGNOSIS — R0609 Other forms of dyspnea: Secondary | ICD-10-CM

## 2024-02-06 DIAGNOSIS — Z992 Dependence on renal dialysis: Secondary | ICD-10-CM

## 2024-02-06 DIAGNOSIS — I5020 Unspecified systolic (congestive) heart failure: Secondary | ICD-10-CM

## 2024-02-06 DIAGNOSIS — Z79899 Other long term (current) drug therapy: Secondary | ICD-10-CM

## 2024-02-06 DIAGNOSIS — Z1231 Encounter for screening mammogram for malignant neoplasm of breast: Secondary | ICD-10-CM

## 2024-02-06 DIAGNOSIS — G4700 Insomnia, unspecified: Secondary | ICD-10-CM

## 2024-02-06 HISTORY — DX: Encounter for screening for osteoporosis: Z13.820

## 2024-02-06 MED ORDER — TRAZODONE HCL 50 MG PO TABS: 100.0000 mg | ORAL_TABLET | Freq: Every day | ORAL | 3 refills | Status: DC

## 2024-02-06 MED ORDER — SERTRALINE HCL 25 MG PO TABS: 25.0000 mg | ORAL_TABLET | Freq: Every day | ORAL | 3 refills | Status: DC

## 2024-02-06 MED ORDER — HYDROMORPHONE HCL 2 MG PO TABS: 2.0000 mg | ORAL_TABLET | Freq: Two times a day (BID) | ORAL | 0 refills | Status: DC | PRN

## 2024-02-06 MED ORDER — VELPHORO 500 MG PO CHEW: 500.0000 mg | CHEWABLE_TABLET | Freq: Three times a day (TID) | ORAL | 4 refills | Status: DC

## 2024-02-06 NOTE — Assessment & Plan Note (Signed)
 Patient is currently euvolemic on exam.  Patient is still taking carvedilol  3.125 mg twice daily as well as Lasix  80 mg daily.  No acute concerns at this time.  Plan: - Continue carvedilol  3.125 mg twice daily - Continue Lasix  80 mg daily

## 2024-02-06 NOTE — Assessment & Plan Note (Signed)
-  Referred patient for DEXA scan

## 2024-02-06 NOTE — Assessment & Plan Note (Signed)
 Patient has a longstanding history of chronic pain.  She is on Dilaudid  2 mg twice daily.  She states she has persistent pain despite this.  I talked to her about chronic pain and opioid medication.  She is due to see Dr.Moore with the pain clinic to evaluate this further.  Will continue with her Dilaudid  for now.  Ultimately, would like to get patient off of her Dilaudid .  Plan: - Refill Dilaudid  2 mg as needed twice daily

## 2024-02-06 NOTE — Assessment & Plan Note (Signed)
 Patient is currently on dialysis Tuesday, Thursday, Saturdays.  No acute concerns.

## 2024-02-06 NOTE — Assessment & Plan Note (Signed)
 Patient states that she would like to try trazodone  100 mg instead of 50.  I think that should be fine.  Plan: - Increase trazodone  to 100 mg nightly
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
## 2024-02-06 NOTE — Assessment & Plan Note (Signed)
 Will refer patient to geriatric clinic in June to evaluate for polypharmacy.  Will need to see if we can potentially stop some of these medications.  Reactive airway disease: Albuterol  A-fib: Amiodarone  200 mg  Hyperlipidemia: Lipitor 40 mg daily  Constipation:  MiraLAX  17 mg twice daily, senna 2 mg daily yes ESRD: Calcium  acetate 3 times daily, sevelamer 800 mg 3 times daily, Renavite tabs HFrEF: Carvedilol  3.125 mg twice daily, Lasix  80 mg daily Pain:  Voltaren  gel, lidocaine  patch Iron  deficiency anemia: Ferrous sulfate 325 mg every other day Hypertension: Hydralazine  25 mg 3 times daily, isosorbide  dinitrate 20 mg 3 times daily, losartan  50 mg daily Chronic pain: Dilaudid  2 mg every 8 hours as needed Insomnia: Trazodone  50 mg GERD: Protonix  40 mg daily Depression: Zoloft  25 mg daily

## 2024-02-06 NOTE — Assessment & Plan Note (Signed)
 Imaging findings suggestive of severe pulmonary hypertension.  Patient has not had a right heart cath before.  Could benefit from 1.  Will hold off for now.  Will continue to follow.  Currently diuresing patient with Lasix .  Also getting dialysis Tuesday, Thursday, Saturdays.  Please see dyspnea on exertion problem for further detail.

## 2024-02-06 NOTE — Telephone Encounter (Signed)
 Mammogram appt breast center May 28.2025 @ 9:50 amm to arrive 9:30 am / patient with appointment in hand. Will call with dexa scan.

## 2024-02-06 NOTE — Telephone Encounter (Signed)
 ED RNCM received call from patient concerning her prescriptions that was sent in from Internal Medicine Clinic not being filled by pharmacy due to prescriber not being registered on the narcotic database.  ED RNCM notified Dr. Kelly Patient On-call for Internal Medicine to follow up with patient Patient has been updated.

## 2024-02-06 NOTE — Telephone Encounter (Signed)
 Copied from CRM [id number]. Topic: Clinical - Prescription Issue >> Feb 06, 2024  2:28 PM Lenon Radar A wrote: Reason for CRM: Patient called in regarding medication HYDROmorphone  (DILAUDID ) 2 MG tablet. Patient stated that pharmacy advised that they do not carry the medication any longer and that a different medication needed to be sent in for her. Patient is asking for call back to discuss other options. Please contact patient asap.

## 2024-02-06 NOTE — Telephone Encounter (Signed)
 Pt called / informed Hydromorphone  rx has been sent to [facility] on [address] as requested.

## 2024-02-06 NOTE — Patient Instructions (Addendum)
 Audrey Hill,Thank you for allowing me to take part in your care today.  Here are your instructions.  1.  I have refilled your medications.  2.  [facility]  3.  I have scheduled you for mammogram and DEXA scan.  4.  I have ordered for pulmonary function tests to evaluate your shortness of breath.  5.  Please come back in 1 month.  Thank you, Dr. Lydia Sams  If you have any other questions please contact the internal medicine clinic at [phone] If it is after hours, please call the [facility] at [phone] and then ask the person who picks up for the resident on call.

## 2024-02-06 NOTE — Telephone Encounter (Signed)
 Hydromorphone  rx was refilled this am;pt was called. [facility] [place] [facility] not [facility]. [facility] stated [facility] ; it's on backorder. Pt was informed -  pt asked to call [facility] on [address]. I called and they do have the qty prescribed. I will ask Dr Lydia Sams to re-send rx.

## 2024-02-06 NOTE — Assessment & Plan Note (Addendum)
 Patient presented to the clinic today with main concern being needing oxygen.  She states that they put her on oxygen when she goes to dialysis.  She states when she walks into dialysis she is out of breath.  They give her oxygen, and she feels better.  She presents today to try to see if she needs outpatient oxygen at home.  During ambulation today, patient did not drop below 94 SpO2.  She does not qualify for at home oxygen.  I did talk to her about her shortness of breath and she reported that she gets shortness of breath with exertion.  She denies any shortness of breath with rest.  Upon chart review, patient has multiple etiologies of why she could be short of breath.  First she has ESRD which could lead to volume overload causing shortness of breath.  She also has heart failure with reduced ejection fraction causing shortness of breath.  She has a history of smoking as well as imaging finding suggestive of bronchitis or reactive airway disease which could also cause her to be short of breath.  Lastly, patient has evidence of pulmonary artery hypertension which could also cause her to be short of breath.  At this time, I would like to evaluate patient with PFTs to see if patient can further qualify her reactive airway disease.  Dialysis is helping patient.  Patient is also on a diuretic.  I do wonder if patient needs right heart cath, but right now we will start with pulmonary function test.  Patient does not need home oxygen at this time, but if disease does continue to progress, she may qualify in the future.  Plan: - PFTs

## 2024-02-06 NOTE — Progress Notes (Signed)
 Patient called after hours by ED RN Dalia Ducking about Audrey Hill. She was seen in clinic earlier today and was prescribed her stable dose of dilaudid . However, [facility] resident's license was not approved for dispense at [facility]. PDMP reviewed and new order placed to be picked up on 02/07/2024 at same pharmacy.

## 2024-02-06 NOTE — Telephone Encounter (Signed)
 Copied from CRM [id number]. Topic: Clinical - Prescription Issue >> Feb 06, 2024  1:35 PM Tisa Forester wrote: Reason for CRM: Patient called on 02/06/24 checking on status of the medication refill for the HYDROmorphone  (DILAUDID ) 2 MG tablet , the pharmacy do not have the refill medication yet   Patient callback : [phone] Every month  patient stated its an issue with getting her pain medication   [id number] [place], [place] - [address] AT [facility] [id number] [place] [address]Phone: [phone] Fax: [phone]: Not open 24 hours
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
## 2024-02-07 ENCOUNTER — Other Ambulatory Visit: Payer: Self-pay

## 2024-02-07 ENCOUNTER — Telehealth: Payer: Self-pay | Admitting: Internal Medicine

## 2024-02-07 DIAGNOSIS — G894 Chronic pain syndrome: Secondary | ICD-10-CM

## 2024-02-07 MED ORDER — LIDOCAINE 5 % EX PTCH: 1.0000 | MEDICATED_PATCH | Freq: Every day | CUTANEOUS | 0 refills | Status: DC | PRN

## 2024-02-07 MED ORDER — HYDROMORPHONE HCL 2 MG PO TABS: 2.0000 mg | ORAL_TABLET | Freq: Two times a day (BID) | ORAL | 0 refills | Status: DC | PRN

## 2024-02-07 NOTE — Telephone Encounter (Signed)
 I called [facility] - stated unable to accept resident's DEA#. Sending to the Attending to re-send rx refill for Hydromorphone . Thanks

## 2024-02-07 NOTE — Telephone Encounter (Signed)
 I spoke to a pharmacist at [facility] regarding her dilaudid  prescription. I confirmed that she has avascular necrosis causing chronic pain syndrome in her hips. She has been on dilaudid  chronically for this and has seen orthopedic surgery. She has contraindications to oral NSAIDS and is taking topical medicines (lidocaine , voltaren ) without significant relief. Duloxetine was tried previously, but stopped for ESRD and it wasn't particularly effective.

## 2024-02-07 NOTE — Telephone Encounter (Signed)
 Patient called she requesting her rx for lidocaine  to be sent to [facility] instead of [facility].

## 2024-02-07 NOTE — Telephone Encounter (Signed)
Rx refill has already been addressed.

## 2024-02-07 NOTE — Telephone Encounter (Signed)
 CommunicationReason for CRM: [employer] called stating they are not able to use the signing provider's license to fill the prescription. Please call [facility] [phone]

## 2024-02-07 NOTE — Telephone Encounter (Signed)
 Copied from CRM [id number]. Topic: Clinical - Prescription Issue >> Feb 07, 2024  9:31 AM Audrey Hill wrote: Reason for CRM: [facility] pharmacy called stating they are not able to use the signing provider's license to fill the prescription. Please call [facility] [phone]

## 2024-02-07 NOTE — Telephone Encounter (Signed)
 I called the pharmacy to confirm the provider the rx was sent by, per pharmacy the rx needs to be sent by a different provider. The patients insurance will not accept Dr.Caraballo.

## 2024-02-08 ENCOUNTER — Telehealth: Payer: Self-pay

## 2024-02-08 ENCOUNTER — Telehealth: Payer: Self-pay | Admitting: *Deleted

## 2024-02-08 NOTE — Telephone Encounter (Signed)
 Copied from CRM [id number]. Topic: Clinical - Prescription Issue >> Feb 08, 2024  4:31 PM Julie Oddi wrote: Reason for CRM: [facility] called to let provider know that medication sucroferric oxyhydroxide (VELPHORO) 500 MG chewable tablet is on backorder. If patient needs different medication please submit new RX

## 2024-02-08 NOTE — Telephone Encounter (Signed)
 Received a call from [facility] regarding a rx for Hydromorphone . Per [facility] the patient stated that she was unable to pick up her rx because it had to be approved. I called and spoke with the pharmacy the pharmacists stated that the patient came to the pharmacy to pick up the rx and the pharmacists told her that she will need about 20 minutes to get a pharmacists to approve the rx so that she can release it to the patient. Per pharmacists the rx is ready for pick up.  I called and spoke to the patient and advised her of the previous information. Patient is aware that the rx is ready for pick up.

## 2024-02-09 ENCOUNTER — Telehealth: Payer: Self-pay | Admitting: Student

## 2024-02-09 MED ORDER — SEVELAMER CARBONATE 800 MG PO TABS
800.0000 mg | ORAL_TABLET | Freq: Three times a day (TID) | ORAL | 3 refills | Status: AC
Start: 2024-02-09 — End: ?

## 2024-02-09 NOTE — Telephone Encounter (Signed)
 Velphoro on back order, going to send in Renvela.

## 2024-02-13 NOTE — Progress Notes (Signed)
 Internal Medicine Clinic Attending  Case discussed with the resident at the time of the visit.  We reviewed the resident's history and exam and pertinent patient test results.  I agree with the assessment, diagnosis, and plan of care documented in the resident's note. I look forward to meeting her in geriatrics assessment clinic.
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
## 2024-02-16 ENCOUNTER — Ambulatory Visit: Attending: Vascular Surgery | Admitting: Vascular Surgery

## 2024-02-16 ENCOUNTER — Encounter: Payer: Self-pay | Admitting: Vascular Surgery

## 2024-02-16 VITALS — BP 191/96 | HR 64 | Temp 98.2°F

## 2024-02-16 DIAGNOSIS — Z992 Dependence on renal dialysis: Secondary | ICD-10-CM

## 2024-02-16 DIAGNOSIS — N186 End stage renal disease: Secondary | ICD-10-CM

## 2024-02-22 ENCOUNTER — Telehealth: Payer: Self-pay | Admitting: Pharmacist

## 2024-02-22 DIAGNOSIS — I10 Essential (primary) hypertension: Secondary | ICD-10-CM

## 2024-02-22 MED ORDER — HYDRALAZINE HCL 25 MG PO TABS: 50.0000 mg | ORAL_TABLET | Freq: Three times a day (TID) | ORAL | 3 refills | Status: DC

## 2024-02-22 NOTE — Progress Notes (Addendum)
   02/22/2024  Patient ID: Audrey Hill, female   DOB: [birth year], 68 y.o.   MRN: [id number]  Called and spoke with the patient on the phone today regarding medications since switching from [facility] to [facility]. Received medication packaging for 30DS of those that were ready. Has been taking those as well as her medications the bottles working to finish those up as well (not taking both).   Reports she will be getting pain medication from [facility] going forward- having difficulty getting registered MD to send it prior.   Needs iron  refill sent to [facility].  Lastly, BP has been very high lately. Causing headaches. BP was 191/96 on 5/15 visit at [facility] office. Said when checking it at home, it has been around 200/108, so recent office visit is accurate. Confirmed taking all BP medications as prescribed. Said it is also not lowering much after dialysis either.  Due to not seeing new Cardiology until next month, will send message to [facility] doctor currently.   Also discussed imaging for next Wednesday. Provided phone number to call and verify location since there is a $75 missed appt fee.  Update from 5/22: - Called and spoke with the patient on the phone regarding BP - Advised new script for Hydralazine  was sent to [facility] and should be coming soon - For now, can take 2 Hydralazines three times a day to equal the new dose - Said this is something she could do and confirmed understanding - Advised to continue checking BP readings during this timeframe and to call the office if systolic >200    Delvin File, PharmD [facility]  Phone Number: [phone]

## 2024-02-28 ENCOUNTER — Emergency Department (HOSPITAL_COMMUNITY)

## 2024-02-28 ENCOUNTER — Inpatient Hospital Stay (HOSPITAL_COMMUNITY)
Admission: EM | Admit: 2024-02-28 | Discharge: 2024-03-04 | DRG: 291 | Disposition: A | Discharge status: Home / Self Care | Attending: Internal Medicine | Admitting: Internal Medicine

## 2024-02-28 ENCOUNTER — Other Ambulatory Visit: Payer: Self-pay

## 2024-02-28 ENCOUNTER — Encounter (HOSPITAL_COMMUNITY): Payer: Self-pay

## 2024-02-28 DIAGNOSIS — Z90711 Acquired absence of uterus with remaining cervical stump: Secondary | ICD-10-CM

## 2024-02-28 DIAGNOSIS — Z8616 Personal history of COVID-19: Secondary | ICD-10-CM

## 2024-02-28 DIAGNOSIS — K219 Gastro-esophageal reflux disease without esophagitis: Secondary | ICD-10-CM | POA: Diagnosis present

## 2024-02-28 DIAGNOSIS — Z8673 Personal history of transient ischemic attack (TIA), and cerebral infarction without residual deficits: Secondary | ICD-10-CM

## 2024-02-28 DIAGNOSIS — A084 Viral intestinal infection, unspecified: Secondary | ICD-10-CM | POA: Diagnosis present

## 2024-02-28 DIAGNOSIS — Z87891 Personal history of nicotine dependence: Secondary | ICD-10-CM

## 2024-02-28 DIAGNOSIS — I5020 Unspecified systolic (congestive) heart failure: Secondary | ICD-10-CM | POA: Diagnosis present

## 2024-02-28 DIAGNOSIS — I429 Cardiomyopathy, unspecified: Secondary | ICD-10-CM | POA: Diagnosis present

## 2024-02-28 DIAGNOSIS — E785 Hyperlipidemia, unspecified: Secondary | ICD-10-CM | POA: Diagnosis present

## 2024-02-28 DIAGNOSIS — R1310 Dysphagia, unspecified: Secondary | ICD-10-CM | POA: Diagnosis present

## 2024-02-28 DIAGNOSIS — N186 End stage renal disease: Secondary | ICD-10-CM

## 2024-02-28 DIAGNOSIS — R112 Nausea with vomiting, unspecified: Secondary | ICD-10-CM | POA: Diagnosis not present

## 2024-02-28 DIAGNOSIS — Z803 Family history of malignant neoplasm of breast: Secondary | ICD-10-CM

## 2024-02-28 DIAGNOSIS — G4700 Insomnia, unspecified: Secondary | ICD-10-CM | POA: Diagnosis present

## 2024-02-28 DIAGNOSIS — Z8249 Family history of ischemic heart disease and other diseases of the circulatory system: Secondary | ICD-10-CM

## 2024-02-28 DIAGNOSIS — R519 Headache, unspecified: Secondary | ICD-10-CM | POA: Diagnosis present

## 2024-02-28 DIAGNOSIS — D72819 Decreased white blood cell count, unspecified: Secondary | ICD-10-CM | POA: Diagnosis present

## 2024-02-28 DIAGNOSIS — I5023 Acute on chronic systolic (congestive) heart failure: Secondary | ICD-10-CM | POA: Diagnosis present

## 2024-02-28 DIAGNOSIS — Z96652 Presence of left artificial knee joint: Secondary | ICD-10-CM | POA: Diagnosis present

## 2024-02-28 DIAGNOSIS — J45909 Unspecified asthma, uncomplicated: Secondary | ICD-10-CM | POA: Diagnosis present

## 2024-02-28 DIAGNOSIS — D631 Anemia in chronic kidney disease: Secondary | ICD-10-CM | POA: Diagnosis present

## 2024-02-28 DIAGNOSIS — K644 Residual hemorrhoidal skin tags: Secondary | ICD-10-CM | POA: Diagnosis present

## 2024-02-28 DIAGNOSIS — M542 Cervicalgia: Secondary | ICD-10-CM | POA: Diagnosis present

## 2024-02-28 DIAGNOSIS — I480 Paroxysmal atrial fibrillation: Secondary | ICD-10-CM | POA: Diagnosis present

## 2024-02-28 DIAGNOSIS — Z833 Family history of diabetes mellitus: Secondary | ICD-10-CM

## 2024-02-28 DIAGNOSIS — I509 Heart failure, unspecified: Principal | ICD-10-CM

## 2024-02-28 DIAGNOSIS — Z992 Dependence on renal dialysis: Secondary | ICD-10-CM

## 2024-02-28 DIAGNOSIS — I10 Essential (primary) hypertension: Secondary | ICD-10-CM

## 2024-02-28 DIAGNOSIS — Z83438 Family history of other disorder of lipoprotein metabolism and other lipidemia: Secondary | ICD-10-CM

## 2024-02-28 DIAGNOSIS — R001 Bradycardia, unspecified: Secondary | ICD-10-CM | POA: Diagnosis present

## 2024-02-28 DIAGNOSIS — F32A Depression, unspecified: Secondary | ICD-10-CM | POA: Diagnosis present

## 2024-02-28 DIAGNOSIS — R188 Other ascites: Secondary | ICD-10-CM | POA: Diagnosis present

## 2024-02-28 DIAGNOSIS — Z79899 Other long term (current) drug therapy: Secondary | ICD-10-CM

## 2024-02-28 DIAGNOSIS — G894 Chronic pain syndrome: Secondary | ICD-10-CM | POA: Diagnosis present

## 2024-02-28 DIAGNOSIS — M7061 Trochanteric bursitis, right hip: Secondary | ICD-10-CM | POA: Diagnosis present

## 2024-02-28 DIAGNOSIS — D696 Thrombocytopenia, unspecified: Secondary | ICD-10-CM | POA: Diagnosis present

## 2024-02-28 DIAGNOSIS — Z1152 Encounter for screening for COVID-19: Secondary | ICD-10-CM

## 2024-02-28 DIAGNOSIS — R0902 Hypoxemia: Secondary | ICD-10-CM | POA: Diagnosis present

## 2024-02-28 DIAGNOSIS — E876 Hypokalemia: Secondary | ICD-10-CM | POA: Diagnosis present

## 2024-02-28 DIAGNOSIS — Z888 Allergy status to other drugs, medicaments and biological substances status: Secondary | ICD-10-CM

## 2024-02-28 DIAGNOSIS — I272 Pulmonary hypertension, unspecified: Secondary | ICD-10-CM | POA: Diagnosis present

## 2024-02-28 DIAGNOSIS — N2581 Secondary hyperparathyroidism of renal origin: Secondary | ICD-10-CM | POA: Diagnosis present

## 2024-02-28 DIAGNOSIS — R748 Abnormal levels of other serum enzymes: Secondary | ICD-10-CM | POA: Diagnosis present

## 2024-02-28 DIAGNOSIS — M7062 Trochanteric bursitis, left hip: Secondary | ICD-10-CM | POA: Diagnosis present

## 2024-02-28 DIAGNOSIS — I132 Hypertensive heart and chronic kidney disease with heart failure and with stage 5 chronic kidney disease, or end stage renal disease: Principal | ICD-10-CM | POA: Diagnosis present

## 2024-02-28 DIAGNOSIS — Z886 Allergy status to analgesic agent status: Secondary | ICD-10-CM

## 2024-02-28 DIAGNOSIS — M879 Osteonecrosis, unspecified: Secondary | ICD-10-CM | POA: Diagnosis present

## 2024-02-28 DIAGNOSIS — J9601 Acute respiratory failure with hypoxia: Secondary | ICD-10-CM

## 2024-02-28 LAB — TROPONIN I (HIGH SENSITIVITY)
Troponin I (High Sensitivity): 162 ng/L (ref <18)
Troponin I (High Sensitivity): 152 ng/L (ref <18)

## 2024-02-28 LAB — CBC
HCT: 37.8 % (ref 36.0–46.0)
Hemoglobin: 12.1 g/dL (ref 12.0–15.0)
MCH: 26.5 pg (ref 26.0–34.0)
MCHC: 32 g/dL (ref 30.0–36.0)
MCV: 82.9 fL (ref 80.0–100.0)
Platelets: 130 10*3/uL — ABNORMAL LOW (ref 150–400)
RBC: 4.56 MIL/uL (ref 3.87–5.11)
RDW: 19 % — ABNORMAL HIGH (ref 11.5–15.5)
WBC: 4.2 10*3/uL (ref 4.0–10.5)
nRBC: 0 % (ref 0.0–0.2)

## 2024-02-28 LAB — COMPREHENSIVE METABOLIC PANEL WITH GFR
ALT: 51 U/L — ABNORMAL HIGH (ref 0–44)
AST: 41 U/L (ref 15–41)
Albumin: 3.6 g/dL (ref 3.5–5.0)
Alkaline Phosphatase: 168 U/L — ABNORMAL HIGH (ref 38–126)
Anion gap: 13 (ref 5–15)
BUN: 16 mg/dL (ref 8–23)
CO2: 27 mmol/L (ref 22–32)
Calcium: 8.7 mg/dL — ABNORMAL LOW (ref 8.9–10.3)
Chloride: 97 mmol/L — ABNORMAL LOW (ref 98–111)
Creatinine, Ser: 4.61 mg/dL — ABNORMAL HIGH (ref 0.44–1.00)
GFR, Estimated: 10 mL/min — ABNORMAL LOW (ref >60)
Glucose, Bld: 85 mg/dL (ref 70–99)
Potassium: 3 mmol/L — ABNORMAL LOW (ref 3.5–5.1)
Sodium: 137 mmol/L (ref 135–145)
Total Bilirubin: 1 mg/dL (ref 0.0–1.2)
Total Protein: 7.5 g/dL (ref 6.5–8.1)

## 2024-02-28 LAB — URINALYSIS, ROUTINE W REFLEX MICROSCOPIC
Bilirubin Urine: NEGATIVE
Glucose, UA: NEGATIVE mg/dL
Ketones, ur: NEGATIVE mg/dL
Leukocytes,Ua: NEGATIVE
Nitrite: NEGATIVE
Protein, ur: 100 mg/dL — AB
Specific Gravity, Urine: 1.008 (ref 1.005–1.030)
pH: 8 (ref 5.0–8.0)

## 2024-02-28 LAB — LIPASE, BLOOD: Lipase: 43 U/L (ref 11–51)

## 2024-02-28 LAB — BRAIN NATRIURETIC PEPTIDE: B Natriuretic Peptide: >4500 pg/mL — ABNORMAL HIGH (ref 0.0–100.0)

## 2024-02-28 MED ORDER — ALUM & MAG HYDROXIDE-SIMETH 200-200-20 MG/5ML PO SUSP
30.0000 mL | Freq: Once | ORAL | Status: AC
  Administered 2024-02-29: 30 mL via ORAL
  Filled 2024-02-28: qty 30

## 2024-02-28 MED ORDER — FAMOTIDINE IN NACL 20-0.9 MG/50ML-% IV SOLN
20.0000 mg | Freq: Once | INTRAVENOUS | Status: AC
  Administered 2024-02-29: 20 mg via INTRAVENOUS
  Filled 2024-02-28: qty 50

## 2024-02-28 MED ORDER — HYDROMORPHONE HCL 2 MG PO TABS: 2.0000 mg | ORAL_TABLET | Freq: Two times a day (BID) | ORAL | Status: DC | PRN

## 2024-02-28 MED ORDER — CARVEDILOL 3.125 MG PO TABS
3.1250 mg | ORAL_TABLET | Freq: Two times a day (BID) | ORAL | Status: DC
  Administered 2024-02-28: 3.125 mg via ORAL
  Filled 2024-02-28: qty 1

## 2024-02-28 MED ORDER — HYDROCODONE-ACETAMINOPHEN 5-325 MG PO TABS
1.0000 | ORAL_TABLET | Freq: Once | ORAL | Status: DC
  Filled 2024-02-28: qty 1

## 2024-02-28 MED ORDER — ISOSORBIDE DINITRATE 20 MG PO TABS
20.0000 mg | ORAL_TABLET | Freq: Three times a day (TID) | ORAL | Status: DC
  Administered 2024-02-28 – 2024-03-04 (×12): 20 mg via ORAL
  Filled 2024-02-28 (×7): qty 1
  Filled 2024-02-28: qty 2
  Filled 2024-02-28 (×2): qty 1
  Filled 2024-02-28: qty 2
  Filled 2024-02-28 (×3): qty 1

## 2024-02-28 MED ORDER — ONDANSETRON 4 MG PO TBDP
4.0000 mg | ORAL_TABLET | Freq: Once | ORAL | Status: AC
  Administered 2024-02-28: 4 mg via ORAL
  Filled 2024-02-28: qty 1

## 2024-02-28 MED ORDER — LOSARTAN POTASSIUM 50 MG PO TABS
50.0000 mg | ORAL_TABLET | Freq: Every day | ORAL | Status: DC
  Administered 2024-02-29 – 2024-03-04 (×4): 50 mg via ORAL
  Filled 2024-02-28 (×6): qty 1

## 2024-02-28 MED ORDER — LIDOCAINE VISCOUS HCL 2 % MT SOLN
15.0000 mL | Freq: Once | OROMUCOSAL | Status: AC
  Administered 2024-02-29: 15 mL via ORAL
  Filled 2024-02-28: qty 15

## 2024-02-28 MED ORDER — OXYCODONE-ACETAMINOPHEN 5-325 MG PO TABS
1.0000 | ORAL_TABLET | Freq: Once | ORAL | Status: AC
  Administered 2024-02-28: 1 via ORAL
  Filled 2024-02-28: qty 1

## 2024-02-28 MED ORDER — ONDANSETRON HCL 4 MG/2ML IJ SOLN: 4.0000 mg | Freq: Once | INTRAMUSCULAR | Status: DC

## 2024-02-28 MED ORDER — OXYCODONE HCL 5 MG PO TABS
5.0000 mg | ORAL_TABLET | Freq: Once | ORAL | Status: AC
  Administered 2024-02-28: 5 mg via ORAL
  Filled 2024-02-28: qty 1

## 2024-02-28 MED ORDER — HYDROMORPHONE HCL 1 MG/ML IJ SOLN
0.5000 mg | Freq: Once | INTRAMUSCULAR | Status: DC
  Filled 2024-02-28: qty 1

## 2024-02-28 MED ORDER — HYDRALAZINE HCL 25 MG PO TABS
50.0000 mg | ORAL_TABLET | Freq: Three times a day (TID) | ORAL | Status: DC
  Administered 2024-02-28: 50 mg via ORAL
  Filled 2024-02-28: qty 2

## 2024-02-28 NOTE — ED Provider Notes (Signed)
 [place] [facility] AT [facility] Provider Note   CSN: [id number] Arrival date & time: 02/28/24  1712     History  Chief Complaint  Patient presents with   Nausea    COLLEENA KURTENBACH is a 68 y.o. female.  HPI Patient presents for multiple complaints.  Medical history includes HTN, HLD, GERD, CVA, atrial fibrillation, ESRD, CHF, asthma, anemia, depression, pulmonary hypertension.  She undergoes dialysis on Tuesday, Thursday, Saturday. Starting late last night, she developed left lower quadrant/flank pain, nausea, vomiting, diarrhea, headache.  She did undergo dialysis today.  Due to her ongoing discomfort, she came to the ED.  While in the ED waiting room, she developed left-sided chest pain.  Currently, she endorses ongoing pain and nausea.    Home Medications Prior to Admission medications   Medication Sig Start Date End Date Taking? Authorizing Provider  albuterol  (VENTOLIN  HFA) 108 (90 Base) MCG/ACT inhaler Inhale 2 puffs into the lungs every 6 (six) hours as needed (Cough). 01/13/24   Jonelle Neri, DO  amiodarone  (PACERONE ) 200 MG tablet Take 1 tablet (200 mg total) by mouth daily. 11/11/23 05/09/24  Jonelle Neri, DO  atorvastatin  (LIPITOR) 40 MG tablet Take 1 tablet (40 mg total) by mouth daily. 12/16/23   Jonelle Neri, DO  benzonatate  (TESSALON ) 100 MG capsule Take 1 capsule (100 mg total) by mouth 2 (two) times daily. Patient not taking: Reported on 02/22/2024 12/16/23   Jonelle Neri, DO  Calcium  Acetate 667 MG TABS Take 66 mg by mouth 3 (three) times daily with meals.    [provider]  carvedilol  (COREG ) 3.125 MG tablet Take 1 tablet (3.125 mg total) by mouth 2 (two) times daily with a meal. 01/27/24   Jonelle Neri, DO  diclofenac  Sodium (VOLTAREN ) 1 % GEL Apply 4 g topically 4 (four) times daily. 12/09/23   Amoako, Prince, MD  Ensure (ENSURE) Take 237 mLs by mouth 3 (three) times daily between meals.    [provider]  ferrous sulfate 325 (65  FE) MG tablet Take 325 mg by mouth every other day.    [provider]  furosemide  (LASIX ) 80 MG tablet Take 1 tablet (80 mg total) by mouth daily. 05/26/23 01/09/24  Sheree Dieter, MD  hydrALAZINE  (APRESOLINE ) 25 MG tablet Take 2 tablets (50 mg total) by mouth 3 (three) times daily. 02/22/24   Jonelle Neri, DO  HYDROmorphone  (DILAUDID ) 2 MG tablet Take 1 tablet (2 mg total) by mouth 2 (two) times daily as needed for severe pain (pain score 7-10). 02/07/24   Driscilla George, MD  isosorbide  dinitrate (ISORDIL ) 20 MG tablet Take 1 tablet (20 mg total) by mouth 3 (three) times daily. 01/27/24   Jonelle Neri, DO  lidocaine  (LIDODERM ) 5 % Place 1 patch onto the skin daily as needed. Remove & Discard patch within 12 hours or as directed by MD 02/07/24   Cherylene Corrente, MD  losartan  (COZAAR ) 50 MG tablet Take 1 tablet (50 mg total) by mouth daily. 11/11/23 11/10/24  Jonelle Neri, DO  multivitamin (RENA-VIT) TABS tablet Take 1 tablet by mouth daily. 01/27/24   Jonelle Neri, DO  pantoprazole  (PROTONIX ) 40 MG tablet Take 1 tablet (40 mg total) by mouth daily. 11/11/23   Jonelle Neri, DO  polyethylene glycol (MIRALAX  / GLYCOLAX ) 17 g packet Take 17 g by mouth daily as needed for mild constipation or moderate constipation.    [provider]  senna (SENOKOT) 8.6 MG TABS tablet Take 2 tablets by mouth daily.  12/19/23   [provider]  sertraline  (ZOLOFT ) 25 MG tablet Take 1 tablet (25 mg total) by mouth daily. 02/06/24 05/06/24  Jonelle Neri, DO  sevelamer  carbonate (RENVELA ) 800 MG tablet Take 1 tablet (800 mg total) by mouth 3 (three) times daily with meals. 02/09/24   Jonelle Neri, DO  sucroferric oxyhydroxide (VELPHORO ) 500 MG chewable tablet Chew 1 tablet (500 mg total) by mouth 3 (three) times daily with meals. Patient not taking: Reported on 02/22/2024 02/06/24   Jonelle Neri, DO  traZODone  (DESYREL ) 50 MG tablet Take 2 tablets (100 mg total) by mouth at bedtime. 02/06/24 02/05/25  Jonelle Neri, DO   triamcinolone (KENALOG) 0.025 % ointment Apply 1 Application topically daily as needed (shin). 12/13/23   [provider]  promethazine  (PHENERGAN ) 12.5 MG suppository Place 1 suppository (12.5 mg total) rectally every 8 (eight) hours as needed. Patient not taking: Reported on 11/13/2014 09/15/14 06/18/15  Palumbo, April, MD      Allergies    Diphenhydramine  hcl, Lisinopril, Metoclopramide , Nortriptyline, Tramadol, Nitroglycerin , Ibuprofen, Norvasc  [amlodipine  besylate], and Nsaids    Review of Systems   Review of Systems  Cardiovascular:  Positive for chest pain.  Gastrointestinal:  Positive for abdominal pain, diarrhea, nausea and vomiting.  Genitourinary:  Positive for flank pain.  Neurological:  Positive for headaches.  All other systems reviewed and are negative.   Physical Exam Updated Vital Signs BP (!) 182/97   Pulse 67   Temp 98.6 F (37 C) (Oral)   Resp (!) 25   Ht 5\' 6"  (1.676 m)   Wt 70.3 kg   SpO2 100%   BMI 25.02 kg/m  Physical Exam Vitals and nursing note reviewed.  Constitutional:      General: She is not in acute distress.    Appearance: Normal appearance. She is well-developed. She is not ill-appearing, toxic-appearing or diaphoretic.  HENT:     Head: Normocephalic and atraumatic.     Right Ear: External ear normal.     Left Ear: External ear normal.     Nose: Nose normal.     Mouth/Throat:     Mouth: Mucous membranes are moist.  Eyes:     Extraocular Movements: Extraocular movements intact.     Conjunctiva/sclera: Conjunctivae normal.  Cardiovascular:     Rate and Rhythm: Normal rate and regular rhythm.  Pulmonary:     Effort: Pulmonary effort is normal. No respiratory distress.     Breath sounds: Normal breath sounds. No wheezing or rales.  Chest:     Chest wall: Tenderness present.  Abdominal:     General: There is no distension.     Palpations: Abdomen is soft.     Tenderness: There is abdominal tenderness. There is no guarding or  rebound.  Musculoskeletal:        General: No swelling. Normal range of motion.     Cervical back: Normal range of motion and neck supple.  Skin:    General: Skin is warm and dry.     Coloration: Skin is not jaundiced or pale.  Neurological:     General: No focal deficit present.     Mental Status: She is alert and oriented to person, place, and time.  Psychiatric:        Mood and Affect: Mood normal.        Behavior: Behavior normal.     ED Results / Procedures / Treatments   Labs (all labs ordered are listed, but only abnormal results are displayed) Labs  Reviewed  COMPREHENSIVE METABOLIC PANEL WITH GFR - Abnormal; Notable for the following components:      Result Value   Potassium 3.0 (*)    Chloride 97 (*)    Creatinine, Ser 4.61 (*)    Calcium  8.7 (*)    ALT 51 (*)    Alkaline Phosphatase 168 (*)    GFR, Estimated 10 (*)    All other components within normal limits  CBC - Abnormal; Notable for the following components:   RDW 19.0 (*)    Platelets 130 (*)    All other components within normal limits  URINALYSIS, ROUTINE W REFLEX MICROSCOPIC - Abnormal; Notable for the following components:   APPearance HAZY (*)    Hgb urine dipstick SMALL (*)    Protein, ur 100 (*)    Bacteria, UA MANY (*)    All other components within normal limits  BRAIN NATRIURETIC PEPTIDE - Abnormal; Notable for the following components:   B Natriuretic Peptide >4,500.0 (*)    All other components within normal limits  TROPONIN I (HIGH SENSITIVITY) - Abnormal; Notable for the following components:   Troponin I (High Sensitivity) 162 (*)    All other components within normal limits  TROPONIN I (HIGH SENSITIVITY) - Abnormal; Notable for the following components:   Troponin I (High Sensitivity) 152 (*)    All other components within normal limits  LIPASE, BLOOD    EKG EKG Interpretation Date/Time:  Tuesday Feb 28 2024 17:28:32 EDT Ventricular Rate:  63 PR Interval:  162 QRS  Duration:  122 QT Interval:  482 QTC Calculation: 493 R Axis:   -71  Text Interpretation: Sinus rhythm with Premature atrial complexes Possible Left atrial enlargement Left anterior fascicular block Left ventricular hypertrophy with QRS widening ( Cornell product ) Confirmed by Iva Mariner [phone]) on 02/28/2024 7:11:24 PM  Radiology DG Chest Portable 1 View Result Date: 02/28/2024 CLINICAL DATA:  Chest pain. EXAM: PORTABLE CHEST 1 VIEW COMPARISON:  Radiograph and chest CT 02/02/2024, lung bases from abdominal CT earlier today FINDINGS: Cardiomegaly is unchanged from prior exam. Stable mediastinal contours. Vascular congestion is again seen, with improvement in interstitial prominence. No pleural effusion. No pneumothorax. IMPRESSION: Cardiomegaly with vascular congestion. Interstitial prominence has improved from prior exam. Electronically Signed   By: Chadwick Colonel M.D.   On: 02/28/2024 22:20   CT ABDOMEN PELVIS WO CONTRAST Result Date: 02/28/2024 CLINICAL DATA:  Abdominal pain. Nausea, vomiting, diarrhea, headache, left flank pain. EXAM: CT ABDOMEN AND PELVIS WITHOUT CONTRAST TECHNIQUE: Multidetector CT imaging of the abdomen and pelvis was performed following the standard protocol without IV contrast. RADIATION DOSE REDUCTION: This exam was performed according to the departmental dose-optimization program which includes automated exposure control, adjustment of the mA and/or kV according to patient size and/or use of iterative reconstruction technique. COMPARISON:  10/06/2023 FINDINGS: Lower chest: Continued marked cardiomegaly. Hepatobiliary: Faint dependent density in the gallbladder could be from sludge or tiny gallstones. Pancreas: Unremarkable Spleen: Stable nonspecific hypodense splenic lesions, the larger measuring 2.5 cm in long axis on image 15 series 3. Adrenals/Urinary Tract: Fullness of both adrenal glands without mass. Atrophic kidneys. Stomach/Bowel: Nonspecific wall thickening in the  stomach although at least some of this may be from nondistention. Scattered nonspecific air-fluid levels in nondilated small bowel. Vascular/Lymphatic: Atherosclerosis is present, including aortoiliac atherosclerotic disease. 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 Reproductive: Uterus absent.  Adnexa unremarkable. Other: Small amount of perihepatic and perisplenic ascites. Mesenteric edema. Small amount of pelvic ascites. Subcutaneous edema  diffusely. Musculoskeletal: Low position of the anorectal junction compatible with pelvic floor laxity. Lower lumbar spondylosis and degenerative disc disease with bilateral foraminal impingement at L5-S1 and mild right foraminal impingement at L4-5. Avascular necrosis of both femoral heads,, with worsening resorption and collapse of the involved portion of the left femoral head and stable flattening and collapse of the involved portion of the right femoral head. Severe secondary osteoarthritis of both hips. IMPRESSION: 1. Marked cardiomegaly. 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 2. Small amount of ascites. Mesenteric edema. Subcutaneous edema diffusely. Appearance favors third spacing of fluids. 3. Nonspecific wall thickening in the stomach although at least some of this may be from nondistention. Scattered nonspecific air-fluid levels in nondilated small bowel. 4. Avascular necrosis of both femoral heads, with worsening resorption and collapse of the involved portion of the left femoral head and stable flattening and collapse of the involved portion of the right femoral head. Severe secondary osteoarthritis of both hips. 5. Lower lumbar spondylosis and degenerative disc disease with bilateral foraminal impingement at L5-S1 and mild right foraminal impingement at L4-5. 6. Faint dependent density in the gallbladder could be from sludge or tiny gallstones. 7. Stable nonspecific hypodense splenic lesions, the larger measuring 2.5 cm in long axis. 8.   Aortic Atherosclerosis (ICD10-I70.0). Electronically Signed   By: Freida Jes M.D.   On: 02/28/2024 19:54    Procedures Procedures    Medications Ordered in ED Medications  carvedilol  (COREG ) tablet 3.125 mg (3.125 mg Oral Given 02/28/24 2229)  hydrALAZINE  (APRESOLINE ) tablet 50 mg (50 mg Oral Given 02/28/24 2230)  isosorbide  dinitrate (ISORDIL ) tablet 20 mg (20 mg Oral Given 02/28/24 2227)  losartan  (COZAAR ) tablet 50 mg (50 mg Oral Patient Refused/Not Given 02/28/24 2232)  HYDROmorphone  (DILAUDID ) injection 0.5 mg (has no administration in time range)  alum & mag hydroxide-simeth (MAALOX/MYLANTA) 200-200-20 MG/5ML suspension 30 mL (has no administration in time range)    And  lidocaine  (XYLOCAINE ) 2 % viscous mouth solution 15 mL (has no administration in time range)  famotidine  (PEPCID ) IVPB 20 mg premix (has no administration in time range)  oxyCODONE  (Oxy IR/ROXICODONE ) immediate release tablet 5 mg (5 mg Oral Given 02/28/24 1938)  ondansetron  (ZOFRAN -ODT) disintegrating tablet 4 mg (4 mg Oral Given 02/28/24 1938)  oxyCODONE -acetaminophen  (PERCOCET/ROXICET) 5-325 MG per tablet 1 tablet (1 tablet Oral Given 02/28/24 2144)  ondansetron  (ZOFRAN -ODT) disintegrating tablet 4 mg (4 mg Oral Given 02/28/24 2144)    ED Course/ Medical Decision Making/ A&P                                 Medical Decision Making Amount and/or Complexity of Data Reviewed Labs: ordered. Radiology: ordered.  Risk OTC drugs. Prescription drug management.   This patient presents to the ED for concern of left sided chest and flank pain, this involves an extensive number of treatment options, and is a complaint that carries with it a high risk of complications and morbidity.  The differential diagnosis includes pneumonia, MSK injury, pleural effusion, CHF, nephrolithiasis, retroperitoneal hemorrhage, bowel obstruction, colitis   Co morbidities / Chronic conditions that complicate the patient  evaluation  HTN, HLD, GERD, CVA, atrial fibrillation, ESRD, CHF, asthma, anemia, depression, pulmonary hypertension   Additional history obtained:  Additional history obtained from EMR External records from outside source obtained and reviewed including N/A   Lab Tests:  I Ordered, and personally interpreted labs.  The pertinent results include:  Elevated BNP and troponin consistent with CHF exacerbation.    Imaging Studies ordered:  I ordered imaging studies including CXR, CT of abdomen and pelvis  I independently visualized and interpreted imaging which showed cardiomegaly, anasarca, pulmonary vascular congestion I agree with the radiologist interpretation   Cardiac Monitoring: / EKG:  The patient was maintained on a cardiac monitor.  I personally viewed and interpreted the cardiac monitored which showed an underlying rhythm of: sinus rhythm   Problem List / ED Course / Critical interventions / Medication management  Patient presenting for multiple complaints.  She developed left-sided truncal pain last night and this has continued today.  She describes nausea, vomiting, and diarrhea prior to arrival.  On arrival, vital signs notable for hypertension.  On exam, patient is alert and she has no focal neurologic deficits.  She does have left sided abdominal and chest tenderness.  Oxycodone  and Zofran  ordered for symptomatic relief.  Lab work shows hypokalemia with otherwise normal electrolytes.  Given ESRD, will hold off on replacing potassium.  She has no leukocytosis.  Troponin is elevated but does appear to be consistent with prior lab work.  Urinalysis is contaminated by the squamous epithelial cells.  There is no evidence of RBCs to suggest nephrolithiasis.  Patient underwent CT imaging of abdomen and pelvis.  This does not appear to show any acute findings.  She has cardiomegaly, prominent IVC, small volume ascites, mesenteric anemia, subcutaneous edema, bilateral femoral head  avascular necrosis, and faint dependent density in gallbladder.  She has some hypodense splenic lesions which do appear stable when compared to prior imaging.  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  This is in the region of her discomfort but unlikely the cause given the chronicity of this finding.  On reassessment, patient still endorses pain.  She is prescribed Dilaudid , 2 mg twice daily as needed.  She states that she typically does take this medication every day.  There may be a withdrawal component to her symptoms.  Dilaudid  was ordered.  While in the ED, patient did have some mild hypoxia.  She was placed on 2 L of supplemental oxygen.  Chest x-ray and BNP were ordered.  Her blood pressure remains elevated.  Home blood pressure medications were ordered.  Second opponent showed slight downtrend.  GI cocktail was ordered for possible GERD etiology of her symptoms. Chest x-ray shows cardiomegaly with vascular congestion. BNP markedly elevated. Patient still endorsing pain. She describes it now as pain along her L lower ribs and attributes it to coughing. She remains on 2L supplemental oxygen. I suspect this is from CHF exacerbation. Patient to be admitted for further management.  I ordered medication including percocet and dilaudid  for analgesia, zofran  for nausea, home blood pressure medications for hypertension.     Reevaluation of the patient after these medicines showed that the patient improved I have reviewed the patients home medicines and have made adjustments as needed   Social Determinants of Health:  Has PCP  CRITICAL CARE Performed by: Iva Mariner   Total critical care time: 34 minutes  Critical care time was exclusive of separately billable procedures and treating other patients.  Critical care was necessary to treat or prevent imminent or life-threatening deterioration.  Critical care was time spent personally by me on the following activities: development of  treatment plan with patient and/or surrogate as well as nursing, discussions with consultants, evaluation of patient's response to treatment, examination of patient, obtaining history from patient or surrogate, ordering  and performing treatments and interventions, ordering and review of laboratory studies, ordering and review of radiographic studies, pulse oximetry and re-evaluation of patient's condition.        Final Clinical Impression(s) / ED Diagnoses Final diagnoses:  Acute on chronic congestive heart failure, unspecified heart failure type (HCC)  Acute respiratory failure with hypoxia [facility])    Rx / DC Orders ED Discharge Orders     None         Iva Mariner, MD 02/28/24 [phone]

## 2024-02-28 NOTE — ED Provider Triage Note (Signed)
 Emergency Medicine Provider Triage Evaluation Note  Audrey Hill , a 68 y.o. female  was evaluated in triage.  Pt complains of left lower quadrant abdominal pain with nausea vomiting and diarrhea that started last midnight..  Review of Systems  Positive: As above Negative: Fever  Physical Exam  BP (!) 182/90 (BP Location: Right Arm)   Pulse 61   Temp 98.8 F (37.1 C) (Oral)   Resp 17   Ht 5\' 6"  (1.676 m)   Wt 70.3 kg   SpO2 92%   BMI 25.02 kg/m  Gen:   Awake, no distress   Resp:  Normal effort  MSK:   Moves extremities without difficulty  Other:    Medical Decision Making  Medically screening exam initiated at 6:24 PM.  Appropriate orders placed.  TANVI GATLING was informed that the remainder of the evaluation will be completed by another provider, this initial triage assessment does not replace that evaluation, and the importance of remaining in the ED until their evaluation is complete.     Aimee Houseman, [place] 02/28/24 1824

## 2024-02-28 NOTE — ED Triage Notes (Addendum)
 PER EMS: pt arrives from dialysis with c/o nausea, vomiting, diarrhea, headache and left flank pain x 1 day. No vomiting with ems. She did receive her full dialysis treatment. Dialysis Tu-Thur-Sat.  BP- 150/74, HR-68, 96% RA, CBG-111  Edit to add, pt is now reporting left sided chest pain.

## 2024-02-28 NOTE — Hospital Course (Addendum)
 Patient Summary: Audrey Hill is a 68 year old woman with a complex past medical history including ESRD on HD TThSat, GAVE, HFrEF, and polypharmacy who presented to the ED with a productive cough, dyspnea and n/v and is admitted for acute HFrEF exacerbation.   HFrEF (EF 30-35%) Dyspnea She presented with some evidence of volume overload and was diuresed with IV Lasix . BNP >4500. After 1 day of IV diuresis, her standing weight was 143 lbs, which is below her reported dry weight of 145, which may not be fully accurate, nephro may adjust dry weight.  Her symptoms resolved and exam improved. Stable on home 2L [place]. D/c weight was 62.6 kg (139 lbs). Resumed her home lasix  80 mg daily along with HD per nephro.    Dysphagia Cough She noted that she has started coughing when she eats or drinks in the past few weeks, which is new, so we had the speech-language pathologist and evaluate her. MBS notes normal oropharyngeal swallow and clearance. Has prior hx of esophageal dysmotility. Was able to tolerate renal diet without issue prior to discharge. May benefit from outpatient follow up.     Left flank pain Epigastric pain Hx of AVN b/l hip Unclear etiology, but seems most likely musculoskeletal in nature given tenderness to palpation and her reported history. Suspect she may also have a gastritis given epigastric pain and thickened stomach wall on imaging.  No skin lesions or rashes present over tender areas.  Chest x-ray on presentation did not show any abnormalities in left lung, but obstructed by cardiomegaly. It is possible that she could have a viral pneumonia or gastroenteritis, which should self-resolve. She has some chronic pain and follows with a pain management specialist, though this pain seems to normally be in her neck, back and hips rather than flank. She was given Robaxin  along with Lidocaine  patch, voltaren  gel and PRN tylenol . Pain stable/not worsening and patient has f/u with her pain  specialist.    ESRD on HD, TThSat Continued HD during hospital course. RFP stable. Continue home nephro medications. Last inpatient HD was 5/31.   Chronic conditions: Afib, not on AC: Hemodynamically stable; continued amiodarone  200 mg daily Chronic pain syndrome/greater trochanteric bursitis: Follows with a pain management specialist. Continued tylenol , home dilaudid  2 mg BID. Added lidocaine  patches, voltaren .  HTN: Continued home hydralazine  25 mg TID, isosorbide  dinitrate 20 mg TID, and losartan  50 mg daily. Stopped home Coreg  due to bradycardia, can f/u outpatient for restart.  HLD: Continue home lipitor 40 mg daily GERD: Continue home protonix  40 mg daily Depression: Continue home sertraline  25 mg daily Insomnia: Continue home trazodone  50 mg daily at bedtime History of GAVE/External hemorrhoids: Had initially loose stools but gradually resolved. No worsening signs/symptoms. Hgb at baseline of 10.8.

## 2024-02-28 NOTE — ED Notes (Signed)
 Celeste, PA at bedside to [facility] the patient.

## 2024-02-29 ENCOUNTER — Encounter (HOSPITAL_COMMUNITY): Payer: Self-pay | Admitting: Internal Medicine

## 2024-02-29 ENCOUNTER — Ambulatory Visit

## 2024-02-29 DIAGNOSIS — E876 Hypokalemia: Secondary | ICD-10-CM | POA: Diagnosis present

## 2024-02-29 DIAGNOSIS — I132 Hypertensive heart and chronic kidney disease with heart failure and with stage 5 chronic kidney disease, or end stage renal disease: Secondary | ICD-10-CM | POA: Diagnosis present

## 2024-02-29 DIAGNOSIS — M879 Osteonecrosis, unspecified: Secondary | ICD-10-CM | POA: Diagnosis present

## 2024-02-29 DIAGNOSIS — K219 Gastro-esophageal reflux disease without esophagitis: Secondary | ICD-10-CM | POA: Diagnosis present

## 2024-02-29 DIAGNOSIS — E785 Hyperlipidemia, unspecified: Secondary | ICD-10-CM | POA: Diagnosis present

## 2024-02-29 DIAGNOSIS — I5023 Acute on chronic systolic (congestive) heart failure: Secondary | ICD-10-CM | POA: Diagnosis present

## 2024-02-29 DIAGNOSIS — Z992 Dependence on renal dialysis: Secondary | ICD-10-CM

## 2024-02-29 DIAGNOSIS — J45909 Unspecified asthma, uncomplicated: Secondary | ICD-10-CM | POA: Diagnosis present

## 2024-02-29 DIAGNOSIS — F32A Depression, unspecified: Secondary | ICD-10-CM | POA: Diagnosis present

## 2024-02-29 DIAGNOSIS — I480 Paroxysmal atrial fibrillation: Secondary | ICD-10-CM | POA: Diagnosis present

## 2024-02-29 DIAGNOSIS — Z8616 Personal history of COVID-19: Secondary | ICD-10-CM | POA: Diagnosis not present

## 2024-02-29 DIAGNOSIS — Z96652 Presence of left artificial knee joint: Secondary | ICD-10-CM | POA: Diagnosis present

## 2024-02-29 DIAGNOSIS — I272 Pulmonary hypertension, unspecified: Secondary | ICD-10-CM | POA: Diagnosis present

## 2024-02-29 DIAGNOSIS — R112 Nausea with vomiting, unspecified: Secondary | ICD-10-CM | POA: Diagnosis present

## 2024-02-29 DIAGNOSIS — R188 Other ascites: Secondary | ICD-10-CM | POA: Diagnosis present

## 2024-02-29 DIAGNOSIS — I509 Heart failure, unspecified: Secondary | ICD-10-CM

## 2024-02-29 DIAGNOSIS — D696 Thrombocytopenia, unspecified: Secondary | ICD-10-CM | POA: Diagnosis present

## 2024-02-29 DIAGNOSIS — G894 Chronic pain syndrome: Secondary | ICD-10-CM | POA: Diagnosis present

## 2024-02-29 DIAGNOSIS — D631 Anemia in chronic kidney disease: Secondary | ICD-10-CM | POA: Diagnosis present

## 2024-02-29 DIAGNOSIS — D72819 Decreased white blood cell count, unspecified: Secondary | ICD-10-CM | POA: Diagnosis present

## 2024-02-29 DIAGNOSIS — N186 End stage renal disease: Secondary | ICD-10-CM | POA: Diagnosis present

## 2024-02-29 DIAGNOSIS — J9601 Acute respiratory failure with hypoxia: Secondary | ICD-10-CM | POA: Diagnosis not present

## 2024-02-29 DIAGNOSIS — I429 Cardiomyopathy, unspecified: Secondary | ICD-10-CM | POA: Diagnosis present

## 2024-02-29 DIAGNOSIS — G4700 Insomnia, unspecified: Secondary | ICD-10-CM | POA: Diagnosis present

## 2024-02-29 DIAGNOSIS — Z1152 Encounter for screening for COVID-19: Secondary | ICD-10-CM | POA: Diagnosis not present

## 2024-02-29 DIAGNOSIS — N2581 Secondary hyperparathyroidism of renal origin: Secondary | ICD-10-CM | POA: Diagnosis present

## 2024-02-29 DIAGNOSIS — R0902 Hypoxemia: Secondary | ICD-10-CM | POA: Diagnosis present

## 2024-02-29 LAB — CBC
HCT: 34.3 % — ABNORMAL LOW (ref 36.0–46.0)
Hemoglobin: 10.9 g/dL — ABNORMAL LOW (ref 12.0–15.0)
MCH: 26.5 pg (ref 26.0–34.0)
MCHC: 31.8 g/dL (ref 30.0–36.0)
MCV: 83.3 fL (ref 80.0–100.0)
Platelets: 141 10*3/uL — ABNORMAL LOW (ref 150–400)
RBC: 4.12 MIL/uL (ref 3.87–5.11)
RDW: 18.7 % — ABNORMAL HIGH (ref 11.5–15.5)
WBC: 3.7 10*3/uL — ABNORMAL LOW (ref 4.0–10.5)
nRBC: 0 % (ref 0.0–0.2)

## 2024-02-29 LAB — URINALYSIS, ROUTINE W REFLEX MICROSCOPIC
Bilirubin Urine: NEGATIVE
Glucose, UA: NEGATIVE mg/dL
Ketones, ur: NEGATIVE mg/dL
Leukocytes,Ua: NEGATIVE
Nitrite: NEGATIVE
Protein, ur: 100 mg/dL — AB
Specific Gravity, Urine: 1.004 — ABNORMAL LOW (ref 1.005–1.030)
pH: 7 (ref 5.0–8.0)

## 2024-02-29 LAB — RENAL FUNCTION PANEL
Albumin: 3.1 g/dL — ABNORMAL LOW (ref 3.5–5.0)
Anion gap: 13 (ref 5–15)
BUN: 20 mg/dL (ref 8–23)
CO2: 28 mmol/L (ref 22–32)
Calcium: 8.9 mg/dL (ref 8.9–10.3)
Chloride: 97 mmol/L — ABNORMAL LOW (ref 98–111)
Creatinine, Ser: 5.74 mg/dL — ABNORMAL HIGH (ref 0.44–1.00)
GFR, Estimated: 7 mL/min — ABNORMAL LOW (ref >60)
Glucose, Bld: 58 mg/dL — ABNORMAL LOW (ref 70–99)
Phosphorus: 5.4 mg/dL — ABNORMAL HIGH (ref 2.5–4.6)
Potassium: 3.6 mmol/L (ref 3.5–5.1)
Sodium: 138 mmol/L (ref 135–145)

## 2024-02-29 LAB — COMPREHENSIVE METABOLIC PANEL WITH GFR
ALT: 45 U/L — ABNORMAL HIGH (ref 0–44)
AST: 37 U/L (ref 15–41)
Albumin: 3.1 g/dL — ABNORMAL LOW (ref 3.5–5.0)
Alkaline Phosphatase: 141 U/L — ABNORMAL HIGH (ref 38–126)
Anion gap: 12 (ref 5–15)
BUN: 18 mg/dL (ref 8–23)
CO2: 28 mmol/L (ref 22–32)
Calcium: 8.8 mg/dL — ABNORMAL LOW (ref 8.9–10.3)
Chloride: 96 mmol/L — ABNORMAL LOW (ref 98–111)
Creatinine, Ser: 5.46 mg/dL — ABNORMAL HIGH (ref 0.44–1.00)
GFR, Estimated: 8 mL/min — ABNORMAL LOW (ref >60)
Glucose, Bld: 73 mg/dL (ref 70–99)
Potassium: 3.1 mmol/L — ABNORMAL LOW (ref 3.5–5.1)
Sodium: 136 mmol/L (ref 135–145)
Total Bilirubin: 0.9 mg/dL (ref 0.0–1.2)
Total Protein: 6.9 g/dL (ref 6.5–8.1)

## 2024-02-29 LAB — RESP PANEL BY RT-PCR (RSV, FLU A&B, COVID)  RVPGX2
Influenza A by PCR: NEGATIVE
Influenza B by PCR: NEGATIVE
Resp Syncytial Virus by PCR: NEGATIVE
SARS Coronavirus 2 by RT PCR: NEGATIVE

## 2024-02-29 LAB — HIV ANTIBODY (ROUTINE TESTING W REFLEX): HIV Screen 4th Generation wRfx: NONREACTIVE

## 2024-02-29 LAB — CBG MONITORING, ED: Glucose-Capillary: 84 mg/dL (ref 70–99)

## 2024-02-29 LAB — MRSA NEXT GEN BY PCR, NASAL: MRSA by PCR Next Gen: NOT DETECTED

## 2024-02-29 LAB — MAGNESIUM: Magnesium: 2 mg/dL (ref 1.7–2.4)

## 2024-02-29 LAB — HEPATITIS B SURFACE ANTIGEN: Hepatitis B Surface Ag: NONREACTIVE

## 2024-02-29 MED ORDER — ATORVASTATIN CALCIUM 40 MG PO TABS: 40.0000 mg | ORAL_TABLET | Freq: Every day | ORAL | Status: DC

## 2024-02-29 MED ORDER — HYDROMORPHONE HCL 2 MG PO TABS
2.0000 mg | ORAL_TABLET | Freq: Two times a day (BID) | ORAL | Status: DC
  Administered 2024-02-29 – 2024-03-04 (×10): 2 mg via ORAL
  Filled 2024-02-29 (×9): qty 1

## 2024-02-29 MED ORDER — HEPARIN SODIUM (PORCINE) 5000 UNIT/ML IJ SOLN
5000.0000 [IU] | Freq: Three times a day (TID) | INTRAMUSCULAR | Status: DC
  Administered 2024-02-29 – 2024-03-04 (×12): 5000 [IU] via SUBCUTANEOUS
  Filled 2024-02-29 (×12): qty 1

## 2024-02-29 MED ORDER — HYDRALAZINE HCL 25 MG PO TABS
25.0000 mg | ORAL_TABLET | Freq: Three times a day (TID) | ORAL | Status: DC
  Administered 2024-02-29 – 2024-03-04 (×12): 25 mg via ORAL
  Filled 2024-02-29 (×13): qty 1

## 2024-02-29 MED ORDER — LIDOCAINE 5 % EX PTCH
1.0000 | MEDICATED_PATCH | Freq: Every day | CUTANEOUS | Status: DC | PRN
  Administered 2024-02-29 – 2024-03-03 (×3): 1 via TRANSDERMAL
  Filled 2024-02-29 (×6): qty 1

## 2024-02-29 MED ORDER — RENA-VITE PO TABS: 1.0000 | ORAL_TABLET | Freq: Every day | ORAL | Status: DC

## 2024-02-29 MED ORDER — DICLOFENAC SODIUM 1 % EX GEL
4.0000 g | Freq: Four times a day (QID) | CUTANEOUS | Status: DC
  Administered 2024-02-29 – 2024-03-03 (×12): 4 g via TOPICAL
  Filled 2024-02-29 (×2): qty 100

## 2024-02-29 MED ORDER — PANTOPRAZOLE SODIUM 40 MG PO TBEC: 40.0000 mg | DELAYED_RELEASE_TABLET | Freq: Every day | ORAL | Status: DC

## 2024-02-29 MED ORDER — PANTOPRAZOLE SODIUM 40 MG PO TBEC
40.0000 mg | DELAYED_RELEASE_TABLET | Freq: Every day | ORAL | Status: DC
  Administered 2024-02-29 – 2024-03-04 (×4): 40 mg via ORAL
  Filled 2024-02-29 (×4): qty 1

## 2024-02-29 MED ORDER — POTASSIUM CHLORIDE 20 MEQ PO PACK
20.0000 meq | PACK | Freq: Once | ORAL | Status: AC
  Administered 2024-02-29: 20 meq via ORAL
  Filled 2024-02-29: qty 1

## 2024-02-29 MED ORDER — FUROSEMIDE 10 MG/ML IJ SOLN
80.0000 mg | Freq: Once | INTRAMUSCULAR | Status: AC
  Administered 2024-02-29: 80 mg via INTRAVENOUS
  Filled 2024-02-29: qty 8

## 2024-02-29 MED ORDER — GUAIFENESIN ER 600 MG PO TB12
600.0000 mg | ORAL_TABLET | Freq: Two times a day (BID) | ORAL | Status: DC
  Administered 2024-02-29 – 2024-03-04 (×7): 600 mg via ORAL
  Filled 2024-02-29 (×7): qty 1

## 2024-02-29 MED ORDER — CHLORHEXIDINE GLUCONATE CLOTH 2 % EX PADS
6.0000 | MEDICATED_PAD | Freq: Every day | CUTANEOUS | Status: DC
  Administered 2024-03-01 – 2024-03-04 (×3): 6 via TOPICAL

## 2024-02-29 MED ORDER — RENA-VITE PO TABS
1.0000 | ORAL_TABLET | Freq: Every day | ORAL | Status: DC
  Administered 2024-02-29 – 2024-03-03 (×4): 1 via ORAL
  Filled 2024-02-29 (×5): qty 1

## 2024-02-29 MED ORDER — ALBUTEROL SULFATE (2.5 MG/3ML) 0.083% IN NEBU: 3.0000 mL | INHALATION_SOLUTION | Freq: Four times a day (QID) | RESPIRATORY_TRACT | Status: DC | PRN

## 2024-02-29 MED ORDER — ATORVASTATIN CALCIUM 40 MG PO TABS
40.0000 mg | ORAL_TABLET | Freq: Every day | ORAL | Status: DC
  Administered 2024-02-29 – 2024-03-04 (×4): 40 mg via ORAL
  Filled 2024-02-29 (×4): qty 1

## 2024-02-29 MED ORDER — TRAZODONE HCL 50 MG PO TABS
50.0000 mg | ORAL_TABLET | Freq: Every day | ORAL | Status: DC
  Administered 2024-02-29 – 2024-03-03 (×5): 50 mg via ORAL
  Filled 2024-02-29 (×5): qty 1

## 2024-02-29 MED ORDER — SERTRALINE HCL 50 MG PO TABS
25.0000 mg | ORAL_TABLET | Freq: Every day | ORAL | Status: DC
  Administered 2024-02-29 – 2024-03-04 (×4): 25 mg via ORAL
  Filled 2024-02-29 (×4): qty 1

## 2024-02-29 MED ORDER — SERTRALINE HCL 50 MG PO TABS: 25.0000 mg | ORAL_TABLET | Freq: Every day | ORAL | Status: DC

## 2024-02-29 MED ORDER — AMIODARONE HCL 200 MG PO TABS
200.0000 mg | ORAL_TABLET | Freq: Every day | ORAL | Status: DC
  Administered 2024-02-29 – 2024-03-04 (×4): 200 mg via ORAL
  Filled 2024-02-29 (×4): qty 1

## 2024-02-29 NOTE — ED Notes (Signed)
 Pt assisted to and from the bedpan. Unable to measure urine output due to BM. Unable to collect a stool sample due to mix with urine.

## 2024-02-29 NOTE — Plan of Care (Signed)
  Problem: Activity: Goal: Risk for activity intolerance will decrease Outcome: Progressing   Problem: Pain Managment: Goal: General experience of comfort will improve and/or be controlled Outcome: Progressing   Problem: Safety: Goal: Ability to remain free from injury will improve Outcome: Progressing   Problem: Skin Integrity: Goal: Risk for impaired skin integrity will decrease Outcome: Progressing
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
## 2024-02-29 NOTE — Progress Notes (Signed)
 Pt arrived to 6e27 via bed from the ED. See assessment.

## 2024-02-29 NOTE — ED Notes (Addendum)
 Micro Lab called, stool specimen not enough, need to be recollected. Light green sent straight stick for redraw of Renal Function Panel

## 2024-02-29 NOTE — ED Notes (Signed)
 Pt assisted to bedside commode

## 2024-02-29 NOTE — ED Notes (Signed)
 Pt oxygen decreased from 1L to 0.5L

## 2024-02-29 NOTE — Progress Notes (Addendum)
 Overnight events: None  Subjective: Feels poorly this morning. Still has a cough, reports greenish sputum. Down to 1L [place]. Main concern is flank pain and leg pain.   Objective:  Vital signs in last 24 hours: Vitals:   02/29/24 1045 02/29/24 1100 02/29/24 1110 02/29/24 1155  BP:      Pulse: (!) 56 (!) 55    Resp: 18 (!) 24    Temp:    98.4 F (36.9 C)  TempSrc:    Oral  SpO2: (!) 89% (!) 87% 91%   Weight:      Height:        Physical Exam: Constitutional: Chronically ill appearing older female, laying in bed, on 1L [place], in no acute distress Cardio: Bradycardia, no murmurs; JVD appreciated with hepatojugular reflex; no LE edema, but may have mild abdominal edema Pulm: Crackles in left lung base, otherwise CTA bilaterally; normal repiratory effort, increased respiratory rate, no accessory muscle use, on 1L [place] Abdomen: Soft, nontender, nondistended Skin: No lesions or skin changes Neuro: Alert and oriented x 3, no focal neurological deficits Psych: Normal mood and affect  Labs: Glucose 81 Cr 5.74, BUN 20, Phos 5.4 HIV non reactive Flu/Covid negative Mag 2.0 Leukopenia 3.7 Hgb 10.9 Plt 141  Assessment/Plan: Principal Problem:   Acute on chronic heart failure (HCC) Active Problems:   ESRD on dialysis (HCC)   Acute on chronic HFrEF (heart failure with reduced ejection fraction) (HCC)  Patient Summary: Audrey Hill is a 68 year old woman with a complex past medical history including ESRD on HD TThSat, GAVE, HFrEF, and polypharmacy who presented to the ED with a productive cough, dyspnea and n/v and is admitted for acute HFrEF exacerbation on hospital day 1.   HFrEF (EF 30-35%) Dyspnea Cough She appears mild to moderately volume overloaded on exam with left lung base crackles and JVD. CXR with vascular congestion, BNP >4500. Weight is up around 9 pounds from dry weight of 145 lbs. NM cardiac spect study on 5/19 showed severe diffuse hypokinesis and a LVEF of 32%.  Received 80 mg IV lasix  so far, no urine output recorded yet. Has not missed any dialysis. Unclear etiology of exacerbation, perhaps viral infection given respiratory and gastrointestinal symptoms versus dietary indiscretion versus poor outpatient diuretic adherence.  - Strict I's & O's; will follow urine output and redose lasix  as indicated - Will get her back on HD schedule; perhaps extra fluid can be pulled during HD for volume management - PT/OT eval - Daily weights - Ambulatory pulse ox - Cardiac telemetry  ESRD on HD, TThSat Has not missed any recent sessions. We will touch base with nephrology about getting her on HD schedule while hospitalized.  - Nephro consulted, appreciate it - Continue rena-vit - Holding renvela  given diarrhea - Renal diet  Diarrhea History of GAVE External hemorrhoids Reportedly had 4-5 episodes of diarrhea for the past few days with a little bleeding. Hgb stable, guaiac negative on admission. No abdominal pain. No concerns for bacterial infection at this time, but could be a viral gastroenteritis. Will hold off on GI panel for now and monitor with strict I's and O's. If diarrhea persists, we can expand our workup, but for now we will just monitor. - daily CBC - monitor for stool output, quality, bleeding  Chronic conditions: Afib, not on AC: Hemodynamically stable; holding amiodarone  200 mg daily Chronic pain syndrome/greater trochanteric bursitis: Follows with a pain management specialist. Continued home dilaudid  2 mg BID. Added lidocaine  patches, voltaren .  HTN: BP  remains elevated. Continue home hydralazine  25 mg q8H, isosorbide  dinitrate 20 mg TID, and losartan  50 mg daily. HLD: Continue lipitor 40 mg daily GERD: Continue home protonix  40 mg daily Depression: Continue Sertraline  25 mg daily Insomnia: Continue trazodone  50 mg daily at bedtime  Diet: Renal IVF: None VTE: Heparin  Code: Full   Dispo: Anticipated discharge to  Home vs SNF  pending  medical stability and safe dispo plan.   Dorthy Gavia, MD 02/29/2024, 12:00 PM After 5pm on weekdays and 1pm on weekends: On Call pager [phone]
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
## 2024-02-29 NOTE — Progress Notes (Signed)
 Physical Therapy Evaluation Patient Details Name: Audrey Hill MRN: [id number] DOB: [birth year] Today's Date: 02/29/2024  History of Present Illness  Pt is 68 year old presented to [facility] on  02/28/24 for fluid overload due to chf exacerbation. PMH - CVA, chf, esrd on hd, depression atrial fibrillation, chronic pain and malnutrition, seizures, GERD.  Clinical Impression  Pt admitted with above diagnosis and presents to PT with functional limitations due to deficits listed below (See PT problem list). Pt needs skilled PT to maximize independence and safety. Expect pt will make steady progress and hopes to return home with home health.           If plan is discharge home, recommend the following: A little help with walking and/or transfers;A little help with bathing/dressing/bathroom;Assist for transportation   Can travel by private vehicle        Equipment Recommendations None recommended by PT  Recommendations for Other Services       Functional Status Assessment Patient has had a recent decline in their functional status and demonstrates the ability to make significant improvements in function in a reasonable and predictable amount of time.     Precautions / Restrictions Precautions Precautions: Fall Restrictions Weight Bearing Restrictions Per Provider Order: No      Mobility  Bed Mobility Overal bed mobility: Needs Assistance Bed Mobility: Supine to Sit, Sit to Supine     Supine to sit: Supervision, HOB elevated Sit to supine: Supervision        Transfers Overall transfer level: Needs assistance Equipment used: Rollator (4 wheels) Transfers: Sit to/from Stand Sit to Stand: Min assist           General transfer comment: Assist to power up.    Ambulation/Gait Ambulation/Gait assistance: Contact guard assist, Min assist Gait Distance (Feet): 70 Feet (x 2) Assistive device: Rollator (4 wheels) Gait Pattern/deviations: Step-through pattern, Decreased  stride length, Knees buckling Gait velocity: decr Gait velocity interpretation: <1.31 ft/sec, indicative of household ambulator   General Gait Details: Slight buckling of knees with incr distance but pt able to recover. Pt with heavy reliance on UE's  Stairs            Wheelchair Mobility     Tilt Bed    Modified Rankin (Stroke Patients Only)       Balance Overall balance assessment: Needs assistance Sitting-balance support: No upper extremity supported, Feet supported Sitting balance-Leahy Scale: Good     Standing balance support: Bilateral upper extremity supported Standing balance-Leahy Scale: Poor Standing balance comment: UE support and CGA for static standing                             Pertinent Vitals/Pain Pain Assessment Pain Assessment: Faces Faces Pain Scale: Hurts little more Pain Location: lt flank Pain Descriptors / Indicators: Grimacing, Guarding Pain Intervention(s): Limited activity within patient's tolerance, Monitored during session, Repositioned    Home Living Family/patient expects to be discharged to:: Private residence Living Arrangements: Other relatives Available Help at Discharge: Family;Available PRN/intermittently Type of Home: Apartment Home Access: Level entry       Home Layout: One level Home Equipment: Agricultural consultant (2 wheels);Cane - single point;Rollator (4 wheels)      Prior Function Prior Level of Function : Independent/Modified Independent;Needs assist             Mobility Comments: Modified independent with rollator ADLs Comments: Modified independent. Fatigue with cooking. Family assist with shopping  Extremity/Trunk Assessment   Upper Extremity Assessment Upper Extremity Assessment: Defer to OT evaluation    Lower Extremity Assessment Lower Extremity Assessment: Generalized weakness       Communication   Communication Communication: No apparent difficulties    Cognition Arousal:  Alert Behavior During Therapy: WFL for tasks assessed/performed   PT - Cognitive impairments: No apparent impairments                         Following commands: Intact       Cueing Cueing Techniques: Verbal cues     General Comments General comments (skin integrity, edema, etc.): VSS on RA. Spo2 90% or greater on RA    Exercises     Assessment/Plan    PT Assessment Patient needs continued PT services  PT Problem List Decreased strength;Decreased activity tolerance;Decreased balance;Decreased mobility       PT Treatment Interventions DME instruction;Gait training;Functional mobility training;Therapeutic activities;Therapeutic exercise;Balance training;Patient/family education    PT Goals (Current goals can be found in the Care Plan section)  Acute Rehab PT Goals Patient Stated Goal: go home PT Goal Formulation: With patient Time For Goal Achievement: 03/14/24 Potential to Achieve Goals: Good    Frequency Min 2X/week     Co-evaluation               AM-PAC PT "6 Clicks" Mobility  Outcome Measure Help needed turning from your back to your side while in a flat bed without using bedrails?: None Help needed moving from lying on your back to sitting on the side of a flat bed without using bedrails?: None Help needed moving to and from a bed to a chair (including a wheelchair)?: A Little Help needed standing up from a chair using your arms (e.g., wheelchair or bedside chair)?: A Little Help needed to walk in hospital room?: A Little Help needed climbing 3-5 steps with a railing? : A Little 6 Click Score: 20    End of Session Equipment Utilized During Treatment: Gait belt Activity Tolerance: Patient tolerated treatment well Patient left: in bed;with call bell/phone within reach Nurse Communication: Mobility status;Other (comment) (left O2 off) PT Visit Diagnosis: Unsteadiness on feet (R26.81);Other abnormalities of gait and mobility (R26.89);Muscle  weakness (generalized) (M62.81)    Time: 1017-1030 PT Time Calculation (min) (ACUTE ONLY): 13 min   Charges:   PT Evaluation $PT Eval Moderate Complexity: 1 Mod   PT General Charges $$ ACUTE PT VISIT: 1 Visit         [facility] PT Acute Rehabilitation Services Office [phone]   Pura Browns [facility] 02/29/2024, 11:28 AM

## 2024-02-29 NOTE — Evaluation (Signed)
 Occupational Therapy Evaluation Patient Details Name: Audrey Hill MRN: [id number] DOB: November 17, 1955 Today's Date: 02/29/2024   History of Present Illness   Pt is 68 year old presented to [facility] on  02/28/24 for fluid overload due to chf exacerbation. PMH - CVA, chf, esrd on hd, depression atrial fibrillation, chronic pain and malnutrition, seizures, GERD.     Clinical Impressions Pt typically lives in her house with her grandson who assists PRN, she sponge bathes at baseline and uses Rollator as primary DME. She reports that IADL have become harder and harder for her. Today she is able to demonstrate bed mobility at supervision level with extra time, get her shoes on, perform toilet transfer and peri care. She demonstrates deficits in balance, activity tolerance, strength, and will benefit from skilled OT in the acute setting as well as afterwards at the [facility] level to maximize safety and independence in ADL and functional transfers.     If plan is discharge home, recommend the following:   A little help with walking and/or transfers;A little help with bathing/dressing/bathroom;Assistance with cooking/housework;Assist for transportation;Help with stairs or ramp for entrance     Functional Status Assessment   Patient has had a recent decline in their functional status and demonstrates the ability to make significant improvements in function in a reasonable and predictable amount of time.     Equipment Recommendations   BSC/3in1     Recommendations for Other Services   PT consult     Precautions/Restrictions   Precautions Precautions: Fall Restrictions Weight Bearing Restrictions Per Provider Order: No     Mobility Bed Mobility Overal bed mobility: Needs Assistance Bed Mobility: Supine to Sit, Sit to Supine     Supine to sit: Supervision, HOB elevated Sit to supine: Supervision   General bed mobility comments: extra time, assist for line management     Transfers Overall transfer level: Needs assistance Equipment used: Rollator (4 wheels) Transfers: Sit to/from Stand Sit to Stand: Min assist           General transfer comment: Assist to power up.      Balance Overall balance assessment: Needs assistance Sitting-balance support: No upper extremity supported, Feet supported Sitting balance-Leahy Scale: Good     Standing balance support: Bilateral upper extremity supported Standing balance-Leahy Scale: Poor Standing balance comment: UE support and CGA for static standing                           ADL either performed or assessed with clinical judgement   ADL Overall ADL's : Needs assistance/impaired Eating/Feeding: Modified independent;Bed level   Grooming: Wash/dry hands;Minimal assistance;Standing Grooming Details (indicate cue type and reason): sink level Upper Body Bathing: Minimal assistance;Sitting   Lower Body Bathing: Moderate assistance;Sitting/lateral leans   Upper Body Dressing : Contact guard assist;Sitting   Lower Body Dressing: Moderate assistance;Sitting/lateral leans Lower Body Dressing Details (indicate cue type and reason): able to slide on shoes Toilet Transfer: Minimal assistance;Ambulation;Rollator (4 wheels);Grab bars Toilet Transfer Details (indicate cue type and reason): good safety awareness and brake management with Rollator Toileting- Clothing Manipulation and Hygiene: Minimal assistance;Sit to/from stand       Functional mobility during ADLs: Minimal assistance;Rollator (4 wheels) General ADL Comments: assist with boost for transfers, Pt reports difficulty with IADL (cooking/cleaning etc)     Vision Patient Visual Report: No change from baseline       Perception         Praxis  Pertinent Vitals/Pain Pain Assessment Pain Assessment: Faces Faces Pain Scale: Hurts little more Pain Location: lt flank Pain Descriptors / Indicators: Grimacing, Guarding Pain  Intervention(s): Monitored during session, Repositioned, Limited activity within patient's tolerance     Extremity/Trunk Assessment Upper Extremity Assessment Upper Extremity Assessment: Generalized weakness   Lower Extremity Assessment Lower Extremity Assessment: Defer to PT evaluation   Cervical / Trunk Assessment Cervical / Trunk Assessment: Kyphotic   Communication Communication Communication: No apparent difficulties   Cognition Arousal: Alert Behavior During Therapy: WFL for tasks assessed/performed Cognition: No family/caregiver present to determine baseline                               Following commands: Intact       Cueing  General Comments   Cueing Techniques: Verbal cues  VSS on RA. Spo2 90% or greater on RA   Exercises     Shoulder Instructions      Home Living Family/patient expects to be discharged to:: Private residence Living Arrangements: Other relatives Available Help at Discharge: Family;Available PRN/intermittently Type of Home: Apartment Home Access: Level entry     Home Layout: One level     Bathroom Shower/Tub: Tub/shower unit (takes sponge bath)   Bathroom Toilet: Standard     Home Equipment: Agricultural consultant (2 wheels);Cane - single point;Rollator (4 wheels)          Prior Functioning/Environment Prior Level of Function : Independent/Modified Independent;Needs assist             Mobility Comments: Modified independent with rollator ADLs Comments: Modified independent. Fatigue with cooking. Family assist with shopping    OT Problem List: Decreased activity tolerance;Impaired balance (sitting and/or standing);Decreased knowledge of use of DME or AE;Decreased knowledge of precautions;Pain   OT Treatment/Interventions: Self-care/ADL training;Energy conservation;DME and/or AE instruction;Therapeutic activities;Patient/family education;Balance training      OT Goals(Current goals can be found in the care plan  section)   Acute Rehab OT Goals Patient Stated Goal: be able to cook again OT Goal Formulation: With patient Time For Goal Achievement: 03/14/24 Potential to Achieve Goals: Good ADL Goals Pt Will Perform Grooming: with modified independence;standing Pt Will Perform Upper Body Dressing: with modified independence;sitting Pt Will Perform Lower Body Dressing: with set-up;sit to/from stand Pt Will Transfer to Toilet: with supervision;ambulating Pt Will Perform Toileting - Clothing Manipulation and hygiene: with modified independence;sitting/lateral leans   OT Frequency:  Min 2X/week    Co-evaluation              AM-PAC OT "6 Clicks" Daily Activity     Outcome Measure Help from another person eating meals?: None Help from another person taking care of personal grooming?: A Little Help from another person toileting, which includes using toliet, bedpan, or urinal?: A Little Help from another person bathing (including washing, rinsing, drying)?: A Little Help from another person to put on and taking off regular upper body clothing?: A Little Help from another person to put on and taking off regular lower body clothing?: A Lot 6 Click Score: 18   End of Session Equipment Utilized During Treatment: Gait belt;Rollator (4 wheels) Nurse Communication: Mobility status;Precautions  Activity Tolerance: Patient tolerated treatment well Patient left: with call bell/phone within reach;Other (comment) (ED stretcher)  OT Visit Diagnosis: Unsteadiness on feet (R26.81);Other abnormalities of gait and mobility (R26.89);Muscle weakness (generalized) (M62.81);Pain Pain - Right/Left: Left Pain - part of body: Hip (flank)  Time: 1030-1049 OT Time Calculation (min): 19 min Charges:  OT General Charges $OT Visit: 1 Visit OT Evaluation $OT Eval Moderate Complexity: 1 Mod [place] OTR/L Acute Rehabilitation Services Office: [phone]   Quincy Buba 02/29/2024, 1:58 PM

## 2024-02-29 NOTE — ED Notes (Signed)
 Pt spo2 on room air is 86-89, placed on 0.5L at this time

## 2024-02-29 NOTE — ED Notes (Signed)
 Called CCMD.

## 2024-03-01 DIAGNOSIS — J9601 Acute respiratory failure with hypoxia: Secondary | ICD-10-CM | POA: Diagnosis not present

## 2024-03-01 DIAGNOSIS — I5023 Acute on chronic systolic (congestive) heart failure: Secondary | ICD-10-CM | POA: Diagnosis not present

## 2024-03-01 LAB — BASIC METABOLIC PANEL WITH GFR
Anion gap: 11 (ref 5–15)
BUN: 38 mg/dL — ABNORMAL HIGH (ref 8–23)
CO2: 28 mmol/L (ref 22–32)
Calcium: 8.9 mg/dL (ref 8.9–10.3)
Chloride: 97 mmol/L — ABNORMAL LOW (ref 98–111)
Creatinine, Ser: 7.56 mg/dL — ABNORMAL HIGH (ref 0.44–1.00)
GFR, Estimated: 5 mL/min — ABNORMAL LOW (ref >60)
Glucose, Bld: 88 mg/dL (ref 70–99)
Potassium: 3.7 mmol/L (ref 3.5–5.1)
Sodium: 136 mmol/L (ref 135–145)

## 2024-03-01 LAB — CBC
HCT: 34.1 % — ABNORMAL LOW (ref 36.0–46.0)
Hemoglobin: 10.8 g/dL — ABNORMAL LOW (ref 12.0–15.0)
MCH: 26.3 pg (ref 26.0–34.0)
MCHC: 31.7 g/dL (ref 30.0–36.0)
MCV: 83 fL (ref 80.0–100.0)
Platelets: 146 10*3/uL — ABNORMAL LOW (ref 150–400)
RBC: 4.11 MIL/uL (ref 3.87–5.11)
RDW: 18.7 % — ABNORMAL HIGH (ref 11.5–15.5)
WBC: 3.6 10*3/uL — ABNORMAL LOW (ref 4.0–10.5)
nRBC: 0 % (ref 0.0–0.2)

## 2024-03-01 LAB — HEPATITIS B SURFACE ANTIBODY, QUANTITATIVE: Hep B S AB Quant (Post): 162 m[IU]/mL

## 2024-03-01 LAB — MAGNESIUM: Magnesium: 2.3 mg/dL (ref 1.7–2.4)

## 2024-03-01 MED ORDER — DOXERCALCIFEROL 4 MCG/2ML IV SOLN
4.0000 ug | INTRAVENOUS | Status: DC
  Administered 2024-03-03: 4 ug via INTRAVENOUS
  Filled 2024-03-01: qty 2

## 2024-03-01 MED ORDER — HYDROMORPHONE HCL 2 MG PO TABS
ORAL_TABLET | ORAL | Status: AC
  Filled 2024-03-01: qty 1

## 2024-03-01 MED ORDER — CARMEX CLASSIC LIP BALM EX OINT
TOPICAL_OINTMENT | CUTANEOUS | Status: DC | PRN
  Filled 2024-03-01: qty 10

## 2024-03-01 MED ORDER — HYDROMORPHONE HCL 2 MG PO TABS
2.0000 mg | ORAL_TABLET | Freq: Once | ORAL | Status: AC
  Administered 2024-03-01: 2 mg via ORAL
  Filled 2024-03-01: qty 1

## 2024-03-01 MED ORDER — METHOCARBAMOL 500 MG PO TABS
750.0000 mg | ORAL_TABLET | Freq: Four times a day (QID) | ORAL | Status: DC | PRN
  Administered 2024-03-01 – 2024-03-02 (×2): 750 mg via ORAL
  Filled 2024-03-01 (×2): qty 2

## 2024-03-01 MED ORDER — FUROSEMIDE 40 MG PO TABS
80.0000 mg | ORAL_TABLET | Freq: Every day | ORAL | Status: DC
  Filled 2024-03-01: qty 2

## 2024-03-01 MED ORDER — ACETAMINOPHEN 500 MG PO TABS
1000.0000 mg | ORAL_TABLET | Freq: Four times a day (QID) | ORAL | Status: DC | PRN
  Administered 2024-03-02 – 2024-03-04 (×4): 1000 mg via ORAL
  Filled 2024-03-01 (×4): qty 2

## 2024-03-01 NOTE — Progress Notes (Signed)
 PT Cancellation Note  Patient Details Name: Audrey Hill MRN: [id number] DOB: [birth year]   Cancelled Treatment:    Reason Eval/Treat Not Completed: (P) Fatigue/lethargy limiting ability to participate, pt declining mobility stating fatigue post HD and "feeling sick all day". Will check back as schedule allows to continue with PT POC.  Beverly Buckler. PTA Acute Rehabilitation Services Office: [phone]    Agapito Horseman 03/01/2024, 3:08 PM

## 2024-03-01 NOTE — Plan of Care (Signed)
  Problem: Clinical Measurements: Goal: Respiratory complications will improve Outcome: Progressing   Problem: Activity: Goal: Risk for activity intolerance will decrease 03/01/2024 0749 by Edna Gouty, RN Outcome: Progressing 02/29/2024 1758 by Edna Gouty, RN Outcome: Progressing   Problem: Nutrition: Goal: Adequate nutrition will be maintained Outcome: Progressing   Problem: Pain Managment: Goal: General experience of comfort will improve and/or be controlled 03/01/2024 0749 by Edna Gouty, RN Outcome: Progressing 02/29/2024 1758 by Edna Gouty, RN Outcome: Progressing   Problem: Safety: Goal: Ability to remain free from injury will improve 03/01/2024 0749 by Edna Gouty, RN Outcome: Progressing 02/29/2024 1758 by Edna Gouty, RN Outcome: Progressing   Problem: Skin Integrity: Goal: Risk for impaired skin integrity will decrease 03/01/2024 0749 by Edna Gouty, RN Outcome: Progressing 02/29/2024 1758 by Edna Gouty, RN Outcome: Progressing

## 2024-03-01 NOTE — Progress Notes (Signed)
 Pt receives out-pt HD at [facility] [place] on TTS 11:20 am chair time. Will assist as needed.   Lauraine Polite Renal Navigator [phone]

## 2024-03-01 NOTE — Progress Notes (Signed)
   03/01/24 1229  Vitals  Temp 98 F (36.7 C)  Pulse Rate (!) 50  Resp (!) 26  BP (!) 164/74  SpO2 97 %  O2 Device Nasal Cannula  Weight 61.5 kg  Oxygen Therapy  O2 Flow Rate (L/min) 2 L/min  Post Treatment  Dialyzer Clearance Clear  Hemodialysis Intake (mL) 0 mL  Liters Processed 84  Fluid Removed (mL) 3500 mL  Tolerated HD Treatment Yes  AVG/AVF Arterial Site Held (minutes) 6 minutes  AVG/AVF Venous Site Held (minutes) 6 minutes   Received patient in bed to unit.  Alert and oriented.  Informed consent signed and in chart.   TX duration: Three hours and thirty minutes  Patient tolerated well.  Transported back to the room  Alert, without acute distress.  Hand-off given to patient's nurse.   Access used: Left upper arm fistula Access issues: None  Medication(s) given: dilaudid  2mg  PO

## 2024-03-01 NOTE — Progress Notes (Signed)
 [facility] Progress Note   Subjective:   Seen on HD, sleepy. Reports left sided rib pain with deep breaths. Denies SOB.   Objective Vitals:   03/01/24 1200 03/01/24 1223 03/01/24 1229 03/01/24 1331  BP: (!) 167/71 (!) 162/64 (!) 164/74 (!) 184/75  Pulse: 61 62 (!) 50   Resp: (!) 25 (!) 24 (!) 26 (!) 21  Temp:  98 F (36.7 C) 98 F (36.7 C) 97.9 F (36.6 C)  TempSrc:    Oral  SpO2: 100% 99% 97% 100%  Weight:   61.5 kg   Height:       Physical Exam General: alert, tired appearing female .NAD Heart: RRR, no murmurs, rubs or gallops Lungs: CTA bilaterally, respirations unlabored  Abdomen: Soft, non-distended, +BS Extremities: No edema b/l lower extremities Dialysis Access:  LUE AVF + t/b  Additional Objective Labs: Basic Metabolic Panel: Recent Labs  Lab 02/29/24 0100 02/29/24 0654 03/01/24 0424  NA 136 138 136  K 3.1* 3.6 3.7  CL 96* 97* 97*  CO2 28 28 28   GLUCOSE 73 58* 88  BUN 18 20 38*  CREATININE 5.46* 5.74* 7.56*  CALCIUM  8.8* 8.9 8.9  PHOS  --  5.4*  --    Liver Function Tests: Recent Labs  Lab 02/28/24 1723 02/29/24 0100 02/29/24 0654  AST 41 37  --   ALT 51* 45*  --   ALKPHOS 168* 141*  --   BILITOT 1.0 0.9  --   PROT 7.5 6.9  --   ALBUMIN 3.6 3.1* 3.1*   Recent Labs  Lab 02/28/24 1723  LIPASE 43   CBC: Recent Labs  Lab 02/28/24 1723 02/29/24 0100 03/01/24 0424  WBC 4.2 3.7* 3.6*  HGB 12.1 10.9* 10.8*  HCT 37.8 34.3* 34.1*  MCV 82.9 83.3 83.0  PLT 130* 141* 146*   Blood Culture    Component Value Date/Time   SDES BLOOD RIGHT FOREARM 12/02/2022 1710   SPECREQUEST  12/02/2022 1710    BOTTLES DRAWN AEROBIC AND ANAEROBIC Blood Culture results may not be optimal due to an inadequate volume of blood received in culture bottles   CULT  12/02/2022 1710    NO GROWTH 5 DAYS Performed at [facility], [address], [place], [place] [address]    REPTSTATUS 12/07/2022 FINAL 12/02/2022 1710    Cardiac  Enzymes: No results for input(s): "CKTOTAL", "CKMB", "CKMBINDEX", "TROPONINI" in the last 168 hours. CBG: Recent Labs  Lab 02/29/24 0803  GLUCAP 84   Iron  Studies: No results for input(s): "IRON ", "TIBC", "TRANSFERRIN", "FERRITIN" in the last 72 hours. @lablastinr3 @ Studies/Results: DG Chest Portable 1 View Result Date: 02/28/2024 CLINICAL DATA:  Chest pain. EXAM: PORTABLE CHEST 1 VIEW COMPARISON:  Radiograph and chest CT 02/02/2024, lung bases from abdominal CT earlier today FINDINGS: Cardiomegaly is unchanged from prior exam. Stable mediastinal contours. Vascular congestion is again seen, with improvement in interstitial prominence. No pleural effusion. No pneumothorax. IMPRESSION: Cardiomegaly with vascular congestion. Interstitial prominence has improved from prior exam. Electronically Signed   By: Chadwick Colonel M.D.   On: 02/28/2024 22:20   CT ABDOMEN PELVIS WO CONTRAST Result Date: 02/28/2024 CLINICAL DATA:  Abdominal pain. Nausea, vomiting, diarrhea, headache, left flank pain. EXAM: CT ABDOMEN AND PELVIS WITHOUT CONTRAST TECHNIQUE: Multidetector CT imaging of the abdomen and pelvis was performed following the standard protocol without IV contrast. RADIATION DOSE REDUCTION: This exam was performed according to the departmental dose-optimization program which includes automated exposure control, adjustment of the mA and/or  kV according to patient size and/or use of iterative reconstruction technique. COMPARISON:  10/06/2023 FINDINGS: Lower chest: Continued marked cardiomegaly. Hepatobiliary: Faint dependent density in the gallbladder could be from sludge or tiny gallstones. Pancreas: Unremarkable Spleen: Stable nonspecific hypodense splenic lesions, the larger measuring 2.5 cm in long axis on image 15 series 3. Adrenals/Urinary Tract: Fullness of both adrenal glands without mass. Atrophic kidneys. Stomach/Bowel: Nonspecific wall thickening in the stomach although at least some of this may be  from nondistention. Scattered nonspecific air-fluid levels in nondilated small bowel. Vascular/Lymphatic: Atherosclerosis is present, including aortoiliac atherosclerotic disease. 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 Reproductive: Uterus absent.  Adnexa unremarkable. Other: Small amount of perihepatic and perisplenic ascites. Mesenteric edema. Small amount of pelvic ascites. Subcutaneous edema diffusely. Musculoskeletal: Low position of the anorectal junction compatible with pelvic floor laxity. Lower lumbar spondylosis and degenerative disc disease with bilateral foraminal impingement at L5-S1 and mild right foraminal impingement at L4-5. Avascular necrosis of both femoral heads,, with worsening resorption and collapse of the involved portion of the left femoral head and stable flattening and collapse of the involved portion of the right femoral head. Severe secondary osteoarthritis of both hips. IMPRESSION: 1. Marked cardiomegaly. 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 2. Small amount of ascites. Mesenteric edema. Subcutaneous edema diffusely. Appearance favors third spacing of fluids. 3. Nonspecific wall thickening in the stomach although at least some of this may be from nondistention. Scattered nonspecific air-fluid levels in nondilated small bowel. 4. Avascular necrosis of both femoral heads, with worsening resorption and collapse of the involved portion of the left femoral head and stable flattening and collapse of the involved portion of the right femoral head. Severe secondary osteoarthritis of both hips. 5. Lower lumbar spondylosis and degenerative disc disease with bilateral foraminal impingement at L5-S1 and mild right foraminal impingement at L4-5. 6. Faint dependent density in the gallbladder could be from sludge or tiny gallstones. 7. Stable nonspecific hypodense splenic lesions, the larger measuring 2.5 cm in long axis. 8.  Aortic Atherosclerosis (ICD10-I70.0).  Electronically Signed   By: Freida Jes M.D.   On: 02/28/2024 19:54   Medications:   amiodarone   200 mg Oral Daily   atorvastatin   40 mg Oral Daily   Chlorhexidine  Gluconate Cloth  6 each Topical Q0600   diclofenac  Sodium  4 g Topical QID   [START ON 03/03/2024] doxercalciferol   4 mcg Intravenous Q T,Th,Sa-HD   [START ON 03/02/2024] furosemide   80 mg Oral Daily   guaiFENesin   600 mg Oral BID   heparin   5,000 Units Subcutaneous Q8H   hydrALAZINE   25 mg Oral Q8H   HYDROmorphone   2 mg Oral BID   isosorbide  dinitrate  20 mg Oral TID   losartan   50 mg Oral Daily   multivitamin  1 tablet Oral QHS   pantoprazole   40 mg Oral Daily   sertraline   25 mg Oral Daily   traZODone   50 mg Oral QHS    Dialysis Orders: TTS SW 4h  B400   65kg   AVF  Heparin  none Last OP HD 5/27, post wt 66.8kg Hectorol  4 mcg IV three times per week   CXR 5/27: IMPRESSION: Cardiomegaly with vascular congestion. Interstitial prominence has improved from prior exam. K+ 3.6, creat 5.74, Hb 10.9      Assessment/Plan: Cough/ DOE: possible vol related. Plan max UF w/ HD tomorrow. Well under EDW, will need to be adjusted.  ESRD: on HD TTS. HD today on schedule HTN: BP's a  bit high here, cont home meds. Lower vol w/  HD.  Volume: as above, no vol excess on exam. CXR+vasc congestion. Lower vol w/ HD today as tolerated.  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  Secondary hyperparathyroidism: CCa and phos in range. Cont binders ac and IV vdra three times per week.  Chronic pain syndrome: per pmd    Ramona Burner, PA-C 03/01/2024, 2:08 PM   [facility] Pager: [phone]

## 2024-03-01 NOTE — Progress Notes (Signed)
 Overnight events: Received an additional 2 mg dilaudid   Subjective: She was evaluated in hemodialysis.  Says she is having severe pain over her left flank/rib region, worse when she takes a deep breath and with palpation over the region.  Feels poorly overall.  No other new symptoms.  Objective:  Vital signs in last 24 hours: Vitals:   03/01/24 1200 03/01/24 1223 03/01/24 1229 03/01/24 1331  BP: (!) 167/71 (!) 162/64 (!) 164/74 (!) 184/75  Pulse: 61 62 (!) 50   Resp: (!) 25 (!) 24 (!) 26 (!) 21  Temp:  98 F (36.7 C) 98 F (36.7 C) 97.9 F (36.6 C)  TempSrc:    Oral  SpO2: 100% 99% 97% 100%  Weight:   61.5 kg   Height:        Physical Exam: Constitutional: Chronically ill appearing older female, in dialysis, in no distress Cardio: Bradycardia, no murmurs; overall euvolemic appearing Pulm: Normal respiratory rate and effort Abdomen: Soft, nontender, nondistended MSK: tenderness to palpation over the left upper flank/rib wall region Skin: No lesions or skin changes appreciated Neuro: Alert and oriented x 3, no focal neurological deficits  Labs: Glucose 84 Cr 7.56, BUN 38 Mag 2.3 Leukopenia 3.6 Hgb 10.8 Plt 146  Assessment/Plan: Principal Problem:   Acute on chronic heart failure (HCC) Active Problems:   ESRD on dialysis (HCC)   Acute on chronic HFrEF (heart failure with reduced ejection fraction) (HCC)   Acute respiratory failure with hypoxia (HCC)  Patient Summary: Audrey Hill is a 68 year old woman with a complex past medical history including ESRD on HD TThSat, GAVE, HFrEF, and polypharmacy who presented to the ED with a productive cough, dyspnea and n/v and is admitted for acute HFrEF exacerbation on hospital day 2.   HFrEF (EF 30-35%) Dyspnea Cough We rechecked a standing weight yesterday and it showed that she is at 143 pounds, near her dry weight of 145 pounds.  Only 200 cc of output following Lasix  yesterday, suspect she does not make much urine at  all at baseline.  She appears dry on exam today after dialysis, so we will hold off on further diuresis.  - SLP eval - Strict I's & O's; may restart home lasix  tomorrow depending on volume status - Daily weights - Cardiac telemetry - Will need HH PT/OT at discharge  Dysphagia She does note that she has started coughing when she eats or drinks in the past few weeks, which is new, so we will have SLP come evaluate her.   Left flank pain Epigastric pain Unclear etiology, but seems most likely musculoskeletal in nature given tenderness to palpation and her reported history. Suspect she may also have a gastritis given epigastric pain and thickened stomach wall on imaging.  No skin lesions or rashes present over tender areas.  Chest x-ray on presentation did not show any abnormalities in left lung, but obstructed by cardiomegaly. It is possible that she could have a viral pneumonia or gastroenteritis, which should self-resolve. She has some chronic pain and follows with a pain management specialist, though this pain seems to normally be in her neck, back and hips rather than flank. - Robaxin  750 mg QID prn - Lidocaine  patch, voltaren  - Tylenol  1000 mg q6h prn - Continue chronic pain medications  ESRD on HD, TThSat Had hemodialysis today on her regular dialysis schedule.  No acute concerns. - Nephro consulted, appreciate it - HD per normal schedule - Continue rena-vit, renvela  - Renal diet  Diarrhea History of GAVE  External hemorrhoids States she had 4 loose bowel movements today. No new acute concerns. Hemoglobin remains stable.  - daily CBC - monitor for stool output, quality, bleeding  Chronic conditions: Afib, not on AC: Hemodynamically stable; holding amiodarone  200 mg daily Chronic pain syndrome/greater trochanteric bursitis: Follows with a pain management specialist. Continued tylenol , home dilaudid  2 mg BID. Added lidocaine  patches, voltaren .  HTN: BP remains elevated. Continue  home hydralazine  25 mg q8H, isosorbide  dinitrate 20 mg TID, and losartan  50 mg daily. HLD: Continue lipitor 40 mg daily GERD: Continue home protonix  40 mg daily Depression: Continue Sertraline  25 mg daily Insomnia: Continue trazodone  50 mg daily at bedtime  Diet: Renal IVF: None VTE: Heparin  Code: Full   Dispo: Anticipated discharge to  Home vs SNF  pending medical stability and safe dispo plan.   Dorthy Gavia, MD 03/01/2024, 2:37 PM After 5pm on weekdays and 1pm on weekends: On Call pager [phone]

## 2024-03-02 ENCOUNTER — Other Ambulatory Visit: Payer: Self-pay | Admitting: Student

## 2024-03-02 ENCOUNTER — Inpatient Hospital Stay (HOSPITAL_COMMUNITY)

## 2024-03-02 DIAGNOSIS — Z79899 Other long term (current) drug therapy: Secondary | ICD-10-CM

## 2024-03-02 DIAGNOSIS — I5023 Acute on chronic systolic (congestive) heart failure: Secondary | ICD-10-CM | POA: Diagnosis not present

## 2024-03-02 LAB — BASIC METABOLIC PANEL WITH GFR
Anion gap: 10 (ref 5–15)
BUN: 32 mg/dL — ABNORMAL HIGH (ref 8–23)
CO2: 29 mmol/L (ref 22–32)
Calcium: 9.2 mg/dL (ref 8.9–10.3)
Chloride: 96 mmol/L — ABNORMAL LOW (ref 98–111)
Creatinine, Ser: 6.28 mg/dL — ABNORMAL HIGH (ref 0.44–1.00)
GFR, Estimated: 7 mL/min — ABNORMAL LOW (ref >60)
Glucose, Bld: 101 mg/dL — ABNORMAL HIGH (ref 70–99)
Potassium: 3.7 mmol/L (ref 3.5–5.1)
Sodium: 135 mmol/L (ref 135–145)

## 2024-03-02 LAB — CBC
HCT: 34.5 % — ABNORMAL LOW (ref 36.0–46.0)
Hemoglobin: 10.8 g/dL — ABNORMAL LOW (ref 12.0–15.0)
MCH: 26.4 pg (ref 26.0–34.0)
MCHC: 31.3 g/dL (ref 30.0–36.0)
MCV: 84.4 fL (ref 80.0–100.0)
Platelets: 141 10*3/uL — ABNORMAL LOW (ref 150–400)
RBC: 4.09 MIL/uL (ref 3.87–5.11)
RDW: 18.6 % — ABNORMAL HIGH (ref 11.5–15.5)
WBC: 4.4 10*3/uL (ref 4.0–10.5)
nRBC: 0 % (ref 0.0–0.2)

## 2024-03-02 MED ORDER — FUROSEMIDE 40 MG PO TABS
80.0000 mg | ORAL_TABLET | Freq: Every day | ORAL | Status: DC
  Administered 2024-03-03 – 2024-03-04 (×2): 80 mg via ORAL
  Filled 2024-03-02 (×2): qty 2

## 2024-03-02 MED ORDER — MUSCLE RUB 10-15 % EX CREA
TOPICAL_CREAM | CUTANEOUS | Status: DC | PRN
  Filled 2024-03-02: qty 85

## 2024-03-02 MED ORDER — FAMOTIDINE 20 MG PO TABS
20.0000 mg | ORAL_TABLET | Freq: Every day | ORAL | Status: DC
  Administered 2024-03-02 – 2024-03-04 (×3): 20 mg via ORAL
  Filled 2024-03-02 (×3): qty 1

## 2024-03-02 MED ORDER — METHOCARBAMOL 500 MG PO TABS
750.0000 mg | ORAL_TABLET | Freq: Three times a day (TID) | ORAL | Status: DC
  Administered 2024-03-02 – 2024-03-04 (×5): 750 mg via ORAL
  Filled 2024-03-02 (×5): qty 2

## 2024-03-02 NOTE — Progress Notes (Signed)
 Occupational Therapy Treatment Patient Details Name: Audrey Hill MRN: [id number] DOB: November 04, 1955 Today's Date: 03/02/2024   History of present illness Pt is 68 year old presented to [facility] on  02/28/24 for fluid overload due to chf exacerbation. PMH - CVA, chf, esrd on hd, depression atrial fibrillation, chronic pain and malnutrition, seizures, GERD.   OT comments  Pt. Seen for skilled OT treatment session.  Pt. Able to complete UB/LB B/D with set up to MIN A.  Good safety tech. With use of rollator, no cues required for use of brakes.  Making good gains with acute OT goals.  Cont. With POC.        If plan is discharge home, recommend the following:  A little help with walking and/or transfers;A little help with bathing/dressing/bathroom;Assistance with cooking/housework;Assist for transportation;Help with stairs or ramp for entrance   Equipment Recommendations  BSC/3in1    Recommendations for Other Services      Precautions / Restrictions Precautions Precautions: Fall       Mobility Bed Mobility Overal bed mobility: Needs Assistance Bed Mobility: Sit to Supine       Sit to supine: Contact guard assist   General bed mobility comments: light assist for BLEs into bed    Transfers Overall transfer level: Needs assistance Equipment used: Rollator (4 wheels) Transfers: Sit to/from Stand, Bed to chair/wheelchair/BSC Sit to Stand: Contact guard assist     Step pivot transfers: Contact guard assist           Balance                                           ADL either performed or assessed with clinical judgement   ADL Overall ADL's : Needs assistance/impaired     Grooming: Wash/dry hands;Wash/dry face;Sitting   Upper Body Bathing: Minimal assistance;Sitting   Lower Body Bathing: Set up;Sitting/lateral leans;Sit to/from stand;Contact guard assist Lower Body Bathing Details (indicate cue type and reason): able to wash BLEs including  feet/toes seated, and stood for all peri areas Upper Body Dressing : Contact guard assist;Sitting   Lower Body Dressing: Set up;Sitting/lateral leans Lower Body Dressing Details (indicate cue type and reason): doffed/donned socks with seat up seated Toilet Transfer: Contact guard assist;Rollator (4 wheels);Ambulation Toilet Transfer Details (indicate cue type and reason): good safety awareness and brake management with Rollator-recliner and pivotal steps to eob                Extremity/Trunk Assessment              Vision       Perception     Praxis     Communication Communication Communication: No apparent difficulties   Cognition Arousal: Alert Behavior During Therapy: WFL for tasks assessed/performed Cognition: No family/caregiver present to determine baseline                               Following commands: Intact        Cueing   Cueing Techniques: Verbal cues  Exercises      Shoulder Instructions       General Comments      Pertinent Vitals/ Pain       Pain Assessment Pain Assessment: No/denies pain  Home Living  Prior Functioning/Environment              Frequency  Min 2X/week        Progress Toward Goals  OT Goals(current goals can now be found in the care plan section)  Progress towards OT goals: Progressing toward goals     Plan      Co-evaluation                 AM-PAC OT "6 Clicks" Daily Activity     Outcome Measure   Help from another person eating meals?: None Help from another person taking care of personal grooming?: A Little Help from another person toileting, which includes using toliet, bedpan, or urinal?: A Little Help from another person bathing (including washing, rinsing, drying)?: A Little Help from another person to put on and taking off regular upper body clothing?: A Little Help from another person to put on and taking off  regular lower body clothing?: A Lot 6 Click Score: 18    End of Session Equipment Utilized During Treatment: Rollator (4 wheels)  OT Visit Diagnosis: Unsteadiness on feet (R26.81);Other abnormalities of gait and mobility (R26.89);Muscle weakness (generalized) (M62.81);Pain Pain - Right/Left: Left Pain - part of body: Hip   Activity Tolerance Patient tolerated treatment well   Patient Left in bed;with call bell/phone within reach;with bed alarm set   Nurse Communication Other (comment) (rn alerts pt. made NPO during session for pending MBS test. reviewed with rn at end of session, pt. in bed call bell in reach bed alarm set)        Time: 2841-3244 OT Time Calculation (min): 16 min  Charges: OT General Charges $OT Visit: 1 Visit OT Treatments $Self Care/Home Management : 8-22 mins  Howell Macintosh, COTA/L Acute Rehabilitation [phone]   Leory Rands [place]-COTA/L  03/02/2024, 1:39 PM

## 2024-03-02 NOTE — Plan of Care (Signed)
  Problem: Activity: Goal: Risk for activity intolerance will decrease Outcome: Progressing   Problem: Activity: Goal: Risk for activity intolerance will decrease Outcome: Progressing   Problem: Nutrition: Goal: Adequate nutrition will be maintained Outcome: Progressing   Problem: Elimination: Goal: Will not experience complications related to bowel motility Outcome: Progressing

## 2024-03-02 NOTE — Progress Notes (Signed)
 [facility] Progress Note   Subjective:  Completed dialysis yesterday. Net 3.5L Seen in room. Saying her left side hurts. Breathing ok.   Objective Vitals:   03/01/24 1947 03/02/24 0015 03/02/24 0422 03/02/24 0429  BP: 139/60 137/65 (!) 145/78   Pulse:   63   Resp: 20 20    Temp: 99.2 F (37.3 C) 98.7 F (37.1 C) 98.9 F (37.2 C)   TempSrc: Oral Oral Oral   SpO2:  100% 100%   Weight:    62.1 kg  Height:       Physical Exam General: Lying in bed, nad, on 2L [place]  Heart: RRR  Lungs: Clear  Abdomen: Non-tender  Extremities: No LE edema  Dialysis Access:  LUE AVF + t/b  Additional Objective Labs: Basic Metabolic Panel: Recent Labs  Lab 02/29/24 0654 03/01/24 0424 03/02/24 0359  NA 138 136 135  K 3.6 3.7 3.7  CL 97* 97* 96*  CO2 28 28 29   GLUCOSE 58* 88 101*  BUN 20 38* 32*  CREATININE 5.74* 7.56* 6.28*  CALCIUM  8.9 8.9 9.2  PHOS 5.4*  --   --    Liver Function Tests: Recent Labs  Lab 02/28/24 1723 02/29/24 0100 02/29/24 0654  AST 41 37  --   ALT 51* 45*  --   ALKPHOS 168* 141*  --   BILITOT 1.0 0.9  --   PROT 7.5 6.9  --   ALBUMIN 3.6 3.1* 3.1*   Recent Labs  Lab 02/28/24 1723  LIPASE 43   CBC: Recent Labs  Lab 02/28/24 1723 02/29/24 0100 03/01/24 0424 03/02/24 0359  WBC 4.2 3.7* 3.6* 4.4  HGB 12.1 10.9* 10.8* 10.8*  HCT 37.8 34.3* 34.1* 34.5*  MCV 82.9 83.3 83.0 84.4  PLT 130* 141* 146* 141*   Blood Culture    Component Value Date/Time   SDES BLOOD RIGHT FOREARM 12/02/2022 1710   SPECREQUEST  12/02/2022 1710    BOTTLES DRAWN AEROBIC AND ANAEROBIC Blood Culture results may not be optimal due to an inadequate volume of blood received in culture bottles   CULT  12/02/2022 1710    NO GROWTH 5 DAYS Performed at [facility], [address], [place], [place] [address]    REPTSTATUS 12/07/2022 FINAL 12/02/2022 1710    Cardiac Enzymes: No results for input(s): "CKTOTAL", "CKMB", "CKMBINDEX", "TROPONINI" in the last  168 hours. CBG: Recent Labs  Lab 02/29/24 0803  GLUCAP 84   Iron  Studies: No results for input(s): "IRON ", "TIBC", "TRANSFERRIN", "FERRITIN" in the last 72 hours. @lablastinr3 @ Studies/Results: No results found.  Medications:   amiodarone   200 mg Oral Daily   atorvastatin   40 mg Oral Daily   Chlorhexidine  Gluconate Cloth  6 each Topical Q0600   diclofenac  Sodium  4 g Topical QID   [START ON 03/03/2024] doxercalciferol   4 mcg Intravenous Q T,Th,Sa-HD   guaiFENesin   600 mg Oral BID   heparin   5,000 Units Subcutaneous Q8H   hydrALAZINE   25 mg Oral Q8H   HYDROmorphone   2 mg Oral BID   isosorbide  dinitrate  20 mg Oral TID   losartan   50 mg Oral Daily   multivitamin  1 tablet Oral QHS   pantoprazole   40 mg Oral Daily   sertraline   25 mg Oral Daily   traZODone   50 mg Oral QHS    Dialysis Orders: TTS SW 4h  B400   65kg   AVF  Heparin  none Last OP HD 5/27, post wt 66.8kg Hectorol   4 mcg IV three times per week   Assessment/Plan: Dyspnea/Acute/chronic HF.  Improved with fluid removal with HD. Well under EDW. Will need to be lowered at discharged.  ESRD: on HD TTS. HD Sat.  HTN: BP's a bit high here, cont home meds. Lower vol w/  HD.  Volume: CXR+vasc congestion. Lower vol w/ HD today as tolerated.  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  Secondary hyperparathyroidism: CCa and phos in range. Cont binders ac and IV vdra three times per week.  HFrEF 30-35%. Volume status improved with HD.  Dysphagia/Cough - evaluated by SLP  Chronic pain syndrome: per primary     Elona Hal PA-C [facility] 03/02/2024,9:22 AM

## 2024-03-02 NOTE — Progress Notes (Signed)
 Physical Therapy Treatment Patient Details Name: Audrey Hill MRN: [id number] DOB: 1956-07-14 Today's Date: 03/02/2024   History of Present Illness Pt is 68 year old presented to [facility] on  02/28/24 for fluid overload due to chf exacerbation. PMH - CVA, chf, esrd on hd, depression atrial fibrillation, chronic pain and malnutrition, seizures, GERD.    PT Comments  Pt agreeable to mobility, but notably weak and deconditioned with need for the rollator and minimal/cga assist.  Around 200 feet, pt's legs began to buckle with need to sit in the rollator, safety fair with the Rollator.  Has family around the home for the most part.     If plan is discharge home, recommend the following: A little help with walking and/or transfers;A little help with bathing/dressing/bathroom;Assist for transportation   Can travel by private vehicle        Equipment Recommendations  None recommended by PT    Recommendations for Other Services       Precautions / Restrictions Precautions Precautions: Fall     Mobility  Bed Mobility Overal bed mobility: Needs Assistance Bed Mobility: Sit to Supine     Supine to sit: Supervision (bed flat) Sit to supine: Supervision, Contact guard assist   General bed mobility comments: pt able to boost in sitting up toward [facility], with need for "chocking" the feet to finish scooting fully to [facility]    Transfers Overall transfer level: Needs assistance   Transfers: Sit to/from Stand Sit to Stand: Min assist, Contact guard assist                Ambulation/Gait Ambulation/Gait assistance: Contact guard assist, Min assist Gait Distance (Feet): 200 Feet Assistive device: Rollator (4 wheels) Gait Pattern/deviations: Step-through pattern, Decreased stride length, Knees buckling   Gait velocity interpretation: 1.31 - 2.62 ft/sec, indicative of limited community ambulator   General Gait Details: buckled knees afterapprox 200 feet with need to sit in the  rollator for safety.  Rolled to the bathroom and then to the bed.   Stairs             Wheelchair Mobility     Tilt Bed    Modified Rankin (Stroke Patients Only)       Balance Overall balance assessment: Needs assistance Sitting-balance support: No upper extremity supported, Bilateral upper extremity supported Sitting balance-Leahy Scale: Good     Standing balance support: Bilateral upper extremity supported   Standing balance comment: UE support and CGA for static standing                            Communication Communication Communication: No apparent difficulties  Cognition Arousal: Alert Behavior During Therapy: WFL for tasks assessed/performed   PT - Cognitive impairments: No apparent impairments                                Cueing    Exercises      General Comments        Pertinent Vitals/Pain Pain Assessment Pain Assessment: Faces Faces Pain Scale: Hurts little more Pain Location: left flank/hifp Pain Descriptors / Indicators: Grimacing Pain Intervention(s): Monitored during session    Home Living                          Prior Function            PT  Goals (current goals can now be found in the care plan section) Acute Rehab PT Goals Patient Stated Goal: go home PT Goal Formulation: With patient Time For Goal Achievement: 03/14/24 Potential to Achieve Goals: Good Progress towards PT goals: Progressing toward goals    Frequency    Min 2X/week      PT Plan      Co-evaluation              AM-PAC PT "6 Clicks" Mobility   Outcome Measure  Help needed turning from your back to your side while in a flat bed without using bedrails?: None Help needed moving from lying on your back to sitting on the side of a flat bed without using bedrails?: None Help needed moving to and from a bed to a chair (including a wheelchair)?: A Little Help needed standing up from a chair using your arms (e.g.,  wheelchair or bedside chair)?: A Little Help needed to walk in hospital room?: A Little Help needed climbing 3-5 steps with a railing? : A Little 6 Click Score: 20    End of Session       Nurse Communication: Mobility status PT Visit Diagnosis: Unsteadiness on feet (R26.81);Other abnormalities of gait and mobility (R26.89);Muscle weakness (generalized) (M62.81)     Time: 3086-5784 PT Time Calculation (min) (ACUTE ONLY): 27 min  Charges:    $Gait Training: 8-22 mins $Therapeutic Activity: 8-22 mins PT General Charges $$ ACUTE PT VISIT: 1 Visit                     03/02/2024  Nohemi Batters., PT Acute Rehabilitation Services [phone]  (office)   Durell Gilding Aerica Rincon 03/02/2024, 4:01 PM
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
## 2024-03-02 NOTE — Progress Notes (Addendum)
 Overnight events: None  Subjective: She was evaluated at bedside. Feeling a little better today. Concerned with left hip pain, consistent with chronic pain. Neck pain is better. No dyspnea or other new symptoms.   Objective:  Vital signs in last 24 hours: Vitals:   03/01/24 1947 03/02/24 0015 03/02/24 0422 03/02/24 0429  BP: 139/60 137/65 (!) 145/78   Pulse:   63   Resp: 20 20    Temp: 99.2 F (37.3 C) 98.7 F (37.1 C) 98.9 F (37.2 C)   TempSrc: Oral Oral Oral   SpO2:  100% 100%   Weight:    62.1 kg  Height:        Physical Exam: Constitutional: Chronically ill appearing older female, in no distress Cardio: Normal rate and rhythm, no murmurs, euvolemic Pulm: Normal respiratory rate and effort Abdomen: Soft, nontender, nondistended MSK: tenderness of the left hip region Skin: No lesions or skin changes appreciated Neuro: Alert and oriented x 3, no focal neurological deficits  Labs: Cr 6.28, BUN 32 Hgb stable 10.8, Plt 141  Assessment/Plan: Principal Problem:   Acute on chronic heart failure (HCC) Active Problems:   ESRD on dialysis (HCC)   Acute on chronic HFrEF (heart failure with reduced ejection fraction) (HCC)   Acute respiratory failure with hypoxia (HCC)  Patient Summary: Audrey Hill is a 68 year old woman with a complex past medical history including ESRD on HD TThSat, GAVE, HFrEF, and polypharmacy who presented to the ED with a productive cough, dyspnea and n/v and is admitted for acute HFrEF exacerbation on hospital day 3.   HFrEF (EF 30-35%) Dyspnea Euvolemic on exam today. No dyspnea or LE edema. Back at her dry weight. Touched base with HF pharmacist about scheduling a pharmacy appointment to help her with her medications.  - Strict I's & O's; holding lasix  today, will restart home lasix  tomorrow - Daily weights - D/c telemetry - Ambulatory referral placed to pharmacy for med rec appt - Will need HH PT/OT at  discharge  Dysphagia Cough Evaluated by SLP with "explosive coughing" with both solids and liquids. MBS ordered for later today. NPO for now.   Left flank pain Epigastric pain No worsening of pain this morning. Today she was most concerned about pain in her left hip, which is chronic, and for which she follows with an outpatient pain specialist. No other new symptoms reported today. - Robaxin  750 mg QID prn - Lidocaine  patch, voltaren  - Tylenol  1000 mg q6h prn - Continue chronic pain medications  ESRD on HD, TThSat We will continue with her normal dialysis schedule. No acute concerns.  - Nephro consulted, appreciate it - HD per normal schedule - Continue rena-vit, renvela  - Renal diet  Diarrhea History of GAVE External hemorrhoids No new acute concerns. Hemoglobin remains stable. Denies abdominal pain today.  - daily CBC - monitor for stool output, quality, bleeding  Chronic conditions: Afib, not on AC: Hemodynamically stable; continued amiodarone  200 mg daily Chronic pain syndrome/greater trochanteric bursitis: Follows with a pain management specialist. Continued tylenol , home dilaudid  2 mg BID. Added lidocaine  patches, voltaren .  HTN: BP remains elevated. Continued home hydralazine  25 mg q8H, isosorbide  dinitrate 20 mg TID, and losartan  50 mg daily. HLD: Continue lipitor 40 mg daily GERD: Continue home protonix  40 mg daily Depression: Continue Sertraline  25 mg daily Insomnia: Continue trazodone  50 mg daily at bedtime  Diet: NPO IVF: None VTE: Heparin  Code: Full   Dispo: Anticipated discharge to  Home pending dysphagia workup.   Audrey Gavia,  MD 03/02/2024, 1:15 PM After 5pm on weekdays and 1pm on weekends: On Call pager [phone]

## 2024-03-02 NOTE — Plan of Care (Signed)
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
## 2024-03-02 NOTE — Progress Notes (Signed)
 Modified Barium Swallow Study  Patient Details  Name: Audrey Hill MRN: [id number] Date of Birth: [birth year]  Today's Date: 03/02/2024  Modified Barium Swallow completed.  Full report located under Chart Review in the Imaging Section.  History of Present Illness Pt is 68 year old presented to [facility] on  02/28/24 for fluid overload due to chf exacerbation. PMH - CVA, chf, esrd on hd, depression atrial fibrillation, chronic pain and malnutrition, seizures, GERD. esophagram in 2022: Mild-to-moderate esophageal dysmotility.  2. Mild smooth narrowing at the esophagogastric junction. Ingested  13 mm barium tablet remained lodged in the lower thoracic esophagus.  A mild peptic stricture cannot be excluded in this location. No  discrete esophageal mass.   Clinical Impression Patient presents with a normal oropharyngeal swallow with timely oral manipulation and clearance of bolus, age appropriate swallow initiation with only intermittent flash penetration observed, and full pharyngeal clearance and airway protection. Note that she continues to present with explosive coughing post swallow across consistencies, mild coughing noted prior to initiating pos during study. Esophageal sweep did reveal stasis with retrograde flow that remained below the UES which could contribute to cough response. Known h/o esophageal dysmotility. At this time, SLP will f/u briefly to reinforce use of safe swallowing and reflux precautions. Discussed with MD. Repeat esophageal assessment may be beneficial in the future if symptoms persist although doubtful that they will be significantly different from previous. Factors that may increase risk of adverse event in presence of aspiration Roderick Civatte & Jessy Morocco 2021):    Swallow Evaluation Recommendations Recommendations: PO diet PO Diet Recommendation: Regular;Full liquid diet Liquid Administration via: Cup;Straw Medication Administration: Whole meds with liquid Supervision:  Patient able to self-feed Swallowing strategies  : Slow rate;Small bites/sips Postural changes: Position pt fully upright for meals;Stay upright 30-60 min after meals Oral care recommendations: Oral care BID (2x/day) Recommended consults: Consider esophageal assessment (if symptoms do not resolve)     Alice Vitelli MA, CCC-SLP  [place] Kadlec Meryl 03/02/2024,2:05 PM

## 2024-03-03 ENCOUNTER — Other Ambulatory Visit (HOSPITAL_COMMUNITY): Payer: Self-pay

## 2024-03-03 MED ORDER — SENNA 8.6 MG PO TABS
1.0000 | ORAL_TABLET | Freq: Every day | ORAL | Status: DC
  Administered 2024-03-03 – 2024-03-04 (×2): 8.6 mg via ORAL
  Filled 2024-03-03 (×3): qty 1

## 2024-03-03 MED ORDER — SENNA 8.6 MG PO TABS
1.0000 | ORAL_TABLET | Freq: Every day | ORAL | Status: DC | PRN
Start: 2024-03-03 — End: 2024-03-03

## 2024-03-03 MED ORDER — TRAZODONE HCL 50 MG PO TABS: 50.0000 mg | ORAL_TABLET | Freq: Every day | ORAL | Status: DC

## 2024-03-03 MED ORDER — HYDRALAZINE HCL 25 MG PO TABS: 25.0000 mg | ORAL_TABLET | Freq: Three times a day (TID) | ORAL | Status: DC

## 2024-03-03 MED ORDER — DOXERCALCIFEROL 4 MCG/2ML IV SOLN
INTRAVENOUS | Status: AC
  Filled 2024-03-03: qty 2

## 2024-03-03 MED ORDER — GUAIFENESIN ER 600 MG PO TB12
600.0000 mg | ORAL_TABLET | Freq: Two times a day (BID) | ORAL | 0 refills | Status: AC
  Filled 2024-03-03: qty 14, 7d supply, fill #0

## 2024-03-03 MED ORDER — LIDOCAINE 5 % EX PTCH
1.0000 | MEDICATED_PATCH | Freq: Every day | CUTANEOUS | 0 refills | Status: DC | PRN
  Filled 2024-03-03: qty 30, 30d supply, fill #0

## 2024-03-03 MED ORDER — POLYETHYLENE GLYCOL 3350 17 G PO PACK: 17.0000 g | PACK | Freq: Every day | ORAL | Status: DC | PRN

## 2024-03-03 NOTE — Progress Notes (Signed)
 Mobility Specialist Progress Note:   03/03/24 1330  Mobility  Activity Ambulated with assistance in hallway  Level of Assistance Contact guard assist, steadying assist  Assistive Device Four wheel walker  Distance Ambulated (ft) 200 ft  Activity Response Tolerated well  Mobility Referral Yes  Mobility visit 1 Mobility  Mobility Specialist Start Time (ACUTE ONLY) 1330  Mobility Specialist Stop Time (ACUTE ONLY) 1345  Mobility Specialist Time Calculation (min) (ACUTE ONLY) 15 min   Received pt in bed having no complaints and agreeable to mobility. Pt was asymptomatic throughout ambulation and returned to room w/o fault. Left in bed w/ call bell in reach and all needs met.   Audrey Hill Mobility Specialist  Please contact vis Secure Chat or  Rehab Office [phone]

## 2024-03-03 NOTE — Progress Notes (Addendum)
 Received patient in bed to unit. Bay 7 Alert and oriented. A & O x4 Informed consent signed and in chart. Yes  TX duration: 3 hours  Patient tolerated well.  Transported back to the room via bed Alert, without acute distress.  Hand-off given to patient's nurse. RN  Access used: Left Fistula Access issues: Venous pressure ran high causing BFR  rate to be decreased  Total UF removed: 2000 Medication(s) given: Hectoral Post HD weight: 64.2 kg Post HD VS: 172/72, 65 P, 24 R, 98.5 Temp   Walden Statz S Arvle Grabe RN, DNP [facility]

## 2024-03-03 NOTE — Discharge Summary (Addendum)
 Name: Audrey Hill MRN: [id number] DOB: 1956/06/27 68 y.o. PCP: Jonelle Neri, DO  Date of Admission: 02/28/2024  5:12 PM Date of Discharge: 03/03/24 Attending Physician: Dr. Broadus Canes  Discharge Diagnosis: Principal Problem:   Acute on chronic heart failure [facility]) Active Problems:   ESRD on dialysis (HCC)   Acute on chronic HFrEF (heart failure with reduced ejection fraction) (HCC)   Acute respiratory failure with hypoxia [facility])    Discharge Medications: Allergies as of 03/03/2024       Reactions   Diphenhydramine  Hcl    Other Reaction(s): Other (See Comments) "mini stroke"    Other reaction(s): Other (See Comments) "mini stroke" (Benadryl )   Lisinopril Rash, Swelling   Metoclopramide     Other reaction(s): Other Patient has dystonia with administration of drug.  Other reaction(s): Other (See Comments) Patient has dystonia with administration of drug.  Patient has dystonia with administration of drug.    Nortriptyline    Other reaction(s): Other (See Comments) unknown "Mini stroke"   Tramadol Nausea And Vomiting      Nitroglycerin  Hives, Rash   Ibuprofen Other (See Comments)   Upset gi   Norvasc  [amlodipine  Besylate] Swelling   Nsaids Other (See Comments)   GI upset        Medication List     PAUSE taking these medications    carvedilol  3.125 MG tablet Wait to take this until your doctor or other care provider tells you to start again. Commonly known as: COREG  Take 1 tablet (3.125 mg total) by mouth 2 (two) times daily with a meal.       TAKE these medications    albuterol  108 (90 Base) MCG/ACT inhaler Commonly known as: VENTOLIN  HFA Inhale 2 puffs into the lungs every 6 (six) hours as needed (Cough).   amiodarone  200 MG tablet Commonly known as: PACERONE  Take 1 tablet (200 mg total) by mouth daily.   atorvastatin  40 MG tablet Commonly known as: LIPITOR Take 1 tablet (40 mg total) by mouth daily.   Calcium  Acetate 667 MG Tabs Take 667 mg  by mouth 5 (five) times daily.   diclofenac  Sodium 1 % Gel Commonly known as: Voltaren  Apply 4 g topically 4 (four) times daily.   Ensure Take 237 mLs by mouth 3 (three) times daily between meals.   ferrous sulfate 325 (65 FE) MG tablet Take 325 mg by mouth every other day.   furosemide  80 MG tablet Commonly known as: LASIX  Take 1 tablet (80 mg total) by mouth daily.   guaiFENesin  600 MG 12 hr tablet Commonly known as: MUCINEX  Take 1 tablet (600 mg total) by mouth 2 (two) times daily for 7 days.   hydrALAZINE  25 MG tablet Commonly known as: APRESOLINE  Take 1 tablet (25 mg total) by mouth 3 (three) times daily. What changed: how much to take   HYDROmorphone  2 MG tablet Commonly known as: DILAUDID  Take 1 tablet (2 mg total) by mouth 2 (two) times daily as needed for severe pain (pain score 7-10).   isosorbide  dinitrate 20 MG tablet Commonly known as: ISORDIL  Take 1 tablet (20 mg total) by mouth 3 (three) times daily.   lidocaine  5 % Commonly known as: Lidoderm  Place 1 patch onto the skin daily as needed. Remove & Discard patch within 12 hours or as directed by MD   losartan  50 MG tablet Commonly known as: COZAAR  Take 1 tablet (50 mg total) by mouth daily.   multivitamin Tabs tablet Take 1 tablet by mouth daily.  pantoprazole  40 MG tablet Commonly known as: PROTONIX  Take 1 tablet (40 mg total) by mouth daily.   polyethylene glycol 17 g packet Commonly known as: MIRALAX  / GLYCOLAX  Take 17 g by mouth daily.   senna 8.6 MG Tabs tablet Commonly known as: SENOKOT Take 2 tablets by mouth daily as needed for mild constipation.   sertraline  25 MG tablet Commonly known as: ZOLOFT  Take 1 tablet (25 mg total) by mouth daily.   sevelamer  carbonate 800 MG tablet Commonly known as: RENVELA  Take 1 tablet (800 mg total) by mouth 3 (three) times daily with meals.   traZODone  50 MG tablet Commonly known as: DESYREL  Take 1 tablet (50 mg total) by mouth at bedtime. What  changed: how much to take   triamcinolone 0.025 % ointment Commonly known as: KENALOG Apply 1 Application topically daily as needed (shin).        Disposition and follow-up:   Audrey Hill was discharged from [facility] in Stable condition.  At the hospital follow up visit please address:  1.  Follow-up:  a. HFrEF: assess volume status, d/c weight 62.6 kg, on home lasix  80 mg daily, ensure HD sessions    b. Dysphagia: MBS without oropharyngeal dysmotility so SLP restarted normal diet, hx of esophageal dysmotility, followed by [facility]    c. Polypharmacy: needs full med rec, placed referral for [facility] regarding polypharmacy  2.  Labs / imaging needed at time of follow-up: none 3.  Pending labs/ test needing follow-up: none 4.  Medication Changes -HOLD Coreg  due to bradycardia  -Lidocaine  patches   Follow-up Appointments:  Follow-up Information     Dorthy Gavia, MD. Go on 03/09/2024.   Specialty: Internal Medicine Why: Appointment on: 03/09/2024 10:45 AM Contact information: [address] [place] [address] [phone]         Avanell Leigh, MD Follow up on 03/12/2024.   Specialties: Cardiology, Radiology Why: Appointment on: 03/12/2024 10:45 AM Contact information: [address] [place] [address] [phone]         Trudi Fus, MD. Schedule an appointment as soon as possible for a visit.   Why: [facility] Contact information: [address] [place] [address] [phone]                 Hospital Course by problem list: Patient Summary: Audrey Hill is a 68 year old woman with a complex past medical history including ESRD on HD TThSat, GAVE, HFrEF, and polypharmacy who presented to the ED with a productive cough, dyspnea and n/v and is admitted for acute HFrEF exacerbation.   HFrEF (EF 30-35%) Dyspnea She presented with some  evidence of volume overload and was diuresed with IV Lasix . BNP >4500. After 1 day of IV diuresis, her standing weight was 143 lbs, which is below her reported dry weight of 145, which may not be fully accurate, nephro may adjust dry weight.  Her symptoms resolved and exam improved. Stable on home 2L [place]. D/c weight was 62.6 kg (139 lbs). Resumed her home lasix  80 mg daily along with HD per nephro.    Dysphagia Cough She noted that she has started coughing when she eats or drinks in the past few weeks, which is new, so we had the speech-language pathologist and evaluate her. MBS notes normal oropharyngeal swallow and clearance. Has prior hx of esophageal dysmotility. Was able to tolerate renal diet (solids and liquids) without  issue prior to discharge. Did start famotidine  PRN along with her PPI. May benefit from outpatient follow up.     Left flank pain Epigastric pain Hx of AVN b/l hip Unclear etiology, but seems most likely musculoskeletal in nature given tenderness to palpation and her reported history. Suspect she may also have a gastritis given epigastric pain and thickened stomach wall on imaging.  No skin lesions or rashes present over tender areas.  Chest x-ray on presentation did not show any abnormalities in left lung, but obstructed by cardiomegaly. It is possible that she could have a viral pneumonia or gastroenteritis, which should self-resolve. She has some chronic pain and follows with a pain management specialist, though this pain seems to normally be in her neck, back and hips rather than flank. She was given Robaxin  along with Lidocaine  patch, voltaren  gel and PRN tylenol . Pain stable/not worsening and patient has f/u with her pain specialist.    ESRD on HD, TThSat Continued HD during hospital course. RFP stable. Continue home nephro medications. Last inpatient HD was 5/31.   Chronic conditions: Afib, not on AC: Hemodynamically stable; continued amiodarone  200 mg daily Chronic pain  syndrome/greater trochanteric bursitis: Follows with a pain management specialist. Continued tylenol , home dilaudid  2 mg BID. Added lidocaine  patches, voltaren .  HTN: Continued home hydralazine  25 mg TID, isosorbide  dinitrate 20 mg TID, and losartan  50 mg daily. Stopped home Coreg  due to bradycardia, can f/u outpatient for restart.  HLD: Continue home lipitor 40 mg daily GERD: Continue home protonix  40 mg daily Depression: Continue home sertraline  25 mg daily Insomnia: Continue home trazodone  50 mg daily at bedtime History of GAVE/External hemorrhoids: Had initially loose stools but gradually resolved. No worsening signs/symptoms. Hgb at baseline of 10.8.    Discharge Subjective: Patient evaluated at bedside. States no acute concerns. States feeling better compared to yesterday and improved from admission. Aware of follow up with [facility] next week. Plan for HD today before discharge.   Discharge Exam:   BP (!) 167/85   Pulse (!) 57   Temp 98.6 F (37 C) (Oral)   Resp 16   Ht 5\' 6"  (1.676 m)   Wt 62.6 kg   SpO2 96%   BMI 22.26 kg/m  Constitutional: chronically ill appearing female, in no acute distress Cardiovascular: regular rate and rhythm, no LE edema  Pulmonary/Chest: normal work of breathing on room air (at home 2L PRN) Abdominal: bowel sounds present, soft, non-tender, non-distended Neurological: awake and alert  Psych: pleasant mood   Pertinent Labs, Studies, and Procedures:     Latest Ref Rng & Units 03/02/2024    3:59 AM 03/01/2024    4:24 AM 02/29/2024    1:00 AM  CBC  WBC 4.0 - 10.5 K/uL 4.4  3.6  3.7   Hemoglobin 12.0 - 15.0 g/dL 96.0  45.4  09.8   Hematocrit 36.0 - 46.0 % 34.5  34.1  34.3   Platelets 150 - 400 K/uL 141  146  141        Latest Ref Rng & Units 03/02/2024    3:59 AM 03/01/2024    4:24 AM 02/29/2024    6:54 AM  CMP  Glucose 70 - 99 mg/dL 119  88  58   BUN 8 - 23 mg/dL 32  38  20   Creatinine 0.44 - 1.00 mg/dL 1.47  8.29  5.62   Sodium 135 - 145  mmol/L 135  136  138   Potassium 3.5 - 5.1 mmol/L 3.7  3.7  3.6   Chloride 98 - 111 mmol/L 96  97  97   CO2 22 - 32 mmol/L 29  28  28    Calcium  8.9 - 10.3 mg/dL 9.2  8.9  8.9     DG Chest Portable 1 View Result Date: 02/28/2024 CLINICAL DATA:  Chest pain. EXAM: PORTABLE CHEST 1 VIEW COMPARISON:  Radiograph and chest CT 02/02/2024, lung bases from abdominal CT earlier today FINDINGS: Cardiomegaly is unchanged from prior exam. Stable mediastinal contours. Vascular congestion is again seen, with improvement in interstitial prominence. No pleural effusion. No pneumothorax. IMPRESSION: Cardiomegaly with vascular congestion. Interstitial prominence has improved from prior exam. Electronically Signed   By: Chadwick Colonel M.D.   On: 02/28/2024 22:20   CT ABDOMEN PELVIS WO CONTRAST Result Date: 02/28/2024 CLINICAL DATA:  Abdominal pain. Nausea, vomiting, diarrhea, headache, left flank pain. EXAM: CT ABDOMEN AND PELVIS WITHOUT CONTRAST TECHNIQUE: Multidetector CT imaging of the abdomen and pelvis was performed following the standard protocol without IV contrast. RADIATION DOSE REDUCTION: This exam was performed according to the departmental dose-optimization program which includes automated exposure control, adjustment of the mA and/or kV according to patient size and/or use of iterative reconstruction technique. COMPARISON:  10/06/2023 FINDINGS: Lower chest: Continued marked cardiomegaly. Hepatobiliary: Faint dependent density in the gallbladder could be from sludge or tiny gallstones. Pancreas: Unremarkable Spleen: Stable nonspecific hypodense splenic lesions, the larger measuring 2.5 cm in long axis on image 15 series 3. Adrenals/Urinary Tract: Fullness of both adrenal glands without mass. Atrophic kidneys. Stomach/Bowel: Nonspecific wall thickening in the stomach although at least some of this may be from nondistention. Scattered nonspecific air-fluid levels in nondilated small bowel. Vascular/Lymphatic:  Atherosclerosis is present, including aortoiliac atherosclerotic disease. 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 Reproductive: Uterus absent.  Adnexa unremarkable. Other: Small amount of perihepatic and perisplenic ascites. Mesenteric edema. Small amount of pelvic ascites. Subcutaneous edema diffusely. Musculoskeletal: Low position of the anorectal junction compatible with pelvic floor laxity. Lower lumbar spondylosis and degenerative disc disease with bilateral foraminal impingement at L5-S1 and mild right foraminal impingement at L4-5. Avascular necrosis of both femoral heads,, with worsening resorption and collapse of the involved portion of the left femoral head and stable flattening and collapse of the involved portion of the right femoral head. Severe secondary osteoarthritis of both hips. IMPRESSION: 1. Marked cardiomegaly. 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 2. Small amount of ascites. Mesenteric edema. Subcutaneous edema diffusely. Appearance favors third spacing of fluids. 3. Nonspecific wall thickening in the stomach although at least some of this may be from nondistention. Scattered nonspecific air-fluid levels in nondilated small bowel. 4. Avascular necrosis of both femoral heads, with worsening resorption and collapse of the involved portion of the left femoral head and stable flattening and collapse of the involved portion of the right femoral head. Severe secondary osteoarthritis of both hips. 5. Lower lumbar spondylosis and degenerative disc disease with bilateral foraminal impingement at L5-S1 and mild right foraminal impingement at L4-5. 6. Faint dependent density in the gallbladder could be from sludge or tiny gallstones. 7. Stable nonspecific hypodense splenic lesions, the larger measuring 2.5 cm in long axis. 8.  Aortic Atherosclerosis (ICD10-I70.0). Electronically Signed   By: Freida Jes M.D.   On: 02/28/2024 19:54     Discharge Instructions: You  were hospitalized for fluid overload. We were able to remove the extra fluid with IV lasix  and your symptoms improved. Glad you are feeling better. You will need to see our clinic  along with your other specialists after leaving hospital. Thank you for allowing us  to be part of your care.   We arranged for you to follow up at:  -[facility]: 03/09/24 PLEASE MAKE SURE TO BRING ALL YOUR MEDICATIONS TO VISIT   Please note these changes made to your medications:  *Please START taking:  -Lidocaine  patches daily   *Please STOP taking:  -HOLD Carvedilol  until you see your primary care doctor (stopped due to low heart rate)   Signed: Jearldine Mina, DO 03/03/2024, 3:34 PM

## 2024-03-03 NOTE — Plan of Care (Signed)
  Problem: Safety: Goal: Ability to remain free from injury will improve Outcome: Progressing   Problem: Pain Managment: Goal: General experience of comfort will improve and/or be controlled Outcome: Progressing   Problem: Elimination: Goal: Will not experience complications related to bowel motility Outcome: Progressing   Problem: Elimination: Goal: Will not experience complications related to urinary retention Outcome: Progressing   Problem: Nutrition: Goal: Adequate nutrition will be maintained Outcome: Progressing   Problem: Activity: Goal: Risk for activity intolerance will decrease Outcome: Progressing

## 2024-03-03 NOTE — Plan of Care (Signed)

## 2024-03-03 NOTE — Discharge Instructions (Addendum)
 You were hospitalized for fluid overload. We were able to remove the extra fluid with IV lasix  and your symptoms improved. Glad you are feeling better. You will need to see our clinic along with your other specialists after leaving hospital. Thank you for allowing us  to be part of your care.   We arranged for you to follow up at:  -[facility] Center: 03/09/24 PLEASE MAKE SURE TO BRING ALL YOUR MEDICATIONS TO VISIT   Please note these changes made to your medications:  *Please START taking:  -Lidocaine  patches daily  -Senna daily for constipation   *Please STOP taking:  -HOLD Carvedilol  until you see your primary care doctor (stopped due to low heart rate)  Please call our clinic if you have any questions or concerns, we may be able to help and keep you from a long and expensive emergency room wait. Our clinic and after hours phone number is [phone], the best time to call is Monday through Friday 9 am to 4 pm but there is always someone available 24/7 if you have an emergency. If you need medication refills please notify your pharmacy one week in advance and they will send us  a request.

## 2024-03-03 NOTE — Progress Notes (Addendum)
 Met with pt this morning to discuss [facility] PT/OT. Pt plans to return home with the support of her grandson who lives with her and her daughter. Her daughter will provide transportation at time of DC. Discussed preference for a [facility] agency. She reports she doesn't have a preference. Asked pt if I could call her daughter to discuss HH. She reports I can call [facility]. Contacted [facility] for [facility] therapy. Alease Hunter accepted the referral. Pt agrees with [facility]. Contacted [facility] and left a VM regarding HH.   [facility] returned my call. She agrees to use [facility].

## 2024-03-03 NOTE — Progress Notes (Signed)
 [facility] Progress Note   Subjective:   Seen in room.  No new events overnight. On room air this am. Dialysis today   Objective Vitals:   03/02/24 2351 03/03/24 0452 03/03/24 0516 03/03/24 0802  BP: (!) 156/72 (!) 150/69  (!) 158/70  Pulse: 60 (!) 53  (!) 57  Resp: 19 16  14   Temp: 98.9 F (37.2 C) 98.7 F (37.1 C)  98.5 F (36.9 C)  TempSrc: Oral Oral  Oral  SpO2: 97% 96%  96%  Weight:   62.6 kg   Height:       Physical Exam General: Lying in bed, nad, on 2L [place]  Heart: RRR  Lungs: Clear  Abdomen: Non-tender  Extremities: No LE edema  Dialysis Access:  LUE AVF + t/b  Additional Objective Labs: Basic Metabolic Panel: Recent Labs  Lab 02/29/24 0654 03/01/24 0424 03/02/24 0359  NA 138 136 135  K 3.6 3.7 3.7  CL 97* 97* 96*  CO2 28 28 29   GLUCOSE 58* 88 101*  BUN 20 38* 32*  CREATININE 5.74* 7.56* 6.28*  CALCIUM  8.9 8.9 9.2  PHOS 5.4*  --   --    Liver Function Tests: Recent Labs  Lab 02/28/24 1723 02/29/24 0100 02/29/24 0654  AST 41 37  --   ALT 51* 45*  --   ALKPHOS 168* 141*  --   BILITOT 1.0 0.9  --   PROT 7.5 6.9  --   ALBUMIN 3.6 3.1* 3.1*   Recent Labs  Lab 02/28/24 1723  LIPASE 43   CBC: Recent Labs  Lab 02/28/24 1723 02/29/24 0100 03/01/24 0424 03/02/24 0359  WBC 4.2 3.7* 3.6* 4.4  HGB 12.1 10.9* 10.8* 10.8*  HCT 37.8 34.3* 34.1* 34.5*  MCV 82.9 83.3 83.0 84.4  PLT 130* 141* 146* 141*   Blood Culture    Component Value Date/Time   SDES BLOOD RIGHT FOREARM 12/02/2022 1710   SPECREQUEST  12/02/2022 1710    BOTTLES DRAWN AEROBIC AND ANAEROBIC Blood Culture results may not be optimal due to an inadequate volume of blood received in culture bottles   CULT  12/02/2022 1710    NO GROWTH 5 DAYS Performed at [facility], [address], [place], [place] [address]    REPTSTATUS 12/07/2022 FINAL 12/02/2022 1710    Cardiac Enzymes: No results for input(s): "CKTOTAL", "CKMB", "CKMBINDEX", "TROPONINI" in the  last 168 hours. CBG: Recent Labs  Lab 02/29/24 0803  GLUCAP 84   Iron  Studies: No results for input(s): "IRON ", "TIBC", "TRANSFERRIN", "FERRITIN" in the last 72 hours. @lablastinr3 @ Studies/Results: DG Swallowing Func-Speech Pathology Result Date: 03/02/2024 Speech Pathology: Modified Barium Swallow (Note posted by Myna Asal for [facility], who performed and documented the study) HPI/PMH: HPI: Pt is 68 year old presented to [facility] on  02/28/24 for fluid overload due to chf exacerbation. PMH - CVA, chf, esrd on hd, depression atrial fibrillation, chronic pain and malnutrition, seizures, GERD. esophagram in 2022: Mild-to-moderate esophageal dysmotility.  2. Mild smooth narrowing at the esophagogastric junction. Ingested  13 mm barium tablet remained lodged in the lower thoracic esophagus.  A mild peptic stricture cannot be excluded in this location. No  discrete esophageal mass.   Clinical Impression: Clinical Impression: Patient presents with a normal oropharyngeal swallow with timely oral manipulation and clearance of bolus, age appropriate swallow initiation with only intermittent flash penetration observed, and full pharyngeal clearance and airway protection. Note that she continues to present with explosive coughing post swallow  across consistencies, mild coughing noted prior to initiating pos during study. Esophageal sweep did reveal stasis with retrograde flow that remained below the UES which could contribute to cough response. Known h/o esophageal dysmotility. At this time, SLP will f/u briefly to reinforce use of safe swallowing and reflux precautions. Discussed with MD. Repeat esophageal assessment may be beneficial in the future if symptoms persist although doubtful that they will be significantly different from previous.  Factors that may increase risk of adverse event in presence of aspiration Roderick Civatte & Jessy Morocco 2021): No data recorded  Recommendations/Plan: Swallowing Evaluation  Recommendations Swallowing Evaluation Recommendations Recommendations: PO diet PO Diet Recommendation: Regular; Full liquid diet Liquid Administration via: Cup; Straw Medication Administration: Whole meds with liquid Supervision: Patient able to self-feed Swallowing strategies  : Slow rate; Small bites/sips Postural changes: Position pt fully upright for meals; Stay upright 30-60 min after meals Oral care recommendations: Oral care BID (2x/day) Recommended consults: Consider esophageal assessment (if symptoms do not resolve)    Treatment Plan Treatment Plan Treatment recommendations: Therapy as outlined in treatment plan below Functional status assessment: Patient has had a recent decline in their functional status and demonstrates the ability to make significant improvements in function in a reasonable and predictable amount of time. Treatment frequency: Min 1x/week Treatment duration: 1 week Interventions: Compensatory techniques; Patient/family education; Diet toleration management by SLP     Recommendations Recommendations for follow up therapy are one component of a multi-disciplinary discharge planning process, led by the attending physician.  Recommendations may be updated based on patient status, additional functional criteria and insurance authorization.  Assessment: Orofacial Exam: Orofacial Exam Oral Cavity: Oral Hygiene: WFL Oral Cavity - Dentition: Missing dentition Orofacial Anatomy: WFL (submandibular gland swelling bilaterally?) Oral Motor/Sensory Function: WFL    Anatomy: Anatomy: WFL   Boluses Administered: Boluses Administered Boluses Administered: Thin liquids (Level 0); Puree; Solid    Oral Impairment Domain: Oral Impairment Domain Lip Closure: No labial escape Tongue control during bolus hold: Cohesive bolus between tongue to palatal seal Bolus preparation/mastication: Timely and efficient chewing and mashing Bolus transport/lingual motion: Brisk tongue motion Oral residue: Complete oral  clearance Location of oral residue : N/A Initiation of pharyngeal swallow : Valleculae    Pharyngeal Impairment Domain: Pharyngeal Impairment Domain Soft palate elevation: No bolus between soft palate (SP)/pharyngeal wall (PW) Laryngeal elevation: Complete superior movement of thyroid  cartilage with complete approximation of arytenoids to epiglottic petiole Anterior hyoid excursion: Complete anterior movement Epiglottic movement: Complete inversion Laryngeal vestibule closure: Complete, no air/contrast in laryngeal vestibule Pharyngeal stripping wave : Present - complete Pharyngeal contraction (A/P view only): N/A Pharyngoesophageal segment opening: Complete distension and complete duration, no obstruction of flow Tongue base retraction: No contrast between tongue base and posterior pharyngeal wall (PPW) Pharyngeal residue: Complete pharyngeal clearance Location of pharyngeal residue: N/A    Esophageal Impairment Domain: Esophageal Impairment Domain Esophageal clearance upright position: Esophageal retention with retrograde flow below pharyngoesophageal segment (PES)    Pill: Pill Consistency administered: Thin liquids (Level 0) Thin liquids (Level 0): [facility]    Penetration/Aspiration Scale Score: Penetration/Aspiration Scale Score 1.  Material does not enter airway: Thin liquids (Level 0); Mildly thick liquids (Level 2, nectar thick); Puree; Solid 2.  Material enters airway, remains ABOVE vocal cords then ejected out: Thin liquids (Level 0)    Compensatory Strategies: No data recorded    General Information: Caregiver present: No  Diet Prior to this Study: Regular; Thin liquids (Level 0)   Temperature : Normal   Respiratory  Status: WFL   Supplemental O2: None (Room air)   History of Recent Intubation: No  Behavior/Cognition: Alert; Cooperative; Pleasant mood  Self-Feeding Abilities: Able to self-feed  Baseline vocal quality/speech: Not observed (higher pitch than in the room earlier, appearing strained at times)   Volitional Cough: Able to elicit  Volitional Swallow: Able to elicit  No data recorded  Goal Planning: No data recorded No data recorded No data recorded No data recorded Consulted and agree with results and recommendations: Patient; Nurse; Physician   Pain: Pain Assessment Pain Assessment: Faces Faces Pain Scale: 6 Pain Location: left mandible, left neck during coughing post swallow Pain Descriptors / Indicators: Grimacing Pain Intervention(s): Monitored during session    End of Session: Start Time:SLP Start Time (ACUTE ONLY): 1330  Stop Time: SLP Stop Time (ACUTE ONLY): 1348  Time Calculation:SLP Time Calculation (min) (ACUTE ONLY): 18 min  Charges: SLP Evaluations $ SLP Speech Visit: 1 Visit  SLP Evaluations $BSS Swallow: 1 Procedure $MBS Swallow: 1 Procedure      Medications:   amiodarone   200 mg Oral Daily   atorvastatin   40 mg Oral Daily   Chlorhexidine  Gluconate Cloth  6 each Topical Q0600   diclofenac  Sodium  4 g Topical QID   doxercalciferol   4 mcg Intravenous Q T,Th,Sa-HD   famotidine   20 mg Oral Daily   furosemide   80 mg Oral Daily   guaiFENesin   600 mg Oral BID   heparin   5,000 Units Subcutaneous Q8H   hydrALAZINE   25 mg Oral Q8H   HYDROmorphone   2 mg Oral BID   isosorbide  dinitrate  20 mg Oral TID   losartan   50 mg Oral Daily   methocarbamol   750 mg Oral TID   multivitamin  1 tablet Oral QHS   pantoprazole   40 mg Oral Daily   sertraline   25 mg Oral Daily   traZODone   50 mg Oral QHS    Dialysis Orders: TTS SW 4h  B400   65kg   AVF  Heparin  none Last OP HD 5/27, post wt 66.8kg Hectorol  4 mcg IV three times per week   Assessment/Plan: Dyspnea/Acute/chronic HF.  Improved with fluid removal with HD. Well under EDW. Will need to be lowered at discharged.  ESRD: on HD TTS. HD Sat.  HTN: BP's a bit high here, cont home meds. Lower vol w/  HD.  Volume: CXR+vasc congestion. Lower volume with HD as tolerated.   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   Secondary hyperparathyroidism: CCa and phos in range. Cont binders ac and IV vdra three times per week.  HFrEF 30-35%. Volume status improved with HD.  Dysphagia/Cough - evaluated by SLP  Chronic pain syndrome: per primary     Elona Hal PA-C [facility] 03/03/2024,12:19 PM

## 2024-03-04 NOTE — Discharge Planning (Signed)
 [facility] Patient Discharge Orders- [facility] CL[facility]IC: AF  Patient's name: Audrey Hill Admit/DC Dates: 02/28/2024 - 03/04/2024  Discharge Diagnoses: Dyspnea/Volume overload   Recent Labs  Lab 02/29/24 0654 03/01/24 0424 03/02/24 0359  HGB  --    < > 10.8*  K 3.6   < > 3.7  CALCIUM  8.9   < > 9.2  PHOS 5.4*  --   --   ALBUMIN 3.1*  --   --    < > = values in this interval not displayed.   Aranesp : Given: --   Date of last dose/amount: --   PRBC's Given: -- Date/# of units: -- ESA dose for discharge: Per protocol   Outpatient Dialysis Orders:  -Heparin : None  -EDW:  Lower to 62.5 kg   -[place]: No change  Access intervention/Change: --  IV Antibiotics: --   OTHER/APPTS/LABS     Completed by: Elona Hal PA-C   D/C Meds to be reconciled by nurse after every discharge.    Reviewed by: MD:______ RN_______

## 2024-03-04 NOTE — Plan of Care (Signed)

## 2024-03-04 NOTE — Progress Notes (Signed)
 Day RN and I had spoke with patient's daughter Mardy Shall) around [place] on 5/31 and daughter agreed to transport patient home after HD session that will end around [facility] on 5/31. However, night RN reached out to daughter and she refused to return to hospital to pick patient up. Attempted to reach out to grandson as well without response. Patient evaluated this morning, no acute changes or concerns per patient and nursing staff. Patient, RN and I will attempt to call patient's daughter to arrange transport for this morning.   Avilyn Virtue, DO Internal Medicine Resident PGY-2 Please contact the on-call pager after 5 pm and on weekends at [phone].

## 2024-03-05 ENCOUNTER — Telehealth: Payer: Self-pay | Admitting: Physician Assistant

## 2024-03-05 NOTE — Telephone Encounter (Signed)
 Transition of care contact from inpatient facility  Date of Discharge: 03/04/24 Date of Contact: 03/05/24 Method of contact: Phone  Attempted to contact patient to discuss transition of care from inpatient admission. Received message that call could not be processed at this time. Will continue to try to contact patient at her dialysis unit.  Ramona Burner, PA-C 03/05/2024, 12:06 PM  [facility] Pager: [phone]

## 2024-03-06 ENCOUNTER — Telehealth: Payer: Self-pay | Admitting: Pharmacist

## 2024-03-06 NOTE — Progress Notes (Signed)
   03/06/2024  Patient ID: Annamaria Barrette, female   DOB: 1956/06/04, 68 y.o.   MRN: [id number]  Called and spoke with the patient on the phone today since hospital discharge.   Has PCP follow-up scheduled for Friday.  Currently reports her body aches and she is almost out of her pain medications. No new concerns with shortness of breath or swelling. Still having a little bit of headaches (not as severe), but does not have a BP reading since returning home. Main concern is that nose has been running and unknown as to why.  Asked several times if she stopped the Carvedilol  as only medication changes and said yes. Did not know why- advised it was due to her low heart rate. Asked again and still confirmed having discontinued the medication as advised.    Delvin File, PharmD [facility]  Phone Number: [phone]

## 2024-03-09 ENCOUNTER — Encounter: Payer: Self-pay | Admitting: Student

## 2024-03-09 ENCOUNTER — Ambulatory Visit: Admitting: Student

## 2024-03-09 VITALS — BP 138/74 | HR 74 | Temp 98.6°F | Ht 66.0 in | Wt 139.6 lb

## 2024-03-09 DIAGNOSIS — G8929 Other chronic pain: Secondary | ICD-10-CM

## 2024-03-09 DIAGNOSIS — I5020 Unspecified systolic (congestive) heart failure: Secondary | ICD-10-CM | POA: Diagnosis not present

## 2024-03-09 DIAGNOSIS — N186 End stage renal disease: Secondary | ICD-10-CM

## 2024-03-09 DIAGNOSIS — M545 Low back pain, unspecified: Secondary | ICD-10-CM

## 2024-03-09 DIAGNOSIS — Z79899 Other long term (current) drug therapy: Secondary | ICD-10-CM

## 2024-03-09 DIAGNOSIS — G894 Chronic pain syndrome: Secondary | ICD-10-CM

## 2024-03-09 DIAGNOSIS — Z992 Dependence on renal dialysis: Secondary | ICD-10-CM

## 2024-03-09 MED ORDER — HYDROMORPHONE HCL 2 MG PO TABS: 2.0000 mg | ORAL_TABLET | Freq: Two times a day (BID) | ORAL | 0 refills | Status: AC | PRN

## 2024-03-09 NOTE — Assessment & Plan Note (Addendum)
 She has polypharmacy and a limited understanding of the medications she is taking.  A referral for the [facility] was placed at discharge from the hospital regarding polypharmacy for full med rec. She brought her medicines today and I have reviewed them; she is taking all of those listed on her chart.  She has a nurse who comes out and makes pillboxes for her in addition to pill packs, which she takes daily.  This will likely be something we will need to work on over time, but hopefully the [facility] visit will be helpful.

## 2024-03-09 NOTE — Assessment & Plan Note (Addendum)
 She appears euvolemic on exam.  Her current weight is 63.3 kg, from her hospital discharge weight of 62.6 kg.  She is taking 80 mg Lasix  at home.  She denies any worsening orthopnea, dyspnea with exertion.  Her carvedilol  was held in the setting of bradycardia and acute exacerbation. Her HR is in the 70s today. I will hold off on restarting this for now. She has a follow up appointment with her Cardiologist next week. We will continue Lasix  at her current dose.

## 2024-03-09 NOTE — Assessment & Plan Note (Addendum)
 She has a long history of chronic back and leg pain.  Currently takes Dilaudid  2 mg twice daily as needed and is requesting a refill.  PDMP reviewed, this medication she has been on for a while and her last prescription is completed, so I will refill it.  She has an upcoming procedure with general surgery to get injections for trochanteric bursitis.  She also has a history of avascular necrosis which contributes to her pain.  An order was placed at discharge for home health physical therapy, which she has not set up yet. I have encouraged her to do this.  She is requesting a muscle relaxer, which helped her in the past, but I would like to hold off on this for now given her polypharmacy.  Hopefully her injections and physical therapy provide her some pain relief.
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
## 2024-03-09 NOTE — Assessment & Plan Note (Signed)
 History of ESRD.  Dialysis schedule is Tuesday, Thursday, and Saturday.  She had dialysis yesterday, next session is tomorrow.  No acute concerns with dialysis, will continue as scheduled.

## 2024-03-09 NOTE — Patient Instructions (Signed)
 Thank you, Ms.Dion S Stults for allowing us  to provide your care today. Today we discussed your blood pressure, heart rate, heart failure, and pain.    I have sent in a refill for your pain medicine. As we discussed, please call the physical therapist back to schedule your therapy, as this will help with your pain.   I have reviewed the medications you brought. You can keep taking them as prescribed. Please do not take the Coreg  until you follow up with your cardiologist.   I have ordered the following medication/changed the following medications:   Start the following medications: Meds ordered this encounter  Medications   HYDROmorphone  (DILAUDID ) 2 MG tablet    Sig: Take 1 tablet (2 mg total) by mouth 2 (two) times daily as needed for severe pain (pain score 7-10).    Dispense:  60 tablet    Refill:  0     Follow up: 4 weeks   We look forward to seeing you next time. Please call our clinic at [phone] if you have any questions or concerns. The best time to call is Monday-Friday from 9am-4pm, but there is someone available 24/7. If after hours or the weekend, call the main hospital number and ask for the Internal Medicine Resident On-Call. If you need medication refills, please notify your pharmacy one week in advance and they will send us  a request.   Thank you for trusting me with your care. Wishing you the best!   Dorthy Gavia, MD [facility]

## 2024-03-12 ENCOUNTER — Other Ambulatory Visit: Payer: Self-pay | Admitting: Student

## 2024-03-12 ENCOUNTER — Encounter: Payer: Self-pay | Admitting: Cardiovascular Disease

## 2024-03-12 ENCOUNTER — Ambulatory Visit: Attending: Cardiovascular Disease | Admitting: Cardiovascular Disease

## 2024-03-12 ENCOUNTER — Telehealth: Payer: Self-pay | Admitting: Student

## 2024-03-12 VITALS — BP 118/72 | HR 64 | Ht 66.0 in | Wt 136.0 lb

## 2024-03-12 DIAGNOSIS — I5020 Unspecified systolic (congestive) heart failure: Secondary | ICD-10-CM

## 2024-03-12 DIAGNOSIS — Z79899 Other long term (current) drug therapy: Secondary | ICD-10-CM

## 2024-03-12 DIAGNOSIS — J45909 Unspecified asthma, uncomplicated: Secondary | ICD-10-CM

## 2024-03-12 DIAGNOSIS — E785 Hyperlipidemia, unspecified: Secondary | ICD-10-CM

## 2024-03-12 DIAGNOSIS — I480 Paroxysmal atrial fibrillation: Secondary | ICD-10-CM

## 2024-03-12 DIAGNOSIS — I10 Essential (primary) hypertension: Secondary | ICD-10-CM

## 2024-03-12 NOTE — Assessment & Plan Note (Signed)
 History of heart failure with reduced EF with echo performed 10/10/2023 revealing EF 35 to 40% range with severely dilated left ventricle and moderate pulmonary hypertension.  There were no valvular abnormalities.  She is on losartan  and Imdur.  Her beta-blocker was fairly recently discontinued.

## 2024-03-12 NOTE — Patient Instructions (Signed)

## 2024-03-12 NOTE — Assessment & Plan Note (Signed)
 History of essential hypertension with blood pressure measured today at 118/72.  She is on hydralazine , Imdur, and losartan .  She was on carvedilol  which apparently has been discontinued.

## 2024-03-12 NOTE — Progress Notes (Signed)
 03/12/2024 Audrey Hill   [birth year]  [id number]  Primary Physician Jonelle Neri, DO Primary Cardiologist: Avanell Leigh MD Bennye Bravo, [place]  HPI:  Audrey Hill is a 68 y.o. 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  She is referred by her PCP, Dr. Lydia Sams, for cardiovascular evaluation.  Risk factors include treated hypertension hyperlipidemia.  Apparently her mother and sister both had myocardial infarction's.  She has never had a heart attack or stroke.  She has been on hemodialysis for a year.  She walks with a walker because of weakness and pain in her legs.  She has been seen by Dr. Rosalva Comber, vascular surgery for "steal syndrome and has undergone upper extremity angiography.  She had a cardiac PET study performed 02/20/2024 that revealed an EF in the 30% range without perfusion abnormalities.  She wore a monitor that showed 1 episode of nonsustained ventricular tachycardia up to 10 episodes of atrial tachycardia.  A 2D echo revealed an EF in the 35% range with severe left ventricular enlargement and moderate pulmonary hypertension.  She lives at home with her grandson and she is minimally ambulatory.  She denies chest pain or shortness of breath.   Current Meds  Medication Sig   albuterol  (VENTOLIN  HFA) 108 (90 Base) MCG/ACT inhaler Inhale 2 puffs into the lungs every 6 (six) hours as needed (Cough).   amiodarone  (PACERONE ) 200 MG tablet Take 1 tablet (200 mg total) by mouth daily.   atorvastatin  (LIPITOR) 40 MG tablet Take 1 tablet (40 mg total) by mouth daily.   Calcium  Acetate 667 MG TABS Take 667 mg by mouth 5 (five) times daily.   diclofenac  Sodium (VOLTAREN ) 1 % GEL Apply 4 g topically 4 (four) times daily.   Ensure (ENSURE) Take 237 mLs by mouth 3 (three) times daily between meals.   ferrous sulfate 325 (65 FE) MG tablet Take 325 mg by mouth every other day.    furosemide  (LASIX ) 80 MG tablet Take 1 tablet (80 mg total) by mouth daily.   hydrALAZINE  (APRESOLINE ) 25 MG tablet Take 1 tablet (25 mg total) by mouth 3 (three) times daily.   HYDROmorphone  (DILAUDID ) 2 MG tablet Take 1 tablet (2 mg total) by mouth 2 (two) times daily as needed for severe pain (pain score 7-10).   isosorbide  dinitrate (ISORDIL ) 20 MG tablet Take 1 tablet (20 mg total) by mouth 3 (three) times daily.   lidocaine  (LIDODERM ) 5 % Place 1 patch onto the skin daily as needed. Remove & Discard patch within 12 hours or as directed by MD   losartan  (COZAAR ) 50 MG tablet Take 1 tablet (50 mg total) by mouth daily.   multivitamin (RENA-VIT) TABS tablet Take 1 tablet by mouth daily.   pantoprazole  (PROTONIX ) 40 MG tablet Take 1 tablet (40 mg total) by mouth daily.   polyethylene glycol (MIRALAX  / GLYCOLAX ) 17 g packet Take 17 g by mouth daily.   senna (SENOKOT) 8.6 MG TABS tablet Take 2 tablets by mouth daily as needed for mild constipation.   sertraline  (ZOLOFT ) 25 MG tablet Take 1 tablet (25 mg total) by mouth daily.   sevelamer  carbonate (RENVELA ) 800 MG tablet Take 1 tablet (800 mg total) by mouth 3 (three) times daily with meals.   traZODone  (DESYREL ) 50 MG tablet Take 1 tablet (50 mg total) by mouth at bedtime.   triamcinolone (KENALOG) 0.025 % ointment Apply  1 Application topically daily as needed (shin).     Allergies  Allergen Reactions   Diphenhydramine  Hcl     Other Reaction(s): Other (See Comments)  "mini stroke"    Other reaction(s): Other (See Comments) "mini stroke" (Benadryl )   Lisinopril Rash and Swelling   Metoclopramide      Other reaction(s): Other Patient has dystonia with administration of drug.  Other reaction(s): Other (See Comments) Patient has dystonia with administration of drug.  Patient has dystonia with administration of drug.     Nortriptyline     Other reaction(s): Other (See Comments) unknown "Mini stroke"    Tramadol Nausea And Vomiting         Nitroglycerin  Hives and Rash   Ibuprofen Other (See Comments)    Upset gi   Norvasc  [Amlodipine  Besylate] Swelling   Nsaids Other (See Comments)    GI upset    Social History   Socioeconomic History   Marital status: Single    Spouse name: Not on file   Number of children: Not on file   Years of education: Not on file   Highest education level: Not on file  Occupational History   Occupation: Unemplyed    Employer: RETIRED  Tobacco Use   Smoking status: Former    Current packs/day: 0.00    Average packs/day: 0.5 packs/day for 40.0 years (20.0 ttl pk-yrs)    Types: Cigarettes    Start date: 10/04/1973    Quit date: 10/04/2013    Years since quitting: 10.4   Smokeless tobacco: Never   Tobacco comments:    form given 06/18/13  Vaping Use   Vaping status: Never Used  Substance and Sexual Activity   Alcohol  use: No   Drug use: No   Sexual activity: Not Currently  Other Topics Concern   Not on file  Social History Narrative   Pt lives with son and brother. Home has 3 stairs into it. No issue. Has HS degree.    Social Drivers of Corporate investment banker Strain: Not on file  Food Insecurity: No Food Insecurity (02/29/2024)   Hunger Vital Sign    Worried About Running Out of Food in the Last Year: Never true    Ran Out of Food in the Last Year: Never true  Transportation Needs: No Transportation Needs (02/29/2024)   PRAPARE - Administrator, Civil Service (Medical): No    Lack of Transportation (Non-Medical): No  Physical Activity: Not on file  Stress: Not on file  Social Connections: Socially Isolated (02/29/2024)   Social Connection and Isolation Panel [NHANES]    Frequency of Communication with Friends and Family: Three times a week    Frequency of Social Gatherings with Friends and Family: Three times a week    Attends Religious Services: Never    Active Member of Clubs or Organizations: No    Attends Banker Meetings: Never    Marital  Status: Never married  Intimate Partner Violence: Not At Risk (02/29/2024)   Humiliation, Afraid, Rape, and Kick questionnaire    Fear of Current or Ex-Partner: No    Emotionally Abused: No    Physically Abused: No    Sexually Abused: No     Review of Systems: General: negative for chills, fever, night sweats or weight changes.  Cardiovascular: negative for chest pain, dyspnea on exertion, edema, orthopnea, palpitations, paroxysmal nocturnal dyspnea or shortness of breath Dermatological: negative for rash Respiratory: negative for cough or wheezing Urologic: negative for hematuria  Abdominal: negative for nausea, vomiting, diarrhea, bright red blood per rectum, melena, or hematemesis Neurologic: negative for visual changes, syncope, or dizziness All other systems reviewed and are otherwise negative except as noted above.    Blood pressure 118/72, pulse 64, height 5\' 6"  (1.676 m), weight 136 lb (61.7 kg), SpO2 95%.  General appearance: alert and no distress Neck: no adenopathy, no carotid bruit, no JVD, supple, symmetrical, trachea midline, and thyroid  not enlarged, symmetric, no tenderness/mass/nodules Lungs: clear to auscultation bilaterally Heart: regular rate and rhythm, S1, S2 normal, no murmur, click, rub or gallop Extremities: extremities normal, atraumatic, no cyanosis or edema Pulses: 2+ and symmetric Skin: Skin color, texture, turgor normal. No rashes or lesions Neurologic: Grossly normal  EKG EKG Interpretation Date/Time:  Monday March 12 2024 10:54:31 EDT Ventricular Rate:  64 PR Interval:  168 QRS Duration:  122 QT Interval:  470 QTC Calculation: 484 R Axis:   -28  Text Interpretation: Normal sinus rhythm Possible Left atrial enlargement Left ventricular hypertrophy with QRS widening ( Cornell product ) Cannot rule out Septal infarct , age undetermined Possible Lateral infarct , age undetermined When compared with ECG of 28-Feb-2024 17:28, Premature atrial complexes  are no longer Present Left anterior fascicular block is no longer Present Borderline criteria for Lateral infarct are now Present Confirmed by Lauro Portal [phone]) on 03/12/2024 10:58:57 AM    ASSESSMENT AND PLAN:   Hypertension History of essential hypertension with blood pressure measured today at 118/72.  She is on hydralazine , Imdur, and losartan .  She was on carvedilol  which apparently has been discontinued.  Hyperlipidemia History of hyperlipidemia on statin therapy lipid profile performed 08/05/2022 revealing total cholesterol 13, LDL 34 HDL 72.  Atrial fibrillation (HCC) History of A-fib currently in sinus rhythm on amiodarone .  She currently is not on oral anticoagulant at the current time.  HFrEF (heart failure with reduced ejection fraction) (HCC) History of heart failure with reduced EF with echo performed 10/10/2023 revealing EF 35 to 40% range with severely dilated left ventricle and moderate pulmonary hypertension.  There were no valvular abnormalities.  She is on losartan  and Imdur.  Her beta-blocker was fairly recently discontinued.     Avanell Leigh MD FACP,FACC,FAHA, [facility] 03/12/2024 11:12 AM

## 2024-03-12 NOTE — Telephone Encounter (Signed)
Case management referral placed

## 2024-03-12 NOTE — Assessment & Plan Note (Signed)
 History of hyperlipidemia on statin therapy lipid profile performed 08/05/2022 revealing total cholesterol 13, LDL 34 HDL 72.

## 2024-03-12 NOTE — Assessment & Plan Note (Signed)
 History of A-fib currently in sinus rhythm on amiodarone .  She currently is not on oral anticoagulant at the current time.

## 2024-03-13 ENCOUNTER — Telehealth: Payer: Self-pay | Admitting: *Deleted

## 2024-03-13 NOTE — Progress Notes (Unsigned)
 Complex Care Management Note Care Guide Note  03/13/2024 Name: Audrey Hill MRN: [id number] DOB: April 23, 1956   Complex Care Management Outreach Attempts: An unsuccessful telephone outreach was attempted today to offer the patient information about available complex care management services.  Follow Up Plan:  Additional outreach attempts will be made to offer the patient complex care management information and services.   Encounter Outcome:  No Answer  Barnie Bora  [facility]  [facility], [facility] Guide  Direct Dial : [phone]  Fax [phone]

## 2024-03-13 NOTE — Telephone Encounter (Signed)
 Medication sent to pharmacy

## 2024-03-14 NOTE — Progress Notes (Signed)
 Internal Medicine Clinic Attending  Case discussed with the resident at the time of the visit.  We reviewed the resident's history and exam and pertinent patient test results.  I agree with the assessment, diagnosis, and plan of care documented in the resident's note.

## 2024-03-14 NOTE — Progress Notes (Signed)
 Complex Care Management Note Care Guide Note  03/14/2024 Name: Audrey Hill MRN: [id number] DOB: 09-04-1956   Complex Care Management Outreach Attempts: A second unsuccessful outreach was attempted today to offer the patient with information about available complex care management services.  Follow Up Plan:  Additional outreach attempts will be made to offer the patient complex care management information and services.   Encounter Outcome:  No Answer  Barnie Bora  [facility]  [facility], [facility] Guide  Direct Dial : [phone]  Fax [phone]

## 2024-03-14 NOTE — Addendum Note (Signed)
 Addended by: Bevelyn Bryant on: 03/14/2024 08:11 AM   Modules accepted: Level of Service

## 2024-03-15 NOTE — Progress Notes (Signed)
 Complex Care Management Note  Care Guide Note 03/15/2024 Name: Audrey Hill MRN: [id number] DOB: 09/02/1956  Audrey Hill is a 68 y.o. year old female who sees Jonelle Neri, DO for primary care. I reached out to Annamaria Barrette by phone today to offer complex care management services.  Ms. Madonna was given information about Complex Care Management services today including:   The Complex Care Management services include support from the care team which includes your Nurse Care Manager, Clinical Social Worker, or Pharmacist.  The Complex Care Management team is here to help remove barriers to the health concerns and goals most important to you. Complex Care Management services are voluntary, and the patient may decline or stop services at any time by request to their care team member.   Complex Care Management Consent Status: Patient agreed to services and verbal consent obtained.   Follow up plan:  Telephone appointment with complex care management team member scheduled for:  03/30/24  Encounter Outcome:  Patient Scheduled  Barnie Bora  [facility]  [facility], [facility] Guide  Direct Dial : [phone]  Fax [phone]

## 2024-03-27 ENCOUNTER — Ambulatory Visit: Payer: Self-pay | Admitting: *Deleted

## 2024-03-27 ENCOUNTER — Other Ambulatory Visit: Payer: Self-pay | Admitting: Student

## 2024-03-27 DIAGNOSIS — K2100 Gastro-esophageal reflux disease with esophagitis, without bleeding: Secondary | ICD-10-CM

## 2024-03-27 MED ORDER — PANTOPRAZOLE SODIUM 40 MG PO TBEC: 40.0000 mg | DELAYED_RELEASE_TABLET | Freq: Every day | ORAL | 0 refills | Status: DC

## 2024-03-27 NOTE — Telephone Encounter (Signed)
 Returned call to the patient she stated she is having a lot of pain. Patient is scheduled to come in on 7/2 with Dr.Azadegan.

## 2024-03-27 NOTE — Telephone Encounter (Signed)
 Copied from CRM [id number]. Topic: Clinical - Medication Refill >> Mar 27, 2024  2:31 PM [name] wrote: Medication: pantoprazole  (PROTONIX ) 40 MG tablet   Has the patient contacted their pharmacy? Yes (Agent: If no, request that the patient contact the pharmacy for the refill. If patient does not wish to contact the pharmacy document the reason why and proceed with request.) (Agent: If yes, when and what did the pharmacy advise?)  This is the patient's preferred pharmacy:  [facility], [place] - [address] [place] [address] Phone: [phone] Fax: [phone]  [id number] - [address] [place], [place] - [address] [place] [address] Phone: [phone] Fax: [phone]  [facility] [place]  - [name] - [address] [place] [address] Phone: [phone] Fax: [phone]  Is this the correct pharmacy for this prescription? Yes If no, delete pharmacy and type the correct one.   Has the prescription been filled recently? Yes  Is the patient out of the medication? No  Has the patient been seen for an appointment in the last year OR does the patient have an upcoming appointment? Yes  Can we respond through MyChart? Yes  Agent: Please be advised that Rx refills may take up to 3 business days. We ask that you follow-up with your pharmacy.

## 2024-03-27 NOTE — Telephone Encounter (Signed)
 FYI Only or Action Required?: Action required by provider: medication request.  Patient was last seen in primary care on 03/09/2024 by Gregary Sharper, MD. Called Nurse Triage reporting Pain. Symptoms began chronic pain- bursitis . Interventions attempted: Other: patient had 2 injections yesterday- reporting they did not help. Symptoms are: unchanged.  Triage Disposition: See PCP Within 2 Weeks  Patient/caregiver understands and will follow disposition? Patient call kept dropping- multiple callbacks to finish triage- Patient advised to contact specialist- she has contacted and awaiting call back. Patient advised I would send message to PCP for follow up to call.    Reason for Disposition  [1] Pelvic pain is a chronic symptom (recurrent or ongoing AND [2] present > 4 weeks)  Answer Assessment - Initial Assessment Questions 1. LOCATION: Where does it hurt?      Acute on Chronic pain : trochanteric bursitis- hips and legs 2. RADIATION: Does the pain shoot anywhere else? (e.g., lower back, groin, thighs)     Radiates in legs 3. ONSET: When did the pain begin? (e.g., minutes, hours or days ago)      Chronic- over 6 months 4. SUDDEN: Gradual or sudden onset?     na 5. PATTERN Does the pain come and go, or is it constant?    - If constant: Is it getting better, staying the same, or worsening?      (Note: Constant means the pain never goes away completely; most serious pain is constant and gets worse over time)     - If intermittent: How long does it last? Do you have pain now?     (Note: Intermittent means the pain goes away completely between bouts)     Constant pain 6. SEVERITY: How bad is the pain?  (e.g., Scale 1-10; mild, moderate, or severe)   - MILD (1-3): doesn't interfere with normal activities, area soft and not tender to touch    - MODERATE (4-7): interferes with normal activities or awakens from sleep, abdomen tender to touch    - SEVERE (8-10): excruciating pain,  doubled over, unable to do any normal activities      Patient had injections yesterday- has not helped 7. RECURRENT SYMPTOM: Have you ever had this type of pelvic pain before? If Yes, ask: When was the last time? and What happened that time?      Chronic pain 8. CAUSE: What do you think is causing the pelvic pain?     bursitis 9. RELIEVING/AGGRAVATING FACTORS: What makes it better or worse? (e.g., activity/rest, sexual intercourse, voiding, passing stool)     Nothing is making it better 10. OTHER SYMPTOMS: Has there been any other symptoms? (e.g., fever, constipation, diarrhea, urine problems, vaginal bleeding, vaginal discharge, or vomiting?       Abdominal pain, constipation, diarrhea   Patient reports she had injections yesterday and they did not help- she is requesting pain medication.  Protocols used: Pelvic Pain - [facility]    Copied from CRM [id number]. Topic: Clinical - Red Word Triage >> Mar 27, 2024  2:18 PM Alfonso ORN wrote: Red Word that prompted transfer to Nurse Triage: patient requesting muscle relaxing , pain all over her body  rate pain  patient call back   [phone]

## 2024-03-28 ENCOUNTER — Encounter (HOSPITAL_COMMUNITY): Payer: Self-pay

## 2024-03-28 ENCOUNTER — Other Ambulatory Visit: Payer: Self-pay

## 2024-03-28 ENCOUNTER — Emergency Department (HOSPITAL_COMMUNITY)

## 2024-03-28 ENCOUNTER — Emergency Department (HOSPITAL_COMMUNITY)
Admission: EM | Admit: 2024-03-28 | Discharge: 2024-03-28 | Disposition: A | Discharge status: Home / Self Care | Attending: Emergency Medicine | Admitting: Emergency Medicine

## 2024-03-28 DIAGNOSIS — R55 Syncope and collapse: Secondary | ICD-10-CM | POA: Insufficient documentation

## 2024-03-28 DIAGNOSIS — Z992 Dependence on renal dialysis: Secondary | ICD-10-CM | POA: Insufficient documentation

## 2024-03-28 DIAGNOSIS — I132 Hypertensive heart and chronic kidney disease with heart failure and with stage 5 chronic kidney disease, or end stage renal disease: Secondary | ICD-10-CM | POA: Insufficient documentation

## 2024-03-28 DIAGNOSIS — G894 Chronic pain syndrome: Secondary | ICD-10-CM | POA: Diagnosis not present

## 2024-03-28 DIAGNOSIS — Z79899 Other long term (current) drug therapy: Secondary | ICD-10-CM | POA: Insufficient documentation

## 2024-03-28 DIAGNOSIS — N189 Chronic kidney disease, unspecified: Secondary | ICD-10-CM

## 2024-03-28 DIAGNOSIS — R7401 Elevation of levels of liver transaminase levels: Secondary | ICD-10-CM | POA: Diagnosis not present

## 2024-03-28 DIAGNOSIS — I5020 Unspecified systolic (congestive) heart failure: Secondary | ICD-10-CM | POA: Insufficient documentation

## 2024-03-28 DIAGNOSIS — E871 Hypo-osmolality and hyponatremia: Secondary | ICD-10-CM | POA: Diagnosis not present

## 2024-03-28 DIAGNOSIS — W19XXXA Unspecified fall, initial encounter: Secondary | ICD-10-CM | POA: Diagnosis not present

## 2024-03-28 DIAGNOSIS — R748 Abnormal levels of other serum enzymes: Secondary | ICD-10-CM | POA: Insufficient documentation

## 2024-03-28 DIAGNOSIS — N186 End stage renal disease: Secondary | ICD-10-CM | POA: Diagnosis not present

## 2024-03-28 DIAGNOSIS — Y92009 Unspecified place in unspecified non-institutional (private) residence as the place of occurrence of the external cause: Secondary | ICD-10-CM | POA: Insufficient documentation

## 2024-03-28 DIAGNOSIS — J45909 Unspecified asthma, uncomplicated: Secondary | ICD-10-CM | POA: Insufficient documentation

## 2024-03-28 DIAGNOSIS — D696 Thrombocytopenia, unspecified: Secondary | ICD-10-CM | POA: Diagnosis not present

## 2024-03-28 DIAGNOSIS — R7989 Other specified abnormal findings of blood chemistry: Secondary | ICD-10-CM

## 2024-03-28 LAB — CBC WITH DIFFERENTIAL/PLATELET
Abs Immature Granulocytes: 0.01 10*3/uL (ref 0.00–0.07)
Basophils Absolute: 0 10*3/uL (ref 0.0–0.1)
Basophils Relative: 1 %
Eosinophils Absolute: 0.1 10*3/uL (ref 0.0–0.5)
Eosinophils Relative: 2 %
HCT: 32.4 % — ABNORMAL LOW (ref 36.0–46.0)
Hemoglobin: 10.3 g/dL — ABNORMAL LOW (ref 12.0–15.0)
Immature Granulocytes: 0 %
Lymphocytes Relative: 14 %
Lymphs Abs: 0.7 10*3/uL (ref 0.7–4.0)
MCH: 26.3 pg (ref 26.0–34.0)
MCHC: 31.8 g/dL (ref 30.0–36.0)
MCV: 82.9 fL (ref 80.0–100.0)
Monocytes Absolute: 0.6 10*3/uL (ref 0.1–1.0)
Monocytes Relative: 12 %
Neutro Abs: 3.5 10*3/uL (ref 1.7–7.7)
Neutrophils Relative %: 71 %
Platelets: 125 10*3/uL — ABNORMAL LOW (ref 150–400)
RBC: 3.91 MIL/uL (ref 3.87–5.11)
RDW: 17.8 % — ABNORMAL HIGH (ref 11.5–15.5)
WBC: 5 10*3/uL (ref 4.0–10.5)
nRBC: 0 % (ref 0.0–0.2)

## 2024-03-28 LAB — COMPREHENSIVE METABOLIC PANEL WITH GFR
ALT: 69 U/L — ABNORMAL HIGH (ref 0–44)
AST: 58 U/L — ABNORMAL HIGH (ref 15–41)
Albumin: 3.1 g/dL — ABNORMAL LOW (ref 3.5–5.0)
Alkaline Phosphatase: 197 U/L — ABNORMAL HIGH (ref 38–126)
Anion gap: 9 (ref 5–15)
BUN: 22 mg/dL (ref 8–23)
CO2: 28 mmol/L (ref 22–32)
Calcium: 8.8 mg/dL — ABNORMAL LOW (ref 8.9–10.3)
Chloride: 97 mmol/L — ABNORMAL LOW (ref 98–111)
Creatinine, Ser: 5.06 mg/dL — ABNORMAL HIGH (ref 0.44–1.00)
GFR, Estimated: 9 mL/min — ABNORMAL LOW (ref >60)
Glucose, Bld: 74 mg/dL (ref 70–99)
Potassium: 3.7 mmol/L (ref 3.5–5.1)
Sodium: 134 mmol/L — ABNORMAL LOW (ref 135–145)
Total Bilirubin: 0.8 mg/dL (ref 0.0–1.2)
Total Protein: 7.2 g/dL (ref 6.5–8.1)

## 2024-03-28 LAB — LIPASE, BLOOD: Lipase: 60 U/L — ABNORMAL HIGH (ref 11–51)

## 2024-03-28 LAB — TROPONIN I (HIGH SENSITIVITY)
Troponin I (High Sensitivity): 111 ng/L (ref <18)
Troponin I (High Sensitivity): 115 ng/L (ref <18)

## 2024-03-28 MED ORDER — MORPHINE SULFATE (PF) 4 MG/ML IV SOLN
4.0000 mg | Freq: Once | INTRAVENOUS | Status: AC
  Administered 2024-03-28: 4 mg via INTRAVENOUS
  Filled 2024-03-28: qty 1

## 2024-03-28 MED ORDER — ONDANSETRON HCL 4 MG/2ML IJ SOLN
4.0000 mg | Freq: Once | INTRAMUSCULAR | Status: AC
  Administered 2024-03-28: 4 mg via INTRAVENOUS
  Filled 2024-03-28: qty 2

## 2024-03-28 MED ORDER — IOHEXOL 350 MG/ML SOLN
75.0000 mL | Freq: Once | INTRAVENOUS | Status: AC | PRN
  Administered 2024-03-28: 75 mL via INTRAVENOUS

## 2024-03-28 NOTE — Discharge Instructions (Addendum)
 Continue with your chronic pain medications.  Return to the emergency department if you have new or concerning symptoms.  Make sure you go for your scheduled dialysis.

## 2024-03-28 NOTE — ED Notes (Addendum)
 Pt O2 87% on room air. Pt placed back on 2L O2.

## 2024-03-28 NOTE — ED Provider Notes (Signed)
  EMERGENCY DEPARTMENT AT [facility] Provider Note   CSN: [id number] Arrival date & time: 03/28/24  0009     Patient presents with: Chief complaint: Abdominal pain, syncope  Audrey Hill is a 68 y.o. female.   The history is provided by the patient.  She has history of hypertension, hyperlipidemia, end-stage renal disease on hemodialysis, heart failure with reduced ejection fraction, GERD, asthma and is brought in by ambulance after having a fall at home.  She states that she was on the commode urinating and she thinks she blacked out.  She denies chest pain or palpitations.  She states she did not hit her head, is not complaining of any head pain, is not on any anticoagulants she is complaining of generalized abdominal pain with nausea and vomiting.  Abdominal pain started today.  She denies fever or chills.  Of note, she has been on dialysis for greater than 1 year.   Prior to Admission medications   Medication Sig Start Date End Date Taking? Authorizing Provider  albuterol  (VENTOLIN  HFA) 108 (90 Base) MCG/ACT inhaler INHALE 2 PUFFS BY MOUTH EVERY 6 HOURS AS NEEDED FOR COUGH 03/13/24   [name], DO  amiodarone  (PACERONE ) 200 MG tablet Take 1 tablet (200 mg total) by mouth daily. 11/11/23 05/09/24  [name], DO  atorvastatin  (LIPITOR) 40 MG tablet Take 1 tablet (40 mg total) by mouth daily. 12/16/23   [name], DO  Calcium  Acetate 667 MG TABS Take 667 mg by mouth 5 (five) times daily.    [provider]  carvedilol  (COREG ) 3.125 MG tablet Take 1 tablet (3.125 mg total) by mouth 2 (two) times daily with a meal. Patient not taking: Reported on 03/12/2024 01/27/24   [name], DO  diclofenac  Sodium (VOLTAREN ) 1 % GEL Apply 4 g topically 4 (four) times daily. 12/09/23   Amoako, Prince, MD  Ensure (ENSURE) Take 237 mLs by mouth 3 (three) times daily between meals.    [provider]  ferrous sulfate 325 (65 FE) MG tablet Take 325 mg by mouth every  other day.    [provider]  furosemide  (LASIX ) 80 MG tablet Take 1 tablet (80 mg total) by mouth daily. 05/26/23 02/28/25  Gabino Boga, MD  hydrALAZINE  (APRESOLINE ) 25 MG tablet Take 1 tablet (25 mg total) by mouth 3 (three) times daily. 03/03/24   Zheng, Michael, DO  HYDROmorphone  (DILAUDID ) 2 MG tablet Take 1 tablet (2 mg total) by mouth 2 (two) times daily as needed for severe pain (pain score 7-10). 03/09/24 04/08/24  Gregary Sharper, MD  isosorbide  dinitrate (ISORDIL ) 20 MG tablet Take 1 tablet (20 mg total) by mouth 3 (three) times daily. 01/27/24   [name], DO  lidocaine  (LIDODERM ) 5 % Place 1 patch onto the skin daily as needed. Remove & Discard patch within 12 hours or as directed by MD 03/03/24   Zheng, Michael, DO  losartan  (COZAAR ) 50 MG tablet Take 1 tablet (50 mg total) by mouth daily. 11/11/23 11/10/24  [name], DO  multivitamin (RENA-VIT) TABS tablet Take 1 tablet by mouth daily. 01/27/24   [name], DO  pantoprazole  (PROTONIX ) 40 MG tablet Take 1 tablet (40 mg total) by mouth daily. 03/27/24   [name], DO  polyethylene glycol (MIRALAX  / GLYCOLAX ) 17 g packet Take 17 g by mouth daily.    [provider]  senna (SENOKOT) 8.6 MG TABS tablet Take 2 tablets by mouth daily as needed for mild constipation. 12/19/23  [provider]  sertraline  (ZOLOFT ) 25 MG tablet Take 1 tablet (25 mg total) by mouth daily. 02/06/24 05/06/24  [name], DO  sevelamer  carbonate (RENVELA ) 800 MG tablet Take 1 tablet (800 mg total) by mouth 3 (three) times daily with meals. 02/09/24   [name], DO  traZODone  (DESYREL ) 50 MG tablet Take 1 tablet (50 mg total) by mouth at bedtime. 03/03/24 03/03/25  Zheng, Michael, DO  triamcinolone (KENALOG) 0.025 % ointment Apply 1 Application topically daily as needed (shin). 12/13/23   [provider]  promethazine  (PHENERGAN ) 12.5 MG suppository Place 1 suppository (12.5 mg total) rectally every 8 (eight) hours as needed. Patient  not taking: Reported on 11/13/2014 09/15/14 06/18/15  Palumbo, April, MD    Allergies: Diphenhydramine  hcl, Lisinopril, Metoclopramide , Nortriptyline, Tramadol, Nitroglycerin , Ibuprofen, Norvasc  [amlodipine  besylate], and Nsaids    Review of Systems  All other systems reviewed and are negative.   Updated Vital Signs BP (!) 178/97 (BP Location: Right Arm)   Pulse 73   Temp 98.5 F (36.9 C) (Oral)   Resp (!) 27   SpO2 98%   Physical Exam Vitals and nursing note reviewed.   68 year old female, resting comfortably and in no acute distress. Vital signs are significant for elevated respiratory rate and blood pressure. Oxygen saturation is 98%, which is normal. Head is normocephalic and atraumatic. PERRLA, EOMI. Oropharynx is clear. Neck is nontender and supple without adenopathy or JVD. Back is mildly tender in the lumbar spine. Lungs are clear without rales, wheezes, or rhonchi. Chest is nontender. Heart has regular rate and rhythm without murmur. Abdomen is soft, flat, with diffuse tenderness across the mid and lower abdomen.  There is no rebound or guarding. Extremities have no cyanosis or edema, full range of motion is present.  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 Skin is warm and dry without rash. Neurologic: Awake and alert, moves all extremities equally.  (all labs ordered are listed, but only abnormal results are displayed) Labs Reviewed  COMPREHENSIVE METABOLIC PANEL WITH GFR - Abnormal; Notable for the following components:      Result Value   Sodium 134 (*)    Chloride 97 (*)    Creatinine, Ser 5.06 (*)    Calcium  8.8 (*)    Albumin 3.1 (*)    AST 58 (*)    ALT 69 (*)    Alkaline Phosphatase 197 (*)    GFR, Estimated 9 (*)    All other components within normal limits  LIPASE, BLOOD - Abnormal; Notable for the following components:   Lipase 60 (*)    All other components within normal limits  CBC WITH DIFFERENTIAL/PLATELET - Abnormal;  Notable for the following components:   Hemoglobin 10.3 (*)    HCT 32.4 (*)    RDW 17.8 (*)    Platelets 125 (*)    All other components within normal limits  TROPONIN I (HIGH SENSITIVITY) - Abnormal; Notable for the following components:   Troponin I (High Sensitivity) 111 (*)    All other components within normal limits  TROPONIN I (HIGH SENSITIVITY) - Abnormal; Notable for the following components:   Troponin I (High Sensitivity) 115 (*)    All other components within normal limits    EKG: ARAYA, ROEL PI:[id number] 28-Mar-2024 01:21:30 [facility] ROUTINE RECORD [birth year] (67 yr) Female Black Room:[id number] Loc:0 Technician: [id number] Test ind: Vent. rate 74 BPM PR interval 166 ms QRS duration 126 ms QT/QTcB 495/550 ms  P-R-T axes 50 -18 79 Sinus rhythm Left atrial enlargement Left bundle branch block When compared with ECG of 03/12/2024, No significant change was found Confirmed by Raford Lenis ([id number]) on 03/28/2024 1:30:11 AM Confirmed By: Lenis Raford  Radiology: DG Knee Complete 4 Views Left Result Date: 03/28/2024 EXAM: 4 VIEW(S) XRAY OF THE LEFT KNEE 03/28/2024 04:28:30 AM COMPARISON: None available. CLINICAL HISTORY: Pain. Arrived from home. Had a fall tonight, was able to get herself up, complaining of right knee pain. FINDINGS: BONES AND JOINTS: No acute fracture. No focal osseous lesion. No joint dislocation. No joint effusion. Moderate degenerative changes are most evident in the medial and patellofemoral compartments. SOFT TISSUES: Medial soft tissue swelling is present. IMPRESSION: 1. No acute fracture. 2. Moderate degenerative changes in the medial and patellofemoral compartments. 3. Medial soft tissue swelling. No joint effusion. Electronically signed by: Lonni Necessary MD 03/28/2024 04:35 AM EDT RP Workstation: [id number]   CT ABDOMEN PELVIS W CONTRAST Result Date: 03/28/2024 CLINICAL DATA:  Left lower quadrant abdominal pain EXAM: CT  ABDOMEN AND PELVIS WITH CONTRAST TECHNIQUE: Multidetector CT imaging of the abdomen and pelvis was performed using the standard protocol following bolus administration of intravenous contrast. RADIATION DOSE REDUCTION: This exam was performed according to the departmental dose-optimization program which includes automated exposure control, adjustment of the mA and/or kV according to patient size and/or use of iterative reconstruction technique. CONTRAST:  75mL OMNIPAQUE  IOHEXOL  350 MG/ML SOLN COMPARISON:  02/28/2024 FINDINGS: Lower chest: No acute abnormality.  Stable moderate cardiomegaly Hepatobiliary: Tiny cyst within the left hepatic lobe. Liver otherwise unremarkable. No intra or extrahepatic biliary ductal dilation. Gallbladder unremarkable. Pancreas: Unremarkable Spleen: Stable lobulated hypodense lesions are seen within the spleen, measuring up to 2.5 cm, demonstrating minimal enlargement since remote prior examination of 06/22/2017 where this measured 2.0 cm in greatest dimension most in keeping with a nonaggressive lesion such as a splenic hemangioma or potentially a lymphangioma. The spleen is otherwise unremarkable. Adrenals/Urinary Tract: The adrenal glands are unremarkable. The kidneys are atrophic bilaterally. Hypoenhancing lesion within the lower pole of the left kidney is not optimally characterized on this examination, but appears stable since immediate prior examination and represents an involuted simple cortical cyst noted on remote prior examination of 06/22/2017. The kidneys are otherwise unremarkable. The bladder is decompressed and is unremarkable. Stomach/Bowel: Stomach is within normal limits. Appendix appears normal. No evidence of bowel wall thickening, distention, or inflammatory changes. Vascular/Lymphatic: Aortic atherosclerosis. No enlarged abdominal or pelvic lymph nodes. Reproductive: Status post hysterectomy. No adnexal masses. Other: Pelvic floor relaxation is noted with moderate  middle and posterior compartment prolapse. 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 No acute bone abnormality. No lytic or blastic bone lesion. IMPRESSION: 1. No acute intra-abdominal pathology identified. 2. Pelvic floor relaxation with moderate middle and posterior compartment prolapse. 3. 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 4. Stable moderate cardiomegaly. 5. Stable lobulated hypodense lesions within the spleen, demonstrating minimal enlargement since remote prior examination of 06/22/2017 most in keeping with a nonaggressive lesion such as a splenic hemangioma or potentially a lymphangioma. 6. Aortic atherosclerosis. Aortic Atherosclerosis (ICD10-I70.0). Electronically Signed   By: Dorethia Molt M.D.   On: 03/28/2024 02:39     Procedures  Cardiac monitor shows normal sinus rhythm, per my interpretation. Medications Ordered in the ED  ondansetron  (ZOFRAN ) injection 4 mg (4 mg Intravenous Given 03/28/24  0116)  morphine  (PF) 4 MG/ML injection 4 mg (4 mg Intravenous Given 03/28/24 0117)  iohexol  (OMNIPAQUE ) 350 MG/ML injection 75 mL (75 mLs Intravenous Contrast Given 03/28/24 0221)  morphine  (PF) 4 MG/ML injection 4 mg (4 mg Intravenous Given 03/28/24 0318)                                    Medical Decision Making Amount and/or Complexity of Data Reviewed Labs: ordered. Radiology: ordered.  Risk Prescription drug management.   Fall with possible syncope.  No evidence of injury from fall.  Abdominal pain of uncertain cause.  Differential diagnosis includes, but is not limited to, diverticulitis, appendicitis, pancreatitis, cholecystitis, vascular insufficiency.  I have ordered morphine  for pain, ondansetron  for nausea.  I have ordered electrocardiogram and troponin as part of  evaluation of syncope and have ordered CT of abdomen and pelvis for evaluation of her abdominal pain.  I have reviewed her past records, and notes she is on oral hydromorphone  for management of chronic pain.  She had an ED visit on 10/29/2023 for fall and abdominal pain, had a hospitalization on 10/08/2023 for fall secondary to orthostatic hypotension.  I have reviewed her electrocardiogram, my interpretation is left bundle branch block unchanged from prior.  I have reviewed her laboratory tests my interpretation is mild hyponatremia which is not felt to be clinically significant, elevated creatinine consistent with known history of end-stage renal disease, chronic elevation of alkaline phosphatase, mild elevation of transaminases and lipase of uncertain clinical significance, elevated troponin which is decreased compared with 02/28/2024 and repeat shows no significant rise, stable anemia, stable thrombocytopenia.  Of note, troponins have been elevated since 08/02/2021.  CT of abdomen and pelvis shows no acute intra-abdominal pathology, x-rays of the left knee show degenerative changes but no acute injury.  Have independently viewed all of the images, and agree with the radiologist's interpretation.  No evidence that her pain is anything more than continuation of her chronic pain syndrome.  No evidence of serious pathology causing syncope.  She is safe for discharge.     Final diagnoses:  Syncope, unspecified syncope type  Chronic pain syndrome  End-stage renal disease on hemodialysis (HCC)  Hyponatremia  Elevated transaminase level  Elevated alkaline phosphatase level  Anemia associated with chronic renal failure  Thrombocytopenia (HCC)  Elevated troponin    ED Discharge Orders     None          Raford Lenis, MD 03/28/24 [phone]

## 2024-03-28 NOTE — ED Notes (Addendum)
 Pt O2 was 84% on room air after morphine  administration. Pt placed on 4L O2. O2 returned to normal limits.

## 2024-03-28 NOTE — ED Triage Notes (Signed)
 Pt arrived from home. Had a fall tonight, was able to get herself up complaining of knee and leg pain bilaterally. Said the legs normally hurt. She also reports nausea, vomiting, diarrhea, cough, and congestion for 2 days. Has dialysis Tue/Thurs/ Sat. Wears oxygen 3 liters as needed.

## 2024-03-30 ENCOUNTER — Telehealth: Payer: Self-pay

## 2024-03-30 NOTE — ED Provider Notes (Signed)
 [facility] Emergency Department Emergency Department Provider Note  This document was created using the aid of voice recognition Dragon dictation software.   Provider at bedside: 03/30/2024 7:58 AM  History obtained from the: Patient  History   Chief Complaint  Patient presents with  . Abdominal Pain    HPI  Audrey Hill is a 68 y.o. female who presents to the ED with complaints of acute on chronic pain.  Patient states she has pain to her abdomen and legs that has been ongoing for many months.  States they do not know the cause of this.  Was at [facility] 2 days ago with similar complaints of abdominal pain.  CT at that time was unremarkable.  Lab work fairly unremarkable.  She notes no new symptoms.  She states she is currently out of her Dilaudid  which she uses to treat her chronic pain, states she does not follow-up with a pain doctor for another week and a half.  No fevers no chills no diarrhea.  No other complaints at this time.  ______________________ ROS: Pertinent positives and negatives per HPI.  Physical Exam   Vitals:   03/30/24 0448 03/30/24 0500 03/30/24 0600 03/30/24 0700  BP: (!) 174/92 (!) 164/87 (!) 163/93 156/81  BP Location:      Patient Position:      Pulse: 75 76 71 65  Resp: (!) 24 (!) 21 (!) 25 (!) 23  Temp: 99.5 F (37.5 C)     TempSrc:      SpO2:  99% 100% 100%  Weight: 62.7 kg (138 lb 3.7 oz)     Height: 167.6 cm (5' 6)       Physical Exam Constitutional:      General: She is not in acute distress.    Appearance: Normal appearance. She is not ill-appearing.   Cardiovascular:     Rate and Rhythm: Normal rate and regular rhythm.  Pulmonary:     Effort: Pulmonary effort is normal. No respiratory distress.     Breath sounds: Normal breath sounds. No stridor. No wheezing, rhonchi or rales.  Abdominal:     General: There is no distension.     Tenderness: There is generalized abdominal tenderness. There is no  guarding or rebound.   Skin:    General: Skin is warm and dry.   Neurological:     General: No focal deficit present.     Mental Status: She is alert and oriented to person, place, and time.     ED Course     Medical Decision Making  Initial Intervention: CBC CMP ordered.  DDX: Chronic pain syndrome, gastroenteritis, diverticulitis, pancreatitis, gallstone, nephrolithiasis, pyelonephritis  Clinical Complexity  n/a   Patient's presentation is most consistent with acute presentation with potential threat to life or bodily function.  Patient's hx of chronic pain increases the complexity of managing their  presentation with abdominal and leg pain.    Provider time spent in patient care today, inclusive of but not limited to clinical reassessment, review of diagnostic studies, and discharge preparation, was greater than 30 minutes.   All Imaging and lab work personally viewed and interpreted by myself. My interpretations are as follow:  Patient presenting with abdominal and leg pain.  She has had a similar workup 2 days ago without any acute pathology noted on her CT.  She has a history of chronic pain for which she takes Dilaudid  but is currently out of her Dilaudid .  Her lab work  here is relatively unremarkable.  No profound abnormalities no white count no significant changes in her chronic anemia.  Kidney function at her baseline.  Very mild transaminitis that is likely not a significant finding.  Not significantly elevated at this time.  Unsure of the exact etiology of her pain but likely is derived from her chronic pain needs.  Do not think any further workup here is needed as she had a CT 2 days ago without any acute pathology.  Encouraged her follow-up with her pain doctors.  At this point I think is reasonable discharge home return precaution given.  Patient agreed with the plan.  Medical Decision Making Amount and/or Complexity of Data Reviewed Labs:  ordered.  Risk Prescription drug management.    ED Clinical Impression   1. Other chronic pain    FOLLOW UP [facility] -  EMERGENCY DEPARTMENT [address] [place]  [address] [phone]    [place] Sherlean Eastern, MD [address] [place] [address] [phone]  In 3 days    ED Disposition     None      _____________________________

## 2024-04-01 ENCOUNTER — Other Ambulatory Visit: Payer: Self-pay

## 2024-04-01 ENCOUNTER — Emergency Department (HOSPITAL_COMMUNITY)
Admission: EM | Admit: 2024-04-01 | Discharge: 2024-04-02 | Disposition: A | Discharge status: Home / Self Care | Attending: Emergency Medicine | Admitting: Emergency Medicine

## 2024-04-01 ENCOUNTER — Encounter (HOSPITAL_COMMUNITY): Payer: Self-pay | Admitting: *Deleted

## 2024-04-01 DIAGNOSIS — R10817 Generalized abdominal tenderness: Secondary | ICD-10-CM | POA: Insufficient documentation

## 2024-04-01 DIAGNOSIS — R112 Nausea with vomiting, unspecified: Secondary | ICD-10-CM | POA: Insufficient documentation

## 2024-04-01 DIAGNOSIS — R079 Chest pain, unspecified: Secondary | ICD-10-CM | POA: Diagnosis not present

## 2024-04-01 DIAGNOSIS — R059 Cough, unspecified: Secondary | ICD-10-CM | POA: Insufficient documentation

## 2024-04-01 DIAGNOSIS — G894 Chronic pain syndrome: Secondary | ICD-10-CM | POA: Insufficient documentation

## 2024-04-01 DIAGNOSIS — R7989 Other specified abnormal findings of blood chemistry: Secondary | ICD-10-CM | POA: Diagnosis not present

## 2024-04-01 DIAGNOSIS — Z79899 Other long term (current) drug therapy: Secondary | ICD-10-CM | POA: Diagnosis not present

## 2024-04-01 DIAGNOSIS — Z992 Dependence on renal dialysis: Secondary | ICD-10-CM | POA: Diagnosis not present

## 2024-04-01 MED ORDER — HYDROMORPHONE HCL 1 MG/ML IJ SOLN
1.0000 mg | Freq: Once | INTRAMUSCULAR | Status: AC
  Administered 2024-04-02: 1 mg via INTRAVENOUS
  Filled 2024-04-01: qty 1

## 2024-04-01 MED ORDER — ONDANSETRON HCL 4 MG/2ML IJ SOLN
4.0000 mg | Freq: Once | INTRAMUSCULAR | Status: AC
  Administered 2024-04-02: 4 mg via INTRAVENOUS
  Filled 2024-04-01: qty 2

## 2024-04-01 NOTE — ED Notes (Signed)
1 set of cultures sent to lab  

## 2024-04-01 NOTE — ED Provider Notes (Signed)
 [place] EMERGENCY DEPARTMENT AT [facility] Provider Note   CSN: [id number] Arrival date & time: 04/01/24  2310     Patient presents with: Chest Pain   Audrey Hill is a 68 y.o. female.  {Add pertinent medical, surgical, social history, OB history to YEP:67052} Patient presents to the emergency department with multiple complaints.  Patient reports that she has been experiencing nausea and vomiting.  Patient reports a history of chronic pain syndrome, probably has not been holding down her pain medications.  She is now hurting all over.  She is complaining of specific chest pain.  She has had cough and congestion.       Prior to Admission medications   Medication Sig Start Date End Date Taking? Authorizing Provider  albuterol  (VENTOLIN  HFA) 108 (90 Base) MCG/ACT inhaler INHALE 2 PUFFS BY MOUTH EVERY 6 HOURS AS NEEDED FOR COUGH 03/13/24   [name], DO  amiodarone  (PACERONE ) 200 MG tablet Take 1 tablet (200 mg total) by mouth daily. 11/11/23 05/09/24  [name], DO  atorvastatin  (LIPITOR) 40 MG tablet Take 1 tablet (40 mg total) by mouth daily. 12/16/23   [name], DO  Calcium  Acetate 667 MG TABS Take 667 mg by mouth 5 (five) times daily.    [provider]  carvedilol  (COREG ) 3.125 MG tablet Take 1 tablet (3.125 mg total) by mouth 2 (two) times daily with a meal. Patient not taking: Reported on 03/12/2024 01/27/24   [name], DO  diclofenac  Sodium (VOLTAREN ) 1 % GEL Apply 4 g topically 4 (four) times daily. 12/09/23   Amoako, Prince, MD  Ensure (ENSURE) Take 237 mLs by mouth 3 (three) times daily between meals.    [provider]  ferrous sulfate 325 (65 FE) MG tablet Take 325 mg by mouth every other day.    [provider]  furosemide  (LASIX ) 80 MG tablet Take 1 tablet (80 mg total) by mouth daily. 05/26/23 02/28/25  Gabino Boga, MD  hydrALAZINE  (APRESOLINE ) 25 MG tablet Take 1 tablet (25 mg total) by mouth 3 (three) times daily.  03/03/24   Zheng, Michael, DO  HYDROmorphone  (DILAUDID ) 2 MG tablet Take 1 tablet (2 mg total) by mouth 2 (two) times daily as needed for severe pain (pain score 7-10). 03/09/24 04/08/24  Gregary Sharper, MD  isosorbide  dinitrate (ISORDIL ) 20 MG tablet Take 1 tablet (20 mg total) by mouth 3 (three) times daily. 01/27/24   [name], DO  lidocaine  (LIDODERM ) 5 % Place 1 patch onto the skin daily as needed. Remove & Discard patch within 12 hours or as directed by MD 03/03/24   Zheng, Michael, DO  losartan  (COZAAR ) 50 MG tablet Take 1 tablet (50 mg total) by mouth daily. 11/11/23 11/10/24  [name], DO  multivitamin (RENA-VIT) TABS tablet Take 1 tablet by mouth daily. 01/27/24   [name], DO  pantoprazole  (PROTONIX ) 40 MG tablet Take 1 tablet (40 mg total) by mouth daily. 03/27/24   [name], DO  polyethylene glycol (MIRALAX  / GLYCOLAX ) 17 g packet Take 17 g by mouth daily.    [provider]  senna (SENOKOT) 8.6 MG TABS tablet Take 2 tablets by mouth daily as needed for mild constipation. 12/19/23   [provider]  sertraline  (ZOLOFT ) 25 MG tablet Take 1 tablet (25 mg total) by mouth daily. 02/06/24 05/06/24  [name], DO  sevelamer  carbonate (RENVELA ) 800 MG tablet Take 1 tablet (800 mg total) by mouth 3 (three) times daily with meals. 02/09/24  [name], DO  traZODone  (DESYREL ) 50 MG tablet Take 1 tablet (50 mg total) by mouth at bedtime. 03/03/24 03/03/25  Zheng, Michael, DO  triamcinolone (KENALOG) 0.025 % ointment Apply 1 Application topically daily as needed (shin). 12/13/23   [provider]  promethazine  (PHENERGAN ) 12.5 MG suppository Place 1 suppository (12.5 mg total) rectally every 8 (eight) hours as needed. Patient not taking: Reported on 11/13/2014 09/15/14 06/18/15  Palumbo, April, MD    Allergies: Diphenhydramine  hcl, Lisinopril, Metoclopramide , Nortriptyline, Tramadol, Nitroglycerin , Ibuprofen, Norvasc  [amlodipine  besylate], and Nsaids    Review of Systems   Cardiovascular:  Positive for chest pain.  Gastrointestinal:  Positive for abdominal pain, nausea and vomiting.    Updated Vital Signs BP (!) 188/99 (BP Location: Right Arm)   Pulse 69   Temp 99 F (37.2 C)   Resp 17   SpO2 96%   Physical Exam Vitals and nursing note reviewed.  Constitutional:      General: She is not in acute distress.    Appearance: She is well-developed.  HENT:     Head: Normocephalic and atraumatic.     Mouth/Throat:     Mouth: Mucous membranes are moist.   Eyes:     General: Vision grossly intact. Gaze aligned appropriately.     Extraocular Movements: Extraocular movements intact.     Conjunctiva/sclera: Conjunctivae normal.    Cardiovascular:     Rate and Rhythm: Normal rate and regular rhythm.     Pulses: Normal pulses.     Heart sounds: Normal heart sounds, S1 normal and S2 normal. No murmur heard.    No friction rub. No gallop.  Pulmonary:     Effort: Pulmonary effort is normal. No respiratory distress.     Breath sounds: Normal breath sounds.  Abdominal:     General: Bowel sounds are normal.     Palpations: Abdomen is soft.     Tenderness: There is generalized abdominal tenderness. There is no guarding or rebound.     Hernia: No hernia is present.   Musculoskeletal:        General: No swelling.     Cervical back: Full passive range of motion without pain, normal range of motion and neck supple. No spinous process tenderness or muscular tenderness. Normal range of motion.     Right lower leg: No edema.     Left lower leg: No edema.   Skin:    General: Skin is warm and dry.     Capillary Refill: Capillary refill takes less than 2 seconds.     Findings: No ecchymosis, erythema, rash or wound.   Neurological:     General: No focal deficit present.     Mental Status: She is alert and oriented to person, place, and time.     GCS: GCS eye subscore is 4. GCS verbal subscore is 5. GCS motor subscore is 6.     Cranial Nerves: Cranial nerves  2-12 are intact.     Sensory: Sensation is intact.     Motor: Motor function is intact.     Coordination: Coordination is intact.   Psychiatric:        Attention and Perception: Attention normal.        Mood and Affect: Mood normal.        Speech: Speech normal.        Behavior: Behavior normal.     (all labs ordered are listed, but only abnormal results are displayed) Labs Reviewed  BASIC METABOLIC PANEL WITH  GFR  CBC  HEPATIC FUNCTION PANEL  LIPASE, BLOOD  TROPONIN I (HIGH SENSITIVITY)    EKG: None  Radiology: No results found.  {Document cardiac monitor, telemetry assessment procedure when appropriate:32947} Procedures   Medications Ordered in the ED  HYDROmorphone  (DILAUDID ) injection 1 mg (has no administration in time range)  ondansetron  (ZOFRAN ) injection 4 mg (has no administration in time range)      {Click here for ABCD2, HEART and other calculators REFRESH Note before signing:1}                              Medical Decision Making Amount and/or Complexity of Data Reviewed Labs: ordered.  Risk Prescription drug management.   ***  {Document critical care time when appropriate  Document review of labs and clinical decision tools ie CHADS2VASC2, etc  Document your independent review of radiology images and any outside records  Document your discussion with family members, caretakers and with consultants  Document social determinants of health affecting pt's care  Document your decision making why or why not admission, treatments were needed:32947:::1}   Final diagnoses:  None    ED Discharge Orders     None

## 2024-04-01 NOTE — ED Triage Notes (Signed)
 Pt c/o generalized pain all over; NV; productive cough. T,Th, Sat dialysis pt, last treatment Saturday

## 2024-04-02 ENCOUNTER — Emergency Department (HOSPITAL_COMMUNITY)

## 2024-04-02 DIAGNOSIS — R112 Nausea with vomiting, unspecified: Secondary | ICD-10-CM | POA: Diagnosis not present

## 2024-04-02 LAB — HEPATIC FUNCTION PANEL
ALT: 78 U/L — ABNORMAL HIGH (ref 0–44)
AST: 54 U/L — ABNORMAL HIGH (ref 15–41)
Albumin: 3.2 g/dL — ABNORMAL LOW (ref 3.5–5.0)
Alkaline Phosphatase: 201 U/L — ABNORMAL HIGH (ref 38–126)
Bilirubin, Direct: 0.2 mg/dL (ref 0.0–0.2)
Indirect Bilirubin: 0.5 mg/dL (ref 0.3–0.9)
Total Bilirubin: 0.7 mg/dL (ref 0.0–1.2)
Total Protein: 7.2 g/dL (ref 6.5–8.1)

## 2024-04-02 LAB — BASIC METABOLIC PANEL WITH GFR
Anion gap: 13 (ref 5–15)
BUN: 33 mg/dL — ABNORMAL HIGH (ref 8–23)
CO2: 28 mmol/L (ref 22–32)
Calcium: 10.1 mg/dL (ref 8.9–10.3)
Chloride: 97 mmol/L — ABNORMAL LOW (ref 98–111)
Creatinine, Ser: 6.31 mg/dL — ABNORMAL HIGH (ref 0.44–1.00)
GFR, Estimated: 7 mL/min — ABNORMAL LOW (ref >60)
Glucose, Bld: 87 mg/dL (ref 70–99)
Potassium: 4.4 mmol/L (ref 3.5–5.1)
Sodium: 138 mmol/L (ref 135–145)

## 2024-04-02 LAB — RESP PANEL BY RT-PCR (RSV, FLU A&B, COVID)  RVPGX2
Influenza A by PCR: NEGATIVE
Influenza B by PCR: NEGATIVE
Resp Syncytial Virus by PCR: NEGATIVE
SARS Coronavirus 2 by RT PCR: NEGATIVE

## 2024-04-02 LAB — CBC
HCT: 32 % — ABNORMAL LOW (ref 36.0–46.0)
Hemoglobin: 10.1 g/dL — ABNORMAL LOW (ref 12.0–15.0)
MCH: 26.4 pg (ref 26.0–34.0)
MCHC: 31.6 g/dL (ref 30.0–36.0)
MCV: 83.8 fL (ref 80.0–100.0)
Platelets: 155 10*3/uL (ref 150–400)
RBC: 3.82 MIL/uL — ABNORMAL LOW (ref 3.87–5.11)
RDW: 17.5 % — ABNORMAL HIGH (ref 11.5–15.5)
WBC: 5.4 10*3/uL (ref 4.0–10.5)
nRBC: 0 % (ref 0.0–0.2)

## 2024-04-02 LAB — TROPONIN I (HIGH SENSITIVITY)
Troponin I (High Sensitivity): 116 ng/L (ref <18)
Troponin I (High Sensitivity): 116 ng/L (ref <18)

## 2024-04-02 LAB — LIPASE, BLOOD: Lipase: 71 U/L — ABNORMAL HIGH (ref 11–51)

## 2024-04-02 MED ORDER — IOHEXOL 350 MG/ML SOLN
75.0000 mL | Freq: Once | INTRAVENOUS | Status: AC | PRN
Start: 2024-04-02 — End: 2024-04-02
  Administered 2024-04-02: 75 mL via INTRAVENOUS

## 2024-04-02 MED ORDER — HYDROMORPHONE HCL 1 MG/ML IJ SOLN
2.0000 mg | Freq: Once | INTRAMUSCULAR | Status: AC
  Administered 2024-04-02: 2 mg via INTRAVENOUS
  Filled 2024-04-02: qty 2

## 2024-04-02 MED ORDER — ONDANSETRON 4 MG PO TBDP: ORAL_TABLET | ORAL | 0 refills | Status: DC

## 2024-04-02 NOTE — ED Notes (Signed)
 Pt placed on 3L [place], pt states she wears oxygen at home as needed

## 2024-04-02 NOTE — ED Notes (Signed)
 Discharge instruction and education reviewed with pt. No questions or new onset distress at this time. Pt is leaving with ptar for home.

## 2024-04-02 NOTE — ED Notes (Signed)
 Pt in X ray

## 2024-04-04 ENCOUNTER — Emergency Department (HOSPITAL_COMMUNITY)
Admission: EM | Admit: 2024-04-04 | Discharge: 2024-04-05 | Disposition: A | Discharge status: Home / Self Care | Attending: Emergency Medicine | Admitting: Emergency Medicine

## 2024-04-04 ENCOUNTER — Emergency Department (HOSPITAL_COMMUNITY)

## 2024-04-04 ENCOUNTER — Encounter (HOSPITAL_COMMUNITY): Payer: Self-pay

## 2024-04-04 ENCOUNTER — Ambulatory Visit: Payer: Self-pay

## 2024-04-04 ENCOUNTER — Other Ambulatory Visit: Payer: Self-pay

## 2024-04-04 ENCOUNTER — Encounter

## 2024-04-04 DIAGNOSIS — J45909 Unspecified asthma, uncomplicated: Secondary | ICD-10-CM | POA: Diagnosis not present

## 2024-04-04 DIAGNOSIS — I5020 Unspecified systolic (congestive) heart failure: Secondary | ICD-10-CM | POA: Diagnosis not present

## 2024-04-04 DIAGNOSIS — N186 End stage renal disease: Secondary | ICD-10-CM | POA: Insufficient documentation

## 2024-04-04 DIAGNOSIS — I132 Hypertensive heart and chronic kidney disease with heart failure and with stage 5 chronic kidney disease, or end stage renal disease: Secondary | ICD-10-CM | POA: Diagnosis not present

## 2024-04-04 DIAGNOSIS — R079 Chest pain, unspecified: Secondary | ICD-10-CM | POA: Diagnosis present

## 2024-04-04 DIAGNOSIS — Z79899 Other long term (current) drug therapy: Secondary | ICD-10-CM | POA: Diagnosis not present

## 2024-04-04 DIAGNOSIS — Z992 Dependence on renal dialysis: Secondary | ICD-10-CM | POA: Diagnosis not present

## 2024-04-04 LAB — BASIC METABOLIC PANEL WITH GFR
Anion gap: 12 (ref 5–15)
BUN: 30 mg/dL — ABNORMAL HIGH (ref 8–23)
CO2: 29 mmol/L (ref 22–32)
Calcium: 10 mg/dL (ref 8.9–10.3)
Chloride: 98 mmol/L (ref 98–111)
Creatinine, Ser: 6.4 mg/dL — ABNORMAL HIGH (ref 0.44–1.00)
GFR, Estimated: 7 mL/min — ABNORMAL LOW (ref >60)
Glucose, Bld: 78 mg/dL (ref 70–99)
Potassium: 4 mmol/L (ref 3.5–5.1)
Sodium: 139 mmol/L (ref 135–145)

## 2024-04-04 LAB — CBC
HCT: 32.7 % — ABNORMAL LOW (ref 36.0–46.0)
Hemoglobin: 10.4 g/dL — ABNORMAL LOW (ref 12.0–15.0)
MCH: 26.8 pg (ref 26.0–34.0)
MCHC: 31.8 g/dL (ref 30.0–36.0)
MCV: 84.3 fL (ref 80.0–100.0)
Platelets: 155 10*3/uL (ref 150–400)
RBC: 3.88 MIL/uL (ref 3.87–5.11)
RDW: 17.6 % — ABNORMAL HIGH (ref 11.5–15.5)
WBC: 5.7 10*3/uL (ref 4.0–10.5)
nRBC: 0 % (ref 0.0–0.2)

## 2024-04-04 LAB — TROPONIN I (HIGH SENSITIVITY)
Troponin I (High Sensitivity): 136 ng/L (ref <18)
Troponin I (High Sensitivity): 139 ng/L (ref <18)

## 2024-04-04 NOTE — ED Triage Notes (Signed)
 Pt to er, pt states I am sick pt states that she has a lot of chronic pain from her back to her legs, states that she also has some neck pain and chest pain.  Pt states that she has had the chest pain off and on for the past few hours.

## 2024-04-04 NOTE — Telephone Encounter (Signed)
 The patient has now been resch  Copied from CRM [id number]. Topic: General - Running Late >> Apr 04, 2024  2:43 PM Shamecia H wrote: Patient/patient representative is calling because they are running late for an appointment she is not sure how long she will be, I informed her about the grace period and to come on and she said okay I also let her know that it was up to the provider if they would see her or not once she arrives depending on how late she is.

## 2024-04-04 NOTE — Telephone Encounter (Signed)
 Dr Bernadine agreed to see pt.

## 2024-04-04 NOTE — Telephone Encounter (Signed)
  FYI Only or Action Required?: Action required by provider: patient is on her way to the clinic-will need guidance.  Patient was last seen in primary care on 03/09/2024 by Gregary Sharper, MD. Called Nurse Triage reporting generalized Pain. Symptoms began several weeks ago. Interventions attempted: Rest, hydration, or home remedies. Symptoms are: unchanged.  Triage Disposition: See HCP Within 4 Hours (Or PCP Triage)-patient on her way to clinic via transport.  Patient/caregiver understands and will follow disposition?: No, wishes to speak with PCP Copied from CRM [id number]. Topic: Clinical - Red Word Triage >> Apr 04, 2024  3:08 PM Brittney F wrote: [facility] that prompted transfer to Nurse Triage:   Pain in legs, side, back and stomach Reason for Disposition  [1] SEVERE pain (e.g., excruciating, unable to do any normal activities) AND [2] not improved 2 hours after pain medicine  Answer Assessment - Initial Assessment Questions 1. ONSET: When did the muscle aches or body pains start?      Chronic generalized pain 2. LOCATION: What part of your body is hurting? (e.g., entire body, arms, legs)      Legs, side, back and stomach 3. SEVERITY: How bad is the pain? (Scale 1-10; or mild, moderate, severe)   - MILD (1-3): doesn't interfere with normal activities    - MODERATE (4-7): interferes with normal activities or awakens from sleep    - SEVERE (8-10):  excruciating pain, unable to do any normal activities      Refused to give a number 4. CAUSE: What do you think is causing the pains?     Chronic pain 5. FEVER: Have you been having fever?     no 6. OTHER SYMPTOMS: Do you have any other symptoms? (e.g., chest pain, weakness, rash, cold or flu symptoms, weight loss)     Nausea and vomiting 8. TRAVEL: Have you traveled out of the country in the last month? (e.g., travel history, exposures)     No  Agent explained to nurse that patient's ride was late and patient was very upset.  Once patient transferred to nurse, patient states she is in her ride to her 3:00 PM appointment. Explained to patient that her appointment was canceled due to time, patient refused to listen and states she is heading to the office. Attempted to call clinic twice with no answer.  Protocols used: Muscle Aches and Body Pain-A-AH

## 2024-04-04 NOTE — ED Provider Triage Note (Signed)
 Emergency Medicine Provider Triage Evaluation Note  Audrey Hill , a 68 y.o. female  was evaluated in triage.  Pt complains of multiple complaints.  She states that she is complaining of chronic pain all over, neck pain which she characterizes as a throbbing sensation and diffuse chest pain.  She denies any focal weakness or numbness.6  Review of Systems  Positive:  Negative: See above   Physical Exam  BP (!) 200/101 (BP Location: Right Arm)   Pulse 72   Temp 97.8 F (36.6 C)   Resp 18   Ht 5' 6 (1.676 m)   Wt 61.7 kg   SpO2 100%   BMI 21.95 kg/m  Gen:   Awake, no distress   Resp:  Normal effort  MSK:   Moves extremities without difficulty  Other:    Medical Decision Making  Medically screening exam initiated at 5:30 PM.  Appropriate orders placed.  ISA HITZ was informed that the remainder of the evaluation will be completed by another provider, this initial triage assessment does not replace that evaluation, and the importance of remaining in the ED until their evaluation is complete.     Theotis Peers [place], [place] 04/04/24 1730

## 2024-04-04 NOTE — ED Triage Notes (Signed)
 Pt c.o chest pain and facial pain that started earlier today.

## 2024-04-05 DIAGNOSIS — R079 Chest pain, unspecified: Secondary | ICD-10-CM | POA: Diagnosis not present

## 2024-04-05 MED ORDER — OXYCODONE-ACETAMINOPHEN 5-325 MG PO TABS
1.0000 | ORAL_TABLET | Freq: Once | ORAL | Status: AC
  Administered 2024-04-05: 1 via ORAL
  Filled 2024-04-05: qty 1

## 2024-04-05 NOTE — ED Notes (Signed)
 Pansy Blackman to pickup patient in about 15 minutes.

## 2024-04-05 NOTE — ED Notes (Signed)
 Pt had large loose BM, pt changed and repositioned, RN notified.

## 2024-04-05 NOTE — ED Notes (Signed)
 Pt refusing to leave unless she had PTAR take her home, pt walks with walker at home, reports her walker is in her grandsons truck and he does not get off work until [employer]. [employer] has been taking me home.

## 2024-04-05 NOTE — Discharge Instructions (Signed)
 Please call your doctor today for further instructions on your chronic pain management please be sure to follow-up with dialysis
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
## 2024-04-05 NOTE — ED Notes (Signed)
 Took pt to the bathroom with wheelchair, pt tolerated well. Also gave pt bag lunch and drink per pt request. Pt is back in bed waiting on daughter.

## 2024-04-07 NOTE — Telephone Encounter (Signed)
 Received after hours page. Returned call to patient and confirmed DOB. Patient states that she is running out of her pain medication, Hydromorphone  HCL, states that she only has one pill left.  Per dispense history, her last date hydromorphone  was dispensed with 03/09/2024, she is due for refill tomorrow 04/08/2024.  Patient is notified that she can go to the pharmacy tomorrow and get her refill.  Patient also notes that she has an upcoming appointment on Friday at the clinic, where she wants to address her ongoing chronic pain.  Patient verbalizes understanding of plan and all questions addressed.    Audrey Hill, PGY 2

## 2024-04-09 ENCOUNTER — Telehealth: Payer: Self-pay | Admitting: Student

## 2024-04-09 ENCOUNTER — Encounter: Admitting: Student

## 2024-04-09 NOTE — Telephone Encounter (Signed)
 Copied from CRM [id number]. Topic: Clinical - Medication Refill >> Apr 09, 2024  8:20 AM Laurier C wrote: Medication: HYDROmorphone  (DILAUDID ) 2 MG tablet  Has the patient contacted their pharmacy? Yes They advised patient nothing has been sent to the pharmacy  This is the patient's preferred pharmacy:    [id number] - [place], [place] - [address] [place] [address] Phone: [phone] Fax: [phone]  Is this the correct pharmacy for this prescription? Yes If no, delete pharmacy and type the correct one.   Has the prescription been filled recently? No  Is the patient out of the medication? Yes  Has the patient been seen for an appointment in the last year OR does the patient have an upcoming appointment? Yes  Can we respond through MyChart? No  Agent: Please be advised that Rx refills may take up to 3 business days. We ask that you follow-up with your pharmacy.

## 2024-04-09 NOTE — Telephone Encounter (Signed)
 Call to patient states she is still having pain in her legs and back.  Should have been refill on the 6th of the month.

## 2024-04-10 ENCOUNTER — Ambulatory Visit: Payer: Self-pay | Admitting: Student

## 2024-04-10 ENCOUNTER — Telehealth: Payer: Self-pay | Admitting: *Deleted

## 2024-04-10 NOTE — Telephone Encounter (Signed)
 Labs reviewed by Dr Rosan, Attending. Fax to be scanned into pt's chart.

## 2024-04-10 NOTE — Telephone Encounter (Signed)
 FYI Only or Action Required?: Action required by provider: update on patient condition.  Patient was last seen in primary care on 03/09/2024 by Gregary Sharper, MD.  Called Nurse Triage reporting Back Pain.  Symptoms began several months ago.  Interventions attempted: Nothing.  Symptoms are: unchanged.  Triage Disposition: See HCP Within 4 Hours (Or PCP Triage)  Patient/caregiver understands and will follow disposition?: No, wishes to speak with PCP                               Copied from CRM [id number]. Topic: Clinical - Red Word Triage >> Apr 10, 2024  1:20 PM Audrey Hill wrote: Red Word that prompted transfer to Nurse Triage: Chronic pain Reason for Disposition  [1] SEVERE back pain (e.g., excruciating, unable to do any normal activities) AND [2] not improved 2 hours after pain medicine  Answer Assessment - Initial Assessment Questions Patient has an appointment scheduled for 7/11. Pt is wanting something to help with her pain sent to the following pharmacy:  [id number] - [place], [place] - [address] AT [facility] [address], [place] [address] Phone: [phone]  Fax: [phone]            ONSET: When did the pain begin?      Ongoing for a while; over 8 months LOCATION: Where does it hurt? (upper, mid or lower back)     Pain in back, thighs; pt went to ED last night and was told she has an infection and was started on antibiotics; pt was told her PCP needs to call in pain medications SEVERITY: How bad is the pain?  (e.g., Scale 1-10; mild, moderate, or severe)   - MILD (1-3): Doesn't interfere with normal activities.    - MODERATE (4-7): Interferes with normal activities or awakens from sleep.    - SEVERE (8-10): Excruciating pain, unable to do any normal activities.      8-9/10 pain level PATTERN: Is the pain constant? (e.g., yes, no; constant, intermittent)       Constant RADIATION: Does the pain shoot into your legs or somewhere else?     Neck down to legs MEDICINES: What have you taken so far for the pain? (e.g., nothing, acetaminophen , NSAIDS)     Nothing, Tylenol  does not work NEUROLOGIC SYMPTOMS: Do you have any weakness, numbness, or problems with bowel/bladder control?     Ongoing numbness in left arm OTHER SYMPTOMS: Do you have any other symptoms? (e.g., fever, abdomen pain, burning with urination, blood in urine)       Pt was told she has an infection in urinary tract  Protocols used: Back Pain-A-AH

## 2024-04-10 NOTE — Telephone Encounter (Signed)
 Received a fax from [facility] ED in [facility] from 04/09/24. Abnormal Urine Protein - 50; Urine RBC - 3-5; Urine bacteria - Moderate. Given to Attending.

## 2024-04-11 ENCOUNTER — Other Ambulatory Visit: Payer: Self-pay | Admitting: Student

## 2024-04-11 DIAGNOSIS — G894 Chronic pain syndrome: Secondary | ICD-10-CM

## 2024-04-11 NOTE — Telephone Encounter (Deleted)
 Copied from CRM [id number]. Topic: Clinical - Prescription Issue >> Apr 09, 2024  5:34 PM Adrianna P wrote: Reason for CRM:  HYDROmorphone  (DILAUDID ) 2 MG tablet, she is in pain and still waiting on this prescription >> Apr 11, 2024  8:48 AM Graeme ORN wrote: Patient called back this morning. Checking status of request. Was triaged yesterday and recently in hospital. Medication Not sent yet. Patient would like a call back once completed. Thank You  >> Apr 09, 2024  5:37 PM Nurse Kaye A wrote: Addressed by clinic staff this morning. Refills can take up to 3 business days.

## 2024-04-11 NOTE — Telephone Encounter (Signed)
 Copied from CRM [id number]. Topic: Clinical - Prescription Issue >> Apr 09, 2024  5:34 PM Adrianna P wrote: Reason for CRM:  HYDROmorphone  (DILAUDID ) 2 MG tablet, she is in pain and still waiting on this prescription >> Apr 11, 2024  8:48 AM Graeme ORN wrote: Patient called back this morning. Checking status of request. Was triaged yesterday and recently in hospital. Medication Not sent yet. Patient would like a call back once completed. Thank You  >> Apr 09, 2024  5:37 PM Nurse Kaye A wrote: Addressed by clinic staff this morning. Refills can take up to 3 business days.

## 2024-04-11 NOTE — Telephone Encounter (Addendum)
 Hydromorphone  has expired; not on current med list. LOV - 03/09/24.

## 2024-04-12 MED ORDER — LIDOCAINE 5 % EX PTCH: 1.0000 | MEDICATED_PATCH | Freq: Every day | CUTANEOUS | 2 refills | Status: DC | PRN

## 2024-04-12 MED ORDER — HYDROMORPHONE HCL 2 MG PO TABS: 2.0000 mg | ORAL_TABLET | Freq: Two times a day (BID) | ORAL | 0 refills | Status: DC | PRN

## 2024-04-12 NOTE — Telephone Encounter (Signed)
 Medication refill request has been addressed by Dr.Hoffman.

## 2024-04-13 ENCOUNTER — Ambulatory Visit

## 2024-04-13 VITALS — BP 155/80 | HR 73 | Temp 98.7°F | Ht 66.0 in | Wt 144.0 lb

## 2024-04-13 DIAGNOSIS — I10 Essential (primary) hypertension: Secondary | ICD-10-CM

## 2024-04-13 DIAGNOSIS — I110 Hypertensive heart disease with heart failure: Secondary | ICD-10-CM | POA: Diagnosis not present

## 2024-04-13 DIAGNOSIS — I5020 Unspecified systolic (congestive) heart failure: Secondary | ICD-10-CM

## 2024-04-13 DIAGNOSIS — M791 Myalgia, unspecified site: Secondary | ICD-10-CM | POA: Diagnosis not present

## 2024-04-13 DIAGNOSIS — G4700 Insomnia, unspecified: Secondary | ICD-10-CM

## 2024-04-13 NOTE — Assessment & Plan Note (Signed)
 The patient is compliant with her medications and appears euvolemic, with no new episodes of shortness of breath. On physical examination, her lungs were clear to auscultation bilaterally. Cardiac exam revealed a murmur, unchanged from prior evaluations. There is no peripheral swelling noted.  Review of systems is otherwise negative for chest pain, palpitations, dizziness, cough, fever, or recent weight changes. She denies any new headaches, or gastrointestinal symptoms.  The patient will continue her current medication regimen with close monitoring.

## 2024-04-13 NOTE — Assessment & Plan Note (Addendum)
 Blood Pressure: 155/80 mmHg  The patient is compliant with her medications and denies chest pain, headache, or shortness of breath. We will continue managing her blood pressure with the current regimen.

## 2024-04-13 NOTE — Assessment & Plan Note (Signed)
 The patient presented with chronic, generalized body pain, with a particular emphasis on the left side of her neck. She did not report any additional neurological symptoms such as numbness or weakness. On physical examination, the muscles on the left side of her neck were notably hard, stiff, and tender to palpation, while there was no tenderness over the cervical vertebrae.  Assessment: Muscular spasm of the left neck muscles.  Given that the patient is currently on multiple medications, including trazodone , sertraline , and hydromorphone , we decided not to initiate any new pharmacologic treatments at this time. Instead, we have referred her for physical therapy to address the muscular spasm and improve mobility and comfort.
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
## 2024-04-13 NOTE — Patient Instructions (Addendum)
 Dear Ms. Heron RAMAN. Charlott,  Thank you for allowing us  to provide your care today. During the visit, we discussed your body and neck muscle pain. Since your muscles were very stiff and hard, we diagnosed a muscular spasm. We have started physical therapy for you, and you should continue using heat packs as well. We discussed your medications, and you are compliant with the regimen. There is no foot swelling, and you have not experienced any new episodes of chest pain or headache.  Please feel free to reach out if you have any questions or concerns.  I have ordered the following labs for you:  Lab Orders  No laboratory test(s) ordered today     Tests ordered today:   Referrals ordered today:   Referral Orders  No referral(s) requested today     I have ordered the following medication/changed the following medications:   Stop the following medications: There are no discontinued medications.   Start the following medications: No orders of the defined types were placed in this encounter.    Follow up: 6 months   Remember:   Should you have any questions or concerns please call the internal medicine clinic at [phone].   Armando Rossetti, M.D [facility]

## 2024-04-16 ENCOUNTER — Telehealth: Payer: Self-pay

## 2024-04-18 ENCOUNTER — Telehealth: Payer: Self-pay | Admitting: Pharmacist

## 2024-04-18 NOTE — Progress Notes (Signed)
   04/18/2024  Patient ID: Audrey Hill, female   DOB: 1956-03-02, 68 y.o.   MRN: [id number]  Called and spoke with the patient on the phone today for ED follow-up. Has gone to the ED a few occasions for pain-related concerns. Had UTI at last visit. Reports finishing up the antibiotics with no concerns and had no symptoms prior to antibiotic initiation.  Medication fills are all up-to-date. Main concern continues to be pain. Has an injection scheduled for 8/11 and PT for 7/30, to help with neck muscle pain. Already on a high number of serotonin-related medications and high dose of hydromorphone  monthly.   Three types of injections listed in Care Everywhere: Korsuva (itching w/ dialysis), Hectorol  (Vitamin D2 analog), Mircera (ESA). Confirmed getting these.  Physical therapy was there during her call. Said their main recommendation was to continue moving despite severity of arthritis to help her condition. Advised that Tylenol  later in the day may help a little more and Voltaren  gel 4x per day (rather than twice a day prior), but patient said this was ineffective.  For dialysis concerns, said they encouraged her to work on eating more and to improve appetite. Cannot afford the Ensure drinks, per patient.   No immediate concerns to address at this time.     Aloysius Lewis, PharmD [facility]  Phone Number: [phone]

## 2024-04-19 NOTE — Progress Notes (Signed)
 Internal Medicine Clinic Attending  I was physically present during the key portions of the resident provided service and participated in the medical decision making of patient's management care. I reviewed pertinent patient test results.  The assessment, diagnosis, and plan were formulated together and I agree with the documentation in the resident's note.  Carney Living, MD

## 2024-04-25 ENCOUNTER — Telehealth: Payer: Self-pay | Admitting: *Deleted

## 2024-04-25 NOTE — Telephone Encounter (Signed)
 Mammogram appointment September 2.2025 @ 10:00 am to arrive 9:45 am / appointment mailed to the patient with the information regarding the $75.00 no show fee.

## 2024-04-25 NOTE — Telephone Encounter (Signed)
 Call from Lisa,RN  from [facility] is with patient today to help her with her medications.  Patient gets Pill packs. Question is the Carvedilol  still on hold.  Spoke with Dr. Karna. Patient is to hold the Carvedilol  for now.  Will readdress at next visit in 3 months. Information was given to Olam, RN to hold the Carvedilol  for now -will be addressed at next visit in 3 months.  Call to [facility] to hold the Carvedilol  for the patient.

## 2024-04-25 NOTE — Telephone Encounter (Signed)
 RTC to [employer] from [facility] Needed Clarification on the dosing of the Hydralazine  25 mg.- Copied from CRM [id number]. Topic: Clinical - Prescription Issue >> Apr 25, 2024  9:33 AM Laurier BROCKS wrote: Reason for CRM: Patient's home health nurse Giles [phone]) called in stating [facility] is needing clarification on the following medication:  hydrALAZINE  (APRESOLINE ) 25 MG tablet called in advising the following medication: hydrALAZINE  (APRESOLINE ) 25 MG tablet The patient is currently taking medication as followed: 1 tablet 3 x's a day but the bottle says take 2 tablets 3 x's a day

## 2024-04-27 ENCOUNTER — Telehealth: Payer: Self-pay | Admitting: *Deleted

## 2024-04-27 DIAGNOSIS — R0609 Other forms of dyspnea: Secondary | ICD-10-CM

## 2024-04-27 DIAGNOSIS — I5023 Acute on chronic systolic (congestive) heart failure: Secondary | ICD-10-CM

## 2024-04-27 NOTE — Telephone Encounter (Signed)
 Will forward to C. Boone  Copied from [employer] [id number]. Topic: Clinical - Order For Equipment >> Apr 27, 2024 12:22 PM Fredrica W wrote: Reason for CRM: Firman called from [facility] home health. Checking status of requested signature. States signature needed for shower chair. Requested over a month ago. Would like a call back ASAP. [phone] ext [phone]. Thank You

## 2024-04-30 ENCOUNTER — Telehealth: Payer: Self-pay | Admitting: *Deleted

## 2024-04-30 ENCOUNTER — Other Ambulatory Visit: Payer: Self-pay | Admitting: *Deleted

## 2024-04-30 NOTE — Telephone Encounter (Signed)
 RTC to Audrey Hill from [facility] message was left that patient has had 2 recent visits and no mention was made for need of the Shower Char .  Will forward to PCP to see if an order can be done.

## 2024-04-30 NOTE — Telephone Encounter (Signed)
 Will forward to [employer] to follow up. Copied from CRM [id number]. Topic: Clinical - Order For Equipment >> Apr 27, 2024  2:08 PM Suzette B wrote: Reason for CRM: Received a call from Ms. Aldean of [facility] [phone], Ms. Aldean states that she'd called earlier to check the status of a shower chair that was supposed to be ordered for the patient a month ago. She states she needs to have an update on the status of that order submitted by provider for the shower chair, Would like a call back ASAP. [phone] ext [phone].

## 2024-04-30 NOTE — Patient Instructions (Signed)
 Visit Information  Thank you for taking time to visit with me today. Please don't hesitate to contact me if I can be of assistance to you before our next scheduled appointment.  Our next appointment is by telephone on 05/14/2024 at 10:00 am Please call the care guide team at [phone] if you need to cancel or reschedule your appointment.   Following is a copy of your care plan:   Goals Addressed             This Visit's Progress    VBCI RN Care Plan-HTN       Problems:  Chronic Disease Management support and education needs related to HTN  Goal: Over the next 90 days the Patient will take all medications exactly as prescribed and will call provider for medication related questions as evidenced by chart review and patient's report    verbalize understanding of plan for management of HTN as evidenced by improved readings reported by patient from the dialysis center and chart review  Interventions:   Hypertension Interventions: Last practice recorded BP readings:  BP Readings from Last 3 Encounters:  04/13/24 (!) 155/80  04/05/24 (!) 176/99  04/02/24 (!) 158/64   Most recent eGFR/CrCl:  Lab Results  Component Value Date   EGFR 24 (L) 08/18/2021    No components found for: CRCL  Evaluation of current treatment plan related to hypertension self management and patient's adherence to plan as established by provider Provided education to patient re: stroke prevention, s/s of heart attack and stroke Reviewed medications with patient and discussed importance of compliance with taking all prescribed medications Discussed plans with patient for ongoing care management follow up and provided patient with direct contact information for care management team Advised patient, providing education and rationale, to monitor blood pressure daily and record, calling PCP for findings outside established parameters Advised patient to discuss her ongoing nasal drainage with provider for  possible OTC medicine to assist Discussed complications of poorly controlled blood pressure such as heart disease, stroke, circulatory complications, vision complications, kidney impairment, sexual dysfunction Screening for signs and symptoms of depression related to chronic disease state  Assessed social determinant of health barriers  Patient Self-Care Activities:  Attend all scheduled provider appointments Call pharmacy for medication refills 3-7 days in advance of running out of medications Call provider office for new concerns or questions  Perform all self care activities independently  Perform IADL's (shopping, preparing meals, housekeeping, managing finances) independently Take medications as prescribed  Encouraged patient to write blood pressure results in a log when taken at the dialysis center for provider to view learn about high blood pressure keep a blood pressure log take blood pressure log to all doctor appointments call doctor for signs and symptoms of high blood pressure keep all doctor appointments take medications for blood pressure exactly as prescribed report new symptoms to your doctor eat more whole grains, fruits and vegetables, lean meats and healthy fats limit salt intake    Plan:  Telephone follow up appointment with care management team member scheduled for:  05/14/2024 @ 1000             Please call the Suicide and Crisis Lifeline: 988 call the [place]  National Suicide Prevention Lifeline: 832-268-1073 or TTY: (780) 035-2350 TTY 315-792-3186) to talk to a trained counselor call 1-800-273-TALK (toll free, 24 hour hotline) if you are experiencing a Mental Health or Behavioral Health Crisis or need someone to talk to.  The patient verbalized understanding of instructions, educational materials,  and care plan provided today and agreed to receive a mailed copy of patient instructions, educational materials, and care plan.    Olam Ku, RN, BSN [facility]  [facility], [facility] RN Care Manager Direct Dial : [phone]  Fax: [phone]
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
## 2024-05-01 ENCOUNTER — Telehealth: Payer: Self-pay | Admitting: *Deleted

## 2024-05-01 NOTE — Telephone Encounter (Signed)
 Call to [facility] message left that a Shower Chair has been ordered by the doctor.

## 2024-05-01 NOTE — Telephone Encounter (Signed)
 Will forward to Dr. Tobie.  Copied from CRM [id number]. Topic: Clinical - Medication Question >> May 01, 2024  1:33 PM Marda MATSU wrote: Patient Cushman called and would like to ask a question regarding her medication:  carvedilol  (COREG ) 3.125 MG tablet . Am I still suppose to be taking this mediation?   Please advise  .

## 2024-05-02 ENCOUNTER — Ambulatory Visit: Attending: Internal Medicine | Admitting: Rehabilitation

## 2024-05-02 ENCOUNTER — Telehealth: Payer: Self-pay | Admitting: *Deleted

## 2024-05-02 ENCOUNTER — Ambulatory Visit

## 2024-05-02 DIAGNOSIS — G894 Chronic pain syndrome: Secondary | ICD-10-CM | POA: Diagnosis not present

## 2024-05-02 DIAGNOSIS — I5020 Unspecified systolic (congestive) heart failure: Secondary | ICD-10-CM

## 2024-05-02 MED ORDER — HYDROMORPHONE HCL 2 MG PO TABS
2.0000 mg | ORAL_TABLET | Freq: Two times a day (BID) | ORAL | 0 refills | Status: DC | PRN
Start: 2024-07-11 — End: 2024-08-11

## 2024-05-02 MED ORDER — HYDROMORPHONE HCL 2 MG PO TABS: 2.0000 mg | ORAL_TABLET | Freq: Two times a day (BID) | ORAL | 0 refills | Status: DC | PRN
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
## 2024-05-02 NOTE — Progress Notes (Signed)
 CC: Telephone visit, established follow up.  HPI:  Audrey Hill is a 68 y.o. female with pertinent PMH of chronic back and neck pain, HFrEF, arthritis, anxiety, HTN, HLD, ESRD on dialysis who presents for back and neck pain, along with discussion of a shower chair. This is a telephone encounter between Heron GORMAN Sauers and [employer] on 05/02/2024. The visit was conducted with the patient located at home and Materials engineer at [facility]. The patient's identity was confirmed using their DOB and current address. The patient has consented to being evaluated through a telephone encounter and understands the associated risks (an examination cannot be done and the patient may need to come in for an appointment) / benefits (allows the patient to remain at home).  There are audio/video capabilities available in our office however the patient is unable to use an audio video service.  Thusly, the visit was conducted over the telephone. I personally spent 15 minutes on medical discussion.  She has lower back and neck pain.  At her last visit in early July she was diagnosed with muscular spasm but at that time no muscle relaxants were initiated due to concerns for polypharmacy.  She has not been to physical therapy since that visit.  She states that her pain in her lower back and neck is relatively unchanged since that time radiating down her bilateral legs and keeping her from sleep.  She rates her pain as a 9 out of 10.  Her pain is exacerbated with movement.  She denies saddle anesthesia and bowel or bladder incontinence.  In addition to this,  she has not received a shower chair yet.  She has difficulty with ADLs and is unable to stand for a long period of time in the shower due to her shortness of breath with small amounts of exertion.  She would likely benefit from a shower chair, and this has been recommended by occupational therapy on 03/02/24.  Medications: Current Outpatient Medications   Medication Instructions   albuterol  (VENTOLIN  HFA) 108 (90 Base) MCG/ACT inhaler INHALE 2 PUFFS BY MOUTH EVERY 6 HOURS AS NEEDED FOR COUGH   amiodarone  (PACERONE ) 200 mg, Oral, Daily   atorvastatin  (LIPITOR) 40 mg, Oral, Daily   Calcium  Acetate 667 mg, 5 times daily   [Paused] carvedilol  (COREG ) 3.125 mg, Oral, 2 times daily with meals   diclofenac  Sodium (VOLTAREN ) 4 g, Topical, 4 times daily   Difelikefalin Acetate (KORSUVA) 65 MCG/1.3ML SOLN 0.6 mLs   Doxercalciferol  (HECTOROL  IV) 3 mcg   Ensure (ENSURE) 237 mLs, 3 times daily between meals   ferrous sulfate 325 mg, Every other day   furosemide  (LASIX ) 80 mg, Oral, Daily   hydrALAZINE  (APRESOLINE ) 25 mg, Oral, 3 times daily   [START ON 05/12/2024] HYDROmorphone  (DILAUDID ) 2 mg, Oral, Every 12 hours PRN   [START ON 06/11/2024] HYDROmorphone  (DILAUDID ) 2 mg, Oral, Every 12 hours PRN   [START ON 07/11/2024] HYDROmorphone  (DILAUDID ) 2 mg, Oral, Every 12 hours PRN   isosorbide  dinitrate (ISORDIL ) 20 mg, Oral, 3 times daily   lidocaine  (LIDODERM ) 5 % 1 patch, Transdermal, Daily PRN, Remove & Discard patch within 12 hours or as directed by MD   losartan  (COZAAR ) 50 mg, Oral, Daily   Methoxy PEG-Epoetin Beta (MIRCERA IJ) 100 mcg   multivitamin (RENA-VIT) TABS tablet 1 tablet, Oral, Daily   ondansetron  (ZOFRAN -ODT) 4 MG disintegrating tablet 4mg  ODT q4 hours prn nausea/vomit   pantoprazole  (PROTONIX ) 40 mg, Oral, Daily   polyethylene glycol (MIRALAX  /  GLYCOLAX ) 17 g, Daily   senna (SENOKOT) 8.6 MG TABS tablet 2 tablets, Daily PRN   sertraline  (ZOLOFT ) 25 mg, Oral, Daily   sevelamer  carbonate (RENVELA ) 800 mg, Oral, 3 times daily with meals   traZODone  (DESYREL ) 50 mg, Oral, Daily at bedtime   triamcinolone (KENALOG) 0.025 % ointment 1 Application, Daily PRN     Physical Exam:  There were no vitals filed for this visit.  Physical Exam No physical exam as this was a telehealth visit.    Assessment & Plan:   Assessment & Plan HFrEF  (heart failure with reduced ejection fraction) (HCC) This patient has dyspnea on exertion and is unable to stand for long periods of time due to her multiple medical comorbidities.  While she was hospitalized in May and occupational therapist recommended that she receive a shower chair/bedside commode 3 and 1.  She has not received this yet.  I believe that she would benefit from this as she is likely unable to stand for the entirety of the shower without becoming dyspneic.  She normally ambulates with a walker, and it would be unsafe for her to stand in the shower for long periods of time.  -Order for shower chair placed yesterday, agree with this Chronic pain syndrome She has chronic pain in her back and neck.  She was seen for an exacerbation of this pain in early July, and found to have spasms of her neck.  At that time she did received muscle relaxers because of concerns for polypharmacy.  She is requesting that her Dilaudid  be refilled at this visit.  PDMP reviewed with only our office prescribing opioids.  She is due for refill on August 10 and was provided with a 39-month supply starting on August 10.  At her previous office visit it was recommended that she begin physical therapy, and a referral was placed. However, she has not been contacted yet regarding scheduling this.  -3x30 days of dilaudid  2mg  BID -enquire about PT referral with our office  No orders of the defined types were placed in this encounter.    Patient seen with Dr. Dayton Rosan Melvenia Napoleon, MD [facility] Internal Medicine Resident PGY-1 Clinic Phone: [phone] Please contact the on call pager at [phone] for any urgent or emergent needs.

## 2024-05-02 NOTE — Telephone Encounter (Signed)
 Received a call from [facility] with [facility]. Stated she's currently in the home- pt c/o headache, sinus congestion, and cough which started last night. Pt is requesting medication. Afebrile @ 99.1. BP - 178/99, pt took her medication;1/2 hour later BP- 158/78. Send rx to [facility] on [address]. Thanks

## 2024-05-02 NOTE — Telephone Encounter (Signed)
 RTC to patient- informed patient per Dr. Tobie to not take the Coreg  as it made her blood pressure drop.  Patient voiced understanding and will not take the Coreg .

## 2024-05-02 NOTE — Assessment & Plan Note (Signed)
 This patient has dyspnea on exertion and is unable to stand for long periods of time due to her multiple medical comorbidities.  While she was hospitalized in May and occupational therapist recommended that she receive a shower chair/bedside commode 3 and 1.  She has not received this yet.  I believe that she would benefit from this as she is likely unable to stand for the entirety of the shower without becoming dyspneic.  She normally ambulates with a walker, and it would be unsafe for her to stand in the shower for long periods of time.  -Order for shower chair placed yesterday, agree with this

## 2024-05-02 NOTE — Patient Instructions (Signed)
 Thank you, [name], for allowing us  to provide your care today. Today we discussed . . .  > Shower chair       - We ordered a shower chair today so that you do not have to stand for long periods of time in the shower. > Chronic back pain       - We refilled your Dilaudid  2 mg twice a day.  We are also working on getting you into physical therapy and someone will call you about that.   Follow up: 1 month    Remember:  Should you have any questions or concerns please call the internal medicine clinic at [phone].     Melvenia Morrison, [facility]

## 2024-05-02 NOTE — Assessment & Plan Note (Signed)
 She has chronic pain in her back and neck.  She was seen for an exacerbation of this pain in early July, and found to have spasms of her neck.  At that time she did received muscle relaxers because of concerns for polypharmacy.  She is requesting that her Dilaudid  be refilled at this visit.  PDMP reviewed with only our office prescribing opioids.  She is due for refill on August 10 and was provided with a 77-month supply starting on August 10.  At her previous office visit it was recommended that she begin physical therapy, and a referral was placed. However, she has not been contacted yet regarding scheduling this.  -3x30 days of dilaudid  2mg  BID -enquire about PT referral with our office

## 2024-05-02 NOTE — Telephone Encounter (Addendum)
 Pt was called and informed of Dr Anthony response to schedule an appt. Call transferred to front office - Appt schedule Friday 8/1 @ 1015Am with Dr Napoleon.

## 2024-05-02 NOTE — Progress Notes (Signed)
Internal Medicine Clinic Attending  I was physically present during the key portions of the resident provided service and participated in the medical decision making of patient's management care. I reviewed pertinent patient test results.  The assessment, diagnosis, and plan were formulated together and I agree with the documentation in the resident's note.  Gust Rung, DO

## 2024-05-02 NOTE — Addendum Note (Signed)
 Addended by: ROSAN DAYTON BROCKS on: 05/02/2024 04:34 PM   Modules accepted: Level of Service

## 2024-05-04 ENCOUNTER — Ambulatory Visit

## 2024-05-04 VITALS — BP 145/70 | HR 67 | Temp 98.8°F | Ht 66.0 in | Wt 136.2 lb

## 2024-05-04 DIAGNOSIS — R519 Headache, unspecified: Secondary | ICD-10-CM | POA: Diagnosis not present

## 2024-05-04 DIAGNOSIS — J069 Acute upper respiratory infection, unspecified: Secondary | ICD-10-CM | POA: Diagnosis not present

## 2024-05-04 DIAGNOSIS — G4700 Insomnia, unspecified: Secondary | ICD-10-CM

## 2024-05-04 MED ORDER — KETOROLAC TROMETHAMINE 30 MG/ML IJ SOLN
30.0000 mg | Freq: Once | INTRAMUSCULAR | Status: AC
  Administered 2024-05-04: 30 mg via INTRAMUSCULAR

## 2024-05-04 MED ORDER — TRAZODONE HCL 50 MG PO TABS: ORAL_TABLET | ORAL | 0 refills | Status: DC

## 2024-05-04 NOTE — Patient Instructions (Signed)
 Thank you, Ferd GORMAN Sauers, for allowing us  to provide your care today. Today we discussed . . .  > Headache and cough       - The cough and headache that you currently have are probably from a viral infection which will get better with time.  Until it gets better, you can do some things for symptomatic relief.  We have given you a Toradol  shot in the clinic today to help with your pain.  You can also use your asthma inhaler to see if it helps with your cough.  You may try and take Tylenol  if you would like. > Trouble sleeping       -  I have also increased the dose of your trazodone  to 100 mg at night.  Since you already have the pill packs with 50 mg, you should take the 50 mg of trazodone  in your pill pack and add another 50 mg from the prescription that I am sending to [facility].    Follow up: 6 months    Remember:  Should you have any questions or concerns please call the internal medicine clinic at [phone].     Melvenia Morrison, [facility]

## 2024-05-04 NOTE — Addendum Note (Signed)
 Addended by: ROSAN PRIDE C on: 05/04/2024 03:18 PM   Modules accepted: Level of Service

## 2024-05-04 NOTE — Progress Notes (Signed)
 CC: Acute Concern of headache and cough  HPI:  Audrey Hill is a 68 y.o. female with pertinent PMH of chronic back and neck pain, HFrEF, arthritis, anxiety, HTN, HLD, ESRD on dialysis TTS who presents for headache, cough and general malaise.   About 4 days ago she woke up with a headache and runny nose.  These have waxed and waned over time but have remained relatively constant since then.  She rates her pain in her head as a 9 out of 10.  She states that she cannot take Tylenol  or ibuprofen because they upset her stomach.  She also has had a runny nose and some left ear pain as well.  She has had a cough that is nonproductive and is worse when she starts eating or drinking, however she coughs at other times as well.  She has tried some cough syrup and some benzonatate  without relief.  She has felt feverish and had measured her temperature at 99.1.  She denies sick contacts.  She has had trouble sleeping at night and does not feel like she can keep anything down.  She has been vomiting about once a day.  She states this does not feel like an asthma flare for her.  She went to the ED on Wednesday and received a shot of Toradol  and stated that she was feeling much better for about a half a day until she went to dialysis and then started feeling poorly again.  In the ED her labs are consistent with some of the ESRD but were otherwise unrevealing.   HPI  Review of Systems  Constitutional:  Positive for chills and malaise/fatigue.  HENT:  Positive for congestion, ear pain and sore throat. Negative for hearing loss.   Respiratory:  Positive for cough and shortness of breath. Negative for sputum production.   Cardiovascular:  Negative for chest pain.  Gastrointestinal:  Positive for diarrhea, nausea and vomiting. Negative for abdominal pain, blood in stool, constipation and heartburn.  Genitourinary:  Negative for dysuria.  Neurological:  Positive for dizziness and headaches.     Medications: Current Outpatient Medications  Medication Instructions   albuterol  (VENTOLIN  HFA) 108 (90 Base) MCG/ACT inhaler INHALE 2 PUFFS BY MOUTH EVERY 6 HOURS AS NEEDED FOR COUGH   amiodarone  (PACERONE ) 200 mg, Oral, Daily   atorvastatin  (LIPITOR) 40 mg, Oral, Daily   Calcium  Acetate 667 mg, 5 times daily   [Paused] carvedilol  (COREG ) 3.125 mg, Oral, 2 times daily with meals   diclofenac  Sodium (VOLTAREN ) 4 g, Topical, 4 times daily   Difelikefalin Acetate (KORSUVA) 65 MCG/1.3ML SOLN 0.6 mLs   Doxercalciferol  (HECTOROL  IV) 3 mcg   Ensure (ENSURE) 237 mLs, 3 times daily between meals   ferrous sulfate 325 mg, Every other day   furosemide  (LASIX ) 80 mg, Oral, Daily   hydrALAZINE  (APRESOLINE ) 25 mg, Oral, 3 times daily   [START ON 05/12/2024] HYDROmorphone  (DILAUDID ) 2 mg, Oral, Every 12 hours PRN   [START ON 06/11/2024] HYDROmorphone  (DILAUDID ) 2 mg, Oral, Every 12 hours PRN   [START ON 07/11/2024] HYDROmorphone  (DILAUDID ) 2 mg, Oral, Every 12 hours PRN   isosorbide  dinitrate (ISORDIL ) 20 mg, Oral, 3 times daily   lidocaine  (LIDODERM ) 5 % 1 patch, Transdermal, Daily PRN, Remove & Discard patch within 12 hours or as directed by MD   losartan  (COZAAR ) 50 mg, Oral, Daily   Methoxy PEG-Epoetin Beta (MIRCERA IJ) 100 mcg   multivitamin (RENA-VIT) TABS tablet 1 tablet, Oral, Daily   ondansetron  (ZOFRAN -ODT)  4 MG disintegrating tablet 4mg  ODT q4 hours prn nausea/vomit   pantoprazole  (PROTONIX ) 40 mg, Oral, Daily   polyethylene glycol (MIRALAX  / GLYCOLAX ) 17 g, Daily   senna (SENOKOT) 8.6 MG TABS tablet 2 tablets, Daily PRN   sertraline  (ZOLOFT ) 25 mg, Oral, Daily   sevelamer  carbonate (RENVELA ) 800 mg, Oral, 3 times daily with meals   traZODone  (DESYREL ) 50 MG tablet Take 1 tablet of trazodone  50mg  in addition to the 50mg  in your pill pack currently for a total of 100mg  (2 tablets)   traZODone  (DESYREL ) 50 mg, Oral, Daily at bedtime   triamcinolone (KENALOG) 0.025 % ointment 1 Application,  Daily PRN     Physical Exam:  Vitals:   05/04/24 1009 05/04/24 1015  BP: (!) 181/87 (!) 145/70  Pulse: 72 67  Temp: 98.8 F (37.1 C)   TempSrc: Oral   SpO2: 90%   Weight: 136 lb 3.2 oz (61.8 kg)   Height: 5' 6 (1.676 m)     Physical Exam Constitutional:      Appearance: She is ill-appearing.  HENT:     Head:     Comments: Tender over frontal and maxillary sinuses    Right Ear: Ear canal normal.     Left Ear: Ear canal normal.     Ears:     Comments: Tympanic membrane pearly without signs of infection    Nose: Congestion and rhinorrhea present.     Mouth/Throat:     Mouth: Mucous membranes are moist.     Pharynx: No oropharyngeal exudate or posterior oropharyngeal erythema.  Eyes:     Extraocular Movements: Extraocular movements intact.     Pupils: Pupils are equal, round, and reactive to light.  Cardiovascular:     Rate and Rhythm: Normal rate and regular rhythm.  Pulmonary:     Effort: Pulmonary effort is normal.     Breath sounds: Normal breath sounds.  Abdominal:     Tenderness: There is no abdominal tenderness.  Musculoskeletal:     Cervical back: Normal range of motion. No rigidity.     Right lower leg: No edema.     Left lower leg: No edema.  Skin:    General: Skin is warm and dry.  Neurological:     Mental Status: She is alert.     Cranial Nerves: No cranial nerve deficit.     Sensory: No sensory deficit.     Motor: No weakness.     WBC 6.37 on 7/30 from outside records  CMP on 7/30 with alkalosis, elevated BUN and Cr and elevated ALP Assessment & Plan:   Assessment & Plan Sinus headache She has had a headache for about 4 days in conjunction with viral URI symptoms.  No red flag symptoms, no neurological deficits.  She received Toradol  in the ED for this with some benefit.  Her symptoms should continue to resolve on their own.  Will treat her supportively for now with another Toradol  shot.  She may take Tylenol  if she would like, although this has  upset her stomach in the past.  Noted that she was able to tolerate Percocet, so she should be able to tolerate Tylenol .    - Toradol  30mg  shot today. Viral URI with cough Afebrile with URI symptoms.  No leukocytosis. No electrolyte abnormalities. For her cough, we recommended that she continue to use her inhaler as needed.  She may continue to take the benzonatate  pearls and the cough syrup although she has not found these to  be particularly useful.  Her symptoms should resolve with time. Insomnia, unspecified type Her symptoms have prevented her from sleeping well.  In addition to this she has not been sleeping well for several months.  I think it is reasonable to increase her trazodone  dose to 100 mg per night.  She already is prescribed 50 mg per night and they are already prepackaged in pill packs and she would like to keep taking these.  With that in mind I have prescribed her 50 mg nightly of Trazodone  that she should take in addition to the 50 mg she already has for a total of 100 mg per night.  - Trazodone  100 mg per night, prescribed 50 mg that she can take in addition to her already prescribed 50mg .   Patient seen with Dr. Dayton Eastern  MDM: Reviewed ED note from 7/30, review of CBC and CMP, prescription drug management = moderate complexity.  Melvenia Morrison, MD [facility] Center Internal Medicine Resident PGY-1 Clinic Phone: [phone] Please contact the on call pager at [phone] for any urgent or emergent needs.

## 2024-05-04 NOTE — Assessment & Plan Note (Signed)
 Her symptoms have prevented her from sleeping well.  In addition to this she has not been sleeping well for several months.  I think it is reasonable to increase her trazodone  dose to 100 mg per night.  She already is prescribed 50 mg per night and they are already prepackaged in pill packs and she would like to keep taking these.  With that in mind I have prescribed her 50 mg nightly of Trazodone  that she should take in addition to the 50 mg she already has for a total of 100 mg per night.  - Trazodone  100 mg per night, prescribed 50 mg that she can take in addition to her already prescribed 50mg .

## 2024-05-04 NOTE — Progress Notes (Signed)
Internal Medicine Clinic Attending  I was physically present during the key portions of the resident provided service and participated in the medical decision making of patient's management care. I reviewed pertinent patient test results.  The assessment, diagnosis, and plan were formulated together and I agree with the documentation in the resident's note.  Gust Rung, DO

## 2024-05-05 DIAGNOSIS — E1122 Type 2 diabetes mellitus with diabetic chronic kidney disease: Secondary | ICD-10-CM | POA: Diagnosis not present

## 2024-05-05 DIAGNOSIS — L299 Pruritus, unspecified: Secondary | ICD-10-CM | POA: Diagnosis not present

## 2024-05-05 DIAGNOSIS — R1110 Vomiting, unspecified: Secondary | ICD-10-CM | POA: Diagnosis not present

## 2024-05-05 DIAGNOSIS — Z992 Dependence on renal dialysis: Secondary | ICD-10-CM | POA: Diagnosis not present

## 2024-05-05 DIAGNOSIS — D631 Anemia in chronic kidney disease: Secondary | ICD-10-CM | POA: Diagnosis not present

## 2024-05-05 DIAGNOSIS — N2581 Secondary hyperparathyroidism of renal origin: Secondary | ICD-10-CM | POA: Diagnosis not present

## 2024-05-05 DIAGNOSIS — J9612 Chronic respiratory failure with hypercapnia: Secondary | ICD-10-CM | POA: Diagnosis not present

## 2024-05-05 DIAGNOSIS — N186 End stage renal disease: Secondary | ICD-10-CM | POA: Diagnosis not present

## 2024-05-05 DIAGNOSIS — D509 Iron deficiency anemia, unspecified: Secondary | ICD-10-CM | POA: Diagnosis not present

## 2024-05-05 DIAGNOSIS — R110 Nausea: Secondary | ICD-10-CM | POA: Diagnosis not present

## 2024-05-07 ENCOUNTER — Telehealth: Payer: Self-pay | Admitting: *Deleted

## 2024-05-07 NOTE — Telephone Encounter (Unsigned)
 Copied from CRM [id number]. Topic: Clinical - Order For Equipment >> Apr 27, 2024  2:08 PM Suzette B wrote: Reason for CRM: Received a call from Ms. Aldean of [facility] [phone], Ms. Aldean states that she'd called earlier to check the status of a shower chair that was supposed to be ordered for the patient a month ago. She states she needs to have an update on the status of that order submitted by provider for the shower chair, Would like a call back ASAP. [phone] ext [phone]. >> May 04, 2024  4:54 PM Graeme ORN wrote: Aldean called back from [facility]. She needs someone to return her call ASAP. She needs an on request. Thank You

## 2024-05-09 DIAGNOSIS — N186 End stage renal disease: Secondary | ICD-10-CM | POA: Diagnosis not present

## 2024-05-09 DIAGNOSIS — I5023 Acute on chronic systolic (congestive) heart failure: Secondary | ICD-10-CM | POA: Diagnosis not present

## 2024-05-09 DIAGNOSIS — D631 Anemia in chronic kidney disease: Secondary | ICD-10-CM | POA: Diagnosis not present

## 2024-05-09 DIAGNOSIS — I132 Hypertensive heart and chronic kidney disease with heart failure and with stage 5 chronic kidney disease, or end stage renal disease: Secondary | ICD-10-CM | POA: Diagnosis not present

## 2024-05-10 ENCOUNTER — Other Ambulatory Visit: Payer: Self-pay | Admitting: Student

## 2024-05-10 DIAGNOSIS — J9621 Acute and chronic respiratory failure with hypoxia: Secondary | ICD-10-CM | POA: Diagnosis not present

## 2024-05-10 DIAGNOSIS — I5023 Acute on chronic systolic (congestive) heart failure: Secondary | ICD-10-CM | POA: Diagnosis not present

## 2024-05-10 DIAGNOSIS — D631 Anemia in chronic kidney disease: Secondary | ICD-10-CM | POA: Diagnosis not present

## 2024-05-10 DIAGNOSIS — K31819 Angiodysplasia of stomach and duodenum without bleeding: Secondary | ICD-10-CM | POA: Diagnosis not present

## 2024-05-10 DIAGNOSIS — G901 Familial dysautonomia [Riley-Day]: Secondary | ICD-10-CM | POA: Diagnosis not present

## 2024-05-10 DIAGNOSIS — I10 Essential (primary) hypertension: Secondary | ICD-10-CM

## 2024-05-10 DIAGNOSIS — R1310 Dysphagia, unspecified: Secondary | ICD-10-CM | POA: Diagnosis not present

## 2024-05-10 DIAGNOSIS — M87851 Other osteonecrosis, right femur: Secondary | ICD-10-CM | POA: Diagnosis not present

## 2024-05-10 DIAGNOSIS — I951 Orthostatic hypotension: Secondary | ICD-10-CM | POA: Diagnosis not present

## 2024-05-10 DIAGNOSIS — I6522 Occlusion and stenosis of left carotid artery: Secondary | ICD-10-CM | POA: Diagnosis not present

## 2024-05-10 DIAGNOSIS — M166 Other bilateral secondary osteoarthritis of hip: Secondary | ICD-10-CM | POA: Diagnosis not present

## 2024-05-10 DIAGNOSIS — N186 End stage renal disease: Secondary | ICD-10-CM | POA: Diagnosis not present

## 2024-05-10 DIAGNOSIS — M4312 Spondylolisthesis, cervical region: Secondary | ICD-10-CM | POA: Diagnosis not present

## 2024-05-10 DIAGNOSIS — G894 Chronic pain syndrome: Secondary | ICD-10-CM | POA: Diagnosis not present

## 2024-05-10 DIAGNOSIS — G4700 Insomnia, unspecified: Secondary | ICD-10-CM | POA: Diagnosis not present

## 2024-05-10 DIAGNOSIS — M47812 Spondylosis without myelopathy or radiculopathy, cervical region: Secondary | ICD-10-CM | POA: Diagnosis not present

## 2024-05-10 DIAGNOSIS — M19011 Primary osteoarthritis, right shoulder: Secondary | ICD-10-CM | POA: Diagnosis not present

## 2024-05-10 DIAGNOSIS — M8580 Other specified disorders of bone density and structure, unspecified site: Secondary | ICD-10-CM | POA: Diagnosis not present

## 2024-05-10 DIAGNOSIS — M87852 Other osteonecrosis, left femur: Secondary | ICD-10-CM | POA: Diagnosis not present

## 2024-05-10 DIAGNOSIS — J45909 Unspecified asthma, uncomplicated: Secondary | ICD-10-CM | POA: Diagnosis not present

## 2024-05-10 DIAGNOSIS — I088 Other rheumatic multiple valve diseases: Secondary | ICD-10-CM | POA: Diagnosis not present

## 2024-05-10 DIAGNOSIS — K5730 Diverticulosis of large intestine without perforation or abscess without bleeding: Secondary | ICD-10-CM | POA: Diagnosis not present

## 2024-05-10 DIAGNOSIS — I480 Paroxysmal atrial fibrillation: Secondary | ICD-10-CM | POA: Diagnosis not present

## 2024-05-10 DIAGNOSIS — M47816 Spondylosis without myelopathy or radiculopathy, lumbar region: Secondary | ICD-10-CM | POA: Diagnosis not present

## 2024-05-12 DIAGNOSIS — N186 End stage renal disease: Secondary | ICD-10-CM | POA: Diagnosis not present

## 2024-05-12 DIAGNOSIS — N2581 Secondary hyperparathyroidism of renal origin: Secondary | ICD-10-CM | POA: Diagnosis not present

## 2024-05-12 DIAGNOSIS — E1122 Type 2 diabetes mellitus with diabetic chronic kidney disease: Secondary | ICD-10-CM | POA: Diagnosis not present

## 2024-05-12 DIAGNOSIS — L299 Pruritus, unspecified: Secondary | ICD-10-CM | POA: Diagnosis not present

## 2024-05-12 DIAGNOSIS — D509 Iron deficiency anemia, unspecified: Secondary | ICD-10-CM | POA: Diagnosis not present

## 2024-05-14 ENCOUNTER — Other Ambulatory Visit: Payer: Self-pay | Admitting: Student

## 2024-05-14 ENCOUNTER — Other Ambulatory Visit: Payer: Self-pay | Admitting: *Deleted

## 2024-05-14 DIAGNOSIS — M71552 Other bursitis, not elsewhere classified, left hip: Secondary | ICD-10-CM | POA: Diagnosis not present

## 2024-05-14 DIAGNOSIS — M71551 Other bursitis, not elsewhere classified, right hip: Secondary | ICD-10-CM | POA: Diagnosis not present

## 2024-05-14 DIAGNOSIS — G894 Chronic pain syndrome: Secondary | ICD-10-CM

## 2024-05-14 MED ORDER — LIDOCAINE 5 % EX PTCH: 1.0000 | MEDICATED_PATCH | Freq: Every day | CUTANEOUS | 2 refills | Status: DC | PRN

## 2024-05-14 NOTE — Telephone Encounter (Unsigned)
 Copied from CRM #[id number]. Topic: Clinical - Medication Refill >> May 14, 2024 10:25 AM Susanna ORN wrote: Medication: lidocaine  (LIDODERM ) 5 %  Has the patient contacted their pharmacy? No (Agent: If no, request that the patient contact the pharmacy for the refill. If patient does not wish to contact the pharmacy document the reason why and proceed with request.) (Agent: If yes, when and what did the pharmacy advise?)  This is the patient's preferred pharmacy:   [id number] - [place], [place] - [address] [place] [address] Phone: [phone] Fax: [phone]   Is this the correct pharmacy for this prescription? Yes If no, delete pharmacy and type the correct one.   Has the prescription been filled recently? Yes  Is the patient out of the medication? Yes  Has the patient been seen for an appointment in the last year OR does the patient have an upcoming appointment? Yes  Can we respond through MyChart? No  Agent: Please be advised that Rx refills may take up to 3 business days. We ask that you follow-up with your pharmacy.

## 2024-05-14 NOTE — Patient Outreach (Signed)
 Complex Care Management   Visit Note  05/14/2024  Name:  Audrey Hill MRN: [id number] DOB: 01-26-1956  RNCM spoke with daughter Artelia) who indicates pt underwent an injection shot and she was still sleeping. Daughter did not wish to wake her and requested a call back tomorrow during patient's dialysis time at 12:00 pm to rescheduled today's appointment for a later date.  RNCM will request care-guide to follow up for rescheduling.  Olam Ku, RN, BSN [place]  [facility], [facility] RN Care Manager Direct Dial : [phone]  Fax: [phone]

## 2024-05-14 NOTE — Telephone Encounter (Signed)
 Medication last refilled 04/12/24 with 2 refills.

## 2024-05-14 NOTE — H&P (Signed)
 [facility]  PATIENT NAME: Audrey Hill PATIENT MRN: [id number] DATE OF SURGERY: 05/14/2024  Identification: Audrey Hill is a 68 y.o. year old female.   History of Present Illness Update from Previous Visit:  Reviewed, no changes from last visit  Past Medical History: Medical History[1]  Past Surgical History: Surgical History[2]  Family History: Family History[3]  Social History: Reviewed in in record  Allergies: is allergic to acetaminophen , amlodipine , diphenhydramine  hcl, ibuprofen, metoclopramide , nortriptyline, tramadol, aspirin , lisinopril, and nitroglycerin .  Current Medications:  See outpatient medication list  Physical Exam:   Chest: unlabored breathing, equal chest expansion bilaterally, normal rate, no tactile fremius Cardiac: regular rate, no jugular venous distention or peripheral edema Moving all extremities against gravity.   Diagnosis: Acute on Chronic pain : ischial bursitis  Impression Narrative:  Appropriate for procedure. The patient is appropriate for the procedure to be completed in the ambulatory surgery setting.  Patient Education - A description of the planned procedure was given to the patient.  The risks and benefits of the procedure were discussed with the patient and all questions were addressed to the patients satisfaction.  Risks and benefits were discussed at length with the patient. The risks included (but not only) increased pain following the procedure, failure of procedure and the fading of local anesthetic effect.  NPO guidelines were again d/w patient.  PLAN:  Continue with planned procedure. Patient is cleared for surgery/procedural intervention in an ambulatory setting.  Physician Attestation: Seen by Shelly Charity, MD        [1] Past Medical History: Diagnosis Date  . Acute on chronic combined systolic and diastolic CHF (congestive heart failure)    (CMD)  03/11/2017  . Arrhythmia    Afib  . Asthma, unspecified asthma severity, unspecified whether complicated, unspecified whether persistent (CMD)    Med Hx: Asthma;   . Diabetes type 2, uncontrolled   . History of DVT (deep vein thrombosis)   . History of seizures   . History of stroke   . Hyperlipidemia   . Hypertension   . Low back pain radiating to left leg 01/29/2013  . Renal disorder   . Stroke    (CMD)   . Transfusion history   [2] Past Surgical History: Procedure Laterality Date  . BACK SURGERY     Procedure: BACK SURGERY  . HYSTERECTOMY      Procedure: HYSTERECTOMY  . JOINT REPLACEMENT     Procedure: JOINT REPLACEMENT  . KNEE SURGERY     Procedure: KNEE SURGERY  . STEROID INJECTION HIP Bilateral 03/26/2024   Bilateral Greater Trochanteric Bursa Injection BL GTB performed by Shelly Charity, MD at [facility] OR  [3] Family History Problem Relation Name Age of Onset  . Heart disease Mother    . Stroke Other    . Hypertension Other    . Sleep apnea Neg Hx

## 2024-05-15 DIAGNOSIS — D631 Anemia in chronic kidney disease: Secondary | ICD-10-CM | POA: Diagnosis not present

## 2024-05-15 DIAGNOSIS — N2581 Secondary hyperparathyroidism of renal origin: Secondary | ICD-10-CM | POA: Diagnosis not present

## 2024-05-15 DIAGNOSIS — N186 End stage renal disease: Secondary | ICD-10-CM | POA: Diagnosis not present

## 2024-05-15 DIAGNOSIS — E1122 Type 2 diabetes mellitus with diabetic chronic kidney disease: Secondary | ICD-10-CM | POA: Diagnosis not present

## 2024-05-15 DIAGNOSIS — Z992 Dependence on renal dialysis: Secondary | ICD-10-CM | POA: Diagnosis not present

## 2024-05-15 DIAGNOSIS — D509 Iron deficiency anemia, unspecified: Secondary | ICD-10-CM | POA: Diagnosis not present

## 2024-05-15 DIAGNOSIS — L299 Pruritus, unspecified: Secondary | ICD-10-CM | POA: Diagnosis not present

## 2024-05-15 DIAGNOSIS — R1110 Vomiting, unspecified: Secondary | ICD-10-CM | POA: Diagnosis not present

## 2024-05-16 ENCOUNTER — Telehealth: Payer: Self-pay | Admitting: Student

## 2024-05-16 ENCOUNTER — Telehealth: Payer: Self-pay | Admitting: *Deleted

## 2024-05-16 ENCOUNTER — Encounter: Payer: Self-pay | Admitting: *Deleted

## 2024-05-16 NOTE — Patient Instructions (Signed)
 Audrey Hill - I am sorry I was unable to reach you today for our scheduled appointment. I work with [name], DO and am calling to support your healthcare needs. Please contact me at [phone] at your earliest convenience. I look forward to speaking with you soon.   Thank you,   Olam Ku, RN, BSN [place]  [facility], [facility] RN Care Manager Direct Dial : [phone]  Fax: [phone]

## 2024-05-16 NOTE — Patient Instructions (Signed)
 Heron GORMAN Sauers - I am sorry I was unable to reach you today for our scheduled appointment. I work with [name], DO and am calling to support your healthcare needs. Please contact me at [phone] at your earliest convenience. I look forward to speaking with you soon.   Thank you,   Olam Ku, RN, BSN [place]  [facility], [facility] RN Care Manager Direct Dial : [phone]  Fax: [phone]

## 2024-05-16 NOTE — Patient Outreach (Signed)
 Complex Care Management   Visit Note  05/16/2024  Name:  Audrey Hill MRN: [id number] DOB: 27-May-1956  RNCM had an unsuccessful outreach to a scheduled telephone appointment today with this patient as RNCM left a voice message requesting a call back to rescheduled with the care-guide at her convenience. Patient/caregiver contacted her provider's office to request this RNCM to call back for this missed appointment.  RNCM returned another call to the patient/caregiver however unsuccessful with the 2nd outreach. RNCM left another voice message requesting a call back to the care-guide with a number [phone] provided to rescheduled another appointment.   Will continue outreach calls accordingly as scheduled.  Olam Ku, RN, BSN [place]  [facility], [facility] RN Care Manager Direct Dial : [phone]  Fax: [phone]

## 2024-05-16 NOTE — Telephone Encounter (Signed)
 Message has been forwarded.      Copied from CRM #[id number]. Topic: General - Other >> May 16, 2024  1:08 PM Adrianna P wrote: Reason for CRM: patient missed call for appt, please call patient back

## 2024-05-17 DIAGNOSIS — N186 End stage renal disease: Secondary | ICD-10-CM | POA: Diagnosis not present

## 2024-05-19 DIAGNOSIS — N186 End stage renal disease: Secondary | ICD-10-CM | POA: Diagnosis not present

## 2024-05-22 DIAGNOSIS — E1122 Type 2 diabetes mellitus with diabetic chronic kidney disease: Secondary | ICD-10-CM | POA: Diagnosis not present

## 2024-05-22 DIAGNOSIS — N2581 Secondary hyperparathyroidism of renal origin: Secondary | ICD-10-CM | POA: Diagnosis not present

## 2024-05-22 DIAGNOSIS — Z992 Dependence on renal dialysis: Secondary | ICD-10-CM | POA: Diagnosis not present

## 2024-05-22 DIAGNOSIS — R1110 Vomiting, unspecified: Secondary | ICD-10-CM | POA: Diagnosis not present

## 2024-05-22 DIAGNOSIS — D509 Iron deficiency anemia, unspecified: Secondary | ICD-10-CM | POA: Diagnosis not present

## 2024-05-22 DIAGNOSIS — L299 Pruritus, unspecified: Secondary | ICD-10-CM | POA: Diagnosis not present

## 2024-05-22 DIAGNOSIS — D631 Anemia in chronic kidney disease: Secondary | ICD-10-CM | POA: Diagnosis not present

## 2024-05-22 DIAGNOSIS — N186 End stage renal disease: Secondary | ICD-10-CM | POA: Diagnosis not present

## 2024-05-22 DIAGNOSIS — R110 Nausea: Secondary | ICD-10-CM | POA: Diagnosis not present

## 2024-05-23 DIAGNOSIS — M166 Other bilateral secondary osteoarthritis of hip: Secondary | ICD-10-CM | POA: Diagnosis not present

## 2024-05-23 DIAGNOSIS — D631 Anemia in chronic kidney disease: Secondary | ICD-10-CM | POA: Diagnosis not present

## 2024-05-23 DIAGNOSIS — G894 Chronic pain syndrome: Secondary | ICD-10-CM | POA: Diagnosis not present

## 2024-05-23 DIAGNOSIS — M87852 Other osteonecrosis, left femur: Secondary | ICD-10-CM | POA: Diagnosis not present

## 2024-05-23 DIAGNOSIS — I951 Orthostatic hypotension: Secondary | ICD-10-CM | POA: Diagnosis not present

## 2024-05-23 DIAGNOSIS — I480 Paroxysmal atrial fibrillation: Secondary | ICD-10-CM | POA: Diagnosis not present

## 2024-05-23 DIAGNOSIS — N186 End stage renal disease: Secondary | ICD-10-CM | POA: Diagnosis not present

## 2024-05-23 DIAGNOSIS — I5023 Acute on chronic systolic (congestive) heart failure: Secondary | ICD-10-CM | POA: Diagnosis not present

## 2024-05-23 DIAGNOSIS — J45909 Unspecified asthma, uncomplicated: Secondary | ICD-10-CM | POA: Diagnosis not present

## 2024-05-23 DIAGNOSIS — M47816 Spondylosis without myelopathy or radiculopathy, lumbar region: Secondary | ICD-10-CM | POA: Diagnosis not present

## 2024-05-23 DIAGNOSIS — I088 Other rheumatic multiple valve diseases: Secondary | ICD-10-CM | POA: Diagnosis not present

## 2024-05-23 DIAGNOSIS — J9621 Acute and chronic respiratory failure with hypoxia: Secondary | ICD-10-CM | POA: Diagnosis not present

## 2024-05-23 DIAGNOSIS — G901 Familial dysautonomia [Riley-Day]: Secondary | ICD-10-CM | POA: Diagnosis not present

## 2024-05-23 DIAGNOSIS — M47812 Spondylosis without myelopathy or radiculopathy, cervical region: Secondary | ICD-10-CM | POA: Diagnosis not present

## 2024-05-23 DIAGNOSIS — K5730 Diverticulosis of large intestine without perforation or abscess without bleeding: Secondary | ICD-10-CM | POA: Diagnosis not present

## 2024-05-23 DIAGNOSIS — M4312 Spondylolisthesis, cervical region: Secondary | ICD-10-CM | POA: Diagnosis not present

## 2024-05-23 DIAGNOSIS — I6522 Occlusion and stenosis of left carotid artery: Secondary | ICD-10-CM | POA: Diagnosis not present

## 2024-05-23 DIAGNOSIS — G4700 Insomnia, unspecified: Secondary | ICD-10-CM | POA: Diagnosis not present

## 2024-05-23 DIAGNOSIS — R1310 Dysphagia, unspecified: Secondary | ICD-10-CM | POA: Diagnosis not present

## 2024-05-23 DIAGNOSIS — M87851 Other osteonecrosis, right femur: Secondary | ICD-10-CM | POA: Diagnosis not present

## 2024-05-23 DIAGNOSIS — M8580 Other specified disorders of bone density and structure, unspecified site: Secondary | ICD-10-CM | POA: Diagnosis not present

## 2024-05-23 DIAGNOSIS — K31819 Angiodysplasia of stomach and duodenum without bleeding: Secondary | ICD-10-CM | POA: Diagnosis not present

## 2024-05-23 DIAGNOSIS — I132 Hypertensive heart and chronic kidney disease with heart failure and with stage 5 chronic kidney disease, or end stage renal disease: Secondary | ICD-10-CM | POA: Diagnosis not present

## 2024-05-23 DIAGNOSIS — M19011 Primary osteoarthritis, right shoulder: Secondary | ICD-10-CM | POA: Diagnosis not present

## 2024-05-24 DIAGNOSIS — Z992 Dependence on renal dialysis: Secondary | ICD-10-CM | POA: Diagnosis not present

## 2024-05-28 ENCOUNTER — Other Ambulatory Visit (INDEPENDENT_AMBULATORY_CARE_PROVIDER_SITE_OTHER)

## 2024-05-28 DIAGNOSIS — I10 Essential (primary) hypertension: Secondary | ICD-10-CM

## 2024-05-28 MED ORDER — HYDRALAZINE HCL 50 MG PO TABS: 50.0000 mg | ORAL_TABLET | Freq: Three times a day (TID) | ORAL | 11 refills | Status: DC

## 2024-05-28 NOTE — Progress Notes (Unsigned)
 05/28/2024 Name: Audrey Hill MRN: [id number] DOB: 1956-07-28  Chief Complaint  Patient presents with   Medication Management    Audrey Hill is a 68 y.o. year old female who presented for a telephone visit.   They were referred to the pharmacist by their PCP for assistance in managing complex medication management and polypharmacy. PMH includes HTN, Afib, HFrEF, pulmonary HTN, asthma, GERD, ESRD on dialysis, HLD, MDD, chronic pain.   Subjective: Patient was last seen by Melvenia Morrison, MD, on 05/04/24 for an acute viral illness. She was given toradol  in office and instructed to increase trazodone  to 100 mg nightly for insomnia (she was prescribed a 50 mg tablet to take in addition to the 50 mg tablets included in her pill packs).   Today, patient reports doing ok. She does not have any questions or concerns about her medications at this time. She reports that the extra trazodone  50 mg nightly (100 mg total) has not helped with her insomnia. She asks if she can stop this medication, or if there are other options. She confirms that her hydralazine  has been filled in a bottle from [facility] separate from her pill packs.   Care Team: Primary Care Provider: [name], DO ; needs to be scheduled ~ Feb 2026  Medication Access/Adherence  Current Pharmacy:  [facility], [place] - [address] [place] [address] Phone: [phone] Fax: [phone]  [id number] - [place], [place] - [address] [place] [address] Phone: [phone] Fax: [phone]  [facility] - [place]  - [place], [place] - [address] [place] [address] Phone: [phone] Fax: [phone]  [id number] - [place], [place] - [address] [place] [address] Phone: [phone] Fax:  [phone]   Patient reports affordability concerns with their medications: No  - [employer] Dual Complete Patient reports access/transportation concerns to their pharmacy: No  Patient reports adherence concerns with their medications:  No  - reports the only medication she wants to make sure she can refill is her pain  medication  Medication Management:  Current adherence strategy: [facility] pill packs  Called [facility] to confirm what all was in her most recent pill pack - Amiodarone  200 mg daily - Atorvastatin  40 mg daily - Furosemide  80 mg daily - Isosorbide  dinitrate 20 mg TID - Losartan  50 mg daily - Pantoprazole  40 mg daily - Rena-vites - Sertraline  25 mg daily - trazodone  50 mg (reports that she is taking additional trazodone  50 mg as prescribed, 100 mg total)  [facility] also filled:  - Albuterol  inhaler - Hydralazine  25 mg (in a bottle) - written for 15 day supply (2 tabs TID) and needs to be 30 day supply for it to go in packs. Patient reports she is taking one tab (25 mg) TID. - Filled carvedilol  last month, then was put on hold (3.125 mg). Patient advised to discontinue due to hypotension on 05/02/24.  Sevelamer  last filled 11/30/23 for 30ds from [facility]  Patient reports Good adherence to medications  Hypertension/HFrEF (EF 30-35%, Grade III diastolic dysfunction with severe LVH, normal RV, bi-atrial enlargement in Oct 2022):  Current medications: hydralazine  50 mg TID (patient taking 25 mg TID), losartan  50 mg daily Medications previously tried: carvedilol  - hypotension  Patient does not have a validated, automated, upper arm home  BP cuff Current blood pressure readings readings: last BP at dialysis on 05/22/24 was 142/86  Patient denies hypotensive s/sx including dizziness, lightheadedness.  Patient denies hypertensive symptoms including headache, chest pain, shortness of breath   Objective:  BP Readings from Last 3 Encounters:  05/04/24 (!) 145/70  04/13/24 (!)  155/80  04/05/24 (!) 176/99    Lab Results  Component Value Date   HGBA1C 5.6 08/03/2021   HGBA1C 5.7 (H) 08/23/2011       Latest Ref Rng & Units 04/04/2024    5:23 PM 04/01/2024   11:26 PM 03/28/2024    1:15 AM  BMP  Glucose 70 - 99 mg/dL 78  87  74   BUN 8 - 23 mg/dL 30  33  22   Creatinine 0.44 - 1.00 mg/dL 3.59  3.68  4.93   Sodium 135 - 145 mmol/L 139  138  134   Potassium 3.5 - 5.1 mmol/L 4.0  4.4  3.7   Chloride 98 - 111 mmol/L 98  97  97   CO2 22 - 32 mmol/L 29  28  28    Calcium  8.9 - 10.3 mg/dL 89.9  89.8  8.8     Lab Results  Component Value Date   CHOL 113 08/05/2022   HDL 72 08/05/2022   LDLCALC 34 08/05/2022   TRIG 33 08/05/2022   CHOLHDL 1.6 08/05/2022    Medications Reviewed Today     Reviewed by Brinda Lorain SQUIBB, RPH (Pharmacist) on 05/28/24 at 1358  Med List Status: <None>   Medication Order Taking? Sig Documenting Provider Last Dose Status Informant  albuterol  (VENTOLIN  HFA) 108 (90 Base) MCG/ACT inhaler [id number] Yes INHALE 2 PUFFS BY MOUTH EVERY 6 HOURS AS NEEDED FOR COUGH [name], DO  Active   amiodarone  (PACERONE ) 200 MG tablet [id number] Yes Take 1 tablet (200 mg total) by mouth daily. [name], DO  Active Self, Pharmacy Records  atorvastatin  (LIPITOR) 40 MG tablet [id number] Yes Take 1 tablet (40 mg total) by mouth daily. [name], DO  Active Self, Pharmacy Records    Discontinued 05/28/24 1353 (Change in therapy) diclofenac  Sodium (VOLTAREN ) 1 % GEL [id number]  Apply 4 g topically 4 (four) times daily. Renne Homans, MD  Active Self, Pharmacy Records           Med Note [facility], DONETA GORMAN [place] Feb 29, 2024 12:35 AM) Not effective  Difelikefalin Acetate (KORSUVA) 65 MCG/1.3ML SOLN [id number]  0.6 mLs. [provider]  Active   Doxercalciferol  (HECTOROL  IV) [id number]  3 mcg. [provider]  Active   ferrous sulfate 325 (65 FE) MG tablet [id number]  Take 325 mg by mouth every other day. [provider]  Active Self, Pharmacy  Records           Med Note (WHITE, DONETA GORMAN   Wed Feb 29, 2024 12:34 AM) Ran out  furosemide  (LASIX ) 80 MG tablet [id number] Yes Take 1 tablet (80 mg total) by mouth daily. Gabino Boga, MD  Active Self, Pharmacy Records  hydrALAZINE  (APRESOLINE ) 25 MG tablet [id number] Yes TAKE 2 TABLETS BY MOUTH THREE TIMES A DAY  Patient taking differently: Take 25 mg by mouth 3 (three) times daily.   [name], DO  Active   HYDROmorphone  (DILAUDID ) 2 MG tablet [id number] Yes Take 1 tablet (2 mg total) by mouth every 12 (twelve) hours as needed for severe pain (pain score 7-10). Rosan Dayton BROCKS, DO  Active   HYDROmorphone  (DILAUDID ) 2 MG tablet  [id number]  Take 1 tablet (2 mg total) by mouth every 12 (twelve) hours as needed for severe pain (pain score 7-10). Rosan Dayton BROCKS, DO  Active   HYDROmorphone  (DILAUDID ) 2 MG tablet [id number]  Take 1 tablet (2 mg total) by mouth every 12 (twelve) hours as needed for severe pain (pain score 7-10). Rosan Dayton BROCKS, DO  Active   isosorbide  dinitrate (ISORDIL ) 20 MG tablet [id number] Yes TAKE 1 TABLET (20 MG TOTAL) BY MOUTH 3 (THREE) TIMES DAILY. Rosan Dayton BROCKS, DO  Active   lidocaine  (LIDODERM ) 5 % [id number] Yes Place 1 patch onto the skin daily as needed. Remove & Discard patch within 12 hours or as directed by MD [name], DO  Active   losartan  (COZAAR ) 50 MG tablet [id number] Yes Take 1 tablet (50 mg total) by mouth daily. [name], DO  Active Self, Pharmacy Records  Methoxy PEG-Epoetin Beta [facility] IJ) [id number]  100 mcg. [provider]  Active   multivitamin (RENA-VIT) TABS tablet [id number] Yes Take 1 tablet by mouth daily. [name], DO  Active Self, Pharmacy Records    Discontinued 05/28/24 1354 (No longer needed (for PRN medications))   pantoprazole  (PROTONIX ) 40 MG tablet [id number] Yes Take 1 tablet (40 mg total) by mouth daily. [name], DO  Active   polyethylene glycol (MIRALAX  / GLYCOLAX ) 17 g packet [id number]  Take 17 g by mouth daily.  [provider]  Active Self, Pharmacy Records           Med Note CHRISTIE, [facility]   Mon Jan 09, 2024  9:45 AM)    senna (SENOKOT) 8.6 MG TABS tablet [id number]  Take 2 tablets by mouth daily as needed for mild constipation. [provider]  Active Self, Pharmacy Records  sertraline  (ZOLOFT ) 25 MG tablet [id number] Yes Take 1 tablet (25 mg total) by mouth daily. [name], DO  Active Self, Pharmacy Records  sevelamer  carbonate (RENVELA ) 800 MG tablet [id number]  Take 1 tablet (800 mg total) by mouth 3 (three) times daily with meals. [name], DO  Active Self, Pharmacy Records           Med Note SIMMIE, RAVEN M   Wed Feb 22, 2024 11:49 AM) Replace Velphoro  for now  traZODone  (DESYREL ) 50 MG tablet [id number] Yes Take 1 tablet (50 mg total) by mouth at bedtime. Zheng, Michael, DO  Active   traZODone  (DESYREL ) 50 MG tablet [id number]  Take 1 tablet of trazodone  50mg  in addition to the 50mg  in your pill pack currently for a total of 100mg  (2 tablets) Smucker, Nathanael, MD  Active   triamcinolone (KENALOG) 0.025 % ointment [id number]  Apply 1 Application topically daily as needed (shin). [provider]  Active Self, Pharmacy Records  Med List Note (Horton, Jeannetta, CPhT 01/09/24 0935): Dialysis Tues., Thurs and Saturday              Assessment/Plan:   Heart Failure: - Currently appropriately managed. Patient is not having s/sx of hypotension since discontinuing carvedilol . She is taking a lower dose of hydralazine  than prescribed. BP at dialysis are slightly elevated. Would recommend increasing hydralazine  to 50 mg TID as prescribed. - Patient has not been able to access a blood pressure cuff and is not currently monitoring at home - Weight gain is monitored and managed at dialysis - Recommend to continue current regimen  Current medications:  ACEi/ARB/ARNI: losartan  50 mg daily SGLT2i: none - ESRD Beta blocker: discontinued carvedilol  due to  hypotension Mineralocorticoid Receptor  Antagonist: none- ESRD Diuretic regimen: furosemide  80 mg daily Hydral/nitrate: hydralazine  50 mg TID, isosorbide  dinitrate 20 mg TID   Medication Management: - Currently strategy sufficient to maintain appropriate adherence to prescribed medication regimen - Will collaborate with PCP to update Rx for hydralazine  so that it will be included in next pack   Patient verbalized understanding of treatment plan.    Follow Up Plan:  Pharmacist 07/23/24 PCP clinic visit needs to be scheduled ~ Feb 2026   Lorain Baseman, PharmD [facility] [phone]

## 2024-05-29 ENCOUNTER — Other Ambulatory Visit: Payer: Self-pay | Admitting: *Deleted

## 2024-05-29 DIAGNOSIS — D631 Anemia in chronic kidney disease: Secondary | ICD-10-CM | POA: Diagnosis not present

## 2024-05-29 DIAGNOSIS — L299 Pruritus, unspecified: Secondary | ICD-10-CM | POA: Diagnosis not present

## 2024-05-29 DIAGNOSIS — N2581 Secondary hyperparathyroidism of renal origin: Secondary | ICD-10-CM | POA: Diagnosis not present

## 2024-05-29 DIAGNOSIS — Z992 Dependence on renal dialysis: Secondary | ICD-10-CM | POA: Diagnosis not present

## 2024-05-29 DIAGNOSIS — R1110 Vomiting, unspecified: Secondary | ICD-10-CM | POA: Diagnosis not present

## 2024-05-29 DIAGNOSIS — E1122 Type 2 diabetes mellitus with diabetic chronic kidney disease: Secondary | ICD-10-CM | POA: Diagnosis not present

## 2024-05-29 DIAGNOSIS — D509 Iron deficiency anemia, unspecified: Secondary | ICD-10-CM | POA: Diagnosis not present

## 2024-05-29 DIAGNOSIS — R110 Nausea: Secondary | ICD-10-CM | POA: Diagnosis not present

## 2024-05-29 MED ORDER — HYDRALAZINE HCL 50 MG PO TABS: 50.0000 mg | ORAL_TABLET | Freq: Three times a day (TID) | ORAL | 11 refills | Status: DC

## 2024-05-29 MED ORDER — FUROSEMIDE 80 MG PO TABS: 80.0000 mg | ORAL_TABLET | Freq: Every day | ORAL | 2 refills | Status: DC

## 2024-05-31 DIAGNOSIS — N186 End stage renal disease: Secondary | ICD-10-CM | POA: Diagnosis not present

## 2024-06-02 DIAGNOSIS — D631 Anemia in chronic kidney disease: Secondary | ICD-10-CM | POA: Diagnosis not present

## 2024-06-03 DIAGNOSIS — N186 End stage renal disease: Secondary | ICD-10-CM | POA: Diagnosis not present

## 2024-06-03 DIAGNOSIS — I129 Hypertensive chronic kidney disease with stage 1 through stage 4 chronic kidney disease, or unspecified chronic kidney disease: Secondary | ICD-10-CM | POA: Diagnosis not present

## 2024-06-03 DIAGNOSIS — Z992 Dependence on renal dialysis: Secondary | ICD-10-CM | POA: Diagnosis not present

## 2024-06-05 ENCOUNTER — Ambulatory Visit

## 2024-06-05 DIAGNOSIS — N2581 Secondary hyperparathyroidism of renal origin: Secondary | ICD-10-CM | POA: Diagnosis not present

## 2024-06-05 DIAGNOSIS — J9612 Chronic respiratory failure with hypercapnia: Secondary | ICD-10-CM | POA: Diagnosis not present

## 2024-06-05 DIAGNOSIS — D649 Anemia, unspecified: Secondary | ICD-10-CM | POA: Diagnosis not present

## 2024-06-05 DIAGNOSIS — Z992 Dependence on renal dialysis: Secondary | ICD-10-CM | POA: Diagnosis not present

## 2024-06-05 DIAGNOSIS — E1122 Type 2 diabetes mellitus with diabetic chronic kidney disease: Secondary | ICD-10-CM | POA: Diagnosis not present

## 2024-06-05 DIAGNOSIS — R110 Nausea: Secondary | ICD-10-CM | POA: Diagnosis not present

## 2024-06-05 DIAGNOSIS — L299 Pruritus, unspecified: Secondary | ICD-10-CM | POA: Diagnosis not present

## 2024-06-05 DIAGNOSIS — R1110 Vomiting, unspecified: Secondary | ICD-10-CM | POA: Diagnosis not present

## 2024-06-05 DIAGNOSIS — D631 Anemia in chronic kidney disease: Secondary | ICD-10-CM | POA: Diagnosis not present

## 2024-06-09 ENCOUNTER — Emergency Department (HOSPITAL_COMMUNITY)

## 2024-06-09 ENCOUNTER — Emergency Department (HOSPITAL_COMMUNITY)
Admission: EM | Admit: 2024-06-09 | Discharge: 2024-06-09 | Disposition: A | Discharge status: Home / Self Care | Attending: Emergency Medicine | Admitting: Emergency Medicine

## 2024-06-09 ENCOUNTER — Other Ambulatory Visit: Payer: Self-pay

## 2024-06-09 ENCOUNTER — Encounter (HOSPITAL_COMMUNITY): Payer: Self-pay | Admitting: *Deleted

## 2024-06-09 DIAGNOSIS — R1013 Epigastric pain: Secondary | ICD-10-CM | POA: Diagnosis not present

## 2024-06-09 DIAGNOSIS — R7989 Other specified abnormal findings of blood chemistry: Secondary | ICD-10-CM | POA: Diagnosis not present

## 2024-06-09 DIAGNOSIS — Z992 Dependence on renal dialysis: Secondary | ICD-10-CM | POA: Insufficient documentation

## 2024-06-09 DIAGNOSIS — N186 End stage renal disease: Secondary | ICD-10-CM | POA: Insufficient documentation

## 2024-06-09 DIAGNOSIS — R109 Unspecified abdominal pain: Secondary | ICD-10-CM | POA: Diagnosis present

## 2024-06-09 DIAGNOSIS — R1011 Right upper quadrant pain: Secondary | ICD-10-CM | POA: Diagnosis not present

## 2024-06-09 DIAGNOSIS — I517 Cardiomegaly: Secondary | ICD-10-CM | POA: Diagnosis not present

## 2024-06-09 DIAGNOSIS — Z743 Need for continuous supervision: Secondary | ICD-10-CM | POA: Diagnosis not present

## 2024-06-09 DIAGNOSIS — R1012 Left upper quadrant pain: Secondary | ICD-10-CM | POA: Insufficient documentation

## 2024-06-09 DIAGNOSIS — I509 Heart failure, unspecified: Secondary | ICD-10-CM | POA: Insufficient documentation

## 2024-06-09 DIAGNOSIS — R1010 Upper abdominal pain, unspecified: Secondary | ICD-10-CM

## 2024-06-09 DIAGNOSIS — I3139 Other pericardial effusion (noninflammatory): Secondary | ICD-10-CM | POA: Diagnosis not present

## 2024-06-09 DIAGNOSIS — R0789 Other chest pain: Secondary | ICD-10-CM | POA: Diagnosis not present

## 2024-06-09 DIAGNOSIS — R079 Chest pain, unspecified: Secondary | ICD-10-CM | POA: Diagnosis not present

## 2024-06-09 LAB — COMPREHENSIVE METABOLIC PANEL WITH GFR
ALT: 99 U/L — ABNORMAL HIGH (ref 0–44)
AST: 70 U/L — ABNORMAL HIGH (ref 15–41)
Albumin: 3.5 g/dL (ref 3.5–5.0)
Alkaline Phosphatase: 169 U/L — ABNORMAL HIGH (ref 38–126)
Anion gap: 10 (ref 5–15)
BUN: 21 mg/dL (ref 8–23)
CO2: 22 mmol/L (ref 22–32)
Calcium: 9 mg/dL (ref 8.9–10.3)
Chloride: 106 mmol/L (ref 98–111)
Creatinine, Ser: 3.8 mg/dL — ABNORMAL HIGH (ref 0.44–1.00)
GFR, Estimated: 12 mL/min — ABNORMAL LOW (ref >60)
Glucose, Bld: 101 mg/dL — ABNORMAL HIGH (ref 70–99)
Potassium: 4.4 mmol/L (ref 3.5–5.1)
Sodium: 138 mmol/L (ref 135–145)
Total Bilirubin: 0.9 mg/dL (ref 0.0–1.2)
Total Protein: 8.2 g/dL — ABNORMAL HIGH (ref 6.5–8.1)

## 2024-06-09 LAB — CBC WITH DIFFERENTIAL/PLATELET
Abs Immature Granulocytes: 0.01 K/uL (ref 0.00–0.07)
Basophils Absolute: 0 K/uL (ref 0.0–0.1)
Basophils Relative: 1 %
Eosinophils Absolute: 0 K/uL (ref 0.0–0.5)
Eosinophils Relative: 1 %
HCT: 38.9 % (ref 36.0–46.0)
Hemoglobin: 12.1 g/dL (ref 12.0–15.0)
Immature Granulocytes: 0 %
Lymphocytes Relative: 10 %
Lymphs Abs: 0.4 K/uL — ABNORMAL LOW (ref 0.7–4.0)
MCH: 26.9 pg (ref 26.0–34.0)
MCHC: 31.1 g/dL (ref 30.0–36.0)
MCV: 86.4 fL (ref 80.0–100.0)
Monocytes Absolute: 0.3 K/uL (ref 0.1–1.0)
Monocytes Relative: 8 %
Neutro Abs: 3.1 K/uL (ref 1.7–7.7)
Neutrophils Relative %: 80 %
Platelets: 170 K/uL (ref 150–400)
RBC: 4.5 MIL/uL (ref 3.87–5.11)
RDW: 18 % — ABNORMAL HIGH (ref 11.5–15.5)
WBC: 3.9 K/uL — ABNORMAL LOW (ref 4.0–10.5)
nRBC: 0 % (ref 0.0–0.2)

## 2024-06-09 LAB — TROPONIN I (HIGH SENSITIVITY)
Troponin I (High Sensitivity): 123 ng/L (ref <18)
Troponin I (High Sensitivity): 106 ng/L (ref <18)

## 2024-06-09 LAB — LIPASE, BLOOD: Lipase: 98 U/L — ABNORMAL HIGH (ref 11–51)

## 2024-06-09 MED ORDER — HYDROMORPHONE HCL 1 MG/ML IJ SOLN
1.0000 mg | Freq: Once | INTRAMUSCULAR | Status: AC
  Administered 2024-06-09: 1 mg via INTRAVENOUS
  Filled 2024-06-09: qty 1

## 2024-06-09 MED ORDER — ONDANSETRON HCL 4 MG/2ML IJ SOLN
4.0000 mg | Freq: Once | INTRAMUSCULAR | Status: AC
  Administered 2024-06-09: 4 mg via INTRAVENOUS
  Filled 2024-06-09: qty 2

## 2024-06-09 MED ORDER — HYDROMORPHONE HCL 1 MG/ML IJ SOLN
0.5000 mg | Freq: Once | INTRAMUSCULAR | Status: AC
  Administered 2024-06-09: 0.5 mg via INTRAVENOUS
  Filled 2024-06-09: qty 1

## 2024-06-09 MED ORDER — HYDROMORPHONE HCL 2 MG PO TABS
2.0000 mg | ORAL_TABLET | Freq: Once | ORAL | Status: AC
  Administered 2024-06-09: 2 mg via ORAL
  Filled 2024-06-09: qty 1

## 2024-06-09 NOTE — ED Provider Notes (Signed)
 [place] [facility] Provider Note   CSN: [id number] Arrival date & time: 06/09/24  1615     Patient presents with: Chest Pain   Audrey Hill is a 68 y.o. female.   HPI 68 year old female presents with abdominal and chest pain.  At around 3:30 PM while finishing up dialysis she started developing sharp pain in her upper abdomen/inferior chest.  Runs up into her neck and shoulder and around to her back.  It is sharp and 8 out of 10 in intensity.  She had some dyspnea that has now resolved.  She vomited earlier this morning before dialysis but otherwise states she has been feeling well up until this episode.  She did complete her dialysis today.  Patient has multiple comorbidities including ESRD on dialysis, CHF, A-fib, chronic pain, and other comorbidities.  Prior to Admission medications   Medication Sig Start Date End Date Taking? Authorizing Provider  albuterol  (VENTOLIN  HFA) 108 (90 Base) MCG/ACT inhaler INHALE 2 PUFFS BY MOUTH EVERY 6 HOURS AS NEEDED FOR COUGH 03/13/24   [name], DO  amiodarone  (PACERONE ) 200 MG tablet Take 1 tablet (200 mg total) by mouth daily. 11/11/23   [name], DO  atorvastatin  (LIPITOR) 40 MG tablet Take 1 tablet (40 mg total) by mouth daily. 12/16/23   [name], DO  diclofenac  Sodium (VOLTAREN ) 1 % GEL Apply 4 g topically 4 (four) times daily. 12/09/23   Amoako, Prince, MD  Difelikefalin Acetate (KORSUVA) 65 MCG/1.3ML SOLN 0.6 mLs. 03/24/24 06/14/24  [provider]  Doxercalciferol  (HECTOROL  IV) 3 mcg. 03/27/24 03/26/25  [provider]  ferrous sulfate 325 (65 FE) MG tablet Take 325 mg by mouth every other day.    [provider]  furosemide  (LASIX ) 80 MG tablet Take 1 tablet (80 mg total) by mouth daily. 05/29/24 08/27/24  [name], DO  hydrALAZINE  (APRESOLINE ) 50 MG tablet Take 1 tablet (50 mg total) by mouth 3 (three) times daily. 05/29/24   [name], DO  HYDROmorphone  (DILAUDID ) 2  MG tablet Take 1 tablet (2 mg total) by mouth every 12 (twelve) hours as needed for severe pain (pain score 7-10). 05/12/24   Rosan Dayton BROCKS, DO  HYDROmorphone  (DILAUDID ) 2 MG tablet Take 1 tablet (2 mg total) by mouth every 12 (twelve) hours as needed for severe pain (pain score 7-10). 06/11/24   Rosan Dayton BROCKS, DO  HYDROmorphone  (DILAUDID ) 2 MG tablet Take 1 tablet (2 mg total) by mouth every 12 (twelve) hours as needed for severe pain (pain score 7-10). 07/11/24   Rosan Dayton BROCKS, DO  isosorbide  dinitrate (ISORDIL ) 20 MG tablet TAKE 1 TABLET (20 MG TOTAL) BY MOUTH 3 (THREE) TIMES DAILY. 04/12/24   Rosan Dayton BROCKS, DO  lidocaine  (LIDODERM ) 5 % Place 1 patch onto the skin daily as needed. Remove & Discard patch within 12 hours or as directed by MD 05/14/24   [name], DO  losartan  (COZAAR ) 50 MG tablet Take 1 tablet (50 mg total) by mouth daily. 11/11/23 11/10/24  [name], DO  Methoxy PEG-Epoetin Beta (MIRCERA IJ) 100 mcg. 04/17/24 04/16/25  [provider]  multivitamin (RENA-VIT) TABS tablet Take 1 tablet by mouth daily. 01/27/24   [name], DO  pantoprazole  (PROTONIX ) 40 MG tablet Take 1 tablet (40 mg total) by mouth daily. 03/27/24   [name], DO  polyethylene glycol (MIRALAX  / GLYCOLAX ) 17 g packet Take 17 g by mouth daily.    [provider]  senna (  SENOKOT) 8.6 MG TABS tablet Take 2 tablets by mouth daily as needed for mild constipation. 12/19/23   [provider]  sertraline  (ZOLOFT ) 25 MG tablet Take 1 tablet (25 mg total) by mouth daily. 02/06/24   [name], DO  sevelamer  carbonate (RENVELA ) 800 MG tablet Take 1 tablet (800 mg total) by mouth 3 (three) times daily with meals. 02/09/24   [name], DO  traZODone  (DESYREL ) 50 MG tablet Take 1 tablet (50 mg total) by mouth at bedtime. 03/03/24 03/03/25  Zheng, Michael, DO  traZODone  (DESYREL ) 50 MG tablet Take 1 tablet of trazodone  50mg  in addition to the 50mg  in your pill pack currently for a total of 100mg  (2 tablets)  05/04/24   Smucker, Melvenia, MD  triamcinolone (KENALOG) 0.025 % ointment Apply 1 Application topically daily as needed (shin). 12/13/23   [provider]    Allergies: Diphenhydramine  hcl, Lisinopril, Metoclopramide , Nortriptyline, Tramadol, Nitroglycerin , Tylenol  [acetaminophen ], Ibuprofen, Norvasc  [amlodipine  besylate], and Nsaids    Review of Systems  Constitutional:  Negative for fever.  Respiratory:  Positive for shortness of breath.   Cardiovascular:  Positive for chest pain.  Gastrointestinal:  Positive for abdominal pain and vomiting.  Musculoskeletal:  Positive for back pain.  Neurological:  Negative for weakness and numbness.    Updated Vital Signs BP (!) 157/81   Pulse 61   Temp 97.6 F (36.4 C) (Oral)   Resp 13   Ht 5' 6 (1.676 m)   Wt 64.3 kg   SpO2 95%   BMI 22.88 kg/m   Physical Exam Vitals and nursing note reviewed. Exam conducted with a chaperone present.  Constitutional:      Appearance: She is well-developed.  HENT:     Head: Normocephalic and atraumatic.  Cardiovascular:     Rate and Rhythm: Normal rate and regular rhythm.     Pulses:          Radial pulses are 2+ on the right side and 2+ on the left side.       Dorsalis pedis pulses are 2+ on the right side and 2+ on the left side.     Heart sounds: Normal heart sounds.  Pulmonary:     Effort: Pulmonary effort is normal.     Breath sounds: Normal breath sounds.  Abdominal:     Palpations: Abdomen is soft.     Tenderness: There is abdominal tenderness in the right upper quadrant, epigastric area and left upper quadrant.     Comments: No rash  Skin:    General: Skin is warm and dry.  Neurological:     Mental Status: She is alert.     (all labs ordered are listed, but only abnormal results are displayed) Labs Reviewed  LIPASE, BLOOD - Abnormal; Notable for the following components:      Result Value   Lipase 98 (*)    All other components within normal limits  COMPREHENSIVE  METABOLIC PANEL WITH GFR - Abnormal; Notable for the following components:   Glucose, Bld 101 (*)    Creatinine, Ser 3.80 (*)    Total Protein 8.2 (*)    AST 70 (*)    ALT 99 (*)    Alkaline Phosphatase 169 (*)    GFR, Estimated 12 (*)    All other components within normal limits  CBC WITH DIFFERENTIAL/PLATELET - Abnormal; Notable for the following components:   WBC 3.9 (*)    RDW 18.0 (*)    Lymphs Abs 0.4 (*)  All other components within normal limits  TROPONIN I (HIGH SENSITIVITY) - Abnormal; Notable for the following components:   Troponin I (High Sensitivity) 123 (*)    All other components within normal limits  TROPONIN I (HIGH SENSITIVITY) - Abnormal; Notable for the following components:   Troponin I (High Sensitivity) 106 (*)    All other components within normal limits    EKG: EKG Interpretation Date/Time:  Saturday June 09 2024 16:23:00 EDT Ventricular Rate:  66 PR Interval:    QRS Duration:  123 QT Interval:  458 QTC Calculation: 480 R Axis:   -29  Text Interpretation: Normal sinus rhythm Left bundle branch block no acute ST/T changes Confirmed by Freddi Hamilton [phone]) on 06/09/2024 4:39:49 PM  Radiology: CT ABDOMEN PELVIS WO CONTRAST Result Date: 06/09/2024 EXAM: CT ABDOMEN AND PELVIS WITHOUT CONTRAST 06/09/2024 06:52:42 PM TECHNIQUE: CT of the abdomen and pelvis was performed without the administration of intravenous contrast. Multiplanar reformatted images are provided for review. Automated exposure control, iterative reconstruction, and/or weight-based adjustment of the mA/kV was utilized to reduce the radiation dose to as low as reasonably achievable. COMPARISON: 04/03/2023 CLINICAL HISTORY: Abdominal pain, acute, nonlocalized. PT here via [facility] for chest pain. Pt had completed her dialysis and then told the staff she was having severe chest pain. When EMS arrived pt was sleeping and staff splashed water on her face to wake her up. Pt refused nitroglycerin   and aspirin  stating allergies. FINDINGS: LOWER CHEST: Cardiomegaly. Small pericardial effusion is new since 04/03/2023. LIVER: The liver is unremarkable. GALLBLADDER AND BILE DUCTS: Gallbladder is unremarkable. No biliary ductal dilatation. SPLEEN: Stable cysts or hemangiomas in the spleen. PANCREAS: No acute abnormality. ADRENAL GLANDS: No acute abnormality. KIDNEYS, URETERS AND BLADDER: Atrophic kidneys. No stones in the kidneys or ureters. No hydronephrosis. No perinephric or periureteral stranding. Urinary bladder is unremarkable. GI AND BOWEL: Normal appendix. Stomach demonstrates no acute abnormality. There is no bowel obstruction. PERITONEUM AND RETROPERITONEUM: No ascites. No free air. VASCULATURE: Aortic atherosclerotic calcification. LYMPH NODES: No lymphadenopathy. REPRODUCTIVE ORGANS: Hysterectomy. 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 No acute osseous abnormality. No focal soft tissue abnormality. IMPRESSION: 1. No acute findings in the abdomen or pelvis. 2. Cardiomegaly with new small pericardial effusion since 04/03/2023. Electronically signed by: Norman Gatlin MD 06/09/2024 07:09 PM EDT RP Workstation: [id number]   DG Chest 2 View Result Date: 06/09/2024 CLINICAL DATA:  Chest pain. EXAM: CHEST - 2 VIEW COMPARISON:  05/02/2024 and CT chest 02/02/2024. FINDINGS: Patient is minimally rotated. Trachea is midline. Heart is markedly enlarged. Lungs are clear. No pleural fluid. Old right rib fractures. IMPRESSION: No acute findings. Electronically Signed   By: Newell Eke M.D.   On: 06/09/2024 17:15     Ultrasound ED Echo  Date/Time: 06/09/2024 8:44 PM  Performed by: Freddi Hamilton, MD Authorized by: Freddi Hamilton, MD   Procedure details:    Indications: chest pain     Views: subxiphoid     Images: archived   Findings:    Pericardium: small pericardial effusion   Impression:    Impression: pericardial effusion present       Medications Ordered in the ED  HYDROmorphone  (DILAUDID ) injection 0.5 mg (0.5 mg Intravenous Given 06/09/24 1637)  HYDROmorphone  (DILAUDID ) injection 1 mg (1 mg Intravenous Given 06/09/24 1814)  ondansetron  (ZOFRAN ) injection 4 mg (4 mg Intravenous Given 06/09/24 1814)  HYDROmorphone  (DILAUDID ) tablet 2 mg (2 mg Oral Given 06/09/24 2022)  Medical Decision Making Amount and/or Complexity of Data Reviewed Labs: ordered.    Details: Troponins elevated but flat and similar compared to baseline Radiology: ordered and independent interpretation performed.    Details: No bowel obstruction ECG/medicine tests: ordered and independent interpretation performed.    Details: No ischemia  Risk Prescription drug management.   Patient presents with abdominal/chest pain.  She is resting comfortably.  She has strong and equal pulses in all 4 extremities.  My suspicion of any aortic dissection is quite low.  Also doubt ACS and PE.  She does have multiple comorbidities though also has chronic pain and the morning talk to the patient it seems like she ran out of her chronic pain medicine yesterday.  She has had similar presentations of abdominal pain and nausea with or without vomiting before.  CT is overall unremarkable from an abdominal perspective though there is a small pericardial effusion seen.  However when I did a bedside ultrasound, this is indeed quite small and not circumferential.  I highly doubt this is clinically significant today.  I think she can follow-up as an outpatient with cardiology but I do not think admission or emergent echo is needed.  Troponins are minimally elevated but flat and stable compared to baseline so I do not think MI is likely.  At this point, we will give some of her home pain medicines but I discussed that I cannot refill her chronic pain medicines here in the emergency department and she will need to see her PCP.  Discharged home with return  precautions.     Final diagnoses:  Upper abdominal pain  Pericardial effusion    ED Discharge Orders     None          Freddi Hamilton, MD 06/09/24 2045

## 2024-06-09 NOTE — ED Triage Notes (Signed)
 PT here via [facility] for chest pain.  Pt had completed her dialysis and then told the staff she was having severe chest pain.  When EMS arrived pt was sleeping and staff splashed water on her face to wake her up.    Pt refused nitroglycerin  and aspirin  stating stating allergies.   VS stable  Bp 140/78 Hr 66 nsr

## 2024-06-09 NOTE — Discharge Instructions (Addendum)
 Fortunately your workup does not show any significant abnormality such as a heart attack or bowel blockage.  There is a very small amount of fluid around your heart, which will need to be followed-up by a cardiologist.  Otherwise, follow-up with your primary care provider.  If you develop any new or worsening symptoms then return to the ER.

## 2024-06-10 ENCOUNTER — Telehealth: Payer: Self-pay | Admitting: Student

## 2024-06-10 NOTE — Telephone Encounter (Signed)
 Received after-hours call regarding chronic prescription opioid refill.  She has refill previously authorized for tomorrow, 06/11/24, then again on 07/11/24.  I advised she follow-up in clinic every three months as long as chronic prescription opioids are being prescribed, she will return to clinic around end of October.  Ozell Kung MD 06/10/2024, 4:02 PM

## 2024-06-11 ENCOUNTER — Other Ambulatory Visit: Payer: Self-pay | Admitting: Student

## 2024-06-11 DIAGNOSIS — K2100 Gastro-esophageal reflux disease with esophagitis, without bleeding: Secondary | ICD-10-CM

## 2024-06-12 ENCOUNTER — Other Ambulatory Visit: Payer: Self-pay | Admitting: Student

## 2024-06-12 DIAGNOSIS — L299 Pruritus, unspecified: Secondary | ICD-10-CM | POA: Diagnosis not present

## 2024-06-12 DIAGNOSIS — D649 Anemia, unspecified: Secondary | ICD-10-CM | POA: Diagnosis not present

## 2024-06-12 DIAGNOSIS — E1122 Type 2 diabetes mellitus with diabetic chronic kidney disease: Secondary | ICD-10-CM | POA: Diagnosis not present

## 2024-06-12 DIAGNOSIS — D631 Anemia in chronic kidney disease: Secondary | ICD-10-CM | POA: Diagnosis not present

## 2024-06-12 DIAGNOSIS — M8700 Idiopathic aseptic necrosis of unspecified bone: Secondary | ICD-10-CM

## 2024-06-12 DIAGNOSIS — N186 End stage renal disease: Secondary | ICD-10-CM | POA: Diagnosis not present

## 2024-06-12 DIAGNOSIS — R1110 Vomiting, unspecified: Secondary | ICD-10-CM | POA: Diagnosis not present

## 2024-06-12 DIAGNOSIS — R110 Nausea: Secondary | ICD-10-CM | POA: Diagnosis not present

## 2024-06-12 DIAGNOSIS — Z992 Dependence on renal dialysis: Secondary | ICD-10-CM | POA: Diagnosis not present

## 2024-06-12 DIAGNOSIS — N2581 Secondary hyperparathyroidism of renal origin: Secondary | ICD-10-CM | POA: Diagnosis not present

## 2024-06-12 NOTE — Telephone Encounter (Signed)
 Medication sent to pharmacy

## 2024-06-14 DIAGNOSIS — N186 End stage renal disease: Secondary | ICD-10-CM | POA: Diagnosis not present

## 2024-06-14 DIAGNOSIS — D631 Anemia in chronic kidney disease: Secondary | ICD-10-CM | POA: Diagnosis not present

## 2024-06-14 DIAGNOSIS — L299 Pruritus, unspecified: Secondary | ICD-10-CM | POA: Diagnosis not present

## 2024-06-14 DIAGNOSIS — Z992 Dependence on renal dialysis: Secondary | ICD-10-CM | POA: Diagnosis not present

## 2024-06-14 DIAGNOSIS — N2581 Secondary hyperparathyroidism of renal origin: Secondary | ICD-10-CM | POA: Diagnosis not present

## 2024-06-14 DIAGNOSIS — D649 Anemia, unspecified: Secondary | ICD-10-CM | POA: Diagnosis not present

## 2024-06-14 DIAGNOSIS — E1122 Type 2 diabetes mellitus with diabetic chronic kidney disease: Secondary | ICD-10-CM | POA: Diagnosis not present

## 2024-06-14 DIAGNOSIS — R1110 Vomiting, unspecified: Secondary | ICD-10-CM | POA: Diagnosis not present

## 2024-06-16 DIAGNOSIS — D649 Anemia, unspecified: Secondary | ICD-10-CM | POA: Diagnosis not present

## 2024-06-16 DIAGNOSIS — R110 Nausea: Secondary | ICD-10-CM | POA: Diagnosis not present

## 2024-06-16 DIAGNOSIS — E1122 Type 2 diabetes mellitus with diabetic chronic kidney disease: Secondary | ICD-10-CM | POA: Diagnosis not present

## 2024-06-16 DIAGNOSIS — N186 End stage renal disease: Secondary | ICD-10-CM | POA: Diagnosis not present

## 2024-06-16 DIAGNOSIS — Z992 Dependence on renal dialysis: Secondary | ICD-10-CM | POA: Diagnosis not present

## 2024-06-16 DIAGNOSIS — D631 Anemia in chronic kidney disease: Secondary | ICD-10-CM | POA: Diagnosis not present

## 2024-06-16 DIAGNOSIS — N2581 Secondary hyperparathyroidism of renal origin: Secondary | ICD-10-CM | POA: Diagnosis not present

## 2024-06-16 DIAGNOSIS — R1110 Vomiting, unspecified: Secondary | ICD-10-CM | POA: Diagnosis not present

## 2024-06-22 ENCOUNTER — Other Ambulatory Visit: Payer: Self-pay

## 2024-06-22 ENCOUNTER — Encounter (HOSPITAL_COMMUNITY)
Admission: RE | Disposition: A | Discharge status: Home / Self Care | Payer: Self-pay | Source: Home / Self Care | Attending: Vascular Surgery

## 2024-06-22 ENCOUNTER — Ambulatory Visit (HOSPITAL_COMMUNITY)
Admission: RE | Admit: 2024-06-22 | Discharge: 2024-06-22 | Disposition: A | Discharge status: Home / Self Care | Attending: Vascular Surgery | Admitting: Vascular Surgery

## 2024-06-22 DIAGNOSIS — I132 Hypertensive heart and chronic kidney disease with heart failure and with stage 5 chronic kidney disease, or end stage renal disease: Secondary | ICD-10-CM | POA: Diagnosis not present

## 2024-06-22 DIAGNOSIS — T82898A Other specified complication of vascular prosthetic devices, implants and grafts, initial encounter: Secondary | ICD-10-CM | POA: Diagnosis not present

## 2024-06-22 DIAGNOSIS — Z87891 Personal history of nicotine dependence: Secondary | ICD-10-CM | POA: Insufficient documentation

## 2024-06-22 DIAGNOSIS — Z992 Dependence on renal dialysis: Secondary | ICD-10-CM | POA: Diagnosis not present

## 2024-06-22 DIAGNOSIS — I5022 Chronic systolic (congestive) heart failure: Secondary | ICD-10-CM | POA: Insufficient documentation

## 2024-06-22 DIAGNOSIS — T82858A Stenosis of vascular prosthetic devices, implants and grafts, initial encounter: Secondary | ICD-10-CM | POA: Diagnosis not present

## 2024-06-22 DIAGNOSIS — N186 End stage renal disease: Secondary | ICD-10-CM | POA: Insufficient documentation

## 2024-06-22 DIAGNOSIS — Y832 Surgical operation with anastomosis, bypass or graft as the cause of abnormal reaction of the patient, or of later complication, without mention of misadventure at the time of the procedure: Secondary | ICD-10-CM | POA: Insufficient documentation

## 2024-06-22 HISTORY — PX: A/V FISTULAGRAM: CATH118298

## 2024-06-22 HISTORY — PX: A/V SHUNT INTERVENTION: CATH118220

## 2024-06-22 SURGERY — A/V FISTULAGRAM
Anesthesia: LOCAL | Site: Arm Upper

## 2024-06-22 MED ORDER — LIDOCAINE HCL (PF) 1 % IJ SOLN
INTRAMUSCULAR | Status: AC
  Filled 2024-06-22: qty 30

## 2024-06-22 MED ORDER — HEPARIN (PORCINE) IN NACL 1000-0.9 UT/500ML-% IV SOLN
INTRAVENOUS | Status: DC | PRN
  Administered 2024-06-22: 500 mL via SURGICAL_CAVITY

## 2024-06-22 MED ORDER — LIDOCAINE HCL (PF) 1 % IJ SOLN
INTRAMUSCULAR | Status: DC | PRN
  Administered 2024-06-22: 20 mL

## 2024-06-22 MED ORDER — IODIXANOL 320 MG/ML IV SOLN
INTRAVENOUS | Status: DC | PRN
  Administered 2024-06-22: 30 mL via INTRAVENOUS

## 2024-06-22 SURGICAL SUPPLY — 10 items
BALLOON MUSTANG 7X60X75 (BALLOONS) IMPLANT
KIT ENCORE 26 ADVANTAGE (KITS) IMPLANT
KIT MICROPUNCTURE NIT STIFF (SHEATH) IMPLANT
KIT PV (KITS) ×3 IMPLANT
SHEATH PINNACLE R/O II 6F 4CM (SHEATH) IMPLANT
SHEATH PROBE COVER 6X72 (BAG) IMPLANT
TRAY PV CATH (CUSTOM PROCEDURE TRAY) ×3 IMPLANT
TUBING CIL FLEX 10 FLL-RA (TUBING) IMPLANT
WIRE BENTSON .035X145CM (WIRE) IMPLANT
WIRE TORQFLEX AUST .018X40CM (WIRE) IMPLANT

## 2024-06-22 NOTE — Op Note (Signed)
 DATE OF SERVICE: 06/22/2024  PATIENT:  Audrey Hill  68 y.o. female  PRE-OPERATIVE DIAGNOSIS:  end-stage renal disease; recent infiltration  POST-OPERATIVE DIAGNOSIS:  Same  PROCEDURE:   1) Ultrasound guided left arm AVF access (CPT [phone]) 2) fistulagram with peripheral angioplasty (CPT [phone]) 3) established outpatient evaluation and management - level 3 (CPT 99213)  SURGEON:  Debby SAILOR. Magda, MD  ASSISTANT: none  ANESTHESIA:   local  ESTIMATED BLOOD LOSS: min  LOCAL MEDICATIONS USED:  LIDOCAINE    COUNTS: confirmed correct.  PATIENT DISPOSITION:  PACU - hemodynamically stable.   Delay start of Pharmacological VTE agent (>24hrs) due to surgical blood loss or risk of bleeding: no  INDICATION FOR PROCEDURE: Audrey Hill is a 68 y.o. female with ESRD, left arm AVF. After careful discussion of risks, benefits, and alternatives the patient was offered fistulagram. The patient understood and wished to proceed.  OPERATIVE FINDINGS:  Left Upper Extremity Central venous: no stenosis Subclavian vein: no stenosis Cephalic arch: no stenosis Fistula: stenosis in fistula beyond aneurysmal area >60% Anastomosis: no stenosis  DESCRIPTION OF PROCEDURE: After identification of the patient in the pre-operative holding area, the patient was transferred to the operating room. The patient was positioned supine on the operating room table.  The left upper extremity was prepped and draped in standard fashion. A surgical pause was performed confirming correct patient, procedure, and operative location.  The left upper extremity was anesthetized with subcutaneous injection of 1% lidocaine  over the area of planned access. Using ultrasound guidance, the left arm dialysis access was accessed with micropuncture technique.  Fistulogram was performed in stations with the micro sheath.  See above for details.  The decision was made to intervene. The lesion was crossed with a Donnel wire. The  lesion was angioplastied with a 7x43mm Mustang. Good result with angioplasty.  All endovascular equipment was removed.  A figure-of-eight stitch was applied to the exit site with good hemostasis.  Sterile bandage was applied.  Upon completion of the case instrument and sharps counts were confirmed correct. The patient was transferred to the PACU in good condition. I was present for all portions of the procedure.  PLAN: fistula remains amenable to percutaneous intervention. Follow up PRN.  Debby SAILOR. Magda, MD [facility] of [facility] Phone Number: [phone] 06/22/2024 12:56 PM
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
## 2024-06-23 DIAGNOSIS — D649 Anemia, unspecified: Secondary | ICD-10-CM | POA: Diagnosis not present

## 2024-06-25 ENCOUNTER — Encounter (HOSPITAL_COMMUNITY): Payer: Self-pay | Admitting: Vascular Surgery

## 2024-06-25 ENCOUNTER — Other Ambulatory Visit: Payer: Self-pay | Admitting: *Deleted
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
## 2024-06-25 NOTE — Patient Instructions (Signed)
 Visit Information  Thank you for taking time to visit with me today. Please don't hesitate to contact me if I can be of assistance to you before our next scheduled appointment.  Your next care management appointment is by telephone on 07/23/2024 at 1100  Please call the care guide team at [phone] if you need to cancel, schedule, or reschedule an appointment.   Please call the Suicide and Crisis Lifeline: 988 call the [place]  National Suicide Prevention Lifeline: 213-658-8921 or TTY: 732-371-1913 TTY (854)580-1900) to talk to a trained counselor call 1-800-273-TALK (toll free, 24 hour hotline) if you are experiencing a Mental Health or Behavioral Health Crisis or need someone to talk to.   Olam Ku, RN, BSN [place]  [facility], [facility] RN Care Manager Direct Dial : [phone]  Fax: [phone]

## 2024-06-26 ENCOUNTER — Telehealth: Payer: Self-pay

## 2024-06-26 DIAGNOSIS — N2581 Secondary hyperparathyroidism of renal origin: Secondary | ICD-10-CM | POA: Diagnosis not present

## 2024-06-26 DIAGNOSIS — E1122 Type 2 diabetes mellitus with diabetic chronic kidney disease: Secondary | ICD-10-CM | POA: Diagnosis not present

## 2024-06-26 DIAGNOSIS — N186 End stage renal disease: Secondary | ICD-10-CM | POA: Diagnosis not present

## 2024-06-26 DIAGNOSIS — R110 Nausea: Secondary | ICD-10-CM | POA: Diagnosis not present

## 2024-06-26 DIAGNOSIS — R1110 Vomiting, unspecified: Secondary | ICD-10-CM | POA: Diagnosis not present

## 2024-06-26 DIAGNOSIS — L299 Pruritus, unspecified: Secondary | ICD-10-CM | POA: Diagnosis not present

## 2024-06-26 DIAGNOSIS — D631 Anemia in chronic kidney disease: Secondary | ICD-10-CM | POA: Diagnosis not present

## 2024-06-26 DIAGNOSIS — Z992 Dependence on renal dialysis: Secondary | ICD-10-CM | POA: Diagnosis not present

## 2024-06-26 DIAGNOSIS — D649 Anemia, unspecified: Secondary | ICD-10-CM | POA: Diagnosis not present

## 2024-06-26 NOTE — Telephone Encounter (Addendum)
 COPD : RN Telephone Call   Patient ID: OLIMPIA TINCH, female    DOB: 1955-12-09  Age: 68 y.o. MRN: [id number]  Ms.MINDI AKERSON is a 68 y.o. who was called about COPD management.   Patient reports that they are taking the following medications for COPD:  ( ALBUTEROL  )  Patient reports they  DO NOT have a prior pulmonologist who they follow:  [facility]   Please document smoking status, how many packs? Ready to quit ?  - Smoking Hx: She has smoked.   mMRC (Modified Medical Research [employer]) Dyspnea Scale   Ask the following questions and please check the box that describes the most.   Choose ONE>   [x]  Grade 0: I only get breathless with strenuous exercise   [x]  Grade 1: I get short of breath when hurry on level ground or walking up a slight hill   [x]  Grade 2: On level Ground, I walk slower than people of the same age because of breathlessness or have to stop for breath when walking my own pace   [x]  Grade 3: I Stop for breath after walking about 100 yards or after a few minutes on level ground   [x]  Grade 4: I am too breathless to leave the house, or I am breathless when dressing    CAT (COPD Assessment Test)   Please ask each of the following questions and select between 0-5 based on what describes the patient most accurately.  On a scale 0 to 5, do you never cough or cough all the time: 2  2 3 2   0 (I am not limited doing any activities at home) 0 (I am confident leaving my home despite my lung condition) 5 (I don't sleep soundly because of my lung condition) On a scale 0 to 5, how would you describe your energy?: 5 (I have no energy at all)  Total: 19

## 2024-06-27 DIAGNOSIS — K6289 Other specified diseases of anus and rectum: Secondary | ICD-10-CM | POA: Diagnosis not present

## 2024-06-27 DIAGNOSIS — R100 Acute abdomen: Secondary | ICD-10-CM | POA: Diagnosis not present

## 2024-06-27 DIAGNOSIS — R918 Other nonspecific abnormal finding of lung field: Secondary | ICD-10-CM | POA: Diagnosis not present

## 2024-06-27 DIAGNOSIS — R1084 Generalized abdominal pain: Secondary | ICD-10-CM | POA: Diagnosis not present

## 2024-06-27 DIAGNOSIS — R509 Fever, unspecified: Secondary | ICD-10-CM | POA: Diagnosis not present

## 2024-06-27 DIAGNOSIS — R197 Diarrhea, unspecified: Secondary | ICD-10-CM | POA: Diagnosis not present

## 2024-06-27 DIAGNOSIS — R059 Cough, unspecified: Secondary | ICD-10-CM | POA: Diagnosis not present

## 2024-06-27 DIAGNOSIS — D649 Anemia, unspecified: Secondary | ICD-10-CM | POA: Diagnosis not present

## 2024-06-27 DIAGNOSIS — K5120 Ulcerative (chronic) proctitis without complications: Secondary | ICD-10-CM | POA: Diagnosis not present

## 2024-06-27 DIAGNOSIS — N186 End stage renal disease: Secondary | ICD-10-CM | POA: Diagnosis not present

## 2024-06-27 DIAGNOSIS — Z992 Dependence on renal dialysis: Secondary | ICD-10-CM | POA: Diagnosis not present

## 2024-06-28 ENCOUNTER — Emergency Department (HOSPITAL_COMMUNITY)

## 2024-06-28 ENCOUNTER — Encounter: Payer: Self-pay | Admitting: Student

## 2024-06-28 ENCOUNTER — Other Ambulatory Visit: Payer: Self-pay

## 2024-06-28 ENCOUNTER — Emergency Department (HOSPITAL_COMMUNITY)
Admission: EM | Admit: 2024-06-28 | Discharge: 2024-06-28 | Disposition: A | Discharge status: Home / Self Care | Attending: Emergency Medicine | Admitting: Emergency Medicine

## 2024-06-28 ENCOUNTER — Telehealth: Payer: Self-pay

## 2024-06-28 ENCOUNTER — Ambulatory Visit (INDEPENDENT_AMBULATORY_CARE_PROVIDER_SITE_OTHER): Payer: Self-pay | Admitting: Student

## 2024-06-28 ENCOUNTER — Encounter (HOSPITAL_COMMUNITY): Payer: Self-pay | Admitting: Emergency Medicine

## 2024-06-28 VITALS — BP 180/79 | HR 66 | Temp 98.1°F | Ht 66.0 in | Wt 146.8 lb

## 2024-06-28 DIAGNOSIS — R0602 Shortness of breath: Secondary | ICD-10-CM | POA: Diagnosis not present

## 2024-06-28 DIAGNOSIS — Z79899 Other long term (current) drug therapy: Secondary | ICD-10-CM | POA: Diagnosis not present

## 2024-06-28 DIAGNOSIS — Z13820 Encounter for screening for osteoporosis: Secondary | ICD-10-CM | POA: Diagnosis not present

## 2024-06-28 DIAGNOSIS — I499 Cardiac arrhythmia, unspecified: Secondary | ICD-10-CM | POA: Diagnosis not present

## 2024-06-28 DIAGNOSIS — H6591 Unspecified nonsuppurative otitis media, right ear: Secondary | ICD-10-CM | POA: Diagnosis not present

## 2024-06-28 DIAGNOSIS — G4709 Other insomnia: Secondary | ICD-10-CM | POA: Diagnosis not present

## 2024-06-28 DIAGNOSIS — R079 Chest pain, unspecified: Secondary | ICD-10-CM | POA: Diagnosis not present

## 2024-06-28 DIAGNOSIS — Z1231 Encounter for screening mammogram for malignant neoplasm of breast: Secondary | ICD-10-CM | POA: Diagnosis not present

## 2024-06-28 DIAGNOSIS — I517 Cardiomegaly: Secondary | ICD-10-CM | POA: Diagnosis not present

## 2024-06-28 DIAGNOSIS — R0789 Other chest pain: Secondary | ICD-10-CM | POA: Diagnosis not present

## 2024-06-28 DIAGNOSIS — K6289 Other specified diseases of anus and rectum: Secondary | ICD-10-CM | POA: Diagnosis not present

## 2024-06-28 DIAGNOSIS — B349 Viral infection, unspecified: Secondary | ICD-10-CM | POA: Insufficient documentation

## 2024-06-28 DIAGNOSIS — L299 Pruritus, unspecified: Secondary | ICD-10-CM | POA: Diagnosis not present

## 2024-06-28 DIAGNOSIS — J4520 Mild intermittent asthma, uncomplicated: Secondary | ICD-10-CM

## 2024-06-28 DIAGNOSIS — I10 Essential (primary) hypertension: Secondary | ICD-10-CM | POA: Diagnosis not present

## 2024-06-28 DIAGNOSIS — I491 Atrial premature depolarization: Secondary | ICD-10-CM | POA: Diagnosis not present

## 2024-06-28 DIAGNOSIS — Z743 Need for continuous supervision: Secondary | ICD-10-CM | POA: Diagnosis not present

## 2024-06-28 LAB — CBC
HCT: 34.2 % — ABNORMAL LOW (ref 36.0–46.0)
Hemoglobin: 10.7 g/dL — ABNORMAL LOW (ref 12.0–15.0)
MCH: 27.6 pg (ref 26.0–34.0)
MCHC: 31.3 g/dL (ref 30.0–36.0)
MCV: 88.4 fL (ref 80.0–100.0)
Platelets: 154 K/uL (ref 150–400)
RBC: 3.87 MIL/uL (ref 3.87–5.11)
RDW: 18.1 % — ABNORMAL HIGH (ref 11.5–15.5)
WBC: 3.9 K/uL — ABNORMAL LOW (ref 4.0–10.5)
nRBC: 0 % (ref 0.0–0.2)

## 2024-06-28 LAB — BASIC METABOLIC PANEL WITH GFR
Anion gap: 16 — ABNORMAL HIGH (ref 5–15)
BUN: 27 mg/dL — ABNORMAL HIGH (ref 8–23)
CO2: 25 mmol/L (ref 22–32)
Calcium: 8.5 mg/dL — ABNORMAL LOW (ref 8.9–10.3)
Chloride: 95 mmol/L — ABNORMAL LOW (ref 98–111)
Creatinine, Ser: 5.44 mg/dL — ABNORMAL HIGH (ref 0.44–1.00)
GFR, Estimated: 8 mL/min — ABNORMAL LOW (ref >60)
Glucose, Bld: 87 mg/dL (ref 70–99)
Potassium: 3.8 mmol/L (ref 3.5–5.1)
Sodium: 136 mmol/L (ref 135–145)

## 2024-06-28 LAB — TROPONIN I (HIGH SENSITIVITY)
Troponin I (High Sensitivity): 112 ng/L (ref <18)
Troponin I (High Sensitivity): 117 ng/L (ref <18)

## 2024-06-28 LAB — BRAIN NATRIURETIC PEPTIDE: B Natriuretic Peptide: 1151.2 pg/mL — ABNORMAL HIGH (ref 0.0–100.0)

## 2024-06-28 MED ORDER — MIRTAZAPINE 15 MG PO TABS: 15.0000 mg | ORAL_TABLET | Freq: Every day | ORAL | 2 refills | Status: DC

## 2024-06-28 MED ORDER — AMOXICILLIN-POT CLAVULANATE 500-125 MG PO TABS: 1.0000 | ORAL_TABLET | Freq: Two times a day (BID) | ORAL | 0 refills | Status: AC

## 2024-06-28 MED ORDER — OXYCODONE HCL 5 MG PO TABS
5.0000 mg | ORAL_TABLET | Freq: Once | ORAL | Status: AC
  Administered 2024-06-28: 5 mg via ORAL
  Filled 2024-06-28: qty 1

## 2024-06-28 MED ORDER — ACETAMINOPHEN 500 MG PO TABS
1000.0000 mg | ORAL_TABLET | Freq: Once | ORAL | Status: AC
  Administered 2024-06-28: 1000 mg via ORAL
  Filled 2024-06-28: qty 2

## 2024-06-28 MED ORDER — MIRTAZAPINE 15 MG PO TABS
15.0000 mg | ORAL_TABLET | Freq: Every day | ORAL | 2 refills | Status: DC
Start: 2024-06-28 — End: 2024-06-28

## 2024-06-28 NOTE — Progress Notes (Signed)
 Patient's PCP, Libby Blanch, DO requested pharmacy assistance in confirming medication change with patient's pill packing pharmacy.  Contacted [facility], who fills patient's pill packs. Reports that packs were delivered to patient on 06/23/24. Requested that pharmacy discontinue trazodone  and set mirtazapine  to fill in pill packs next month.   Mirtazapine  was sent to [facility] for patient to fill now. Left voicemail with patient with instructions to call back if she has questions/concerns about how to remove trazodone  from her current packs.   Lorain Baseman, PharmD [facility] [phone]

## 2024-06-28 NOTE — Assessment & Plan Note (Addendum)
 Patient endorses trazodone  is making her vomit.  Will stop trazodone .  Will start mirtazapine .  Plan: - Patient instructed to take mirtazapine  nightly 15 mg - Patient instructed to stop trazodone 

## 2024-06-28 NOTE — ED Triage Notes (Signed)
 PT BIB EMS from dialysis- completed 2 hours had sudden onset chest pain radiated to left arm and shoulder, also complains of SOB. Recently has required more fluid to be taken off. PT is allergic to nitroglycerin  and asa. EMS briefly tried CPAP pt could not tolerate.  182/100 Hr 72 100% room air

## 2024-06-28 NOTE — Assessment & Plan Note (Signed)
 Patient evaluated today.  Blood pressure today elevated into the 170s.  Patient has dialysis today.  Patient was previously on Coreg , but recently stopped in the setting of hypotension.  Anticipate fluid shifts with dialysis today.  Will not titrate blood pressure medicines today.  Plan: - Continue losartan  50 mg daily - Continue isosorbide  dinitrate 20 mg 3 times daily - Continue hydralazine  50 mg 3 times daily

## 2024-06-28 NOTE — ED Provider Triage Note (Signed)
 Emergency Medicine Provider Triage Evaluation Note  Audrey Hill , a 68 y.o. female  was evaluated in triage.  Pt complains of sudden onset chest pain and shortness of breath rating to the left shoulder, history of end-stage renal disease.  She has a history of pulmonary hypertension.  She is allergic to nitroglycerin  and aspirin .  Review of Systems  Positive:  Negative:   Physical Exam  BP (!) 145/74   Pulse 71   Temp (!) 97.5 F (36.4 C)   Resp 18   Wt 66.2 kg   SpO2 100%   BMI 23.57 kg/m  Gen:   Awake, no distress   Resp:  Normal effort  MSK:   Moves extremities without difficulty  Other:  Complains of chest pain  Medical Decision Making  Medically screening exam initiated at 3:23 PM.  Appropriate orders placed.  Audrey Hill was informed that the remainder of the evaluation will be completed by another provider, this initial triage assessment does not replace that evaluation, and the importance of remaining in the ED until their evaluation is complete.     Arloa Chroman, PA-C 06/28/24 1531
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
## 2024-06-28 NOTE — ED Notes (Signed)
 Pt [facility] is enroute to transport patient home. ETA 30 mins.

## 2024-06-28 NOTE — ED Provider Notes (Signed)
 [place] EMERGENCY DEPARTMENT AT [facility] Provider Note   CSN: [id number] Arrival date & time: 06/28/24  1406     Patient presents with: Chest Pain   Audrey Hill is a 68 y.o. female.   68 yo F with a chief complaints of chest discomfort.  This occurred while she was getting dialysis today.  Says she got about 2 hours of her dialysis done and then asked them to stop.  She is feeling a bit better now.  Tells me that she has been coughing and congested stomach has been upset.  She tells me she saw her family doctor recently and they told her she had some fluid in her ears.   Chest Pain      Prior to Admission medications   Medication Sig Start Date End Date Taking? Authorizing Provider  albuterol  (VENTOLIN  HFA) 108 (90 Base) MCG/ACT inhaler INHALE 2 PUFFS BY MOUTH EVERY 6 HOURS AS NEEDED FOR COUGH 03/13/24   [name], DO  amiodarone  (PACERONE ) 200 MG tablet Take 1 tablet (200 mg total) by mouth daily. 11/11/23   [name], DO  amoxicillin -clavulanate (AUGMENTIN ) 500-125 MG tablet Take 1 tablet by mouth 2 (two) times daily for 5 days. 06/28/24 07/03/24  [name], DO  atorvastatin  (LIPITOR) 40 MG tablet Take 1 tablet (40 mg total) by mouth daily. 12/16/23   [name], DO  diclofenac  Sodium (VOLTAREN ) 1 % GEL Apply 4 g topically 4 (four) times daily. 12/09/23   Amoako, Prince, MD  ferrous sulfate 325 (65 FE) MG tablet Take 325 mg by mouth every other day.    [provider]  furosemide  (LASIX ) 80 MG tablet Take 1 tablet (80 mg total) by mouth daily. 05/29/24 08/27/24  [name], DO  hydrALAZINE  (APRESOLINE ) 50 MG tablet Take 1 tablet (50 mg total) by mouth 3 (three) times daily. 05/29/24   [name], DO  HYDROmorphone  (DILAUDID ) 2 MG tablet Take 1 tablet (2 mg total) by mouth every 12 (twelve) hours as needed for severe pain (pain score 7-10). 05/12/24   Rosan Dayton BROCKS, DO  HYDROmorphone  (DILAUDID ) 2 MG tablet Take 1 tablet (2 mg total) by mouth every 12  (twelve) hours as needed for severe pain (pain score 7-10). 06/11/24   Rosan Dayton BROCKS, DO  HYDROmorphone  (DILAUDID ) 2 MG tablet Take 1 tablet (2 mg total) by mouth every 12 (twelve) hours as needed for severe pain (pain score 7-10). 07/11/24   Rosan Dayton BROCKS, DO  isosorbide  dinitrate (ISORDIL ) 20 MG tablet TAKE 1 TABLET (20 MG TOTAL) BY MOUTH 3 (THREE) TIMES DAILY. 04/12/24   Rosan Dayton BROCKS, DO  lidocaine  (LIDODERM ) 5 % Place 1 patch onto the skin daily as needed. Remove & Discard patch within 12 hours or as directed by MD 05/14/24   [name], DO  losartan  (COZAAR ) 50 MG tablet Take 1 tablet (50 mg total) by mouth daily. 11/11/23 11/10/24  [name], DO  Methoxy PEG-Epoetin Beta (MIRCERA IJ) 100 mcg. 04/17/24 04/16/25  [provider]  mirtazapine  (REMERON ) 15 MG tablet Take 1 tablet (15 mg total) by mouth at bedtime. 06/28/24 06/28/25  [name], DO  multivitamin (RENA-VIT) TABS tablet Take 1 tablet by mouth daily. 01/27/24   [name], DO  pantoprazole  (PROTONIX ) 40 MG tablet TAKE 1 TABLET BY MOUTH EVERY DAY 06/12/24   [name], DO  polyethylene glycol (MIRALAX  / GLYCOLAX ) 17 g packet Take 17 g by mouth daily.    [provider]  senna (  SENOKOT) 8.6 MG TABS tablet Take 2 tablets by mouth daily as needed for mild constipation. 12/19/23   [provider]  sertraline  (ZOLOFT ) 25 MG tablet Take 1 tablet (25 mg total) by mouth daily. 02/06/24   [name], DO  sevelamer  carbonate (RENVELA ) 800 MG tablet Take 1 tablet (800 mg total) by mouth 3 (three) times daily with meals. 02/09/24   [name], DO  triamcinolone (KENALOG) 0.025 % ointment Apply 1 Application topically daily as needed (shin). 12/13/23   [provider]    Allergies: Diphenhydramine  hcl, Lisinopril, Metoclopramide , Nortriptyline, Tramadol, Nitroglycerin , Tylenol  [acetaminophen ], Ibuprofen, Norvasc  [amlodipine  besylate], and Nsaids    Review of Systems  Cardiovascular:  Positive for chest pain.     Updated Vital Signs BP (!) 169/79   Pulse 72   Temp 98.1 F (36.7 C) (Oral)   Resp 18   Wt 66.2 kg   SpO2 92%   BMI 23.57 kg/m   Physical Exam Vitals and nursing note reviewed.  Constitutional:      General: She is not in acute distress.    Appearance: She is well-developed. She is not diaphoretic.  HENT:     Head: Normocephalic and atraumatic.  Eyes:     Pupils: Pupils are equal, round, and reactive to light.  Cardiovascular:     Rate and Rhythm: Normal rate and regular rhythm.     Heart sounds: No murmur heard.    No friction rub. No gallop.  Pulmonary:     Effort: Pulmonary effort is normal.     Breath sounds: No wheezing or rales.  Abdominal:     General: There is no distension.     Palpations: Abdomen is soft.     Tenderness: There is no abdominal tenderness.  Musculoskeletal:        General: No tenderness.     Cervical back: Normal range of motion and neck supple.  Skin:    General: Skin is warm and dry.  Neurological:     Mental Status: She is alert and oriented to person, place, and time.  Psychiatric:        Behavior: Behavior normal.     (all labs ordered are listed, but only abnormal results are displayed) Labs Reviewed  BASIC METABOLIC PANEL WITH GFR - Abnormal; Notable for the following components:      Result Value   Chloride 95 (*)    BUN 27 (*)    Creatinine, Ser 5.44 (*)    Calcium  8.5 (*)    GFR, Estimated 8 (*)    Anion gap 16 (*)    All other components within normal limits  CBC - Abnormal; Notable for the following components:   WBC 3.9 (*)    Hemoglobin 10.7 (*)    HCT 34.2 (*)    RDW 18.1 (*)    All other components within normal limits  BRAIN NATRIURETIC PEPTIDE - Abnormal; Notable for the following components:   B Natriuretic Peptide 1,151.2 (*)    All other components within normal limits  TROPONIN I (HIGH SENSITIVITY) - Abnormal; Notable for the following components:   Troponin I (High Sensitivity) 112 (*)    All other  components within normal limits  TROPONIN I (HIGH SENSITIVITY) - Abnormal; Notable for the following components:   Troponin I (High Sensitivity) 117 (*)    All other components within normal limits    EKG: EKG Interpretation Date/Time:  Thursday June 28 2024 14:37:37 EDT Ventricular Rate:  73 PR Interval:  156 QRS Duration:  126 QT Interval:  450 QTC Calculation: 495 R Axis:   -57  Text Interpretation: Normal sinus rhythm Possible Left atrial enlargement Left axis deviation Left ventricular hypertrophy with QRS widening and repolarization abnormality ( R in aVL , Cornell product ) Abnormal ECG No significant change since last tracing Confirmed by Emil Share [id number]) on 06/28/2024 4:56:32 PM  Radiology: DG Chest 2 View Result Date: 06/28/2024 EXAM: 2 VIEW(S) XRAY OF THE CHEST 06/28/2024 02:53:55 PM COMPARISON: 06/09/2024 CLINICAL HISTORY: chest pain. Per triage notes: PT BIB EMS from dialysis- completed 2 hours had sudden onset chest pain radiated to left arm and shoulder, also complains of SOB. Recently has required more fluid to be taken off. PT is allergic to nitroglycerin  and asa. EMS briefly ; tried CPAP pt could not tolerate.  FINDINGS: LUNGS AND PLEURA: No focal pulmonary opacity. No pulmonary edema. No pleural effusion. No pneumothorax. HEART AND MEDIASTINUM: Cardiomegaly. Central vascular prominence. BONES AND SOFT TISSUES: Old healed right rib fracture. IMPRESSION: 1. Cardiomegaly with central vascular prominence. Electronically signed by: Waddell Calk MD 06/28/2024 03:15 PM EDT RP Workstation: [id number]     Procedures   Medications Ordered in the ED  oxyCODONE  (Oxy IR/ROXICODONE ) immediate release tablet 5 mg (has no administration in time range)  acetaminophen  (TYLENOL ) tablet 1,000 mg (1,000 mg Oral Given 06/28/24 1714)  oxyCODONE  (Oxy IR/ROXICODONE ) immediate release tablet 5 mg (5 mg Oral Given 06/28/24 1715)                                    Medical Decision  Making Amount and/or Complexity of Data Reviewed Labs: ordered. Radiology: ordered.  Risk OTC drugs. Prescription drug management.   68 yo F with what sounds like a viral syndrome cough congestion myalgias stomach upset.  Going on for a few days now.  Apparently had sudden onset chest discomfort while getting dialysis today.  Initial troponin appears to be at her baseline.  Will obtain a delta.  No significant change in troponin.  Patient reassessed and continues to have allover body aches.  I think most likely consistent with viral syndrome.  Will have the patient follow-up with her PCP in clinic.  Follow-up with dialysis as scheduled.  6:25 PM:  I have discussed the diagnosis/risks/treatment options with the patient.  Evaluation and diagnostic testing in the emergency department does not suggest an emergent condition requiring admission or immediate intervention beyond what has been performed at this time.  They will follow up with PCP. We also discussed returning to the ED immediately if new or worsening sx occur. We discussed the sx which are most concerning (e.g., sudden worsening pain, fever, inability to tolerate by mouth) that necessitate immediate return. Medications administered to the patient during their visit and any new prescriptions provided to the patient are listed below.  Medications given during this visit Medications  oxyCODONE  (Oxy IR/ROXICODONE ) immediate release tablet 5 mg (has no administration in time range)  acetaminophen  (TYLENOL ) tablet 1,000 mg (1,000 mg Oral Given 06/28/24 1714)  oxyCODONE  (Oxy IR/ROXICODONE ) immediate release tablet 5 mg (5 mg Oral Given 06/28/24 1715)     The patient appears reasonably screen and/or stabilized for discharge and I doubt any other medical condition or other [facility] requiring further screening, evaluation, or treatment in the ED at this time prior to discharge.       Final diagnoses:  Nonspecific chest pain  Viral syndrome  ED Discharge Orders     None          Emil Share, [place] 06/28/24 1825

## 2024-06-28 NOTE — Assessment & Plan Note (Addendum)
 Patient does have a history of reactive airway disease.  Unclear if asthma or not.  Need to schedule for PFTs.  Patient has been referred in the past.  Scheduled them today for July 06, 2024 at 10 AM.  Did reach out to patient and she does have some disease burden.  Plan: - Follow-up PFT - Once reactive airway disease is identified, can appropriately manage

## 2024-06-28 NOTE — Discharge Instructions (Signed)
 Please call your doctor tomorrow to let them know how you are doing.  Please continue to get dialysis as scheduled.  Please return for worsening symptoms.

## 2024-06-28 NOTE — Patient Instructions (Signed)
 Audrey Hill,Thank you for allowing me to take part in your care today.  Here are your instructions.  1.  Stop taking your trazodone .  I have written for a new medication called mirtazapine .  Please take this at night.  2.  I have written you for antibiotic.  This is a 5-day course.  Take it twice daily for 5 days.  3.  Please come back in 1 month.  At that time we can follow-up on your chronic conditions.  We can also give your flu vaccine.  4.  I am helping schedule your mammogram and DEXA scan.  Your pulmonary function test has been scheduled for October 3 at 10 AM.  2.  Hears your med list.  A-fib: Amiodarone  200 mg  Hyperlipidemia: Lipitor 40 mg daily  Constipation:  MiraLAX  17 mg twice daily, senna 2 mg daily ESRD: Calcium  acetate 3 times daily, sevelamer  800 mg 3 times daily, Renavite tabs HFrEF: Lasix  80 mg daily Pain:  Voltaren  gel, lidocaine  patch, Dilaudid  2 mg every 12 hours as needed Iron  deficiency anemia: Ferrous sulfate 325 mg every other day, Mircera  Hypertension: Hydralazine  50 mg 3 times daily, isosorbide  dinitrate 20 mg 3 times daily, losartan  50 mg daily Chronic pain: Dilaudid  2 mg every 8 hours as needed Insomnia: Mirtazapine  15 mg nightly GERD: Protonix  40 mg daily Depression: Zoloft  25 mg daily Hyperlipidemia: Lipitor 20 mg daily Reactive airway disease: Albuterol  Ear Infection: Take Augmentin  twice daily for 5 days  PLEASE BRING YOUR MEDICATIONS TO EVERY APPOINTMENT  Thank you, Dr. Tobie  If you have any other questions please contact the internal medicine clinic at [phone] If it is after hours, please call the [facility] at [phone] and then ask the person who picks up for the resident on call.

## 2024-06-29 NOTE — Progress Notes (Signed)
 Internal Medicine Clinic Attending  I was physically present during the key portions of the resident provided service and participated in the medical decision making of patient's management care. I reviewed pertinent patient test results.  The assessment, diagnosis, and plan were formulated together and I agree with the documentation in the resident's note.  Jeanelle Layman CROME, MD

## 2024-06-30 DIAGNOSIS — L299 Pruritus, unspecified: Secondary | ICD-10-CM | POA: Diagnosis not present

## 2024-06-30 DIAGNOSIS — Z992 Dependence on renal dialysis: Secondary | ICD-10-CM | POA: Diagnosis not present

## 2024-06-30 DIAGNOSIS — R1110 Vomiting, unspecified: Secondary | ICD-10-CM | POA: Diagnosis not present

## 2024-06-30 DIAGNOSIS — D631 Anemia in chronic kidney disease: Secondary | ICD-10-CM | POA: Diagnosis not present

## 2024-06-30 DIAGNOSIS — D649 Anemia, unspecified: Secondary | ICD-10-CM | POA: Diagnosis not present

## 2024-06-30 DIAGNOSIS — E1122 Type 2 diabetes mellitus with diabetic chronic kidney disease: Secondary | ICD-10-CM | POA: Diagnosis not present

## 2024-06-30 DIAGNOSIS — R110 Nausea: Secondary | ICD-10-CM | POA: Diagnosis not present

## 2024-06-30 DIAGNOSIS — N2581 Secondary hyperparathyroidism of renal origin: Secondary | ICD-10-CM | POA: Diagnosis not present

## 2024-07-01 DIAGNOSIS — N186 End stage renal disease: Secondary | ICD-10-CM | POA: Diagnosis not present

## 2024-07-03 ENCOUNTER — Telehealth: Payer: Self-pay | Admitting: *Deleted

## 2024-07-03 DIAGNOSIS — N186 End stage renal disease: Secondary | ICD-10-CM | POA: Diagnosis not present

## 2024-07-03 DIAGNOSIS — I129 Hypertensive chronic kidney disease with stage 1 through stage 4 chronic kidney disease, or unspecified chronic kidney disease: Secondary | ICD-10-CM | POA: Diagnosis not present

## 2024-07-03 DIAGNOSIS — Z992 Dependence on renal dialysis: Secondary | ICD-10-CM | POA: Diagnosis not present

## 2024-07-03 NOTE — Telephone Encounter (Signed)
 Mammogram appointment October 29.2025 @ 8:50 am to arrive 8:30 am breast center- [phone] / appointment mailed to patient with information regarding the 75.00 no show fee. Patient contacted with this appointment.

## 2024-07-04 DIAGNOSIS — R1111 Vomiting without nausea: Secondary | ICD-10-CM | POA: Diagnosis not present

## 2024-07-04 DIAGNOSIS — I130 Hypertensive heart and chronic kidney disease with heart failure and stage 1 through stage 4 chronic kidney disease, or unspecified chronic kidney disease: Secondary | ICD-10-CM | POA: Diagnosis not present

## 2024-07-04 DIAGNOSIS — R071 Chest pain on breathing: Secondary | ICD-10-CM | POA: Diagnosis not present

## 2024-07-04 DIAGNOSIS — I160 Hypertensive urgency: Secondary | ICD-10-CM | POA: Diagnosis not present

## 2024-07-04 DIAGNOSIS — I517 Cardiomegaly: Secondary | ICD-10-CM | POA: Diagnosis not present

## 2024-07-04 DIAGNOSIS — I5020 Unspecified systolic (congestive) heart failure: Secondary | ICD-10-CM | POA: Diagnosis not present

## 2024-07-04 DIAGNOSIS — I110 Hypertensive heart disease with heart failure: Secondary | ICD-10-CM | POA: Diagnosis not present

## 2024-07-04 DIAGNOSIS — Z79891 Long term (current) use of opiate analgesic: Secondary | ICD-10-CM | POA: Diagnosis not present

## 2024-07-04 DIAGNOSIS — N186 End stage renal disease: Secondary | ICD-10-CM | POA: Diagnosis not present

## 2024-07-04 DIAGNOSIS — Z86718 Personal history of other venous thrombosis and embolism: Secondary | ICD-10-CM | POA: Diagnosis not present

## 2024-07-04 DIAGNOSIS — I120 Hypertensive chronic kidney disease with stage 5 chronic kidney disease or end stage renal disease: Secondary | ICD-10-CM | POA: Diagnosis not present

## 2024-07-04 DIAGNOSIS — J4489 Other specified chronic obstructive pulmonary disease: Secondary | ICD-10-CM | POA: Diagnosis not present

## 2024-07-04 DIAGNOSIS — R079 Chest pain, unspecified: Secondary | ICD-10-CM | POA: Diagnosis not present

## 2024-07-04 DIAGNOSIS — I132 Hypertensive heart and chronic kidney disease with heart failure and with stage 5 chronic kidney disease, or end stage renal disease: Secondary | ICD-10-CM | POA: Diagnosis not present

## 2024-07-04 DIAGNOSIS — I519 Heart disease, unspecified: Secondary | ICD-10-CM | POA: Diagnosis not present

## 2024-07-04 DIAGNOSIS — R059 Cough, unspecified: Secondary | ICD-10-CM | POA: Diagnosis not present

## 2024-07-04 DIAGNOSIS — D638 Anemia in other chronic diseases classified elsewhere: Secondary | ICD-10-CM | POA: Diagnosis not present

## 2024-07-04 DIAGNOSIS — E785 Hyperlipidemia, unspecified: Secondary | ICD-10-CM | POA: Diagnosis not present

## 2024-07-04 DIAGNOSIS — G8929 Other chronic pain: Secondary | ICD-10-CM | POA: Diagnosis not present

## 2024-07-04 DIAGNOSIS — Z8673 Personal history of transient ischemic attack (TIA), and cerebral infarction without residual deficits: Secondary | ICD-10-CM | POA: Diagnosis not present

## 2024-07-04 DIAGNOSIS — D631 Anemia in chronic kidney disease: Secondary | ICD-10-CM | POA: Diagnosis not present

## 2024-07-04 DIAGNOSIS — I5023 Acute on chronic systolic (congestive) heart failure: Secondary | ICD-10-CM | POA: Diagnosis not present

## 2024-07-04 DIAGNOSIS — R0789 Other chest pain: Secondary | ICD-10-CM | POA: Diagnosis not present

## 2024-07-04 DIAGNOSIS — D696 Thrombocytopenia, unspecified: Secondary | ICD-10-CM | POA: Diagnosis not present

## 2024-07-04 DIAGNOSIS — D61818 Other pancytopenia: Secondary | ICD-10-CM | POA: Diagnosis not present

## 2024-07-04 DIAGNOSIS — R0602 Shortness of breath: Secondary | ICD-10-CM | POA: Diagnosis not present

## 2024-07-04 DIAGNOSIS — I071 Rheumatic tricuspid insufficiency: Secondary | ICD-10-CM | POA: Diagnosis not present

## 2024-07-04 DIAGNOSIS — R0981 Nasal congestion: Secondary | ICD-10-CM | POA: Diagnosis not present

## 2024-07-04 DIAGNOSIS — J9612 Chronic respiratory failure with hypercapnia: Secondary | ICD-10-CM | POA: Diagnosis not present

## 2024-07-04 DIAGNOSIS — I480 Paroxysmal atrial fibrillation: Secondary | ICD-10-CM | POA: Diagnosis not present

## 2024-07-04 DIAGNOSIS — E8770 Fluid overload, unspecified: Secondary | ICD-10-CM | POA: Diagnosis not present

## 2024-07-04 DIAGNOSIS — Z992 Dependence on renal dialysis: Secondary | ICD-10-CM | POA: Diagnosis not present

## 2024-07-04 DIAGNOSIS — R778 Other specified abnormalities of plasma proteins: Secondary | ICD-10-CM | POA: Diagnosis not present

## 2024-07-04 DIAGNOSIS — E1122 Type 2 diabetes mellitus with diabetic chronic kidney disease: Secondary | ICD-10-CM | POA: Diagnosis not present

## 2024-07-04 DIAGNOSIS — R062 Wheezing: Secondary | ICD-10-CM | POA: Diagnosis not present

## 2024-07-04 DIAGNOSIS — Z87891 Personal history of nicotine dependence: Secondary | ICD-10-CM | POA: Diagnosis not present

## 2024-07-04 DIAGNOSIS — I5189 Other ill-defined heart diseases: Secondary | ICD-10-CM | POA: Diagnosis not present

## 2024-07-04 DIAGNOSIS — I454 Nonspecific intraventricular block: Secondary | ICD-10-CM | POA: Diagnosis not present

## 2024-07-04 DIAGNOSIS — R110 Nausea: Secondary | ICD-10-CM | POA: Diagnosis not present

## 2024-07-04 DIAGNOSIS — I252 Old myocardial infarction: Secondary | ICD-10-CM | POA: Diagnosis not present

## 2024-07-04 NOTE — ED Triage Notes (Signed)
 Pt coming from home with complaints of cough and congestion that started 3 days ago, chest pain and SHOB that started last night and nausea vomiting and diarrhea that started early this morning. Pt is dialysis pt Tues, Thurs, Sat.

## 2024-07-05 DIAGNOSIS — R079 Chest pain, unspecified: Secondary | ICD-10-CM | POA: Diagnosis not present

## 2024-07-06 ENCOUNTER — Inpatient Hospital Stay (HOSPITAL_COMMUNITY): Admission: RE | Admit: 2024-07-06 | Source: Ambulatory Visit

## 2024-07-06 NOTE — Discharge Summary (Signed)
 ------------------------------------------------------------------------------- Attestation signed by Glo Stanly Oris, MD at 07/07/2024  3:56 PM Discharge plan as mentioned below by Herlene Nian, NP.   I have not seen or examined the patient on the day of discharge but was available if NP had any questions.  Patient was cleared by nephrology, she had dialysis on the day of discharge she will resume the outpatient dialysis.  -------------------------------------------------------------------------------    Hospital Medicine Discharge Summary   Demographics: Audrey Hill  68 y.o. 07-05-1956 MRN: [id number]    Extended Emergency Contact Information Primary Emergency Contact: Blackman,Pansy Mobile Phone: [phone] Relation: Daughter  Full Code  Admit Date: 07/04/2024                            Attending Physician: Glo Stanly Oris, MD Discharge Date: 07/06/2024  Primary Care Provider: [place] Sherlean Eastern, MD   [phone]  Consults during this admission: Consult Orders             IP CONSULT TO NEPHROLOGY       Specialty:  Nephrology  Provider:  (Not yet assigned)      IP CONSULT TO HOSPITALIST       Provider:  (Not yet assigned)             Active & Resolved Diagnosis: Principal Problem:   SOB (shortness of breath) Active Problems:   History of stroke   Hyperlipidemia   Chronic respiratory failure with hypercapnia (HCC)   Anemia   MDD (major depressive disorder)   Type 2 diabetes mellitus with diabetic chronic kidney disease (HCC)   ESRD (end stage renal disease) on dialysis Resolved Problems:   * No resolved hospital problems. *  Disposition: Patient discharged to Home in stable condition.  Discharge follow-up recommendations : Other: all workup in hospital (-). Fluid overload treated with dialysis including day of discharge she received dialysis with no complications. Stable for OP dialysis return tomorrow per nephrology. No other  inpatient needs- safe to discharge.    Hospital Course: Audrey Hill is a 68 y.o. female with extensive PMHx as below, who presents to ED with 3 days of malaise and URI symptoms.   Pt fatigued, AAOx4. Reports 3 days of malaise, nonproductive cough, sneezing, congestion, pleuritic CP, pressure in ears, diarrhea.  Denies overt CP or palps.  Pleuritic pain lower chest bilaterally.  Lives at home with grandson, who is not sick with similar symptoms, but notes multiple exposures to other dialysis patients coughing.  Also notes mild orthopnea and feels more comfortable breathing sitting on incline.  Ongoing adequate p.o. intake/hydration.  Attempts to follow dialysis diet.  Does not check FSBS at home.  No missed HD sessions; last 9/30. Has chronic pain and takes opiates via pain clinic. Feels back/neck pain slightly worse these past few days. Notes highest stress/anxiety recently. Compliant with meds.  Former heavy smoker (quit early 2000's).  Denies: EtOH, illicits.  Full code.   ED course: Temp 99.1, BP elevated 180-190s/80-90s, on 3L O2. Labs: BNP 2560, trop 62, CR 6.06, no leukocytosis, Hgb 10.2, PLT 120, negative: COVID/RSV/flu.  CXR cardiomegaly with pulm edema.  EKG SR, QTc 512.  Received IV morphine , IV Zofran .  No teams consulted. Assessment & Plan SOB (shortness of breath) Cough, congestion, pleuritic chest pain Chronic hypoxia on 3 L oxygen at home Suspected URI.  Ruled out with negative RSV panel and COVID RSV and flu negative. Suspect due to pulmonary edema, evident on imaging and  elevated BNP. Improved with receiving hemodialysis on 10/1. Monitoring as low blood pressure this morning, has improved to normotensive Discharge after dialysis today Chronic respiratory failure with hypercapnia (HCC) On 2 to 3 L oxygen at home. ESRD (end stage renal disease) on dialysis ESRD on HD TTS Anemia of chronic disease Receives HD in [place], did not  missed dialysis, last HD on 9/30  prior to admission Nephrologist following, HD on 10/1 3 L removed, OP dialysis return tomorrow   Elevated troponin Chronically elevated troponin for past 2 years.  History of HFpEF history of HFrEF EF 35 to 40% on TTE 10/2023 Repeat TTE with improved EF 40 to 45%. Continues on p.o. Lasix   Essential hypertension Continue home antihypertensive regimen with appropriate hold parameters in palce     10/03 1040 Hemodialysis One time Dialysis 3 Hours (180 Minutes); Dialysis; Optiflux 200NR  SA 1.9/ UF74/ prime volume 113; Saline; 2 K / 2.5 Ca (Standard); 137 mEq/L; 37 mEq/L; 36C; 300 mL/min; 350 mL/min; 300 mL/min; 61.5kg; Dialyze to dry weight; Arteriove...      Wound / Incision Assessment: Refer to Chart Review and Media Tab for images if available.  Wound 03/26/24 Procedural puncture Hip Anterior;Bilateral (Active)  Date First Assessed/Time First Assessed: 03/26/24 0733   Pre-Existing Wound: No  Primary Wound Type: Procedural puncture  Location: Hip  Wound Location Orientation: Anterior;Bilateral    Vital Sign Range:  Temp:  [97.6 F (36.4 C)-99.1 F (37.3 C)] 98.1 F (36.7 C) Heart Rate:  [59-70] 59 Resp:  [16-18] 16 BP: (108-161)/(56-73) 157/73      Discharge Medications     Medications To Continue      Sig Disp Refill Start End  albuterol  HFA 90 mcg/actuation inhaler Commonly known as: PROVENTIL  HFA;VENTOLIN  HFA;PROAIR  HFA  Inhale 2 puffs every 6 (six) hours as needed for wheezing.  8 g  1     amiodarone  200 mg tablet Commonly known as: PACERONE   Take 1 tablet (200 mg total) by mouth daily.  30 tablet  2     atorvastatin  40 mg tablet Commonly known as: LIPITOR  Take 40 mg by mouth daily.   0     calcium  acetate 667 mg Tab tablet Commonly known as: CALIPHRON  TAKE 1 TABLET BY MOUTH THREE TIMES DAILY WITH MEALS AND ONE WITH A SNACK ONCE DAILY   0     diclofenac  sodium 1 % gel Commonly known as: VOLTAREN   Apply to left shoulder and neck  2 g  0      ferrous sulfate 325 mg (65 mg iron ) tablet  Take 1 tablet (325 mg total) by mouth every other day.  45 tablet  0     furosemide  80 mg tablet Commonly known as: LASIX     0     hydrALAZINE  25 mg tablet Commonly known as: APRESOLINE   Take 1 tablet (25 mg total) by mouth 3 (three) times a day.  90 tablet  2     HYDROmorphone  2 mg tablet Commonly known as: DILAUDID   Take 2 mg by mouth.   0     isosorbide  dinitrate 20 mg tablet Commonly known as: ISORDIL   Take 1 tablet (20 mg total) by mouth 3 (three) times a day.  90 tablet  2     Lidoderm  5 % patch Generic drug: lidocaine   Place 1 patch on the skin.   0     losartan  50 mg tablet Commonly known as: COZAAR   Take 1 tablet (50 mg total) by  mouth daily.  30 tablet  2     pantoprazole  40 mg EC tablet Commonly known as: PROTONIX   Take 1 tablet (40 mg total) by mouth every morning before breakfast.  30 tablet  1     polyethylene glycol 17 gram packet Commonly known as: GLYCOLAX   Take 17 g by mouth daily as needed.   0     Rena-Vite 0.8 mg Tab tablet Generic drug: B complex-vitamin C-folic acid   Take 1 tablet by mouth nightly.   0     senna 8.6 mg tablet Commonly known as: SENOKOT  Take 2 tablets by mouth.   0     sertraline  25 mg tablet Commonly known as: ZOLOFT   Take 1 tablet (25 mg total) by mouth daily.  30 tablet  0     sevelamer  carbonate 800 mg tablet Commonly known as: RENVELA   take 2 tablets by mouth three times daily with meals   0     traZODone  50 mg tablet Commonly known as: DESYREL   Take 50 mg by mouth.   0         Discharge Orders     Ambulatory referral to PCP     Full Code     Lifting Limits:     Details:    Lifting Limits: No lifting limits         Lab Results  Component Value Date/Time   HGB 9.2 (L) 07/06/2024 01:36 AM   HCT 27.3 (L) 07/06/2024 01:36 AM   WBC 3.81 (L) 07/06/2024 01:36 AM   PLT 113 (L) 07/06/2024 01:36 AM   Lab Results  Component Value Date/Time    NA 130 (L) 07/06/2024 01:36 AM   K 5.1 (H) 07/06/2024 01:36 AM   CREATININE 7.64 (H) 07/06/2024 01:36 AM   BUN 36 (H) 07/06/2024 01:36 AM   GLUCOSE 92 07/06/2024 01:36 AM    Pertinent Imaging: Transthoracic echo (TTE) complete  Final Result by Erna Creighton, MD (10/02 1207)                                                    [facility]                                                  [facility]                                                  [facility]  Cardiac                                                  Ultrasound-                                                  [place],                                                 [facility]                                                     Bldg                                                   [address]                                                  [place]                                                   [place] [address]                                   Transthoracic Echocardiogram Report  Name  NEALA, MIGGINS [facility]                 Study Date  07-05-2024                 Height  66 in  MRN  [id number]                                   Patient Location    [facility]         Weight  136 lb  DOB  [birth year]                                 Gender  Female  BSA  1.7 m2  Age  19 yrs                                     Ethnicity  3                           BP  132-64 mmHg  Reason For Study  fluid overload                                                       HR  77  History  Fluid overload  Ordering Physician  JULES, SARAH MICHELE        Performed By  CORRINE RIGGS  Referring Physician  JULES, SARAH MICHELE  -  -  PROCEDURE  A two-dimensional transthoracic echocardiogram  with color flow and Doppler   was performed.  -  SUMMARY  Volume status much better compared with Jan 2025, IVC normalized, TR   resolved, LVEF improved.  The left ventricle is severely dilated [>120ml.m2].  There is severe concentric left ventricular hypertrophy.  There is mild global hypokinesis of the left ventricle.  Left ventricular systolic function is mildly reduced.  LV ejection fraction = 40-45%.  Mild diastolic dysfunction [Grade IB] with likely increased left atrial   pressure. E-e   15 . E-e  <  8 is normal, > 15 is suggestive of increased PCWP.  The right ventricle is borderline dilated.  The right ventricular systolic function is normal.  The left atrium is severely dilated.  The right atrium is mildly to moderately dilated.  There is no significant valvular stenosis or regurgitation.  There was insufficient TR detected to calculate RV systolic pressure.  IVC size was normal.  The aortic sinus is normal size.  -  FINDINGS   LEFT VENTRICLE  The left ventricle is severely dilated. There is severe concentric left   ventricular hypertrophy.  Left ventricular systolic function is mildly reduced. LV ejection fraction   = 40-45%. Mild diastolic  dysfunction [Grade IB] with likely increased left atrial pressure. E-e     15 . E-e  < 8 is normal, >  15 is suggestive of increased PCWP. Left ventricular filling pattern is   prolonged relaxation. There  is mild global hypokinesis of the left ventricle.  -  RIGHT VENTRICLE  The right ventricle is borderline dilated. The right ventricular systolic   function is normal.  LEFT ATRIUM  The left atrium is severely dilated.  RIGHT ATRIUM  The right atrium is mildly to moderately dilated.  -  AORTIC VALVE  The aortic valve is normal in structure and function. There is trivial   aortic valve thickening.  There is no aortic stenosis. There is trace aortic regurgitation.  -  MITRAL VALVE  There is mild mitral valve thickening.  There is trace mitral   regurgitation.  -  TRICUSPID VALVE  The tricuspid valve is normal in structure and function. There is trace   tricuspid regurgitation.  There was insufficient TR detected to calculate RV systolic pressure.  -  PULMONIC VALVE  The pulmonic valve is normal in structure and function. Trace pulmonic   valvular regurgitation. There  is no pulmonic  valvular stenosis.  -  ARTERIES  The aortic sinus is normal size.  -  VENOUS  IVC size was normal.  -  EFFUSION  There is trivial pericardial effusion.  -  -  MMode-2D Measurements & Calculations  IVSd  2.1 cm       LA dim  6.0 cm              ESV MOD-sp4   78.8 ml        asc Aorta Diam  3.2 cm  LVIDd  5.3 cm      EDV MOD-sp4   181.0 ml      EDV MOD-sp2   188.0 ml  LVPWd  1.8 cm                                  ESV MOD-sp2   78.6 ml  LVIDs  3.9 cm              ___________________________________________________________________________  ______  LVOT diam  2.4 cm  SV MOD-sp4   102.2 ml       EF A4C  60.5 %               IVC 1  1.8 cm                     SI MOD-sp4   60.3 ml-m2              ___________________________________________________________________________  ______  LA ESV  BP         LA ESV Index  A2C           LA ESV Index  A4C            LA ESV Index  BP    106.0 ml           61.8 ml-m2                  65.3 ml-m2                   62.4 ml-m2              ___________________________________________________________________________  ______  SV A4C  116.5 ml  Doppler Measurements & Calculations  MV E max vel            MV dec time  0.30 sec SV LVOT   103.7 ml               LV V1 VTI  22.9 cm  68.1 cm-sec                                   Ao V2 max  238.0 cm-sec  MV A max vel                                  Ao max PG  22.7 mmHg  111.1 cm-sec                                  Ao V2 mean  139.8 cm-sec  MV E-A  0.61  Ao mean PG  9.6 mmHg  Med Peak E  Vel                                Ao V2 VTI  38.8 cm  4.7 cm-sec  E-Med E`  14.6                                AVA  VTI   2.7 cm2              ___________________________________________________________________________  ______  PA max PG  8.1 mmHg     TR max vel            AS Dimensionless Index  VTI      AVAi VTI  cm^2-m^2                           313.7 cm-sec          0.59                          TR max PG  39.4 mmHg                                   1.6 cm2              ___________________________________________________________________________  ______  SV index LVOT    61.2 ml-m2  ___________________________________________________________________________  ___  Reading Physician                     MD Erna Creighton, MD, [id number] 07-05-2024 12 07 PM    XR Chest 1 View  Final Result by Ozell Ray Essex, MD (10/01 1717)  XR CHEST 1 VIEW, 07/04/2024 7:14 AM    INDICATION:Chest Pain   COMPARISON: 06/27/2024 CT chest abdomen pelvis.    FINDINGS:   . Support devices: None.  . Chest: Multichamber cardiomegaly. Patchy perihilar and pericardiac   opacities consistent with pulmonary edema. Left basilar opacity likely   atelectasis. No large pleural effusion. No pneumothorax.  SABRA Upper abdomen: Normal.   . Musculoskeletal structures: Degenerative changes.    IMPRESSION:  Cardiomegaly with pulmonary edema.             Electronically signed by: Herlene Nian, NP 07/06/2024 11:42 AM   Time spent on discharge: 48 minutes

## 2024-07-07 DIAGNOSIS — M25552 Pain in left hip: Secondary | ICD-10-CM | POA: Diagnosis not present

## 2024-07-07 DIAGNOSIS — M85852 Other specified disorders of bone density and structure, left thigh: Secondary | ICD-10-CM | POA: Diagnosis not present

## 2024-07-07 DIAGNOSIS — M87052 Idiopathic aseptic necrosis of left femur: Secondary | ICD-10-CM | POA: Diagnosis not present

## 2024-07-07 DIAGNOSIS — Z743 Need for continuous supervision: Secondary | ICD-10-CM | POA: Diagnosis not present

## 2024-07-07 DIAGNOSIS — M545 Low back pain, unspecified: Secondary | ICD-10-CM | POA: Diagnosis not present

## 2024-07-07 DIAGNOSIS — M160 Bilateral primary osteoarthritis of hip: Secondary | ICD-10-CM | POA: Diagnosis not present

## 2024-07-07 DIAGNOSIS — M87051 Idiopathic aseptic necrosis of right femur: Secondary | ICD-10-CM | POA: Diagnosis not present

## 2024-07-07 DIAGNOSIS — G8929 Other chronic pain: Secondary | ICD-10-CM | POA: Diagnosis not present

## 2024-07-07 NOTE — ED Triage Notes (Addendum)
 Pt brought into ED by [facility]. Pt coming from dialysis where she goes T, T, S. Pt had 1.4 L removed today, but did not complete treatment due to complaints of pain and staff calling EMS for transport. Pt and EMS reported she was awakened approx 0700 this morning with pain in bilat feet, L hip, L buttock, L abd, and L lower back. Pt stated pain makes ambulation difficult and unable to get comfortable. Pt stated pain is getting worse throughout the day. Pt described pain as sharp and constant. Pt also advised she has been having same complaints for approx 6 months, but became a lot worse this morning.  Pt on O2 at baseline 3 lpm [place].  Pt was admitted to this hospital on 10-1 for c/p and [facility] and discharged yesterday.

## 2024-07-10 ENCOUNTER — Emergency Department (HOSPITAL_COMMUNITY)
Admission: EM | Admit: 2024-07-10 | Discharge: 2024-07-11 | Disposition: A | Discharge status: Home / Self Care | Attending: Emergency Medicine | Admitting: Emergency Medicine

## 2024-07-10 ENCOUNTER — Other Ambulatory Visit: Payer: Self-pay

## 2024-07-10 ENCOUNTER — Other Ambulatory Visit: Payer: Self-pay | Admitting: Student

## 2024-07-10 ENCOUNTER — Emergency Department (HOSPITAL_COMMUNITY)

## 2024-07-10 ENCOUNTER — Encounter (HOSPITAL_COMMUNITY): Payer: Self-pay | Admitting: Emergency Medicine

## 2024-07-10 DIAGNOSIS — R079 Chest pain, unspecified: Secondary | ICD-10-CM | POA: Diagnosis not present

## 2024-07-10 DIAGNOSIS — R059 Cough, unspecified: Secondary | ICD-10-CM | POA: Diagnosis not present

## 2024-07-10 DIAGNOSIS — N186 End stage renal disease: Secondary | ICD-10-CM | POA: Diagnosis not present

## 2024-07-10 DIAGNOSIS — R062 Wheezing: Secondary | ICD-10-CM | POA: Diagnosis not present

## 2024-07-10 DIAGNOSIS — Z992 Dependence on renal dialysis: Secondary | ICD-10-CM | POA: Diagnosis not present

## 2024-07-10 DIAGNOSIS — R0602 Shortness of breath: Secondary | ICD-10-CM | POA: Diagnosis not present

## 2024-07-10 DIAGNOSIS — I517 Cardiomegaly: Secondary | ICD-10-CM | POA: Diagnosis not present

## 2024-07-10 DIAGNOSIS — R0789 Other chest pain: Secondary | ICD-10-CM | POA: Diagnosis not present

## 2024-07-10 DIAGNOSIS — J40 Bronchitis, not specified as acute or chronic: Secondary | ICD-10-CM | POA: Diagnosis not present

## 2024-07-10 DIAGNOSIS — Z7951 Long term (current) use of inhaled steroids: Secondary | ICD-10-CM | POA: Insufficient documentation

## 2024-07-10 DIAGNOSIS — I120 Hypertensive chronic kidney disease with stage 5 chronic kidney disease or end stage renal disease: Secondary | ICD-10-CM | POA: Diagnosis not present

## 2024-07-10 DIAGNOSIS — I509 Heart failure, unspecified: Secondary | ICD-10-CM | POA: Diagnosis not present

## 2024-07-10 DIAGNOSIS — J441 Chronic obstructive pulmonary disease with (acute) exacerbation: Secondary | ICD-10-CM | POA: Diagnosis not present

## 2024-07-10 LAB — BASIC METABOLIC PANEL WITH GFR
Anion gap: 13 (ref 5–15)
BUN: 41 mg/dL — ABNORMAL HIGH (ref 8–23)
CO2: 26 mmol/L (ref 22–32)
Calcium: 8.8 mg/dL — ABNORMAL LOW (ref 8.9–10.3)
Chloride: 97 mmol/L — ABNORMAL LOW (ref 98–111)
Creatinine, Ser: 7.51 mg/dL — ABNORMAL HIGH (ref 0.44–1.00)
GFR, Estimated: 5 mL/min — ABNORMAL LOW (ref >60)
Glucose, Bld: 107 mg/dL — ABNORMAL HIGH (ref 70–99)
Potassium: 4.3 mmol/L (ref 3.5–5.1)
Sodium: 136 mmol/L (ref 135–145)

## 2024-07-10 LAB — CBC
HCT: 30 % — ABNORMAL LOW (ref 36.0–46.0)
Hemoglobin: 9.6 g/dL — ABNORMAL LOW (ref 12.0–15.0)
MCH: 27.6 pg (ref 26.0–34.0)
MCHC: 32 g/dL (ref 30.0–36.0)
MCV: 86.2 fL (ref 80.0–100.0)
Platelets: 180 K/uL (ref 150–400)
RBC: 3.48 MIL/uL — ABNORMAL LOW (ref 3.87–5.11)
RDW: 17.3 % — ABNORMAL HIGH (ref 11.5–15.5)
WBC: 4.4 K/uL (ref 4.0–10.5)
nRBC: 0 % (ref 0.0–0.2)

## 2024-07-10 LAB — TROPONIN I (HIGH SENSITIVITY)
Troponin I (High Sensitivity): 128 ng/L (ref <18)
Troponin I (High Sensitivity): 106 ng/L (ref <18)

## 2024-07-10 LAB — BRAIN NATRIURETIC PEPTIDE: B Natriuretic Peptide: 532.6 pg/mL — ABNORMAL HIGH (ref 0.0–100.0)

## 2024-07-10 MED ORDER — PREDNISONE 20 MG PO TABS: 40.0000 mg | ORAL_TABLET | Freq: Every day | ORAL | 0 refills | Status: AC

## 2024-07-10 MED ORDER — ALBUTEROL SULFATE HFA 108 (90 BASE) MCG/ACT IN AERS
2.0000 | INHALATION_SPRAY | Freq: Once | RESPIRATORY_TRACT | Status: AC
  Administered 2024-07-10: 2 via RESPIRATORY_TRACT
  Filled 2024-07-10: qty 6.7

## 2024-07-10 MED ORDER — ONDANSETRON HCL 4 MG/2ML IJ SOLN
4.0000 mg | Freq: Once | INTRAMUSCULAR | Status: AC
  Administered 2024-07-10: 4 mg via INTRAVENOUS
  Filled 2024-07-10: qty 2

## 2024-07-10 MED ORDER — METHYLPREDNISOLONE SODIUM SUCC 125 MG IJ SOLR
125.0000 mg | Freq: Once | INTRAMUSCULAR | Status: AC
  Administered 2024-07-10: 125 mg via INTRAVENOUS
  Filled 2024-07-10: qty 2

## 2024-07-10 MED ORDER — HYDROMORPHONE HCL 1 MG/ML IJ SOLN
1.0000 mg | Freq: Once | INTRAMUSCULAR | Status: AC
  Administered 2024-07-10: 1 mg via INTRAVENOUS
  Filled 2024-07-10: qty 1

## 2024-07-10 MED ORDER — ALBUTEROL SULFATE (2.5 MG/3ML) 0.083% IN NEBU
5.0000 mg | INHALATION_SOLUTION | Freq: Once | RESPIRATORY_TRACT | Status: AC
  Administered 2024-07-10: 5 mg via RESPIRATORY_TRACT
  Filled 2024-07-10: qty 6

## 2024-07-10 MED ORDER — OXYCODONE-ACETAMINOPHEN 5-325 MG PO TABS
1.0000 | ORAL_TABLET | Freq: Once | ORAL | Status: AC
  Administered 2024-07-10: 1 via ORAL
  Filled 2024-07-10: qty 1

## 2024-07-10 NOTE — ED Triage Notes (Signed)
 Pt to ER via EMS form dialysis.  Pt has been having bronchitis symptoms for last several days.  Today upon starting dialysis she began having sharp diffuse CP.  No associated symptoms.  EMS reports expiratory wheezes, pt given 5mg  albuterol .

## 2024-07-10 NOTE — ED Provider Triage Note (Signed)
 Emergency Medicine Provider Triage Evaluation Note  Audrey Hill , a 68 y.o. female  was evaluated in triage.  Pt complains of chest pain and shortness of breath.  Patient is a dialysis patient and last dialyzed today.  Patient dates that she has some chest pain and chest pressure.  Patient also has some shortness of breath.  Patient also has a history of COPD and wears oxygen.  Of note patient was just admitted to [facility] for chest pain and was thought to have COPD exacerbation  Review of Systems  Positive: Chest pain and shortness of breath Negative: Fever  Physical Exam  BP 115/65   Pulse 74   Temp 98.1 F (36.7 C) (Oral)   Resp 18   Ht 5' 6 (1.676 m)   Wt 66 kg   SpO2 99%   BMI 23.48 kg/m  Gen:   Awake, chronically ill Resp:  Slightly tachypneic and minimal wheezing throughout MSK:   Moves extremities without difficulty  Other:    Medical Decision Making  Medically screening exam initiated at 2:40 PM.  Appropriate orders placed.  Audrey Hill was informed that the remainder of the evaluation will be completed by another provider, this initial triage assessment does not replace that evaluation, and the importance of remaining in the ED until their evaluation is complete.  Audrey Hill is a 68 y.o. female here presenting with chest pain and shortness of breath.  Patient has history of dialysis and was recently admitted for similar symptoms.  Considered COPD exacerbation versus CHF.  Labs are sent and ordered chest x-ray and albuterol  and Solu-Medrol .  Patient is stable for waiting    Patt Alm Macho, MD 07/10/24 [phone]

## 2024-07-10 NOTE — ED Provider Notes (Signed)
 [place] EMERGENC[facility] DEPARTMENT AT [facility] Provider Note   CSN: [id number] Arrival date & time: 07/10/24  1333     Patient presents with: Chest Pain   Audrey Hill is a 68 y.o. female history of ESRD on dialysis, chronic pain syndrome, COPD, here presenting with shortness of breath.  Patient was at dialysis and sent here for shortness of breath.  Of note patient was recently admitted to [facility] for heart failure exacerbation and COPD.  Patient states that she has been coughing for several days.  Denies any fevers at home.  Of note patient is on Dilaudid  at baseline and states that she ran out and she is due for refill tomorrow   The history is provided by the patient.       Prior to Admission medications   Medication Sig Start Date End Date Taking? Authorizing Provider  albuterol  (VENTOLIN  HFA) 108 (90 Base) MCG/ACT inhaler INHALE 2 PUFFS BY MOUTH EVERY 6 HOURS AS NEEDED FOR COUGH 03/13/24   [name], DO  amiodarone  (PACERONE ) 200 MG tablet Take 1 tablet (200 mg total) by mouth daily. 11/11/23   [name], DO  atorvastatin  (LIPITOR) 40 MG tablet Take 1 tablet (40 mg total) by mouth daily. 12/16/23   [name], DO  diclofenac  Sodium (VOLTAREN ) 1 % GEL Apply 4 g topically 4 (four) times daily. 12/09/23   Amoako, Prince, MD  ferrous sulfate 325 (65 FE) MG tablet Take 325 mg by mouth every other day.    [provider]  furosemide  (LASIX ) 80 MG tablet Take 1 tablet (80 mg total) by mouth daily. 05/29/24 08/27/24  [name], DO  hydrALAZINE  (APRESOLINE ) 50 MG tablet Take 1 tablet (50 mg total) by mouth 3 (three) times daily. 05/29/24   [name], DO  HYDROmorphone  (DILAUDID ) 2 MG tablet Take 1 tablet (2 mg total) by mouth every 12 (twelve) hours as needed for severe pain (pain score 7-10). 05/12/24   Rosan Dayton BROCKS, DO  HYDROmorphone  (DILAUDID ) 2 MG tablet Take 1 tablet (2 mg total) by mouth every 12 (twelve) hours as needed for severe pain (pain  score 7-10). 06/11/24   Rosan Dayton BROCKS, DO  HYDROmorphone  (DILAUDID ) 2 MG tablet Take 1 tablet (2 mg total) by mouth every 12 (twelve) hours as needed for severe pain (pain score 7-10). 07/11/24   Rosan Dayton BROCKS, DO  isosorbide  dinitrate (ISORDIL ) 20 MG tablet TAKE 1 TABLET (20 MG TOTAL) BY MOUTH 3 (THREE) TIMES DAILY. 04/12/24   Rosan Dayton BROCKS, DO  lidocaine  (LIDODERM ) 5 % Place 1 patch onto the skin daily as needed. Remove & Discard patch within 12 hours or as directed by MD 05/14/24   [name], DO  losartan  (COZAAR ) 50 MG tablet Take 1 tablet (50 mg total) by mouth daily. 11/11/23 11/10/24  [name], DO  Methoxy PEG-Epoetin Beta (MIRCERA IJ) 100 mcg. 04/17/24 04/16/25  [provider]  mirtazapine  (REMERON ) 15 MG tablet Take 1 tablet (15 mg total) by mouth at bedtime. 06/28/24 06/28/25  [name], DO  multivitamin (RENA-VIT) TABS tablet Take 1 tablet by mouth daily. 01/27/24   [name], DO  pantoprazole  (PROTONIX ) 40 MG tablet TAKE 1 TABLET BY MOUTH EVERY DAY 06/12/24   [name], DO  polyethylene glycol (MIRALAX  / GLYCOLAX ) 17 g packet Take 17 g by mouth daily.    [provider]  senna (SENOKOT) 8.6 MG TABS tablet Take 2 tablets by mouth daily as needed for mild constipation.  12/19/23   [provider]  sertraline  (ZOLOFT ) 25 MG tablet Take 1 tablet (25 mg total) by mouth daily. 02/06/24   [name], DO  sevelamer  carbonate (RENVELA ) 800 MG tablet Take 1 tablet (800 mg total) by mouth 3 (three) times daily with meals. 02/09/24   [name], DO  triamcinolone (KENALOG) 0.025 % ointment Apply 1 Application topically daily as needed (shin). 12/13/23   [provider]    Allergies: Diphenhydramine  hcl, Lisinopril, Metoclopramide , Nortriptyline, Tramadol, Nitroglycerin , Tylenol  [acetaminophen ], Ibuprofen, Norvasc  [amlodipine  besylate], and Nsaids    Review of Systems  Respiratory:  Positive for shortness of breath.   Cardiovascular:  Positive for chest pain.  All  other systems reviewed and are negative.   Updated Vital Signs BP (!) 140/62 (BP Location: Right Arm)   Pulse 66   Temp 97.7 F (36.5 C)   Resp 20   Ht 5' 6 (1.676 m)   Wt 66 kg   SpO2 100%   BMI 23.48 kg/m   Physical Exam Vitals and nursing note reviewed.  Constitutional:      Comments: Chronically ill-appearing  HENT:     Head: Normocephalic.  Eyes:     Extraocular Movements: Extraocular movements intact.     Pupils: Pupils are equal, round, and reactive to light.  Cardiovascular:     Rate and Rhythm: Normal rate and regular rhythm.     Heart sounds: Normal heart sounds.  Pulmonary:     Comments: Diminished bilaterally Abdominal:     General: Bowel sounds are normal.     Palpations: Abdomen is soft.  Musculoskeletal:        General: Normal range of motion.     Cervical back: Normal range of motion and neck supple.  Skin:    General: Skin is warm.     Capillary Refill: Capillary refill takes less than 2 seconds.  Neurological:     Mental Status: She is oriented to person, place, and time.  Psychiatric:        Mood and Affect: Mood normal.     (all labs ordered are listed, but only abnormal results are displayed) Labs Reviewed  BASIC METABOLIC PANEL WITH GFR - Abnormal; Notable for the following components:      Result Value   Chloride 97 (*)    Glucose, Bld 107 (*)    BUN 41 (*)    Creatinine, Ser 7.51 (*)    Calcium  8.8 (*)    GFR, Estimated 5 (*)    All other components within normal limits  CBC - Abnormal; Notable for the following components:   RBC 3.48 (*)    Hemoglobin 9.6 (*)    HCT 30.0 (*)    RDW 17.3 (*)    All other components within normal limits  BRAIN NATRIURETIC PEPTIDE - Abnormal; Notable for the following components:   B Natriuretic Peptide 532.6 (*)    All other components within normal limits  TROPONIN I (HIGH SENSITIVITY) - Abnormal; Notable for the following components:   Troponin I (High Sensitivity) 128 (*)    All other  components within normal limits  TROPONIN I (HIGH SENSITIVITY) - Abnormal; Notable for the following components:   Troponin I (High Sensitivity) 106 (*)    All other components within normal limits    EKG: EKG Interpretation Date/Time:  Tuesday July 10 2024 13:44:15 EDT Ventricular Rate:  74 PR Interval:  156 QRS Duration:  120 QT Interval:  442 QTC Calculation: 490 R Axis:   -  52  Text Interpretation: Normal sinus rhythm Possible Left atrial enlargement Left anterior fascicular block Left ventricular hypertrophy with QRS widening and repolarization abnormality ( R in aVL , Cornell product ) Cannot rule out Septal infarct , age undetermined Abnormal ECG When compared with ECG of 28-Jun-2024 14:37, PREVIOUS ECG IS PRESENT Confirmed by Patt Alm DEL ([id number]) on 07/10/2024 3:56:27 PM  Radiology: DG Chest 2 View Result Date: 07/10/2024 CLINICAL DATA:  Chest pain. Bronchitis symptoms for several days. Sharp diffuse chest pain. Extra tori wheezes. EXAM: CHEST - 2 VIEW COMPARISON:  06/28/2024 FINDINGS: Diffuse cardiac enlargement similar to prior study. No vascular congestion or edema. No pleural effusion or pneumothorax. Lungs are clear. Old right rib fractures. Mediastinal contours appear intact. IMPRESSION: Diffuse cardiac enlargement similar to prior study. Lungs are clear. Electronically Signed   By: Elsie Gravely M.D.   On: 07/10/2024 15:35     Procedures   Medications Ordered in the ED  HYDROmorphone  (DILAUDID ) injection 1 mg (has no administration in time range)  albuterol  (PROVENTIL ) (2.5 MG/3ML) 0.083% nebulizer solution 5 mg (has no administration in time range)  ondansetron  (ZOFRAN ) injection 4 mg (has no administration in time range)  albuterol  (VENTOLIN  HFA) 108 (90 Base) MCG/ACT inhaler 2 puff (2 puffs Inhalation Given 07/10/24 1546)  methylPREDNISolone  sodium succinate (SOLU-MEDROL ) 125 mg/2 mL injection 125 mg (125 mg Intravenous Given 07/10/24 1544)  oxyCODONE -acetaminophen   (PERCOCET/ROXICET) 5-325 MG per tablet 1 tablet (1 tablet Oral Given 07/10/24 1544)                                    Medical Decision Making EZRA MARQUESS is a 68 y.o. female here presenting with shortness of breath and chest pain.  Symptoms going on for several days.  Patient recently admitted for heart failure exacerbation and COPD.  Patient is on chronic oxygen.  Patient is doing well on her chronic 3 L.  Consider COPD exacerbation versus pneumonia versus heart failure exacerbation.  Plan to get CBC and CMP and troponin x 2 and BNP and chest x-ray.  Will give steroids and albuterol  and reassess  7:17 PM I reviewed patient's labs and initial troponin was 128 and repeat was 106.  Her baseline troponin is right around 120.  Her BNP is 500 but last time when she was admitted her BNP was over 3000.  Chest x-ray did not show any pneumonia or pulmonary edema.  Patient is doing well on her baseline 3 L after nebs and steroids.  Will discharge home with course of steroids.   Problems Addressed: Chronic obstructive pulmonary disease with acute exacerbation (HCC): acute illness or injury ESRD (end stage renal disease) on dialysis [facility]): acute illness or injury  Amount and/or Complexity of Data Reviewed Labs: ordered. Decision-making details documented in ED Course. Radiology: ordered and independent interpretation performed. Decision-making details documented in ED Course.  Risk Prescription drug management.      ED Discharge Orders     None          Patt Alm Macho, MD 07/10/24 [phone]

## 2024-07-10 NOTE — Discharge Instructions (Signed)
 You likely have COPD exacerbation  Please take prednisone  40 mg daily for 5 days  Please continue taking your pain meds as prescribed by your doctor  Please go to dialysis as scheduled on Thursday  Follow-up with your doctor  Return to ER if you have worse shortness of breath or trouble breathing or fever or vomiting

## 2024-07-11 DIAGNOSIS — R531 Weakness: Secondary | ICD-10-CM | POA: Diagnosis not present

## 2024-07-11 DIAGNOSIS — Z743 Need for continuous supervision: Secondary | ICD-10-CM | POA: Diagnosis not present

## 2024-07-11 DIAGNOSIS — R5383 Other fatigue: Secondary | ICD-10-CM | POA: Diagnosis not present

## 2024-07-11 DIAGNOSIS — Z7401 Bed confinement status: Secondary | ICD-10-CM | POA: Diagnosis not present

## 2024-07-11 NOTE — ED Notes (Signed)
 [facility] has arrived to take the patient home.

## 2024-07-12 ENCOUNTER — Ambulatory Visit: Payer: Self-pay

## 2024-07-12 NOTE — Telephone Encounter (Signed)
 FYI Only or Action Required?: FYI only for provider.  Patient was last seen in primary care on 06/28/2024 by Audrey Gaines, DO.  Called Nurse Triage reporting Spasms.  Symptoms began yesterday.  Interventions attempted: Rest, hydration, or home remedies.  Symptoms are: unchanged.  Triage Disposition: See PCP Within 2 Weeks (overriding See PCP When Office is Open (Within 3 Days))  Patient/caregiver understands and will follow disposition?: Yes  Copied from CRM [id number]. Topic: Clinical - Red Word Triage >> Jul 12, 2024  3:28 PM Fredrica W wrote: Red Word that prompted transfer to Nurse Triage: Muscle spasms when cough really bad - all over since last night. Breathing concerns. Reason for Disposition  [1] MODERATE back pain (e.g., interferes with normal activities) AND [2] present > 3 days  Answer Assessment - Initial Assessment Questions Pt states that when she coughs she gets muscle spasms down in her lower back near her waist 9/10 pain. Started yesterday. Has had cough for a couple days.   1. ONSET: When did the pain begin? (e.g., minutes, hours, days)     yesterday 2. LOCATION: Where does it hurt? (upper, mid or lower back)     Back around waist 3. SEVERITY: How bad is the pain?  (e.g., Scale 1-10; mild, moderate, or severe)     9/10 4. PATTERN: Is the pain constant? (e.g., yes, no; constant, intermittent)      Intermittent; only when coughs 5. RADIATION: Does the pain shoot into your legs or somewhere else?     denies 6. CAUSE:  What do you think is causing the back pain?      coughing 7. BACK OVERUSE:  Any recent lifting of heavy objects, strenuous work or exercise?     coughing 8. MEDICINES: What have you taken so far for the pain? (e.g., nothing, acetaminophen , NSAIDS)     denies 9. NEUROLOGIC SYMPTOMS: Do you have any weakness, numbness, or problems with bowel/bladder control?     denies 10. OTHER SYMPTOMS: Do you have any other symptoms? (e.g.,  fever, abdomen pain, burning with urination, blood in urine)       denies  Protocols used: Back Pain-A-AH

## 2024-07-12 NOTE — Telephone Encounter (Signed)
 Called pt about back spasms - no answer. Pt has an appt on Monday 10/13; left message on vm of available appt tomorrow,Friday, if a sooner appt is needed, to call the office back.

## 2024-07-12 NOTE — Telephone Encounter (Unsigned)
 Copied from CRM [id number]. Topic: Appointments - Appointment Info/Confirmation >> Jul 12, 2024  4:14 PM [name] wrote: Patient/patient representative is calling for information regarding an appointment.   Amira from Transportation [phone]: Confirmed app for 10/13 at 9:45 am no further questions

## 2024-07-14 DIAGNOSIS — G4489 Other headache syndrome: Secondary | ICD-10-CM | POA: Diagnosis not present

## 2024-07-14 DIAGNOSIS — N186 End stage renal disease: Secondary | ICD-10-CM | POA: Diagnosis not present

## 2024-07-14 DIAGNOSIS — R0781 Pleurodynia: Secondary | ICD-10-CM | POA: Diagnosis not present

## 2024-07-14 DIAGNOSIS — R079 Chest pain, unspecified: Secondary | ICD-10-CM | POA: Diagnosis not present

## 2024-07-14 DIAGNOSIS — Z992 Dependence on renal dialysis: Secondary | ICD-10-CM | POA: Diagnosis not present

## 2024-07-14 DIAGNOSIS — I517 Cardiomegaly: Secondary | ICD-10-CM | POA: Diagnosis not present

## 2024-07-14 DIAGNOSIS — R059 Cough, unspecified: Secondary | ICD-10-CM | POA: Diagnosis not present

## 2024-07-14 NOTE — ED Triage Notes (Signed)
 Pt sts she has had cough with chest wall pain for past 2-3 days with no fever noted Pt completed her full hemo tx today.

## 2024-07-15 DIAGNOSIS — R9431 Abnormal electrocardiogram [ECG] [EKG]: Secondary | ICD-10-CM | POA: Diagnosis not present

## 2024-07-16 ENCOUNTER — Ambulatory Visit: Payer: Self-pay | Admitting: Student
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
## 2024-07-23 ENCOUNTER — Encounter: Payer: Self-pay | Admitting: *Deleted

## 2024-07-23 ENCOUNTER — Other Ambulatory Visit

## 2024-07-23 ENCOUNTER — Telehealth: Payer: Self-pay | Admitting: *Deleted

## 2024-07-23 ENCOUNTER — Telehealth: Payer: Self-pay

## 2024-07-23 NOTE — Telephone Encounter (Signed)
 Attempted to contact patient for scheduled appointment for medication management. Left HIPAA compliant message for patient to return my call at their convenience. Reminded her of upcoming appt at [facility] for hospital f/u on 07/25/24. Will re-attempt to outreach patient after PCP appt.   Lorain Baseman, PharmD [facility] [phone]

## 2024-07-23 NOTE — Patient Instructions (Signed)
 Heron GORMAN Sauers - I am sorry I was unable to reach you today for our scheduled appointment. I work with [name], DO and am calling to support your healthcare needs. Please contact me at [phone] at your earliest convenience. I look forward to speaking with you soon.   Thank you,   Olam Ku, RN, BSN [place]  [facility], [facility] RN Care Manager Direct Dial : [phone]  Fax: [phone]

## 2024-07-23 NOTE — Progress Notes (Deleted)
 07/23/2024 Name: Audrey Hill MRN: [id number] DOB: [birth year]  No chief complaint on file.   Audrey Hill is a 68 y.o. year old female who presented for a telephone visit.   They were referred to the pharmacist by their PCP for assistance in managing complex medication management and polypharmacy. PMH includes HTN, Afib, HFrEF, pulmonary HTN, asthma, GERD, ESRD on dialysis, HLD, MDD, chronic pain.   Subjective: Patient was seen by Melvenia Morrison, MD, on 05/04/24 for an acute viral illness. She was given toradol  in office and instructed to increase trazodone  to 100 mg nightly for insomnia (she was prescribed a 50 mg tablet to take in addition to the 50 mg tablets included in her pill packs).  She was engaged by pharmacy via telephone on 05/28/24. Assisted in adding higher dose of hydralazine  50 mg TID to her pill packs from [facility]. Patient was seen by Dr. Libby Blanch on 06/28/24. BP was 180/79 mmHg. Trazodone  was switched to mirtazapine  for insomnia. No changes to BP meds were made given upcoming dialysis appt. Following this, patient has had multiple ED visits and an admission at [facility] for URI and pleuritic CP (likely due to pulmonary edema). Improved with dialysis. On 07/10/24, she presented to [facility] ED from dialysis with ShOB. Discharged with prednisone  40 mg daily x 5 days. Returned to ED with continued CP and cough on 07/14/24.  BP 111/60 at recent dialysis appt (post-dialysis)  Today, patient reports doing ok. *** Schedule with PCP now (1 mo f/u at 06/28/24) Was not able to show for PFTs?  A-fib: Amiodarone  200 mg  Hyperlipidemia: Lipitor 40 mg daily  Constipation:  MiraLAX  17 mg twice daily, senna 2 mg daily ESRD: Calcium  acetate 3 times daily, sevelamer  800 mg 3 times daily, Renavite tabs HFrEF: Lasix  80 mg daily Pain:  Voltaren  gel, lidocaine  patch, Dilaudid  2 mg every 12 hours as needed Iron  deficiency anemia: Ferrous sulfate 325 mg  every other day, Mircera  Hypertension: Hydralazine  50 mg 3 times daily, isosorbide  dinitrate 20 mg 3 times daily, losartan  50 mg daily Chronic pain: Dilaudid  2 mg every 8 hours as needed Insomnia: Mirtazapine  15 mg nightly GERD: Protonix  40 mg daily Depression: Zoloft  25 mg daily Hyperlipidemia: Lipitor 20 mg daily Reactive airway disease: Albuterol   Care Team: Primary Care Provider: Blanch Libby, DO ; needs to be scheduled ~ Feb 2026  Medication Access/Adherence  Current Pharmacy:  [id number] - [place], [place] - [address] [place] [address] Phone: [phone] Fax: [phone]  [facility] - [place]  - [name] - [address] [place] [address] Phone: [phone] Fax: [phone]  [id number] - [place], [place] - [address] [place] [address] Phone: [phone] Fax: [phone]   Patient reports affordability concerns with their medications: No  - UHC Dual Complete Patient reports access/transportation concerns to their pharmacy: No  Patient reports adherence concerns with their medications:  No  - reports the only medication she wants to make sure she can refill is her pain  medication  Medication Management:  Current adherence strategy: [facility] pill packs  Called [facility] to confirm what all was in her most recent pill pack - Amiodarone  200 mg daily - Atorvastatin  40 mg daily - Furosemide  80 mg daily - Isosorbide  dinitrate 20 mg TID - Hydralazine   50 mg TID - Losartan  50 mg daily - Pantoprazole  40 mg daily - Rena-vites - Sertraline  25 mg daily - trazodone  50 mg (reports that she is taking additional trazodone  50 mg as prescribed, 100 mg total)  [facility] also filled:  - Albuterol  inhaler - Hydralazine  25 mg (in a bottle) - written for 15 day supply (2 tabs TID) and needs to be 30 day supply for  it to go in packs. Patient reports she is taking one tab (25 mg) TID. - Filled carvedilol  last month, then was put on hold (3.125 mg). Patient advised to discontinue due to hypotension on 05/02/24.  Sevelamer  last filled 11/30/23 for 30ds from [facility]  Patient reports Good adherence to medications  Hypertension/HFrEF (EF 30-35%, Grade III diastolic dysfunction with severe LVH, normal RV, bi-atrial enlargement in Oct 2022):  Current medications: hydralazine  50 mg TID (patient taking 25 mg TID), losartan  50 mg daily Medications previously tried: carvedilol  - hypotension  Patient does not have a validated, automated, upper arm home BP cuff Current blood pressure readings readings: last BP at dialysis on 05/22/24 was 142/86  Patient denies hypotensive s/sx including dizziness, lightheadedness.  Patient denies hypertensive symptoms including headache, chest pain, shortness of breath   Objective:  BP Readings from Last 3 Encounters:  07/11/24 (!) 160/69  06/28/24 (!) 169/79  06/28/24 (!) 180/79    Lab Results  Component Value Date   HGBA1C 5.6 08/03/2021   HGBA1C 5.7 (H) 08/23/2011       Latest Ref Rng & Units 07/10/2024    1:51 PM 06/28/2024    2:40 PM 06/09/2024    4:33 PM  BMP  Glucose 70 - 99 mg/dL 892  87  898   BUN 8 - 23 mg/dL 41  27  21   Creatinine 0.44 - 1.00 mg/dL 2.48  4.55  6.19   Sodium 135 - 145 mmol/L 136  136  138   Potassium 3.5 - 5.1 mmol/L 4.3  3.8  4.4   Chloride 98 - 111 mmol/L 97  95  106   CO2 22 - 32 mmol/L 26  25  22    Calcium  8.9 - 10.3 mg/dL 8.8  8.5  9.0     Lab Results  Component Value Date   CHOL 113 08/05/2022   HDL 72 08/05/2022   LDLCALC 34 08/05/2022   TRIG 33 08/05/2022   CHOLHDL 1.6 08/05/2022    Medications Reviewed Today   Medications were not reviewed in this encounter       Assessment/Plan:   Heart Failure: - Currently appropriately managed. Patient is not having s/sx of hypotension since discontinuing carvedilol . She  is taking a lower dose of hydralazine  than prescribed. BP at dialysis are slightly elevated. Would recommend increasing hydralazine  to 50 mg TID as prescribed. - Patient has not been able to access a blood pressure cuff and is not currently monitoring at home - Weight gain is monitored and managed at dialysis - Recommend to continue current regimen  Current medications:  ACEi/ARB/ARNI: losartan  50 mg daily SGLT2i: none - ESRD Beta blocker: discontinued carvedilol  due to hypotension Mineralocorticoid Receptor Antagonist: none- ESRD Diuretic regimen: furosemide  80 mg daily Hydral/nitrate: hydralazine  50 mg TID, isosorbide  dinitrate 20 mg TID   Medication Management: - Currently strategy sufficient to maintain appropriate adherence to prescribed medication regimen - Will collaborate with PCP to update Rx for hydralazine  so that it will be included in next pack   Patient verbalized understanding of treatment plan.  Follow Up Plan:  Pharmacist 07/23/24 PCP clinic visit needs to be scheduled ~ Feb 2026   Lorain Baseman, PharmD [facility] [phone]

## 2024-07-25 ENCOUNTER — Encounter: Payer: Self-pay | Admitting: Student

## 2024-07-25 ENCOUNTER — Inpatient Hospital Stay: Payer: Self-pay | Admitting: Student

## 2024-07-25 ENCOUNTER — Ambulatory Visit (INDEPENDENT_AMBULATORY_CARE_PROVIDER_SITE_OTHER): Admitting: Student

## 2024-07-25 VITALS — BP 144/64 | HR 81 | Temp 98.7°F | Ht 66.0 in | Wt 143.4 lb

## 2024-07-25 DIAGNOSIS — Z91199 Patient's noncompliance with other medical treatment and regimen due to unspecified reason: Secondary | ICD-10-CM | POA: Diagnosis not present

## 2024-07-25 DIAGNOSIS — I10 Essential (primary) hypertension: Secondary | ICD-10-CM | POA: Diagnosis not present

## 2024-07-25 DIAGNOSIS — Z79899 Other long term (current) drug therapy: Secondary | ICD-10-CM

## 2024-07-25 DIAGNOSIS — M791 Myalgia, unspecified site: Secondary | ICD-10-CM

## 2024-07-25 DIAGNOSIS — G4709 Other insomnia: Secondary | ICD-10-CM

## 2024-07-25 MED ORDER — MIRTAZAPINE 15 MG PO TABS: 7.5000 mg | ORAL_TABLET | Freq: Every day | ORAL | Status: DC

## 2024-07-25 NOTE — Assessment & Plan Note (Addendum)
 Poorly controlled; prior trials of melatonin and trazodone  (intolerant). Mirtazapine  15 mg causing leg pain, this is not a known side effect of the medicine.  Plan: - Reduce mirtazapine  to 7.5 mg qHS; discontinue if pain persists. - Reinforce sleep hygiene (avoid screens/caffeine before bed, consistent schedule).

## 2024-07-25 NOTE — Patient Instructions (Addendum)
 It was a pleasure taking care of you today!    I have placed a referral to physical therapy for your diffuse muscle pain and spasms.  For your chronic insomnia, you may decrease mirtazapine  to 7.5 mg to see if your body tolerates it; it is completely optional if you choose not to take it. We discussed sleep hygiene -- please avoid screens one to two hours before bedtime and avoid coffee at least 4-5 hours before sleep.  Continue with dialysis and follow up with your other specialists.  I have ordered the following labs for you:  Lab Orders  No laboratory test(s) ordered today      Follow up: 3 months   Should you have any questions or concerns please call the internal medicine clinic at [phone].     Missy Sandhoff, MD  [facility]
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
## 2024-07-25 NOTE — Assessment & Plan Note (Addendum)
 Mildly elevated, but stable.  She has dialysis tomorrow.  Plan: - Continue losartan  50 mg daily - Continue isosorbide  dinitrate 20 mg 3 times daily - Continue hydralazine  50 mg 3 times daily

## 2024-07-25 NOTE — Assessment & Plan Note (Addendum)
 Chronic diffuse muscle pain and spasms; did not follow up with prior PT referral.  Endorses chronic intermittent band-like headaches consistent with tension-type headaches. Previously treated with Toradol  in the ED, which is not ideal for this type of headache. Reports GI upset with acetaminophen  use, and NSAID use.  Plan: -Referred to physical therapy for evaluation and treatment. - Dilaudid  2 mg every 12 hours PRN. -

## 2024-07-30 ENCOUNTER — Other Ambulatory Visit: Payer: Self-pay | Admitting: *Deleted

## 2024-07-30 NOTE — Patient Instructions (Signed)
 Visit Information  Thank you for taking time to visit with me today. Please don't hesitate to contact me if I can be of assistance to you before our next scheduled appointment.  Your next care management appointment is by telephone on 08/27/2024 at 10:30 AM  Please call the care guide team at [phone] if you need to cancel, schedule, or reschedule an appointment.   Please call the Suicide and Crisis Lifeline: 988 call the [place]  National Suicide Prevention Lifeline: (709)151-1864 or TTY: (661)282-0273 TTY 581-632-2756) to talk to a trained counselor call 1-800-273-TALK (toll free, 24 hour hotline) if you are experiencing a Mental Health or Behavioral Health Crisis or need someone to talk to.  Olam Ku, RN, BSN [place]  [facility], [facility] RN Care Manager Direct Dial : [phone]  Fax: [phone]
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
## 2024-07-31 DIAGNOSIS — N186 End stage renal disease: Secondary | ICD-10-CM | POA: Diagnosis not present

## 2024-08-01 ENCOUNTER — Ambulatory Visit
Admission: RE | Admit: 2024-08-01 | Discharge: 2024-08-01 | Disposition: A | Discharge status: Home / Self Care | Source: Ambulatory Visit | Attending: Internal Medicine | Admitting: Internal Medicine

## 2024-08-01 DIAGNOSIS — Z1231 Encounter for screening mammogram for malignant neoplasm of breast: Secondary | ICD-10-CM

## 2024-08-01 NOTE — Progress Notes (Signed)
 Internal Medicine Clinic Attending  Case discussed with the resident at the time of the visit.  We reviewed the resident's history and exam and pertinent patient test results.  I agree with the assessment, diagnosis, and plan of care documented in the resident's note.

## 2024-08-02 ENCOUNTER — Other Ambulatory Visit

## 2024-08-02 ENCOUNTER — Telehealth: Payer: Self-pay

## 2024-08-02 NOTE — Telephone Encounter (Signed)
 Attempted to contact patient for scheduled appointment for medication management. Left HIPAA compliant message for patient to return my call at their convenience.   Lorain Baseman, PharmD [facility] [phone]

## 2024-08-02 NOTE — Progress Notes (Deleted)
 08/02/2024 Name: Audrey Hill MRN: [id number] DOB: 1956/01/06  No chief complaint on file.   Audrey Hill is a 68 y.o. year old female who presented for a telephone visit.   They were referred to the pharmacist by their PCP for assistance in managing complex medication management and polypharmacy. PMH includes HTN, Afib, HFrEF, pulmonary HTN, asthma, GERD, ESRD on dialysis, HLD, MDD, chronic pain.   Subjective: Patient was seen by Melvenia Morrison, MD, on 05/04/24 for an acute viral illness. She was given toradol  in office and instructed to increase trazodone  to 100 mg nightly for insomnia (she was prescribed a 50 mg tablet to take in addition to the 50 mg tablets included in her pill packs).  She was engaged by pharmacy via telephone on 05/28/24. Assisted in adding higher dose of hydralazine  50 mg TID to her pill packs from [facility]. Patient was seen by Dr. Libby Blanch on 06/28/24. BP was 180/79 mmHg. Trazodone  was switched to mirtazapine  for insomnia. No changes to BP meds were made given upcoming dialysis appt. Following this, patient has had multiple ED visits and an admission at [facility] for URI and pleuritic CP (likely due to pulmonary edema). Improved with dialysis. On 07/10/24, she presented to [facility] ED from dialysis with ShOB. Discharged with prednisone  40 mg daily x 5 days. Returned to ED with continued CP and cough on 07/14/24. She was seen by Dr. Celestina on 07/25/24. BP was 144/64 mmHg, HR 81. She continued to report insomnia. Did not tolerate mirtazapine  as it made her leg hurt. Dose was reduced to 7.5 mg at bedtime. Also reporting diffuse muscle spasms. She was referred to physical therapy, continued hydromorphone  2 mg Q12H PRN.   BP 111/60 at recent dialysis appt (post-dialysis)  Today, patient reports doing ok. *** Schedule with PCP now (1 mo f/u at 06/28/24) Was not able to show for PFTs?  A-fib: Amiodarone  200 mg  Hyperlipidemia: Lipitor  40 mg daily  Constipation:  MiraLAX  17 mg twice daily, senna 2 mg daily ESRD: Calcium  acetate 3 times daily, sevelamer  800 mg 3 times daily, Renavite tabs HFrEF: Lasix  80 mg daily Pain:  Voltaren  gel, lidocaine  patch, Dilaudid  2 mg every 12 hours as needed Iron  deficiency anemia: Ferrous sulfate 325 mg every other day, Mircera  Hypertension: Hydralazine  50 mg 3 times daily, isosorbide  dinitrate 20 mg 3 times daily, losartan  50 mg daily Chronic pain: Dilaudid  2 mg every 8 hours as needed Insomnia: Mirtazapine  15 mg nightly GERD: Protonix  40 mg daily Depression: Zoloft  25 mg daily Hyperlipidemia: Lipitor 20 mg daily Reactive airway disease: Albuterol   Care Team: Primary Care Provider: Blanch Libby, DO ; needs to be scheduled ~ Jan 2026  Medication Access/Adherence  Current Pharmacy:  [id number] - [place], [place] - [address] [place] [address] Phone: [phone] Fax: [phone]  [facility] - [place]  - [name] - [address] [place] [address] Phone: [phone] Fax: [phone]  [id number] - [place], [place] - [address] [place] [address] Phone: [phone] Fax: [phone]   Patient reports affordability concerns with their medications: No  - [employer] Dual Complete Patient reports access/transportation concerns to their pharmacy: No  Patient reports adherence concerns with their medications:  No  - reports the only medication she wants to make sure  she can refill is her pain  medication  Medication Management:  Current adherence strategy: [facility] pill packs  Called [facility] to confirm what all was in her most recent pill pack - Amiodarone  200 mg daily - Atorvastatin  40 mg daily - Furosemide  80 mg daily - Isosorbide  dinitrate 20 mg TID - Hydralazine  50 mg TID - Losartan  50 mg daily - Pantoprazole  40  mg daily - Rena-vites - Sertraline  25 mg daily - trazodone  50 mg (reports that she is taking additional trazodone  50 mg as prescribed, 100 mg total)  [facility] also filled:  - Albuterol  inhaler - Hydralazine  25 mg (in a bottle) - written for 15 day supply (2 tabs TID) and needs to be 30 day supply for it to go in packs. Patient reports she is taking one tab (25 mg) TID. - Filled carvedilol  last month, then was put on hold (3.125 mg). Patient advised to discontinue due to hypotension on 05/02/24.  Sevelamer  last filled 11/30/23 for 30ds from [facility]  Patient reports Good adherence to medications  Hypertension/HFrEF (EF 30-35%, Grade III diastolic dysfunction with severe LVH, normal RV, bi-atrial enlargement in Oct 2022):  Current medications: hydralazine  50 mg TID (patient taking 25 mg TID), isosorbide  dinitrate 20 mg TID, losartan  50 mg daily Medications previously tried: carvedilol  - hypotension  Patient does not have a validated, automated, upper arm home BP cuff Current blood pressure readings readings: last BP at dialysis on 05/22/24 was 142/86  Patient denies hypotensive s/sx including dizziness, lightheadedness.  Patient denies hypertensive symptoms including headache, chest pain, shortness of breath   Objective:  BP Readings from Last 3 Encounters:  07/25/24 (!) 144/64  07/11/24 (!) 160/69  06/28/24 (!) 169/79    Lab Results  Component Value Date   HGBA1C 5.6 08/03/2021   HGBA1C 5.7 (H) 08/23/2011       Latest Ref Rng & Units 07/10/2024    1:51 PM 06/28/2024    2:40 PM 06/09/2024    4:33 PM  BMP  Glucose 70 - 99 mg/dL 892  87  898   BUN 8 - 23 mg/dL 41  27  21   Creatinine 0.44 - 1.00 mg/dL 2.48  4.55  6.19   Sodium 135 - 145 mmol/L 136  136  138   Potassium 3.5 - 5.1 mmol/L 4.3  3.8  4.4   Chloride 98 - 111 mmol/L 97  95  106   CO2 22 - 32 mmol/L 26  25  22    Calcium  8.9 - 10.3 mg/dL 8.8  8.5  9.0     Lab Results  Component Value Date   CHOL 113 08/05/2022    HDL 72 08/05/2022   LDLCALC 34 08/05/2022   TRIG 33 08/05/2022   CHOLHDL 1.6 08/05/2022    Medications Reviewed Today   Medications were not reviewed in this encounter       Assessment/Plan:   Heart Failure: - Currently appropriately managed. Patient is not having s/sx of hypotension since discontinuing carvedilol . She is taking a lower dose of hydralazine  than prescribed. BP at dialysis are slightly elevated. Would recommend increasing hydralazine  to 50 mg TID as prescribed. - Patient has not been able to access a blood pressure cuff and is not currently monitoring at home - Weight gain is monitored and managed at dialysis - Recommend to continue current regimen  Current medications:  ACEi/ARB/ARNI: losartan  50 mg daily SGLT2i: none - ESRD Beta blocker: discontinued carvedilol  due to hypotension Mineralocorticoid Receptor Antagonist: none- ESRD Diuretic regimen:  furosemide  80 mg daily Hydral/nitrate: hydralazine  50 mg TID, isosorbide  dinitrate 20 mg TID   Medication Management: - Currently strategy sufficient to maintain appropriate adherence to prescribed medication regimen - Will collaborate with PCP to update Rx for hydralazine  so that it will be included in next pack   Patient verbalized understanding of treatment plan.    Follow Up Plan:  Pharmacist 07/23/24 PCP clinic visit needs to be scheduled ~ Feb 2026   Lorain Baseman, PharmD [facility] [phone]

## 2024-08-05 DIAGNOSIS — J9612 Chronic respiratory failure with hypercapnia: Secondary | ICD-10-CM | POA: Diagnosis not present

## 2024-08-06 ENCOUNTER — Ambulatory Visit: Admitting: Orthopaedic Surgery

## 2024-08-08 ENCOUNTER — Other Ambulatory Visit: Payer: Self-pay | Admitting: Student

## 2024-08-09 NOTE — Telephone Encounter (Signed)
 Medication sent to pharmacy

## 2024-08-10 ENCOUNTER — Other Ambulatory Visit: Payer: Self-pay | Admitting: *Deleted

## 2024-08-10 ENCOUNTER — Telehealth: Payer: Self-pay | Admitting: *Deleted

## 2024-08-10 ENCOUNTER — Ambulatory Visit: Payer: Self-pay | Admitting: *Deleted

## 2024-08-10 DIAGNOSIS — G894 Chronic pain syndrome: Secondary | ICD-10-CM

## 2024-08-10 NOTE — Telephone Encounter (Signed)
 Copied from CRM #[id number]. Topic: Clinical - Order For Equipment >> Aug 10, 2024  9:28 AM Alfonso ORN wrote: Reason for CRM: [facility] calling from [employer] dural  medical equipment checking on status of the  written order was faxed  on 07/31/24  need to verify patient provider that handle patient oxygen and  need sign by Camellia Eastern , per is [place] [facility] [place], Libby is not pecos certificate [employer] will followup with this request within 3 business days

## 2024-08-10 NOTE — Telephone Encounter (Signed)
 FYI Only or Action Required?: Action required by provider: medication refill request.  Patient was last seen in primary care on 07/25/2024 by Celestina Czar, MD.  Called Nurse Triage reporting Leg Pain and Medication Refill.  Symptoms began chronic pain.  Interventions attempted: HYDROmorphone  (DILAUDID ) 2 MG tablet).  Symptoms are: unchanged.  Triage Disposition: Call PCP When Office is Open  Patient/caregiver understands and will follow disposition?: yes   Copied from CRM #[id number]. Topic: Clinical - Red Word Triage >> Aug 10, 2024 11:32 AM Alfonso ORN wrote: Red Word that prompted transfer to Nurse Triage: patient having a  hard time walking in severe pain in both legs and left leg is getting worse  (pt. originally call to get her pain medication for the HYDROmorphone  (DILAUDID ) 2 MG tablet)   ----------------------------------------------------------------------- From previous Reason for Contact - Medication Refill: Medication:   Has the patient contacted their pharmacy?   (Agent: If no, request that the patient contact the pharmacy for the refill. If patient does not wish to contact the pharmacy document the reason why and proceed with request.) (Agent: If yes, when and what did the pharmacy advise?)  This is the patient's preferred pharmacy:  [id number] - [place], [place] - [address] [place] [address] Phone: [phone] Fax: [phone]  [facility] - [place]  - [name] - [address] [place] [address] Phone: [phone] Fax: [phone]  [id number] - [place], [place] - [address] [place] [address] Phone: [phone] Fax: [phone]  Is this the correct pharmacy for this prescription?   If no, delete pharmacy and type the correct one.   Has the prescription been filled recently?    Is  the patient out of the medication?    Has the patient been seen for an appointment in the last year OR does the patient have an upcoming appointment?    Can we respond through MyChart?    Agent: Please be advised that Rx refills may take up to 3 business days. We ask that you follow-up with your pharmacy. Reason for Disposition  Caller requesting a CONTROLLED substance prescription refill (e.g., narcotics, ADHD medicines)  Answer Assessment - Initial Assessment Questions 1. ONSET: When did the pain start?      Chronic pain in legs 2. LOCATION: Where is the pain located?      Legs worse 3. PAIN: How bad is the pain?    (Scale 1-10; or mild, moderate, severe)     severe 4. WORK OR EXERCISE: Has there been any recent work or exercise that involved this part of the body?      no 5. CAUSE: What do you think is causing the leg pain?     Out of medication- last dose this morning 6. OTHER SYMPTOMS: Do you have any other symptoms? (e.g., chest pain, back pain, breathing difficulty, swelling, rash, fever, numbness, weakness)     Patient states she has pain all over- legs and back  Patient uses twice daily- she is completely out now.  Answer Assessment - Initial Assessment Questions 1. DRUG NAME: What medicine do you need to have refilled?     HYDROmorphone  (DILAUDID ) 2 MG tablet) 2. REFILLS REMAINING: How many refills are remaining? Notes: The label on the medicine or pill bottle will show how many refills are remaining. If there are no  refills remaining, then a renewal may be needed.     none  4. PRESCRIBER: Who prescribed it? Note: The prescribing doctor or group is responsible for refill approvals.SABRA     PCP  6. SYMPTOMS: Do you have any symptoms?     Leg/back pain Patient has been informed of refill protocol- she wants to speak with someone in office  Protocols used: Leg Pain-A-AH, Medication Refill and Renewal Call-A-AH

## 2024-08-10 NOTE — Telephone Encounter (Signed)
 Pt requesting f/u on rx request. States she cannot come in for an appointment and does not have transportation. Advised that she may seek care in the ED via EMS if symptoms worsen or if she wishes to be seen. Requesting call back from office.

## 2024-08-11 ENCOUNTER — Telehealth: Payer: Self-pay | Admitting: Student

## 2024-08-11 DIAGNOSIS — M791 Myalgia, unspecified site: Secondary | ICD-10-CM

## 2024-08-11 MED ORDER — HYDROMORPHONE HCL 2 MG PO TABS: 2.0000 mg | ORAL_TABLET | Freq: Two times a day (BID) | ORAL | 0 refills | Status: DC | PRN

## 2024-08-11 NOTE — Telephone Encounter (Signed)
 Received after hours call for controlled substance refill.  Patient takes hydromorphone  2 mg q 12 h prn for severe chronic pain. She is due for refill today and is out of medicine, having taken her last dose yesterday. PDMP corroborates, only our office prescribing.  Meds ordered this encounter  Medications   HYDROmorphone  (DILAUDID ) 2 MG tablet    Sig: Take 1 tablet (2 mg total) by mouth every 12 (twelve) hours as needed for severe pain (pain score 7-10).    Dispense:  60 tablet    Refill:  0    Rx 1/3 30 day Rx   She was encouraged to call or message PCP a few days in advance of her need for refills and discouraged from using after-hours line for refills of controlled substances.  Ozell Kung MD 08/11/2024, 5:47 AM

## 2024-08-13 NOTE — Telephone Encounter (Signed)
 RTC to patient states that she received her pain medication.  Feeling a little better.  Would now like to get a refill on her pain patches.

## 2024-08-14 MED ORDER — LIDOCAINE 5 % EX PTCH: 1.0000 | MEDICATED_PATCH | Freq: Every day | CUTANEOUS | 2 refills | Status: DC | PRN

## 2024-08-14 NOTE — Addendum Note (Signed)
 Addended by: Gaetana Kawahara on: 08/14/2024 03:32 PM   Modules accepted: Orders

## 2024-08-15 ENCOUNTER — Telehealth: Payer: Self-pay

## 2024-08-15 NOTE — Telephone Encounter (Signed)
 Return call to patient.  Patient needs to get her Surgical Clearance Forms completed for her hip surgery.  Will have the Dialysis SW fax over the form for completion so that it can be sent to the Surgeon.  Patient was last here on 07/25/2024.Audrey Hill

## 2024-08-15 NOTE — Telephone Encounter (Signed)
 Copied from CRM [id number]. Topic: Clinical - Medical Advice >> Aug 15, 2024  3:16 PM Mercer PEDLAR wrote: Reason for CRM: Patient is requesting a callback because she has questions regarding her upcoming hip replacement surgery.

## 2024-08-16 ENCOUNTER — Telehealth: Payer: Self-pay | Admitting: Student

## 2024-08-16 ENCOUNTER — Telehealth (HOSPITAL_BASED_OUTPATIENT_CLINIC_OR_DEPARTMENT_OTHER): Payer: Self-pay | Admitting: *Deleted

## 2024-08-16 NOTE — Telephone Encounter (Signed)
 Called and informed pt Hydromorphone  rx was sent to the pharmacy on 11/8; pt stated she picked it up. She wanted to be sure it will be ready next month; informed her to call 2-3 days ahead of next refill; she stated this was what the doctor told her.

## 2024-08-16 NOTE — Telephone Encounter (Signed)
 Message has been forwarded to [facility] to f/u.  Copied from CRM #[id number]. Topic: General - Other >> Aug 16, 2024 11:40 AM Debby BROCKS wrote: Reason for CRM: Patient states that she will be faxing over surgery clearance that needs to be filled out and sent over. She does not know the fax # it needs to be sent to but stated all the information will be on the form itself that she is sending

## 2024-08-16 NOTE — Telephone Encounter (Signed)
 Message has been forward to triage in reference to medication and Hme also sent message in reference to be on the lookout for paperwork.  Copied from CRM #[id number]. Topic: Appointments - Scheduling Inquiry for Clinic >> Aug 15, 2024  3:20 PM Mercer PEDLAR wrote: Reason for CRM: Patient would like to know if she will need to be seen before her next refill for HYDROmorphone  (DILAUDID ) 2 MG tablet.  Patient also stated that she is having paperwork faxed to clinic which she will need completed and returned to fax number provided on paperwork. Fax number was confirmed.

## 2024-08-16 NOTE — Telephone Encounter (Signed)
   Pre-operative Risk Assessment    Patient Name: Audrey Hill  DOB: 13-Dec-1955 MRN: [id number]   Date of last office visit: 03/12/2024 Date of next office visit: None  Request for Surgical Clearance    Procedure:  LTHA  Date of Surgery:  Clearance TBD                                 Surgeon:  Dr. Redell Shoals Surgeon's Group or Practice Name:  [facility] Phone number:  [phone] Fax number:  [phone]   Type of Clearance Requested:   - Medical    Type of Anesthesia:  Spinal   Additional requests/questions:    Signed, Edsel Grayce Sanders   08/16/2024, 2:59 PM

## 2024-08-17 NOTE — Telephone Encounter (Signed)
   Name: Audrey Hill  DOB: 1956-06-07  MRN: [id number]  Primary Cardiologist: None  Chart reviewed as part of pre-operative protocol coverage. She was last seen by Dr. Court in 03/2024. However, she has had multiple ED visits and a hospitalization since last visit. Therefore, she will require a follow-up in-office visit in order to better assess preoperative cardiovascular risk.  Pre-op covering staff: - Please schedule appointment and call patient to inform them. If patient already had an upcoming appointment within acceptable timeframe, please add pre-op clearance to the appointment notes so provider is aware. - Please contact requesting surgeon's office via preferred method (i.e, phone, fax) to inform them of need for appointment prior to surgery.  Kenley Troop E Chanz Cahall, PA-C  08/17/2024, 9:52 AM

## 2024-08-17 NOTE — Telephone Encounter (Signed)
 Called patient to schedule an office appointment for a pre-op clearance on 09/12/24 @1 :55, with Damien Braver, NP

## 2024-08-21 NOTE — Telephone Encounter (Signed)
 Forwarding message to [place].

## 2024-08-27 ENCOUNTER — Other Ambulatory Visit: Payer: Self-pay | Admitting: *Deleted

## 2024-08-27 NOTE — Patient Instructions (Signed)
 Visit Information  Thank you for taking time to visit with me today. Please don't hesitate to contact me if I can be of assistance to you before our next scheduled appointment.  Your next care management appointment is by telephone on 12/22/20205 at 1000 am  Please call the care guide team at [phone] if you need to cancel, schedule, or reschedule an appointment.   Please  if you are experiencing a Mental Health or Behavioral Health Crisis or need someone to talk to.  Olam Ku, RN, BSN [place]  [facility], [facility] RN Care Manager Direct Dial : [phone]  Fax: [phone]
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
## 2024-09-05 ENCOUNTER — Telehealth: Payer: Self-pay

## 2024-09-05 ENCOUNTER — Other Ambulatory Visit: Payer: Self-pay

## 2024-09-05 DIAGNOSIS — M791 Myalgia, unspecified site: Secondary | ICD-10-CM

## 2024-09-05 MED ORDER — HYDROMORPHONE HCL 2 MG PO TABS: 2.0000 mg | ORAL_TABLET | Freq: Two times a day (BID) | ORAL | 0 refills | Status: DC | PRN

## 2024-09-05 NOTE — Telephone Encounter (Addendum)
 I called pt to sch her an appt to discuss about medical clearance from [facility], pt states she is in the hospital. She will call back when she is discharge from the hospital. Medical clearance form is in the red team box under miscellaneous. Advised Audrey Hill to call back if she needs further assistance.

## 2024-09-07 ENCOUNTER — Other Ambulatory Visit: Payer: Self-pay | Admitting: Student

## 2024-09-07 DIAGNOSIS — I10 Essential (primary) hypertension: Secondary | ICD-10-CM

## 2024-09-07 DIAGNOSIS — E785 Hyperlipidemia, unspecified: Secondary | ICD-10-CM

## 2024-09-11 NOTE — Telephone Encounter (Signed)
 Medication sent to pharmacy

## 2024-09-12 ENCOUNTER — Ambulatory Visit: Admitting: Nurse Practitioner

## 2024-09-12 ENCOUNTER — Ambulatory Visit: Attending: Cardiovascular Disease | Admitting: Cardiovascular Disease

## 2024-09-12 ENCOUNTER — Encounter: Payer: Self-pay | Admitting: Cardiovascular Disease

## 2024-09-12 ENCOUNTER — Other Ambulatory Visit: Payer: Self-pay | Admitting: Student

## 2024-09-12 VITALS — BP 138/62 | HR 76 | Ht 66.0 in | Wt 150.0 lb

## 2024-09-12 DIAGNOSIS — G894 Chronic pain syndrome: Secondary | ICD-10-CM

## 2024-09-12 DIAGNOSIS — I10 Essential (primary) hypertension: Secondary | ICD-10-CM

## 2024-09-12 DIAGNOSIS — Z01810 Encounter for preprocedural cardiovascular examination: Secondary | ICD-10-CM | POA: Diagnosis not present

## 2024-09-12 DIAGNOSIS — I480 Paroxysmal atrial fibrillation: Secondary | ICD-10-CM | POA: Diagnosis not present

## 2024-09-12 DIAGNOSIS — I5020 Unspecified systolic (congestive) heart failure: Secondary | ICD-10-CM | POA: Diagnosis not present

## 2024-09-12 DIAGNOSIS — Z01818 Encounter for other preprocedural examination: Secondary | ICD-10-CM | POA: Insufficient documentation

## 2024-09-12 DIAGNOSIS — R0989 Other specified symptoms and signs involving the circulatory and respiratory systems: Secondary | ICD-10-CM

## 2024-09-12 DIAGNOSIS — E782 Mixed hyperlipidemia: Secondary | ICD-10-CM | POA: Diagnosis not present

## 2024-09-12 NOTE — Patient Instructions (Signed)
 Medication Instructions:  Your physician recommends that you continue on your current medications as directed. Please refer to the Current Medication list given to you today.  *If you need a refill on your cardiac medications before your next appointment, please call your pharmacy*  Testing/Procedures: Your physician has requested that you have a carotid duplex. This test is an ultrasound of the carotid arteries in your neck. It looks at blood flow through these arteries that supply the brain with blood. Allow one hour for this exam. There are no restrictions or special instructions. This will take place at [address], 4th floor  Please note: We ask at that you not bring children with you during ultrasound (echo/ vascular) testing. Due to room size and safety concerns, children are not allowed in the ultrasound rooms during exams. Our front office staff cannot provide observation of children in our lobby area while testing is being conducted. An adult accompanying a patient to their appointment will only be allowed in the ultrasound room at the discretion of the ultrasound technician under special circumstances. We apologize for any inconvenience.   Follow-Up: At [facility], you and your health needs are our priority.  As part of our continuing mission to provide you with exceptional heart care, our providers are all part of one team.  This team includes your primary Cardiologist (physician) and Advanced Practice Providers or APPs (Physician Assistants and Nurse Practitioners) who all work together to provide you with the care you need, when you need it.  Your next appointment:   12 month(s)  Provider:   Lauro Portal, MD    We recommend signing up for the patient portal called "MyChart".  Sign up information is provided on this After Visit Summary.  MyChart is used to connect with patients for Virtual Visits (Telemedicine).  Patients are able to view lab/test results, encounter  notes, upcoming appointments, etc.  Non-urgent messages can be sent to your provider as well.   To learn more about what you can do with MyChart, go to [email or web address].

## 2024-09-12 NOTE — Progress Notes (Signed)
 09/12/2024 Audrey Hill   08/29/1956  [id number]  Primary Physician [name], DO Primary Cardiologist: [name] [name] [name], [name]  HPI:  Audrey Hill is a 68 y.o.   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  She is referred by her PCP, Dr. Tobie, for cardiovascular evaluation.  I last saw her in the office 03/12/2024.  Risk factors include treated hypertension hyperlipidemia.  Apparently her mother and sister both had myocardial infarction's.  She has never had a heart attack or stroke.  She has been on hemodialysis for a year.  She walks with a walker because of weakness and pain in her legs.  She has been seen by Dr. Lanis, vascular surgery for steal syndrome and has undergone upper extremity angiography.  She had a cardiac PET study performed 02/20/2024 that revealed an EF in the 30% range without perfusion abnormalities.  She wore a monitor that showed 1 episode of nonsustained ventricular tachycardia up to 10 episodes of atrial tachycardia.  A 2D echo revealed an EF in the 35% range with severe left ventricular enlargement and moderate pulmonary hypertension.  She lives at home with her grandson and she is minimally ambulatory.  Since I saw her 6 months ago she did have a 2D echo performed at [facility] 07/05/2024 revealed an EF of 40 to 45% with LV dilatation.  This represents an improvement since her last 2D echo of 35 to 40% EF 10/10/2023.  She was on a beta-blocker in the past which apparently was discontinued.  She denies chest pain or shortness of breath.   Current Meds  Medication Sig   albuterol  (VENTOLIN  HFA) 108 (90 Base) MCG/ACT inhaler INHALE 2 PUFFS BY MOUTH EVERY 6 HOURS AS NEEDED FOR COUGH   amiodarone  (PACERONE ) 200 MG tablet TAKE 1 TABLET BY MOUTH DAILY   atorvastatin  (LIPITOR) 40 MG tablet TAKE 1 TABLET BY MOUTH DAILY   diclofenac  Sodium  (VOLTAREN ) 1 % GEL Apply 4 g topically 4 (four) times daily.   ferrous sulfate 325 (65 FE) MG tablet Take 325 mg by mouth every other day.   furosemide  (LASIX ) 80 MG tablet Take 1 tablet (80 mg total) by mouth daily.   hydrALAZINE  (APRESOLINE ) 50 MG tablet Take 1 tablet (50 mg total) by mouth 3 (three) times daily.   HYDROmorphone  (DILAUDID ) 2 MG tablet Take 1 tablet (2 mg total) by mouth every 12 (twelve) hours as needed for severe pain (pain score 7-10).   isosorbide  dinitrate (ISORDIL ) 20 MG tablet TAKE 1 TABLET (20 MG TOTAL) BY MOUTH 3 (THREE) TIMES DAILY.   lidocaine  (LIDODERM ) 5 % Place 1 patch onto the skin daily as needed. Remove & Discard patch within 12 hours or as directed by MD   losartan  (COZAAR ) 50 MG tablet TAKE 1 TABLET BY MOUTH DAILY   Methoxy PEG-Epoetin Beta (MIRCERA IJ) 100 mcg.   mirtazapine  (REMERON ) 15 MG tablet Take 0.5 tablets (7.5 mg total) by mouth at bedtime.   multivitamin (RENA-VIT) TABS tablet TAKE 1 TABLET BY MOUTH DAILY.   pantoprazole  (PROTONIX ) 40 MG tablet TAKE 1 TABLET BY MOUTH EVERY DAY   polyethylene glycol (MIRALAX  / GLYCOLAX ) 17 g packet Take 17 g by mouth daily.   senna (SENOKOT) 8.6 MG TABS tablet Take 2 tablets by mouth daily as needed for mild constipation.   sertraline  (ZOLOFT ) 25 MG tablet Take 1 tablet (25 mg  total) by mouth daily.   sevelamer  carbonate (RENVELA ) 800 MG tablet Take 1 tablet (800 mg total) by mouth 3 (three) times daily with meals.   triamcinolone (KENALOG) 0.025 % ointment Apply 1 Application topically daily as needed (shin).     Allergies  Allergen Reactions   Diphenhydramine  Hcl     Other Reaction(s): Other (See Comments)  mini stroke    Other reaction(s): Other (See Comments) mini stroke (Benadryl )   Lisinopril Rash and Swelling   Metoclopramide      Other reaction(s): Other Patient has dystonia with administration of drug.  Other reaction(s): Other (See Comments) Patient has dystonia with administration of drug.   Patient has dystonia with administration of drug.     Nortriptyline     Other reaction(s): Other (See Comments) unknown Mini stroke    Tramadol Nausea And Vomiting        Nitroglycerin  Hives and Rash   Tylenol  [Acetaminophen ] Rash and Other (See Comments)    Tolerates percocet     Ibuprofen Other (See Comments)    Upset gi   Norvasc  [Amlodipine  Besylate] Swelling   Nsaids Other (See Comments)    GI upset    Social History   Socioeconomic History   Marital status: Single    Spouse name: Not on file   Number of children: Not on file   Years of education: Not on file   Highest education level: Not on file  Occupational History   Occupation: Unemplyed    Employer: RETIRED  Tobacco Use   Smoking status: Former    Current packs/day: 0.00    Average packs/day: 0.5 packs/day for 40.0 years (20.0 ttl pk-yrs)    Types: Cigarettes    Start date: 10/04/1973    Quit date: 10/04/2013    Years since quitting: 10.9   Smokeless tobacco: Never   Tobacco comments:    form given 06/18/13  Vaping Use   Vaping status: Never Used  Substance and Sexual Activity   Alcohol  use: No   Drug use: No   Sexual activity: Not Currently  Other Topics Concern   Not on file  Social History Narrative   Pt lives with son and brother. Home has 3 stairs into it. No issue. Has HS degree.    Social Drivers of Corporate Investment Banker Strain: Not on file  Food Insecurity: Low Risk (09/05/2024)   Received from [facility]   Hunger Vital Sign    Within the past 12 months, you worried that your food would run out before you got money to buy more: Never true    Within the past 12 months, the food you bought just didn't last and you didn't have money to get more. : Never true  Transportation Needs: No Transportation Needs (09/05/2024)   Received from [facility]    In the past 12 months, has lack of reliable transportation kept you from medical appointments, meetings, work or  from getting things needed for daily living? : No  Physical Activity: Not on file  Stress: Not on file  Social Connections: Socially Isolated (02/29/2024)   Social Connection and Isolation Panel    Frequency of Communication with Friends and Family: Three times a week    Frequency of Social Gatherings with Friends and Family: Three times a week    Attends Religious Services: Never    Active Member of Clubs or Organizations: No    Attends Banker Meetings: Never    Marital Status:  Never married  Intimate Partner Violence: Not At Risk (08/27/2024)   Humiliation, Afraid, Rape, and Kick questionnaire    Fear of Current or Ex-Partner: No    Emotionally Abused: No    Physically Abused: No    Sexually Abused: No     Review of Systems: General: negative for chills, fever, night sweats or weight changes.  Cardiovascular: negative for chest pain, dyspnea on exertion, edema, orthopnea, palpitations, paroxysmal nocturnal dyspnea or shortness of breath Dermatological: negative for rash Respiratory: negative for cough or wheezing Urologic: negative for hematuria Abdominal: negative for nausea, vomiting, diarrhea, bright red blood per rectum, melena, or hematemesis Neurologic: negative for visual changes, syncope, or dizziness All other systems reviewed and are otherwise negative except as noted above.    Blood pressure 138/62, pulse 76, height 5' 6 (1.676 m), weight 150 lb (68 kg), SpO2 91%.  General appearance: alert and no distress Neck: no adenopathy, no JVD, supple, symmetrical, trachea midline, thyroid  not enlarged, symmetric, no tenderness/mass/nodules, and soft bilateral carotid bruits Lungs: clear to auscultation bilaterally Heart: regular rate and rhythm, S1, S2 normal, no murmur, click, rub or gallop Extremities: extremities normal, atraumatic, no cyanosis or edema Pulses: 2+ and symmetric Skin: Skin color, texture, turgor normal. No rashes or lesions Neurologic:  Grossly normal  EKG EKG Interpretation Date/Time:  Wednesday September 12 2024 11:13:21 EST Ventricular Rate:  76 PR Interval:  156 QRS Duration:  124 QT Interval:  452 QTC Calculation: 508 R Axis:   -41  Text Interpretation: Sinus rhythm with Premature supraventricular complexes Left axis deviation Left ventricular hypertrophy with QRS widening and repolarization abnormality ( R in aVL , Cornell product , Romhilt-Estes ) When compared with ECG of 10-Jul-2024 13:44, Premature supraventricular complexes are now Present Minimal criteria for Septal infarct are no longer Present Confirmed by Court Carrier [phone]) on 09/12/2024 11:28:50 AM    ASSESSMENT AND PLAN:   Hypertension History of essential hypertension blood pressure measured today at 138/62.  She is on BiDil and lisinopril.  Hyperlipidemia History of hyperlipidemia on statin therapy lipid profile performed 08/05/2022 revealing total cholesterol 113, LDL 34 and HDL 72.  Atrial fibrillation (HCC) History of PAF maintaining sinus rhythm on amiodarone  not on oral anticoagulation.  HFrEF (heart failure with reduced ejection fraction) (HCC) History of heart failure with reduced EF.  Her EF by echo 10/10/2023 was 35 to 40%.  Her most recent echo performed at [facility] 07/05/2024 was 40 to 45% with a severely dilated left ventricle.  She does have a history of a nonischemic cardiac PET study 02/14/2024 suggesting a nonischemic cardiomyopathy.  She is on BiDil and lisinopril, not on a beta-blocker.  I am going to refer her to the advanced heart failure clinic for medication optimization.  Preoperative clearance Patient apparently needs a right total hip replacement by Dr. Fidel.  She is here for preoperative clearance.  She has had a negative cardiac PET study 6 months ago and an EF of 40 to 45% by 2D echo suggesting a nonischemic cardiomyopathy.  She appears to be compensated.  She is on dialysis.  I am clearing her at low  risk.     Carrier DOROTHA Court MD FACP,FACC,FAHA, [facility] 09/12/2024 11:46 AM

## 2024-09-12 NOTE — Assessment & Plan Note (Signed)
 History of hyperlipidemia on statin therapy lipid profile performed 08/05/2022 revealing total cholesterol 113, LDL 34 and HDL 72.

## 2024-09-12 NOTE — Assessment & Plan Note (Signed)
 History of essential hypertension blood pressure measured today at 138/62.  She is on BiDil and lisinopril.

## 2024-09-12 NOTE — Assessment & Plan Note (Signed)
 Patient apparently needs a right total hip replacement by Dr. Fidel.  She is here for preoperative clearance.  She has had a negative cardiac PET study 6 months ago and an EF of 40 to 45% by 2D echo suggesting a nonischemic cardiomyopathy.  She appears to be compensated.  She is on dialysis.  I am clearing her at low risk.

## 2024-09-12 NOTE — Telephone Encounter (Unsigned)
 Copied from CRM #[id number]. Topic: Clinical - Medication Refill >> Sep 12, 2024  1:30 PM Shamecia H wrote: Medication: lidocaine  (LIDODERM ) 5 %  Has the patient contacted their pharmacy? Yes (Agent: If no, request that the patient contact the pharmacy for the refill. If patient does not wish to contact the pharmacy document the reason why and proceed with request.) (Agent: If yes, when and what did the pharmacy advise?)  This is the patient's preferred pharmacy:  [id number] - [place], [place] - [address] [place] [address] Phone: [phone] Fax: [phone]  Is this the correct pharmacy for this prescription? Yes If no, delete pharmacy and type the correct one.   Has the prescription been filled recently? Yes  Is the patient out of the medication? Yes  Has the patient been seen for an appointment in the last year OR does the patient have an upcoming appointment? Yes  Can we respond through MyChart? Yes  Agent: Please be advised that Rx refills may take up to 3 business days. We ask that you follow-up with your pharmacy.

## 2024-09-12 NOTE — Assessment & Plan Note (Signed)
 History of PAF maintaining sinus rhythm on amiodarone  not on oral anticoagulation.

## 2024-09-12 NOTE — Assessment & Plan Note (Signed)
 History of heart failure with reduced EF.  Her EF by echo 10/10/2023 was 35 to 40%.  Her most recent echo performed at [facility] 07/05/2024 was 40 to 45% with a severely dilated left ventricle.  She does have a history of a nonischemic cardiac PET study 02/14/2024 suggesting a nonischemic cardiomyopathy.  She is on BiDil and lisinopril, not on a beta-blocker.  I am going to refer her to the advanced heart failure clinic for medication optimization.

## 2024-09-13 MED ORDER — LIDOCAINE 5 % EX PTCH: 1.0000 | MEDICATED_PATCH | Freq: Every day | CUTANEOUS | 2 refills | Status: DC | PRN

## 2024-09-14 ENCOUNTER — Ambulatory Visit

## 2024-09-14 VITALS — BP 126/60 | HR 81 | Temp 98.9°F | Resp 28 | Ht 66.0 in | Wt 156.7 lb

## 2024-09-14 DIAGNOSIS — I10 Essential (primary) hypertension: Secondary | ICD-10-CM

## 2024-09-14 DIAGNOSIS — G894 Chronic pain syndrome: Secondary | ICD-10-CM | POA: Diagnosis not present

## 2024-09-14 DIAGNOSIS — R109 Unspecified abdominal pain: Secondary | ICD-10-CM | POA: Diagnosis not present

## 2024-09-14 DIAGNOSIS — Z01818 Encounter for other preprocedural examination: Secondary | ICD-10-CM

## 2024-09-14 DIAGNOSIS — G4700 Insomnia, unspecified: Secondary | ICD-10-CM | POA: Diagnosis not present

## 2024-09-14 MED ORDER — LIDOCAINE 5 % EX PTCH: 1.0000 | MEDICATED_PATCH | Freq: Every day | CUTANEOUS | 2 refills | Status: AC | PRN

## 2024-09-14 NOTE — Assessment & Plan Note (Signed)
 Patient's blood pressure today was controlled at 126/60.  She brought in her medications and reports she is taking them. - Continue losartan  50 mg daily, isosorbide  dinitrate 20 mg 3 times daily, hydralazine  50 mg 3 times daily

## 2024-09-14 NOTE — Assessment & Plan Note (Signed)
 Patient reports she is taking mirtazapine  7.5 mg at bedtime, although it was not present in her medication box today.

## 2024-09-14 NOTE — Assessment & Plan Note (Signed)
 Patient requested for lidocaine  patches which refilled today.

## 2024-09-14 NOTE — Assessment & Plan Note (Signed)
 Patient presents today for preoperative clearance for LTHA with Dr. Fidel.  Reports she is not on any anticoagulation and does not smoke.  She received cardiology clearance from Dr. [name] Dames.  Patient's blood pressure and blood sugar levels are controlled.  She appears to be compensated and is on dialysis.  Per today's interview and PE, patient is at low risk and is medically cleared for the procedure.  Filled in her preoperative paperwork for medical clearance and informed patient that it will be faxed to [facility] soon.

## 2024-09-14 NOTE — Patient Instructions (Addendum)
 Please follow instructions as discussed in today's plan: - Your medical clearance form was completed today for left knee replacement.  We will fax over the form to [facility].  Thank you  Sincerely, Rebecka Pion, DO
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
## 2024-09-20 NOTE — Progress Notes (Signed)
 Internal Medicine Clinic Attending  I was physically present during the key portions of the resident provided service and participated in the medical decision making of patient's management care. I reviewed pertinent patient test results.  The assessment, diagnosis, and plan were formulated together and I agree with the documentation in the resident's note.  Rosan Dayton BROCKS, DO

## 2024-09-21 ENCOUNTER — Ambulatory Visit: Payer: Self-pay | Admitting: Cardiovascular Disease

## 2024-09-21 ENCOUNTER — Ambulatory Visit (HOSPITAL_COMMUNITY)
Admission: RE | Admit: 2024-09-21 | Discharge: 2024-09-21 | Disposition: A | Discharge status: Home / Self Care | Source: Ambulatory Visit | Attending: Cardiovascular Disease | Admitting: Cardiovascular Disease

## 2024-09-21 DIAGNOSIS — R0989 Other specified symptoms and signs involving the circulatory and respiratory systems: Secondary | ICD-10-CM | POA: Diagnosis not present

## 2024-09-24 ENCOUNTER — Encounter: Payer: Self-pay | Admitting: *Deleted

## 2024-09-24 ENCOUNTER — Telehealth: Admitting: *Deleted

## 2024-09-24 NOTE — Patient Instructions (Signed)
 Heron GORMAN Sauers - I am sorry I was unable to reach you today for our scheduled appointment. I work with [name], DO and am calling to support your healthcare needs. Please contact me at [phone] at your earliest convenience. I look forward to speaking with you soon.   Thank you,   Olam Ku, RN, BSN [place]  [facility], [facility] RN Care Manager Direct Dial : [phone]  Fax: [phone]

## 2024-09-28 ENCOUNTER — Encounter: Payer: Self-pay | Admitting: *Deleted

## 2024-09-28 ENCOUNTER — Telehealth: Payer: Self-pay | Admitting: *Deleted

## 2024-09-28 NOTE — Patient Instructions (Signed)
 Heron GORMAN Sauers - I am sorry I was unable to reach you today for our scheduled appointment. I work with [name], DO and am calling to support your healthcare needs. Please contact me at [phone] at your earliest convenience. I look forward to speaking with you soon.   Thank you,   Olam Ku, RN, BSN [place]  [facility], [facility] RN Care Manager Direct Dial : [phone]  Fax: [phone]

## 2024-10-02 ENCOUNTER — Telehealth: Payer: Self-pay | Admitting: *Deleted

## 2024-10-02 ENCOUNTER — Encounter: Payer: Self-pay | Admitting: *Deleted

## 2024-10-02 NOTE — Patient Outreach (Addendum)
 Complex Care Management   Visit Note  10/02/2024  Name:  MABLE DARA MRN: [id number] DOB: 28-Feb-1956  Situation: RNCM briefly spoke with pt who indicated she was currently in the hospital. RNCM will follow up post discharged for ongoing case management services.   Olam Ku, RN, BSN [place]  [facility], [facility] RN Care Manager Direct Dial : [phone]  Fax: [phone]

## 2024-10-05 ENCOUNTER — Telehealth: Payer: Self-pay | Admitting: *Deleted

## 2024-10-05 NOTE — Telephone Encounter (Signed)
 Copied from CRM #[id number]. Topic: General - Other >> Oct 05, 2024 10:37 AM Chiquita SQUIBB wrote: Reason for CRM: ED with [facility] is calling in to confirm if the office received a fax regarding the patients oxygen  order sent on 12/23. Please advise [employer] at [phone] or fax the form back at the earliest convince.

## 2024-10-07 NOTE — ED Provider Notes (Signed)
 [facility] Emergency Department Emergency Department Provider Note  This document was created using the aid of voice recognition Dragon dictation software.   Provider at bedside: 10/08/2024 1:47 AM  History obtained from the: Patient  History   Chief Complaint  Patient presents with   Abdominal Pain    HPI  Audrey Hill is a 69 y.o. female with a history of chronic respiratory failure, CVA, ESRD on HD who presents to the ED with complaints of acute on chronic shortness of breath. Pt states she was sleeping this evening when her grandson noted pt was breathing abnormally. Grandson reportedly woke pt up who then called EMS. Additionally pt endorses upper abdominal discomfort which has been intermittent for a few months. She denies any associated fever or chest pain. Pt last dialyzed today.    1:47 AM Previous medical records reviewed from [facility] Everywhere and EPIC Chart Review.   No LMP recorded. Patient is postmenopausal.   ROS: Pertinent positives and negatives per HPI. Pertinent past medical, surgical, social and family history records were reviewed. Current Medications and Allergies were reviewed.  Physical Exam   Vitals:   10/07/24 0330 10/07/24 0437 10/07/24 0537 10/07/24 0551  BP: 117/69   158/74  BP Location: Right arm   Right arm  Patient Position: Lying   Lying  Pulse: 78   74  Resp:  15 15 15   Temp:      TempSrc:      SpO2: 99%   100%  Weight:      Height:        Physical Exam Vitals and nursing note reviewed.  Constitutional:      General: She is not in acute distress. HENT:     Head: Normocephalic and atraumatic.  Eyes:     Extraocular Movements: Extraocular movements intact.     Pupils: Pupils are equal, round, and reactive to light.  Cardiovascular:     Rate and Rhythm: Normal rate and regular rhythm.     Pulses: Normal pulses.  Pulmonary:     Effort: Pulmonary effort is normal.     Breath sounds: Wheezing  (expiratory) present.  Abdominal:     General: Abdomen is flat.     Palpations: Abdomen is soft.     Tenderness: There is generalized abdominal tenderness.     Comments: No focal tenderness, no peritoneal signs   Musculoskeletal:     Cervical back: Normal range of motion and neck supple.  Skin:    General: Skin is warm.     Capillary Refill: Capillary refill takes less than 2 seconds.  Neurological:     Mental Status: She is alert.  Psychiatric:        Behavior: Behavior normal.     Results  LABS Labs Reviewed  URINALYSIS WITH REFLEX TO MICROSCOPIC - Abnormal      Result Value   Color, Urine Colorless     Clarity, Urine Cloudy (*)    Specific Gravity, Urine 1.004 (*)    pH, Urine 8.0     Protein, Urine 50 (*)    Glucose, Urine Negative     Ketones, Urine Negative     Bilirubin, Urine Negative     Blood, Urine Negative     Nitrite, Urine Negative     Leukocyte Esterase, Urine 75 (*)    Urobilinogen, Urine Normal     WBC, Urine 6-12 (*)    RBC, Urine 3-5 (*)    Bacteria, Urine Many (*)  Squamous Epithelial Cells, Urine 11-20 (*)    Hyaline Casts, Urine 0-2    COMPREHENSIVE METABOLIC PANEL - Abnormal   Sodium 138     Potassium 3.6     Chloride 97 (*)    CO2 32 (*)    Anion Gap 9     Glucose, Random 94     Blood Urea Nitrogen (BUN) 30 (*)    Creatinine 5.51 (*)    eGFR 8 (*)    Albumin 3.6     Total Protein 7.1     Bilirubin, Total 0.5     Alkaline Phosphatase (ALP) 167 (*)    Aspartate Aminotransferase (AST) 20     Alanine Aminotransferase (ALT) 22     Calcium  8.6     BUN/Creatinine Ratio 5.4 (*)   LIPASE - Abnormal   Lipase 86 (*)   CBC WITH DIFFERENTIAL - Abnormal   WBC 4.31 (*)    RBC 3.16 (*)    Hemoglobin 8.4 (*)    Hematocrit 25.7 (*)    Mean Corpuscular Volume (MCV) 81.3     Mean Corpuscular Hemoglobin (MCH) 26.7 (*)    Mean Corpuscular Hemoglobin Conc (MCHC) 32.8 (*)    Red Cell Distribution Width (RDW) 20.3 (*)    Platelet Count (PLT) 194      Mean Platelet Volume (MPV) 8.4     Neutrophils % 73     Lymphocytes % 11     Monocytes % 13     Eosinophils % 2     Basophils % 1     Neutrophils Absolute 3.10     Lymphocytes # 0.50 (*)    Monocytes # 0.60     Eosinophils # 0.10     Basophils # 0.00    B-TYPE NATRIURETIC PEPTIDE (BNP) - Abnormal   B-Type Natriuretic Peptide (BNP) 3,131 (*)   URINE CULTURE    Radiology   Radiology Results (last 72 hours)     Procedure Component Value Units Date/Time   XR Chest 1 View [[id number]] Collected: 10/07/24 0549   Order Status: Completed Updated: 10/07/24 0550   Narrative:     Date of Service: 2024-10-07 01:55:00  EXAM:  Chest x-ray, 1 View  DATE/ TIME:  10/07/2024, 2:28 AM  INDICATION:  Shortness of breath  TECHNIQUE:  Single AP view taken portably.  COMPARISON:  PCXR 10/01/2024, 6:00 PM (and others).   FINDINGS:  The heart is massive, as seen previously.  Pulmonary vessels are indistinct centrally.  No airspace consolidation or mass is seen.  Small posterior pleural effusions cannot be excluded.  No acute osseous abnormality is apparent.    Impression:     Mild central vascular congestion in this patient with marked cardiomegaly.  Electronically signed by: Lynwood Ferrari, DO on 10/07/2024 05:49:36 AM US [place]        Medications Given in Emergency Department   Medications  ipratropium-albuteroL  (DUO-NEB) 0.5-2.5 mg/3 mL nebulizer solution 3 mL (3 mL nebulization Given 10/07/24 0213)  furosemide  (LASIX ) injection 80 mg (80 mg intravenous Given 10/07/24 0344)  HYDROmorphone  (DILAUDID ) tablet 2 mg (2 mg oral Given 10/07/24 0437)    Medical Decision Making  Medical Decision Making Problems Addressed: Chronic abdominal pain: complicated acute illness or injury ESRD on dialysis (CMD): chronic illness or injury HFrEF (heart failure with reduced ejection fraction) (CMD): chronic illness or injury with exacerbation, progression, or side effects of treatment Shortness of  breath: complicated acute illness or injury with systemic symptoms  Amount and/or Complexity of Data  Reviewed External Data Reviewed: notes. Labs: ordered. Decision-making details documented in ED Course. Radiology: ordered and independent interpretation performed. Decision-making details documented in ED Course. ECG/medicine tests: ordered and independent interpretation performed. Decision-making details documented in ED Course.  Risk Prescription drug management.      Additional Information: I reviewed the patient's past medical history, including notes from our facility and what is available in CareEverywhere.  Differentials considered: MI, CHF, myocarditis, pericarditis, tamponade, pneumonia, sepsis, acidosis, anemia, anaphylaxis, inhalation injury, asthma, COPD, PE, pneumothorax, pleural effusion, URI, bronchitis, smoke inhalation, aspiration, myopathy, neuropathy, psychogenic hyperventilation, COVID-19   On my initial exam, the pt was mildly hypertensive otherwise hemodynamically stable, chronically ill-appearing and in no acute distress.   physical examination without increased work of breathing or tachypnea faint expiratory wheeze noted throughout all lung fields.  Mild generalized/distractible abdominal tenderness noted, no focal tenderness or peritoneal signs.  Upon workup today CBC revealed leukopenia with a WBC of 4.31, anemia with a hemoglobin of 8.4.  Per chart review this appears to be patient's baseline, likely secondary to ER SD.  No transfusion necessary at this time.  CMP with appropriate potassium at 3.6, creatinine 5.51 which is stable for patient.  Chest x-ray revealed cardiomegaly and vascular congestion, per chart review this also appears stable for patient.  While in the emergency department this evening patient was given IV dose of home Lasix .  Additionally given dose of home pain medication and albuterol  treatment.  Lung aeration did improve following this.  Patient states  she is out of her chronic pain medication I do suspect this also plays a role in patient's emergency room visit this evening.  I did encourage her to follow-up with her pain management clinic for this.  She was monitored in the ED for approximately 5 hours remained stable without hypoxic episodes or signs of respiratory distress.  Stable for discharge home at this time with continued outpatient management.   The following decision tools were used to help assess clinical picture and make decisions: pts history, exam, labs, imaging   Patient hooked up to cardiac telemetry for close monitoring, telemetry interpretation: Sinus rhythm   ED Clinical Impression   Clinical Complexity Number and complexity of problems addressed: Patient's presentation is most consistent with acute presentation with potential threat to life or bodily function.  Amount and/or complexity of data reviewed, analyzed: Assessment requiring independent historians used: EMS  External notes reviewed: Prior PCP visit, prior emergency department visits from outside hospital, prior subspecialist consultation visit, prior hospitalization  Lab results personally reviewed by me and considered in medical decision making of this patient's treatment and disposition, and pertinent information noted in ED course  Radiology imaging reviewed and interpreted personally by me and pertinent information noted in ED Course  All radiology studies reviewed independently, additionally reading provided by radiologist available, unless otherwise noted.  Plan: Clinical picture was discussed with patient along with risks and benefits of management options, shared decision making we will discharge the patient home with close outpatient follow-up for continued management. Based on the above findings, I believe patient is hemodynamically stable for discharge.  The following prescriptions were given for continued management of symptoms or  pathologies:none  Patient/and family educated about specific return precautions for given chief complaint and symptoms.  Patient/and family educated about follow-up with PCP.  Patient/and family expressed understanding of return precautions and need for follow-up.  Patient discharged. Diagnosis, treatment, plan discussed with patient.  All questions were answered to the patient's satisfaction.  If patient was given medication(s), adverse effects, black box warnings, and drug interactions were discussed with patient.  Patient instructed to follow-up here in the emergency department for new or worsening symptoms.     1. Shortness of breath   2. ESRD on dialysis (CMD)   3. HFrEF (heart failure with reduced ejection fraction) (CMD)   4. Chronic abdominal pain      Medication List     ASK your doctor about these medications    albuterol  HFA 90 mcg/actuation inhaler Commonly known as: PROVENTIL  HFA;VENTOLIN  HFA;PROAIR  HFA Inhale 2 puffs every 6 (six) hours as needed for wheezing.   amiodarone  200 mg tablet Commonly known as: PACERONE  Take 1 tablet (200 mg total) by mouth daily.   atorvastatin  40 mg tablet Commonly known as: LIPITOR Take 40 mg by mouth daily.   calcium  acetate 667 mg Tab tablet Commonly known as: CALIPHRON TAKE 1 TABLET BY MOUTH THREE TIMES DAILY WITH MEALS AND ONE WITH A SNACK ONCE DAILY   carvedilol  3.125 mg tablet Commonly known as: COREG  Take 1 tablet (3.125 mg total) by mouth in the morning and 1 tablet (3.125 mg total) in the evening. Take with meals.   diclofenac  sodium 1 % gel Commonly known as: VOLTAREN  Apply to left shoulder and neck   famotidine  20 mg tablet Commonly known as: PEPCID  Take 1 tablet (20 mg total) by mouth 2 (two) times a day.   ferrous sulfate 325 mg (65 mg iron ) tablet Take 1 tablet (325 mg total) by mouth every other day.   furosemide  80 mg tablet Commonly known as: LASIX  Take 1 tablet (80 mg total) by mouth 4 (four) times a week.  Take 1 tablet M,W,F,Sun (non HD days)   guaiFENesin  600 mg 12 hr tablet Commonly known as: MUCINEX  Take 1,200 mg by mouth daily as needed for cough.   hydrALAZINE  25 mg tablet Commonly known as: APRESOLINE  Take 3 tablets (75 mg total) by mouth in the morning and 3 tablets (75 mg total) at noon and 3 tablets (75 mg total) in the evening.   HYDROmorphone  2 mg tablet Commonly known as: DILAUDID  Take 2 mg by mouth 3 (three) times a day as needed.   isosorbide  dinitrate 20 mg tablet Commonly known as: ISORDIL  Take 1 tablet (20 mg total) by mouth 3 (three) times a day.   Lidoderm  5 % patch Generic drug: lidocaine  Place 2 patches on the skin daily. Applied a couple of days ago, still has them on.   loratadine  10 mg tablet Commonly known as: CLARITIN  Take 10 mg by mouth daily.   losartan  50 mg tablet Wait to take this until your doctor or other care provider tells you to start again. Commonly known as: COZAAR  Take 1 tablet (50 mg total) by mouth 4 (four) times a week. Take 1 tablet M,W,F,Sun (non HD days) at NIGHT   mirtazapine  15 mg tablet Wait to take this until your doctor or other care provider tells you to start again. Commonly known as: REMERON  Take 15 mg by mouth nightly.   pantoprazole  40 mg EC tablet Commonly known as: PROTONIX  Take 1 tablet (40 mg total) by mouth 2 (two) times a day.   polyethylene glycol 17 gram packet Commonly known as: GLYCOLAX  Take 17 g by mouth daily as needed.   prochlorperazine  5 mg tablet Commonly known as: COMPAZINE  Take 1 tablet (5 mg total) by mouth every 6 (six) hours as needed for nausea or vomiting.   Rena-Vite 0.8 mg  Tab tablet Generic drug: B complex-vitamin C-folic acid  Take 1 tablet by mouth nightly.   senna 8.6 mg tablet Commonly known as: SENOKOT Take 2 tablets by mouth.   sertraline  25 mg tablet Wait to take this until your doctor or other care provider tells you to start again. Commonly known as: ZOLOFT  Take 1 tablet  (25 mg total) by mouth daily.   sucralfate  100 mg/mL oral suspension Commonly known as: CARAFATE  Take 10 mL (1 g total) by mouth 4 (four) times a day before meals and nightly.   Vitamin C 500 mg tablet Generic drug: ascorbic acid Take 1 tablet (500 mg total) by mouth every other day. Take with ferrous sulfate       FOLLOW UP Dayton Sherlean Eastern, MD [address] [place] [address] [phone]  Schedule an appointment as soon as possible for a visit    [facility] -  EMERGENCY DEPARTMENT [address] [employer] [place]  [address] [phone] Go to  If symptoms worsen, As needed  _____________________________  Attestation: Charleen Fox, PA-C obtained and performed the history, physical exam and medical decision making elements that were entered into the chart.  10/08/2024  3:11 AM

## 2024-10-08 ENCOUNTER — Emergency Department (HOSPITAL_COMMUNITY)

## 2024-10-08 ENCOUNTER — Emergency Department (HOSPITAL_COMMUNITY)
Admission: EM | Admit: 2024-10-08 | Discharge: 2024-10-09 | Disposition: A | Discharge status: Home / Self Care | Attending: Emergency Medicine | Admitting: Emergency Medicine

## 2024-10-08 ENCOUNTER — Other Ambulatory Visit: Payer: Self-pay

## 2024-10-08 ENCOUNTER — Encounter (HOSPITAL_COMMUNITY): Payer: Self-pay

## 2024-10-08 DIAGNOSIS — J9611 Chronic respiratory failure with hypoxia: Secondary | ICD-10-CM | POA: Diagnosis not present

## 2024-10-08 DIAGNOSIS — G8929 Other chronic pain: Secondary | ICD-10-CM | POA: Insufficient documentation

## 2024-10-08 DIAGNOSIS — Z79899 Other long term (current) drug therapy: Secondary | ICD-10-CM | POA: Diagnosis not present

## 2024-10-08 DIAGNOSIS — R059 Cough, unspecified: Secondary | ICD-10-CM | POA: Diagnosis not present

## 2024-10-08 DIAGNOSIS — I5020 Unspecified systolic (congestive) heart failure: Secondary | ICD-10-CM | POA: Insufficient documentation

## 2024-10-08 DIAGNOSIS — N186 End stage renal disease: Secondary | ICD-10-CM | POA: Insufficient documentation

## 2024-10-08 DIAGNOSIS — M25552 Pain in left hip: Secondary | ICD-10-CM | POA: Insufficient documentation

## 2024-10-08 DIAGNOSIS — M791 Myalgia, unspecified site: Secondary | ICD-10-CM

## 2024-10-08 DIAGNOSIS — Z992 Dependence on renal dialysis: Secondary | ICD-10-CM | POA: Insufficient documentation

## 2024-10-08 DIAGNOSIS — J45909 Unspecified asthma, uncomplicated: Secondary | ICD-10-CM | POA: Diagnosis not present

## 2024-10-08 DIAGNOSIS — R0602 Shortness of breath: Secondary | ICD-10-CM | POA: Diagnosis present

## 2024-10-08 DIAGNOSIS — I482 Chronic atrial fibrillation, unspecified: Secondary | ICD-10-CM | POA: Diagnosis not present

## 2024-10-08 DIAGNOSIS — I132 Hypertensive heart and chronic kidney disease with heart failure and with stage 5 chronic kidney disease, or end stage renal disease: Secondary | ICD-10-CM | POA: Diagnosis not present

## 2024-10-08 LAB — COMPREHENSIVE METABOLIC PANEL WITH GFR
ALT: 28 U/L (ref 0–44)
AST: 25 U/L (ref 15–41)
Albumin: 3.9 g/dL (ref 3.5–5.0)
Alkaline Phosphatase: 173 U/L — ABNORMAL HIGH (ref 38–126)
Anion gap: 15 (ref 5–15)
BUN: 56 mg/dL — ABNORMAL HIGH (ref 8–23)
CO2: 26 mmol/L (ref 22–32)
Calcium: 9.6 mg/dL (ref 8.9–10.3)
Chloride: 101 mmol/L (ref 98–111)
Creatinine, Ser: 8.81 mg/dL — ABNORMAL HIGH (ref 0.44–1.00)
GFR, Estimated: 4 mL/min — ABNORMAL LOW
Glucose, Bld: 80 mg/dL (ref 70–99)
Potassium: 4.3 mmol/L (ref 3.5–5.1)
Sodium: 143 mmol/L (ref 135–145)
Total Bilirubin: 0.5 mg/dL (ref 0.0–1.2)
Total Protein: 7.5 g/dL (ref 6.5–8.1)

## 2024-10-08 LAB — CBC WITH DIFFERENTIAL/PLATELET
Abs Immature Granulocytes: 0.01 K/uL (ref 0.00–0.07)
Basophils Absolute: 0 K/uL (ref 0.0–0.1)
Basophils Relative: 0 %
Eosinophils Absolute: 0.1 K/uL (ref 0.0–0.5)
Eosinophils Relative: 2 %
HCT: 27 % — ABNORMAL LOW (ref 36.0–46.0)
Hemoglobin: 8.6 g/dL — ABNORMAL LOW (ref 12.0–15.0)
Immature Granulocytes: 0 %
Lymphocytes Relative: 11 %
Lymphs Abs: 0.6 K/uL — ABNORMAL LOW (ref 0.7–4.0)
MCH: 27 pg (ref 26.0–34.0)
MCHC: 31.9 g/dL (ref 30.0–36.0)
MCV: 84.6 fL (ref 80.0–100.0)
Monocytes Absolute: 0.6 K/uL (ref 0.1–1.0)
Monocytes Relative: 11 %
Neutro Abs: 4.2 K/uL (ref 1.7–7.7)
Neutrophils Relative %: 76 %
Platelets: 200 K/uL (ref 150–400)
RBC: 3.19 MIL/uL — ABNORMAL LOW (ref 3.87–5.11)
RDW: 19.5 % — ABNORMAL HIGH (ref 11.5–15.5)
WBC: 5.6 K/uL (ref 4.0–10.5)
nRBC: 0 % (ref 0.0–0.2)

## 2024-10-08 LAB — RESP PANEL BY RT-PCR (RSV, FLU A&B, COVID)  RVPGX2
Influenza A by PCR: NEGATIVE
Influenza B by PCR: NEGATIVE
Resp Syncytial Virus by PCR: NEGATIVE
SARS Coronavirus 2 by RT PCR: NEGATIVE

## 2024-10-08 LAB — PRO BRAIN NATRIURETIC PEPTIDE: Pro Brain Natriuretic Peptide: >35000 pg/mL — ABNORMAL HIGH

## 2024-10-08 LAB — TROPONIN T, HIGH SENSITIVITY: Troponin T High Sensitivity: 127 ng/L (ref 0–19)

## 2024-10-08 NOTE — ED Notes (Signed)
 Patient transported to X-ray

## 2024-10-08 NOTE — ED Triage Notes (Addendum)
 PER EMS: pt is from home with c/o exertional shortness of breath, chest pain, cough, body aches and diarrhea; onset yesterday. She uses 3L  at baseline. She received dialysis Tue/Thur/Sat, last dialyzed on Saturday.  BP- 188/92 HR-95 O2-98% 3L  CBG-150

## 2024-10-08 NOTE — Telephone Encounter (Unsigned)
 Copied from CRM [id number]. Topic: Clinical - Medication Refill >> Oct 08, 2024  8:40 AM Antonio H wrote: Medication: HYDROmorphone  (DILAUDID ) 2 MG tablet  Has the patient contacted their pharmacy? Yes (Agent: If no, request that the patient contact the pharmacy for the refill. If patient does not wish to contact the pharmacy document the reason why and proceed with request.) (Agent: If yes, when and what did the pharmacy advise?) Advised to contact doctor   This is the patient's preferred pharmacy:  [id number] - [place], [place] - [address] [place] [address] Phone: [phone] Fax: [phone]  Is this the correct pharmacy for this prescription? Yes If no, delete pharmacy and type the correct one.   Has the prescription been filled recently? No  Is the patient out of the medication? Yes  Has the patient been seen for an appointment in the last year OR does the patient have an upcoming appointment? Yes  Can we respond through MyChart? No  Agent: Please be advised that Rx refills may take up to 3 business days. We ask that you follow-up with your pharmacy.

## 2024-10-08 NOTE — ED Notes (Signed)
 Date and time results received: 10/08/2024 2127 (use smartphrase .now to insert current time)  Test: troponin Critical Value: 127  Name of Provider Notified: Terrall First, PA-C

## 2024-10-08 NOTE — ED Provider Triage Note (Signed)
 Emergency Medicine Provider Triage Evaluation Note  Audrey Hill , a 69 y.o. female  was evaluated in triage.  Pt complains of cough, congestion, bodyaches, shortness of breath on exertion. Last had dialysis on Saturday, normally going T,TH,Sat. No known sick contacts. Noting green sputum with cough.   Noting exacerbation of chronic hip pain. LE swelling.   Review of Systems  Positive: N/a Negative: N/a  Physical Exam  BP (!) 185/91   Pulse 82   Temp 98.2 F (36.8 C) (Oral)   Resp 15   Ht 5' 6 (1.676 m)   Wt 68 kg   SpO2 100%   BMI 24.21 kg/m  Gen:   Awake, no distress   Resp:  Normal effort  MSK:   Moves extremities without difficulty  Other:  Focal consolidation to R lower lobe  Medical Decision Making  Medically screening exam initiated at 7:13 PM.  Appropriate orders placed.  MALEIYAH RELEFORD was informed that the remainder of the evaluation will be completed by another provider, this initial triage assessment does not replace that evaluation, and the importance of remaining in the ED until their evaluation is complete.     Beola Terrall GORMAN, PA-C 10/08/24 [phone]

## 2024-10-08 NOTE — ED Notes (Signed)
 Pt assisted to the restroom while waiting in the lobby
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
## 2024-10-09 ENCOUNTER — Telehealth: Admitting: *Deleted

## 2024-10-09 ENCOUNTER — Telehealth: Payer: Self-pay | Admitting: *Deleted

## 2024-10-09 ENCOUNTER — Encounter: Payer: Self-pay | Admitting: *Deleted

## 2024-10-09 DIAGNOSIS — M791 Myalgia, unspecified site: Secondary | ICD-10-CM

## 2024-10-09 LAB — TROPONIN T, HIGH SENSITIVITY: Troponin T High Sensitivity: 136 ng/L (ref 0–19)

## 2024-10-09 MED ORDER — HYDROMORPHONE HCL 2 MG PO TABS: 2.0000 mg | ORAL_TABLET | Freq: Two times a day (BID) | ORAL | 0 refills | Status: DC | PRN

## 2024-10-09 MED ORDER — ONDANSETRON 4 MG PO TBDP
4.0000 mg | ORAL_TABLET | Freq: Once | ORAL | Status: AC
  Administered 2024-10-09: 4 mg via ORAL
  Filled 2024-10-09: qty 1

## 2024-10-09 MED ORDER — OXYCODONE-ACETAMINOPHEN 5-325 MG PO TABS
1.0000 | ORAL_TABLET | Freq: Once | ORAL | Status: AC
  Administered 2024-10-09: 1 via ORAL
  Filled 2024-10-09: qty 1

## 2024-10-09 MED ORDER — HYDRALAZINE HCL 25 MG PO TABS
50.0000 mg | ORAL_TABLET | Freq: Once | ORAL | Status: AC
  Administered 2024-10-09: 50 mg via ORAL
  Filled 2024-10-09: qty 2

## 2024-10-09 NOTE — Discharge Instructions (Signed)
 You are seen today for ongoing shortness of breath and hip pain.  It is very important that you proceed to dialysis later this morning as you normally would.  Follow-up close with your primary doctor regarding pain management and prescriptions for your pain medications.  Continue your home blood pressure and Lasix  medications.

## 2024-10-09 NOTE — Telephone Encounter (Signed)
 Copied from CRM [id number]. Topic: Clinical - Medication Refill >> Oct 09, 2024  3:20 PM Debby BROCKS wrote: Medication: HYDROmorphone  (DILAUDID ) 2 MG tablet  Has the patient contacted their pharmacy? Yes (Agent: If no, request that the patient contact the pharmacy for the refill. If patient does not wish to contact the pharmacy document the reason why and proceed with request.) (Agent: If yes, when and what did the pharmacy advise?)  Pharmacy states that the attending physician needs to sign it so they can release it to her  This is the patient's preferred pharmacy:  [id number] - [place], [place] - [address] [place] [address] Phone: [phone] Fax: [phone]  Is this the correct pharmacy for this prescription? Yes If no, delete pharmacy and type the correct one.   Has the prescription been filled recently? No  Is the patient out of the medication? Yes  Has the patient been seen for an appointment in the last year OR does the patient have an upcoming appointment? Yes  Can we respond through MyChart? Yes  Agent: Please be advised that Rx refills may take up to 3 business days. We ask that you follow-up with your pharmacy.

## 2024-10-09 NOTE — Telephone Encounter (Signed)
 Hydromorphone  has been refilled on 10/09/24.

## 2024-10-09 NOTE — ED Notes (Signed)
 Light green tube recollected and sent.

## 2024-10-09 NOTE — Telephone Encounter (Signed)
 Called pt who is currently at dialysis. Stated she needs to go to the hospital b/c of the fluid. I asked her if she mentioned this to the nurses at dialysis about how she's feeling; stated she had not. Informed pt she needs to tell the nurses - stated she will.

## 2024-10-09 NOTE — Telephone Encounter (Signed)
 Copied from CRM [id number]. Topic: Clinical - Medical Advice >> Oct 09, 2024  1:56 PM Farrel B wrote: Reason for CRM: Patient is currently dialysis and states that she's constantly filling with fluid pt states that she was sick last night from all the fluid and is needing the provider to write orders for her to be hospitalized until the fluid can be drained off, she doesn't feel well   [phone] please call pt to advise.

## 2024-10-10 ENCOUNTER — Other Ambulatory Visit: Payer: Self-pay | Admitting: Student

## 2024-10-10 ENCOUNTER — Telehealth: Payer: Self-pay | Admitting: *Deleted

## 2024-10-10 DIAGNOSIS — M791 Myalgia, unspecified site: Secondary | ICD-10-CM

## 2024-10-10 MED ORDER — HYDROMORPHONE HCL 2 MG PO TABS: 2.0000 mg | ORAL_TABLET | Freq: Two times a day (BID) | ORAL | 0 refills | Status: AC

## 2024-10-10 NOTE — Telephone Encounter (Signed)
 Call from Pharmacy have been unable to get the previous prescription to go through.  Asked if another prescription can be sent in for the patient.  Pharmacy will delete the previous prescription that was sent in for the patient.

## 2024-10-10 NOTE — Telephone Encounter (Signed)
 Pt made aware that rx sent to pharmacy   Of note-received call from [facility] with [facility]-there was some confusion over medical clearance form, but office has confirm receipt of clearance form completed by [facility] and will reach out to nephrologist for additional clearance.

## 2024-10-10 NOTE — Telephone Encounter (Signed)
 Call to the [facility] spoke with a Pharmacist they are currently working to see why the prescriber information is not going through as the prescriber has done previous prescriptions for the patient.  Will call when issue is resolved.   Call to patient message left that the pharmacy is working on the issue of getting the prescription filled.                                                   Copied from CRM [id number]. Topic: Clinical - Prescription Issue >> Oct 10, 2024  9:19 AM Diannia H wrote: Reason for CRM: Patient called and stated the pharmacy told her her RX for HYDROmorphone  (DILAUDID ) 2 MG tablet could not be filled because they couldn't fill it with Dr. Tobie. Could you assist? Patient callback is [phone].

## 2024-10-10 NOTE — Telephone Encounter (Signed)
 RTC to patient message left and patient RTC that a new prescription will need to be sent to the Pharmacy.  Forwarded to the [employer] to rewrite prescription for patient.  Also spoke to [facility] about patient's question about het Surgical Clearance .  Hme to refax information about Surgical Clearance for patient.

## 2024-10-10 NOTE — Telephone Encounter (Signed)
 Fax from pharmacy regarding HYDROmorphone  2mg  tabs written by Dr Tobie  Prescriber not authorized for drug DEA class, please have another provider

## 2024-10-15 ENCOUNTER — Ambulatory Visit: Payer: Self-pay

## 2024-10-15 ENCOUNTER — Telehealth: Payer: Self-pay

## 2024-10-15 VITALS — BP 164/74 | HR 79 | Temp 98.4°F | Ht 66.0 in | Wt 165.8 lb

## 2024-10-15 DIAGNOSIS — I10 Essential (primary) hypertension: Secondary | ICD-10-CM

## 2024-10-15 DIAGNOSIS — I4581 Long QT syndrome: Secondary | ICD-10-CM | POA: Insufficient documentation

## 2024-10-15 DIAGNOSIS — I5020 Unspecified systolic (congestive) heart failure: Secondary | ICD-10-CM

## 2024-10-15 DIAGNOSIS — N186 End stage renal disease: Secondary | ICD-10-CM

## 2024-10-15 DIAGNOSIS — F329 Major depressive disorder, single episode, unspecified: Secondary | ICD-10-CM

## 2024-10-15 DIAGNOSIS — I480 Paroxysmal atrial fibrillation: Secondary | ICD-10-CM

## 2024-10-15 MED ORDER — FUROSEMIDE 80 MG PO TABS: ORAL_TABLET | ORAL | 2 refills | Status: AC

## 2024-10-15 MED ORDER — HYDRALAZINE HCL 50 MG PO TABS: 75.0000 mg | ORAL_TABLET | Freq: Three times a day (TID) | ORAL | 11 refills | Status: DC

## 2024-10-15 NOTE — Patient Outreach (Signed)
 Complex Care Management   Visit Note  10/09/2024  Name:  Audrey Hill MRN: [id number] DOB: May 01, 1956  Situation: RNCM briefly spoke with pt who requested to reschedule not a good time to talk.  RNCM will rescheduled for 10/18/2024 with the new Nichole Jobs, RNCM   Olam Ku, RN, BSN [facility], [facility] RN Care Manager Direct Dial : [phone]  Fax: [phone]

## 2024-10-15 NOTE — Transitions of Care (Post Inpatient/ED Visit) (Signed)
" ° °  10/15/2024  Name: Audrey Hill MRN: [id number] DOB: Apr 25, 1956  Today's TOC FU Call Status: Today's TOC FU Call Status:: Unsuccessful Call (1st Attempt) Unsuccessful Call (1st Attempt) Date: 10/15/24  Attempted to reach the patient regarding the most recent Inpatient/ED visit.  Follow Up Plan: Additional outreach attempts will be made to reach the patient to complete the Transitions of Care (Post Inpatient/ED visit) call.    Bing Edison MSN, RN RN [employer]  [facility] Office Hours M-F [phone] Direct Dial : [phone] Main Phone [phone]  Fax: [phone] [email or web address]    "

## 2024-10-15 NOTE — Assessment & Plan Note (Addendum)
 Recent admission to [facility] for volume overload.  She did have elevated BNP while she was there.  She underwent dialysis on Thursday and Saturday.  Throughout her hospitalization she was maintained on her home oxygen  of 3 L nasal cannula.  She has not missed any dialysis sessions.  She does report to me today that she has not taken her Lasix  in about 3 months due to not receiving any  She is somewhat confused about her medications as she has many hospitalizations and ED visits without follow-up after.  While she was admitted a ultrasound demonstrated recovery of her ejection fraction to 50 to 55%.  She did have eccentric left ventricular hypertrophy as well.  Since discharge, she reports that her shortness of breath has been worsening somewhat.  She also has some mild abdominal pain.  On physical exam, she has crackles at her right lung base.  She has no peripheral edema.  EKG performed today showed sinus rhythm with premature atrial complexes.  She remains on 3 L of her home nasal cannula.  Her oxygen  saturations were 97% with ambulation with 3L. She did feel lightheaded when ambulating without O2.  Due to the patient being able to maintain her saturations and upcoming dialysis session tomorrow, I do not think that she requires hospitalization at this time.  I think that is appropriate to assume that her symptoms will improve with dialysis and with restarting her Lasix .  Her weight has also up trended since September of last year where she was around 140 pounds to around 165 pounds at today's visit.  Her reported weight at [facility] at [facility] was 164 pounds.  I think much of her weight gain has been due to her being off of Lasix  for some time.  She has also been taking mirtazapine  which may contribute to weight gain.  Her losartan  was stopped during the hospitalization and we will ask the patient to confer with her nephrologist about whether she should be taking this medication.  - Restart Lasix  80  mg on nondialysis days, prescription sent to [facility] -Inquire with nephrology about whether she should be taking her losartan . -Continue taking carvedilol  3.125 mg daily, this medication is not in her pill packs and she is independently taking this -Continue Mircera per nephrology -Follow-up in 1 week to assess her weight and symptoms. -She was provided with instructions to present to the ED if she began to have shortness of breath that was not controlled with her home level of oxygen . -Continue dialysis Tuesday Thursday Saturday -Continue binders per nephrology - Referral placed to clinical pharmacist for assistance with medication management given pill packs and extra bottles of medications.

## 2024-10-15 NOTE — Progress Notes (Signed)
 "  CC: Hospital Follow Up after admission from 1/8 to 1/10 for Volume overload  HPI:  Audrey Hill is a 69 y.o. 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 Please see problem based assessment and plan for further history.  Follow up Hospitalization  Patient was admitted to [facility] on 1/8 and discharged on 1/10. She was treated for volume overload of undetermined etiology. Treatment for this included dialysis and continuation of her prior to admission medications. Her Mirtazapine , Sertraline , and Losartan  were held at discharge This visit was performed within 2 days of discharge, a complete medication reconciliation was done at this appointment She reports good compliance with treatment. She reports this condition is stayed the same.  ROS  Medications: Current Outpatient Medications  Medication Instructions   albuterol  (VENTOLIN  HFA) 108 (90 Base) MCG/ACT inhaler INHALE 2 PUFFS BY MOUTH EVERY 6 HOURS AS NEEDED FOR COUGH   amiodarone  (PACERONE ) 200 mg, Oral, Daily   atorvastatin  (LIPITOR) 40 mg, Oral, Daily   diclofenac  Sodium (VOLTAREN ) 4 g, Topical, 4 times daily   ferrous sulfate 325 mg, Every other day   furosemide  (LASIX ) 80 MG tablet Take 80mg  once in the morning on non-dialysis days.   hydrALAZINE  (APRESOLINE ) 50 mg, Oral, 3 times daily   HYDROmorphone  (DILAUDID ) 2 mg, Oral, Every 12 hours   isosorbide  dinitrate (ISORDIL ) 20 mg, Oral, 3 times daily   lidocaine  (LIDODERM ) 5 % 1 patch, Transdermal, Daily PRN, Remove & Discard patch within 12 hours or as directed by MD   losartan  (COZAAR ) 50 mg, Oral, Daily   Methoxy PEG-Epoetin  Beta (MIRCERA IJ) 100 mcg   mirtazapine  (REMERON ) 15 mg, Oral, Daily at bedtime   multivitamin (RENA-VIT) TABS tablet 1 tablet, Oral, Daily   pantoprazole  (PROTONIX ) 40 mg, Oral, Daily   polyethylene glycol (MIRALAX  / GLYCOLAX ) 17 g, Daily   senna (SENOKOT) 8.6 MG TABS tablet 2 tablets, Daily PRN   sertraline  (ZOLOFT ) 25 mg, Oral, Daily   sevelamer  carbonate (RENVELA ) 800 mg, Oral, 3 times daily with meals   triamcinolone (KENALOG) 0.025 % ointment 1 Application, Daily PRN     Physical Exam:  Vitals:   10/15/24 0813 10/15/24 0855  BP: (!) 177/79 (!) 164/74  Pulse: 82 79  Temp: 98.4 F (36.9 C)   TempSrc: Oral   SpO2: 100%   Weight: 165 lb 12.8 oz (75.2 kg)   Height: 5' 6 (1.676 m)     Physical Exam Constitutional:      General: She is not in acute distress.    Appearance: She is not ill-appearing.  Cardiovascular:     Rate and Rhythm: Normal rate.     Heart sounds: Murmur heard.     Comments: Mostly regular with one abnormal beat during auscultation Pulmonary:     Effort: Pulmonary effort is normal. No respiratory distress.     Breath sounds: No wheezing.     Comments: On 3L [place] Crackles at right lung base Abdominal:     General: Abdomen is flat.     Tenderness: There is no abdominal tenderness. There is no guarding.  Musculoskeletal:     Right lower leg: No edema.  Left lower leg: No edema.  Neurological:     Mental Status: She is alert.    EKG with premature atrial complexes, LVH. QtC was 521.   Assessment & Plan:   Assessment & Plan Heart failure with recovered ejection fraction (HFrecEF) (HCC) ESRD on dialysis [facility]) Recent admission to [facility] for volume overload.  She did have elevated BNP while she was there.  She underwent dialysis on Thursday and Saturday.  Throughout her hospitalization she was maintained on her home oxygen  of 3 L nasal cannula.  She has not missed any dialysis sessions.  She does report to me today that she has not  taken her Lasix  in about 3 months due to not receiving any  She is somewhat confused about her medications as she has many hospitalizations and ED visits without follow-up after.  While she was admitted a ultrasound demonstrated recovery of her ejection fraction to 50 to 55%.  She did have eccentric left ventricular hypertrophy as well.  Since discharge, she reports that her shortness of breath has been worsening somewhat.  She also has some mild abdominal pain.  On physical exam, she has crackles at her right lung base.  She has no peripheral edema.  EKG performed today showed sinus rhythm with premature atrial complexes.  She remains on 3 L of her home nasal cannula.  Her oxygen  saturations were 97% with ambulation with 3L. She did feel lightheaded when ambulating without O2.  Due to the patient being able to maintain her saturations and upcoming dialysis session tomorrow, I do not think that she requires hospitalization at this time.  I think that is appropriate to assume that her symptoms will improve with dialysis and with restarting her Lasix .  Her weight has also up trended since September of last year where she was around 140 pounds to around 165 pounds at today's visit.  Her reported weight at discharge at [facility] was 164 pounds.  I think much of her weight gain has been due to her being off of Lasix  for some time.  She has also been taking mirtazapine  which may contribute to weight gain.  Her losartan  was stopped during the hospitalization and we will ask the patient to confer with her nephrologist about whether she should be taking this medication.  - Restart Lasix  80 mg on nondialysis days, prescription sent to [facility] -Inquire with nephrology about whether she should be taking her losartan . -Continue taking carvedilol  3.125 mg daily, this medication is not in her pill packs and she is independently taking this -Continue Mircera per nephrology -Follow-up in 1 week to assess her weight and  symptoms. -She was provided with instructions to present to the ED if she began to have shortness of breath that was not controlled with her home level of oxygen . -Continue dialysis Tuesday Thursday Saturday -Continue binders per nephrology - Referral placed to clinical pharmacist for assistance with medication management given pill packs and extra bottles of medications. Primary hypertension Blood pressure elevated at this visit to 164/74.  Patient is compliant with her medications.  She does report low blood pressure after dialysis.  Currently on hydralazine  and Imdur.  Her losartan  was held at hospitalization.  She is prescribed Lasix  but has not been taking this for quite some time now.  Her symptoms of lightheadedness after dialysis, will defer increasing her hypertension medication.  - Patient was advised to inquire with nephrology whether she should be taking her losartan  or not. Paroxysmal atrial fibrillation (HCC)  She is not on anticoagulation for this due to prior GI bleed.  She is currently taking amiodarone  200 mg daily.  Her rhythm today was originally regular.  An EKG showed some premature atrial complexes.   - Continue amiodarone  Major depressive disorder, remission status unspecified, unspecified whether recurrent Prolonged QT syndrome History of depression managed with mirtazapine  and sertraline .  Due to question of whether the patient's weight gain may be due to excess calories, question need for appetite stimulation at this point.  Her PHQ-9 today was 8.  Her sertraline  and mirtazapine  were stopped after hospitalization due to prolonged QT interval.  She has not taken these medications since hospitalization.  Her QT today was 521.  We will defer restarting these medications for her.  QTc likely prolonged due to amiodarone  but she requires this medication for her atrial fibrillation.  -Stop sertraline  and mirtazapine   Complete medication reconciliation was performed today.   Patient getting several medications and pill packs and others outside of the pill packs.  She will benefit from a repackaging of her medications. Orders Placed This Encounter  Procedures   EKG 12-Lead    Patient discussed with Dr. Mliss Lovie Melvenia Napoleon, MD [facility] Internal Medicine Resident PGY-1 Clinic Phone: [phone] Please contact the on call pager at [phone] for any urgent or emergent needs.  "

## 2024-10-15 NOTE — Assessment & Plan Note (Signed)
 History of depression managed with mirtazapine  and sertraline .  Due to question of whether the patient's weight gain may be due to excess calories, question need for appetite stimulation at this point.  Her PHQ-9 today was 8.  Her sertraline  and mirtazapine  were stopped after hospitalization due to prolonged QT interval.  She has not taken these medications since hospitalization.  Her QT today was 521.  We will defer restarting these medications for her.  QTc likely prolonged due to amiodarone  but she requires this medication for her atrial fibrillation.  -Stop sertraline  and mirtazapine 

## 2024-10-15 NOTE — Patient Instructions (Addendum)
 Thank you, Ms. KY MOSKOWITZ, for allowing us  to provide your care today. Today we discussed . . .  > Shortness of breath       - Your shortness of breath is most likely due to excess fluid buildup around your lungs.  Because you have been off of your Lasix  for so long, I would like to restart this medication for you please take your Lasix  80 mg once each day starting today on nondialysis days.  Because this may drop your blood pressure low and they wanted you to pause your losartan  during the hospitalization, please ask your nephrologist if it is appropriate to restart your losartan  along with your Lasix .  Please start to weigh yourself with a scale daily to see if you start to lose any fluid weight.  If you start getting more shortness of breath after dialysis, would be appropriate to go to the ED. > Medications       - I would like you to see our pharmacist here to see if we can repackage your medications in any way. >Please stop taking your Mirtazapine  and your Sertraline      I have ordered the following labs for you:  Lab Orders  No laboratory test(s) ordered today      Referrals ordered today:   Referral Orders         AMB Referral VBCI Care Management        Follow up: 1 week    Remember:  Should you have any questions or concerns please call the internal medicine clinic at [phone].     Melvenia Morrison, [facility]

## 2024-10-15 NOTE — Assessment & Plan Note (Signed)
 She is not on anticoagulation for this due to prior GI bleed.  She is currently taking amiodarone  200 mg daily.  Her rhythm today was originally regular.  An EKG showed some premature atrial complexes.   - Continue amiodarone 

## 2024-10-15 NOTE — Progress Notes (Signed)
 SATURATION QUALIFICATIONS: (This note is used to comply with regulatory documentation for home oxygen )   Patient Saturations on Room Air at Rest = 98% (  pt is already on O2  didn't bring her tank because she reported it,as heavy .  she is on 3 liter at home  )   Patient Saturations on Room Air while Ambulating = 94% ( pt reported getting dizzy )    Patient Saturations on 3Liters of oxygen  while Ambulating = 96%    Patient Saturations on 3 liters while resting for 2 mins =100%

## 2024-10-15 NOTE — Assessment & Plan Note (Signed)
 Blood pressure elevated at this visit to 164/74.  Patient is compliant with her medications.  She does report low blood pressure after dialysis.  Currently on hydralazine  and Imdur.  Her losartan  was held at hospitalization.  She is prescribed Lasix  but has not been taking this for quite some time now.  Her symptoms of lightheadedness after dialysis, will defer increasing her hypertension medication.  - Patient was advised to inquire with nephrology whether she should be taking her losartan  or not.

## 2024-10-16 ENCOUNTER — Telehealth: Payer: Self-pay

## 2024-10-16 NOTE — Transitions of Care (Post Inpatient/ED Visit) (Signed)
" ° °  10/16/2024  Name: Audrey Hill MRN: [id number] DOB: [birth year]  Today's TOC FU Call Status: Today's TOC FU Call Status:: Unsuccessful Call (2nd Attempt) Unsuccessful Call (1st Attempt) Date: 10/15/24 Unsuccessful Call (2nd Attempt) Date: 10/16/24  Attempted to reach the patient regarding the most recent Inpatient/ED visit.  Follow Up Plan: Additional outreach attempts will be made to reach the patient to complete the Transitions of Care (Post Inpatient/ED visit) call.   Medford Balboa, BSN, RN   VBCI - Population Health RN Care Manager [phone]  "

## 2024-10-16 NOTE — Progress Notes (Signed)
 Internal Medicine Clinic Attending  Case discussed with the resident at the time of the visit.  We reviewed the resident's history and exam and pertinent patient test results.  I agree with the assessment, diagnosis, and plan of care documented in the resident's note.

## 2024-10-17 ENCOUNTER — Telehealth: Payer: Self-pay

## 2024-10-17 NOTE — Telephone Encounter (Signed)
 Received fax  Copied from CRM [id number]. Topic: General - Other >> Oct 17, 2024  9:54 AM DeAngela L wrote: Reason for CRM: Ed with [employer] calling to ask if a fax was received on 10/05/24, the previous document needs a provider to sign and date [employer] states the correct provider has to sign the form per their request the new document will be address to the provider that they need to sign, the previous document had 1 providers name and a different provider signed so that made it invalid  They will fax a new document today   [employer] [phone]

## 2024-10-17 NOTE — Transitions of Care (Post Inpatient/ED Visit) (Signed)
" ° °  10/17/2024  Name: Audrey Hill MRN: [id number] DOB: 10-24-1955  Today's TOC FU Call Status: Today's TOC FU Call Status:: Unsuccessful Call (3rd Attempt) Unsuccessful Call (3rd Attempt) Date: 10/17/24  Attempted to reach the patient regarding the most recent Inpatient/ED visit.  Follow Up Plan: No further outreach attempts will be made at this time. We have been unable to contact the patient.  Joseantonio Dittmar J. Terra Aveni RN, MSN [facility], [facility] RN Care Manager Direct Dial : [phone]  Fax: [phone] Website: [email or web address]   "

## 2024-10-18 ENCOUNTER — Other Ambulatory Visit: Payer: Self-pay | Admitting: *Deleted

## 2024-10-18 ENCOUNTER — Other Ambulatory Visit: Payer: Self-pay

## 2024-10-18 DIAGNOSIS — I10 Essential (primary) hypertension: Secondary | ICD-10-CM

## 2024-10-18 NOTE — Patient Instructions (Signed)
 Visit Information  Thank you for taking time to visit with me today. Please don't hesitate to contact me if I can be of assistance to you before our next scheduled appointment.  Your next care management appointment is by telephone on 11/27/24  at 3 pm.  Telephone follow-up in 1 month: 11/27/24 @ 3 pm.  Please call the care guide team at [phone] if you need to cancel, schedule, or reschedule an appointment.   Please call the Suicide and Crisis Lifeline: 988 call the [place]  National Suicide Prevention Lifeline: 7370758521 or TTY: 402-770-1067 TTY (754)214-1842) to talk to a trained counselor call 1-800-273-TALK (toll free, 24 hour hotline) go to [facility] [address] [place] [phone]) call the [facility] Crisis Line: [phone] call 911 if you are experiencing a Mental Health or Behavioral Health Crisis or need someone to talk to.  Jasiel Belisle, RN, BSN, Theatre Manager [employer] [phone]
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
## 2024-10-19 ENCOUNTER — Ambulatory Visit: Payer: Self-pay | Admitting: Student
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
## 2024-10-20 ENCOUNTER — Inpatient Hospital Stay (HOSPITAL_COMMUNITY)
Admission: EM | Admit: 2024-10-20 | Discharge: 2024-10-23 | DRG: 640 | Disposition: A | Discharge status: Home / Self Care

## 2024-10-20 ENCOUNTER — Other Ambulatory Visit: Payer: Self-pay

## 2024-10-20 ENCOUNTER — Emergency Department (HOSPITAL_COMMUNITY)

## 2024-10-20 ENCOUNTER — Encounter (HOSPITAL_COMMUNITY): Payer: Self-pay

## 2024-10-20 DIAGNOSIS — Z888 Allergy status to other drugs, medicaments and biological substances status: Secondary | ICD-10-CM

## 2024-10-20 DIAGNOSIS — Z9981 Dependence on supplemental oxygen: Secondary | ICD-10-CM

## 2024-10-20 DIAGNOSIS — Z86718 Personal history of other venous thrombosis and embolism: Secondary | ICD-10-CM

## 2024-10-20 DIAGNOSIS — I4891 Unspecified atrial fibrillation: Secondary | ICD-10-CM | POA: Diagnosis present

## 2024-10-20 DIAGNOSIS — I132 Hypertensive heart and chronic kidney disease with heart failure and with stage 5 chronic kidney disease, or end stage renal disease: Secondary | ICD-10-CM | POA: Diagnosis present

## 2024-10-20 DIAGNOSIS — I4581 Long QT syndrome: Secondary | ICD-10-CM | POA: Diagnosis present

## 2024-10-20 DIAGNOSIS — Z833 Family history of diabetes mellitus: Secondary | ICD-10-CM

## 2024-10-20 DIAGNOSIS — I272 Pulmonary hypertension, unspecified: Secondary | ICD-10-CM | POA: Diagnosis present

## 2024-10-20 DIAGNOSIS — R8271 Bacteriuria: Secondary | ICD-10-CM | POA: Diagnosis present

## 2024-10-20 DIAGNOSIS — D631 Anemia in chronic kidney disease: Secondary | ICD-10-CM | POA: Diagnosis present

## 2024-10-20 DIAGNOSIS — K5900 Constipation, unspecified: Secondary | ICD-10-CM | POA: Diagnosis present

## 2024-10-20 DIAGNOSIS — Z1152 Encounter for screening for COVID-19: Secondary | ICD-10-CM

## 2024-10-20 DIAGNOSIS — N186 End stage renal disease: Secondary | ICD-10-CM | POA: Diagnosis present

## 2024-10-20 DIAGNOSIS — I10 Essential (primary) hypertension: Secondary | ICD-10-CM | POA: Diagnosis present

## 2024-10-20 DIAGNOSIS — G894 Chronic pain syndrome: Secondary | ICD-10-CM | POA: Diagnosis present

## 2024-10-20 DIAGNOSIS — Z96652 Presence of left artificial knee joint: Secondary | ICD-10-CM | POA: Diagnosis present

## 2024-10-20 DIAGNOSIS — Z79899 Other long term (current) drug therapy: Secondary | ICD-10-CM

## 2024-10-20 DIAGNOSIS — E871 Hypo-osmolality and hyponatremia: Secondary | ICD-10-CM | POA: Diagnosis not present

## 2024-10-20 DIAGNOSIS — D638 Anemia in other chronic diseases classified elsewhere: Secondary | ICD-10-CM | POA: Diagnosis present

## 2024-10-20 DIAGNOSIS — E1122 Type 2 diabetes mellitus with diabetic chronic kidney disease: Secondary | ICD-10-CM | POA: Diagnosis present

## 2024-10-20 DIAGNOSIS — G8929 Other chronic pain: Secondary | ICD-10-CM

## 2024-10-20 DIAGNOSIS — I480 Paroxysmal atrial fibrillation: Secondary | ICD-10-CM | POA: Diagnosis present

## 2024-10-20 DIAGNOSIS — K219 Gastro-esophageal reflux disease without esophagitis: Secondary | ICD-10-CM | POA: Diagnosis present

## 2024-10-20 DIAGNOSIS — E8770 Fluid overload, unspecified: Principal | ICD-10-CM | POA: Diagnosis present

## 2024-10-20 DIAGNOSIS — J9611 Chronic respiratory failure with hypoxia: Secondary | ICD-10-CM | POA: Diagnosis present

## 2024-10-20 DIAGNOSIS — Z8249 Family history of ischemic heart disease and other diseases of the circulatory system: Secondary | ICD-10-CM

## 2024-10-20 DIAGNOSIS — R7989 Other specified abnormal findings of blood chemistry: Secondary | ICD-10-CM | POA: Insufficient documentation

## 2024-10-20 DIAGNOSIS — I5022 Chronic systolic (congestive) heart failure: Secondary | ICD-10-CM | POA: Diagnosis present

## 2024-10-20 DIAGNOSIS — I5020 Unspecified systolic (congestive) heart failure: Secondary | ICD-10-CM | POA: Diagnosis present

## 2024-10-20 DIAGNOSIS — Z992 Dependence on renal dialysis: Secondary | ICD-10-CM

## 2024-10-20 DIAGNOSIS — E785 Hyperlipidemia, unspecified: Secondary | ICD-10-CM | POA: Diagnosis present

## 2024-10-20 DIAGNOSIS — Z886 Allergy status to analgesic agent status: Secondary | ICD-10-CM

## 2024-10-20 DIAGNOSIS — Z79891 Long term (current) use of opiate analgesic: Secondary | ICD-10-CM

## 2024-10-20 LAB — URINALYSIS, ROUTINE W REFLEX MICROSCOPIC
Bilirubin Urine: NEGATIVE
Glucose, UA: NEGATIVE mg/dL
Ketones, ur: NEGATIVE mg/dL
Nitrite: NEGATIVE
Protein, ur: 100 mg/dL — AB
Specific Gravity, Urine: 1.004 — ABNORMAL LOW (ref 1.005–1.030)
pH: 9 — ABNORMAL HIGH (ref 5.0–8.0)

## 2024-10-20 LAB — COMPREHENSIVE METABOLIC PANEL WITH GFR
ALT: 27 U/L (ref 0–44)
AST: 29 U/L (ref 15–41)
Albumin: 4 g/dL (ref 3.5–5.0)
Alkaline Phosphatase: 163 U/L — ABNORMAL HIGH (ref 38–126)
Anion gap: 12 (ref 5–15)
BUN: 34 mg/dL — ABNORMAL HIGH (ref 8–23)
CO2: 29 mmol/L (ref 22–32)
Calcium: 9.8 mg/dL (ref 8.9–10.3)
Chloride: 96 mmol/L — ABNORMAL LOW (ref 98–111)
Creatinine, Ser: 6.29 mg/dL — ABNORMAL HIGH (ref 0.44–1.00)
GFR, Estimated: 7 mL/min — ABNORMAL LOW
Glucose, Bld: 78 mg/dL (ref 70–99)
Potassium: 4.5 mmol/L (ref 3.5–5.1)
Sodium: 137 mmol/L (ref 135–145)
Total Bilirubin: 0.4 mg/dL (ref 0.0–1.2)
Total Protein: 7.7 g/dL (ref 6.5–8.1)

## 2024-10-20 LAB — HEPATITIS B SURFACE ANTIGEN: Hepatitis B Surface Ag: NONREACTIVE

## 2024-10-20 LAB — RESP PANEL BY RT-PCR (RSV, FLU A&B, COVID)  RVPGX2
Influenza A by PCR: NEGATIVE
Influenza B by PCR: NEGATIVE
Resp Syncytial Virus by PCR: NEGATIVE
SARS Coronavirus 2 by RT PCR: NEGATIVE

## 2024-10-20 LAB — CBC
HCT: 26.6 % — ABNORMAL LOW (ref 36.0–46.0)
Hemoglobin: 8.4 g/dL — ABNORMAL LOW (ref 12.0–15.0)
MCH: 26.8 pg (ref 26.0–34.0)
MCHC: 31.6 g/dL (ref 30.0–36.0)
MCV: 84.7 fL (ref 80.0–100.0)
Platelets: 182 K/uL (ref 150–400)
RBC: 3.14 MIL/uL — ABNORMAL LOW (ref 3.87–5.11)
RDW: 19.1 % — ABNORMAL HIGH (ref 11.5–15.5)
WBC: 5.3 K/uL (ref 4.0–10.5)
nRBC: 0 % (ref 0.0–0.2)

## 2024-10-20 LAB — I-STAT CG4 LACTIC ACID, ED
Lactic Acid, Venous: 0.6 mmol/L (ref 0.5–1.9)
Lactic Acid, Venous: 1.1 mmol/L (ref 0.5–1.9)

## 2024-10-20 LAB — LIPASE, BLOOD: Lipase: 32 U/L (ref 11–51)

## 2024-10-20 LAB — POC OCCULT BLOOD, ED: Fecal Occult Bld: NEGATIVE

## 2024-10-20 LAB — HIV ANTIBODY (ROUTINE TESTING W REFLEX): HIV Screen 4th Generation wRfx: NONREACTIVE

## 2024-10-20 LAB — TROPONIN T, HIGH SENSITIVITY: Troponin T High Sensitivity: 131 ng/L (ref 0–19)

## 2024-10-20 MED ORDER — SEVELAMER CARBONATE 800 MG PO TABS
800.0000 mg | ORAL_TABLET | Freq: Three times a day (TID) | ORAL | Status: DC
  Administered 2024-10-21 – 2024-10-23 (×7): 800 mg via ORAL
  Filled 2024-10-20 (×7): qty 1

## 2024-10-20 MED ORDER — HYDROMORPHONE HCL 2 MG PO TABS: 1.0000 mg | ORAL_TABLET | ORAL | Status: DC | PRN

## 2024-10-20 MED ORDER — ISOSORBIDE DINITRATE 20 MG PO TABS
20.0000 mg | ORAL_TABLET | Freq: Three times a day (TID) | ORAL | Status: DC
  Administered 2024-10-20 – 2024-10-23 (×7): 20 mg via ORAL
  Filled 2024-10-20 (×5): qty 1
  Filled 2024-10-20: qty 2
  Filled 2024-10-20 (×2): qty 1
  Filled 2024-10-20: qty 2
  Filled 2024-10-20 (×2): qty 1

## 2024-10-20 MED ORDER — LIDOCAINE 5 % EX PTCH: 1.0000 | MEDICATED_PATCH | Freq: Every day | CUTANEOUS | Status: DC | PRN

## 2024-10-20 MED ORDER — TRIMETHOBENZAMIDE HCL 100 MG/ML IM SOLN
200.0000 mg | Freq: Three times a day (TID) | INTRAMUSCULAR | Status: DC | PRN
  Filled 2024-10-20: qty 2

## 2024-10-20 MED ORDER — AMIODARONE HCL 200 MG PO TABS
200.0000 mg | ORAL_TABLET | Freq: Every day | ORAL | Status: DC
  Administered 2024-10-20 – 2024-10-23 (×4): 200 mg via ORAL
  Filled 2024-10-20 (×4): qty 1

## 2024-10-20 MED ORDER — PANTOPRAZOLE SODIUM 40 MG PO TBEC
40.0000 mg | DELAYED_RELEASE_TABLET | Freq: Every day | ORAL | Status: DC
  Administered 2024-10-20: 40 mg via ORAL
  Filled 2024-10-20: qty 1

## 2024-10-20 MED ORDER — FENTANYL CITRATE (PF) 50 MCG/ML IJ SOSY
50.0000 ug | PREFILLED_SYRINGE | Freq: Once | INTRAMUSCULAR | Status: AC
  Administered 2024-10-20: 50 ug via INTRAVENOUS
  Filled 2024-10-20: qty 1

## 2024-10-20 MED ORDER — RENA-VITE PO TABS
1.0000 | ORAL_TABLET | Freq: Every day | ORAL | Status: DC
  Administered 2024-10-20 – 2024-10-23 (×5): 1 via ORAL
  Filled 2024-10-20 (×6): qty 1

## 2024-10-20 MED ORDER — SENNA 8.6 MG PO TABS
2.0000 | ORAL_TABLET | Freq: Every day | ORAL | Status: DC
  Administered 2024-10-21: 17.2 mg via ORAL
  Filled 2024-10-20: qty 2

## 2024-10-20 MED ORDER — SENNA 8.6 MG PO TABS: 2.0000 | ORAL_TABLET | Freq: Every day | ORAL | Status: DC | PRN

## 2024-10-20 MED ORDER — LIDOCAINE HCL (PF) 1 % IJ SOLN: 5.0000 mL | INTRAMUSCULAR | Status: DC | PRN

## 2024-10-20 MED ORDER — ATORVASTATIN CALCIUM 40 MG PO TABS
40.0000 mg | ORAL_TABLET | Freq: Every day | ORAL | Status: DC
  Administered 2024-10-20 – 2024-10-23 (×4): 40 mg via ORAL
  Filled 2024-10-20 (×4): qty 1

## 2024-10-20 MED ORDER — IOHEXOL 350 MG/ML SOLN
75.0000 mL | Freq: Once | INTRAVENOUS | Status: AC | PRN
  Administered 2024-10-20: 75 mL via INTRAVENOUS

## 2024-10-20 MED ORDER — HEPARIN SODIUM (PORCINE) 5000 UNIT/ML IJ SOLN: 5000.0000 [IU] | Freq: Three times a day (TID) | INTRAMUSCULAR | Status: DC

## 2024-10-20 MED ORDER — HYDRALAZINE HCL 50 MG PO TABS
75.0000 mg | ORAL_TABLET | Freq: Three times a day (TID) | ORAL | Status: DC
  Administered 2024-10-20 – 2024-10-23 (×8): 75 mg via ORAL
  Filled 2024-10-20: qty 1
  Filled 2024-10-20: qty 3
  Filled 2024-10-20 (×2): qty 1
  Filled 2024-10-20: qty 3
  Filled 2024-10-20 (×3): qty 1

## 2024-10-20 MED ORDER — ACETAMINOPHEN 500 MG PO TABS: 500.0000 mg | ORAL_TABLET | Freq: Four times a day (QID) | ORAL | Status: DC | PRN

## 2024-10-20 MED ORDER — FUROSEMIDE 10 MG/ML IJ SOLN
80.0000 mg | Freq: Once | INTRAMUSCULAR | Status: AC
  Administered 2024-10-20: 80 mg via INTRAVENOUS
  Filled 2024-10-20: qty 8

## 2024-10-20 MED ORDER — DICLOFENAC SODIUM 1 % EX GEL
4.0000 g | Freq: Four times a day (QID) | CUTANEOUS | Status: DC
  Administered 2024-10-20 – 2024-10-22 (×7): 4 g via TOPICAL
  Filled 2024-10-20 (×2): qty 100

## 2024-10-20 MED ORDER — HYDROMORPHONE HCL 1 MG/ML IJ SOLN
0.5000 mg | INTRAMUSCULAR | Status: DC | PRN
  Administered 2024-10-21 – 2024-10-22 (×8): 0.5 mg via INTRAVENOUS
  Filled 2024-10-20: qty 1
  Filled 2024-10-20 (×3): qty 0.5
  Filled 2024-10-20 (×2): qty 1
  Filled 2024-10-20: qty 0.5

## 2024-10-20 MED ORDER — AMIODARONE HCL 200 MG PO TABS: 200.0000 mg | ORAL_TABLET | Freq: Every day | ORAL | Status: DC

## 2024-10-20 MED ORDER — HYDROMORPHONE HCL 2 MG PO TABS
2.0000 mg | ORAL_TABLET | Freq: Two times a day (BID) | ORAL | Status: DC
  Administered 2024-10-20 – 2024-10-23 (×6): 2 mg via ORAL
  Filled 2024-10-20 (×5): qty 1

## 2024-10-20 MED ORDER — POLYETHYLENE GLYCOL 3350 17 G PO PACK
17.0000 g | PACK | Freq: Every day | ORAL | Status: DC
  Administered 2024-10-20 – 2024-10-23 (×4): 17 g via ORAL
  Filled 2024-10-20 (×5): qty 1

## 2024-10-20 NOTE — ED Triage Notes (Signed)
 Pt comming in from dialysis. Pt was at [facility] then went to dialysis. Pt is having increasing urinary frequency, epigastric pain and shortness of breath. Pt has 6l of access fluid but dialysis was only able to get off 0.7 L . Pt gets dialysis tues thurs Saturday and has not missed a session.  Ems vitals 184/86 Hr 86 Spo2 99%  Cbg 90  Temp 99.1 Rr 30
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
## 2024-10-20 NOTE — ED Provider Notes (Signed)
 " [facility] Provider Note   CSN: [id number] Arrival date & time: 10/20/24  1309     Patient presents with: Shortness of Breath and Abdominal Pain   Audrey Hill is a 69 y.o. female.   HPI     69 year old patient with history of ESRD on hemodialysis TTS, CHF with EF of 40%, A-fib not on anticoagulation because of GI bleed, chronic hypoxic respiratory failure on 3 L of oxygen , diabetes, pulmonary hypertension comes in with chief complaint of abdominal discomfort.  Patient reports that over the last 2 weeks she has been having episodes of abdominal pain that are more persistent and severe.  Pain is generalized.  She feels bloated and she thinks that there is some fluid building up in her abdomen.  Patient has no appetite.  She is also having shortness of breath, typically worse when she is laying flat and with activity.  Patient reports that last night she woke up sweating at 1 point.  Abdominal pain is described as sharp and stabbing pain that is nonradiating.  Patient was getting dialysis today.  She was told that she is 6 L volume overloaded, about 500 or 700 cc were removed when she started having severe pain and she was sent to the emergency room.  Prior to Admission medications  Medication Sig Start Date End Date Taking? Authorizing Provider  albuterol  (VENTOLIN  HFA) 108 (90 Base) MCG/ACT inhaler INHALE 2 PUFFS BY MOUTH EVERY 6 HOURS AS NEEDED FOR COUGH 03/13/24   [name], DO  amiodarone  (PACERONE ) 200 MG tablet TAKE 1 TABLET BY MOUTH DAILY 09/11/24   [name], DO  atorvastatin  (LIPITOR) 40 MG tablet TAKE 1 TABLET BY MOUTH DAILY 08/09/24   [name], DO  diclofenac  Sodium (VOLTAREN ) 1 % GEL Apply 4 g topically 4 (four) times daily. 12/09/23   Amoako, Prince, MD  famotidine  (PEPCID ) 20 MG tablet Take 20 mg by mouth 2 (two) times daily. 09/28/24 10/28/24  [provider]  ferrous sulfate 325 (65 FE) MG tablet Take 325  mg by mouth every other day.    [provider]  furosemide  (LASIX ) 80 MG tablet Take 80mg  once in the morning on non-dialysis days. 10/15/24   Smucker, Melvenia, MD  hydrALAZINE  (APRESOLINE ) 50 MG tablet Take 1.5 tablets (75 mg total) by mouth 3 (three) times daily. 10/15/24   Smucker, Melvenia, MD  HYDROmorphone  (DILAUDID ) 2 MG tablet Take 1 tablet (2 mg total) by mouth every 12 (twelve) hours. 10/10/24   Amoako, Prince, MD  isosorbide  dinitrate (ISORDIL ) 20 MG tablet TAKE 1 TABLET (20 MG TOTAL) BY MOUTH 3 (THREE) TIMES DAILY. 04/12/24   Rosan Dayton BROCKS, DO  lidocaine  (LIDODERM ) 5 % Place 1 patch onto the skin daily as needed. Remove & Discard patch within 12 hours or as directed by MD 09/14/24   Edgardo Pontiff, DO  losartan  (COZAAR ) 50 MG tablet TAKE 1 TABLET BY MOUTH DAILY 09/11/24   [name], DO  Methoxy PEG-Epoetin  Beta (MIRCERA IJ) 100 mcg. 04/17/24 04/16/25  [provider]  multivitamin (RENA-VIT) TABS tablet TAKE 1 TABLET BY MOUTH DAILY. 07/11/24   [name], DO  pantoprazole  (PROTONIX ) 40 MG tablet TAKE 1 TABLET BY MOUTH EVERY DAY 06/12/24   [name], DO  polyethylene glycol (MIRALAX  / GLYCOLAX ) 17 g packet Take 17 g by mouth daily.    [provider]  senna (SENOKOT) 8.6 MG TABS tablet Take 2 tablets by mouth daily as needed for  mild constipation. 12/19/23   [provider]  sevelamer  carbonate (RENVELA ) 800 MG tablet Take 1 tablet (800 mg total) by mouth 3 (three) times daily with meals. 02/09/24   [name], DO  triamcinolone (KENALOG) 0.025 % ointment Apply 1 Application topically daily as needed (shin). 12/13/23   [provider]    Allergies: Diphenhydramine  hcl, Lisinopril, Metoclopramide , Nortriptyline, Tramadol, Nitroglycerin , Tylenol  [acetaminophen ], Ibuprofen, Norvasc  [amlodipine  besylate], and Nsaids    Review of Systems  All other systems reviewed and are negative.   Updated Vital Signs BP (!) 162/78   Pulse 79   Temp 98.9 F  (37.2 C) (Oral)   Resp (!) 32   Ht 5' 6 (1.676 m)   Wt 75 kg   SpO2 100%   BMI 26.69 kg/m   Physical Exam Vitals and nursing note reviewed.  Constitutional:      Appearance: She is well-developed. She is ill-appearing. She is not toxic-appearing.  HENT:     Head: Atraumatic.  Cardiovascular:     Rate and Rhythm: Normal rate.  Pulmonary:     Effort: Pulmonary effort is normal.     Breath sounds: Examination of the right-lower field reveals rales. Examination of the left-lower field reveals rales. Rales present. No decreased breath sounds.  Abdominal:     Palpations: Abdomen is soft.     Comments: Generalized abdominal tenderness without rebound or guarding  Musculoskeletal:     Cervical back: Normal range of motion and neck supple.     Right lower leg: No edema.     Left lower leg: No edema.  Skin:    General: Skin is warm and dry.  Neurological:     Mental Status: She is alert and oriented to person, place, and time.     (all labs ordered are listed, but only abnormal results are displayed) Labs Reviewed  COMPREHENSIVE METABOLIC PANEL WITH GFR - Abnormal; Notable for the following components:      Result Value   Chloride 96 (*)    BUN 34 (*)    Creatinine, Ser 6.29 (*)    Alkaline Phosphatase 163 (*)    GFR, Estimated 7 (*)    All other components within normal limits  CBC - Abnormal; Notable for the following components:   RBC 3.14 (*)    Hemoglobin 8.4 (*)    HCT 26.6 (*)    RDW 19.1 (*)    All other components within normal limits  URINALYSIS, ROUTINE W REFLEX MICROSCOPIC - Abnormal; Notable for the following components:   APPearance CLOUDY (*)    Specific Gravity, Urine 1.004 (*)    pH 9.0 (*)    Hgb urine dipstick SMALL (*)    Protein, ur 100 (*)    Leukocytes,Ua TRACE (*)    Bacteria, UA MANY (*)    All other components within normal limits  RESP PANEL BY RT-PCR (RSV, FLU A&B, COVID)  RVPGX2  LIPASE, BLOOD  I-STAT CG4 LACTIC ACID, ED  I-STAT CG4  LACTIC ACID, ED    EKG: None  Radiology: [facility] Chest Port 1 View Result Date: 10/20/2024 CLINICAL DATA:  Shortness of breath. EXAM: PORTABLE CHEST 1 VIEW COMPARISON:  10/08/2024. FINDINGS: The heart is enlarged and the mediastinal contour is within normal limits. The pulmonary vasculature is distended. Interstitial prominence is present bilaterally with mild airspace disease at the lung bases. Possible trace bilateral pleural effusions. No pneumothorax is seen. Stable radiopaque densities are present over the left upper chest. No acute osseous abnormality.  IMPRESSION: 1. Cardiomegaly with pulmonary vascular congestion. 2. Interstitial prominence bilaterally with mild airspace disease at the lung bases, possible edema or infiltrate. 3. Possible trace bilateral pleural effusions. Electronically Signed   By: Leita Birmingham M.D.   On: 10/20/2024 15:49     Procedures   Medications Ordered in the ED  fentaNYL  (SUBLIMAZE ) injection 50 mcg (has no administration in time range)  fentaNYL  (SUBLIMAZE ) injection 50 mcg (50 mcg Intravenous Given 10/20/24 1613)  fentaNYL  (SUBLIMAZE ) injection 50 mcg (50 mcg Intravenous Given 10/20/24 1733)    Clinical Course as of 10/20/24 1943  Sat Oct 20, 2024  1501 Patient had CT abdomen pelvis with contrast in September 2025.  Subsequently she has had multiple noncontrasted CT abdomen and pelvis.  CT showed anasarca, nonspecific bowel wall edema.  She had a CT PE in October which was negative for blood clots. [AN]    Clinical Course User Index [AN] Charlyn Sora, MD                                 Medical Decision Making Amount and/or Complexity of Data Reviewed Labs: ordered. Radiology: ordered.  Risk Prescription drug management.   This patient presents to the ED with chief complaint(s) of shortness of breath, abdominal pain with diaphoresis pertinent past medical history of ESRD on hemodialysis, chronic hypoxic respiratory failure, hypertension. The  complaint involves an extensive differential diagnosis and also carries with it a high risk of complications and morbidity.    Additional history obtained from reviewing patient's records.  Patient has been admitted to outside hospital multiple times.  The differential diagnosis includes  : Chronic mesenteric ischemia leading to abdominal pain, volume overload, pulmonary edema, pleural effusion, severe electrolyte abnormality.  Patient's abdominal exam does not show any peritonitis.  The initial management included getting basic labs, chest x-ray.  Patient does have bibasilar rales.  She also reports that she was told she is about 7 L volume overloaded and only find it cc were removed today.   Reassessments:  The following labs were independently interpreted: Lactate is normal, CBC is normal.  I independently visualized the following imaging with scope of interpretation limited to determining acute life threatening conditions related to emergency care: X-ray of the chest, which revealed no evidence of pneumothorax.  Patient has some evidence of interstitial edema.  Treatment and Reassessment: Patient reassessed.  She still feels very short of breath.  I think she has some volume overloaded, and will benefit with admission and dialysis.   I considered CT abdomen and CT chest, however patient has had multiple scans and it appears that her symptoms are chronic in nature.  Shortness of breath is likely because of volume overload.  Will not image at this time.  Consultation: - Consulted or discussed management/test interpretation with external professional: Nephrologist, Dr. Marlee has been made aware.     Final diagnoses:  Hypervolemia, unspecified hypervolemia type  ESRD (end stage renal disease) on dialysis [facility])  Chronic abdominal pain    ED Discharge Orders     None          Charlyn Sora, MD 10/20/24 1943  "

## 2024-10-20 NOTE — ED Provider Notes (Signed)
 Emergency Department Provider Note   History   Chief Complaint  Patient presents with   Shortness of Breath   Abdominal Pain      History provided by:  Patient Shortness of Breath Severity:  Moderate Onset quality:  Gradual Duration:  1 day Timing:  Constant Progression:  Unchanged Chronicity:  Recurrent Relieved by:  Nothing Worsened by:  Nothing Ineffective treatments:  Inhaler Associated symptoms: abdominal pain   Associated symptoms: no chest pain      Past Medical History Medical History[1]   Past Surgical History Surgical History[2]    Medications Current Outpatient Medications  Medication Instructions   albuterol  HFA (PROVENTIL  HFA;VENTOLIN  HFA;PROAIR  HFA) 90 mcg/actuation inhaler 2 puffs, inhalation, Every 6 hours PRN   amiodarone  (PACERONE ) 200 mg, oral, Daily   atorvastatin  (LIPITOR) 40 mg, Daily   calcium  acetate (CALIPHRON) 667 mg tab tablet TAKE 1 TABLET BY MOUTH THREE TIMES DAILY WITH MEALS AND ONE WITH A SNACK ONCE DAILY   carvedilol  (COREG ) 3.125 mg, oral, 2 times daily with meals   diclofenac  sodium (VOLTAREN ) 1 % gel Apply to left shoulder and neck   famotidine  (PEPCID ) 20 mg, oral, 2 times daily   ferrous sulfate 325 mg, oral, Every other day   furosemide  (LASIX ) 80 mg, oral, 4 times weekly, Take 1 tablet M,W,F,Sun (non HD days)   guaiFENesin  (MUCINEX ) 1,200 mg, Daily PRN   guaiFENesin  (ROBITUSSIN) 100 mg/5 mL syrup 20 mL, oral, Daily PRN   hydrALAZINE  (APRESOLINE ) 75 mg, oral, 3 times daily   HYDROmorphone  (DILAUDID ) 2 mg, oral, 3 times daily   isosorbide  dinitrate (ISORDIL ) 20 mg, oral, 3 times daily   Lidoderm  5 % patch 2 patches, Daily   loratadine  (CLARITIN ) 10 mg, Daily   [Paused] losartan  (COZAAR ) 50 mg, oral, 4 times weekly, Take 1 tablet M,W,F,Sun (non HD days) at NIGHT   [Paused] mirtazapine  (REMERON ) 15 mg, At bedtime   pantoprazole  (PROTONIX ) 40 mg, oral, 2 times daily   polyethylene glycol (GLYCOLAX ) 17 g,  Daily PRN   prochlorperazine  (COMPAZINE ) 5 mg, oral, Every 6 hours PRN   Rena-Vite 0.8 mg tab tablet 1 tablet, At bedtime   senna (SENOKOT) 8.6 mg tablet 2 tablets, oral, Daily PRN   [Paused] sertraline  (ZOLOFT ) 25 mg, oral, Daily   sucralfate  (CARAFATE ) 1 g, oral, 4 times daily before meals and nightly   Vitamin C 500 mg, oral, Every other day, Take with ferrous sulfate      Allergies Allergies[3]   Family History Family History[4]   Social History Social History   Socioeconomic History   Marital status: Single    Spouse name: Not on file   Number of children: Not on file   Years of education: Not on file   Highest education level: Not on file  Occupational History   Not on file  Tobacco Use   Smoking status: Former   Smokeless tobacco: Never  Substance and Sexual Activity   Alcohol  use: No   Drug use: Yes    Types: Crack cocaine   Sexual activity: Not on file  Other Topics Concern   Not on file  Social History Narrative   Not on file   Social Drivers of Health   Living Situation: Low Risk (10/18/2024)   Received from [facility]   Living Situation    In the last 12 months, was there a time when you were not able to pay the mortgage or rent on time?: No    In the past 12  months, how many times have you moved where you were living?: 0    At any time in the past 12 months, were you homeless or living in a shelter (including now)?: No  Recent Concern: Living Situation - Medium Risk (09/25/2024)   Living Situation    What is your living situation today?: I have a steady place to live    Think about the place you live. Do you have problems with any of the following? Choose all that apply:: Mold  Food Insecurity: Food Insecurity Present (10/18/2024)   Received from [facility]   Food vital sign    Within the past 12 months, you worried that your food would run out before you got money to buy more: Often true    Within the past 12 months,  the food you bought just didn't last and you didn't have money to get more: Often true  Transportation Needs: No Transportation Needs (10/18/2024)   Received from [facility] - Transportation    In the past 12 months, has lack of transportation kept you from medical appointments or from getting medications?: No    In the past 12 months, has lack of transportation kept you from meetings, work, or from getting things needed for daily living?: No  Recent Concern: Transportation Needs - Unmet Transportation Needs (10/11/2024)   Transportation    In the past 12 months, has lack of reliable transportation kept you from medical appointments, meetings, work or from getting things needed for daily living? : Yes  Utilities: Low Risk (10/11/2024)   Utilities    In the past 12 months has the electric, gas, oil, or water company threatened to shut off services in your home? : No  Recent Concern: Utilities - Medium Risk (09/25/2024)   Utilities    In the past 12 months has the electric, gas, oil, or water company threatened to shut off services in your home? : Yes  Safety: Not At Risk (10/18/2024)   Received from [facility]   Safety    Within the last year, have you been afraid of your partner or ex-partner?: No    Within the last year, have you been humiliated or emotionally abused in other ways by your partner or ex-partner?: No    Within the last year, have you been kicked, hit, slapped, or otherwise physically hurt by your partner or ex-partner?: No    Within the last year, have you been raped or forced to have any kind of sexual activity by your partner or ex-partner?: No  Alcohol  Screening: Not At Risk (09/24/2024)   Alcohol     Audit C Alcohol  risk score: 0  Tobacco Use: Medium Risk (10/08/2024)   Received from [facility]   Patient History    Smoking Tobacco Use: Former    Smokeless Tobacco Use: Never    Passive Exposure: Not on file  Depression: Not At Risk (04/16/2024)    PHQ-2    PHQ-2 Score: 0  Social Connections: Unknown (10/11/2024)   Social Connection and Isolation Panel    Frequency of Communication with Friends and Family: More than three times a week    Frequency of Social Gatherings with Friends and Family: Not on file    Attends Religious Services: Not on file    Active Member of Clubs or Organizations: Not on file    Attends Banker Meetings: Not on file    Marital Status: Not on file  Recent Concern: Social Connections -  Socially Isolated (10/02/2024)   Social Connection and Isolation Panel    Frequency of Communication with Friends and Family: More than three times a week    Frequency of Social Gatherings with Friends and Family: More than three times a week    Attends Religious Services: Never    Database Administrator or Organizations: No    Attends Engineer, Structural: Never    Marital Status: Never married  Physicist, Medical Strain: Low Risk (10/11/2024)   Overall Financial Resource Strain (CARDIA)    Difficulty of Paying Living Expenses: Not hard at all  Recent Concern: Financial Resource Strain - Medium Risk (09/25/2024)   Overall Financial Resource Strain (CARDIA)    Difficulty of Paying Living Expenses: Somewhat hard      Review of Systems  Review of Systems  Respiratory:  Positive for shortness of breath.   Cardiovascular:  Negative for chest pain.  Gastrointestinal:  Positive for abdominal pain.     Physical Exam   ED Triage Vitals [10/20/24 0326]  Temp 98.4 F (36.9 C)  Heart Rate 82  Resp 14  BP (!) 179/86  MAP (mmHg) 112  SpO2 100 %  O2 Device Nasal cannula  O2 Flow Rate (L/min) 3 L/min  Weight 76.7 kg (169 lb 1.5 oz)    Physical Exam Constitutional:      General: She is not in acute distress.    Appearance: Normal appearance. She is ill-appearing.  HENT:     Head: Atraumatic.     Mouth/Throat:     Mouth: Mucous membranes are moist.  Eyes:     Extraocular Movements:  Extraocular movements intact.  Cardiovascular:     Rate and Rhythm: Normal rate and regular rhythm.  Pulmonary:     Effort: Pulmonary effort is normal. No respiratory distress.     Breath sounds: No stridor. No wheezing or rales.  Abdominal:     General: Abdomen is flat. There is no distension.  Musculoskeletal:        General: No swelling or deformity.     Cervical back: Normal range of motion.     Right lower leg: No edema.     Left lower leg: No edema.  Skin:    General: Skin is warm and dry.  Neurological:     Mental Status: She is alert and oriented to person, place, and time. Mental status is at baseline.  Psychiatric:        Mood and Affect: Mood normal.     Labs   Abnormal Labs Reviewed  B-TYPE NATRIURETIC PEPTIDE (BNP) - Abnormal; Notable for the following components:      Result Value   B-Type Natriuretic Peptide (BNP) 1,376 (*)    All other components within normal limits  TROPONIN, HIGH SENSITIVE X 2 (0 HOUR BASELINE + 2 HOUR) - Abnormal; Notable for the following components:   Troponin, High Sensitive 86 (*)    All other components within normal limits  BASIC METABOLIC PANEL - Abnormal; Notable for the following components:   Blood Urea  Nitrogen (BUN) 42 (*)    Creatinine 7.97 (*)    eGFR 5 (*)    BUN/Creatinine Ratio 5.3 (*)    All other components within normal limits  CBC WITH DIFFERENTIAL - Abnormal; Notable for the following components:   RBC 3.02 (*)    Hemoglobin 8.1 (*)    Hematocrit 24.4 (*)    Mean Corpuscular Hemoglobin (MCH) 26.9 (*)    Red Cell Distribution Width (RDW)  19.9 (*)    Lymphocytes # 0.70 (*)    Monocytes # 0.90 (*)    All other components within normal limits    Labs independently reviewed by myself and considered in medical decision making.  Radiology  Chest x-ray result pending from radiology however I have reviewed and this appears consistent with pulmonary vascular congestion similar to x-ray on 10/13/2024.  Procedure Note   Procedures  Medical Decision Making   Medical Decision Making Patient is a very pleasant 69 year old female with history as above notable for ESRD on Tuesday, Thursday Saturday dialysis.  She presents today for evaluation of shortness of breath.  She is scheduled to go to dialysis later this morning.  She endorses abdominal pain which is fairly chronic for her.  She denies any fever.  On arrival here, patient is tachypneic but she is saturating normally on 3 L.  She wears oxygen  as needed at home typically at 3 L/min.  She has no wheezing or rhonchi.  She does not have any peripheral edema.  Blood pressure is elevated.  Differential diagnosis includes pulmonary edema, pneumonia, pneumothorax.  Labs obtained which are notable for normal white blood cell count.  Her troponin and BNP are elevated which appear to be within her baseline range.  She is not having any active chest pain at this time.  Her electrolytes are overall reassuring.  She does not have any indication for hospitalization and emergent dialysis.  However, she will certainly need her dialysis session this morning as I suspect her symptoms are coming from volume overload.  Given that she is on her baseline O2 requirement, I think she can safely be discharged to go to her scheduled dialysis later this morning.  Problems Addressed: ESRD (end stage renal disease) (CMD): complicated acute illness or injury Shortness of breath: complicated acute illness or injury  Amount and/or Complexity of Data Reviewed External Data Reviewed: labs, radiology and notes.    Details: Reviewed hospital discharge summary from 10/13/2024 as well as chest x-ray from that time.  Chest x-ray today appears similar. Labs: ordered. Decision-making details documented in ED Course. Radiology: ordered. ECG/medicine tests: ordered.     ED Clinical Impression   1. Shortness of breath   2. ESRD (end stage renal disease) (CMD)      ED Disposition     ED  Disposition  Discharge   Condition  Stable   Comment  --           New Prescriptions   No medications on file      FOLLOW UP No follow-up provider specified.      [1] Past Medical History: Diagnosis Date   Acute on chronic combined systolic and diastolic CHF (congestive heart failure)    (CMD) 03/11/2017   Arrhythmia    Afib   Asthma, unspecified asthma severity, unspecified whether complicated, unspecified whether persistent (CMD)    Med Hx: Asthma;    Diabetes type 2, uncontrolled    History of DVT (deep vein thrombosis)    History of seizures    History of stroke    Hyperlipidemia    Hypertension    Low back pain radiating to left leg 01/29/2013   Renal disorder    Stroke    (CMD)    Transfusion history   [2] Past Surgical History: Procedure Laterality Date   BACK SURGERY     Procedure: BACK SURGERY   HYSTERECTOMY      Procedure: HYSTERECTOMY   JOINT REPLACEMENT  Procedure: JOINT REPLACEMENT   KNEE SURGERY     Procedure: KNEE SURGERY   OTHER SURGICAL HISTORY Bilateral 05/14/2024   INJECTION ISCHIALGLUTEAL BURSA Bilat Ischial bursa performed by Shelly Charity, MD at [facility] OR   STEROID INJECTION HIP Bilateral 03/26/2024   Bilateral Greater Trochanteric Bursa Injection BL GTB performed by Shelly Charity, MD at [facility]  [3] Allergies Allergen Reactions   Acetaminophen  Itching    Tolerated numerous times in the past   Amlodipine  Swelling   Diphenhydramine  Hcl Other (See Comments)    mini stroke  Other reaction(s): Other (See Comments)  mini stroke (Benadryl )   Ibuprofen GI Intolerance   Metoclopramide  Other (See Comments)    Patient has dystonia with administration of drug.    Nortriptyline Other (See Comments)    Mini stroke   Tramadol GI Intolerance   Aspirin  Rash   Lisinopril Rash   Nitroglycerin  Rash  [4] Family History Problem Relation Name Age of Onset   Heart disease Mother      Stroke Other     Hypertension Other     Sleep apnea Neg Hx

## 2024-10-20 NOTE — ED Notes (Signed)
 Patient transported to CT
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
## 2024-10-20 NOTE — Hospital Course (Addendum)
 Audrey Hill is a 69 y.o. 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 She described this abdominal pain being different, sharper and seems to be more episodic. Not associated with meals. Bowels had been normal. Fecal occult negative. Abdominal exam benign with vague diffuse tenderness, lipase and LFTs wnl. Alk phos chronically elevated likely due to ESRD and related bone disease. CTAP w/o acute findings, she does have cholelithiasis and sigmoid diverticulosis. Ddx also included abdominal pain due to volume overload after an insufficient dialysis session, however did not improve with HD. RUQ showed GB with sludge. CTA abdomen and pelvis with and without contrast negative for mesenteric ischemia or visceral arterial abnormality, although MRA can be considered if still working differential later down the line. Patient's abdominal pain did not improve after significant bowel movements on bowel regimen. Patient was previously worked up for similar epigastric pain in 2017. She has known GERD and peptic ulcer disease, had a normal EGD and colonoscopy on 01/16/2016, was on Bentyl  previously for symptom control. Tried bentyl  without symptom improvement per patient. Discussed with patient that chronic opioid use can worsen gastric motility and may worsen PUD or constipation symptoms in the long term. Patient could also  not complete PUD workup due to protonix  use. Dced protonix  at dc and restarted famotidine . Encouraged follow up with provider with referrals to GI and General surgery for GB removal. Encouraged follow up with pain clinic.    ESRD on HD TTS HTN Chronic Normocytic Anemia  HFrecEF Left forearm AVF, she makes urine. Did not complete HD session on day of admission due to sudden onset abdominal pain. Systolics 190s. On exam she has JVD to the mandible and subjectively feels overloaded although no pitting edema or rales on auscultation appreciated. Stayed sating 100% on chronic 3 L [place]. Home regimen includes Lasix  80 mg on nonHD days, Carvedilol  3.125 mg daily, isosorbide  dinitrate 20 mg TID, hydralazine  75 mg TID held on HD days nephrology recently d/c her losartan . Recently admitted at [facility] for volume overload, troponins elevated with peak at 118, thought to be due to hypervolemia. Recently seen by [facility] 1/12 and weighed 165lbs then at during time of admission which is still increased from baseline several months ago. Low suspicion for HF exacerbation. Received HD x2, session one 3 L removed, session two 4 L removed. Received 1 units of PRBC during hospitalization.      Atrial Fibrillation Qtc Prolongation  NSR, Qtc 512 ms. Managed with amiodarone  200 mg daily. Not on AC due to GAVE.    Chronic pain syndrome  Follows with Dr. Fidel, has arthoplasty scheduled for 1/29. Managed with hydromorphone  2 mg Q 12 Hrs at home. Encouraged follow up with pain clinic.

## 2024-10-20 NOTE — ED Notes (Signed)
 Pt states she is unable to make urine at this time.  Refused to allow nurse to catheterize her. Pt states it hurt too bad when she is catheterized.

## 2024-10-20 NOTE — ED Triage Notes (Signed)
 Pt arrives from home via [facility]. Pt reports that about an hour ago began having abdominal pain and SHOB more than normal. Pt is a dialysis pt T/TH/Sat. Pt has not missed any treatments. Pt reports productive cough for a few days and chills.

## 2024-10-21 ENCOUNTER — Other Ambulatory Visit: Payer: Self-pay

## 2024-10-21 ENCOUNTER — Inpatient Hospital Stay (HOSPITAL_COMMUNITY)

## 2024-10-21 ENCOUNTER — Observation Stay (HOSPITAL_COMMUNITY)

## 2024-10-21 DIAGNOSIS — Z79899 Other long term (current) drug therapy: Secondary | ICD-10-CM | POA: Diagnosis not present

## 2024-10-21 DIAGNOSIS — F329 Major depressive disorder, single episode, unspecified: Secondary | ICD-10-CM | POA: Diagnosis not present

## 2024-10-21 DIAGNOSIS — R1013 Epigastric pain: Secondary | ICD-10-CM | POA: Diagnosis not present

## 2024-10-21 DIAGNOSIS — Z886 Allergy status to analgesic agent status: Secondary | ICD-10-CM | POA: Diagnosis not present

## 2024-10-21 DIAGNOSIS — I132 Hypertensive heart and chronic kidney disease with heart failure and with stage 5 chronic kidney disease, or end stage renal disease: Secondary | ICD-10-CM | POA: Diagnosis present

## 2024-10-21 DIAGNOSIS — E785 Hyperlipidemia, unspecified: Secondary | ICD-10-CM | POA: Diagnosis present

## 2024-10-21 DIAGNOSIS — E871 Hypo-osmolality and hyponatremia: Secondary | ICD-10-CM | POA: Diagnosis not present

## 2024-10-21 DIAGNOSIS — R8271 Bacteriuria: Secondary | ICD-10-CM | POA: Insufficient documentation

## 2024-10-21 DIAGNOSIS — I4891 Unspecified atrial fibrillation: Secondary | ICD-10-CM | POA: Diagnosis not present

## 2024-10-21 DIAGNOSIS — Z8249 Family history of ischemic heart disease and other diseases of the circulatory system: Secondary | ICD-10-CM | POA: Diagnosis not present

## 2024-10-21 DIAGNOSIS — I5022 Chronic systolic (congestive) heart failure: Secondary | ICD-10-CM | POA: Diagnosis present

## 2024-10-21 DIAGNOSIS — Z833 Family history of diabetes mellitus: Secondary | ICD-10-CM | POA: Diagnosis not present

## 2024-10-21 DIAGNOSIS — G894 Chronic pain syndrome: Secondary | ICD-10-CM | POA: Diagnosis present

## 2024-10-21 DIAGNOSIS — I509 Heart failure, unspecified: Secondary | ICD-10-CM | POA: Diagnosis not present

## 2024-10-21 DIAGNOSIS — Z992 Dependence on renal dialysis: Secondary | ICD-10-CM | POA: Diagnosis not present

## 2024-10-21 DIAGNOSIS — N186 End stage renal disease: Secondary | ICD-10-CM | POA: Diagnosis present

## 2024-10-21 DIAGNOSIS — K219 Gastro-esophageal reflux disease without esophagitis: Secondary | ICD-10-CM | POA: Diagnosis present

## 2024-10-21 DIAGNOSIS — Z9981 Dependence on supplemental oxygen: Secondary | ICD-10-CM

## 2024-10-21 DIAGNOSIS — J9611 Chronic respiratory failure with hypoxia: Secondary | ICD-10-CM

## 2024-10-21 DIAGNOSIS — I272 Pulmonary hypertension, unspecified: Secondary | ICD-10-CM | POA: Diagnosis present

## 2024-10-21 DIAGNOSIS — E1122 Type 2 diabetes mellitus with diabetic chronic kidney disease: Secondary | ICD-10-CM | POA: Diagnosis present

## 2024-10-21 DIAGNOSIS — K5900 Constipation, unspecified: Secondary | ICD-10-CM | POA: Diagnosis present

## 2024-10-21 DIAGNOSIS — R7989 Other specified abnormal findings of blood chemistry: Secondary | ICD-10-CM | POA: Insufficient documentation

## 2024-10-21 DIAGNOSIS — Z1152 Encounter for screening for COVID-19: Secondary | ICD-10-CM | POA: Diagnosis not present

## 2024-10-21 DIAGNOSIS — I480 Paroxysmal atrial fibrillation: Secondary | ICD-10-CM | POA: Diagnosis present

## 2024-10-21 DIAGNOSIS — E8770 Fluid overload, unspecified: Secondary | ICD-10-CM | POA: Diagnosis present

## 2024-10-21 DIAGNOSIS — Z96652 Presence of left artificial knee joint: Secondary | ICD-10-CM | POA: Diagnosis present

## 2024-10-21 DIAGNOSIS — Z888 Allergy status to other drugs, medicaments and biological substances status: Secondary | ICD-10-CM | POA: Diagnosis not present

## 2024-10-21 DIAGNOSIS — D631 Anemia in chronic kidney disease: Secondary | ICD-10-CM | POA: Diagnosis present

## 2024-10-21 LAB — CBC
HCT: 23.6 % — ABNORMAL LOW (ref 36.0–46.0)
Hemoglobin: 7.4 g/dL — ABNORMAL LOW (ref 12.0–15.0)
MCH: 26.9 pg (ref 26.0–34.0)
MCHC: 31.4 g/dL (ref 30.0–36.0)
MCV: 85.8 fL (ref 80.0–100.0)
Platelets: 184 K/uL (ref 150–400)
RBC: 2.75 MIL/uL — ABNORMAL LOW (ref 3.87–5.11)
RDW: 19.1 % — ABNORMAL HIGH (ref 11.5–15.5)
WBC: 5.2 K/uL (ref 4.0–10.5)
nRBC: 0 % (ref 0.0–0.2)

## 2024-10-21 LAB — RENAL FUNCTION PANEL
Albumin: 3.5 g/dL (ref 3.5–5.0)
Anion gap: 12 (ref 5–15)
BUN: 39 mg/dL — ABNORMAL HIGH (ref 8–23)
CO2: 29 mmol/L (ref 22–32)
Calcium: 9 mg/dL (ref 8.9–10.3)
Chloride: 96 mmol/L — ABNORMAL LOW (ref 98–111)
Creatinine, Ser: 7.57 mg/dL — ABNORMAL HIGH (ref 0.44–1.00)
GFR, Estimated: 5 mL/min — ABNORMAL LOW
Glucose, Bld: 89 mg/dL (ref 70–99)
Phosphorus: 4.1 mg/dL (ref 2.5–4.6)
Potassium: 5.2 mmol/L — ABNORMAL HIGH (ref 3.5–5.1)
Sodium: 137 mmol/L (ref 135–145)

## 2024-10-21 LAB — TROPONIN T, HIGH SENSITIVITY: Troponin T High Sensitivity: 135 ng/L (ref 0–19)

## 2024-10-21 LAB — MAGNESIUM: Magnesium: 2.1 mg/dL (ref 1.7–2.4)

## 2024-10-21 LAB — GLUCOSE, CAPILLARY
Glucose-Capillary: 92 mg/dL (ref 70–99)
Glucose-Capillary: 83 mg/dL (ref 70–99)

## 2024-10-21 LAB — PREPARE RBC (CROSSMATCH)

## 2024-10-21 LAB — MRSA NEXT GEN BY PCR, NASAL: MRSA by PCR Next Gen: NOT DETECTED

## 2024-10-21 MED ORDER — INSULIN ASPART 100 UNIT/ML IJ SOLN
0.0000 [IU] | Freq: Three times a day (TID) | INTRAMUSCULAR | Status: DC
  Filled 2024-10-21: qty 1

## 2024-10-21 MED ORDER — LIDOCAINE-PRILOCAINE 2.5-2.5 % EX CREA: 1.0000 | TOPICAL_CREAM | CUTANEOUS | Status: DC | PRN

## 2024-10-21 MED ORDER — CARVEDILOL 3.125 MG PO TABS
3.1250 mg | ORAL_TABLET | Freq: Two times a day (BID) | ORAL | Status: DC
  Administered 2024-10-21 – 2024-10-23 (×4): 3.125 mg via ORAL
  Filled 2024-10-21 (×4): qty 1

## 2024-10-21 MED ORDER — SENNA 8.6 MG PO TABS
2.0000 | ORAL_TABLET | Freq: Two times a day (BID) | ORAL | Status: DC
  Administered 2024-10-22 – 2024-10-23 (×2): 17.2 mg via ORAL
  Filled 2024-10-21 (×4): qty 2

## 2024-10-21 MED ORDER — LINACLOTIDE 145 MCG PO CAPS: 145.0000 ug | ORAL_CAPSULE | Freq: Every day | ORAL | Status: DC

## 2024-10-21 MED ORDER — POVIDONE-IODINE 10 % EX SWAB: 2.0000 | Freq: Once | CUTANEOUS | Status: DC

## 2024-10-21 MED ORDER — BOOST PLUS PO LIQD
237.0000 mL | Freq: Every day | ORAL | Status: DC
  Administered 2024-10-21: 237 mL via ORAL
  Filled 2024-10-21 (×3): qty 237

## 2024-10-21 MED ORDER — PANTOPRAZOLE SODIUM 40 MG PO TBEC
40.0000 mg | DELAYED_RELEASE_TABLET | Freq: Two times a day (BID) | ORAL | Status: DC
  Administered 2024-10-21 – 2024-10-23 (×5): 40 mg via ORAL
  Filled 2024-10-21 (×5): qty 1

## 2024-10-21 MED ORDER — IOHEXOL 350 MG/ML SOLN
100.0000 mL | Freq: Once | INTRAVENOUS | Status: AC | PRN
  Administered 2024-10-21: 100 mL via INTRAVENOUS

## 2024-10-21 MED ORDER — LIDOCAINE VISCOUS HCL 2 % MT SOLN
15.0000 mL | Freq: Once | OROMUCOSAL | Status: AC
  Administered 2024-10-21: 15 mL via ORAL
  Filled 2024-10-21: qty 15

## 2024-10-21 MED ORDER — LINACLOTIDE 145 MCG PO CAPS
145.0000 ug | ORAL_CAPSULE | Freq: Every day | ORAL | Status: DC
  Administered 2024-10-21 – 2024-10-22 (×2): 145 ug via ORAL
  Filled 2024-10-21 (×2): qty 1

## 2024-10-21 MED ORDER — PENTAFLUOROPROP-TETRAFLUOROETH EX AERO: 1.0000 | INHALATION_SPRAY | CUTANEOUS | Status: DC | PRN

## 2024-10-21 MED ORDER — SODIUM ZIRCONIUM CYCLOSILICATE 10 G PO PACK
10.0000 g | PACK | Freq: Once | ORAL | Status: AC
  Administered 2024-10-21: 10 g via ORAL
  Filled 2024-10-21: qty 1

## 2024-10-21 MED ORDER — NYSTATIN 100000 UNIT/GM EX POWD
Freq: Two times a day (BID) | CUTANEOUS | Status: DC
  Filled 2024-10-21 (×2): qty 15

## 2024-10-21 MED ORDER — ALUM & MAG HYDROXIDE-SIMETH 200-200-20 MG/5ML PO SUSP
30.0000 mL | Freq: Once | ORAL | Status: AC
  Administered 2024-10-21: 30 mL via ORAL
  Filled 2024-10-21: qty 30

## 2024-10-21 MED ORDER — TRANEXAMIC ACID-NACL 1000-0.7 MG/100ML-% IV SOLN
1000.0000 mg | INTRAVENOUS | Status: DC
  Filled 2024-10-21: qty 100

## 2024-10-21 MED ORDER — SODIUM CHLORIDE 0.9% IV SOLUTION: Freq: Once | INTRAVENOUS | Status: AC

## 2024-10-21 MED ORDER — BOOST GLUCOSE CONTROL PO LIQD: 237.0000 mL | Freq: Every day | ORAL | Status: DC

## 2024-10-21 MED ORDER — CEFAZOLIN SODIUM-DEXTROSE 2-4 GM/100ML-% IV SOLN: 2.0000 g | INTRAVENOUS | Status: DC

## 2024-10-21 MED ORDER — LACTATED RINGERS IV SOLN: INTRAVENOUS | Status: DC

## 2024-10-21 MED ORDER — IPRATROPIUM-ALBUTEROL 0.5-2.5 (3) MG/3ML IN SOLN: 3.0000 mL | RESPIRATORY_TRACT | Status: DC | PRN

## 2024-10-21 MED ORDER — ACETAMINOPHEN 500 MG PO TABS
1000.0000 mg | ORAL_TABLET | Freq: Once | ORAL | Status: DC
  Filled 2024-10-21: qty 2

## 2024-10-21 NOTE — Progress Notes (Cosign Needed Addendum)
 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   Overnight Events: admitted overnight   Interim History: She feels not the best this morning due to her breathing. She was dismayed when a nurse told her earlier that her oxygen  was going low, right now she is on 3.5L [place]. She does not feel that her breathing has been normal over the last few days, noting increased weakness and dyspnea with light activity and increasing her oxygen  did not help. Overall, she feels about the same as she did yesterday. She has no CP, has some tightness with breathing, and has intermittent sharp epigastric pain but was able to get rest. She had multiple thin and soft bowel movements that are dark to black, although she does not describe it as true diarrhea. She confirms that she takes her medicines from pre-made packs and with the help of a home nurse, and that includes an rx for po iron . She has not tried any particular home remedies to soothe her abdominal pain. She thinks that her sevelamer /Renvela  medicine causes recurrent stomach upset and she is concerned that her medication changes over recurrent hospitalizations are causing side effects, including stomach inflammation.  OBJECTIVE:  Vital Signs: Vitals:   10/21/24 1026 10/21/24 1130 10/21/24 1309 10/21/24 1524  BP: (!) 152/81 (!) 143/73 (!) 150/82 (!) 152/83  Pulse: 73 69 65 64  Resp: 15 (!) 27 18 17   Temp: 98 F (36.7 C)  97.7 F (36.5 C) 98.1 F (36.7 C)  TempSrc: Oral   Oral  SpO2: 100% 100% 100% 100%  Weight:      Height:        Supplemental O2: 3 [place] SpO2: 100 % O2 Flow Rate (L/min): 3 L/min  Filed Weights   10/20/24 1326  Weight: 75 kg     Intake/Output Summary (Last 24 hours) at 10/21/2024 1559 Last data filed at 10/21/2024 1423 Gross per 24 hour  Intake 533.33 ml  Output --  Net 533.33 ml   Net IO Since Admission: 533.33 mL [10/21/24 1559]  Physical Exam: Physical Exam Constitutional:      General: She is not in acute distress.    Appearance: She is not ill-appearing or toxic-appearing.     Comments: Tearful  Cardiovascular:     Rate and Rhythm: Normal rate and regular rhythm.     Heart sounds: No murmur heard.    No friction rub. No gallop.  Pulmonary:     Effort: Pulmonary effort is normal. No accessory muscle usage or respiratory distress.     Breath sounds: Examination of the right-lower field reveals rales. Examination of the left-lower field reveals rales. Rales present. No decreased breath sounds, wheezing or rhonchi.  Abdominal:     General: Bowel sounds are normal.     Palpations: Abdomen is soft.     Tenderness: There is generalized abdominal tenderness and tenderness in the epigastric area. There is no guarding.  Musculoskeletal:     Right lower leg: No tenderness. No edema.     Left lower leg: No tenderness. No edema.  Skin:  General: Skin is warm and dry.  Neurological:     Mental Status: She is alert.     Patient Lines/Drains/Airways Status     Active Line/Drains/Airways     Name Placement date Placement time Site Days   Peripheral IV 10/20/24 22 G 1.75 Anterior;Right Forearm 10/20/24  1437  Forearm  1   Fistula / Graft Left Upper arm Arteriovenous fistula 08/11/21  0836  Upper arm  1167            Pertinent labs and imaging:      Latest Ref Rng & Units 10/21/2024   12:19 AM 10/20/2024    2:44 PM 10/08/2024    7:57 PM  CBC  WBC 4.0 - 10.5 K/uL 5.2  5.3  5.6   Hemoglobin 12.0 - 15.0 g/dL 7.4  8.4  8.6   Hematocrit 36.0 - 46.0 % 23.6  26.6  27.0    Platelets 150 - 400 K/uL 184  182  200        Latest Ref Rng & Units 10/21/2024   12:19 AM 10/20/2024    2:44 PM 10/08/2024    7:57 PM  CMP  Glucose 70 - 99 mg/dL 89  78  80   BUN 8 - 23 mg/dL 39  34  56   Creatinine 0.44 - 1.00 mg/dL 2.42  3.70  1.18   Sodium 135 - 145 mmol/L 137  137  143   Potassium 3.5 - 5.1 mmol/L 5.2  4.5  4.3   Chloride 98 - 111 mmol/L 96  96  101   CO2 22 - 32 mmol/L 29  29  26    Calcium  8.9 - 10.3 mg/dL 9.0  9.8  9.6   Total Protein 6.5 - 8.1 g/dL  7.7  7.5   Total Bilirubin 0.0 - 1.2 mg/dL  0.4  0.5   Alkaline Phos 38 - 126 U/L  163  173   AST 15 - 41 U/L  29  25   ALT 0 - 44 U/L  27  28     US  Abdomen Limited RUQ (LIVER/GB) Result Date: 10/21/2024 EXAM: Right Upper Quadrant Abdominal Ultrasound 10/21/2024 02:08:34 AM TECHNIQUE: Real-time ultrasonography of the right upper quadrant of the abdomen was performed. COMPARISON: None available. CLINICAL HISTORY: RUQ pain. FINDINGS: LIVER: Normal echogenicity. No intrahepatic biliary ductal dilatation. No evidence of mass. Hepatopetal flow in the portal vein. BILIARY SYSTEM: No pericholecystic fluid or wall thickening. Small amount of sludge suspected within the gallbladder, but no shadowing stones are identified. Common bile duct is within normal limits measuring 5 mm. OTHER: No right upper quadrant ascites. A small amount of right pleural fluid is seen. IMPRESSION: 1. Small amount of sludge suspected within the gallbladder, no shadowing stones identified. 2. Small amount of right pleural fluid. Electronically signed by: Franky Stanford MD 10/21/2024 02:40 AM EST RP Workstation: [id number]   CT ABDOMEN PELVIS W CONTRAST Result Date: 10/20/2024 EXAM: CT ABDOMEN AND PELVIS WITH CONTRAST 10/20/2024 08:50:00 PM TECHNIQUE: CT of the abdomen and pelvis was performed with the administration of 75 mL of iohexol  (OMNIPAQUE ) 350 MG/ML injection. Multiplanar reformatted images are provided for review. Automated exposure control,  iterative reconstruction, and/or weight-based adjustment of the mA/kV was utilized to reduce the radiation dose to as low as reasonably achievable. COMPARISON: 06/09/2024. CLINICAL HISTORY: acute abdominal pain. FINDINGS: LOWER CHEST: Cardiomegaly. Small right pleural effusion. Bibasilar atelectasis. LIVER: The liver is unremarkable. GALLBLADDER AND BILE DUCTS: Tiny layering gallstones within the gallbladder. No biliary  ductal dilatation. SPLEEN: Cysts or hemangiomas in the spleen. PANCREAS: No acute abnormality. ADRENAL GLANDS: No acute abnormality. KIDNEYS, URETERS AND BLADDER: Kidneys are atrophic. Hypodense area in the lower pole of the left kidney measures up to 2.2 cm. This is stable since imaging back to 09/29/2023 and its most compatible with cyst. No stones in the kidneys or ureters. No hydronephrosis. No perinephric or periureteral stranding. Urinary bladder is unremarkable. GI AND BOWEL: Stomach demonstrates no acute abnormality. Normal appendix. Sigmoid diverticulosis. There is no bowel obstruction. PERITONEUM AND RETROPERITONEUM: No ascites. No free air. VASCULATURE: Aorta is normal in caliber. Aortic atherosclerosis. LYMPH NODES: No lymphadenopathy. REPRODUCTIVE ORGANS: No acute abnormality. 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 No acute osseous abnormality. No focal soft tissue abnormality. IMPRESSION: 1. No acute findings in the abdomen or pelvis. 2. Cholelithiasis. 3. Cardiomegaly, small right pleural effusion. 4. Sigmoid diverticulosis. Electronically signed by: Franky Crease MD 10/20/2024 09:40 PM EST RP Workstation: [id number]    ASSESSMENT/PLAN:  Assessment: Principal Problem:   Hypervolemia Active Problems:   Hypertension   GERD (gastroesophageal reflux disease)   Atrial fibrillation (HCC)   Anemia of chronic disease   Chronic pain syndrome   Heart failure with recovered ejection fraction (HFrecEF) (HCC)    ESRD on dialysis (HCC)   Prolonged QT syndrome   Chronic hypoxic respiratory failure, on home oxygen  therapy (HCC)   Asymptomatic bacteriuria   Elevated troponin   Plan: #Abdominal Pain  GERD Constipation  Pain description on admission: upper stomach under breast that wraps around left side of back. Initial workup negative for pancreatitis and CT on admission did show cholelithiasis (RUQ US  negative) and sigmoid diverticulosis with increased stool burden. Has presented to ED for abdominal pain related to hypervolemia and HD in the past. She describes this abdominal pain as different from previous - sharper and more episodic. No meal time association. States bowels have been normal but to attending physician stated that bowels have been more liquid/jelly-like and dark after running out of senna at home. With history of severe abdominal pain in setting of possible hypotension with HD, there is concern for bowel hypoperfusion/ischemic bowel, however patient is still complaining of pain during this hospitalization with Bps elevated. Given recurrent episodes of abdominal pain, pain out of proportion of physical exam, and unremarkable CT, will obtain further imaging. Additional differential at this time includes constipation in setting of chronic opioid use and running out of senna vs PUD vs esophagitis.   - f/u CT angio abdomen  - Bowel regimen:   - linzess  145 mcg once daily   - senna 17.2 mg (2 tablets) BID  - miralax  17 g oral daily   - protonix  40 mg BID  - gave Maalox/mylanta and lidocaine  viscous mouth solution without much improvement per patient.  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 Nephrology recently d/c losartan  at last hospitalization. Home O2 generally 3 L. Received 44 minutes of treatment 1/18 in HD prior to admission. Overall, patient did not appear  largely volume overloaded on physical exam, appreciated slight crackles to lower lung bases, no LE edema. Chest xray did show cardiomegaly with vascular congestion and trace bilateral pleural effusions. Received 1 unit PRBC transfusion with hgb <8 and cardiac hx. Further chart review shows that dry weight is around 140 pounds in September and last seen in clinic recently was 165 pounds which is where she is today. She reports going to HD in the interim. Will benefit from HD tonight. Nephrology consulted, appreciate recs (see below) - daily In/outs - daily weights  - continue carvedilol  3.125 mg BID with meals : Hold prior to HD - continue hydralazine  75 mg TID : Hold prior to HD - continue Isordil  20 mg TID : Hold prior to HD - monitor Hgb, transfuse if <8 and not fluid overloaded - continue Renvela  800 mg TID with meals  - daily RFPs - hold iron  and hectorol  3 mcg  - DuoNeb 3 mL q 4 hours PRN ordered   Melena  Hx GAVE Patient reports several darks stools. Takes oral iron  supplementation at home. Fecal occult blood testing negative.   #Afib Hx DVT  Qtc prolongation  NSR, Qtc 512 ms. Managed with amiodarone  200 mg daily. Not on AC due to GAVE.  -Continue home amiodarone  200 mg daily  -Avoid Qtc prolonging meds -SCDs -cardiac telemetry   #Chronic Pain Syndrome  Follows with Dr. Fidel, has hip arthoplasty scheduled for 1/29. Managed with hydromorphone  2 mg Q 12 Hrs at home.  - Tylenol  500 mg q 6 hours PRN  - dilaudid  2 mg q 12 hours  - dilaudid  0.5 mg IV q 4 hours PRN  - continue lidocaine  patch PRN   Hyperlipidemia  - continue atorvostatin 40 mg daily   Bacteruria  Patients UA cloudy, pH9, hgb small, with 100 protien, trace leukocytes, and many bacteria. No specific urinary  complaints.   T2DM Nutrition  Last A1c 2022 5.6 - monitor glucose - very sensitive sliding scale ordered - A1C ordered  - continue multivitamin  - continue Boost Plus   Best Practice: Diet: Renal diet VTE: TED hose to OR Start: 10/21/24 1405 Place and maintain sequential compression device Start: 10/21/24 0112 SCDs Start: 10/20/24 2008 Code: Full  Disposition planning: Therapy Recs: Home Health, DME: none DISPO: Anticipated discharge pending clinical improvement Signature:  Verlean [facility]  3:59 PM, 10/21/2024  On Call pager [phone]  "

## 2024-10-21 NOTE — Evaluation (Signed)
 Physical Therapy Evaluation Patient Details Name: Audrey Hill MRN: [id number] DOB: Mar 10, 1956 Today's Date: 10/21/2024  History of Present Illness  69 y.o. F presented to [facility] ED 10/20/24 from  dialysis with acute onset of epigastric pain and worsening dyspnea. Pt recently admitted to [facility] with volume overload. She arrived 6.2 kg above EDW and was hypotensive on 3L O2 via [place]. CXR with cardiomegaly and pulmonary vascular congestion with bilateral interstitial prominences suggestive of edema. PMHx: HTN, DM2, CVA, CHF, ESRD on HD (TTS), depression, atrial fibrillation, chronic pain and malnutrition, seizures, GERD, hx of DVT, hx of abdominal ascites.   Clinical Impression  Pt admitted with above diagnosis. PTA, pt was modI for functional mobility using rollator and modI for ADLs. She lives with her grandson in an apartment with 1 STE. Pt currently with functional limitations due to the deficits listed below (see PT Problem List). She performed bed mobility with modI and required supervision for transfers and gait using rollator. Weaned pt to RA with SpO2 89-93% with activity. Cued PLB technique. Pt will benefit from acute skilled PT to increase her independence and safety with mobility to allow discharge. Recommend HHPT to increase strength, improve balance, decrease fall risk, advance activity tolerance, and optimize safety within the home environment.     10/21/24 0900  Vitals  Patient Position (if appropriate) Orthostatic Vitals  Orthostatic Lying   BP- Lying 165/82  Pulse- Lying 73  Orthostatic Sitting  BP- Sitting 159/77  Pulse- Sitting 78  Orthostatic Standing at 0 minutes  BP- Standing at 0 minutes 172/76  Pulse- Standing at 0 minutes 79  Orthostatic Standing at 3 minutes  BP- Standing at 3 minutes 161/82  Pulse- Standing at 3 minutes 82        If plan is discharge home, recommend the following: A little help with walking and/or transfers;A little help with  bathing/dressing/bathroom;Assistance with cooking/housework;Assist for transportation;Help with stairs or ramp for entrance   Can travel by private vehicle        Equipment Recommendations None recommended by PT (Pt already has needed DME)  Recommendations for Other Services       Functional Status Assessment Patient has had a recent decline in their functional status and demonstrates the ability to make significant improvements in function in a reasonable and predictable amount of time.     Precautions / Restrictions Precautions Precautions: Fall Recall of Precautions/Restrictions: Intact Restrictions Weight Bearing Restrictions Per Provider Order: No      Mobility  Bed Mobility Overal bed mobility: Modified Independent             General bed mobility comments: Head of stretcher slightly elevated.    Transfers Overall transfer level: Needs assistance Equipment used: Rollator (4 wheels) Transfers: Sit to/from Stand, Bed to chair/wheelchair/BSC Sit to Stand: Supervision   Step pivot transfers: Supervision       General transfer comment: Pt stood from stretcher. She locked rollator breaks and demonstrated proper hand placement to power up. No physical assist. Sup for safety. Transferred to [facility].    Ambulation/Gait Ambulation/Gait assistance: Supervision Gait Distance (Feet): 150 Feet Assistive device: Rollator (4 wheels) Gait Pattern/deviations: Step-through pattern, Decreased step length - right, Decreased step length - left, Decreased stride length, Trunk flexed Gait velocity: decr Gait velocity interpretation: <1.31 ft/sec, indicative of household ambulator   General Gait Details: Pt ambulated with short slow steps. She maintained a fwd lean. Cues for upright posture. Pt navigated room/hallway well, no LOB. Cued PLB technique  throughout.  Stairs            Wheelchair Mobility     Tilt Bed    Modified Rankin (Stroke Patients Only)        Balance Overall balance assessment: Mild deficits observed, not formally tested                                           Pertinent Vitals/Pain Pain Assessment Pain Assessment: 0-10 Pain Score: 8  Pain Location: Stomach, BLE, and posterior neck Pain Descriptors / Indicators: Grimacing, Moaning, Discomfort, Aching Pain Intervention(s): Monitored during session, Limited activity within patient's tolerance, Repositioned    Home Living Family/patient expects to be discharged to:: Private residence Living Arrangements: Other relatives Technical Sales Engineer) Available Help at Discharge: Family;Available PRN/intermittently Type of Home: Apartment Home Access: Stairs to enter;Ramped entrance   Entrance Stairs-Number of Steps: 1   Home Layout: One level Home Equipment: Agricultural Consultant (2 wheels);Cane - single point;Rollator (4 wheels)      Prior Function Prior Level of Function : Independent/Modified Independent;Driving;Needs assist             Mobility Comments: Ambulates using rollator. Denies fall hx. Reports short-distance activity, because she gets worn out easily. ADLs Comments: ModI with ADLs. Pt reports she sometimes needs help with bathing. Fatigue with cooking. Family assist with shopping. Manages her own medications. Drives.     Extremity/Trunk Assessment   Upper Extremity Assessment Upper Extremity Assessment: Defer to OT evaluation    Lower Extremity Assessment Lower Extremity Assessment: Generalized weakness    Cervical / Trunk Assessment Cervical / Trunk Assessment: Normal  Communication   Communication Communication: No apparent difficulties    Cognition Arousal: Alert Behavior During Therapy: WFL for tasks assessed/performed   PT - Cognitive impairments: No apparent impairments, No family/caregiver present to determine baseline                       PT - Cognition Comments: Pt A,Ox4 Following commands: Intact       Cueing  Cueing Techniques: Verbal cues     General Comments General comments (skin integrity, edema, etc.): Pt greeted on 3L O2 via [place]. Weaned to RA. At rest, 95-98% during activity 89-93%. Cued PLB technique. Orthostatics vitals taken, (-). Pt reported lighteheadedness during gait.    Exercises     Assessment/Plan    PT Assessment Patient needs continued PT services  PT Problem List Decreased strength;Decreased activity tolerance;Decreased balance;Decreased mobility       PT Treatment Interventions DME instruction;Gait training;Stair training;Functional mobility training;Therapeutic activities;Therapeutic exercise;Balance training;Patient/family education    PT Goals (Current goals can be found in the Care Plan section)  Acute Rehab PT Goals Patient Stated Goal: Regain independence PT Goal Formulation: With patient Time For Goal Achievement: 11/04/24 Potential to Achieve Goals: Good    Frequency Min 2X/week     Co-evaluation               AM-PAC PT 6 Clicks Mobility  Outcome Measure Help needed turning from your back to your side while in a flat bed without using bedrails?: None Help needed moving from lying on your back to sitting on the side of a flat bed without using bedrails?: None Help needed moving to and from a bed to a chair (including a wheelchair)?: A Little Help needed standing up from a chair using your arms (  e.g., wheelchair or bedside chair)?: A Little Help needed to walk in hospital room?: A Little Help needed climbing 3-5 steps with a railing? : A Little 6 Click Score: 20    End of Session Equipment Utilized During Treatment: Gait belt Activity Tolerance: Patient tolerated treatment well Patient left: Other (comment);with call bell/phone within reach (on [facility]) Nurse Communication: Mobility status;Other (comment) (pt positioning on BSC) PT Visit Diagnosis: Muscle weakness (generalized) (M62.81);Other abnormalities of gait and mobility (R26.89)    Time:  9140-9082 PT Time Calculation (min) (ACUTE ONLY): 18 min   Charges:   PT Evaluation $PT Eval Moderate Complexity: 1 Mod   PT General Charges $$ ACUTE PT VISIT: 1 Visit         Randall SAUNDERS, PT, DPT Acute Rehabilitation Services Office: [phone] Secure Chat Preferred  Audrey Hill 10/21/2024, 12:40 PM

## 2024-10-21 NOTE — Progress Notes (Signed)
 " [facility] Progress Note   Subjective:   Pt seen in room, eating breakfast. Wearing O2 and denies any shortness of breath at this time. Says chest feels congested when she takes a deep breath. Denies HA, dizziness, nausea. Anxious about her elevated blood pressure.   Objective Vitals:   10/21/24 0300 10/21/24 0600 10/21/24 0705 10/21/24 0715  BP: (!) 128/59  (!) 160/69   Pulse: 67 69 77   Resp: (!) 22 (!) 23 20   Temp:    97.6 F (36.4 C)  TempSrc:    Temporal  SpO2: 100% 100% 100%   Weight:      Height:       Physical Exam General: Alert female in NAD Heart: RRR, no murmurs Lungs: CTA bilaterally, respirations unlabored Abdomen: Soft, non-distended, +BS Extremities: no edema b/l lower extremities Dialysis Access: LUE AVF + t/b  Additional Objective Labs: Basic Metabolic Panel: Recent Labs  Lab 10/20/24 1444 10/21/24 0019  NA 137 137  K 4.5 5.2*  CL 96* 96*  CO2 29 29  GLUCOSE 78 89  BUN 34* 39*  CREATININE 6.29* 7.57*  CALCIUM  9.8 9.0  PHOS  --  4.1   Liver Function Tests: Recent Labs  Lab 10/20/24 1444 10/21/24 0019  AST 29  --   ALT 27  --   ALKPHOS 163*  --   BILITOT 0.4  --   PROT 7.7  --   ALBUMIN 4.0 3.5   Recent Labs  Lab 10/20/24 1444  LIPASE 32   CBC: Recent Labs  Lab 10/20/24 1444 10/21/24 0019  WBC 5.3 5.2  HGB 8.4* 7.4*  HCT 26.6* 23.6*  MCV 84.7 85.8  PLT 182 184   Blood Culture    Component Value Date/Time   SDES BLOOD RIGHT FOREARM 12/02/2022 1710   SPECREQUEST  12/02/2022 1710    BOTTLES DRAWN AEROBIC AND ANAEROBIC Blood Culture results may not be optimal due to an inadequate volume of blood received in culture bottles   CULT  12/02/2022 1710    NO GROWTH 5 DAYS Performed at [facility], [address], [place], [place] [address]    REPTSTATUS 12/07/2022 FINAL 12/02/2022 1710    Cardiac Enzymes: No results for input(s): CKTOTAL, CKMB, CKMBINDEX, TROPONINI in the last 168  hours. CBG: No results for input(s): GLUCAP in the last 168 hours. Iron  Studies: No results for input(s): IRON , TIBC, TRANSFERRIN, FERRITIN in the last 72 hours. @lablastinr3 @ Studies/Results: US  Abdomen Limited RUQ (LIVER/GB) Result Date: 10/21/2024 EXAM: Right Upper Quadrant Abdominal Ultrasound 10/21/2024 02:08:34 AM TECHNIQUE: Real-time ultrasonography of the right upper quadrant of the abdomen was performed. COMPARISON: None available. CLINICAL HISTORY: RUQ pain. FINDINGS: LIVER: Normal echogenicity. No intrahepatic biliary ductal dilatation. No evidence of mass. Hepatopetal flow in the portal vein. BILIARY SYSTEM: No pericholecystic fluid or wall thickening. Small amount of sludge suspected within the gallbladder, but no shadowing stones are identified. Common bile duct is within normal limits measuring 5 mm. OTHER: No right upper quadrant ascites. A small amount of right pleural fluid is seen. IMPRESSION: 1. Small amount of sludge suspected within the gallbladder, no shadowing stones identified. 2. Small amount of right pleural fluid. Electronically signed by: Franky Stanford MD 10/21/2024 02:40 AM EST RP Workstation: [id number]   CT ABDOMEN PELVIS W CONTRAST Result Date: 10/20/2024 EXAM: CT ABDOMEN AND PELVIS WITH CONTRAST 10/20/2024 08:50:00 PM TECHNIQUE: CT of the abdomen and pelvis was performed with the administration of 75 mL of iohexol  (OMNIPAQUE ) 350  MG/ML injection. Multiplanar reformatted images are provided for review. Automated exposure control, iterative reconstruction, and/or weight-based adjustment of the mA/kV was utilized to reduce the radiation dose to as low as reasonably achievable. COMPARISON: 06/09/2024. CLINICAL HISTORY: acute abdominal pain. FINDINGS: LOWER CHEST: Cardiomegaly. Small right pleural effusion. Bibasilar atelectasis. LIVER: The liver is unremarkable. GALLBLADDER AND BILE DUCTS: Tiny layering gallstones within the gallbladder. No biliary ductal dilatation.  SPLEEN: Cysts or hemangiomas in the spleen. PANCREAS: No acute abnormality. ADRENAL GLANDS: No acute abnormality. KIDNEYS, URETERS AND BLADDER: Kidneys are atrophic. Hypodense area in the lower pole of the left kidney measures up to 2.2 cm. This is stable since imaging back to 09/29/2023 and its most compatible with cyst. No stones in the kidneys or ureters. No hydronephrosis. No perinephric or periureteral stranding. Urinary bladder is unremarkable. GI AND BOWEL: Stomach demonstrates no acute abnormality. Normal appendix. Sigmoid diverticulosis. There is no bowel obstruction. PERITONEUM AND RETROPERITONEUM: No ascites. No free air. VASCULATURE: Aorta is normal in caliber. Aortic atherosclerosis. LYMPH NODES: No lymphadenopathy. REPRODUCTIVE ORGANS: No acute abnormality. 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 No acute osseous abnormality. No focal soft tissue abnormality. IMPRESSION: 1. No acute findings in the abdomen or pelvis. 2. Cholelithiasis. 3. Cardiomegaly, small right pleural effusion. 4. Sigmoid diverticulosis. Electronically signed by: Franky Crease MD 10/20/2024 09:40 PM EST RP Workstation: [id number]   DG Chest Port 1 View Result Date: 10/20/2024 CLINICAL DATA:  Shortness of breath. EXAM: PORTABLE CHEST 1 VIEW COMPARISON:  10/08/2024. FINDINGS: The heart is enlarged and the mediastinal contour is within normal limits. The pulmonary vasculature is distended. Interstitial prominence is present bilaterally with mild airspace disease at the lung bases. Possible trace bilateral pleural effusions. No pneumothorax is seen. Stable radiopaque densities are present over the left upper chest. No acute osseous abnormality. IMPRESSION: 1. Cardiomegaly with pulmonary vascular congestion. 2. Interstitial prominence bilaterally with mild airspace disease at the lung bases, possible edema or infiltrate. 3. Possible trace bilateral pleural  effusions. Electronically Signed   By: Leita Birmingham M.D.   On: 10/20/2024 15:49   Medications:   sodium chloride    Intravenous Once   amiodarone   200 mg Oral Daily   atorvastatin   40 mg Oral Daily   Boost Glucose Control  237 mL Oral Daily   carvedilol   3.125 mg Oral BID WC   diclofenac  Sodium  4 g Topical QID   hydrALAZINE   75 mg Oral TID   HYDROmorphone   2 mg Oral Q12H   isosorbide  dinitrate  20 mg Oral TID   multivitamin  1 tablet Oral Daily   pantoprazole   40 mg Oral BID   polyethylene glycol  17 g Oral Daily   senna  2 tablet Oral Daily   sevelamer  carbonate  800 mg Oral TID WC    Dialysis Orders: TTS at [facility] using left upper arm AV fistula. 2K, 2 calcium  bath. EDW 69.5 kg. No heparin . Last Mircera 10/16/2024, 150 mcg. Last TSAT 34%.   Assessment/Plan: 33F ESRD THS LUE AVF related to volume overload and abdominal pain which is recurrent.   ESRD THS using LUE AVF: Planning for extra HD today for volume removal. Reinforced fluid restrictions. She says BP has been dropping on dialysis sometimes but is generally elevated. Will follow trend and adjust meds PRN. Reminded to hold her antihypertensive medications prior to dialysis.  Volume overload: Chronic problem.  As above. Anemia: Hemoglobin 7.4, FOBT negative. not due for ESA, no  indication for iron .  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  Resume outpatient Hectorol  3 mcg if prolonged hospitalization. Abdominal/epigastric pain: Workup thus far unrevealing.  Per primary.  May be related to volume overload.   Plan HD ordered Daily weights, Daily Renal Panel, Strict I/Os, Avoid nephrotoxins (NSAIDs, judicious IV Contrast) Will continue to follow    Lucie Collet, PA-C 10/21/2024, 9:08 AM  [facility] Pager: [phone]   "

## 2024-10-21 NOTE — Plan of Care (Signed)

## 2024-10-21 NOTE — ED Notes (Signed)
 Patient transported to Ultrasound

## 2024-10-21 NOTE — ED Notes (Signed)
Pt given sandwich bag and ginger ale per request. 

## 2024-10-21 NOTE — Progress Notes (Signed)
 OT Cancellation Note  Patient Details Name: NYOKA ALCOSER MRN: [id number] DOB: 1956-04-15   Cancelled Treatment:    Reason Eval/Treat Not Completed: OT screened, no needs identified, will sign off (Pt was able to indep don socks and get to [facility], no acute OT needs identified. Thank you.)  Soma Bachand D Causey 10/21/2024, 9:25 AM

## 2024-10-21 NOTE — Care Management Obs Status (Signed)
 MEDICARE OBSERVATION STATUS NOTIFICATION   Patient Details  Name: ELENOR WILDES MRN: [id number] Date of Birth: [birth year]   Medicare Observation Status Notification Given:  Yes    Marval Gell, RN 10/21/2024, 3:08 PM

## 2024-10-21 NOTE — ED Notes (Signed)
 Pt given some coffee, per request.

## 2024-10-21 NOTE — Progress Notes (Signed)
 RT brought flutter valve to the bedside. MD is currently at the bedside with the patient. RT asked RN to assist with showing the patient how to use the flutter valve when the patient was available.

## 2024-10-22 ENCOUNTER — Ambulatory Visit: Payer: Self-pay | Admitting: Student

## 2024-10-22 DIAGNOSIS — N186 End stage renal disease: Secondary | ICD-10-CM

## 2024-10-22 DIAGNOSIS — I4891 Unspecified atrial fibrillation: Secondary | ICD-10-CM | POA: Diagnosis not present

## 2024-10-22 DIAGNOSIS — I132 Hypertensive heart and chronic kidney disease with heart failure and with stage 5 chronic kidney disease, or end stage renal disease: Secondary | ICD-10-CM

## 2024-10-22 DIAGNOSIS — D631 Anemia in chronic kidney disease: Secondary | ICD-10-CM

## 2024-10-22 DIAGNOSIS — E1122 Type 2 diabetes mellitus with diabetic chronic kidney disease: Secondary | ICD-10-CM

## 2024-10-22 DIAGNOSIS — K219 Gastro-esophageal reflux disease without esophagitis: Secondary | ICD-10-CM | POA: Diagnosis not present

## 2024-10-22 DIAGNOSIS — K5900 Constipation, unspecified: Secondary | ICD-10-CM

## 2024-10-22 DIAGNOSIS — J9611 Chronic respiratory failure with hypoxia: Secondary | ICD-10-CM

## 2024-10-22 DIAGNOSIS — E8770 Fluid overload, unspecified: Secondary | ICD-10-CM | POA: Diagnosis not present

## 2024-10-22 DIAGNOSIS — F329 Major depressive disorder, single episode, unspecified: Secondary | ICD-10-CM | POA: Diagnosis not present

## 2024-10-22 DIAGNOSIS — I509 Heart failure, unspecified: Secondary | ICD-10-CM

## 2024-10-22 DIAGNOSIS — R1013 Epigastric pain: Secondary | ICD-10-CM | POA: Diagnosis not present

## 2024-10-22 LAB — TYPE AND SCREEN
ABO/RH(D): O POS
Antibody Screen: NEGATIVE
Unit division: 0

## 2024-10-22 LAB — HEMOGLOBIN A1C
Hgb A1c MFr Bld: 5.1 % (ref 4.8–5.6)
Mean Plasma Glucose: 99.67 mg/dL

## 2024-10-22 LAB — CBC
HCT: 25.5 % — ABNORMAL LOW (ref 36.0–46.0)
Hemoglobin: 8.2 g/dL — ABNORMAL LOW (ref 12.0–15.0)
MCH: 27.2 pg (ref 26.0–34.0)
MCHC: 32.2 g/dL (ref 30.0–36.0)
MCV: 84.7 fL (ref 80.0–100.0)
Platelets: 165 K/uL (ref 150–400)
RBC: 3.01 MIL/uL — ABNORMAL LOW (ref 3.87–5.11)
RDW: 19 % — ABNORMAL HIGH (ref 11.5–15.5)
WBC: 4.2 K/uL (ref 4.0–10.5)
nRBC: 0 % (ref 0.0–0.2)

## 2024-10-22 LAB — BPAM RBC
Blood Product Expiration Date: 202602092359
ISSUE DATE / TIME: 202601180940
Unit Type and Rh: 5100

## 2024-10-22 LAB — RENAL FUNCTION PANEL
Albumin: 3.6 g/dL (ref 3.5–5.0)
Anion gap: 9 (ref 5–15)
BUN: 27 mg/dL — ABNORMAL HIGH (ref 8–23)
CO2: 30 mmol/L (ref 22–32)
Calcium: 8.8 mg/dL — ABNORMAL LOW (ref 8.9–10.3)
Chloride: 93 mmol/L — ABNORMAL LOW (ref 98–111)
Creatinine, Ser: 5.28 mg/dL — ABNORMAL HIGH (ref 0.44–1.00)
GFR, Estimated: 8 mL/min — ABNORMAL LOW
Glucose, Bld: 141 mg/dL — ABNORMAL HIGH (ref 70–99)
Phosphorus: 2.6 mg/dL (ref 2.5–4.6)
Potassium: 3.9 mmol/L (ref 3.5–5.1)
Sodium: 132 mmol/L — ABNORMAL LOW (ref 135–145)

## 2024-10-22 LAB — HEPATITIS B SURFACE ANTIBODY, QUANTITATIVE: Hep B S AB Quant (Post): 210 m[IU]/mL

## 2024-10-22 LAB — GLUCOSE, CAPILLARY: Glucose-Capillary: 121 mg/dL — ABNORMAL HIGH (ref 70–99)

## 2024-10-22 MED ORDER — HYDROMORPHONE HCL 1 MG/ML IJ SOLN
0.5000 mg | Freq: Once | INTRAMUSCULAR | Status: AC
  Administered 2024-10-22: 0.5 mg via INTRAVENOUS
  Filled 2024-10-22: qty 0.5

## 2024-10-22 MED ORDER — HYDROMORPHONE HCL 1 MG/ML IJ SOLN
INTRAMUSCULAR | Status: AC
  Filled 2024-10-22: qty 0.5

## 2024-10-22 MED ORDER — ACETAMINOPHEN 500 MG PO TABS
1000.0000 mg | ORAL_TABLET | Freq: Four times a day (QID) | ORAL | Status: DC | PRN
  Filled 2024-10-22: qty 2

## 2024-10-22 MED ORDER — LIDOCAINE VISCOUS HCL 2 % MT SOLN: 15.0000 mL | OROMUCOSAL | Status: DC | PRN

## 2024-10-22 MED ORDER — DICYCLOMINE HCL 10 MG PO CAPS
10.0000 mg | ORAL_CAPSULE | Freq: Three times a day (TID) | ORAL | Status: DC
  Administered 2024-10-22 – 2024-10-23 (×2): 10 mg via ORAL
  Filled 2024-10-22 (×4): qty 1

## 2024-10-22 MED ORDER — DICYCLOMINE HCL 10 MG PO CAPS: 10.0000 mg | ORAL_CAPSULE | Freq: Three times a day (TID) | ORAL | Status: DC

## 2024-10-22 MED ORDER — FUROSEMIDE 40 MG PO TABS
80.0000 mg | ORAL_TABLET | Freq: Once | ORAL | Status: AC
  Administered 2024-10-22: 80 mg via ORAL
  Filled 2024-10-22: qty 2

## 2024-10-22 MED ORDER — GLYCERIN (LAXATIVE) 2 G RE SUPP
1.0000 | Freq: Once | RECTAL | Status: DC
  Filled 2024-10-22: qty 1

## 2024-10-22 MED ORDER — HYDROXYZINE HCL 10 MG PO TABS
10.0000 mg | ORAL_TABLET | Freq: Four times a day (QID) | ORAL | Status: DC | PRN
  Administered 2024-10-22: 10 mg via ORAL
  Filled 2024-10-22 (×2): qty 1

## 2024-10-22 MED ORDER — CHLORHEXIDINE GLUCONATE CLOTH 2 % EX PADS
6.0000 | MEDICATED_PAD | Freq: Every day | CUTANEOUS | Status: DC
  Administered 2024-10-22 – 2024-10-23 (×2): 6 via TOPICAL

## 2024-10-22 MED ORDER — ALUM & MAG HYDROXIDE-SIMETH 200-200-20 MG/5ML PO SUSP: 30.0000 mL | ORAL | Status: DC | PRN

## 2024-10-22 NOTE — Progress Notes (Signed)
 " [facility] Progress Note   Subjective:    Seen and examined patient at bedside. Received HD early this morning (4am) and 3L was removed. She reports her breathing has unchanged. Reports sternum pain when taking a deep breath. Plan for HD tomorrow morning per her usual HD schedule. Plan for possible discharge tomorrow.  Objective Vitals:   10/22/24 0300 10/22/24 0520 10/22/24 0521 10/22/24 0742  BP: 130/69  (!) 148/74 139/62  Pulse: 66  75 74  Resp: 18  17 18   Temp: 97.6 F (36.4 C)  98.4 F (36.9 C) 99.4 F (37.4 C)  TempSrc: Oral  Oral Oral  SpO2: 100%  100% 100%  Weight: 72 kg 72.6 kg    Height:       Physical Exam General: Alert female in NAD Heart: RRR, no murmurs Lungs: CTA bilaterally, respirations unlabored Abdomen: Soft, non-distended, +BS Extremities: no edema b/l lower extremities Dialysis Access: LUE AVF + t/b  [facility] Weights   10/21/24 2300 10/22/24 0300 10/22/24 0520  Weight: 75 kg 72 kg 72.6 kg    Intake/Output Summary (Last 24 hours) at 10/22/2024 0940 Last data filed at 10/21/2024 1700 Gross per 24 hour  Intake 1013.33 ml  Output --  Net 1013.33 ml    Additional Objective Labs: Basic Metabolic Panel: Recent Labs  Lab 10/20/24 1444 10/21/24 0019 10/22/24 0538  NA 137 137 132*  K 4.5 5.2* 3.9  CL 96* 96* 93*  CO2 29 29 30   GLUCOSE 78 89 141*  BUN 34* 39* 27*  CREATININE 6.29* 7.57* 5.28*  CALCIUM  9.8 9.0 8.8*  PHOS  --  4.1 2.6   Liver Function Tests: Recent Labs  Lab 10/20/24 1444 10/21/24 0019 10/22/24 0538  AST 29  --   --   ALT 27  --   --   ALKPHOS 163*  --   --   BILITOT 0.4  --   --   PROT 7.7  --   --   ALBUMIN 4.0 3.5 3.6   Recent Labs  Lab 10/20/24 1444  LIPASE 32   CBC: Recent Labs  Lab 10/20/24 1444 10/21/24 0019 10/22/24 0538  WBC 5.3 5.2 4.2  HGB 8.4* 7.4* 8.2*  HCT 26.6* 23.6* 25.5*  MCV 84.7 85.8 84.7  PLT 182 184 165   Blood Culture    Component Value Date/Time   SDES BLOOD RIGHT  FOREARM 12/02/2022 1710   SPECREQUEST  12/02/2022 1710    BOTTLES DRAWN AEROBIC AND ANAEROBIC Blood Culture results may not be optimal due to an inadequate volume of blood received in culture bottles   CULT  12/02/2022 1710    NO GROWTH 5 DAYS Performed at [facility], [address] [place], [place] [address]    REPTSTATUS 12/07/2022 FINAL 12/02/2022 1710    Cardiac Enzymes: No results for input(s): CKTOTAL, CKMB, CKMBINDEX, TROPONINI in the last 168 hours. CBG: Recent Labs  Lab 10/21/24 1719 10/21/24 2118 10/22/24 0745  GLUCAP 92 83 121*   Iron  Studies: No results for input(s): IRON , TIBC, TRANSFERRIN, FERRITIN in the last 72 hours. Lab Results  Component Value Date   INR 1.3 (H) 10/29/2023   INR 1.6 (H) 01/30/2023   INR 2.99 (H) 11/03/2015   Studies/Results: CT Angio Abd/Pel w/ and/or w/o Result Date: 10/21/2024 EXAM: CTA ABDOMEN AND PELVIS WITHOUT AND WITH CONTRAST 10/21/2024 10:25:04 PM TECHNIQUE: CTA images of the abdomen and pelvis with intravenous contrast. 100 mL (iohexol  (OMNIPAQUE ) 350 MG/ML injection 100 mL IOHEXOL   350 MG/ML SOLN) was administered. Three-dimensional MIP/volume rendered formations were performed. Automated exposure control, iterative reconstruction, and/or weight based adjustment of the mA/kV was utilized to reduce the radiation dose to as low as reasonably achievable. COMPARISON: None available. CLINICAL HISTORY: Diffuse abdominal pain, possible mesenteric ischemia. FINDINGS: VASCULATURE: AORTA: Atherosclerotic calcifications of the aorta are seen. No acute finding. No abdominal aortic aneurysm. No dissection. CELIAC TRUNK: Mild calcifications are noted at the origin. No occlusion or significant stenosis. SUPERIOR MESENTERIC ARTERY: No acute finding. No occlusion or significant stenosis. RENAL ARTERIES: No acute finding. No occlusion or significant stenosis. ILIAC ARTERIES: No acute finding. No occlusion or significant stenosis. LIVER:  The liver is unremarkable. GALLBLADDER AND BILE DUCTS: Gallbladder is unremarkable. No biliary ductal dilatation. SPLEEN: A stable cyst is noted within the spleen. PANCREAS: The pancreas is unremarkable. ADRENAL GLANDS: Within normal limits. KIDNEYS, URETERS AND BLADDER: Kidneys are atrophic in appearance, similar to that seen on the prior exam. No stones in the kidneys or ureters. No hydronephrosis. No perinephric or periureteral stranding. The bladder is decompressed. GI AND BOWEL: Stomach and small bowel are unremarkable. Scattered diverticular changes of the colon are noted. No findings to suggest diverticulitis are seen. The appendix is within normal limits. No findings to suggest GI hemorrhage are noted. There is no bowel obstruction. No abnormal bowel wall thickening or distension. REPRODUCTIVE: The uterus has been surgically removed. PERITONEUM AND RETROPERITONEUM: No ascites or free air. LUNG BASE: Lung bases demonstrate mild scarring, stable from prior exams. LYMPH NODES: No lymphadenopathy. BONES AND SOFT TISSUES: No acute bony abnormalities noted. Degenerative changes of the lumbar spine are seen. 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 No acute soft tissue abnormality. HEART AND MEDIASTINUM: The heart is significantly enlarged in size. IMPRESSION: 1. No evidence of mesenteric ischemia or visceral arterial abnormality. 2.  Chronic changes as described above. Electronically signed by: Oneil Devonshire MD 10/21/2024 10:38 PM EST RP Workstation: [id number]   US  Abdomen Limited RUQ (LIVER/GB) Result Date: 10/21/2024 EXAM: Right Upper Quadrant Abdominal Ultrasound 10/21/2024 02:08:34 AM TECHNIQUE: Real-time ultrasonography of the right upper quadrant of the abdomen was performed. COMPARISON: None available. CLINICAL HISTORY: RUQ pain. FINDINGS: LIVER: Normal echogenicity. No intrahepatic biliary ductal dilatation. No evidence of mass. Hepatopetal flow in the portal vein. BILIARY  SYSTEM: No pericholecystic fluid or wall thickening. Small amount of sludge suspected within the gallbladder, but no shadowing stones are identified. Common bile duct is within normal limits measuring 5 mm. OTHER: No right upper quadrant ascites. A small amount of right pleural fluid is seen. IMPRESSION: 1. Small amount of sludge suspected within the gallbladder, no shadowing stones identified. 2. Small amount of right pleural fluid. Electronically signed by: Franky Stanford MD 10/21/2024 02:40 AM EST RP Workstation: [id number]   CT ABDOMEN PELVIS W CONTRAST Result Date: 10/20/2024 EXAM: CT ABDOMEN AND PELVIS WITH CONTRAST 10/20/2024 08:50:00 PM TECHNIQUE: CT of the abdomen and pelvis was performed with the administration of 75 mL of iohexol  (OMNIPAQUE ) 350 MG/ML injection. Multiplanar reformatted images are provided for review. Automated exposure control, iterative reconstruction, and/or weight-based adjustment of the mA/kV was utilized to reduce the radiation dose to as low as reasonably achievable. COMPARISON: 06/09/2024. CLINICAL HISTORY: acute abdominal pain. FINDINGS: LOWER CHEST: Cardiomegaly. Small right pleural effusion. Bibasilar atelectasis. LIVER: The liver is unremarkable. GALLBLADDER AND BILE DUCTS: Tiny layering gallstones within the gallbladder. No biliary ductal dilatation. SPLEEN: Cysts or hemangiomas in the spleen. PANCREAS: No acute abnormality. ADRENAL GLANDS: No acute abnormality. KIDNEYS,  URETERS AND BLADDER: Kidneys are atrophic. Hypodense area in the lower pole of the left kidney measures up to 2.2 cm. This is stable since imaging back to 09/29/2023 and its most compatible with cyst. No stones in the kidneys or ureters. No hydronephrosis. No perinephric or periureteral stranding. Urinary bladder is unremarkable. GI AND BOWEL: Stomach demonstrates no acute abnormality. Normal appendix. Sigmoid diverticulosis. There is no bowel obstruction. PERITONEUM AND RETROPERITONEUM: No ascites. No free  air. VASCULATURE: Aorta is normal in caliber. Aortic atherosclerosis. LYMPH NODES: No lymphadenopathy. REPRODUCTIVE ORGANS: No acute abnormality. 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 No acute osseous abnormality. No focal soft tissue abnormality. IMPRESSION: 1. No acute findings in the abdomen or pelvis. 2. Cholelithiasis. 3. Cardiomegaly, small right pleural effusion. 4. Sigmoid diverticulosis. Electronically signed by: Franky Crease MD 10/20/2024 09:40 PM EST RP Workstation: [id number]   DG Chest Port 1 View Result Date: 10/20/2024 CLINICAL DATA:  Shortness of breath. EXAM: PORTABLE CHEST 1 VIEW COMPARISON:  10/08/2024. FINDINGS: The heart is enlarged and the mediastinal contour is within normal limits. The pulmonary vasculature is distended. Interstitial prominence is present bilaterally with mild airspace disease at the lung bases. Possible trace bilateral pleural effusions. No pneumothorax is seen. Stable radiopaque densities are present over the left upper chest. No acute osseous abnormality. IMPRESSION: 1. Cardiomegaly with pulmonary vascular congestion. 2. Interstitial prominence bilaterally with mild airspace disease at the lung bases, possible edema or infiltrate. 3. Possible trace bilateral pleural effusions. Electronically Signed   By: Leita Birmingham M.D.   On: 10/20/2024 15:49    Medications:   acetaminophen   1,000 mg Oral Once   amiodarone   200 mg Oral Daily   atorvastatin   40 mg Oral Daily   carvedilol   3.125 mg Oral BID WC   diclofenac  Sodium  4 g Topical QID   Glycerin  (Adult)  1 suppository Rectal Once   hydrALAZINE   75 mg Oral TID   HYDROmorphone   2 mg Oral Q12H   insulin  aspart  0-9 Units Subcutaneous TID WC   isosorbide  dinitrate  20 mg Oral TID   lactose free nutrition  237 mL Oral Daily   linaclotide   145 mcg Oral QAC breakfast   multivitamin  1 tablet Oral Daily   nystatin    Topical BID    pantoprazole   40 mg Oral BID   polyethylene glycol  17 g Oral Daily   senna  2 tablet Oral BID   sevelamer  carbonate  800 mg Oral TID WC    Dialysis Orders: AF-TTS: L AVF; 2K, 2 calcium  bath. EDW 69.5 kg. No heparin . Last Mircera 10/16/2024, 150 mcg. Last TSAT 34%.   Assessment/Plan: ESRD THS using LUE AVF: Received an extra HD early this AM (4am) and 3L was removed. Plan for HD tomorrow per her routine schedule. Reinforced fluid restrictions. She says BP has been dropping on dialysis sometimes but is generally elevated. Will follow trend and adjust meds PRN. Reminded to hold her antihypertensive medications prior to dialysis. Continue daily weights, daily renal panel, strict I/Os, avoid nephrotoxins (NSAIDs, judicious IV Contrast) Volume overload: Chronic problem.  As above. Anemia: Hemoglobin 7.4, FOBT negative. not due for ESA, no indication for iron .  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  Resume outpatient Hectorol  3 mcg if prolonged hospitalization. Abdominal/epigastric pain: Workup thus far unrevealing.  Per primary.  May be related to volume overload. Dispo: Plan for possible discharge after HD  tomorrow    Audrey Piety, NP [facility] 10/22/2024,9:40 AM  LOS: 1 day    "

## 2024-10-22 NOTE — Progress Notes (Signed)
 Received patient in bed to unit.  Alert and oriented.  Informed consent signed and in chart.   TX duration: 3  Patient tolerated well.  Transported back to the room  Alert, without acute distress.  Hand-off given to patient's nurse.   Access used: L AVF Access issues: None  Total UF removed: 3000 Medication(s) given: Dilaudud 0.5mg  Post HD VS: [id number] B/P 130/69 Post HD weight: 72kg  Neville Seip, RN [facility]   10/22/24 0300  Vitals  Temp 97.6 F (36.4 C)  Temp Source Oral  BP 130/69  MAP (mmHg) 87  BP Location Left Arm  BP Method Manual  Patient Position (if appropriate) Sitting  Pulse Rate 66  Pulse Rate Source Monitor  ECG Heart Rate 66  Resp 18  Weight 72 kg  Type of Weight Post-Dialysis  Oxygen  Therapy  SpO2 100 %  O2 Device Nasal Cannula  O2 Flow Rate (L/min) 4 L/min  Patient Activity (if Appropriate) In bed  Pulse Oximetry Type Continuous  During Treatment Monitoring  Blood Flow Rate (mL/min) 0 mL/min  Arterial Pressure (mmHg) -34.95 mmHg  Venous Pressure (mmHg) 72.52 mmHg  TMP (mmHg) 14.75 mmHg  Ultrafiltration Rate (mL/min) 1233 mL/min  Dialysate Flow Rate (mL/min) 300 ml/min  Dialysate Potassium Concentration 2  Dialysate Calcium  Concentration 2.5  Duration of HD Treatment -hour(s) 3 hour(s)  Cumulative Fluid Removed (mL) per Treatment  3000.26  HD Safety Checks Performed Yes  Intra-Hemodialysis Comments Tolerated well;Tx completed  Note  Patient Observations Patient tolerated tread well

## 2024-10-22 NOTE — Plan of Care (Signed)

## 2024-10-22 NOTE — Progress Notes (Signed)
 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   Initial workup negative for pancreatitis and CT on admission did show cholelithiasis (RUQ US  negative) and sigmoid diverticulosis with increased stool burden.  Overnight Events: received HD  Interim History: Patient reports feeling slightly better this AM after HD, but overall still having difficulty with deep breath and 8/10 abdominal pain (previously 10/10). Reports still feeling like she has fluid on. Patient states concern regarding the amount of medications she's on and if they could be contributing to abdominal pain. Overall said she is not ready to go home yet.   OBJECTIVE:  Vital Signs: Vitals:   10/22/24 0300 10/22/24 0520 10/22/24 0521 10/22/24 0742  BP: 130/69  (!) 148/74 139/62  Pulse: 66  75 74  Resp: 18  17 18   Temp: 97.6 F (36.4 C)  98.4 F (36.9 C) 99.4 F (37.4 C)  TempSrc: Oral  Oral Oral  SpO2: 100%  100% 100%  Weight: 72 kg 72.6 kg    Height:       Supplemental O2: 3 [place] SpO2: 100 % O2 Flow Rate (L/min): 4 L/min  Filed Weights   10/21/24 2300 10/22/24 0300 10/22/24 0520  Weight: 75 kg 72 kg 72.6 kg     Intake/Output Summary (Last 24 hours) at 10/22/2024 0944 Last data filed at 10/21/2024 1700 Gross per 24 hour  Intake 1013.33 ml  Output --  Net 1013.33 ml   Net IO Since Admission: 1,013.33 mL [10/22/24 0944]  Physical Exam: Physical Exam Constitutional:      General: She is not in acute distress.    Appearance: She is not ill-appearing or toxic-appearing.  Cardiovascular:      Rate and Rhythm: Normal rate and regular rhythm.     Heart sounds: No murmur heard.    No friction rub. No gallop.  Pulmonary:     Effort: Pulmonary effort is normal. No accessory muscle usage or respiratory distress.     Breath sounds: Examination of the left-lower field reveals rales. Rales present. No decreased breath sounds, wheezing or rhonchi.  Abdominal:     General: Bowel sounds are normal.     Palpations: Abdomen is soft.     Tenderness: There is generalized abdominal tenderness and tenderness in the epigastric area. There is no guarding.  Musculoskeletal:     Right lower leg: No tenderness. No edema.     Left lower leg: No tenderness. No edema.  Skin:    General: Skin is warm and dry.  Neurological:     Mental Status: She is alert.     Patient Lines/Drains/Airways Status     Active Line/Drains/Airways     Name Placement date Placement time Site Days   Peripheral IV 10/21/24 20 G 1.88 Anterior;Right Forearm 10/21/24  1924  Forearm  1   Fistula / Graft Left Upper arm Arteriovenous fistula 08/11/21  0836  Upper arm  1168            Pertinent labs and imaging:      Latest Ref Rng & Units 10/22/2024  5:38 AM 10/21/2024   12:19 AM 10/20/2024    2:44 PM  CBC  WBC 4.0 - 10.5 K/uL 4.2  5.2  5.3   Hemoglobin 12.0 - 15.0 g/dL 8.2  7.4  8.4   Hematocrit 36.0 - 46.0 % 25.5  23.6  26.6   Platelets 150 - 400 K/uL 165  184  182        Latest Ref Rng & Units 10/22/2024    5:38 AM 10/21/2024   12:19 AM 10/20/2024    2:44 PM  CMP  Glucose 70 - 99 mg/dL 858  89  78   BUN 8 - 23 mg/dL 27  39  34   Creatinine 0.44 - 1.00 mg/dL 4.71  2.42  3.70   Sodium 135 - 145 mmol/L 132  137  137   Potassium 3.5 - 5.1 mmol/L 3.9  5.2  4.5   Chloride 98 - 111 mmol/L 93  96  96   CO2 22 - 32 mmol/L 30  29  29    Calcium  8.9 - 10.3 mg/dL 8.8  9.0  9.8   Total Protein 6.5 - 8.1 g/dL   7.7   Total Bilirubin 0.0 - 1.2 mg/dL   0.4   Alkaline Phos 38 - 126 U/L   163   AST 15 - 41 U/L   29    ALT 0 - 44 U/L   27     CT Angio Abd/Pel w/ and/or w/o Result Date: 10/21/2024 EXAM: CTA ABDOMEN AND PELVIS WITHOUT AND WITH CONTRAST 10/21/2024 10:25:04 PM TECHNIQUE: CTA images of the abdomen and pelvis with intravenous contrast. 100 mL (iohexol  (OMNIPAQUE ) 350 MG/ML injection 100 mL IOHEXOL  350 MG/ML SOLN) was administered. Three-dimensional MIP/volume rendered formations were performed. Automated exposure control, iterative reconstruction, and/or weight based adjustment of the mA/kV was utilized to reduce the radiation dose to as low as reasonably achievable. COMPARISON: None available. CLINICAL HISTORY: Diffuse abdominal pain, possible mesenteric ischemia. FINDINGS: VASCULATURE: AORTA: Atherosclerotic calcifications of the aorta are seen. No acute finding. No abdominal aortic aneurysm. No dissection. CELIAC TRUNK: Mild calcifications are noted at the origin. No occlusion or significant stenosis. SUPERIOR MESENTERIC ARTERY: No acute finding. No occlusion or significant stenosis. RENAL ARTERIES: No acute finding. No occlusion or significant stenosis. ILIAC ARTERIES: No acute finding. No occlusion or significant stenosis. LIVER: The liver is unremarkable. GALLBLADDER AND BILE DUCTS: Gallbladder is unremarkable. No biliary ductal dilatation. SPLEEN: A stable cyst is noted within the spleen. PANCREAS: The pancreas is unremarkable. ADRENAL GLANDS: Within normal limits. KIDNEYS, URETERS AND BLADDER: Kidneys are atrophic in appearance, similar to that seen on the prior exam. No stones in the kidneys or ureters. No hydronephrosis. No perinephric or periureteral stranding. The bladder is decompressed. GI AND BOWEL: Stomach and small bowel are unremarkable. Scattered diverticular changes of the colon are noted. No findings to suggest diverticulitis are seen. The appendix is within normal limits. No findings to suggest GI hemorrhage are noted. There is no bowel obstruction. No abnormal bowel wall thickening or  distension. REPRODUCTIVE: The uterus has been surgically removed. PERITONEUM AND RETROPERITONEUM: No ascites or free air. LUNG BASE: Lung bases demonstrate mild scarring, stable from prior exams. LYMPH NODES: No lymphadenopathy. BONES AND SOFT TISSUES: No acute bony abnormalities noted. Degenerative changes of the lumbar spine are seen. 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 No acute soft tissue abnormality. HEART AND MEDIASTINUM: The heart is significantly enlarged in size. IMPRESSION: 1. No evidence of mesenteric ischemia or  visceral arterial abnormality. 2.  Chronic changes as described above. Electronically signed by: Oneil Devonshire MD 10/21/2024 10:38 PM EST RP Workstation: [id number]    ASSESSMENT/PLAN:  Assessment: Principal Problem:   Hypervolemia Active Problems:   Hypertension   GERD (gastroesophageal reflux disease)   Atrial fibrillation (HCC)   Anemia of chronic disease   Chronic pain syndrome   Heart failure with recovered ejection fraction (HFrecEF) (HCC)   ESRD on dialysis (HCC)   Prolonged QT syndrome   Chronic hypoxic respiratory failure, on home oxygen  therapy (HCC)   Asymptomatic bacteriuria   Elevated troponin   Plan: #Abdominal Pain  GERD Constipation  CTA abdomen and pelvis with and without contrast negative for mesenteric ischemia or visceral arterial abnormality. Patient reports improved pain after HD and states that she is not ready to go home today because she would like further workup and management. Patient stated she has not had a good bowel movement in several days. Abdominal pain given negative imaging findings possibly related to stool burden. It appears on chart review that patient was previously worked up for similar epigastric pain in 2017. She has known GERD and peptic ulcer disease, had a normal EGD and colonoscopy on 01/16/2016, was on Bentyl  previously for symptom control. Will try Bentyl  again and monitor for symptom  improvement.  - Bowel regimen:   - Bentyl  10 mg TID before meals            - glycerin  suppository once  - senna 17.2 mg (2 tablets) BID  - miralax  17 g oral daily   - protonix  40 mg BID  - gave Maalox/mylanta and lidocaine  viscous mouth solution PRN - Tigan  200 mg IM q 8 hours PRN nausea  - Pain regimen:   - Tylenol  1000 mg q 6 hours PRN   - dilaudid  2 mg q 12 hours (chronic)  #ESRD HD TTS #HFrecEF (LVEF 50-55% 10/2024 at [facility]) Chronic Normocytic Anemia  Chronic Hypoxic Respiratory Failure - home 3 L [place] HTN HD last night, 3 L off. Patient on home 3 L [place] satting at 100%. Patient reports she still feels fluid on. Plan for HD tomorrow.  - daily In/outs - daily weights  - continue carvedilol  3.125 mg BID with meals : Hold prior to HD - continue hydralazine  75 mg TID : Hold prior to HD - continue Isordil  20 mg TID : Hold prior to HD - monitor Hgb, transfuse if <8 and not fluid overloaded - continue Renvela  800 mg TID with meals  - daily RFPs - hold iron  and hectorol  3 mcg  - DuoNeb 3 mL q 4 hours PRN ordered   Melena  Hx GAVE Patient reports several darks stools. Takes oral iron  supplementation at home. Fecal occult blood testing negative.   #Afib Hx DVT  Qtc prolongation  NSR, Qtc 512 ms. Managed with amiodarone  200 mg daily. Not on AC due to GAVE.  -Continue home amiodarone  200 mg daily  -Avoid Qtc prolonging meds -SCDs -cardiac telemetry   #Chronic Pain Syndrome  Follows with Dr. Fidel, has hip arthoplasty scheduled for 1/29. Managed with hydromorphone  2 mg Q 12 Hrs at home.  - Tylenol  500 mg q 6 hours PRN  - dilaudid  2 mg q 12 hours  - dilaudid  0.5 mg IV q 4 hours PRN  - continue lidocaine  patch PRN   Hyperlipidemia  - continue atorvostatin 40 mg daily   Bacteruria  Patients UA cloudy, pH9, hgb small, with 100 protien, trace leukocytes, and many bacteria.  No specific urinary complaints.   T2DM Nutrition  A1c 5.1 - monitor glucose - continue multivitamin   - continue Boost Plus   Best Practice: Diet: Renal diet VTE: TED hose to OR Start: 10/21/24 1405 Place and maintain sequential compression device Start: 10/21/24 0112 SCDs Start: 10/20/24 2008 Code: Full  Disposition planning: Therapy Recs: Home Health, DME: none DISPO: Anticipated discharge pending clinical improvement Signature:  Camilia Caywood [facility]  9:44 AM, 10/22/2024  On Call pager [phone]  "

## 2024-10-22 NOTE — Progress Notes (Signed)
 Patient was seen with a pill bottle and pills laid out on her bed.  I told her those will need to be counted and sent to pharmacy.  She was compliant.  2nd Verifier Yvette Bras, [employer]. Pharmacy to send form and they will be stored there until discharge.

## 2024-10-22 NOTE — Progress Notes (Signed)
 Pt handed RN 5 pills without label (3 pink pill, 1 green pill, and 1 white pill. Unknown meds. Placed med in the bag and put it in pt's chart.   Amado GORMAN Arabia, RN

## 2024-10-23 ENCOUNTER — Other Ambulatory Visit (HOSPITAL_COMMUNITY): Payer: Self-pay

## 2024-10-23 DIAGNOSIS — E8770 Fluid overload, unspecified: Secondary | ICD-10-CM | POA: Diagnosis not present

## 2024-10-23 DIAGNOSIS — K5900 Constipation, unspecified: Secondary | ICD-10-CM | POA: Diagnosis not present

## 2024-10-23 DIAGNOSIS — K219 Gastro-esophageal reflux disease without esophagitis: Secondary | ICD-10-CM | POA: Diagnosis not present

## 2024-10-23 DIAGNOSIS — R1013 Epigastric pain: Secondary | ICD-10-CM | POA: Diagnosis not present

## 2024-10-23 DIAGNOSIS — F329 Major depressive disorder, single episode, unspecified: Secondary | ICD-10-CM

## 2024-10-23 LAB — CBC
HCT: 25.4 % — ABNORMAL LOW (ref 36.0–46.0)
Hemoglobin: 8.2 g/dL — ABNORMAL LOW (ref 12.0–15.0)
MCH: 27.2 pg (ref 26.0–34.0)
MCHC: 32.3 g/dL (ref 30.0–36.0)
MCV: 84.4 fL (ref 80.0–100.0)
Platelets: 172 K/uL (ref 150–400)
RBC: 3.01 MIL/uL — ABNORMAL LOW (ref 3.87–5.11)
RDW: 19.3 % — ABNORMAL HIGH (ref 11.5–15.5)
WBC: 3.8 K/uL — ABNORMAL LOW (ref 4.0–10.5)
nRBC: 0 % (ref 0.0–0.2)

## 2024-10-23 LAB — RENAL FUNCTION PANEL
Albumin: 3.6 g/dL (ref 3.5–5.0)
Anion gap: 11 (ref 5–15)
BUN: 46 mg/dL — ABNORMAL HIGH (ref 8–23)
CO2: 27 mmol/L (ref 22–32)
Calcium: 9 mg/dL (ref 8.9–10.3)
Chloride: 94 mmol/L — ABNORMAL LOW (ref 98–111)
Creatinine, Ser: 7.73 mg/dL — ABNORMAL HIGH (ref 0.44–1.00)
GFR, Estimated: 5 mL/min — ABNORMAL LOW
Glucose, Bld: 82 mg/dL (ref 70–99)
Phosphorus: 4.3 mg/dL (ref 2.5–4.6)
Potassium: 5.6 mmol/L — ABNORMAL HIGH (ref 3.5–5.1)
Sodium: 132 mmol/L — ABNORMAL LOW (ref 135–145)

## 2024-10-23 MED ORDER — CARVEDILOL 3.125 MG PO TABS: 3.1250 mg | ORAL_TABLET | Freq: Two times a day (BID) | ORAL | Status: AC

## 2024-10-23 MED ORDER — LIDOCAINE-PRILOCAINE 2.5-2.5 % EX CREA: 1.0000 | TOPICAL_CREAM | CUTANEOUS | Status: DC | PRN

## 2024-10-23 MED ORDER — HYDROMORPHONE HCL 1 MG/ML IJ SOLN
0.5000 mg | Freq: Once | INTRAMUSCULAR | Status: AC
  Administered 2024-10-23: 0.5 mg via INTRAVENOUS
  Filled 2024-10-23: qty 0.5

## 2024-10-23 MED ORDER — LIDOCAINE HCL (PF) 1 % IJ SOLN: 5.0000 mL | INTRAMUSCULAR | Status: DC | PRN

## 2024-10-23 MED ORDER — ISOSORBIDE DINITRATE 20 MG PO TABS: 20.0000 mg | ORAL_TABLET | Freq: Three times a day (TID) | ORAL | Status: AC

## 2024-10-23 MED ORDER — ALTEPLASE 2 MG IJ SOLR: 2.0000 mg | Freq: Once | INTRAMUSCULAR | Status: DC | PRN

## 2024-10-23 MED ORDER — HYDROMORPHONE HCL 2 MG PO TABS
ORAL_TABLET | ORAL | Status: AC
  Filled 2024-10-23: qty 1

## 2024-10-23 MED ORDER — DICYCLOMINE HCL 10 MG PO CAPS
10.0000 mg | ORAL_CAPSULE | Freq: Three times a day (TID) | ORAL | 0 refills | Status: AC
  Filled 2024-10-23: qty 90, 30d supply, fill #0

## 2024-10-23 MED ORDER — HYDRALAZINE HCL 50 MG PO TABS: 75.0000 mg | ORAL_TABLET | Freq: Three times a day (TID) | ORAL | Status: AC

## 2024-10-23 MED ORDER — PENTAFLUOROPROP-TETRAFLUOROETH EX AERO: 1.0000 | INHALATION_SPRAY | CUTANEOUS | Status: DC | PRN

## 2024-10-23 MED ORDER — HEPARIN SODIUM (PORCINE) 1000 UNIT/ML DIALYSIS: 1000.0000 [IU] | INTRAMUSCULAR | Status: DC | PRN

## 2024-10-23 NOTE — Discharge Planning (Signed)
 [facility] Patient Discharge Orders- [facility] CLINIC: [facility]  Patient's name: Audrey Hill Admit/DC Dates: 10/20/2024 - 10/23/2024  Discharge Diagnoses: Volume overload - resolved with HD  Chronic ABD pain with hx GERD and PUD - Per dc summary, PUD w/u in 2 weeks, pain clinic referral, General surgery for GB  Aranesp : Given: No     Last Hgb: 8.2 PRBC's Given: Yes  Date/# of units: 1 unit PRBCs given on 1/18 ESA dose for discharge: Resume mircera 150 mcg IV q 2 weeks  IV Iron  dose at discharge: Resume weekly Venofer   Heparin  change: No  EDW Change: Yes  New EDW: Lower to 69kg. Last post weight here was 69.9kg.  [place] Change: No  Access intervention/Change: No  Hectorol  change: No  Discharge Labs: Calcium  9.0  Phosphorus 4.3  Albumin 3.6  K+ 5.6  IV Antibiotics: No  On Coumadin ?: No   OTHER/APPTS/LAB ORDERS:  Re-check K+ level at next HD     D/C Meds to be reconciled by nurse after every discharge.  Completed By: Charmaine Piety, NP   Reviewed by: MD:______ RN_______

## 2024-10-23 NOTE — Care Management Important Message (Signed)
 Important Message  Patient Details  Name: Audrey Hill MRN: [id number] Date of Birth: 10/21/1955   Important Message Given:  Yes - Medicare IM     Jennie Laneta Dragon 10/23/2024, 1:47 PM

## 2024-10-23 NOTE — Progress Notes (Incomplete)
 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   Initial workup negative for pancreatitis and CT on admission did show cholelithiasis (RUQ US  negative) and sigmoid diverticulosis with increased stool burden.  Overnight Events: received x2 0.5 mg IV dilaudid  for breakthrough pain.   Interim History: Attempting bentyl , was on previously and taken off for unknown reason.  HD today.   OBJECTIVE:  Vital Signs: Vitals:   10/22/24 1824 10/22/24 1936 10/23/24 0518 10/23/24 0610  BP:  (!) 125/59 (!) 145/75   Pulse:  61 63   Resp:  19 19   Temp:  99.1 F (37.3 C) 98.4 F (36.9 C)   TempSrc:  Oral Oral   SpO2: 94% 100% 100%   Weight:    75.6 kg  Height:       Supplemental O2: 3 [place] SpO2: 100 % O2 Flow Rate (L/min): 3 L/min  Filed Weights   10/22/24 0300 10/22/24 0520 10/23/24 0610  Weight: 72 kg 72.6 kg 75.6 kg     Intake/Output Summary (Last 24 hours) at 10/23/2024 0712 Last data filed at 10/22/2024 1800 Gross per 24 hour  Intake 1080 ml  Output --  Net 1080 ml   Net IO Since Admission: 2,093.33 mL [10/23/24 0712]  Physical Exam: Physical Exam Constitutional:      General: She is not in acute distress.    Appearance: She is not ill-appearing or toxic-appearing.  Cardiovascular:     Rate and Rhythm: Normal rate and regular rhythm.     Heart sounds: No murmur heard.    No friction rub. No gallop.  Pulmonary:     Effort: Pulmonary effort is normal. No accessory muscle usage or respiratory distress.     Breath sounds: Examination of the left-lower field  reveals rales. Rales present. No decreased breath sounds, wheezing or rhonchi.  Abdominal:     General: Bowel sounds are normal.     Palpations: Abdomen is soft.     Tenderness: There is generalized abdominal tenderness and tenderness in the epigastric area. There is no guarding.  Musculoskeletal:     Right lower leg: No tenderness. No edema.     Left lower leg: No tenderness. No edema.  Skin:    General: Skin is warm and dry.  Neurological:     Mental Status: She is alert.     Patient Lines/Drains/Airways Status     Active Line/Drains/Airways     Name Placement date Placement time Site Days   Peripheral IV 10/21/24 20 G 1.88 Anterior;Right Forearm 10/21/24  1924  Forearm  2   Fistula / Graft Left Upper arm Arteriovenous fistula 08/11/21  0836  Upper arm  1169   Fistula / Graft Left Upper arm --  --  Upper arm  --            Pertinent labs and imaging:      Latest Ref Rng & Units 10/23/2024    5:13 AM 10/22/2024    5:38 AM 10/21/2024   12:19 AM  CBC  WBC 4.0 - 10.5 K/uL 3.8  4.2  5.2   Hemoglobin 12.0 - 15.0 g/dL 8.2  8.2  7.4   Hematocrit 36.0 - 46.0 % 25.4  25.5  23.6   Platelets 150 - 400 K/uL 172  165  184        Latest Ref Rng & Units 10/23/2024    5:13 AM 10/22/2024    5:38 AM 10/21/2024   12:19 AM  CMP  Glucose 70 - 99 mg/dL 82  858  89   BUN 8 - 23 mg/dL 46  27  39   Creatinine 0.44 - 1.00 mg/dL 2.26  4.71  2.42   Sodium 135 - 145 mmol/L 132  132  137   Potassium 3.5 - 5.1 mmol/L 5.6  3.9  5.2   Chloride 98 - 111 mmol/L 94  93  96   CO2 22 - 32 mmol/L 27  30  29    Calcium  8.9 - 10.3 mg/dL 9.0  8.8  9.0     No results found.   ASSESSMENT/PLAN:  Assessment: Principal Problem:   Hypervolemia Active Problems:   Hypertension   GERD (gastroesophageal reflux disease)   Atrial fibrillation (HCC)   Anemia of chronic disease   Chronic pain syndrome   Heart failure with recovered ejection fraction (HFrecEF) (HCC)   ESRD on dialysis (HCC)   Prolonged  QT syndrome   Chronic hypoxic respiratory failure, on home oxygen  therapy (HCC)   Asymptomatic bacteriuria   Elevated troponin   Plan: #Abdominal Pain  GERD Hx PUD Constipation  CTA abdomen and pelvis with and without contrast negative for mesenteric ischemia or visceral arterial abnormality. Patient reports improved pain after HD and states that she is not ready to go home today because she would like further workup and management. Patient stated she has not had a good bowel movement in several days. Abdominal pain given negative imaging findings possibly related to stool burden. It appears on chart review that patient was previously worked up for similar epigastric pain in 2017. She has known GERD and peptic ulcer disease, had a normal EGD and colonoscopy on 01/16/2016, was on Bentyl  previously for symptom control. Will try Bentyl  again and monitor for symptom improvement.   She's been on protonix  so I'm not sure how helpful testing will be. Would need to be off for 2 weeks. Switch to famotidine . Discuss with patient that dilaudid  can reduce gastric motility and may worsen symptoms in the long run. She is on the medication chronically, referral to pain clinic may be indicated to stop chronic use and hopeful improvement of abdominal pain. Had x 8 BM   - Bowel regimen:   - Bentyl  10 mg TID before meals            - glycerin  suppository once  - senna 17.2 mg (2 tablets) BID  - miralax  17 g oral daily   - protonix  40 mg BID  - gave Maalox/mylanta and lidocaine  viscous mouth solution PRN - Tigan  200 mg IM q 8 hours PRN nausea  - Pain regimen:   - Tylenol  1000 mg q 6 hours PRN   - dilaudid  2 mg q 12 hours (chronic)  #ESRD HD TTS #HFrecEF (LVEF 50-55% 10/2024 at [facility]) Chronic Normocytic Anemia  Chronic Hypoxic Respiratory Failure - home 3 L [place] HTN HD today -- + 1L oral. Stated previously she hates a fluid restrictio bc then she feels dehydrated.  - daily In/outs - daily weights  -  continue carvedilol  3.125 mg BID with meals : Hold prior  to HD - continue hydralazine  75 mg TID : Hold prior to HD - continue Isordil  20 mg TID : Hold prior to HD - monitor Hgb, transfuse if <8 and not fluid overloaded - continue Renvela  800 mg TID with meals  - daily RFPs - hold iron  and hectorol  3 mcg  - DuoNeb 3 mL q 4 hours PRN ordered   Melena  Hx GAVE Patient reports several darks stools. Takes oral iron  supplementation at home. Fecal occult blood testing negative.   #Afib Hx DVT  Qtc prolongation  NSR, Qtc 512 ms. Managed with amiodarone  200 mg daily. Not on AC due to GAVE.  -Continue home amiodarone  200 mg daily  -Avoid Qtc prolonging meds -SCDs -cardiac telemetry   #Chronic Pain Syndrome  Follows with Dr. Fidel, has hip arthoplasty scheduled for 1/29. Managed with hydromorphone  2 mg Q 12 Hrs at home.  - Tylenol  500 mg q 6 hours PRN  - dilaudid  2 mg q 12 hours  - dilaudid  0.5 mg IV q 4 hours PRN  - continue lidocaine  patch PRN   Hyperlipidemia  - continue atorvostatin 40 mg daily   Bacteruria  Patients UA cloudy, pH9, hgb small, with 100 protien, trace leukocytes, and many bacteria. No specific urinary complaints.   T2DM Nutrition  A1c 5.1 - monitor glucose - continue multivitamin  - continue Boost Plus   Best Practice: Diet: Renal diet VTE: TED hose to OR Start: 10/21/24 1405 Place and maintain sequential compression device Start: 10/21/24 0112 SCDs Start: 10/20/24 2008 Code: Full  Disposition planning: Therapy Recs: Home Health, DME: none DISPO: Anticipated discharge pending clinical improvement Signature:  Andie Mortimer [facility]  7:12 AM, 10/23/2024  On Call pager [phone]  "

## 2024-10-23 NOTE — Progress Notes (Signed)
" °  Progress Note   Date: 10/23/2024  Patient Name: Audrey Hill        MRN#: [id number]  Review the patients clinical findings supports the diagnosis of:   Hyponatremia     "

## 2024-10-23 NOTE — Discharge Summary (Signed)
 "  Name: Audrey Hill MRN: [id number] DOB: [birth year] 69 y.o. PCP: [name], DO  Date of Admission: 10/20/2024  1:09 PM Date of Discharge: 10/23/2024 Attending Physician: Dr. Jone Dauphin  Discharge Diagnosis: 1. Principal Problem:   Hypervolemia Active Problems:   Hypertension   GERD (gastroesophageal reflux disease)   Atrial fibrillation (HCC)   Anemia of chronic disease   Chronic pain syndrome   Heart failure with recovered ejection fraction (HFrecEF) (HCC)   ESRD on dialysis (HCC)   Prolonged QT syndrome   Chronic hypoxic respiratory failure, on home oxygen  therapy (HCC)   Asymptomatic bacteriuria   Elevated troponin   Discharge Medications: Allergies as of 10/23/2024       Reactions   Diphenhydramine  Hcl    Other Reaction(s): Other (See Comments) mini stroke    Other reaction(s): Other (See Comments) mini stroke (Benadryl )   Lisinopril Rash, Swelling   Metoclopramide     Other reaction(s): Other Patient has dystonia with administration of drug.  Other reaction(s): Other (See Comments) Patient has dystonia with administration of drug.  Patient has dystonia with administration of drug.    Nortriptyline    Other reaction(s): Other (See Comments) unknown Mini stroke   Tramadol Nausea And Vomiting      Nitroglycerin  Hives, Rash   Tylenol  [acetaminophen ] Rash, Other (See Comments)   Tolerates percocet    Ibuprofen Other (See Comments)   Upset gi   Norvasc  [amlodipine  Besylate] Swelling   Nsaids Other (See Comments)   GI upset        Medication List     PAUSE taking these medications    pantoprazole  40 MG tablet Wait to take this until your doctor or other care provider tells you to start again. Commonly known as: PROTONIX  TAKE 1 TABLET BY MOUTH EVERY DAY       STOP taking these medications    losartan  50 MG tablet Commonly known as: COZAAR        TAKE these medications    albuterol  108 (90 Base) MCG/ACT inhaler Commonly  known as: VENTOLIN  HFA INHALE 2 PUFFS BY MOUTH EVERY 6 HOURS AS NEEDED FOR COUGH   amiodarone  200 MG tablet Commonly known as: PACERONE  TAKE 1 TABLET BY MOUTH DAILY   atorvastatin  40 MG tablet Commonly known as: LIPITOR TAKE 1 TABLET BY MOUTH DAILY   Boost Glucose Control Liqd Take 237 mLs by mouth daily.   carvedilol  3.125 MG tablet Commonly known as: COREG  Take 1 tablet (3.125 mg total) by mouth 2 (two) times daily with a meal. Do not take on days you have dialysis What changed: additional instructions   diclofenac  Sodium 1 % Gel Commonly known as: Voltaren  Apply 4 g topically 4 (four) times daily.   dicyclomine  10 MG capsule Commonly known as: BENTYL  Take 1 capsule (10 mg total) by mouth 3 (three) times daily before meals.   famotidine  20 MG tablet Commonly known as: PEPCID  Take 20 mg by mouth 2 (two) times daily.   ferrous sulfate 325 (65 FE) MG tablet Take 325 mg by mouth every other day.   furosemide  80 MG tablet Commonly known as: LASIX  Take 80mg  once in the morning on non-dialysis days.   hydrALAZINE  50 MG tablet Commonly known as: APRESOLINE  Take 1.5 tablets (75 mg total) by mouth 3 (three) times daily. Do not take on days you have dialysis What changed: additional instructions   HYDROmorphone  2 MG tablet Commonly known as: DILAUDID  Take 1 tablet (2 mg total) by mouth every 12 (  twelve) hours.   isosorbide  dinitrate 20 MG tablet Commonly known as: ISORDIL  Take 1 tablet (20 mg total) by mouth 3 (three) times daily.   lidocaine  5 % Commonly known as: Lidoderm  Place 1 patch onto the skin daily as needed. Remove & Discard patch within 12 hours or as directed by MD   MIRCERA IJ 100 mcg.   multivitamin Tabs tablet TAKE 1 TABLET BY MOUTH DAILY.   polyethylene glycol 17 g packet Commonly known as: MIRALAX  / GLYCOLAX  Take 17 g by mouth daily as needed for mild constipation.   senna 8.6 MG Tabs tablet Commonly known as: SENOKOT Take 2 tablets by mouth  daily as needed for mild constipation.   sevelamer  carbonate 800 MG tablet Commonly known as: RENVELA  Take 1 tablet (800 mg total) by mouth 3 (three) times daily with meals.        Disposition and follow-up:   [name] was discharged from [facility] in Good condition.  At the hospital follow up visit please address:  1.  Chronic Abdominal Pain/Chronic pain with hx of GERD and PUD: Patient would benefit from being seen by GI and possible General surgery for consultation of GB removal (per patient's request). She has been worked up previously but does not recall this. Added back bentyl  that was trailed in 2017 but patient did not report much relief. I discontinued pantoprazole  and restarted famotidine  to see if PUD workup could be completed outpatient. I also suspect that chronic dilaudid  use is worsening symptoms and since patient is adamant that it helps and does not hurt, possible referral to pain clinic for pain control optimization without opioids. Has procedure for hip scheduled next week and wants to have this done. - GI consult  - PUD workup in 2 weeks - General surgery for GB - Pain Clinic referral (has previously seen pain clinic - She is seen by Dr. Pleasant 2023, last due with Dr. Georgina in 02/2024?)  ESRD/HFrecEF/SOB: stayed on 3 L during hospitalization but continues to complain of feeling volume overloaded. Monitor symptoms. Holding BP meds on dialysis days with hx of hypotension during HD.   2.  Labs / imaging needed at time of follow-up: n/a  3.  Pending labs/ test needing follow-up: n/a  Follow-up Appointments:  Follow-up Information     [name], DO. Go on 10/29/2024.   Specialty: Internal Medicine Why: at 9:15 Contact information: [address] [place] [address] [phone]                  Hospital Course by problem list:  [name] is a 69 y.o. person living with a history of ESRD on HD TTS,  HFrecEF (LVEF 50-55% on 10/12/2024 at [facility]), chronic severe HTN, pAFib (off anticoagulation due to prior GI bleed), chronic hypoxic respiratory failure on 3 L [place], OSA, T2DM, prior CVA, history of DVT, gastric antral vascular ectasia, and chronic abdominal pain who presented with abdominal pain and SOB and admitted for medical work up and dialysis now being discharged on hospital day 2 with the following pertinent hospital course:   Abdominal Pain Hx PUD Hx GERD Per chart, she has presented to the ED for abdominal pain related to hypervolemia in the past. 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 She described this abdominal pain being different, sharper and seems to be more episodic. Not associated with meals. Bowels had been normal. Fecal occult negative. Abdominal exam benign with vague diffuse  tenderness, lipase and LFTs wnl. Alk phos chronically elevated likely due to ESRD and related bone disease. CTAP w/o acute findings, she does have cholelithiasis and sigmoid diverticulosis. Ddx also included abdominal pain due to volume overload after an insufficient dialysis session, however did not improve with HD. RUQ showed GB with sludge. CTA abdomen and pelvis with and without contrast negative for mesenteric ischemia or visceral arterial abnormality, although MRA can be considered if still working differential later down the line. Patient's abdominal pain did not improve after significant bowel movements on bowel regimen. Patient was previously worked up for similar epigastric pain in 2017. She has known GERD and peptic ulcer disease, had a normal EGD and colonoscopy on 01/16/2016, was on Bentyl  previously for symptom control. Tried bentyl  without symptom improvement per patient. Discussed with patient that chronic opioid use can worsen gastric motility and may worsen PUD or constipation symptoms in the long term. Patient could also not complete PUD workup due to protonix  use. Dced protonix  at dc and restarted  famotidine . Encouraged follow up with provider with referrals to GI and General surgery for GB removal. Encouraged follow up with pain clinic.    ESRD on HD TTS HTN Chronic Normocytic Anemia  HFrecEF Left forearm AVF, she makes urine. Did not complete HD session on day of admission due to sudden onset abdominal pain. Systolics 190s. On exam she has JVD to the mandible and subjectively feels overloaded although no pitting edema or rales on auscultation appreciated. Stayed sating 100% on chronic 3 L [place]. Home regimen includes Lasix  80 mg on nonHD days, Carvedilol  3.125 mg daily, isosorbide  dinitrate 20 mg TID, hydralazine  75 mg TID held on HD days nephrology recently d/c her losartan . Recently admitted at [facility] for volume overload, troponins elevated with peak at 118, thought to be due to hypervolemia. Recently seen by [facility] 1/12 and weighed 165lbs then at during time of admission which is still increased from baseline several months ago. Low suspicion for HF exacerbation. Received HD x2, session one 3 L removed, session two 4 L removed. Received 1 units of PRBC during hospitalization.      Atrial Fibrillation Qtc Prolongation  NSR, Qtc 512 ms. Managed with amiodarone  200 mg daily. Not on AC due to GAVE.    Chronic pain syndrome  Follows with Dr. Fidel, has arthoplasty scheduled for 1/29. Managed with hydromorphone  2 mg Q 12 Hrs at home. Encouraged follow up with pain clinic.     Stable chronic medical conditions: Hyperlipidemia  Diabetes - A1C 5.1 MDD  Subjective Patient assessed during HD. Still endorsing unchanged abdominal pain, specifically in epigastric area. No CP, endorsed feeling volume overloaded.   Discharge Exam:   BP (!) 150/69 (BP Location: Right Arm)   Pulse 70   Temp 98.6 F (37 C) (Oral)   Resp 18   Ht 5' 6 (1.676 m)   Wt 69.9 kg   SpO2 100%   BMI 24.87 kg/m  Discharge exam:  Physical Exam Constitutional:      General: She is not in acute distress.    Appearance:  She is not ill-appearing, toxic-appearing or diaphoretic.  Cardiovascular:     Rate and Rhythm: Normal rate and regular rhythm.     Heart sounds: Murmur (systolic murmur grade 2/6 left sternal border) heard.     No gallop.  Pulmonary:     Effort: Pulmonary effort is normal.     Breath sounds: Normal breath sounds. No decreased breath sounds or wheezing.  Comments: Anterior lung fields (assessed during HD) Abdominal:     Palpations: Abdomen is soft.     Tenderness: There is abdominal tenderness (epigastric and generazlied). There is no guarding.  Musculoskeletal:     Right lower leg: No tenderness. No edema.     Left lower leg: No tenderness. No edema.  Skin:    General: Skin is warm and dry.  Neurological:     Mental Status: She is alert.      Pertinent Labs, Studies, and Procedures:     Latest Ref Rng & Units 10/23/2024    5:13 AM 10/22/2024    5:38 AM 10/21/2024   12:19 AM  CBC  WBC 4.0 - 10.5 K/uL 3.8  4.2  5.2   Hemoglobin 12.0 - 15.0 g/dL 8.2  8.2  7.4   Hematocrit 36.0 - 46.0 % 25.4  25.5  23.6   Platelets 150 - 400 K/uL 172  165  184        Latest Ref Rng & Units 10/23/2024    5:13 AM 10/22/2024    5:38 AM 10/21/2024   12:19 AM  CMP  Glucose 70 - 99 mg/dL 82  858  89   BUN 8 - 23 mg/dL 46  27  39   Creatinine 0.44 - 1.00 mg/dL 2.26  4.71  2.42   Sodium 135 - 145 mmol/L 132  132  137   Potassium 3.5 - 5.1 mmol/L 5.6  3.9  5.2   Chloride 98 - 111 mmol/L 94  93  96   CO2 22 - 32 mmol/L 27  30  29    Calcium  8.9 - 10.3 mg/dL 9.0  8.8  9.0     CT Angio Abd/Pel w/ and/or w/o Result Date: 10/21/2024 EXAM: CTA ABDOMEN AND PELVIS WITHOUT AND WITH CONTRAST 10/21/2024 10:25:04 PM TECHNIQUE: CTA images of the abdomen and pelvis with intravenous contrast. 100 mL (iohexol  (OMNIPAQUE ) 350 MG/ML injection 100 mL IOHEXOL  350 MG/ML SOLN) was administered. Three-dimensional MIP/volume rendered formations were performed. Automated exposure control, iterative reconstruction, and/or  weight based adjustment of the mA/kV was utilized to reduce the radiation dose to as low as reasonably achievable. COMPARISON: None available. CLINICAL HISTORY: Diffuse abdominal pain, possible mesenteric ischemia. FINDINGS: VASCULATURE: AORTA: Atherosclerotic calcifications of the aorta are seen. No acute finding. No abdominal aortic aneurysm. No dissection. CELIAC TRUNK: Mild calcifications are noted at the origin. No occlusion or significant stenosis. SUPERIOR MESENTERIC ARTERY: No acute finding. No occlusion or significant stenosis. RENAL ARTERIES: No acute finding. No occlusion or significant stenosis. ILIAC ARTERIES: No acute finding. No occlusion or significant stenosis. LIVER: The liver is unremarkable. GALLBLADDER AND BILE DUCTS: Gallbladder is unremarkable. No biliary ductal dilatation. SPLEEN: A stable cyst is noted within the spleen. PANCREAS: The pancreas is unremarkable. ADRENAL GLANDS: Within normal limits. KIDNEYS, URETERS AND BLADDER: Kidneys are atrophic in appearance, similar to that seen on the prior exam. No stones in the kidneys or ureters. No hydronephrosis. No perinephric or periureteral stranding. The bladder is decompressed. GI AND BOWEL: Stomach and small bowel are unremarkable. Scattered diverticular changes of the colon are noted. No findings to suggest diverticulitis are seen. The appendix is within normal limits. No findings to suggest GI hemorrhage are noted. There is no bowel obstruction. No abnormal bowel wall thickening or distension. REPRODUCTIVE: The uterus has been surgically removed. PERITONEUM AND RETROPERITONEUM: No ascites or free air. LUNG BASE: Lung bases demonstrate mild scarring, stable from prior exams. LYMPH NODES: No lymphadenopathy. BONES AND SOFT  TISSUES: No acute bony abnormalities noted. Degenerative changes of the lumbar spine are seen. 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 No acute soft tissue abnormality. HEART AND MEDIASTINUM:  The heart is significantly enlarged in size. IMPRESSION: 1. No evidence of mesenteric ischemia or visceral arterial abnormality. 2.  Chronic changes as described above. Electronically signed by: Oneil Devonshire MD 10/21/2024 10:38 PM EST RP Workstation: [id number]   US  Abdomen Limited RUQ (LIVER/GB) Result Date: 10/21/2024 EXAM: Right Upper Quadrant Abdominal Ultrasound 10/21/2024 02:08:34 AM TECHNIQUE: Real-time ultrasonography of the right upper quadrant of the abdomen was performed. COMPARISON: None available. CLINICAL HISTORY: RUQ pain. FINDINGS: LIVER: Normal echogenicity. No intrahepatic biliary ductal dilatation. No evidence of mass. Hepatopetal flow in the portal vein. BILIARY SYSTEM: No pericholecystic fluid or wall thickening. Small amount of sludge suspected within the gallbladder, but no shadowing stones are identified. Common bile duct is within normal limits measuring 5 mm. OTHER: No right upper quadrant ascites. A small amount of right pleural fluid is seen. IMPRESSION: 1. Small amount of sludge suspected within the gallbladder, no shadowing stones identified. 2. Small amount of right pleural fluid. Electronically signed by: Franky Stanford MD 10/21/2024 02:40 AM EST RP Workstation: [id number]   CT ABDOMEN PELVIS W CONTRAST Result Date: 10/20/2024 EXAM: CT ABDOMEN AND PELVIS WITH CONTRAST 10/20/2024 08:50:00 PM TECHNIQUE: CT of the abdomen and pelvis was performed with the administration of 75 mL of iohexol  (OMNIPAQUE ) 350 MG/ML injection. Multiplanar reformatted images are provided for review. Automated exposure control, iterative reconstruction, and/or weight-based adjustment of the mA/kV was utilized to reduce the radiation dose to as low as reasonably achievable. COMPARISON: 06/09/2024. CLINICAL HISTORY: acute abdominal pain. FINDINGS: LOWER CHEST: Cardiomegaly. Small right pleural effusion. Bibasilar atelectasis. LIVER: The liver is unremarkable. GALLBLADDER AND BILE DUCTS: Tiny layering  gallstones within the gallbladder. No biliary ductal dilatation. SPLEEN: Cysts or hemangiomas in the spleen. PANCREAS: No acute abnormality. ADRENAL GLANDS: No acute abnormality. KIDNEYS, URETERS AND BLADDER: Kidneys are atrophic. Hypodense area in the lower pole of the left kidney measures up to 2.2 cm. This is stable since imaging back to 09/29/2023 and its most compatible with cyst. No stones in the kidneys or ureters. No hydronephrosis. No perinephric or periureteral stranding. Urinary bladder is unremarkable. GI AND BOWEL: Stomach demonstrates no acute abnormality. Normal appendix. Sigmoid diverticulosis. There is no bowel obstruction. PERITONEUM AND RETROPERITONEUM: No ascites. No free air. VASCULATURE: Aorta is normal in caliber. Aortic atherosclerosis. LYMPH NODES: No lymphadenopathy. REPRODUCTIVE ORGANS: No acute abnormality. 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 No acute osseous abnormality. No focal soft tissue abnormality. IMPRESSION: 1. No acute findings in the abdomen or pelvis. 2. Cholelithiasis. 3. Cardiomegaly, small right pleural effusion. 4. Sigmoid diverticulosis. Electronically signed by: Franky Crease MD 10/20/2024 09:40 PM EST RP Workstation: [id number]   DG Chest Port 1 View Result Date: 10/20/2024 CLINICAL DATA:  Shortness of breath. EXAM: PORTABLE CHEST 1 VIEW COMPARISON:  10/08/2024. FINDINGS: The heart is enlarged and the mediastinal contour is within normal limits. The pulmonary vasculature is distended. Interstitial prominence is present bilaterally with mild airspace disease at the lung bases. Possible trace bilateral pleural effusions. No pneumothorax is seen. Stable radiopaque densities are present over the left upper chest. No acute osseous abnormality. IMPRESSION: 1. Cardiomegaly with pulmonary vascular congestion. 2. Interstitial prominence bilaterally with mild airspace disease at the lung bases,  possible edema or infiltrate. 3. Possible trace bilateral pleural effusions. Electronically Signed  By: Leita Birmingham M.D.   On: 10/20/2024 15:49     Discharge Instructions: Discharge Instructions     Call MD for:  difficulty breathing, headache or visual disturbances   Complete by: As directed    Call MD for:  extreme fatigue   Complete by: As directed    Call MD for:  hives   Complete by: As directed    Call MD for:  persistant dizziness or light-headedness   Complete by: As directed    Call MD for:  persistant nausea and vomiting   Complete by: As directed    Call MD for:  severe uncontrolled pain   Complete by: As directed    Call MD for:  temperature >100.4   Complete by: As directed    Discharge instructions   Complete by: As directed    Thank you for allowing us  to be part of your care. You were hospitalized for abdominal pain and shortness of breath. You had dialysis while you were here and your abdominal pain workup with negative aside from small amounts of sludge suspected within the gallbladder. We recommend follow up with your primary care doctor and they will help place a referral for a General Surgery Consultation for gallbladder surgery. Additionally, your chronic pain medication could be worsening your stomach symptoms. I am going to recommend a referral to pain specialist for your chronic pain syndrome.   See the changes in your medications and management of your chronic conditions below:  *For your abdominal pain -We have STARTED you on these following medications:  - Bentyl  10 mg to take three times daily before your meals             - Pause your protonix , START retaking famotidine  20 mg two times daily (once in AM and once in PM) - I would encourage you to take your senna twice daily to help with bowel movements.  - I will write in your discharge summary to your primary care doctor to recommend follow up outpatient with gastroenterologist and general surgery for  further workup of abdominal pain and gallstones.   *For your Dialysis  - Remember to hold your blood pressure medications on the days you have dialysis to avoid drops in your blood pressure.  - please follow up with your outpatient dialysis center   FOLLOW UP APPOINTMENTS: We arranged for you to follow up with your primary doctor on Monday 1/26 with Dr. Tobie at 9:15   Please call your PCP or our clinic if you have any questions or concerns, we may be able to help and keep you from a long and expensive emergency room wait. Our clinic and after hours phone number is [phone]. The best time to call is Monday through Friday 9 am to 4 pm but there is always someone available 24/7 if you have an emergency. If you need medication refills please notify your pharmacy one week in advance and they will send us  a request.   We are glad you are feeling better,  Sallyanne Primas Internal Medicine Inpatient Teaching Service at [facility]   Increase activity slowly   Complete by: As directed    No wound care   Complete by: As directed        Signed: Amarie Tarte, DO 10/23/2024, 1:31 PM     "

## 2024-10-23 NOTE — Progress Notes (Signed)
 " [facility] Progress Note   Subjective:    Seen and examined patient at bedside. Tolerating UFG 4L and BP is 127/54. Opens eyes to voice and quickly goes back to sleep. She may be discharged today.  Objective Vitals:   10/23/24 0754 10/23/24 0808 10/23/24 0830 10/23/24 0931  BP: (!) 141/63 (!) 149/70 (!) 153/71 (!) 143/64  Pulse: 69 67 66 66  Resp: (!) 24 (!) 22 (!) 21 (!) 25  Temp: 98.7 F (37.1 C)     TempSrc:      SpO2: 100% 100% 100% 100%  Weight: 73.8 kg     Height:       Physical Exam General: Opens eyes to voice and appears more sleepy today; on O2, NAD Heart: RRR, no murmurs Lungs: Clear anteriorly and laterally, respirations unlabored Abdomen: Soft, non-distended, +BS Extremities: no edema b/l lower extremities Dialysis Access: LUE AVF + t/b  Filed Weights   10/22/24 0520 10/23/24 0610 10/23/24 0754  Weight: 72.6 kg 75.6 kg 73.8 kg    Intake/Output Summary (Last 24 hours) at 10/23/2024 1006 Last data filed at 10/22/2024 1800 Gross per 24 hour  Intake 840 ml  Output --  Net 840 ml    Additional Objective Labs: Basic Metabolic Panel: Recent Labs  Lab 10/21/24 0019 10/22/24 0538 10/23/24 0513  NA 137 132* 132*  K 5.2* 3.9 5.6*  CL 96* 93* 94*  CO2 29 30 27   GLUCOSE 89 141* 82  BUN 39* 27* 46*  CREATININE 7.57* 5.28* 7.73*  CALCIUM  9.0 8.8* 9.0  PHOS 4.1 2.6 4.3   Liver Function Tests: Recent Labs  Lab 10/20/24 1444 10/21/24 0019 10/22/24 0538 10/23/24 0513  AST 29  --   --   --   ALT 27  --   --   --   ALKPHOS 163*  --   --   --   BILITOT 0.4  --   --   --   PROT 7.7  --   --   --   ALBUMIN 4.0 3.5 3.6 3.6   Recent Labs  Lab 10/20/24 1444  LIPASE 32   CBC: Recent Labs  Lab 10/20/24 1444 10/21/24 0019 10/22/24 0538 10/23/24 0513  WBC 5.3 5.2 4.2 3.8*  HGB 8.4* 7.4* 8.2* 8.2*  HCT 26.6* 23.6* 25.5* 25.4*  MCV 84.7 85.8 84.7 84.4  PLT 182 184 165 172   Blood Culture    Component Value Date/Time   SDES BLOOD  RIGHT FOREARM 12/02/2022 1710   SPECREQUEST  12/02/2022 1710    BOTTLES DRAWN AEROBIC AND ANAEROBIC Blood Culture results may not be optimal due to an inadequate volume of blood received in culture bottles   CULT  12/02/2022 1710    NO GROWTH 5 DAYS Performed at [facility], [address], [place], [place] [address]    REPTSTATUS 12/07/2022 FINAL 12/02/2022 1710    Cardiac Enzymes: No results for input(s): CKTOTAL, CKMB, CKMBINDEX, TROPONINI in the last 168 hours. CBG: Recent Labs  Lab 10/21/24 1719 10/21/24 2118 10/22/24 0745  GLUCAP 92 83 121*   Iron  Studies: No results for input(s): IRON , TIBC, TRANSFERRIN, FERRITIN in the last 72 hours. Lab Results  Component Value Date   INR 1.3 (H) 10/29/2023   INR 1.6 (H) 01/30/2023   INR 2.99 (H) 11/03/2015   Studies/Results: CT Angio Abd/Pel w/ and/or w/o Result Date: 10/21/2024 EXAM: CTA ABDOMEN AND PELVIS WITHOUT AND WITH CONTRAST 10/21/2024 10:25:04 PM TECHNIQUE: CTA images of the  abdomen and pelvis with intravenous contrast. 100 mL (iohexol  (OMNIPAQUE ) 350 MG/ML injection 100 mL IOHEXOL  350 MG/ML SOLN) was administered. Three-dimensional MIP/volume rendered formations were performed. Automated exposure control, iterative reconstruction, and/or weight based adjustment of the mA/kV was utilized to reduce the radiation dose to as low as reasonably achievable. COMPARISON: None available. CLINICAL HISTORY: Diffuse abdominal pain, possible mesenteric ischemia. FINDINGS: VASCULATURE: AORTA: Atherosclerotic calcifications of the aorta are seen. No acute finding. No abdominal aortic aneurysm. No dissection. CELIAC TRUNK: Mild calcifications are noted at the origin. No occlusion or significant stenosis. SUPERIOR MESENTERIC ARTERY: No acute finding. No occlusion or significant stenosis. RENAL ARTERIES: No acute finding. No occlusion or significant stenosis. ILIAC ARTERIES: No acute finding. No occlusion or significant stenosis.  LIVER: The liver is unremarkable. GALLBLADDER AND BILE DUCTS: Gallbladder is unremarkable. No biliary ductal dilatation. SPLEEN: A stable cyst is noted within the spleen. PANCREAS: The pancreas is unremarkable. ADRENAL GLANDS: Within normal limits. KIDNEYS, URETERS AND BLADDER: Kidneys are atrophic in appearance, similar to that seen on the prior exam. No stones in the kidneys or ureters. No hydronephrosis. No perinephric or periureteral stranding. The bladder is decompressed. GI AND BOWEL: Stomach and small bowel are unremarkable. Scattered diverticular changes of the colon are noted. No findings to suggest diverticulitis are seen. The appendix is within normal limits. No findings to suggest GI hemorrhage are noted. There is no bowel obstruction. No abnormal bowel wall thickening or distension. REPRODUCTIVE: The uterus has been surgically removed. PERITONEUM AND RETROPERITONEUM: No ascites or free air. LUNG BASE: Lung bases demonstrate mild scarring, stable from prior exams. LYMPH NODES: No lymphadenopathy. BONES AND SOFT TISSUES: No acute bony abnormalities noted. Degenerative changes of the lumbar spine are seen. 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 No acute soft tissue abnormality. HEART AND MEDIASTINUM: The heart is significantly enlarged in size. IMPRESSION: 1. No evidence of mesenteric ischemia or visceral arterial abnormality. 2.  Chronic changes as described above. Electronically signed by: Oneil Devonshire MD 10/21/2024 10:38 PM EST RP Workstation: [id number]    Medications:   amiodarone   200 mg Oral Daily   atorvastatin   40 mg Oral Daily   carvedilol   3.125 mg Oral BID WC   Chlorhexidine  Gluconate Cloth  6 each Topical Q0600   diclofenac  Sodium  4 g Topical QID   dicyclomine   10 mg Oral TID AC   Glycerin  (Adult)  1 suppository Rectal Once   hydrALAZINE   75 mg Oral TID   HYDROmorphone   2 mg Oral Q12H   isosorbide  dinitrate  20 mg Oral TID   lactose free  nutrition  237 mL Oral Daily   multivitamin  1 tablet Oral Daily   nystatin    Topical BID   pantoprazole   40 mg Oral BID   polyethylene glycol  17 g Oral Daily   senna  2 tablet Oral BID   sevelamer  carbonate  800 mg Oral TID WC    Dialysis Orders: AF-TTS: L AVF; 2K, 2 calcium  bath. EDW 69.5 kg. No heparin . Last Mircera 10/16/2024, 150 mcg. Last TSAT 34%.   Assessment/Plan: ESRD THS using LUE AVF: Received an extra HD 1/19 (4am) and 3L was removed. On HD today per her routine schedule-UFG set 4L. Reinforced fluid restrictions. She says BP has been dropping on dialysis sometimes but is generally elevated. Will follow trend and adjust meds PRN. Reminded to hold her antihypertensive medications prior to dialysis. Continue daily weights, daily renal panel, strict I/Os, avoid nephrotoxins (NSAIDs, judicious IV  Contrast) Volume overload: Chronic problem.  As above. Anemia: Hemoglobin 8.2, FOBT negative. not due for ESA, no indication for iron .  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  Resume outpatient Hectorol  3 mcg if prolonged hospitalization. Abdominal/epigastric pain: Workup thus far unrevealing.  Per primary.  May be related to volume overload. Dispo: Plan for possible discharge after HD today  Charmaine Piety, NP [facility] 10/23/2024,10:06 AM  LOS: 2 days    "

## 2024-10-23 NOTE — Discharge Instructions (Signed)
 Thank you for allowing us  to be part of your care. You were hospitalized for abdominal pain and shortness of breath. You had dialysis while you were here and your abdominal pain workup with negative aside from small amounts of sludge suspected within the gallbladder. We recommend follow up with your primary care doctor and they will help place a referral for a General Surgery Consultation for gallbladder surgery. Additionally, your chronic pain medication could be worsening your stomach symptoms. I am going to recommend a referral to pain specialist for your chronic pain syndrome.   See the changes in your medications and management of your chronic conditions below:  *For your abdominal pain -We have STARTED you on these following medications:  - Bentyl  10 mg to take three times daily before your meals             - Pause your protonix , START retaking famotidine  20 mg two times daily (once in AM and once in PM) - I would encourage you to take your senna twice daily to help with bowel movements.  - I will write in your discharge summary to your primary care doctor to recommend follow up outpatient with gastroenterologist and general surgery for further workup of abdominal pain and gallstones.   *For your Dialysis  - Remember to hold your blood pressure medications on the days you have dialysis to avoid drops in your blood pressure.  - please follow up with your outpatient dialysis center   FOLLOW UP APPOINTMENTS: We arranged for you to follow up with your primary doctor on Monday 1/26 with Dr. Tobie at 9:15   Please call your PCP or our clinic if you have any questions or concerns, we may be able to help and keep you from a long and expensive emergency room wait. Our clinic and after hours phone number is [phone]. The best time to call is Monday through Friday 9 am to 4 pm but there is always someone available 24/7 if you have an emergency. If you need medication refills please notify your  pharmacy one week in advance and they will send us  a request.   We are glad you are feeling better,  Sallyanne Primas Internal Medicine Inpatient Teaching Service at [facility]

## 2024-10-23 NOTE — Plan of Care (Signed)
 Pt discharged home with no services. Discharge instructions gone over with pt prior to discharge including medication changes and follow up appointments. Pt with new medications prescribed and picked up from [facility] pharmacy. Pt also with home meds that were returned from inpatient pharmacy prior to pt discharge. Pts IV and tele monitor removed prior to discharge. Pt gathered all belongings and changed into regular clothes. Pt transported home via [facility].

## 2024-10-23 NOTE — Plan of Care (Signed)

## 2024-10-23 NOTE — Progress Notes (Addendum)
 Pt receives out-pt HD at [facility] 11:20 am chair time. Will assist as needed.   Randine Mungo Dialysis Navigator [phone]  Addendum at 2:03 pm: D/C order noted. Contacted [facility] to be advised of pt's d/c today and that pt should resume care on Thursday.

## 2024-10-23 NOTE — Progress Notes (Signed)
 Pt. Came in awake and oriented, on 3L of 02 via nasal cannula.  Consent verified and on file. Started with no complications  UF goal:4025ml Tx duration: 3.5 hours  Access used: Left AVF Access issue: None  During tx, pt. Complained of pain. See MAR  Tx completed and tolerated Pressure dressing applied. Endorsed to floor nurse Transported to room   Estanislao Auther Shope, RN Kidney Care Unit

## 2024-10-23 NOTE — Progress Notes (Signed)
" °   10/23/24 1147  Vitals  Temp 98.5 F (36.9 C)  Pulse Rate 65  Resp (!) 23  BP (!) 142/65  SpO2 100 %  O2 Device Room Air  Weight 69.9 kg  Type of Weight Post-Dialysis  Oxygen  Therapy  Patient Activity (if Appropriate) In bed  Pulse Oximetry Type Continuous  Post Treatment  Dialyzer Clearance Clear  Liters Processed 84  Fluid Removed (mL) 4000 mL  Tolerated HD Treatment Yes  AVG/AVF Arterial Site Held (minutes) 7 minutes  AVG/AVF Venous Site Held (minutes) 7 minutes    "

## 2024-10-23 NOTE — TOC CM/SW Note (Signed)
 ICM/TOC consulted for discharge transportation need. [facility] will transport c/oxygen , 3 lpm continuous via nasal cannula. Med necessity delivered to the floor. Per nephrology SW pt is to resume outpatient dialysis Thurs, 10/25/2024.

## 2024-10-23 NOTE — Plan of Care (Signed)
   Problem: Education: Goal: Knowledge of General Education information will improve Description: Including pain rating scale, medication(s)/side effects and non-pharmacologic comfort measures Outcome: Progressing   Problem: Safety: Goal: Ability to remain free from injury will improve Outcome: Progressing   Problem: Skin Integrity: Goal: Risk for impaired skin integrity will decrease Outcome: Progressing

## 2024-10-24 ENCOUNTER — Ambulatory Visit: Admitting: Student

## 2024-10-24 ENCOUNTER — Telehealth: Payer: Self-pay

## 2024-10-24 ENCOUNTER — Other Ambulatory Visit: Payer: Self-pay

## 2024-10-24 ENCOUNTER — Encounter: Payer: Self-pay | Admitting: Student

## 2024-10-24 VITALS — BP 158/74 | HR 63 | Temp 97.8°F | Ht 66.0 in | Wt 160.0 lb

## 2024-10-24 DIAGNOSIS — G8929 Other chronic pain: Secondary | ICD-10-CM | POA: Insufficient documentation

## 2024-10-24 MED ORDER — FAMOTIDINE 20 MG PO TABS: 20.0000 mg | ORAL_TABLET | Freq: Two times a day (BID) | ORAL | 3 refills | Status: AC

## 2024-10-24 NOTE — Patient Outreach (Signed)
 Patient rescheduled appointment for 11/12/24 at 10am  Orlean Fey, BSW [place]  [facility] Social Worker, [facility] [phone]

## 2024-10-24 NOTE — Patient Instructions (Signed)
 Visit Information  Thank you for taking time to visit with me today. Please don't hesitate to contact me if I can be of assistance to you before our next scheduled appointment.  Our next appointment is by telephone on 11/12/24 at 10am Please call the care guide team at [phone] if you need to cancel or reschedule your appointment.   Following is a copy of your care plan:   Goals Addressed   None     Please call the Suicide and Crisis Lifeline: 988 call the [place]  National Suicide Prevention Lifeline: 3194739594 or TTY: (626) 424-3320 TTY 412-851-3387) to talk to a trained counselor call 1-800-273-TALK (toll free, 24 hour hotline) go to [facility] [address], [place] [phone]) call 911 if you are experiencing a Mental Health or Behavioral Health Crisis or need someone to talk to.  Patient verbalized understanding of Care plan and visit instructions communicated this visit  Orlean Fey, BSW [facility]  [facility] Social Worker, [facility] [phone]
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
## 2024-10-24 NOTE — Assessment & Plan Note (Addendum)
 Patient has a past medical history of chronic abdominal pain.  Has been previously followed by GI.  Recently hospitalized for concerns of abdominal pain.  Had CT scan which did not show any evidence of mesenteric ischemia.  Patient did have right upper quadrant ultrasound which showed gallbladder sludge but no stones.  Patient describes it to be a stabbing type sensation in her epigastric region.  Not associated with food or any other medications.  It is constant.  Does not wax and wane.  On exam, no tenderness.  Patient has no nausea.  No vomiting.  Normoactive bowel sounds.  I think this is likely functional in nature.  Do query GERD.  Will test for H. pylori.  Patient has been on Protonix , but just stopped for H. pylori testing.  Will need to wait 2 weeks.  Patient to come back in 2 weeks for H. pylori test.  EGD back in 2022 showing gastritis.  Patient also has a history of GAVE.  Patient had colonoscopy in September 2023 with some internal hemorrhoids.  Plan: - Follow-up in 2 weeks for H. pylori test - Continue famotidine  - Patient referred to GI - Patient did have gallbladder sludge, but symptoms did not correlate with meals or with periodic colic, we will forego referral to general surgery

## 2024-10-24 NOTE — Transitions of Care (Post Inpatient/ED Visit) (Signed)
" ° °  10/24/2024  Name: Audrey Hill MRN: [id number] DOB: [birth year]  Today's TOC FU Call Status: Today's TOC FU Call Status:: Unsuccessful Call (1st Attempt) Unsuccessful Call (1st Attempt) Date: 10/24/24  Attempted to reach the patient regarding the most recent Inpatient/ED visit.  Follow Up Plan: Additional outreach attempts will be made to reach the patient to complete the Transitions of Care (Post Inpatient/ED visit) call.   Shona Prow RN, CCM [place]  [facility]BC[facility]Population Health RN Care Manager [phone]  "

## 2024-10-24 NOTE — Patient Instructions (Addendum)
 Audrey Hill, Thank you for allowing me to take part in your care today.  Here are your instructions.  1.  Please follow-up in 2 weeks.  Continue taking famotidine .  2.  At that time we can do your H. pylori test.  3.  I have referred you to the gastroenterologist.   PLEASE BRING YOUR MEDICATIONS TO EVERY APPOINTMENT  Thank you, Dr. Tobie  If you have any other questions please contact the internal medicine clinic at [phone] If it is after hours, please call the [place] hospital at [phone] and then ask the person who picks up for the resident on call.

## 2024-10-25 NOTE — Pre-Procedure Instructions (Signed)
 Surgical Instructions   Your procedure is scheduled on November 01, 2024. Report to [facility] Main Entrance A at 10:00 A.M., then check in with the Admitting office. Any questions or running late day of surgery: call [phone]  Questions prior to your surgery date: call [phone], Monday-Friday, 8am-4pm. If you experience any cold or flu symptoms such as cough, fever, chills, shortness of breath, etc. between now and your scheduled surgery, please notify us  at the above number.     Remember:  Do not eat after midnight the night before your surgery  You may drink clear liquids until 09:00 the morning of your surgery.   Clear liquids allowed are: Water, Non-Citrus Juices (without pulp), Carbonated Beverages, Clear Tea (no milk, honey, etc.), Black Coffee Only (NO MILK, CREAM OR POWDERED CREAMER of any kind), and Gatorade.   Patient Instructions  The night before surgery:  No food after midnight. ONLY clear liquids after midnight  The day of surgery (if you do NOT have diabetes):  Drink ONE (1) Pre-Surgery Clear Ensure by 09:00 the morning of surgery. Drink in one sitting. Do not sip.  This drink was given to you during your hospital  pre-op appointment visit.  Nothing else to drink after completing the  Pre-Surgery Clear Ensure.   Take these medicines the morning of surgery with A SIP OF WATER  amiodarone  (PACERONE )  atorvastatin  (LIPITOR)  carvedilol  (COREG )  dicyclomine  (BENTYL )  famotidine  (PEPCID )  hydrALAZINE  (APRESOLINE )  HYDROmorphone  (DILAUDID )  isosorbide  dinitrate (ISORDIL )  pantoprazole  (PROTONIX )   May take these medicines IF NEEDED: albuterol  (VENTOLIN  HFA)  - please bring with you to the hospital   One week prior to surgery, STOP taking any Aspirin  (unless otherwise instructed by your surgeon) Aleve, Naproxen, Ibuprofen, Motrin, Advil, Goody's, BC's, all herbal medications, fish oil, and non-prescription vitamins. This includes your diclofenac  Sodium  (VOLTAREN ) 1 % GEL                      Do NOT Smoke (Tobacco/Vaping) for 24 hours prior to your procedure.  If you use a CPAP at night, you may bring your mask/headgear for your overnight stay.   You will be asked to remove any contacts, glasses, piercing's, hearing aid's, dentures/partials prior to surgery. Please bring cases for these items if needed.    Your surgeon will determine if you are to be admitted or discharged the same day.  Patients discharged the day of surgery will not be allowed to drive home, and someone needs to stay with them for 24 hours.  SURGICAL WAITING ROOM VISITATION Patients may have no more than 2 support people in the waiting area - these visitors may rotate.   Pre-op nurse will coordinate an appropriate time for 2 ADULT support persons, who may not rotate, to accompany patient in pre-op.  Children under the age of 4 must have an adult with them who is not the patient and must remain in the main waiting area with an adult.  If the patient needs to stay at the hospital during part of their recovery, the visitor guidelines for inpatient rooms apply.  Please refer to the [facility] website for the visitor guidelines for any additional information.   If you received a COVID test during your pre-op visit  it is requested that you wear a mask when out in public, stay away from anyone that may not be feeling well and notify your surgeon if you develop symptoms. If you have been in  contact with anyone that has tested positive in the last 10 days please notify you surgeon.      Pre-operative 4 CHG Bathing Instructions   You can play a key role in reducing the risk of infection after surgery. Your skin needs to be as free of germs as possible. You can reduce the number of germs on your skin by washing with CHG (chlorhexidine  gluconate) soap before surgery. CHG is an antiseptic soap that kills germs and continues to kill germs even after washing.   DO NOT use if you  have an allergy to chlorhexidine /CHG or antibacterial soaps. If your skin becomes reddened or irritated, stop using the CHG and notify one of our RNs at [phone].   Please shower with the CHG soap starting 4 days before surgery using the following schedule:     Please keep in mind the following:  DO NOT shave, including legs and underarms, starting the day of your first shower.   You may shave your face at any point before/day of surgery.  Place clean sheets on your bed the day you start using CHG soap. Use a clean washcloth (not used since being washed) for each shower. DO NOT sleep with pets once you start using the CHG.   CHG Shower Instructions:  Wash your face and private area with normal soap. If you choose to wash your hair, wash first with your normal shampoo.  After you use shampoo/soap, rinse your hair and body thoroughly to remove shampoo/soap residue.  Turn the water OFF and apply  bottle of CHG soap to a CLEAN washcloth.  Apply CHG soap ONLY FROM YOUR NECK DOWN TO YOUR TOES (washing for 3-5 minutes)  DO NOT use CHG soap on face, private areas, open wounds, or sores.  Pay special attention to the area where your surgery is being performed.  If you are having back surgery, having someone wash your back for you may be helpful. Wait 2 minutes after CHG soap is applied, then you may rinse off the CHG soap.  Pat dry with a clean towel  Put on clean clothes/pajamas   If you choose to wear lotion, please use ONLY the CHG-compatible lotions that are listed below.  Additional instructions for the day of surgery:  If you choose, you may shower the morning of surgery with an antibacterial soap.  DO NOT APPLY any lotions, deodorants, cologne, or perfumes.   Do not bring valuables to the hospital. [facility] is not responsible for any belongings/valuables. Do not wear nail polish, gel polish, artificial nails, or any other type of covering on natural nails (fingers and toes) Do  not wear jewelry or makeup Put on clean/comfortable clothes.  Please brush your teeth.  Ask your nurse before applying any prescription medications to the skin.     CHG Compatible Lotions   Aveeno Moisturizing lotion  Cetaphil Moisturizing Cream  Cetaphil Moisturizing Lotion  Clairol Herbal Essence Moisturizing Lotion, Dry Skin  Clairol Herbal Essence Moisturizing Lotion, Extra Dry Skin  Clairol Herbal Essence Moisturizing Lotion, Normal Skin  Curel Age Defying Therapeutic Moisturizing Lotion with Alpha Hydroxy  Curel Extreme Care Body Lotion  Curel Soothing Hands Moisturizing Hand Lotion  Curel Therapeutic Moisturizing Cream, Fragrance-Free  Curel Therapeutic Moisturizing Lotion, Fragrance-Free  Curel Therapeutic Moisturizing Lotion, Original Formula  Eucerin Daily Replenishing Lotion  Eucerin Dry Skin Therapy Plus Alpha Hydroxy Crme  Eucerin Dry Skin Therapy Plus Alpha Hydroxy Lotion  Eucerin Original Crme  Eucerin Original Lotion  Eucerin Plus  Crme Eucerin Plus Lotion  Eucerin TriLipid Replenishing Lotion  Keri Anti-Bacterial Hand Lotion  Keri Deep Conditioning Original Lotion Dry Skin Formula Softly Scented  Keri Deep Conditioning Original Lotion, Fragrance Free Sensitive Skin Formula  Keri Lotion Fast Absorbing Fragrance Free Sensitive Skin Formula  Keri Lotion Fast Absorbing Softly Scented Dry Skin Formula  Keri Original Lotion  Keri Skin Renewal Lotion Keri Silky Smooth Lotion  Keri Silky Smooth Sensitive Skin Lotion  Nivea Body Creamy Conditioning Oil  Nivea Body Extra Enriched Lotion  Nivea Body Original Lotion  Nivea Body Sheer Moisturizing Lotion Nivea Crme  Nivea Skin Firming Lotion  NutraDerm 30 Skin Lotion  NutraDerm Skin Lotion  NutraDerm Therapeutic Skin Cream  NutraDerm Therapeutic Skin Lotion  ProShield Protective Hand Cream  Provon moisturizing lotion  Please read over the following fact sheets that you were given.

## 2024-10-26 ENCOUNTER — Telehealth: Payer: Self-pay

## 2024-10-26 ENCOUNTER — Encounter (HOSPITAL_COMMUNITY)
Admission: RE | Admit: 2024-10-26 | Discharge: 2024-10-26 | Disposition: A | Source: Ambulatory Visit | Attending: Orthopedic Surgery

## 2024-10-26 ENCOUNTER — Other Ambulatory Visit: Payer: Self-pay

## 2024-10-26 ENCOUNTER — Encounter (HOSPITAL_COMMUNITY): Payer: Self-pay

## 2024-10-26 VITALS — BP 142/62 | HR 78 | Temp 98.4°F | Resp 17 | Ht 66.0 in | Wt 156.4 lb

## 2024-10-26 DIAGNOSIS — K219 Gastro-esophageal reflux disease without esophagitis: Secondary | ICD-10-CM | POA: Insufficient documentation

## 2024-10-26 DIAGNOSIS — Z01818 Encounter for other preprocedural examination: Secondary | ICD-10-CM

## 2024-10-26 DIAGNOSIS — I081 Rheumatic disorders of both mitral and tricuspid valves: Secondary | ICD-10-CM | POA: Insufficient documentation

## 2024-10-26 DIAGNOSIS — D631 Anemia in chronic kidney disease: Secondary | ICD-10-CM | POA: Insufficient documentation

## 2024-10-26 DIAGNOSIS — Z79899 Other long term (current) drug therapy: Secondary | ICD-10-CM | POA: Diagnosis not present

## 2024-10-26 DIAGNOSIS — N186 End stage renal disease: Secondary | ICD-10-CM | POA: Insufficient documentation

## 2024-10-26 DIAGNOSIS — M87052 Idiopathic aseptic necrosis of left femur: Secondary | ICD-10-CM | POA: Diagnosis not present

## 2024-10-26 DIAGNOSIS — Z96652 Presence of left artificial knee joint: Secondary | ICD-10-CM | POA: Diagnosis not present

## 2024-10-26 DIAGNOSIS — Z8673 Personal history of transient ischemic attack (TIA), and cerebral infarction without residual deficits: Secondary | ICD-10-CM | POA: Insufficient documentation

## 2024-10-26 DIAGNOSIS — Z992 Dependence on renal dialysis: Secondary | ICD-10-CM | POA: Insufficient documentation

## 2024-10-26 DIAGNOSIS — I5022 Chronic systolic (congestive) heart failure: Secondary | ICD-10-CM | POA: Insufficient documentation

## 2024-10-26 DIAGNOSIS — Z01812 Encounter for preprocedural laboratory examination: Secondary | ICD-10-CM | POA: Diagnosis not present

## 2024-10-26 DIAGNOSIS — J45909 Unspecified asthma, uncomplicated: Secondary | ICD-10-CM | POA: Diagnosis not present

## 2024-10-26 DIAGNOSIS — I429 Cardiomyopathy, unspecified: Secondary | ICD-10-CM | POA: Insufficient documentation

## 2024-10-26 DIAGNOSIS — I480 Paroxysmal atrial fibrillation: Secondary | ICD-10-CM | POA: Insufficient documentation

## 2024-10-26 DIAGNOSIS — G4733 Obstructive sleep apnea (adult) (pediatric): Secondary | ICD-10-CM | POA: Insufficient documentation

## 2024-10-26 DIAGNOSIS — I132 Hypertensive heart and chronic kidney disease with heart failure and with stage 5 chronic kidney disease, or end stage renal disease: Secondary | ICD-10-CM | POA: Diagnosis not present

## 2024-10-26 HISTORY — DX: Sleep apnea, unspecified: G47.30

## 2024-10-26 LAB — COMPREHENSIVE METABOLIC PANEL WITH GFR
ALT: 22 U/L (ref 0–44)
AST: 23 U/L (ref 15–41)
Albumin: 3.9 g/dL (ref 3.5–5.0)
Alkaline Phosphatase: 172 U/L — ABNORMAL HIGH (ref 38–126)
Anion gap: 14 (ref 5–15)
BUN: 34 mg/dL — ABNORMAL HIGH (ref 8–23)
CO2: 28 mmol/L (ref 22–32)
Calcium: 9.2 mg/dL (ref 8.9–10.3)
Chloride: 98 mmol/L (ref 98–111)
Creatinine, Ser: 6.56 mg/dL — ABNORMAL HIGH (ref 0.44–1.00)
GFR, Estimated: 6 mL/min — ABNORMAL LOW
Glucose, Bld: 157 mg/dL — ABNORMAL HIGH (ref 70–99)
Potassium: 3.9 mmol/L (ref 3.5–5.1)
Sodium: 140 mmol/L (ref 135–145)
Total Bilirubin: 0.4 mg/dL (ref 0.0–1.2)
Total Protein: 7.5 g/dL (ref 6.5–8.1)

## 2024-10-26 LAB — CBC
HCT: 30.5 % — ABNORMAL LOW (ref 36.0–46.0)
Hemoglobin: 9.6 g/dL — ABNORMAL LOW (ref 12.0–15.0)
MCH: 27 pg (ref 26.0–34.0)
MCHC: 31.5 g/dL (ref 30.0–36.0)
MCV: 85.7 fL (ref 80.0–100.0)
Platelets: 200 K/uL (ref 150–400)
RBC: 3.56 MIL/uL — ABNORMAL LOW (ref 3.87–5.11)
RDW: 19.5 % — ABNORMAL HIGH (ref 11.5–15.5)
WBC: 4.4 K/uL (ref 4.0–10.5)
nRBC: 0 % (ref 0.0–0.2)

## 2024-10-26 LAB — TYPE AND SCREEN
ABO/RH(D): O POS
Antibody Screen: NEGATIVE

## 2024-10-26 LAB — SURGICAL PCR SCREEN
MRSA, PCR: NEGATIVE
Staphylococcus aureus: POSITIVE — AB

## 2024-10-26 NOTE — Progress Notes (Addendum)
 PCP - Dr. Libby Blanch; admitted 1/17-1/20 Cardiologist - Dr. [name] [name], LOV 09/12/2024 Was admitted 1/17/-1/20 Nephrologist: Dialysis T, TH, S at [facility] [place]  PPM/ICD - denies Device Orders - na Rep Notified - na  Chest x-ray -  EKG - 08/21/2025 Stress Test - 02/20/2024 ECHO - 07/06/2024 Cardiac Cath -   Sleep Study - OSA CPAP - does not wear CPAP  Non-diabetic  Blood Thinner Instructions:denies Aspirin  Instructions:denies  ERAS Protcol - Ensure until 0900  Anesthesia review: Yes. Recent admit, HTN, home O2, A-fib, CHF, stroke, cardiomyopathy, heart murmur  Patient denies shortness of breath, fever, cough and chest pain at PAT appointment   All instructions explained to the patient, with a verbal understanding of the material. Patient agrees to go over the instructions while at home for a better understanding. Patient also instructed to self quarantine after being tested for COVID-19. The opportunity to ask questions was provided.

## 2024-10-26 NOTE — Transitions of Care (Post Inpatient/ED Visit) (Signed)
 "  10/26/2024  Name: Audrey Hill MRN: [id number] DOB: October 16, 1955  Today's TOC FU Call Status: Today's TOC FU Call Status:: Successful TOC FU Call Completed TOC FU Call Complete Date: 10/26/24  Patient's Name and Date of Birth confirmed. Name, DOB  Transition Care Management Follow-up Telephone Call Date of Discharge: 10/23/24 Discharge Facility: [facility]) Name of Other (Non-[facility]) Discharge Facility: [facility] 1/20 Type of Discharge: Inpatient Admission Primary Inpatient Discharge Diagnosis:: Hypervolemia How have you been since you were released from the hospital?: Better Any questions or concerns?: No  Items Reviewed: Did you receive and understand the discharge instructions provided?: Yes Medications obtained,verified, and reconciled?: Yes (Medications Reviewed) Any new allergies since your discharge?: No Dietary orders reviewed?: Yes Type of Diet Ordered:: Heart Healthy Do you have support at home?: Yes People in Home [RPT]: child(ren), adult Name of Support/Comfort Primary Source: Shera Poli, daughter  Medications Reviewed Today: Medications Reviewed Today     Reviewed by Moises Reusing, RN (Case Manager) on 10/26/24 at 1515  Med List Status: <None>   Medication Order Taking? Sig Documenting Provider Last Dose Status Informant  albuterol  (VENTOLIN  HFA) 108 (90 Base) MCG/ACT inhaler [id number] No INHALE 2 PUFFS BY MOUTH EVERY 6 HOURS AS NEEDED FOR COUGH [name], DO Past Week Active Self, Other, Pharmacy Records  amiodarone  (PACERONE ) 200 MG tablet [id number] No TAKE 1 TABLET BY MOUTH DAILY [name], DO 10/19/2024 Morning Active Self, Other, Pharmacy Records  atorvastatin  (LIPITOR) 40 MG tablet [id number] No TAKE 1 TABLET BY MOUTH DAILY [name], DO 10/19/2024 Morning Active Self, Other, Pharmacy Records  carvedilol  (COREG ) 3.125 MG tablet [id number]  Take 1 tablet (3.125 mg total) by mouth 2 (two) times daily with a meal. Do not take on days you have  dialysis Rihner, Emilie, DO  Active   diclofenac  Sodium (VOLTAREN ) 1 % GEL [id number] No Apply 4 g topically 4 (four) times daily.  Patient not taking: Reported on 10/20/2024   Amoako, Prince, MD Not Taking Active Self, Pharmacy Records, Other           Med Note (WHITE, DONETA GORMAN [place] Feb 29, 2024 12:35 AM) Not effective  dicyclomine  (BENTYL ) 10 MG capsule [id number]  Take 1 capsule (10 mg total) by mouth 3 (three) times daily before meals. Rihner, Emilie, DO  Active   famotidine  (PEPCID ) 20 MG tablet [id number]  Take 1 tablet (20 mg total) by mouth 2 (two) times daily. [name], DO  Active   ferrous sulfate 325 (65 FE) MG tablet [id number] No Take 325 mg by mouth every other day. [provider] Unknown Active Self, Pharmacy Records, Other           Med Note ISABEL, DONETA GORMAN   Sat Oct 20, 2024  9:38 PM) Pill packed, so not sure when it was taken  furosemide  (LASIX ) 80 MG tablet [id number] No Take 80mg  once in the morning on non-dialysis days. Napoleon Limes, MD 10/19/2024 Morning Active Self, Other, Pharmacy Records  hydrALAZINE  (APRESOLINE ) 50 MG tablet [id number]  Take 1.5 tablets (75 mg total) by mouth 3 (three) times daily. Do not take on days you have dialysis Rihner, Emilie, DO  Active   HYDROmorphone  (DILAUDID ) 2 MG tablet [id number] No Take 1 tablet (2 mg total) by mouth every 12 (twelve) hours. Amoako, Prince, MD 10/19/2024 Morning Active Self, Other, Pharmacy Records  isosorbide  dinitrate (ISORDIL ) 20 MG tablet [id number]  Take 1 tablet (20 mg total) by mouth 3 (three) times daily. Rihner, Veneta,  DO  Active   lidocaine  (LIDODERM ) 5 % [id number] No Place 1 patch onto the skin daily as needed. Remove & Discard patch within 12 hours or as directed by MD Edgardo Pontiff, DO Past Week Active Self, Other, Pharmacy Records  Methoxy PEG-Epoetin  Beta (MIRCERA IJ) [id number] No 100 mcg. [provider] Unknown Active Self, Other, Pharmacy Records           Med Note ISABEL, DONETA RAMAN   Sat  Oct 20, 2024  9:41 PM) Given at dialysis  multivitamin (RENA-VIT) TABS tablet [id number] No TAKE 1 TABLET BY MOUTH DAILY. [name], DO 10/19/2024 Morning Active Self, Other, Pharmacy Records  Nutritional Supplements (BOOST GLUCOSE CONTROL) LIQD [id number]  Take 237 mLs by mouth daily. [provider]  Active Self, Other, Pharmacy Records  pantoprazole  (PROTONIX ) 40 MG tablet [id number] No TAKE 1 TABLET BY MOUTH EVERY DAY [name], DO 10/19/2024 Morning Active Self, Other, Pharmacy Records  polyethylene glycol (MIRALAX  / GLYCOLAX ) 17 g packet [id number] No Take 17 g by mouth daily as needed for mild constipation. [provider] Unknown Active Self, Pharmacy Records, Other           Med Note CHRISTIE, JEANNETTA   Mon Jan 09, 2024  9:45 AM)    senna (SENOKOT) 8.6 MG TABS tablet [id number] No Take 2 tablets by mouth daily as needed for mild constipation. [provider] Unknown Active Self, Pharmacy Records, Other  sevelamer  carbonate (RENVELA ) 800 MG tablet [id number] No Take 1 tablet (800 mg total) by mouth 3 (three) times daily with meals. [name], DO 10/19/2024 Morning Active Self, Pharmacy Records, Other           Med Note ISABEL, DONETA RAMAN   Dju Oct 20, 2024  9:43 PM) Patient reported that every time she takes this, she experiences GI upset.  Med List Note (Horton, Jeannetta, CPhT 01/09/24 0935): Dialysis Tues., Thurs and Saturday            Home Care and Equipment/Supplies: Were Home Health Services Ordered?: NA Any new equipment or medical supplies ordered?: NA  Functional Questionnaire: Do you need assistance with bathing/showering or dressing?: No Do you need assistance with meal preparation?: No Do you need assistance with eating?: No Do you have difficulty maintaining continence: No Do you need assistance with getting out of bed/getting out of a chair/moving?: No Do you have difficulty managing or taking your medications?: No  Follow up appointments  reviewed: PCP Follow-up appointment confirmed?: Yes Date of PCP follow-up appointment?: 10/25/24 Follow-up Provider: Dr. Tobie [facility] Follow-up appointment confirmed?: NA Do you need transportation to your follow-up appointment?: No Do you understand care options if your condition(s) worsen?: Yes-patient verbalized understanding  SDOH Interventions Today    Flowsheet Row Most Recent Value  SDOH Interventions   Food Insecurity Interventions Community Resources Provided  Housing Interventions Intervention Not Indicated  Transportation Interventions Intervention Not Indicated  Utilities Interventions Intervention Not Indicated    Medford Balboa, BSN, RN [place]  VBCI - Population Health RN Care Manager [phone]  "

## 2024-10-29 ENCOUNTER — Inpatient Hospital Stay: Admitting: Student

## 2024-10-29 ENCOUNTER — Ambulatory Visit: Payer: Self-pay
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
## 2024-10-30 NOTE — Anesthesia Preprocedure Evaluation (Signed)
"                                    Anesthesia Evaluation    Airway        Dental   Pulmonary former smoker          Cardiovascular hypertension,      Neuro/Psych    GI/Hepatic   Endo/Other    Renal/GU      Musculoskeletal   Abdominal   Peds  Hematology   Anesthesia Other Findings   Reproductive/Obstetrics                              Anesthesia Physical Anesthesia Plan  ASA:   Anesthesia Plan:    Post-op Pain Management:    Induction:   PONV Risk Score and Plan:   Airway Management Planned:   Additional Equipment:   Intra-op Plan:   Post-operative Plan:   Informed Consent:   Plan Discussed with:   Anesthesia Plan Comments: (PAT note written 10/30/2024 by Daxtyn Rottenberg, PA-C.  )        Anesthesia Quick Evaluation  "

## 2024-10-31 ENCOUNTER — Other Ambulatory Visit (HOSPITAL_COMMUNITY)
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
## 2024-11-01 ENCOUNTER — Ambulatory Visit (HOSPITAL_COMMUNITY): Admission: RE | Admit: 2024-11-01 | Source: Home / Self Care | Admitting: Orthopedic Surgery

## 2024-11-01 ENCOUNTER — Encounter (HOSPITAL_COMMUNITY): Payer: Self-pay | Admitting: Vascular Surgery

## 2024-11-01 ENCOUNTER — Encounter (HOSPITAL_COMMUNITY): Admission: RE | Payer: Self-pay | Source: Home / Self Care

## 2024-11-02 ENCOUNTER — Emergency Department (HOSPITAL_COMMUNITY)

## 2024-11-02 ENCOUNTER — Encounter (HOSPITAL_COMMUNITY): Payer: Self-pay

## 2024-11-02 ENCOUNTER — Other Ambulatory Visit: Payer: Self-pay

## 2024-11-02 ENCOUNTER — Emergency Department (HOSPITAL_COMMUNITY)
Admission: EM | Admit: 2024-11-02 | Discharge: 2024-11-02 | Disposition: A | Discharge status: Home / Self Care | Attending: Emergency Medicine | Admitting: Emergency Medicine

## 2024-11-02 DIAGNOSIS — I959 Hypotension, unspecified: Secondary | ICD-10-CM | POA: Diagnosis not present

## 2024-11-02 DIAGNOSIS — Z992 Dependence on renal dialysis: Secondary | ICD-10-CM | POA: Diagnosis not present

## 2024-11-02 DIAGNOSIS — R079 Chest pain, unspecified: Secondary | ICD-10-CM | POA: Insufficient documentation

## 2024-11-02 DIAGNOSIS — M25559 Pain in unspecified hip: Secondary | ICD-10-CM | POA: Insufficient documentation

## 2024-11-02 DIAGNOSIS — N186 End stage renal disease: Secondary | ICD-10-CM | POA: Insufficient documentation

## 2024-11-02 DIAGNOSIS — R55 Syncope and collapse: Secondary | ICD-10-CM | POA: Diagnosis not present

## 2024-11-02 LAB — CBC
HCT: 30.1 % — ABNORMAL LOW (ref 36.0–46.0)
Hemoglobin: 9.5 g/dL — ABNORMAL LOW (ref 12.0–15.0)
MCH: 27.1 pg (ref 26.0–34.0)
MCHC: 31.6 g/dL (ref 30.0–36.0)
MCV: 86 fL (ref 80.0–100.0)
Platelets: 145 10*3/uL — ABNORMAL LOW (ref 150–400)
RBC: 3.5 MIL/uL — ABNORMAL LOW (ref 3.87–5.11)
RDW: 19.5 % — ABNORMAL HIGH (ref 11.5–15.5)
WBC: 4.1 10*3/uL (ref 4.0–10.5)
nRBC: 0 % (ref 0.0–0.2)

## 2024-11-02 LAB — TROPONIN T, HIGH SENSITIVITY: Troponin T High Sensitivity: 100 ng/L (ref 0–19)

## 2024-11-02 LAB — BASIC METABOLIC PANEL WITH GFR
Anion gap: 11 (ref 5–15)
BUN: 15 mg/dL (ref 8–23)
CO2: 30 mmol/L (ref 22–32)
Calcium: 8.6 mg/dL — ABNORMAL LOW (ref 8.9–10.3)
Chloride: 100 mmol/L (ref 98–111)
Creatinine, Ser: 3.65 mg/dL — ABNORMAL HIGH (ref 0.44–1.00)
GFR, Estimated: 13 mL/min — ABNORMAL LOW
Glucose, Bld: 73 mg/dL (ref 70–99)
Potassium: 4.1 mmol/L (ref 3.5–5.1)
Sodium: 141 mmol/L (ref 135–145)

## 2024-11-02 LAB — MAGNESIUM: Magnesium: 2 mg/dL (ref 1.7–2.4)

## 2024-11-02 MED ORDER — SODIUM CHLORIDE 0.9 % IV BOLUS
500.0000 mL | Freq: Once | INTRAVENOUS | Status: AC
  Administered 2024-11-02: 500 mL via INTRAVENOUS

## 2024-11-02 MED ORDER — HYDROMORPHONE HCL 2 MG PO TABS
2.0000 mg | ORAL_TABLET | Freq: Once | ORAL | Status: AC
  Administered 2024-11-02: 2 mg via ORAL
  Filled 2024-11-02: qty 1

## 2024-11-02 NOTE — ED Triage Notes (Signed)
 PT BIB [facility] from dialysis after PT experienced SOB and CP, became hypotensive after 3.5 hours of dialysis treatment. Initial pressure was 70/40, received 500ml of NS from facility and with EMS en route. Given 4mg  of zofran . Pressure rose to 102/50, pt aox4 still endorsing 6/10 CP.   EMS VS: 95% Spo2 RA, BP 102/50, HR 70's,

## 2024-11-02 NOTE — ED Notes (Signed)
 [facility] called for transport.

## 2024-11-02 NOTE — ED Notes (Signed)
 Unsuccessful blood draw attempt x2. Primary RN made aware.

## 2024-11-02 NOTE — ED Notes (Signed)
[facility] here for transport. 

## 2024-11-02 NOTE — ED Provider Notes (Signed)
 " [place] [facility] Provider Note   CSN: [id number] Arrival date & time: 11/02/24  1459     Patient presents with: Hypotension   Audrey Hill is a 69 y.o. female with a history of end-stage renal disease, high blood pressure, chronic pain, reflux, presented to the ED with complaint of chest pain and epigastric pain.  Patient reportedly had an extra session of dialysis this week to make up for the snowstorm last weekend and anticipate a snowstorm this weekend.  She was at dialysis today and completed 3-1/2 out of the 4 hours.  Towards the end of her session her blood pressure reportedly dropped low to 70/40 according to EMS, the patient was complaining of chest pain, feeling lightheaded and nauseated.  She received a total of 500 cc of IV fluids between her facility and EMS.  The patient feels that too much fluid was drawn off of her and says they change her protocol today.  She reports that she has had issues with chest pain on and off for some time.  This feels more intense than normal.  Her symptoms are currently minimal, but she still does have some lightheadedness and chest pressure  I did review the patient's medical history.  She has a history of chronic pain syndrome.  She was hospitalized and discharged approximately 10 days ago at that time for peptic ulcer disease.  She has extensive allergies and is on Dilaudid  at home 2 mg  She is also on hydralazine  75 mg 3 times daily, Imdur 20 mg TID, amiodarone  200 mg daily  She has a history of chronic hypoxic respiratory failure on 2 to 3 L nasal cannula baseline, paroxysmal A-fib on amiodarone , off of anticoagulation due to prior GI bleed, history of DVT, gastric antral vascular ectasia, type 2 diabetes, heart failure, per chart review   HPI     Prior to Admission medications  Medication Sig Start Date End Date Taking? Authorizing Provider  albuterol  (VENTOLIN  HFA) 108 (90 Base) MCG/ACT  inhaler INHALE 2 PUFFS BY MOUTH EVERY 6 HOURS AS NEEDED FOR COUGH 03/13/24   [name], DO  amiodarone  (PACERONE ) 200 MG tablet TAKE 1 TABLET BY MOUTH DAILY 09/11/24   [name], DO  atorvastatin  (LIPITOR) 40 MG tablet TAKE 1 TABLET BY MOUTH DAILY 08/09/24   [name], DO  carvedilol  (COREG ) 3.125 MG tablet Take 1 tablet (3.125 mg total) by mouth 2 (two) times daily with a meal. Do not take on days you have dialysis 10/23/24 01/21/25  Rihner, Emilie, DO  diclofenac  Sodium (VOLTAREN ) 1 % GEL Apply 4 g topically 4 (four) times daily. Patient not taking: Reported on 10/20/2024 12/09/23   Amoako, Prince, MD  dicyclomine  (BENTYL ) 10 MG capsule Take 1 capsule (10 mg total) by mouth 3 (three) times daily before meals. 10/23/24   Rihner, Emilie, DO  famotidine  (PEPCID ) 20 MG tablet Take 1 tablet (20 mg total) by mouth 2 (two) times daily. 10/24/24 11/23/24  [name], DO  ferrous sulfate 325 (65 FE) MG tablet Take 325 mg by mouth every other day.    [provider]  furosemide  (LASIX ) 80 MG tablet Take 80mg  once in the morning on non-dialysis days. 10/15/24   Smucker, Melvenia, MD  hydrALAZINE  (APRESOLINE ) 50 MG tablet Take 1.5 tablets (75 mg total) by mouth 3 (three) times daily. Do not take on days you have dialysis 10/23/24   Rihner, Emilie, DO  HYDROmorphone  (DILAUDID ) 2 MG tablet Take 1  tablet (2 mg total) by mouth every 12 (twelve) hours. 10/10/24   Amoako, Prince, MD  isosorbide  dinitrate (ISORDIL ) 20 MG tablet Take 1 tablet (20 mg total) by mouth 3 (three) times daily. 10/23/24   Rihner, Emilie, DO  lidocaine  (LIDODERM ) 5 % Place 1 patch onto the skin daily as needed. Remove & Discard patch within 12 hours or as directed by MD 09/14/24   Edgardo Pontiff, DO  Methoxy PEG-Epoetin  Beta (MIRCERA IJ) 100 mcg. 04/17/24 04/16/25  [provider]  multivitamin (RENA-VIT) TABS tablet TAKE 1 TABLET BY MOUTH DAILY. 07/11/24   [name], DO  Nutritional Supplements (BOOST GLUCOSE CONTROL) LIQD Take 237  mLs by mouth daily. 08/15/24   [provider]  [Paused] pantoprazole  (PROTONIX ) 40 MG tablet TAKE 1 TABLET BY MOUTH EVERY DAY Wait to take this until your doctor or other care provider tells you to start again. 06/12/24   [name], DO  polyethylene glycol (MIRALAX  / GLYCOLAX ) 17 g packet Take 17 g by mouth daily as needed for mild constipation.    [provider]  senna (SENOKOT) 8.6 MG TABS tablet Take 2 tablets by mouth daily as needed for mild constipation. 12/19/23   [provider]  sevelamer  carbonate (RENVELA ) 800 MG tablet Take 1 tablet (800 mg total) by mouth 3 (three) times daily with meals. 02/09/24   [name], DO    Allergies: Diphenhydramine  hcl, Lisinopril, Metoclopramide , Nortriptyline, Tramadol, Nitroglycerin , Tylenol  [acetaminophen ], Ibuprofen, Norvasc  [amlodipine  besylate], and Nsaids    Review of Systems  Updated Vital Signs BP (!) 139/58   Pulse 66   Temp 98.3 F (36.8 C) (Oral)   Resp 20   Ht 5' 6 (1.676 m)   Wt 72.6 kg   SpO2 100%   BMI 25.82 kg/m   Physical Exam Constitutional:      General: She is not in acute distress. HENT:     Head: Normocephalic and atraumatic.  Eyes:     Conjunctiva/sclera: Conjunctivae normal.     Pupils: Pupils are equal, round, and reactive to light.  Cardiovascular:     Rate and Rhythm: Normal rate and regular rhythm.  Pulmonary:     Effort: Pulmonary effort is normal. No respiratory distress.  Abdominal:     General: There is no distension.     Tenderness: There is no abdominal tenderness.  Skin:    General: Skin is warm and dry.  Neurological:     General: No focal deficit present.     Mental Status: She is alert. Mental status is at baseline.  Psychiatric:        Mood and Affect: Mood normal.        Behavior: Behavior normal.     (all labs ordered are listed, but only abnormal results are displayed) Labs Reviewed  BASIC METABOLIC PANEL WITH GFR - Abnormal; Notable for the following  components:      Result Value   Creatinine, Ser 3.65 (*)    Calcium  8.6 (*)    GFR, Estimated 13 (*)    All other components within normal limits  CBC - Abnormal; Notable for the following components:   RBC 3.50 (*)    Hemoglobin 9.5 (*)    HCT 30.1 (*)    RDW 19.5 (*)    Platelets 145 (*)    All other components within normal limits  TROPONIN T, HIGH SENSITIVITY - Abnormal; Notable for the following components:   Troponin T High Sensitivity 100 (*)    All  other components within normal limits  MAGNESIUM     EKG: EKG Interpretation Date/Time:  Friday November 02 2024 15:08:29 EST Ventricular Rate:  63 PR Interval:  165 QRS Duration:  130 QT Interval:  479 QTC Calculation: 491 R Axis:   -31  Text Interpretation: Sinus rhythm Left bundle branch block No significant changes from prior tracing Oct 21 2024 Confirmed by Cottie Cough [phone]) on 11/02/2024 4:38:52 PM  Radiology: DG Chest Portable 1 View Result Date: 11/02/2024 CLINICAL DATA:  Shortness of breath and chest pain. EXAM: PORTABLE CHEST 1 VIEW COMPARISON:  10/20/2024. FINDINGS: Cardiomegaly, unchanged. Pulmonary vascular congestion is again noted. No focal consolidation, sizeable pleural effusion, or pneumothorax. Stable radiopaque densities project over the left upper chest. No acute osseous abnormality. IMPRESSION: Cardiomegaly with pulmonary vascular congestion. Electronically Signed   By: Harrietta Sherry M.D.   On: 11/02/2024 16:12     Procedures   Medications Ordered in the ED  HYDROmorphone  (DILAUDID ) tablet 2 mg (has no administration in time range)  sodium chloride  0.9 % bolus 500 mL (0 mLs Intravenous Stopped 11/02/24 2036)    Clinical Course as of 11/02/24 2054  Fri Nov 02, 2024  2014 Trop chronically elevated in this range [MT]    Clinical Course User Index [MT] Graceanne Guin, Cough PARAS, MD                                 Medical Decision Making Amount and/or Complexity of Data Reviewed Labs:  ordered. Radiology: ordered. ECG/medicine tests: ordered.  Risk Prescription drug management.   This patient presents to the ED with concern for feeling unwell, lightheadedness, chest pressure. This involves an extensive number of treatment options, and is a complaint that carries with it a high risk of complications and morbidity.  The differential diagnosis includes fluid shifts related to dialysis versus hypovolemia versus chronic chest pain or PUD versus coronary disease versus other  Co-morbidities that complicate the patient evaluation: Cardiovascular risk factors including high blood pressure, cholesterol, advanced age  Additional history obtained from EMS  External records from outside source obtained and reviewed including most recent hospital discharge summary from 10 days ago, January 20  I ordered and personally interpreted labs.  The pertinent results include: No emergent findings.  Patient has chronic anemia, chronic kidney disease, and chronically elevated troponins, but all near baseline level  I ordered imaging studies including x-ray of the chest I independently visualized and interpreted imaging which showed no emergent finding I agree with the radiologist interpretation  The patient was maintained on a cardiac monitor.  I personally viewed and interpreted the cardiac monitored which showed an underlying rhythm of: Sinus rhythm  Per my interpretation the patient's ECG shows no acute ischemia  I ordered medication including chronic pain medicine which is oral Dilaudid  as well as small fluid bolus for hypotension  I have reviewed the patients home medicines and have made adjustments as needed  Test Considered: Lower suspicion for acute PE in this clinical setting.  She has no hypoxia, no tachycardia.   After the interventions noted above, I reevaluated the patient and found that they have: improved -patient reassessed no longer complaining of chest pain, no longer  lightheaded.  She is not complaining of chronic back pain and hip pain.  She is due for her home Dilaudid   I suspect that the patient has symptoms related to overdiuresis or volume shifts during dialysis, which led to the  low blood pressure.  Her blood pressure is since drifted back towards normal.  Dispostion:  After consideration of the diagnostic results and the patients response to treatment, I feel that the patent would benefit from outpatient follow-up.      Final diagnoses:  Hip pain, unspecified laterality  Chest pain, unspecified type  Hypotension, unspecified hypotension type  Near syncope    ED Discharge Orders     None          Cottie Donnice PARAS, MD 11/02/24 2054  "

## 2024-11-03 NOTE — Progress Notes (Signed)
 Internal Medicine Clinic Attending  Case discussed with the resident at the time of the visit.  We reviewed the resident's history and exam and pertinent patient test results.  I agree with the assessment, diagnosis, and plan of care documented in the resident's note.

## 2024-11-05 ENCOUNTER — Other Ambulatory Visit

## 2024-11-05 NOTE — Progress Notes (Unsigned)
 "  11/05/2024 Name: Audrey Hill MRN: [id number] DOB: January 11, 1956  No chief complaint on file.   Audrey Hill is a 69 y.o. year old female who presented for a telephone visit.   They were referred to the pharmacist by their PCP for assistance in managing complex medication management and polypharmacy. PMH includes HTN, Afib, HFrEF, pulmonary HTN, asthma, GERD, ESRD on dialysis, HLD, MDD, chronic pain.   Subjective: Patient was last seen by Melvenia Morrison, MD, on 05/04/24 for an acute viral illness. She was given toradol  in office and instructed to increase trazodone  to 100 mg nightly for insomnia (she was prescribed a 50 mg tablet to take in addition to the 50 mg tablets included in her pill packs).   Today, patient reports doing ok. She does not have any questions or concerns about her medications at this time. She reports that the extra trazodone  50 mg nightly (100 mg total) has not helped with her insomnia. She asks if she can stop this medication, or if there are other options. She confirms that her hydralazine  has been filled in a bottle from [facility] separate from her pill packs.   Care Team: Primary Care Provider: [name], DO ; needs to be scheduled ~ Feb 2026  Medication Access/Adherence  Current Pharmacy:  [id number] - [place], [place] - [address] [place] [address] Phone: [phone] Fax: [phone]  [facility] [place]  - [name] - [address] [place] [address] Phone: [phone] Fax: [phone]  [id number] - [place], [place] - [address] [place] [address] Phone: [phone] Fax: [phone]   Patient reports affordability concerns with their medications: No  - [employer] Dual Complete Patient reports access/transportation concerns to their pharmacy: No  Patient reports adherence  concerns with their medications:  No  - reports the only medication she wants to make sure she can refill is her pain  medication  Medication Management:  Current adherence strategy: [facility] pill packs  Called [facility] to confirm what all was in her most recent pill pack - Amiodarone  200 mg daily - Atorvastatin  40 mg daily - Furosemide  80 mg daily - Isosorbide  dinitrate 20 mg TID - Losartan  50 mg daily - Pantoprazole  40 mg daily - Rena-vites - Sertraline  25 mg daily - trazodone  50 mg (reports that she is taking additional trazodone  50 mg as prescribed, 100 mg total)  [facility] also filled:  - Albuterol  inhaler - Hydralazine  25 mg (in a bottle) - written for 15 day supply (2 tabs TID) and needs to be 30 day supply for it to go in packs. Patient reports she is taking one tab (25 mg) TID. - Filled carvedilol  last month, then was put on hold (3.125 mg). Patient advised to discontinue due to hypotension on 05/02/24.  Sevelamer  last filled 11/30/23 for 30ds from [facility]  Patient reports Good adherence to medications  Hypertension/HFrEF (EF 30-35%, Grade III diastolic dysfunction with severe LVH, normal RV, bi-atrial enlargement in Oct 2022):  Current medications: hydralazine  50 mg TID (patient taking 25 mg TID), losartan  50 mg daily Medications previously tried: carvedilol  - hypotension  Patient does not have a validated, automated, upper arm home BP cuff Current blood pressure readings readings: last BP at dialysis on 05/22/24 was 142/86  Patient denies hypotensive s/sx including dizziness, lightheadedness.  Patient denies hypertensive  symptoms including headache, chest pain, shortness of breath   Objective:  BP Readings from Last 3 Encounters:  11/02/24 (!) 149/70  10/26/24 (!) 142/62  10/24/24 (!) 158/74    Lab Results  Component Value Date   HGBA1C 5.1 10/22/2024   HGBA1C 5.6 08/03/2021   HGBA1C 5.7 (H) 08/23/2011       Latest Ref Rng & Units 11/02/2024    6:45  PM 10/26/2024   11:00 AM 10/23/2024    5:13 AM  BMP  Glucose 70 - 99 mg/dL 73  842  82   BUN 8 - 23 mg/dL 15  34  46   Creatinine 0.44 - 1.00 mg/dL 6.34  3.43  2.26   Sodium 135 - 145 mmol/L 141  140  132   Potassium 3.5 - 5.1 mmol/L 4.1  3.9  5.6   Chloride 98 - 111 mmol/L 100  98  94   CO2 22 - 32 mmol/L 30  28  27    Calcium  8.9 - 10.3 mg/dL 8.6  9.2  9.0     Lab Results  Component Value Date   CHOL 113 08/05/2022   HDL 72 08/05/2022   LDLCALC 34 08/05/2022   TRIG 33 08/05/2022   CHOLHDL 1.6 08/05/2022    Medications Reviewed Today   Medications were not reviewed in this encounter       Assessment/Plan:   Heart Failure: - Currently appropriately managed. Patient is not having s/sx of hypotension since discontinuing carvedilol . She is taking a lower dose of hydralazine  than prescribed. BP at dialysis are slightly elevated. Would recommend increasing hydralazine  to 50 mg TID as prescribed. - Patient has not been able to access a blood pressure cuff and is not currently monitoring at home - Weight gain is monitored and managed at dialysis - Recommend to continue current regimen  Current medications:  ACEi/ARB/ARNI: losartan  50 mg daily SGLT2i: none - ESRD Beta blocker: discontinued carvedilol  due to hypotension Mineralocorticoid Receptor Antagonist: none- ESRD Diuretic regimen: furosemide  80 mg daily Hydral/nitrate: hydralazine  50 mg TID, isosorbide  dinitrate 20 mg TID   Medication Management: - Currently strategy sufficient to maintain appropriate adherence to prescribed medication regimen - Will collaborate with PCP to update Rx for hydralazine  so that it will be included in next pack   Patient verbalized understanding of treatment plan.    Follow Up Plan:  Pharmacist 07/23/24 PCP clinic visit needs to be scheduled ~ Feb 2026   Lorain Baseman, PharmD [facility] [phone]   "

## 2024-11-06 ENCOUNTER — Other Ambulatory Visit: Payer: Self-pay | Admitting: *Deleted

## 2024-11-06 DIAGNOSIS — M791 Myalgia, unspecified site: Secondary | ICD-10-CM

## 2024-11-06 NOTE — Telephone Encounter (Signed)
 Copied from CRM [id number]. Topic: Clinical - Medication Refill >> Nov 06, 2024 11:52 AM Zane F wrote: Patient is actively hospitalized for shortness of breath and looking to have prescription refilled before she is discharged.  Medication: HYDROmorphone  (DILAUDID ) 2 MG tablet  Has the patient contacted their pharmacy? No  This is the patient's preferred pharmacy:  [id number] - [place], [place] - [address] [place] [address] Phone: [phone] Fax: [phone]  Is this the correct pharmacy for this prescription? Yes If no, delete pharmacy and type the correct one.   Has the prescription been filled recently? No  Is the patient out of the medication? No  Has the patient been seen for an appointment in the last year OR does the patient have an upcoming appointment? Yes  Can we respond through MyChart? No, patient would like a call. Callback Number: [phone]   Agent: Please be advised that Rx refills may take up to 3 business days. We ask that you follow-up with your pharmacy.

## 2024-11-07 ENCOUNTER — Ambulatory Visit: Payer: Self-pay | Admitting: Student

## 2024-11-12 ENCOUNTER — Telehealth

## 2024-11-26 ENCOUNTER — Other Ambulatory Visit

## 2024-11-27 ENCOUNTER — Telehealth: Admitting: *Deleted
